# Patient Record
Sex: Female | Born: 1975
Health system: Southern US, Community
[De-identification: ages and names within clinical notes are randomized; demographics above are authoritative.]

## PROBLEM LIST (undated history)

## (undated) ENCOUNTER — Ambulatory Visit (HOSPITAL_COMMUNITY): Admission: EM | Payer: BC Managed Care – PPO

## (undated) DIAGNOSIS — Z8719 Personal history of other diseases of the digestive system: Secondary | ICD-10-CM

## (undated) DIAGNOSIS — D649 Anemia, unspecified: Secondary | ICD-10-CM

## (undated) DIAGNOSIS — R7881 Bacteremia: Secondary | ICD-10-CM

## (undated) DIAGNOSIS — R011 Cardiac murmur, unspecified: Secondary | ICD-10-CM

## (undated) DIAGNOSIS — D759 Disease of blood and blood-forming organs, unspecified: Secondary | ICD-10-CM

## (undated) DIAGNOSIS — Z972 Presence of dental prosthetic device (complete) (partial): Secondary | ICD-10-CM

## (undated) DIAGNOSIS — J45909 Unspecified asthma, uncomplicated: Secondary | ICD-10-CM

## (undated) DIAGNOSIS — N159 Renal tubulo-interstitial disease, unspecified: Secondary | ICD-10-CM

## (undated) DIAGNOSIS — K219 Gastro-esophageal reflux disease without esophagitis: Secondary | ICD-10-CM

## (undated) DIAGNOSIS — I10 Essential (primary) hypertension: Secondary | ICD-10-CM

## (undated) DIAGNOSIS — N12 Tubulo-interstitial nephritis, not specified as acute or chronic: Secondary | ICD-10-CM

## (undated) HISTORY — PX: ABDOMINAL HERNIA REPAIR: SHX539

---

## 1999-12-07 ENCOUNTER — Inpatient Hospital Stay (HOSPITAL_COMMUNITY): Admission: EM | Admit: 1999-12-07 | Discharge: 1999-12-07 | Payer: Self-pay | Admitting: Obstetrics & Gynecology

## 2000-08-08 ENCOUNTER — Emergency Department (HOSPITAL_COMMUNITY): Admission: EM | Admit: 2000-08-08 | Discharge: 2000-08-08 | Payer: Self-pay | Admitting: Emergency Medicine

## 2000-08-14 ENCOUNTER — Emergency Department (HOSPITAL_COMMUNITY): Admission: EM | Admit: 2000-08-14 | Discharge: 2000-08-14 | Payer: Self-pay | Admitting: Emergency Medicine

## 2000-09-07 ENCOUNTER — Emergency Department (HOSPITAL_COMMUNITY): Admission: EM | Admit: 2000-09-07 | Discharge: 2000-09-07 | Payer: Self-pay

## 2000-10-24 ENCOUNTER — Emergency Department (HOSPITAL_COMMUNITY): Admission: EM | Admit: 2000-10-24 | Discharge: 2000-10-24 | Payer: Self-pay | Admitting: Emergency Medicine

## 2001-02-19 ENCOUNTER — Emergency Department (HOSPITAL_COMMUNITY): Admission: EM | Admit: 2001-02-19 | Discharge: 2001-02-19 | Payer: Self-pay

## 2001-03-24 ENCOUNTER — Emergency Department (HOSPITAL_COMMUNITY): Admission: EM | Admit: 2001-03-24 | Discharge: 2001-03-24 | Payer: Self-pay

## 2001-04-22 ENCOUNTER — Encounter: Payer: Self-pay | Admitting: Internal Medicine

## 2001-04-22 ENCOUNTER — Emergency Department (HOSPITAL_COMMUNITY): Admission: EM | Admit: 2001-04-22 | Discharge: 2001-04-22 | Payer: Self-pay | Admitting: Emergency Medicine

## 2001-05-03 ENCOUNTER — Encounter: Payer: Self-pay | Admitting: Orthopedic Surgery

## 2001-05-03 ENCOUNTER — Ambulatory Visit (HOSPITAL_COMMUNITY): Admission: RE | Admit: 2001-05-03 | Discharge: 2001-05-03 | Payer: Self-pay | Admitting: Orthopedic Surgery

## 2001-05-13 ENCOUNTER — Encounter: Admission: RE | Admit: 2001-05-13 | Discharge: 2001-07-07 | Payer: Self-pay | Admitting: Orthopedic Surgery

## 2001-09-27 ENCOUNTER — Inpatient Hospital Stay (HOSPITAL_COMMUNITY): Admission: AD | Admit: 2001-09-27 | Discharge: 2001-09-27 | Payer: Self-pay | Admitting: Obstetrics and Gynecology

## 2001-10-02 ENCOUNTER — Other Ambulatory Visit: Admission: RE | Admit: 2001-10-02 | Discharge: 2001-10-02 | Payer: Self-pay | Admitting: Obstetrics and Gynecology

## 2001-10-24 ENCOUNTER — Encounter: Payer: Self-pay | Admitting: Obstetrics and Gynecology

## 2001-10-24 ENCOUNTER — Ambulatory Visit (HOSPITAL_COMMUNITY): Admission: RE | Admit: 2001-10-24 | Discharge: 2001-10-24 | Payer: Self-pay | Admitting: Obstetrics and Gynecology

## 2002-01-31 ENCOUNTER — Inpatient Hospital Stay (HOSPITAL_COMMUNITY): Admission: AD | Admit: 2002-01-31 | Discharge: 2002-01-31 | Payer: Self-pay | Admitting: Obstetrics and Gynecology

## 2002-03-03 ENCOUNTER — Encounter (HOSPITAL_COMMUNITY): Admission: RE | Admit: 2002-03-03 | Discharge: 2002-04-02 | Payer: Self-pay | Admitting: Obstetrics and Gynecology

## 2002-03-04 ENCOUNTER — Encounter: Payer: Self-pay | Admitting: Obstetrics and Gynecology

## 2002-03-23 ENCOUNTER — Encounter: Payer: Self-pay | Admitting: Obstetrics and Gynecology

## 2002-04-03 ENCOUNTER — Encounter: Payer: Self-pay | Admitting: Obstetrics and Gynecology

## 2002-04-03 ENCOUNTER — Encounter (HOSPITAL_COMMUNITY): Admission: RE | Admit: 2002-04-03 | Discharge: 2002-04-03 | Payer: Self-pay | Admitting: Obstetrics and Gynecology

## 2002-04-05 ENCOUNTER — Encounter: Payer: Self-pay | Admitting: Obstetrics and Gynecology

## 2002-04-05 ENCOUNTER — Inpatient Hospital Stay (HOSPITAL_COMMUNITY): Admission: AD | Admit: 2002-04-05 | Discharge: 2002-04-05 | Payer: Self-pay | Admitting: Obstetrics and Gynecology

## 2002-04-14 ENCOUNTER — Inpatient Hospital Stay (HOSPITAL_COMMUNITY): Admission: AD | Admit: 2002-04-14 | Discharge: 2002-04-17 | Payer: Self-pay | Admitting: Obstetrics and Gynecology

## 2002-05-04 ENCOUNTER — Encounter: Payer: Self-pay | Admitting: Emergency Medicine

## 2002-05-04 ENCOUNTER — Emergency Department (HOSPITAL_COMMUNITY): Admission: EM | Admit: 2002-05-04 | Discharge: 2002-05-04 | Payer: Self-pay | Admitting: Emergency Medicine

## 2002-05-12 ENCOUNTER — Other Ambulatory Visit: Admission: RE | Admit: 2002-05-12 | Discharge: 2002-05-12 | Payer: Self-pay | Admitting: Obstetrics and Gynecology

## 2003-03-24 ENCOUNTER — Emergency Department (HOSPITAL_COMMUNITY): Admission: EM | Admit: 2003-03-24 | Discharge: 2003-03-25 | Payer: Self-pay | Admitting: Emergency Medicine

## 2003-06-04 ENCOUNTER — Encounter: Payer: Self-pay | Admitting: General Practice

## 2003-06-04 ENCOUNTER — Encounter: Admission: RE | Admit: 2003-06-04 | Discharge: 2003-06-04 | Payer: Self-pay | Admitting: General Practice

## 2003-06-14 ENCOUNTER — Other Ambulatory Visit: Admission: RE | Admit: 2003-06-14 | Discharge: 2003-06-14 | Payer: Self-pay | Admitting: Obstetrics and Gynecology

## 2003-08-14 ENCOUNTER — Emergency Department (HOSPITAL_COMMUNITY): Admission: AD | Admit: 2003-08-14 | Discharge: 2003-08-14 | Payer: Self-pay | Admitting: Family Medicine

## 2003-10-24 ENCOUNTER — Emergency Department (HOSPITAL_COMMUNITY): Admission: EM | Admit: 2003-10-24 | Discharge: 2003-10-24 | Payer: Self-pay | Admitting: Emergency Medicine

## 2004-01-25 ENCOUNTER — Emergency Department (HOSPITAL_COMMUNITY): Admission: EM | Admit: 2004-01-25 | Discharge: 2004-01-25 | Payer: Self-pay

## 2004-06-16 ENCOUNTER — Emergency Department (HOSPITAL_COMMUNITY): Admission: EM | Admit: 2004-06-16 | Discharge: 2004-06-16 | Payer: Self-pay | Admitting: Emergency Medicine

## 2004-06-23 ENCOUNTER — Other Ambulatory Visit: Admission: RE | Admit: 2004-06-23 | Discharge: 2004-06-23 | Payer: Self-pay | Admitting: Obstetrics and Gynecology

## 2004-09-09 ENCOUNTER — Emergency Department (HOSPITAL_COMMUNITY): Admission: EM | Admit: 2004-09-09 | Discharge: 2004-09-09 | Payer: Self-pay | Admitting: Emergency Medicine

## 2004-10-31 ENCOUNTER — Emergency Department (HOSPITAL_COMMUNITY): Admission: EM | Admit: 2004-10-31 | Discharge: 2004-10-31 | Payer: Self-pay | Admitting: Emergency Medicine

## 2004-11-14 ENCOUNTER — Emergency Department (HOSPITAL_COMMUNITY): Admission: EM | Admit: 2004-11-14 | Discharge: 2004-11-14 | Payer: Self-pay | Admitting: Emergency Medicine

## 2005-01-31 ENCOUNTER — Emergency Department (HOSPITAL_COMMUNITY): Admission: EM | Admit: 2005-01-31 | Discharge: 2005-01-31 | Payer: Self-pay | Admitting: Emergency Medicine

## 2005-02-01 ENCOUNTER — Encounter: Payer: Self-pay | Admitting: Emergency Medicine

## 2005-02-01 IMAGING — US US OB COMP LESS 14 WK
1 series · 14 of 28 positions shown · non-contrast
Comparison: none

CLINICAL DATA: Lower pelvic pain. Positive beta-hCG with a quantitative beta-hCG
of 29. 

OB ULTRASOUND LESS THAN 14 WEEKS TRANSABDOMINAL AND TRANSVAGINAL:

[Series 1: unknown · 0.28mm/px · 14 of 47 slices shown]
[im 2/47]
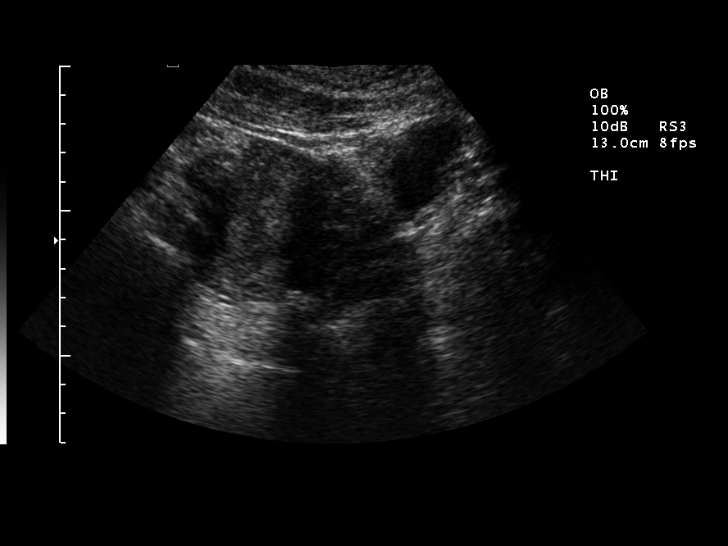
[im 6/47]
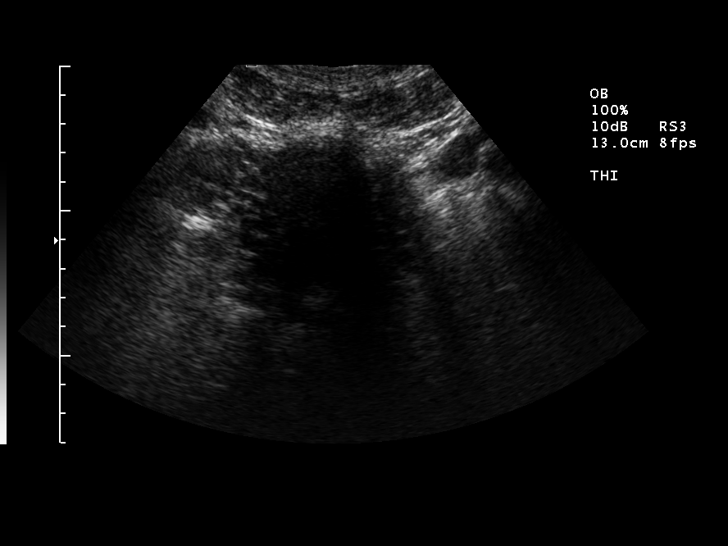
[im 9/47]
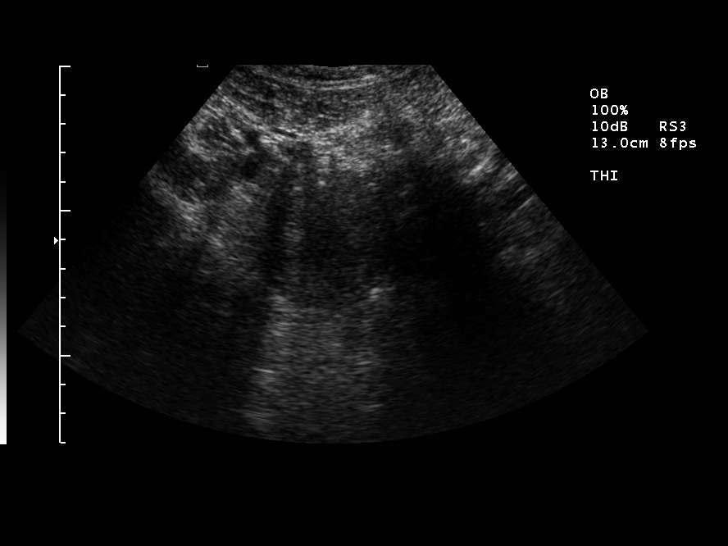
[im 12/47]
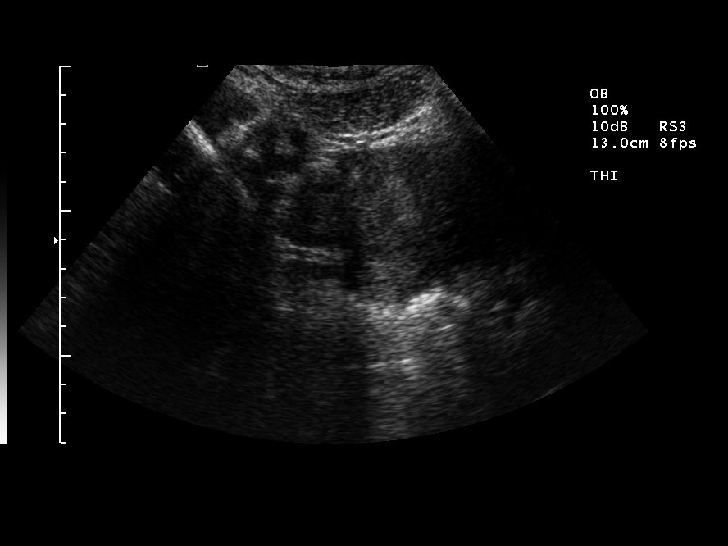
[im 16/47]
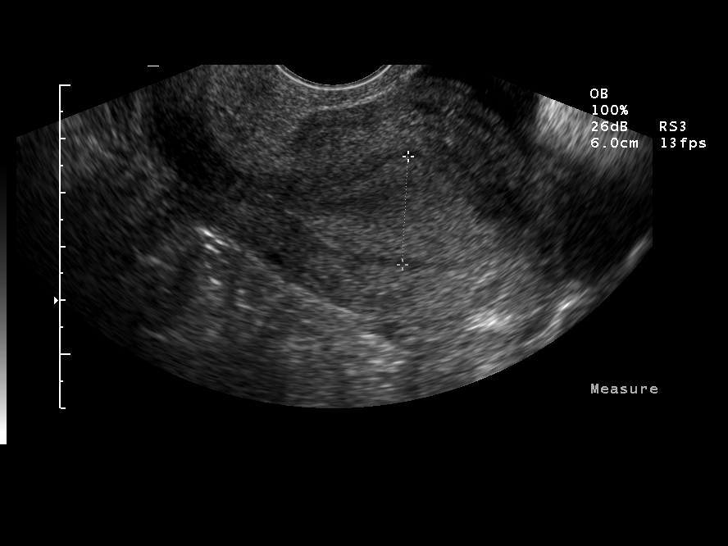
[im 19/47]
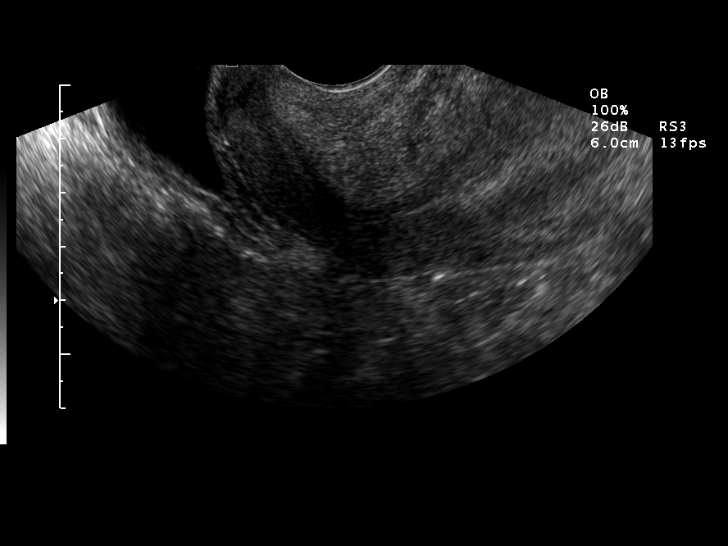
[im 23/47]
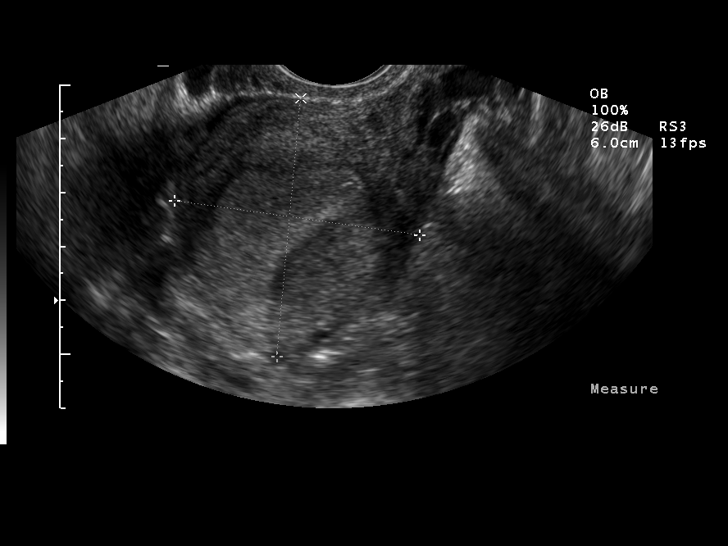
[im 26/47]
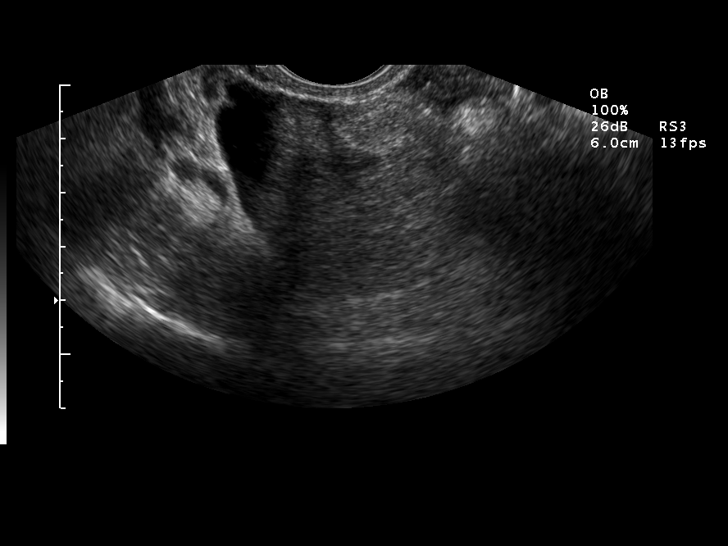
[im 29/47]
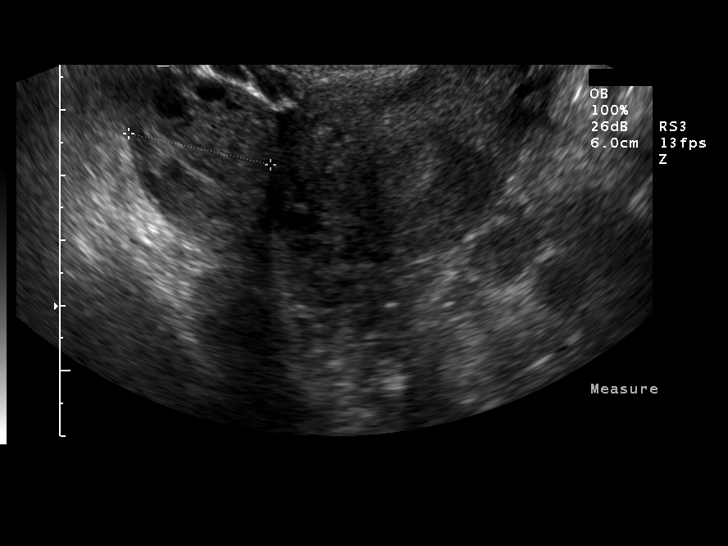
[im 33/47]
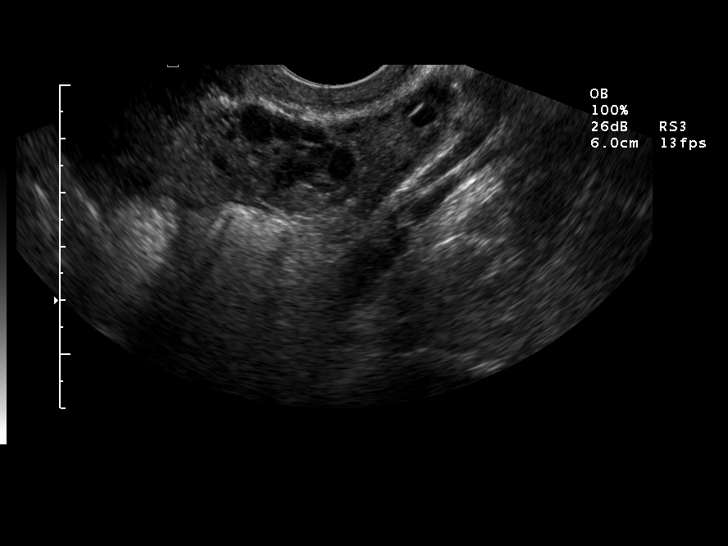
[im 36/47]
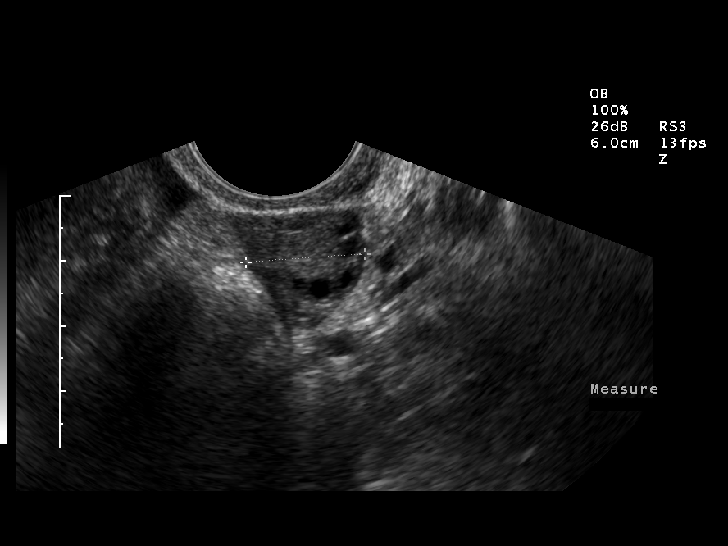
[im 40/47]
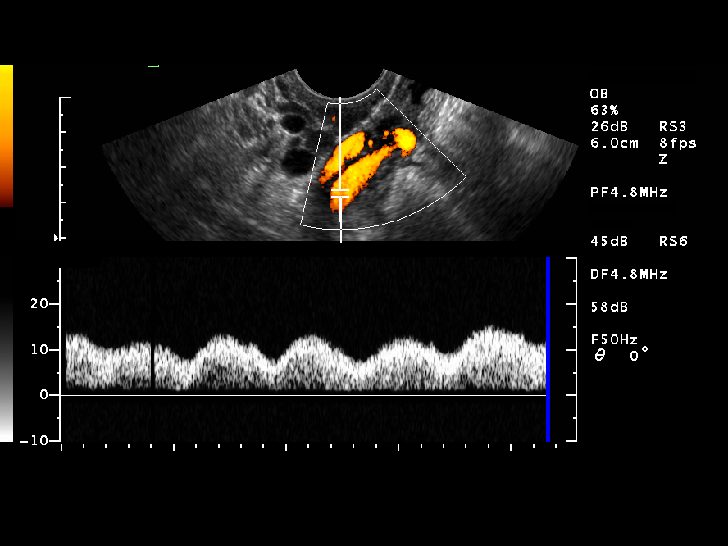
[im 43/47]
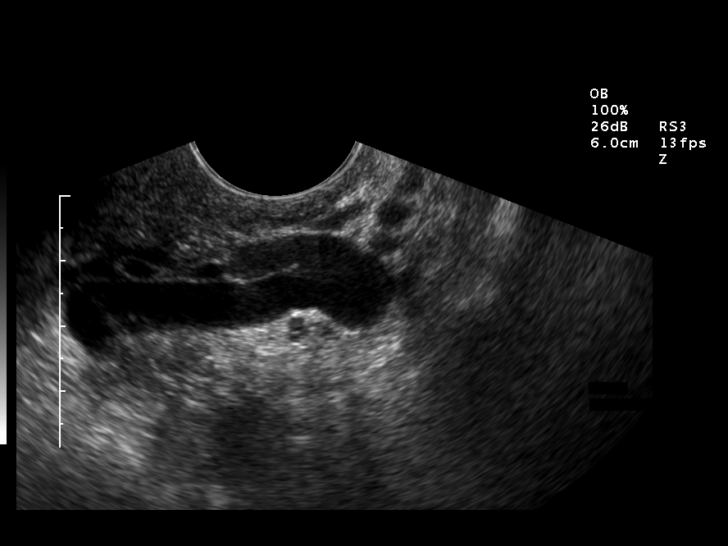
[im 47/47]
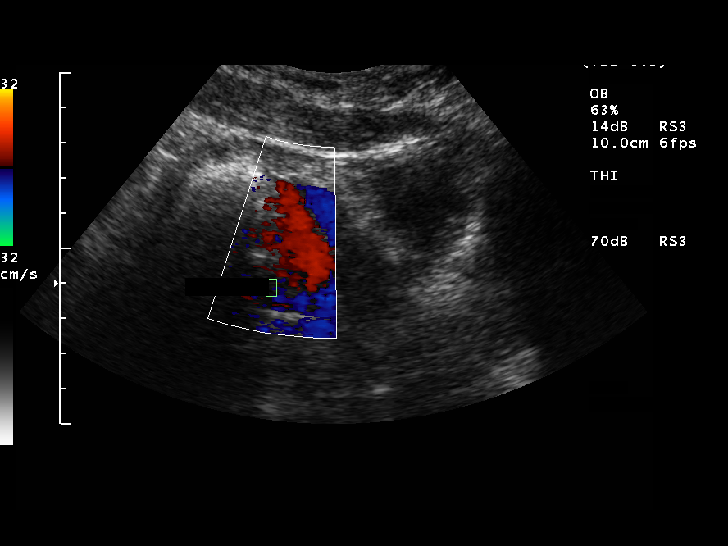

[14 of 28 positions shown; findings below may reference images not displayed]

FINDINGS: No intrauterine pregnancy identified. The endometrium is thickened at
20 mm.

Both ovaries are unremarkable. Within the left adnexal region, there are dilated
venous structures. There is reversal of flow within these vessels and increased
flow during Valsalva.

A small amount of free fluid is noted in the pelvis.
IMPRESSION: No intrauterine pregnancy identified. Prominent endometrium at 20 mm. Given the
very low quantitative beta-hCG, pregnancy may be too early to visualize
sonographically. Recommend close interval followup with serial beta-hCG's and
ultrasounds.

Dilated left paraovarian veins. These increase in size and show reversal flow on
Valsalva, compatible with varicocele, suggesting the possibility of pelvic
congestion syndrome.

## 2005-02-04 ENCOUNTER — Inpatient Hospital Stay (HOSPITAL_COMMUNITY): Admission: AD | Admit: 2005-02-04 | Discharge: 2005-02-04 | Payer: Self-pay | Admitting: Obstetrics and Gynecology

## 2005-03-05 ENCOUNTER — Other Ambulatory Visit: Admission: RE | Admit: 2005-03-05 | Discharge: 2005-03-05 | Payer: Self-pay | Admitting: Obstetrics and Gynecology

## 2005-08-29 ENCOUNTER — Ambulatory Visit (HOSPITAL_COMMUNITY): Admission: AD | Admit: 2005-08-29 | Discharge: 2005-08-29 | Payer: Self-pay | Admitting: Obstetrics and Gynecology

## 2005-08-29 IMAGING — US US FETAL BPP W/O NONSTRESS
1 series · 14 of 18 positions shown · non-contrast
Comparison: none

CLINICAL DATA: Non-reactive NST in office today.

[Series 1: us fetal bpp w/o nonstress · 0.29mm/px · 14 of 18 slices shown]
[im 1/18]
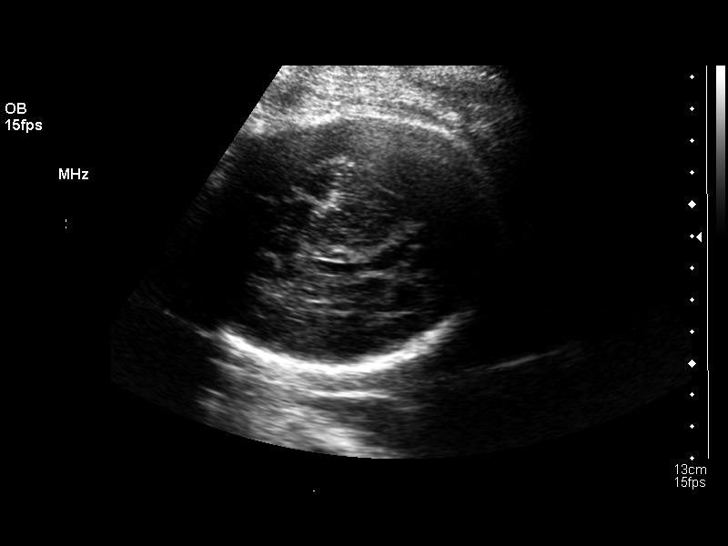
[im 2/18]
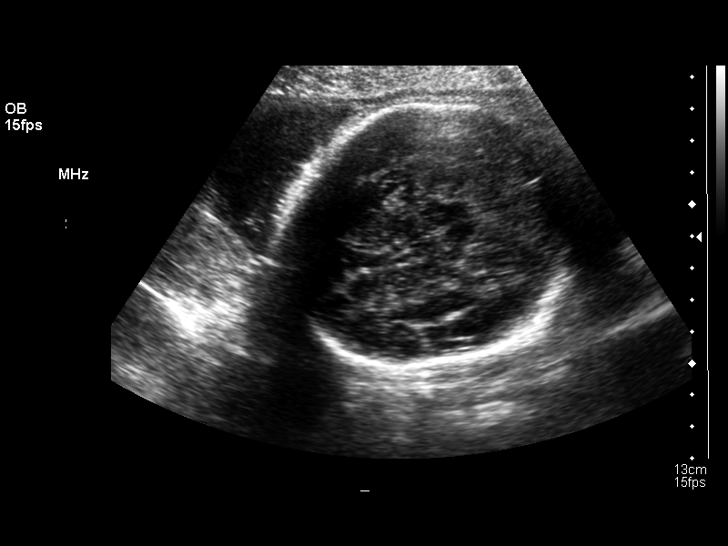
[im 4/18]
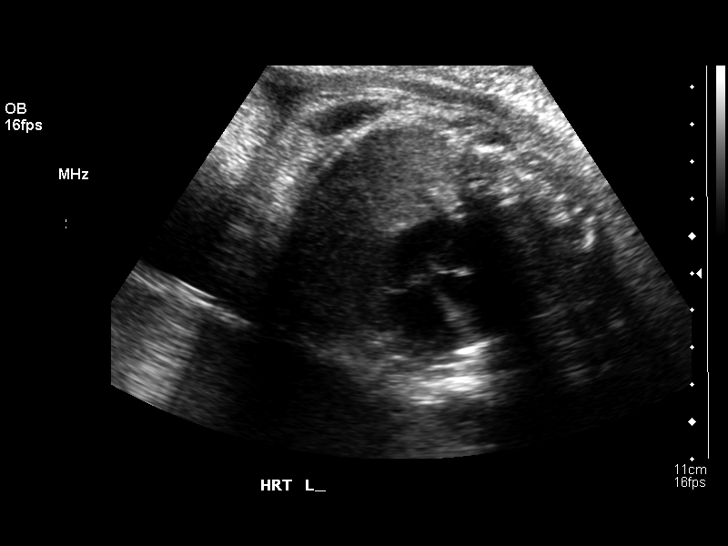
[im 5/18]
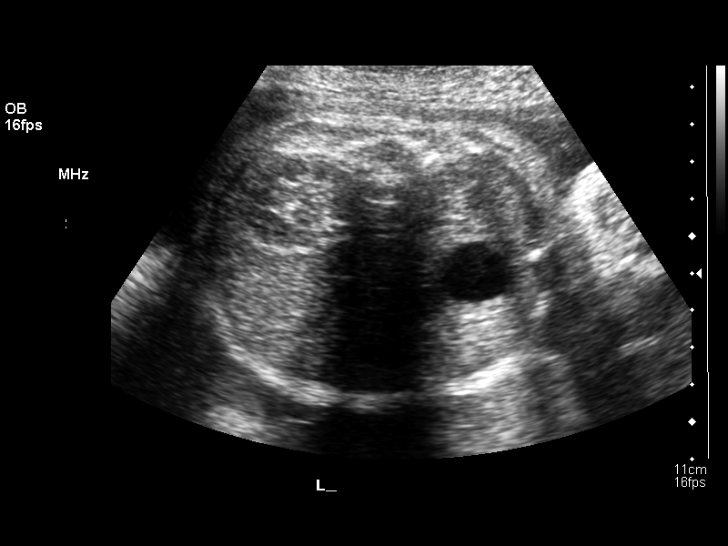
[im 6/18]
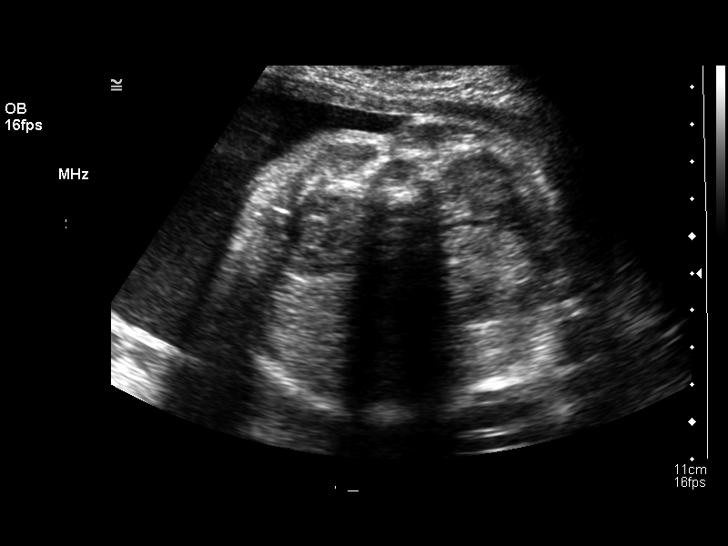
[im 8/18]
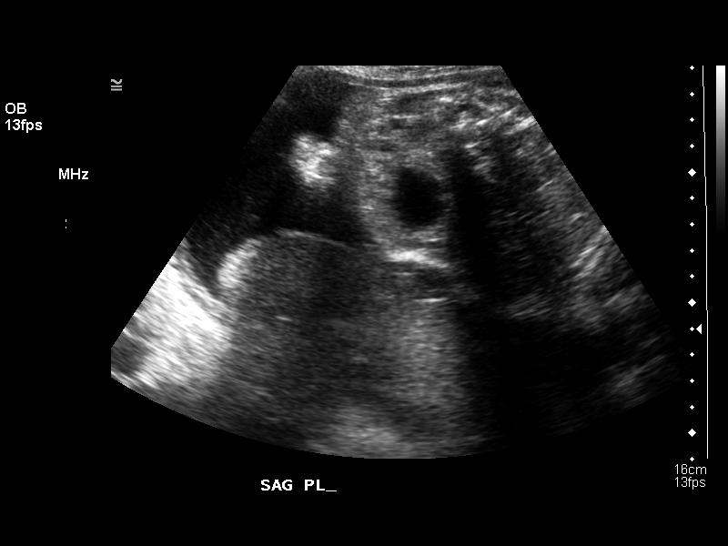
[im 9/18]
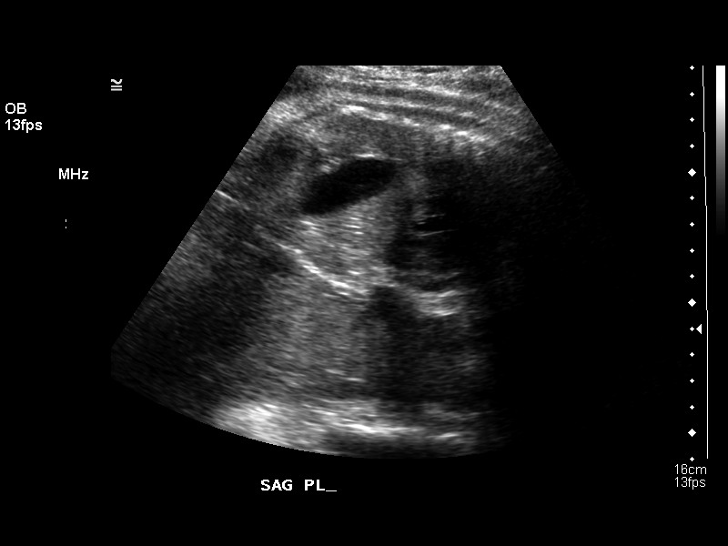
[im 10/18]
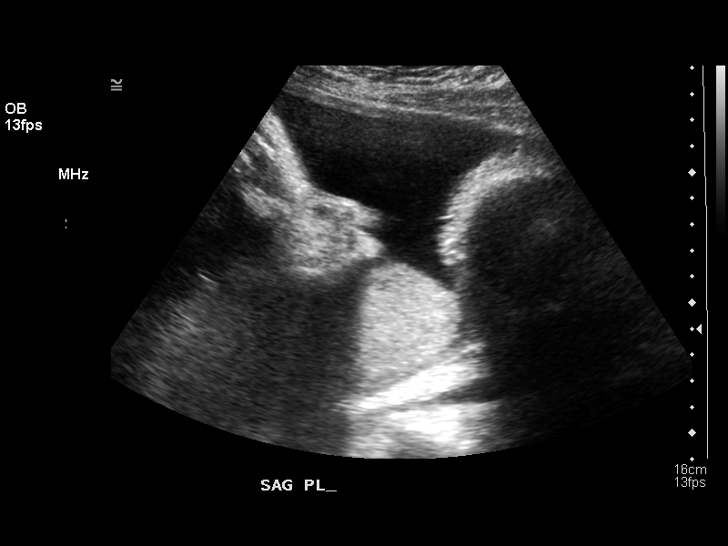
[im 11/18]
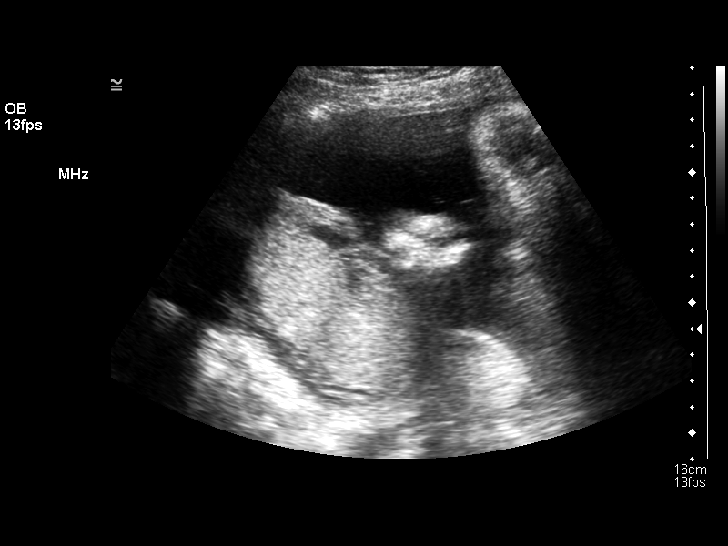
[im 13/18]
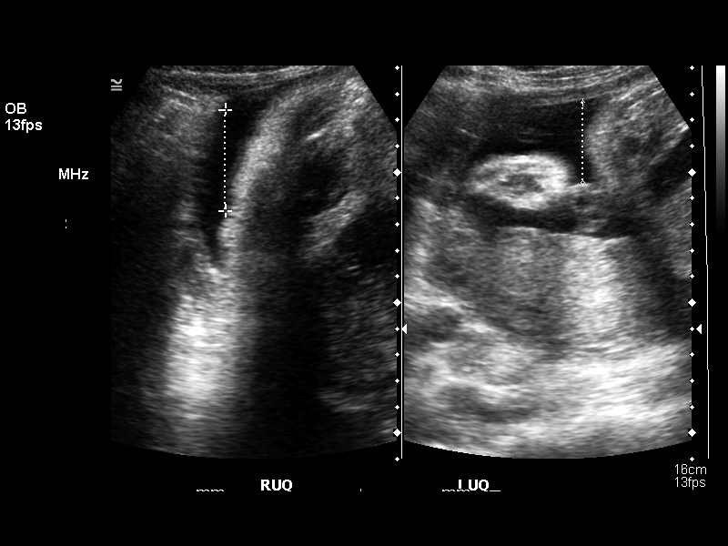
[im 14/18]
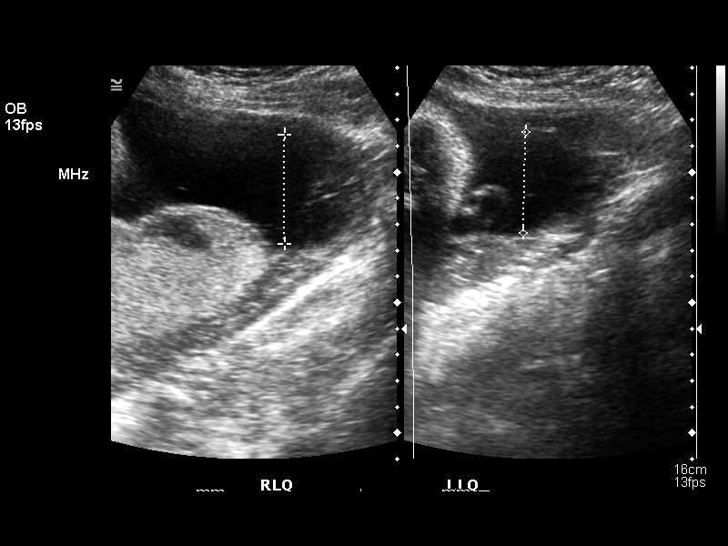
[im 15/18]
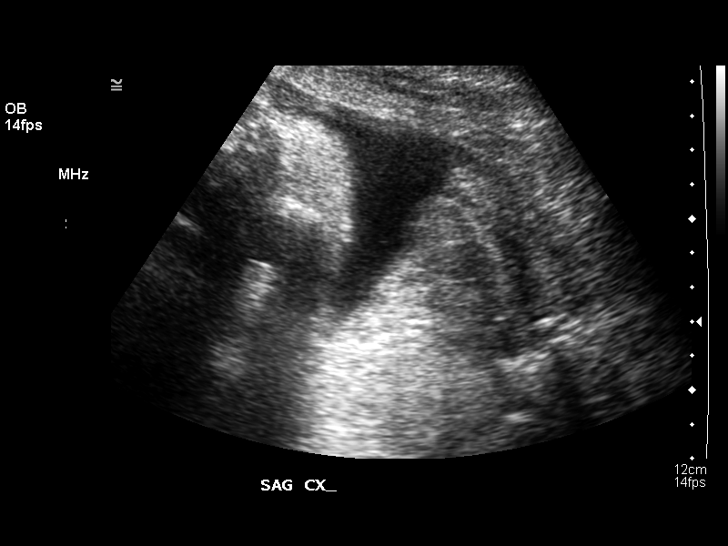
[im 17/18]
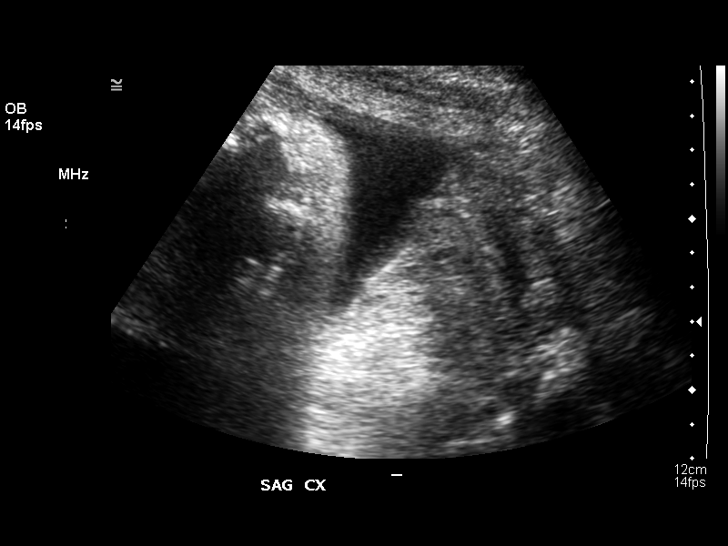
[im 18/18]
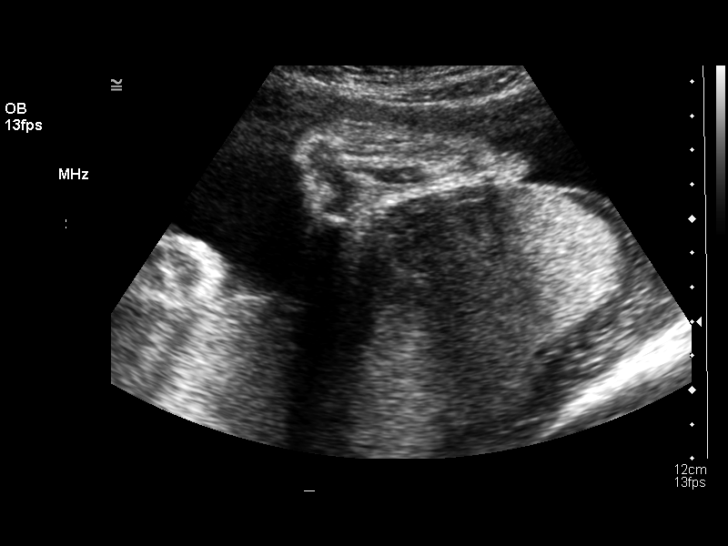

[14 of 18 positions shown; findings below may reference images not displayed]

BIOPHYSICAL PROFILE

 Number of Fetuses:  1
 Heart rate:  133
 Movement:  Yes
 Breathing:  Yes
 Presentation:  Cephalic
 Placental Location:  Posterior
 Grade:  II
 Previa:  No
 Amniotic Fluid (Subjective):  Normal
 Amniotic Fluid (Objective):  15.0 cm AFI (5th -95th%ile = 8.1 ? 24.8 cm for 34 wks)

 Fetal measurements and complete anatomic evaluation were not requested.  The following fetal anatomy was visualized on this exam:  Lateral ventricles, thalami, four chamber heart, stomach, kidneys and bladder.  

 BPP SCORING
 Movements:  2   Time:  15 minutes
 Breathing:  2
 Tone:  2
 Amniotic Fluid:  2
 Total Score:  8

 MATERNAL UTERINE AND ADNEXAL FINDINGS
 Cervix:  4.5 cm Transabdominally
IMPRESSION: 1.  Single live intrauterine gestation in cephalic presentation with normal amniotic fluid volume.  
 2.  Fetal biophysical profile score [DATE] in 15 minutes.
 3.  Fetal measurements and morphologic survey not requested, not performed.

## 2005-09-06 ENCOUNTER — Inpatient Hospital Stay (HOSPITAL_COMMUNITY): Admission: AD | Admit: 2005-09-06 | Discharge: 2005-09-06 | Payer: Self-pay | Admitting: Obstetrics and Gynecology

## 2005-09-06 IMAGING — US US FETAL BPP W/O NONSTRESS
1 series · 18 of 27 positions shown · non-contrast
Comparison: none

CLINICAL DATA: 35 weeks estimated gestational age with nonreactive NST.  Assess fetal well-being.

[Series 1: us fetal bpp w/o nonstress · non-contrast · 18 of 27 slices shown]
[im 1/27]
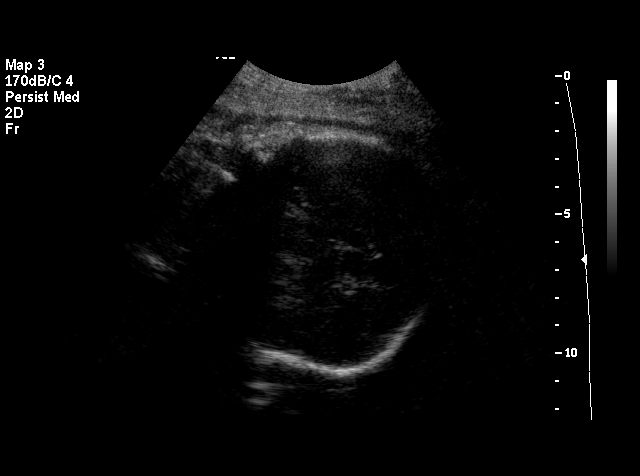
[im 3/27]
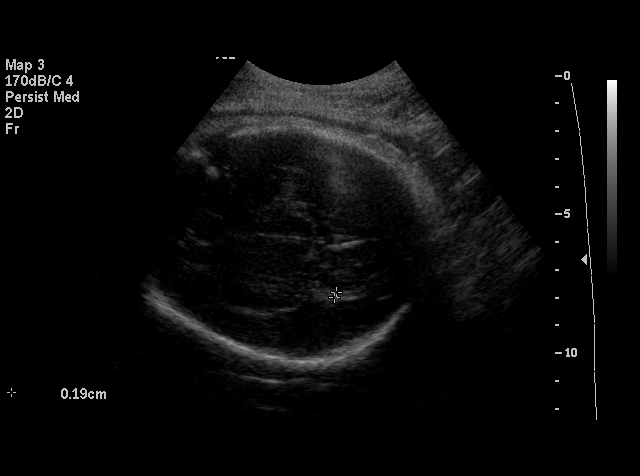
[im 4/27]
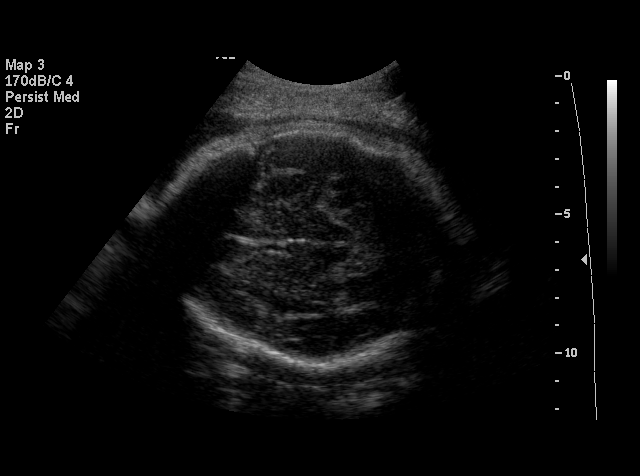
[im 6/27]
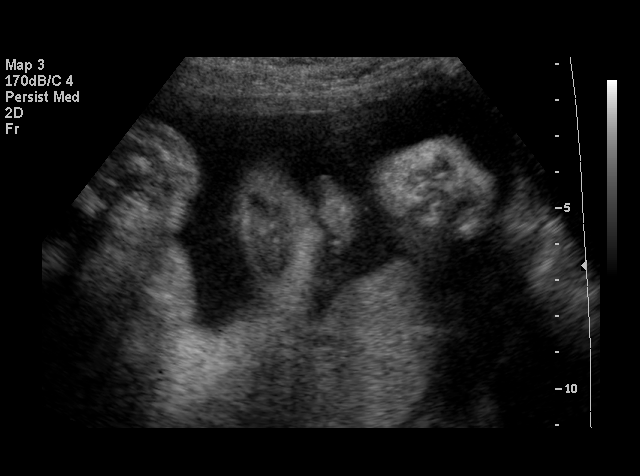
[im 7/27]
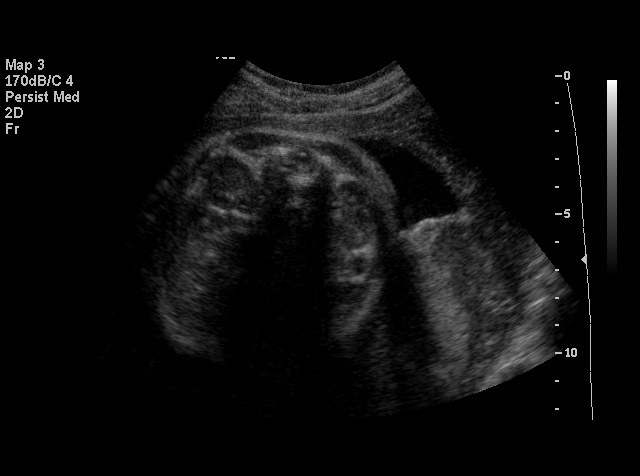
[im 9/27]
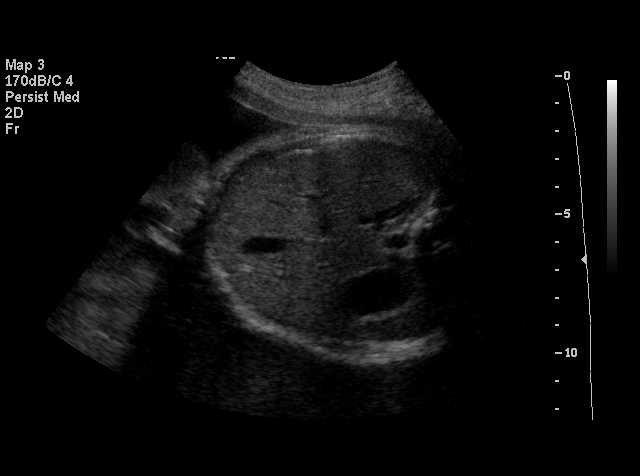
[im 10/27]
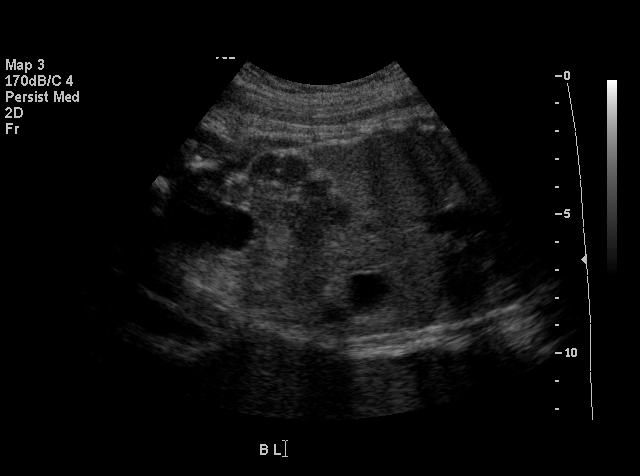
[im 12/27]
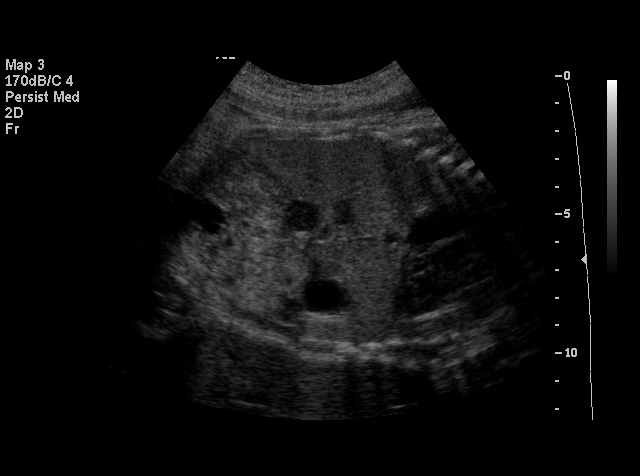
[im 13/27]
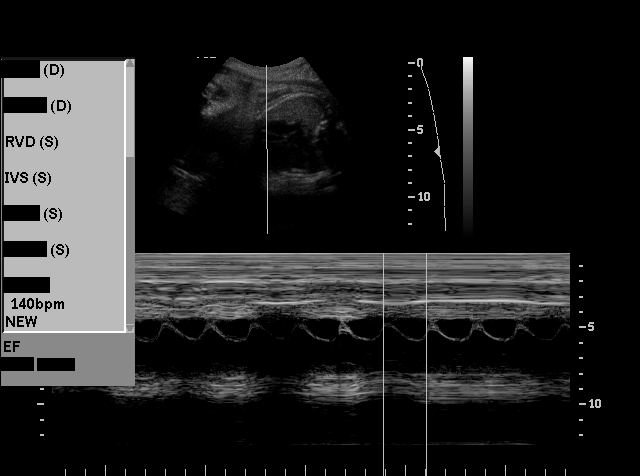
[im 15/27]
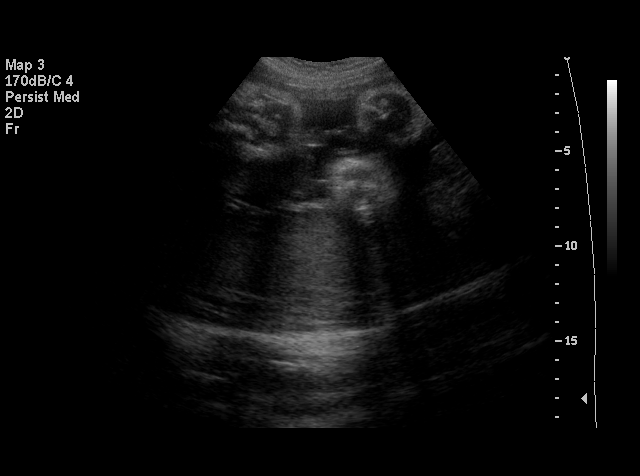
[im 16/27]
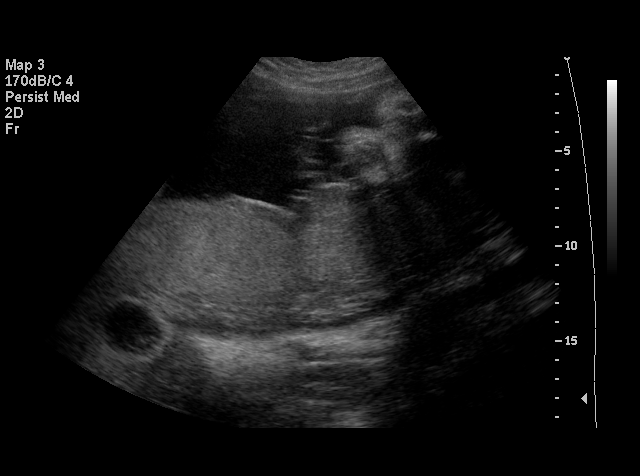
[im 18/27]
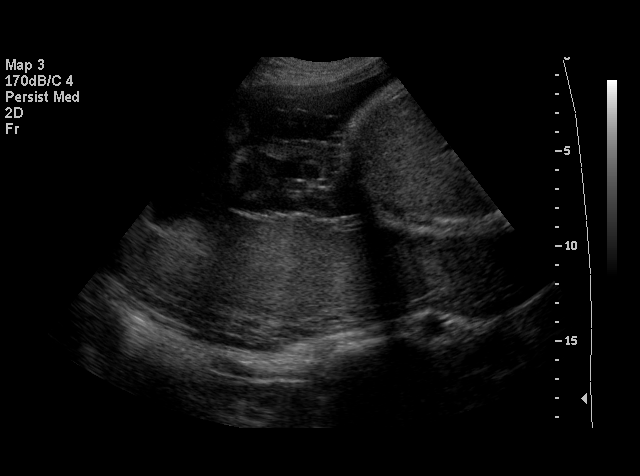
[im 19/27]
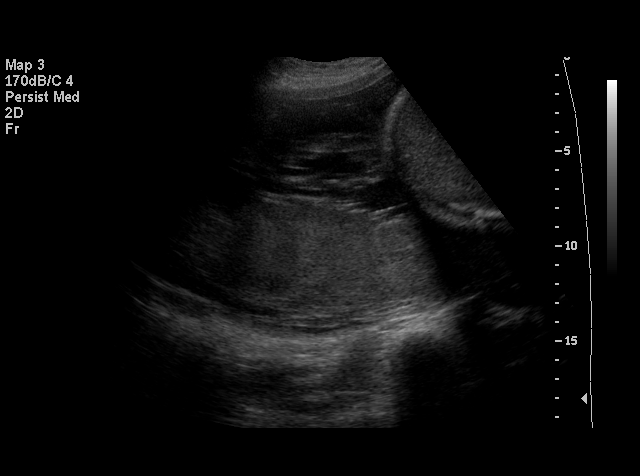
[im 21/27]
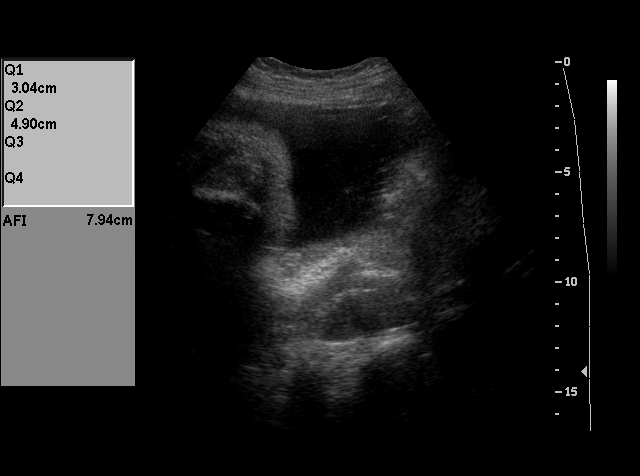
[im 22/27]
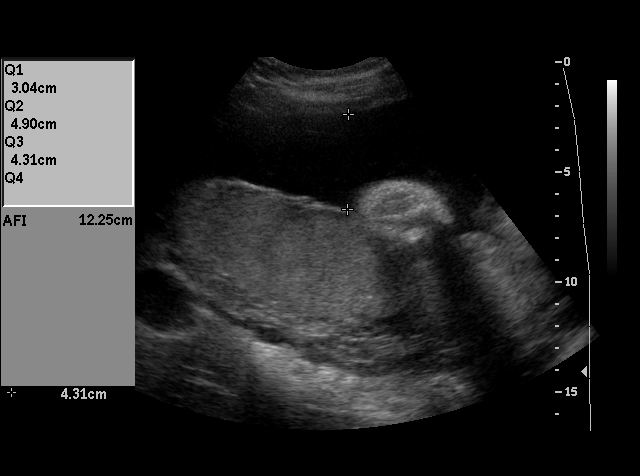
[im 24/27]
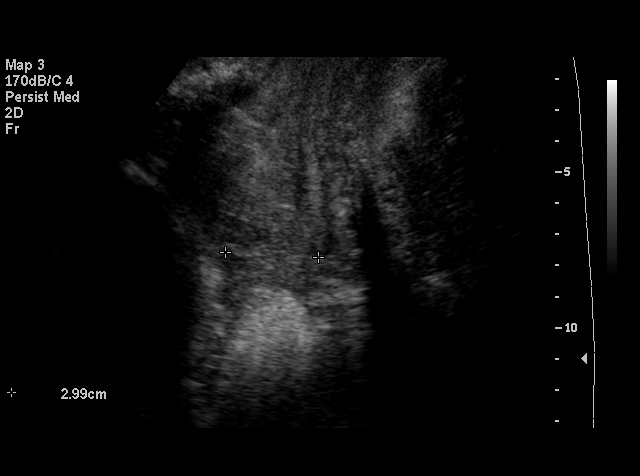
[im 25/27]
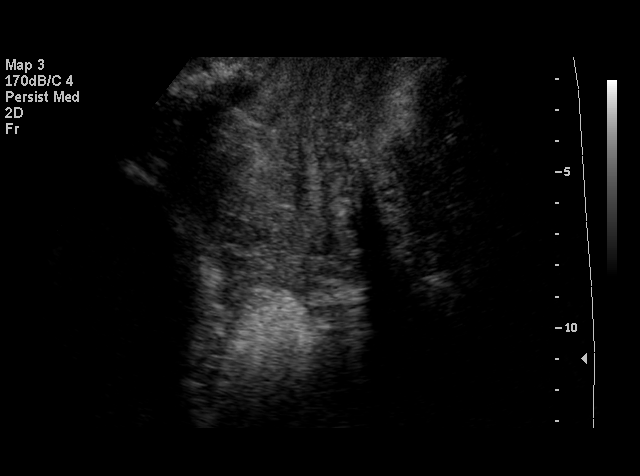
[im 27/27]
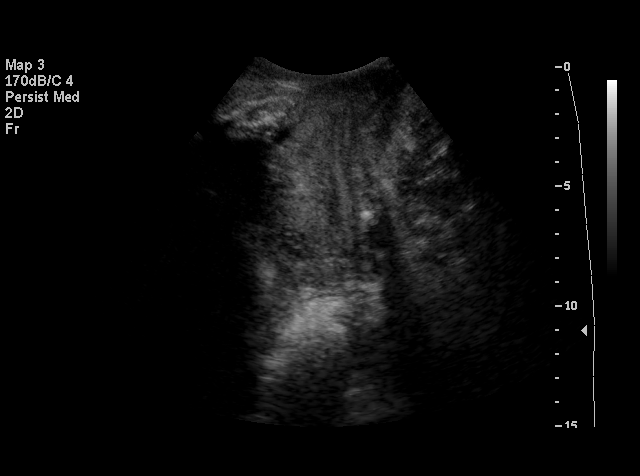

[18 of 27 positions shown; findings below may reference images not displayed]

BIOPHYSICAL PROFILE

Number of Fetuses:  1
Heart rate:  140
Movement:  Yes
Breathing:  Yes
Presentation:  Cephalic
Placental Location:  Posterior
Grade:  I
Previa:  No
Amniotic Fluid (Subjective):  Normal
Amniotic Fluid (Objective):  15.1 cm AFI (5th -95th%ile = 7.9 ? 24.9 cm for 35 wks)

Fetal measurements and complete anatomic evaluation were not requested.  The following fetal anatomy was visualized on this exam:  Lateral ventricles, thalami, stomach, kidneys and bladder.

BPP SCORING
Movements:  2  Time:  15 minutes
Breathing:  2
Tone:  2
Amniotic Fluid:  2
Total Score:  8

MATERNAL UTERINE AND ADNEXAL FINDINGS
Cervix:  3.0 cm Translabially
IMPRESSION: 1.  Biophysical profile score [DATE].
2.  Subjectively and quantitatively normal amniotic fluid volume and normal cervical length.
3.  No late developing fetal anatomic abnormalities are identified associated with the lateral ventricles, stomach, kidneys or bladder.  A four chamber heart view could not be reassessed due to positioning on today?s exam.

## 2005-09-13 ENCOUNTER — Ambulatory Visit (HOSPITAL_COMMUNITY): Admission: RE | Admit: 2005-09-13 | Discharge: 2005-09-13 | Payer: Self-pay | Admitting: Obstetrics and Gynecology

## 2005-09-13 IMAGING — US US FETAL BPP W/O NONSTRESS
1 series · 14 of 25 positions shown · non-contrast
Comparison: none

CLINICAL DATA: Assess fetal well-being.

[Series 1: us fetal bpp w/o nonstress · 0.33mm/px · 25 acquisitions, 14 frames shown]
[im 1/25]
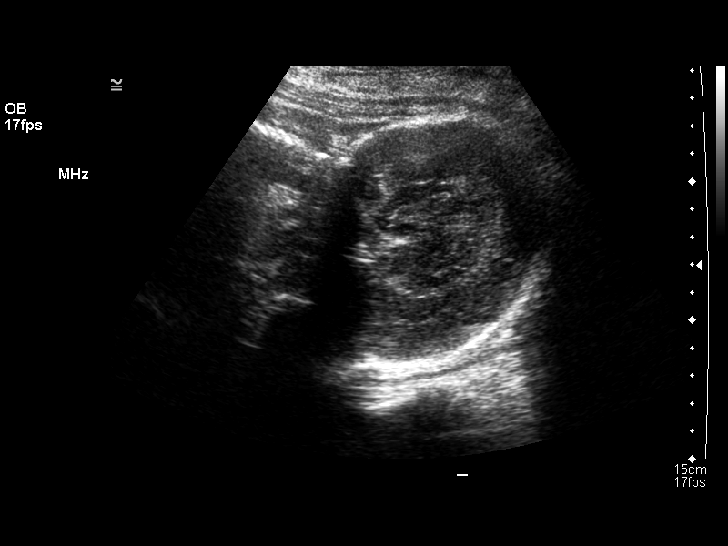
[im 3/25]
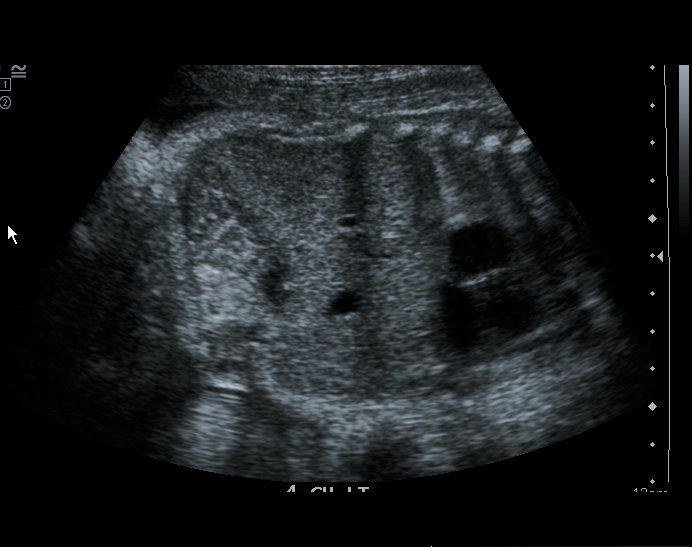
[im 5/25]
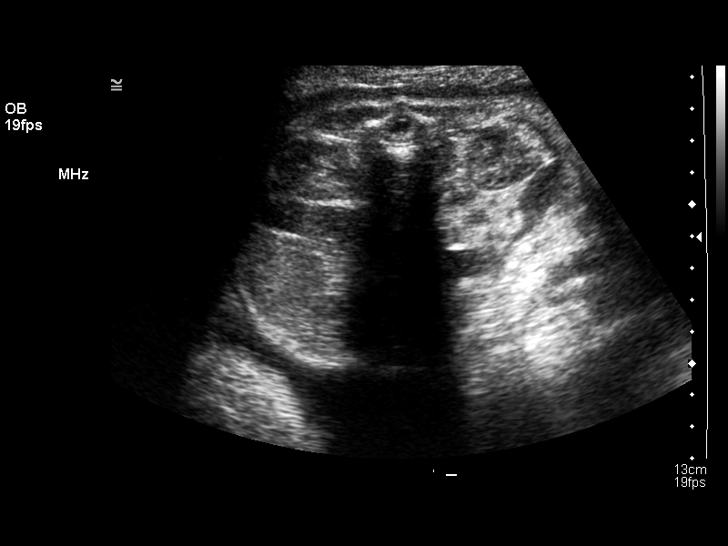
[im 7/25]
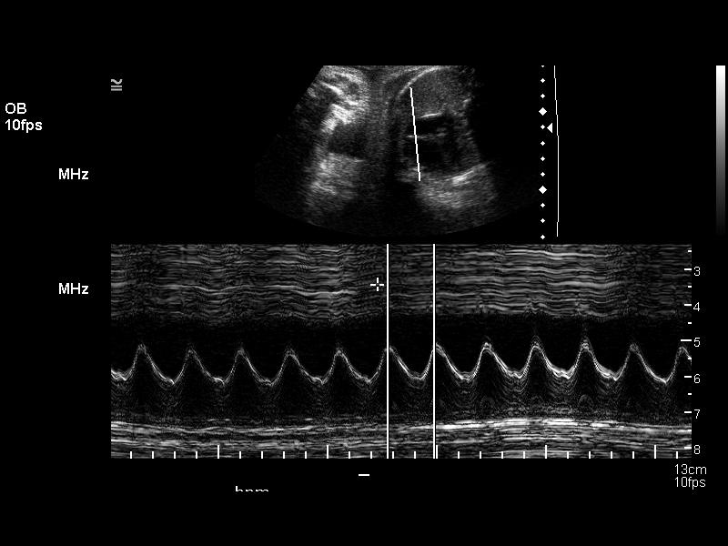
[im 9/25]
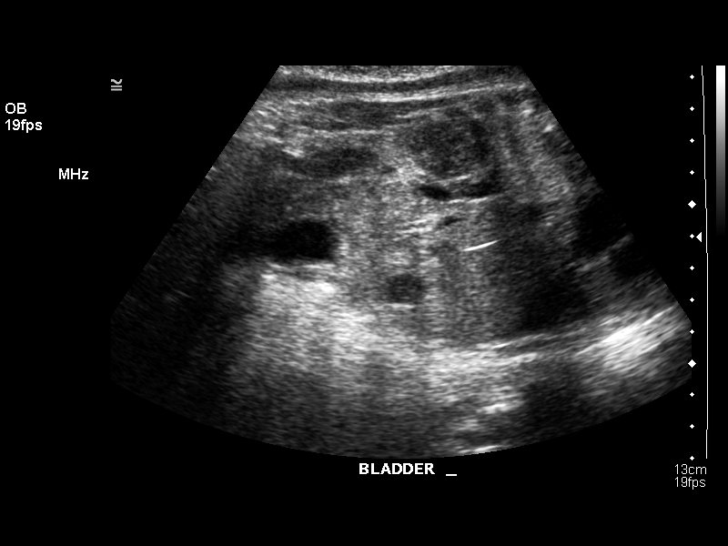
[im 10/25]
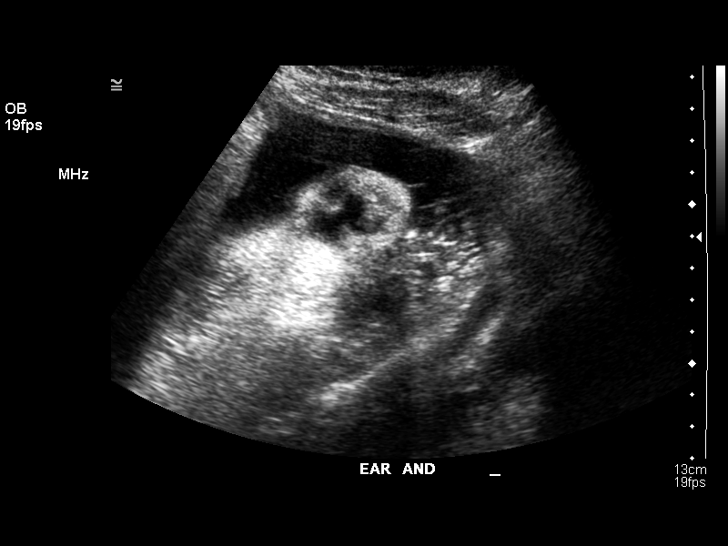
[im 12/25]
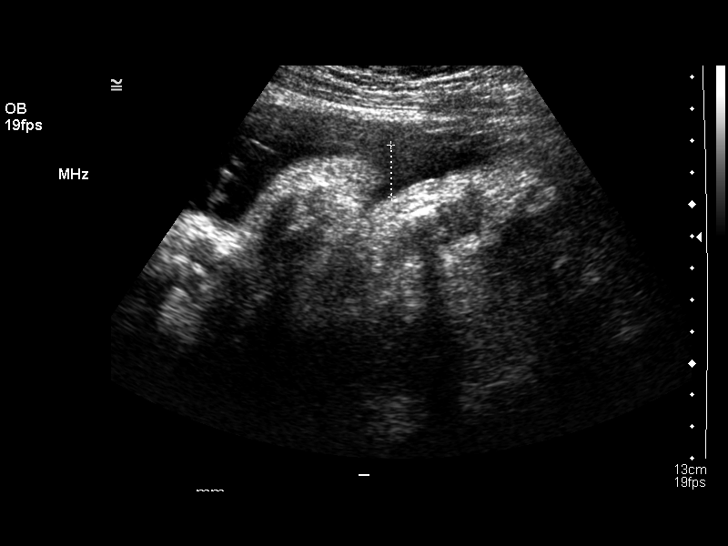
[im 14/25]
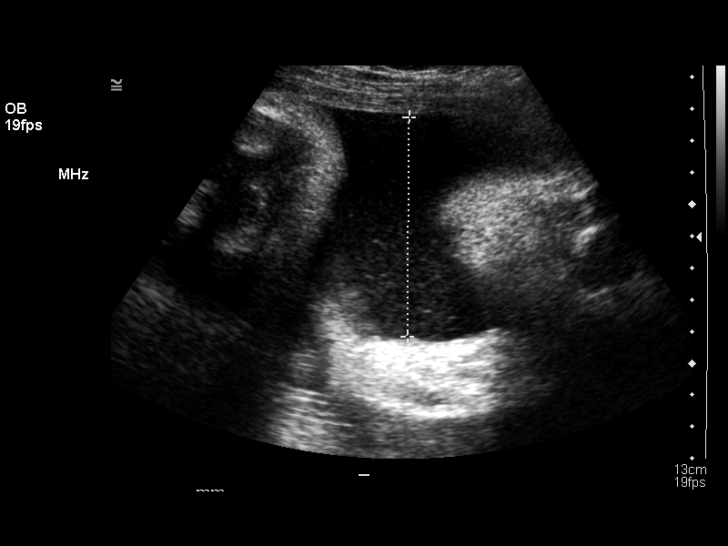
[im 16/25]
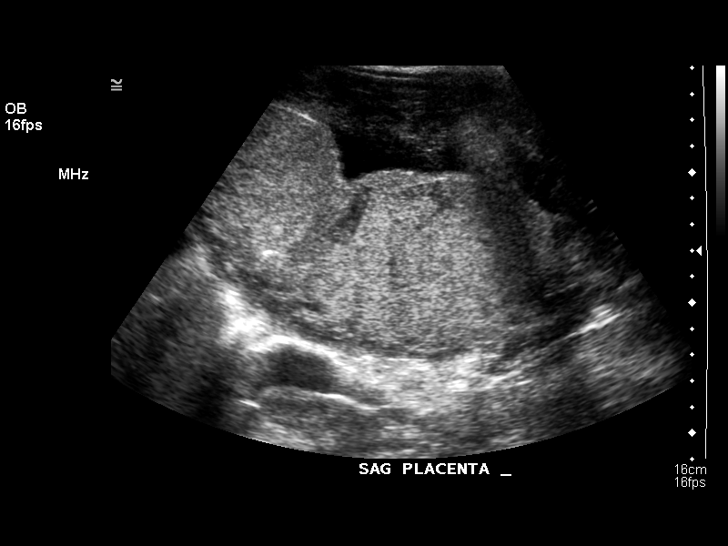
[im 17/25]
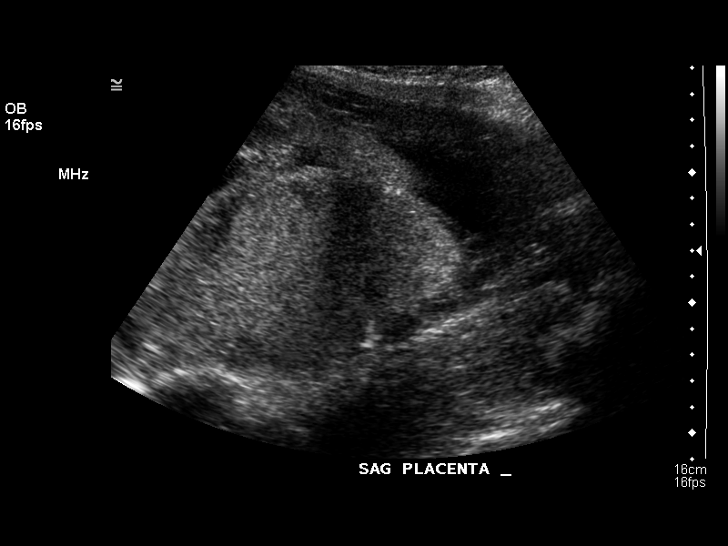
[im 19/25]
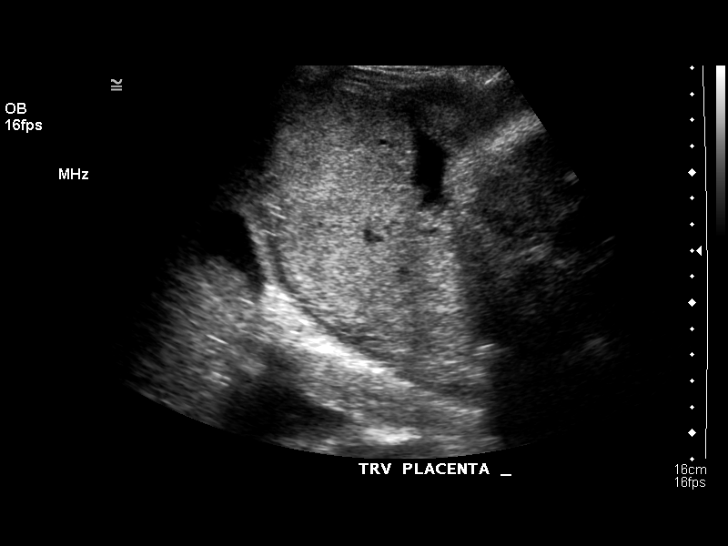
[im 21/25]
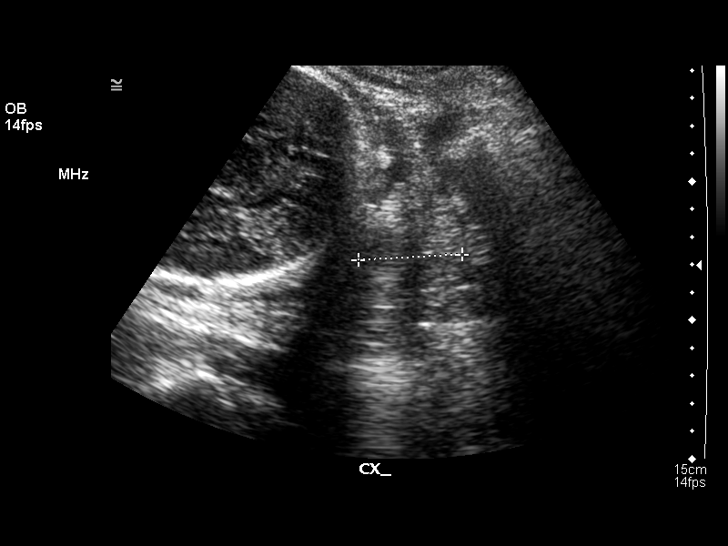
[im 23/25]
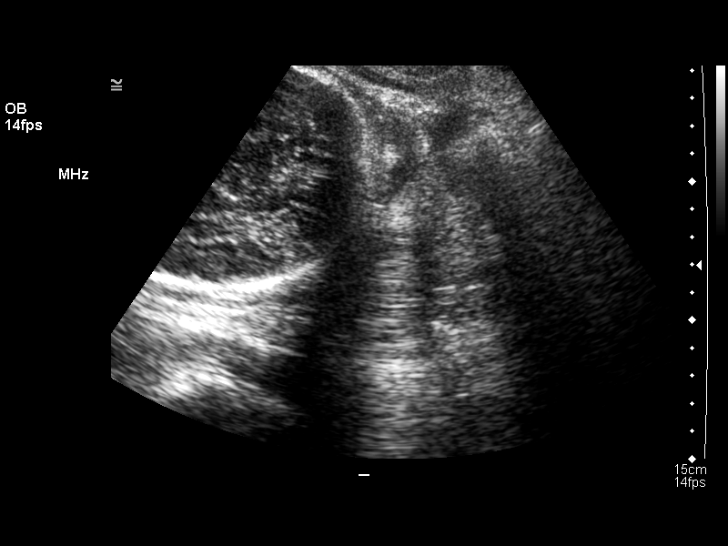
[im 25/25]
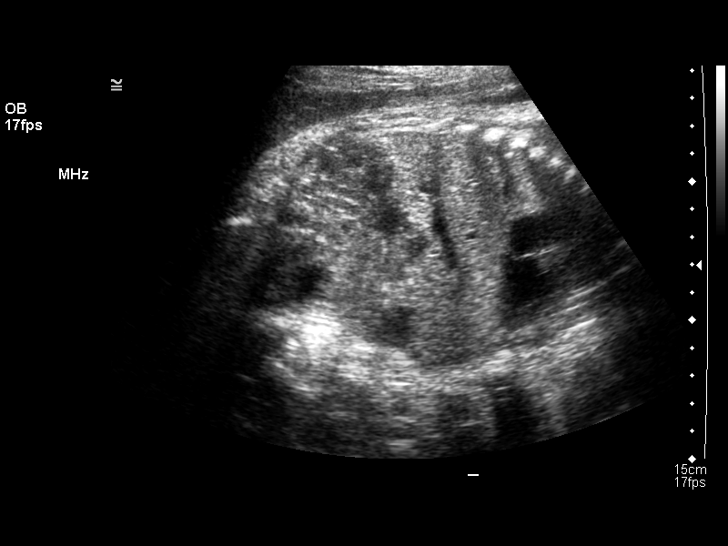

[14 of 25 positions shown; findings below may reference images not displayed]

BIOPHYSICAL PROFILE

Number of Fetuses:  1
Heart rate:  141
Movement:  Yes
Breathing:  Yes
Presentation:  Cephalic
Placental Location:  Posterior
Grade:  I
Previa:  No
Amniotic Fluid (Subjective):  Normal
Amniotic Fluid (Objective):  15.4 cm AFI (5th -95th%ile = 7.7 ? 24.9 cm for 36 wks)

Fetal measurements and complete anatomic evaluation were not requested.  The following fetal anatomy was visualized on this exam:  Stomach, kidneys and bladder.  

BPP SCORING
Movements:  2  Time:  15 minutes
Breathing:  2
Tone:  2
Amniotic Fluid:  2
Total Score:  8

MATERNAL UTERINE AND ADNEXAL FINDINGS
Cervix:  3.8 cm Transabdominally
IMPRESSION: 1.  Biophysical profile score [DATE].
2.  Subjectively and quantitatively normal amniotic fluid volume and normal cervical length.
3.  No late developing fetal anatomic abnormalities are identified associated with the stomach, kidneys or bladder.  The lateral ventricles and four chamber heart view could not be seen with clarity due to positioning on today?s exam.

## 2005-10-03 ENCOUNTER — Inpatient Hospital Stay (HOSPITAL_COMMUNITY): Admission: RE | Admit: 2005-10-03 | Discharge: 2005-10-06 | Payer: Self-pay | Admitting: Obstetrics and Gynecology

## 2005-10-03 ENCOUNTER — Encounter (INDEPENDENT_AMBULATORY_CARE_PROVIDER_SITE_OTHER): Payer: Self-pay | Admitting: *Deleted

## 2005-11-13 ENCOUNTER — Other Ambulatory Visit: Admission: RE | Admit: 2005-11-13 | Discharge: 2005-11-13 | Payer: Self-pay | Admitting: Obstetrics and Gynecology

## 2005-11-18 ENCOUNTER — Ambulatory Visit (HOSPITAL_COMMUNITY): Admission: RE | Admit: 2005-11-18 | Discharge: 2005-11-18 | Payer: Self-pay | Admitting: Family Medicine

## 2005-11-18 ENCOUNTER — Emergency Department (HOSPITAL_COMMUNITY): Admission: EM | Admit: 2005-11-18 | Discharge: 2005-11-18 | Payer: Self-pay | Admitting: Family Medicine

## 2006-06-04 ENCOUNTER — Emergency Department (HOSPITAL_COMMUNITY): Admission: EM | Admit: 2006-06-04 | Discharge: 2006-06-04 | Payer: Self-pay | Admitting: Emergency Medicine

## 2006-06-18 ENCOUNTER — Encounter: Admission: RE | Admit: 2006-06-18 | Discharge: 2006-06-18 | Payer: Self-pay | Admitting: General Practice
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
## 2006-09-24 ENCOUNTER — Emergency Department (HOSPITAL_COMMUNITY): Admission: EM | Admit: 2006-09-24 | Discharge: 2006-09-24 | Payer: Self-pay | Admitting: Family Medicine

## 2006-10-19 ENCOUNTER — Emergency Department (HOSPITAL_COMMUNITY): Admission: EM | Admit: 2006-10-19 | Discharge: 2006-10-19 | Payer: Self-pay | Admitting: Emergency Medicine

## 2006-11-14 ENCOUNTER — Other Ambulatory Visit: Admission: RE | Admit: 2006-11-14 | Discharge: 2006-11-14 | Payer: Self-pay | Admitting: Obstetrics and Gynecology

## 2007-01-22 ENCOUNTER — Ambulatory Visit (HOSPITAL_COMMUNITY): Admission: RE | Admit: 2007-01-22 | Discharge: 2007-01-22 | Payer: Self-pay | Admitting: Obstetrics and Gynecology

## 2007-01-22 IMAGING — US US OB COMP +14 WK
1 series · 8 of 8 positions shown · non-contrast
Comparison: none

OBSTETRICAL ULTRASOUND:

 This ultrasound exam was performed in the [HOSPITAL] Ultrasound Department.  The OB US report was generated in the AS system, and faxed to the ordering physician.  This report is also available in [REDACTED] PACS.

[Series 1: us ob comp +14 wk · 0.18mm/px · 8 of 8 slices shown]
[im 1/8]
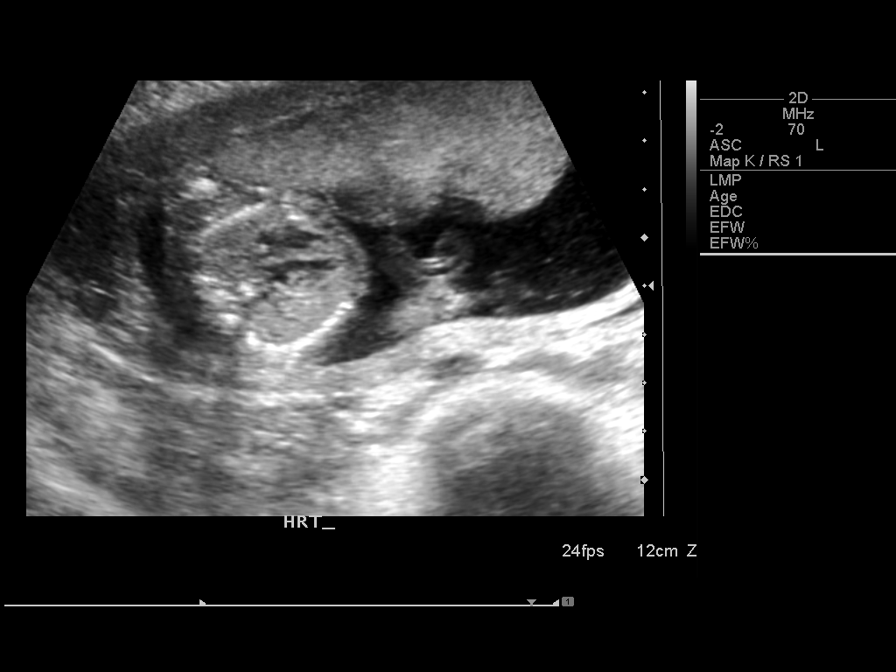
[im 2/8]
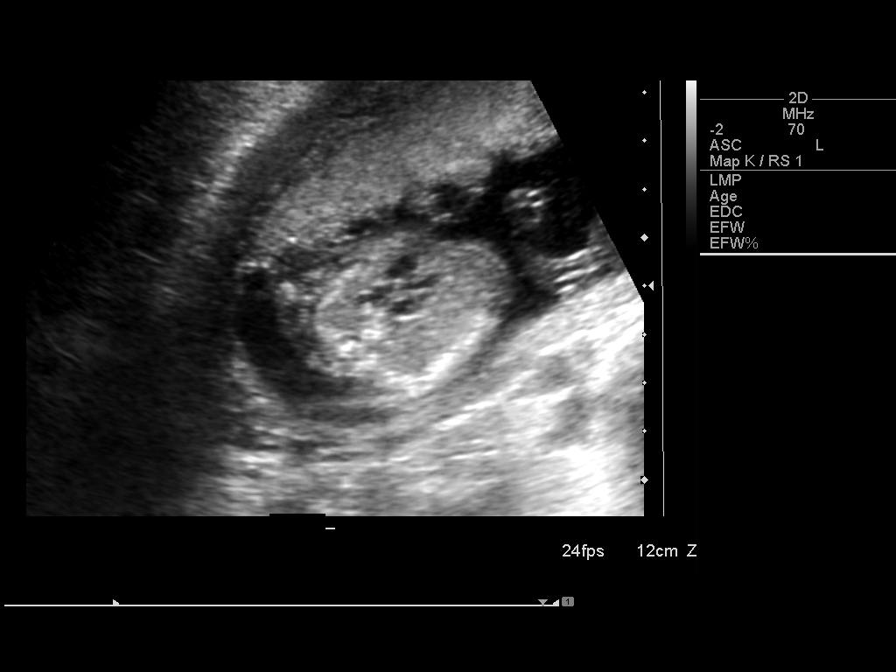
[im 3/8]
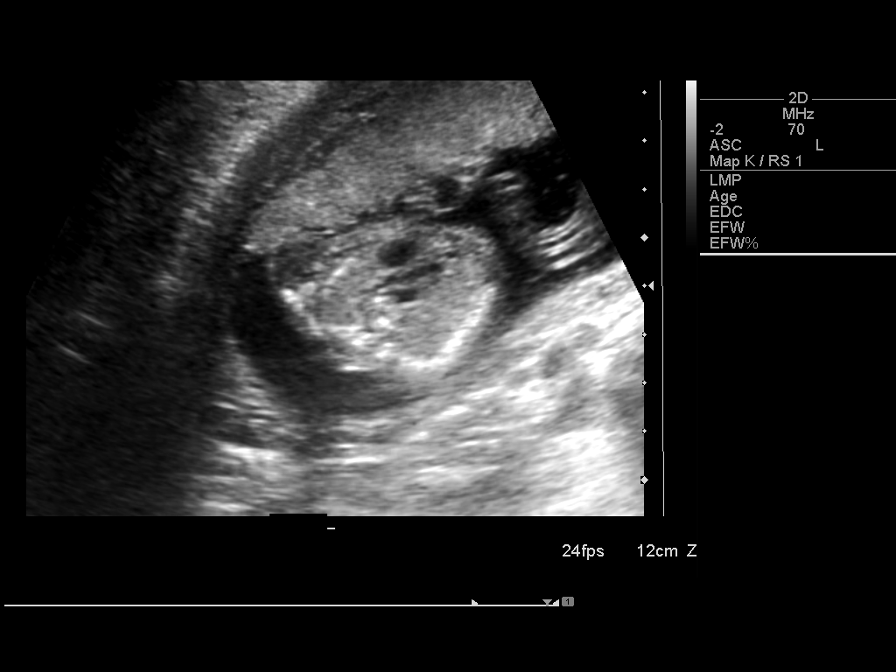
[im 4/8]
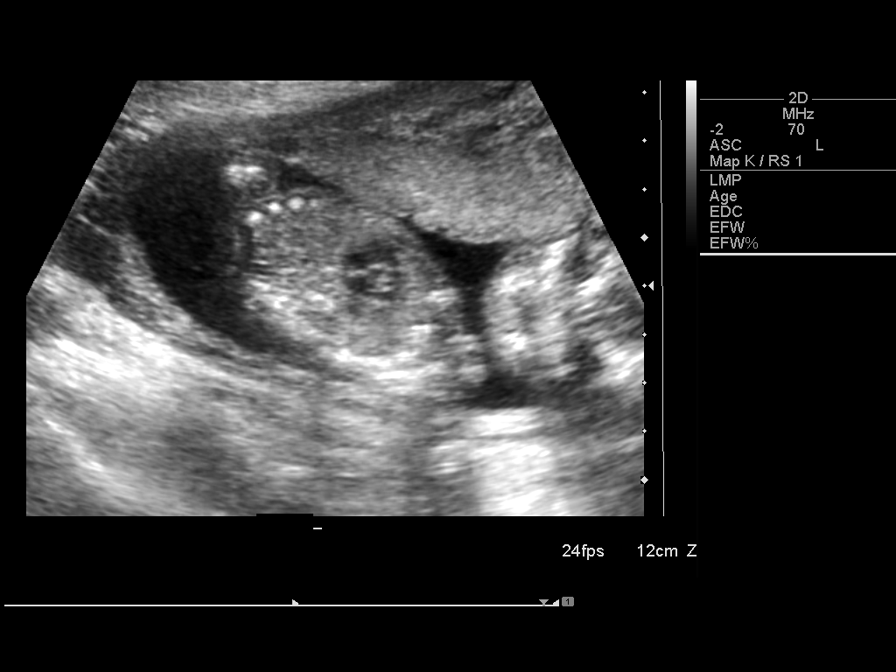
[im 5/8]
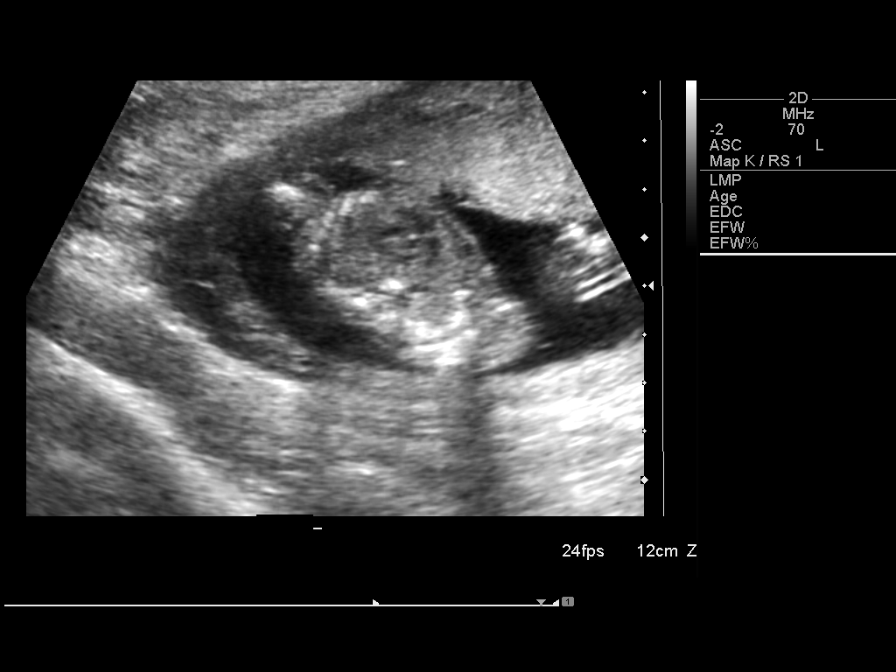
[im 6/8]
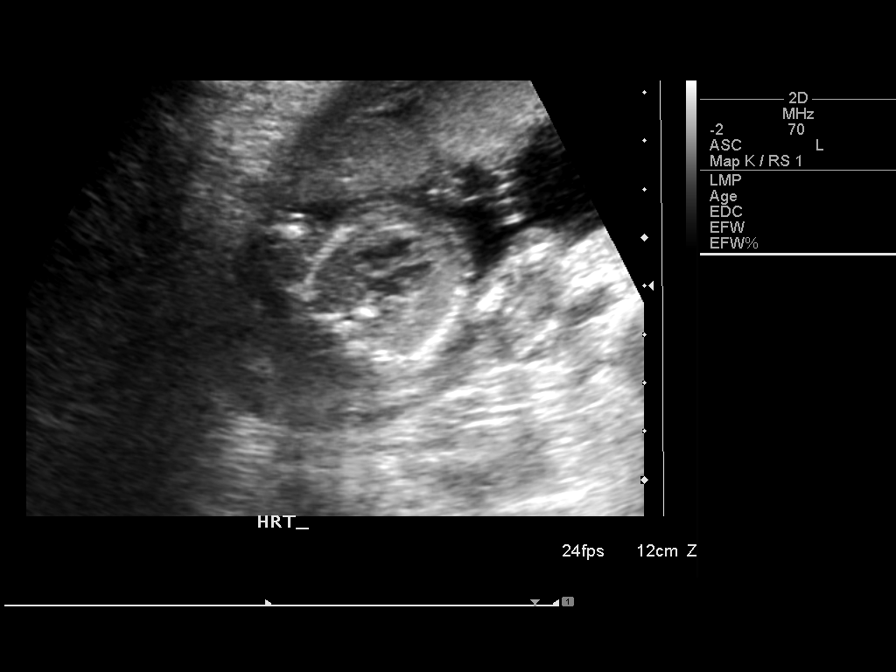
[im 7/8]
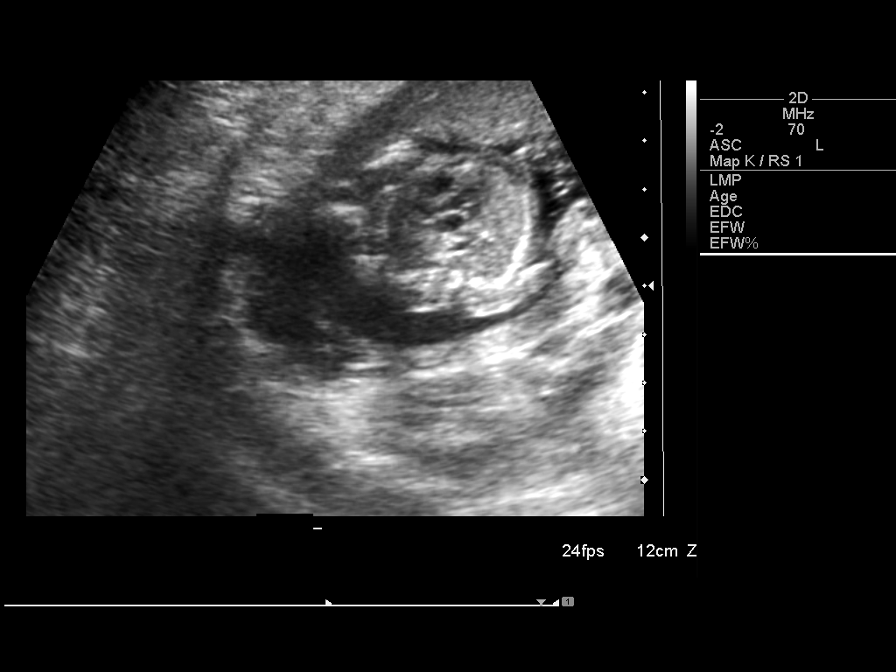
[im 8/8]
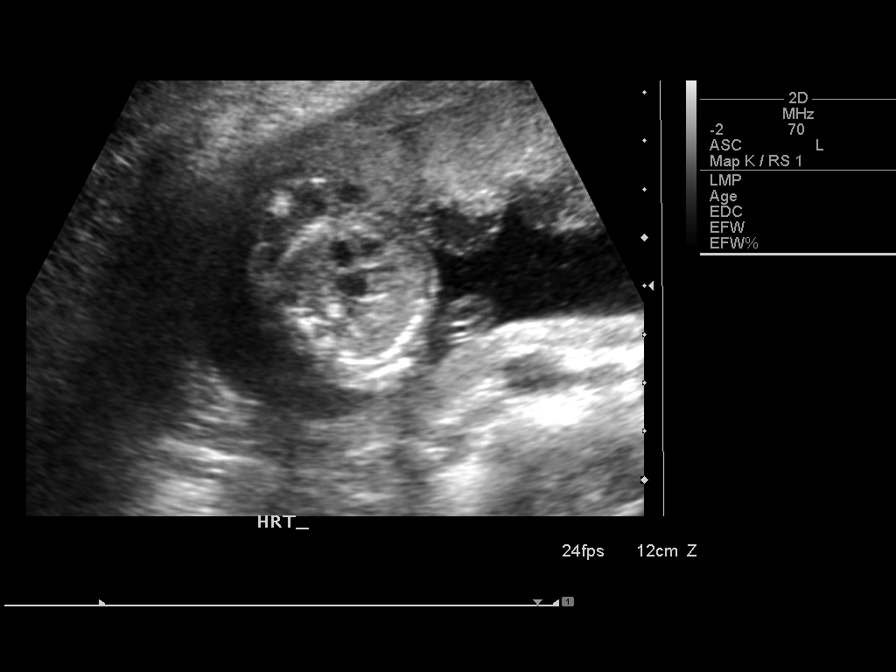

[8 of 8 positions shown; findings below may reference images not displayed]

IMPRESSION: See AS Obstetric US report.

## 2007-01-22 IMAGING — US US OB COMP +14 WK
1 series · 14 of 28 positions shown · non-contrast
Comparison: none

OBSTETRICAL ULTRASOUND:

 This ultrasound exam was performed in the [HOSPITAL] Ultrasound Department.  The OB US report was generated in the AS system, and faxed to the ordering physician.  This report is also available in [REDACTED] PACS.

[Series 1: us ob comp +14 wk · 0.30mm/px · 14 of 66 slices shown]
[im 3/66]
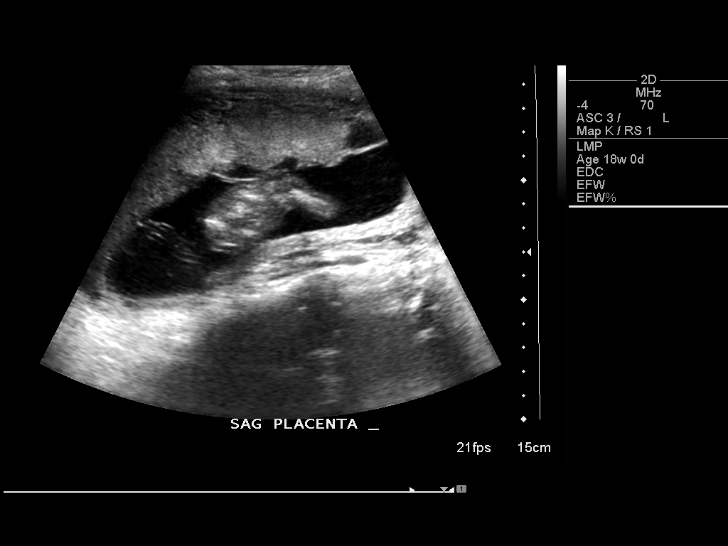
[im 8/66]
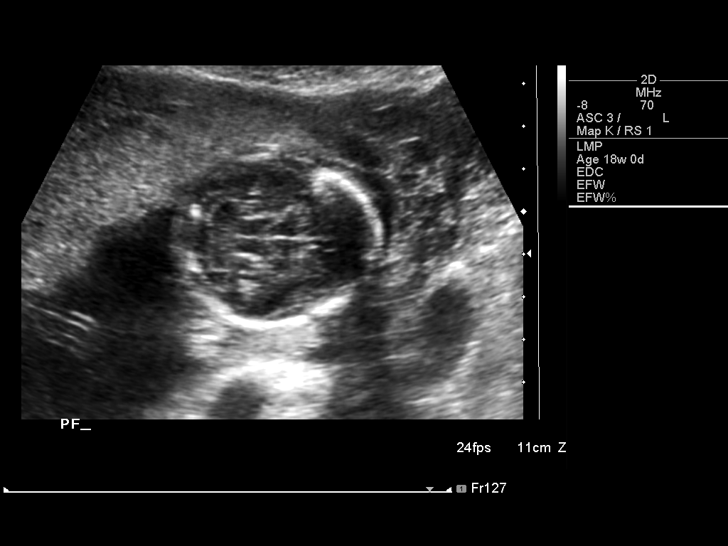
[im 13/66]
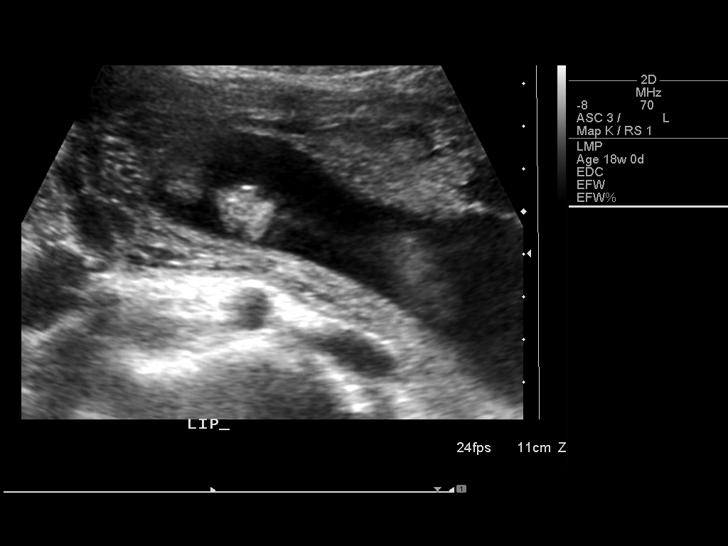
[im 17/66]
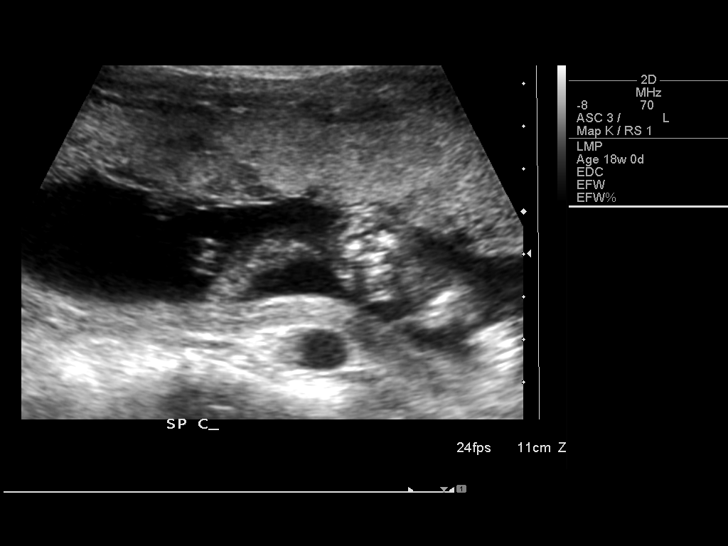
[im 22/66]
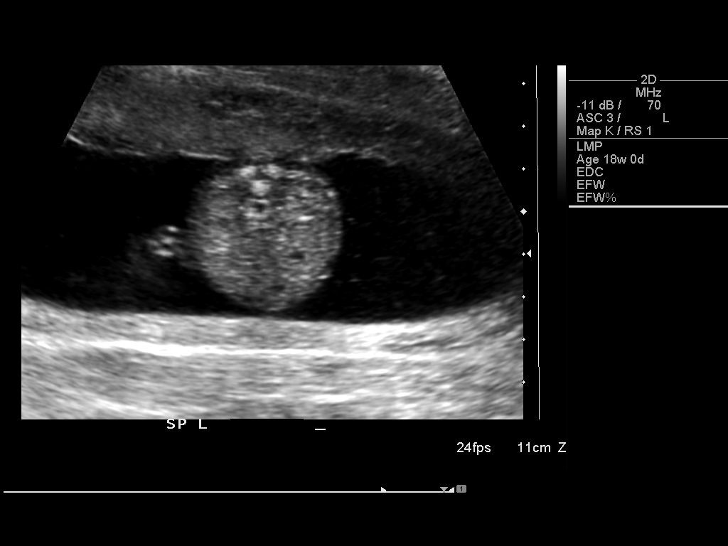
[im 27/66]
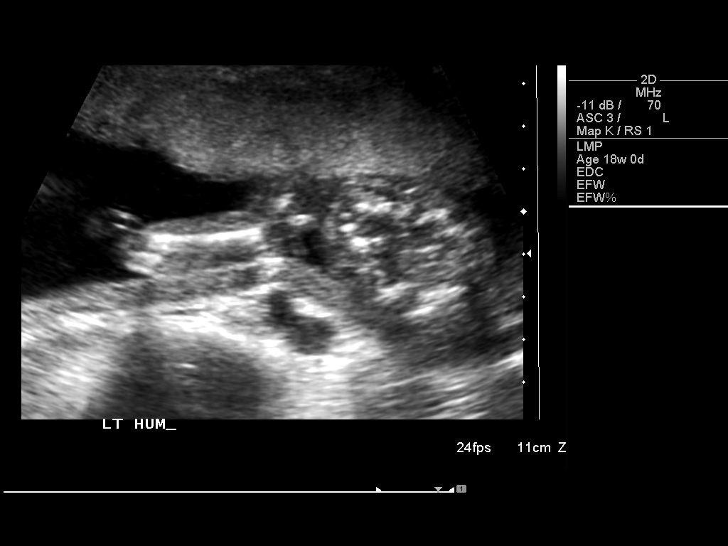
[im 32/66]
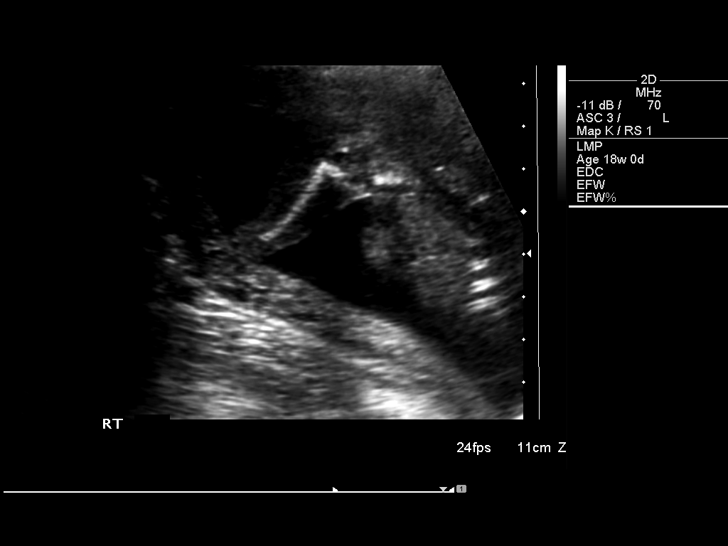
[im 37/66]
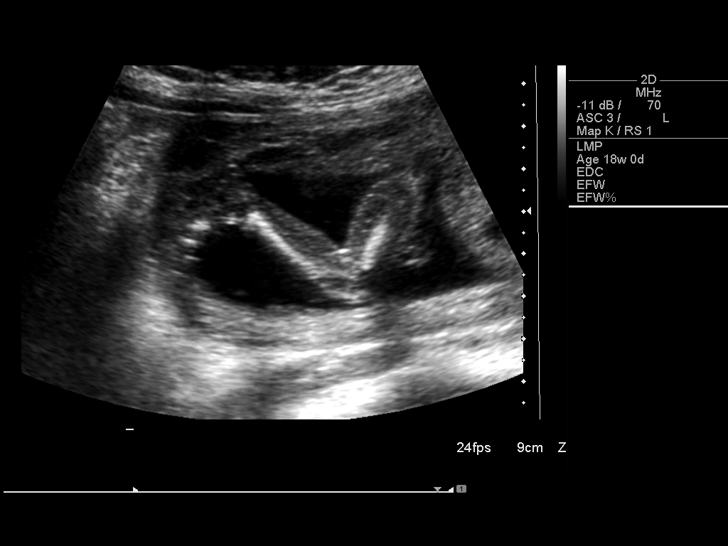
[im 41/66]
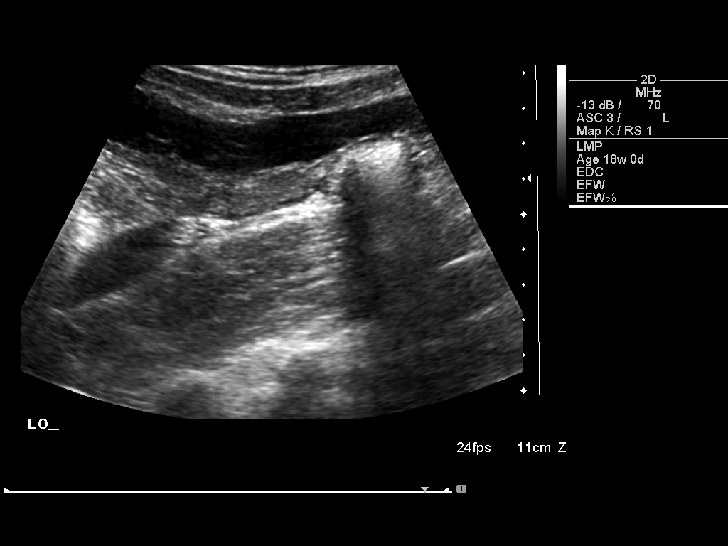
[im 46/66]
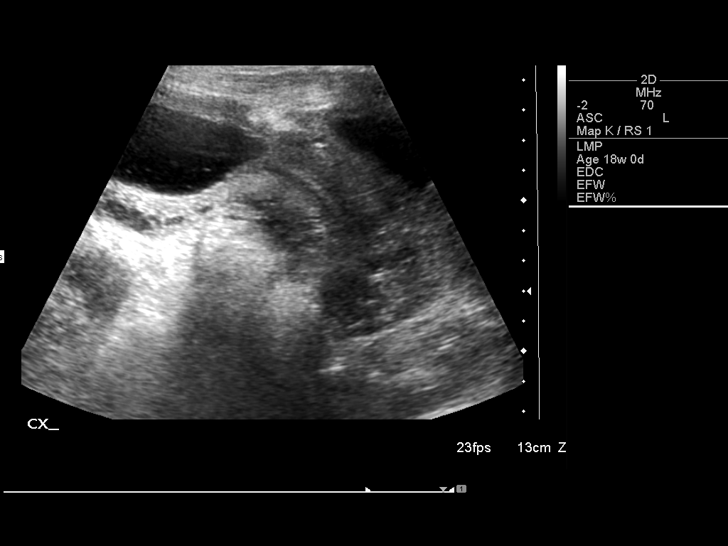
[im 51/66]
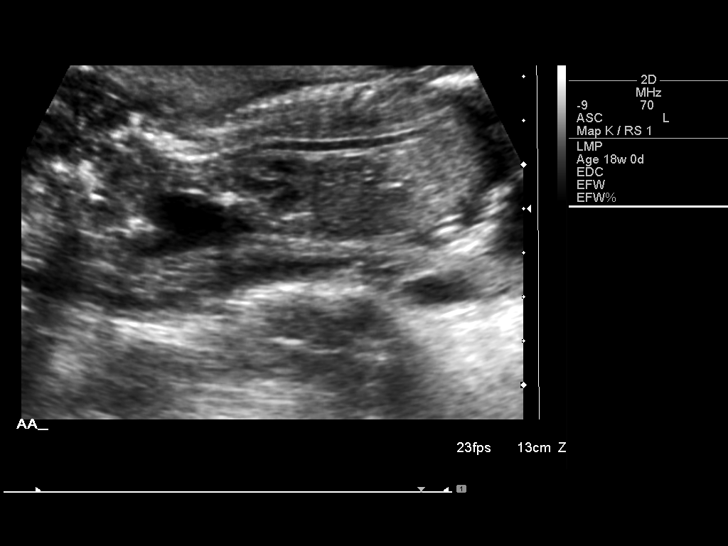
[im 56/66]
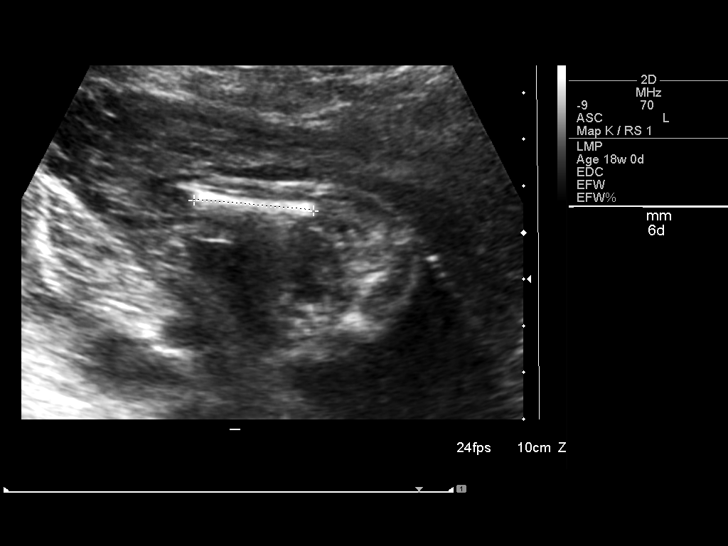
[im 61/66]
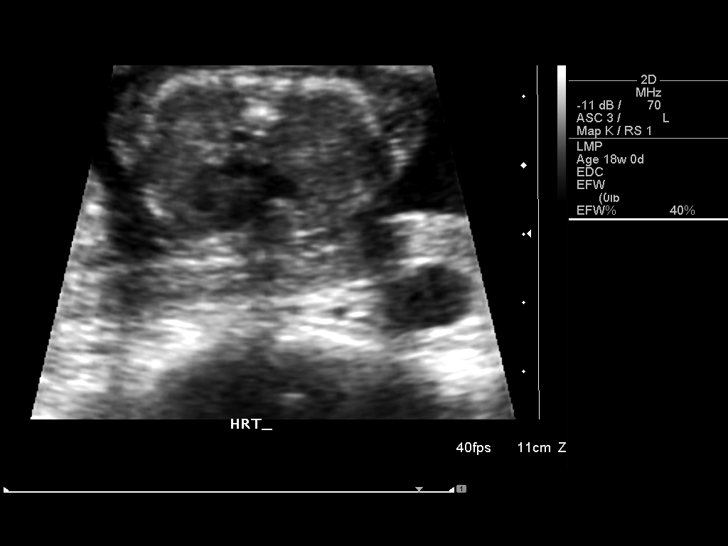
[im 66/66]
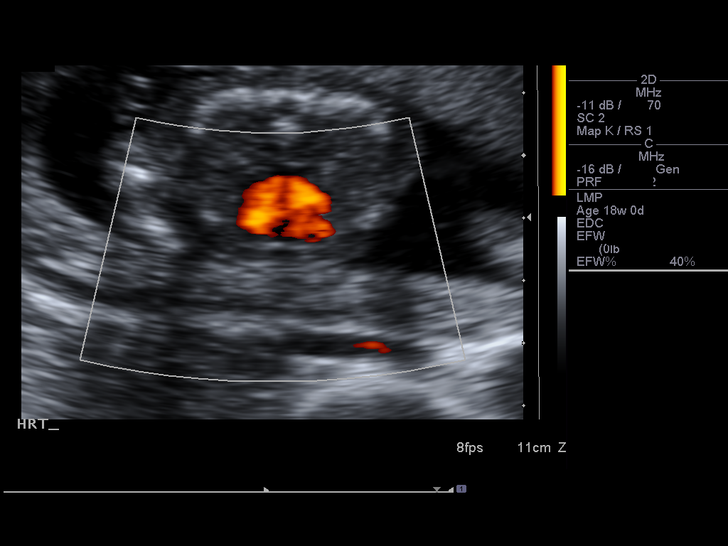

[14 of 28 positions shown; findings below may reference images not displayed]

IMPRESSION: See AS Obstetric US report.

## 2007-02-12 ENCOUNTER — Emergency Department (HOSPITAL_COMMUNITY): Admission: EM | Admit: 2007-02-12 | Discharge: 2007-02-13 | Payer: Self-pay | Admitting: Emergency Medicine

## 2007-02-12 IMAGING — CT CT HEAD W/O CM
1 series · 16 of 30 positions shown, 20 images · IV contrast (agent unspecified)
Comparison: none

CLINICAL DATA: Headache.  Dizziness.  Blurred vision.  Pregnant.  
HEAD CT WITHOUT CONTRAST:
TECHNIQUE: Contiguous axial images were obtained from the base of the skull through the vertex according to standard protocol without contrast.

[Series 2: headseq 4.8 h45s · axial · 0.43mm/px · z∈[-664,-536]mm · 16 of 30 slices shown, 20 images]
[im 2/30  brain]
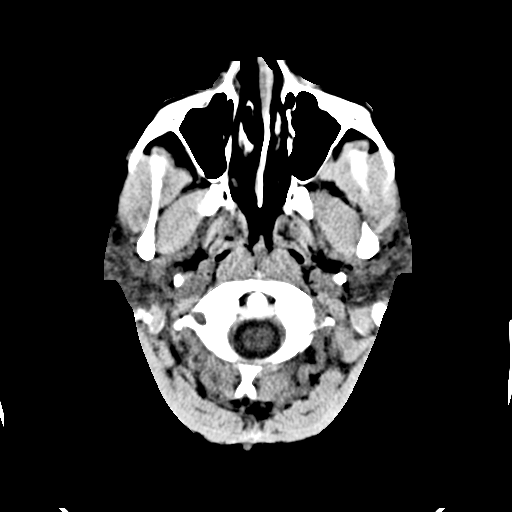
[im 2/30  bone]
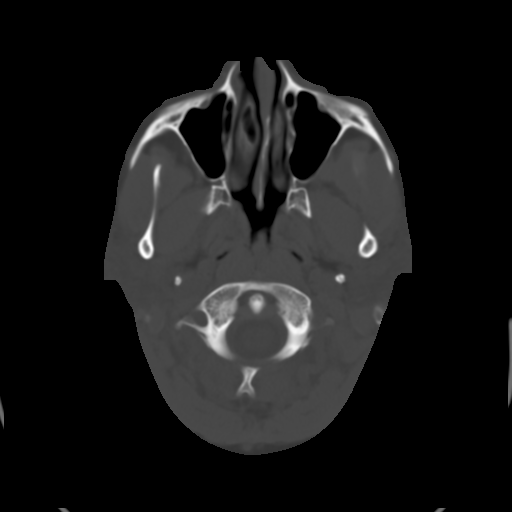
[im 4/30  brain]
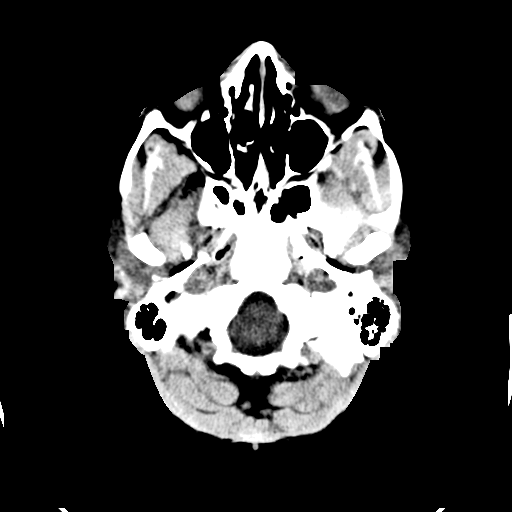
[im 6/30  brain]
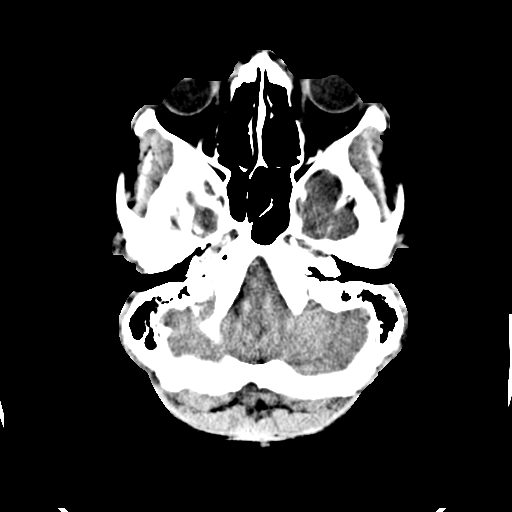
[im 8/30  brain]
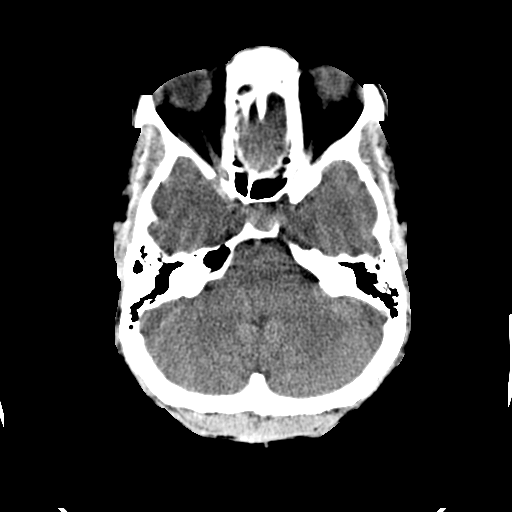
[im 9/30  brain]
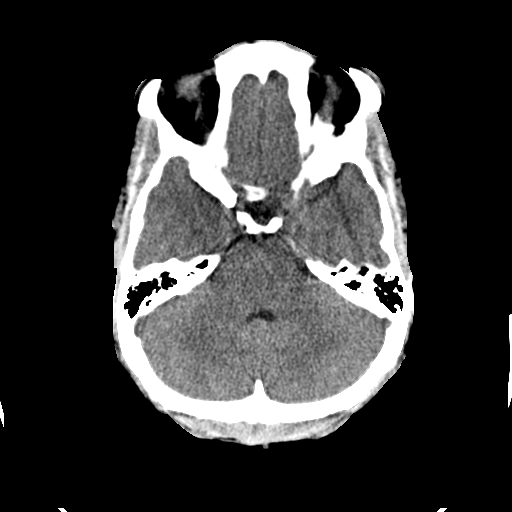
[im 9/30  bone]
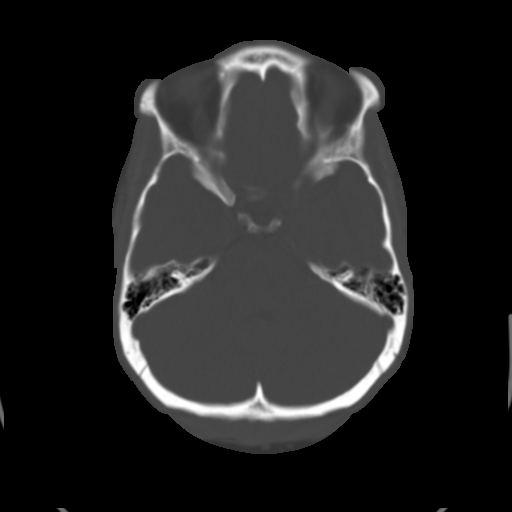
[im 11/30  brain]
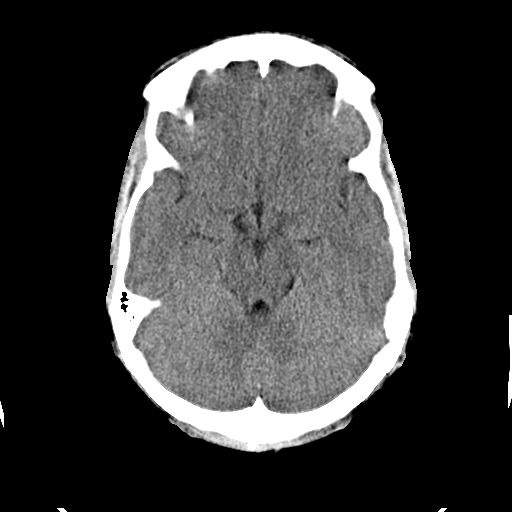
[im 13/30  brain]
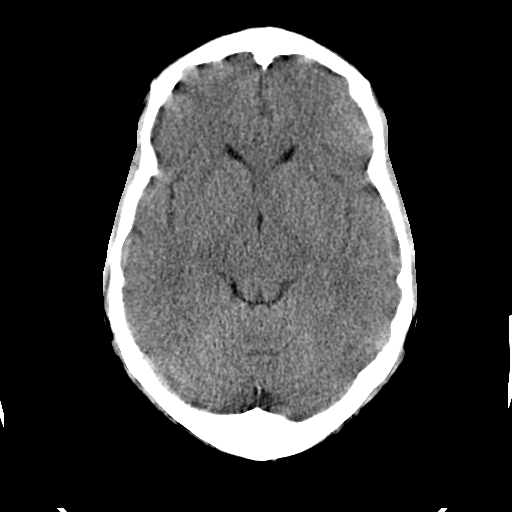
[im 15/30  brain]
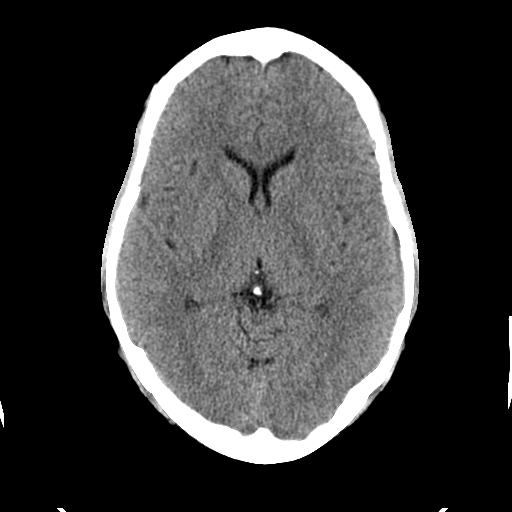
[im 16/30  brain]
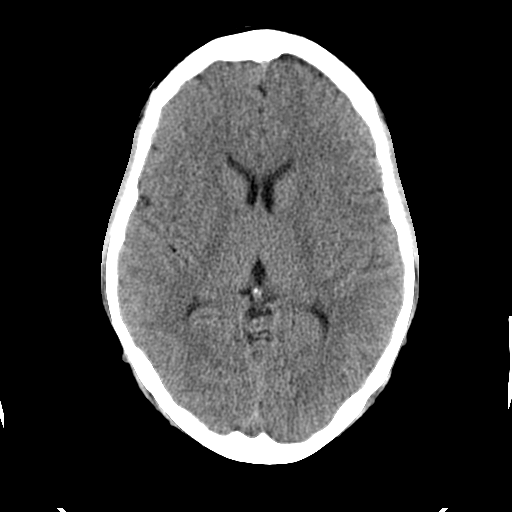
[im 16/30  bone]
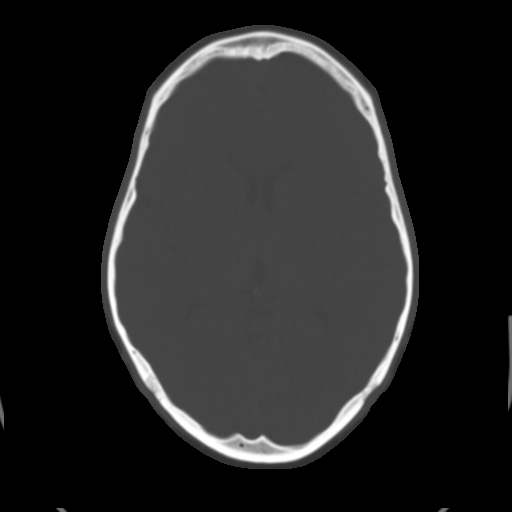
[im 18/30  brain]
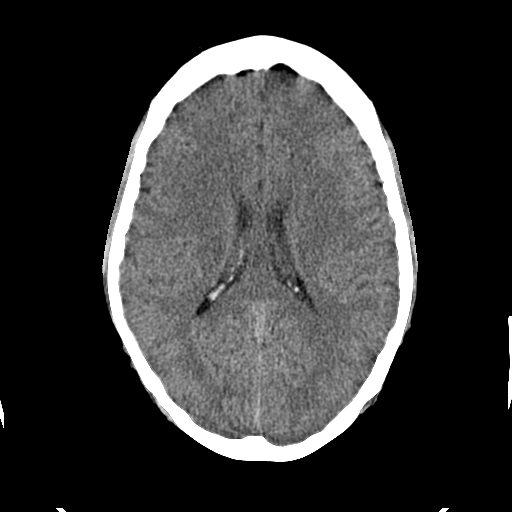
[im 20/30  brain]
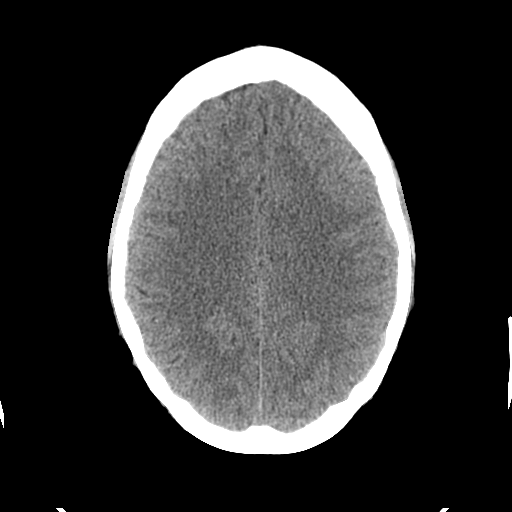
[im 22/30  brain]
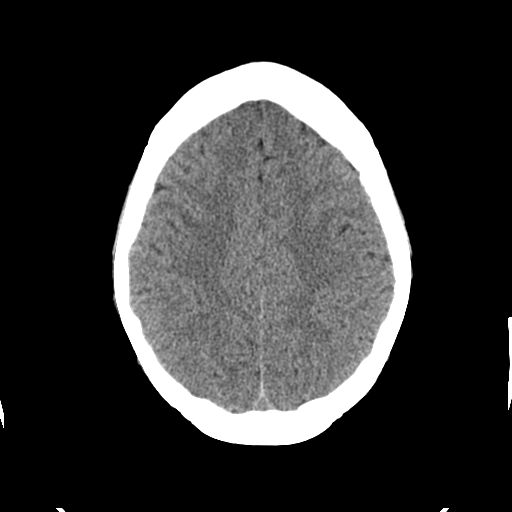
[im 23/30  brain]
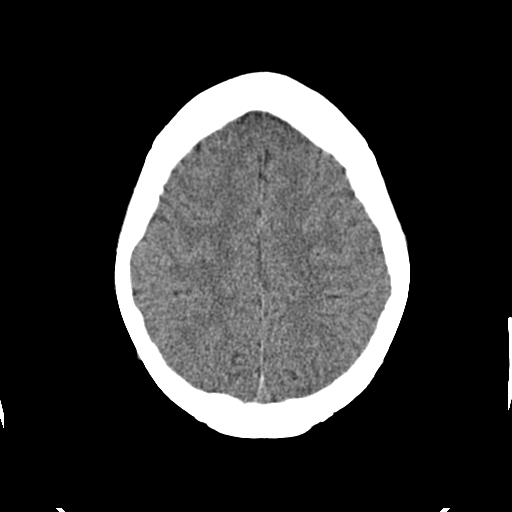
[im 23/30  bone]
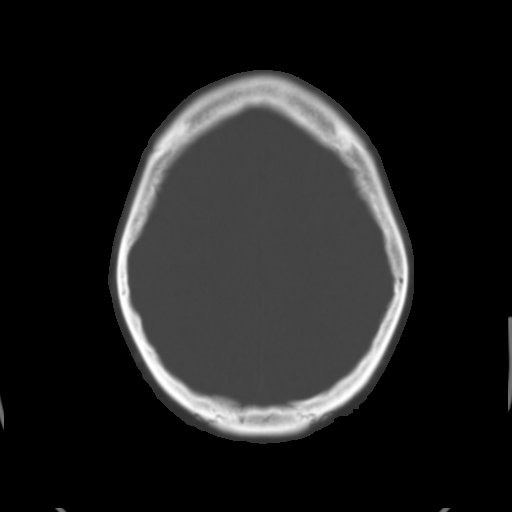
[im 25/30  brain]
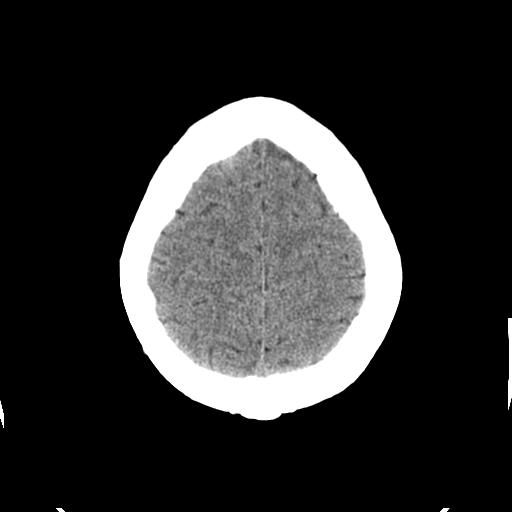
[im 27/30  brain]
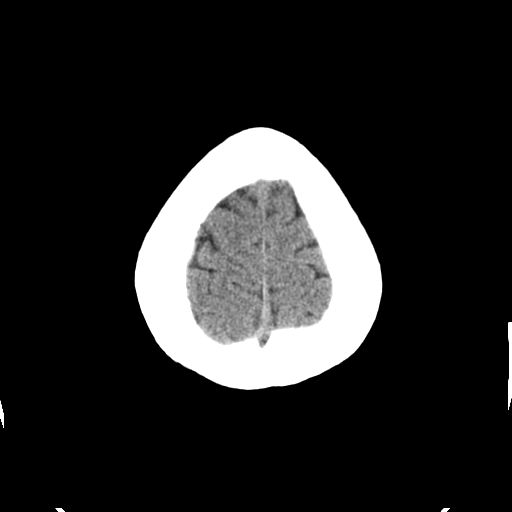
[im 29/30  brain]
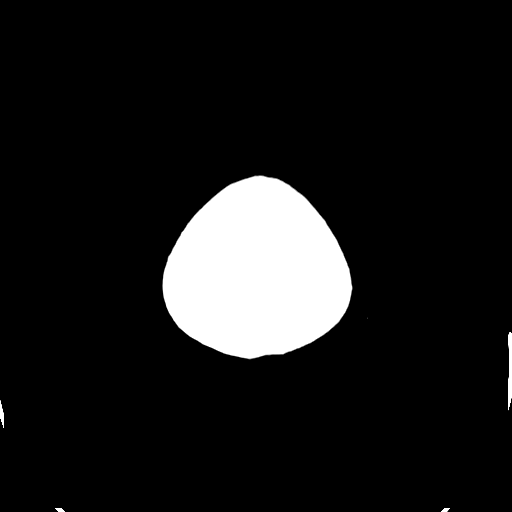

[16 of 30 positions shown; findings below may reference images not displayed]

FINDINGS: The ventricles have somewhat of an attenuated appearance including the 4th ventricle.  The lower posterior fossa/foramen magnum region has somewhat of a crowded appearance potentially representing some degree of tonsillar ectopia or Chiari I malformation.  Sella appears slightly prominent caliber on the lateral scout view and I get the impression there may be CSF attenuation low in the sella, i.e., query empty sella.  No intracranial hemorrhage, mass, or evidence of acute infarction.
IMPRESSION: Findings described above are somewhat nonspecific; however, the combination of the findings raises at least moderate suspicion for pseudotumor cerebri (benign intracranial hypertension) in the proper clinical setting.

## 2007-05-30 ENCOUNTER — Inpatient Hospital Stay (HOSPITAL_COMMUNITY): Admission: AD | Admit: 2007-05-30 | Discharge: 2007-05-30 | Payer: Self-pay | Admitting: Obstetrics and Gynecology

## 2007-05-31 ENCOUNTER — Inpatient Hospital Stay (HOSPITAL_COMMUNITY): Admission: AD | Admit: 2007-05-31 | Discharge: 2007-06-04 | Payer: Self-pay | Admitting: Obstetrics and Gynecology

## 2007-05-31 IMAGING — US US FETAL BPP W/O NONSTRESS
1 series · 14 of 18 positions shown · non-contrast
Comparison: none

OBSTETRICAL ULTRASOUND:

 This ultrasound exam was performed in the [HOSPITAL] Ultrasound Department.  The OB US report was generated in the AS system, and faxed to the ordering physician.  This report is also available in [REDACTED] PACS.

[Series 1: us fetal bpp w/o nonstress · non-contrast · 14 of 18 slices shown]
[im 1/18]
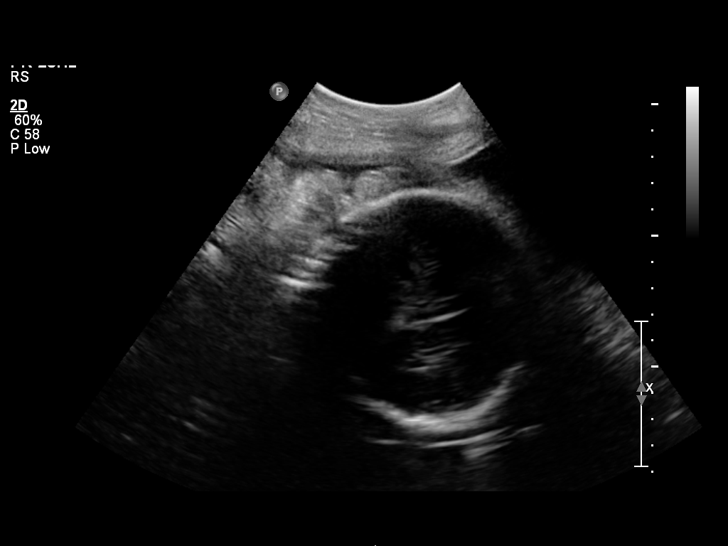
[im 2/18]
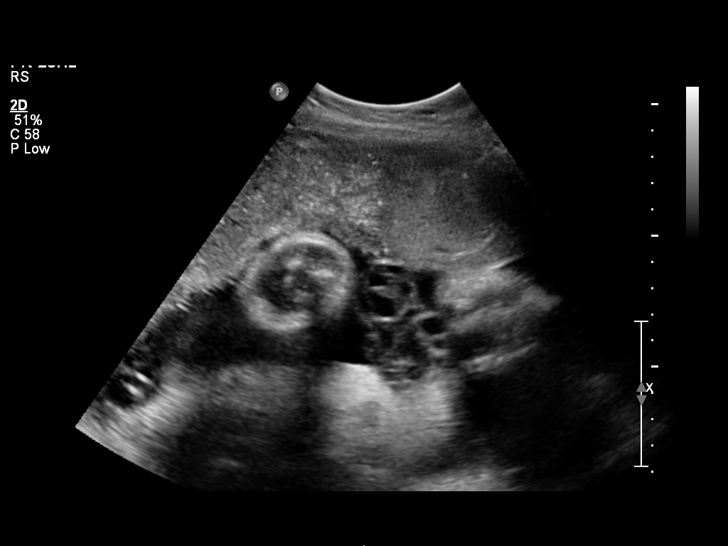
[im 4/18]
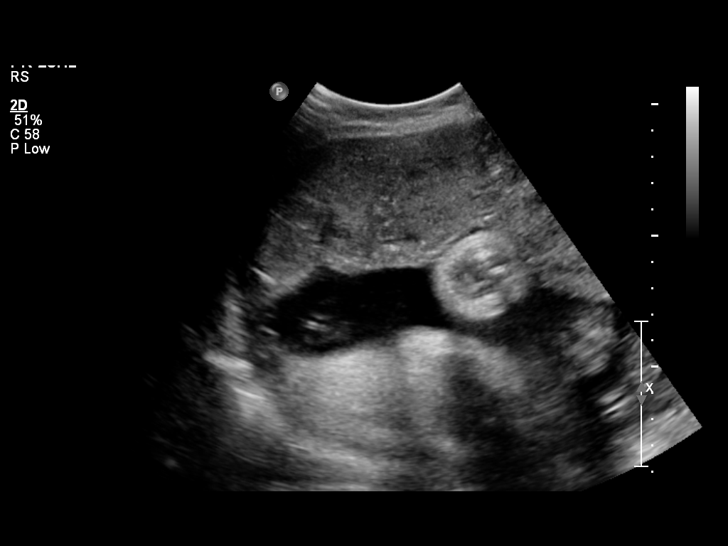
[im 5/18]
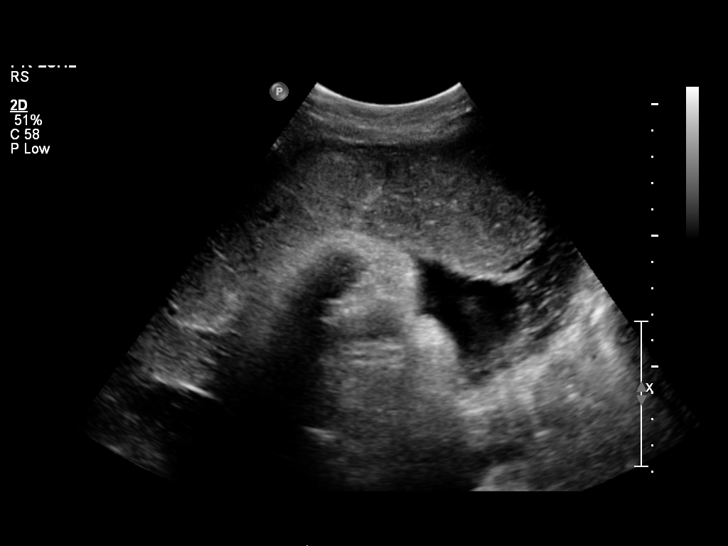
[im 6/18]
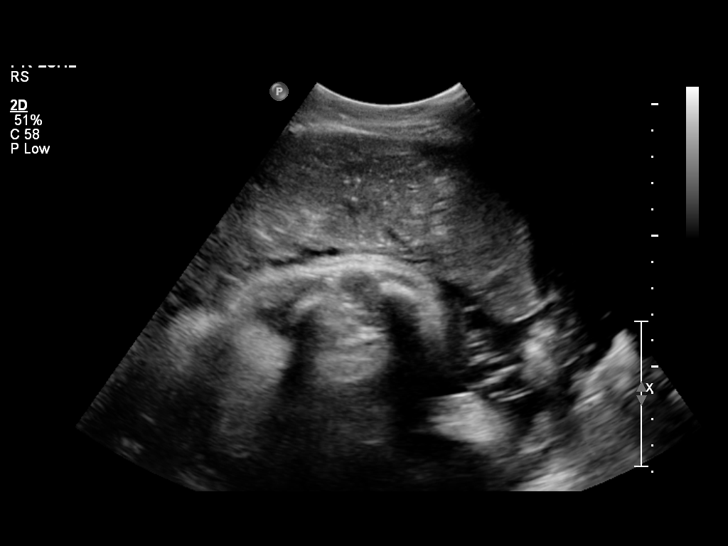
[im 8/18]
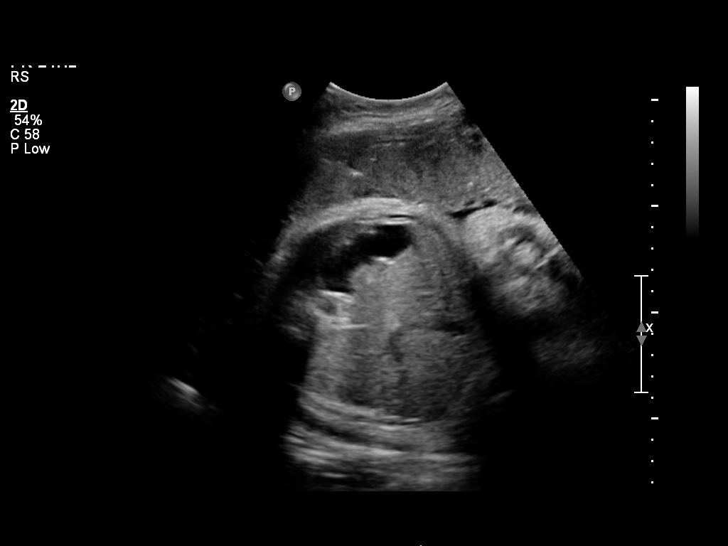
[im 9/18]
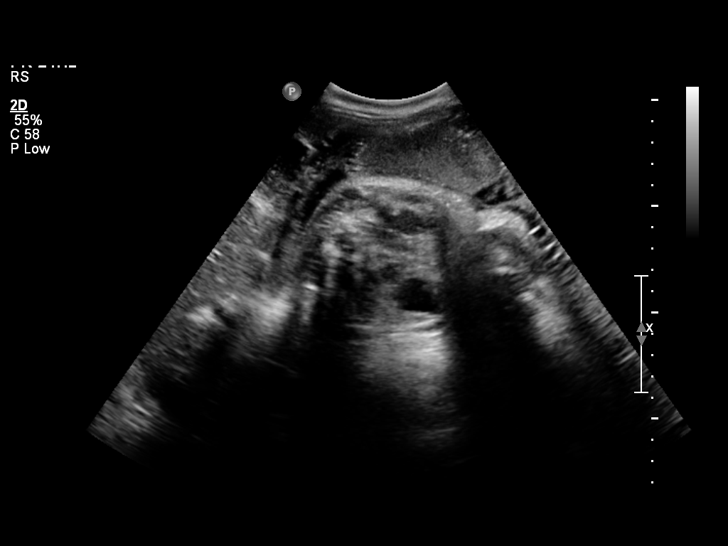
[im 10/18]
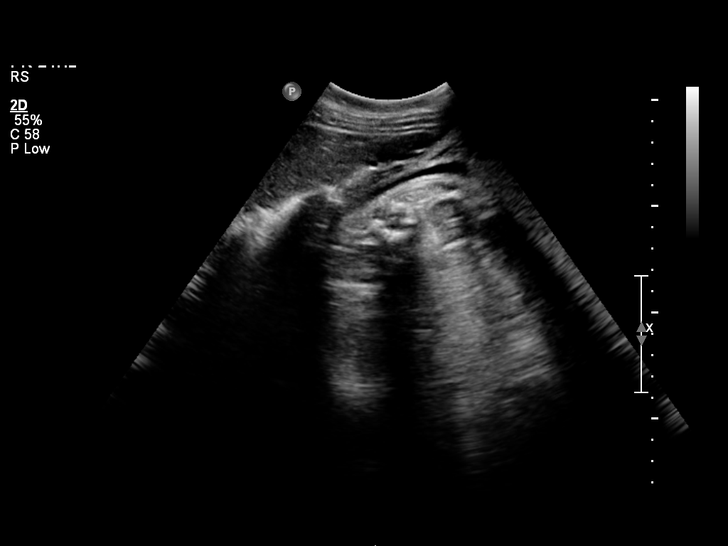
[im 11/18]
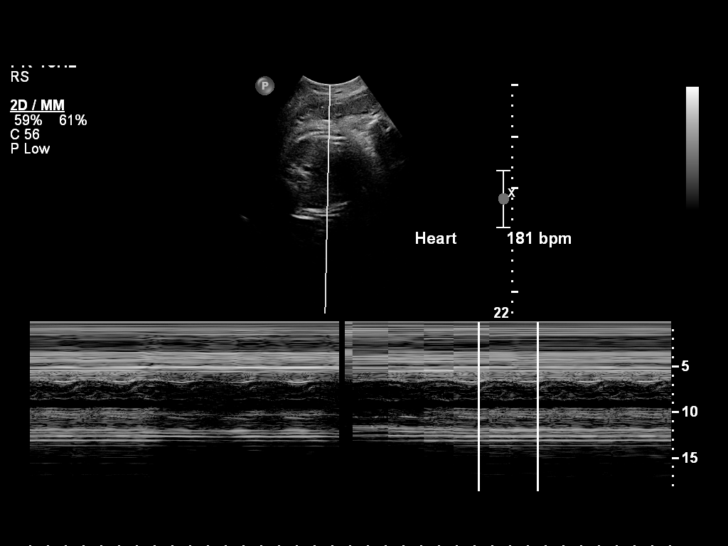
[im 13/18]
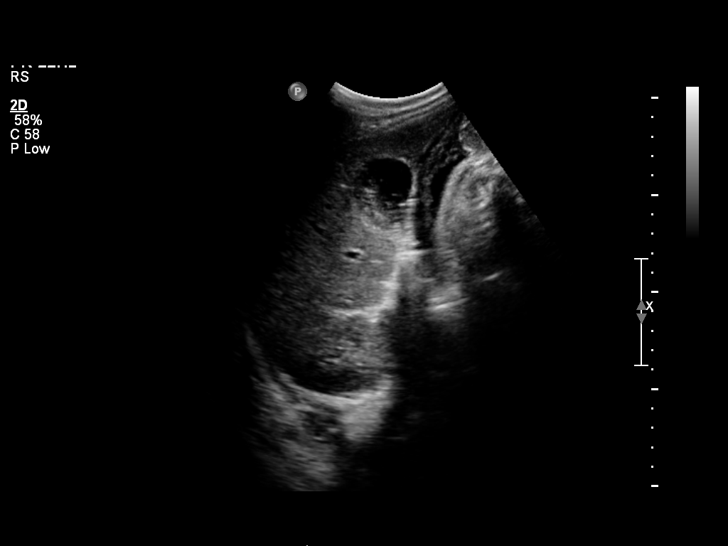
[im 14/18]
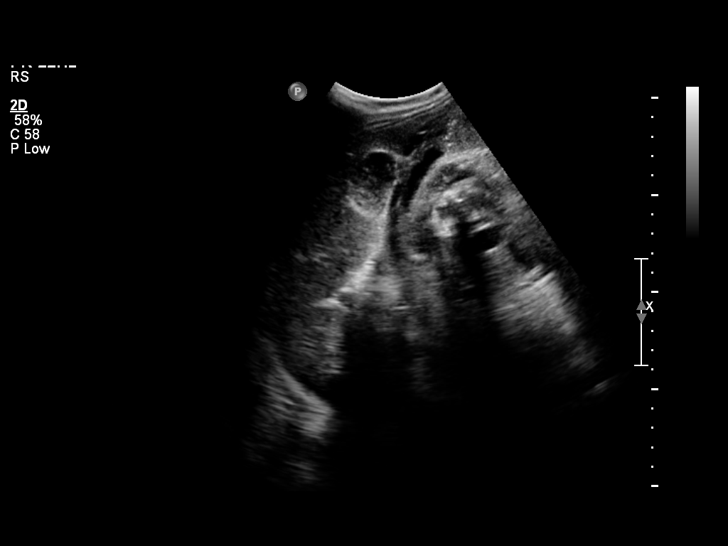
[im 15/18]
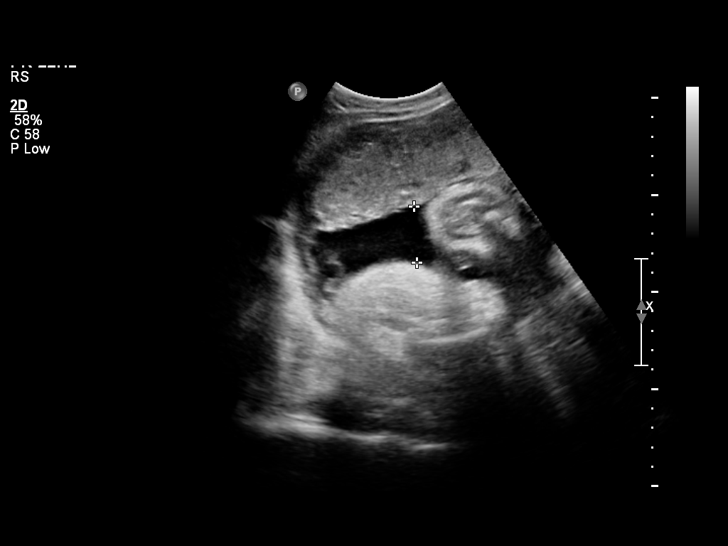
[im 17/18]
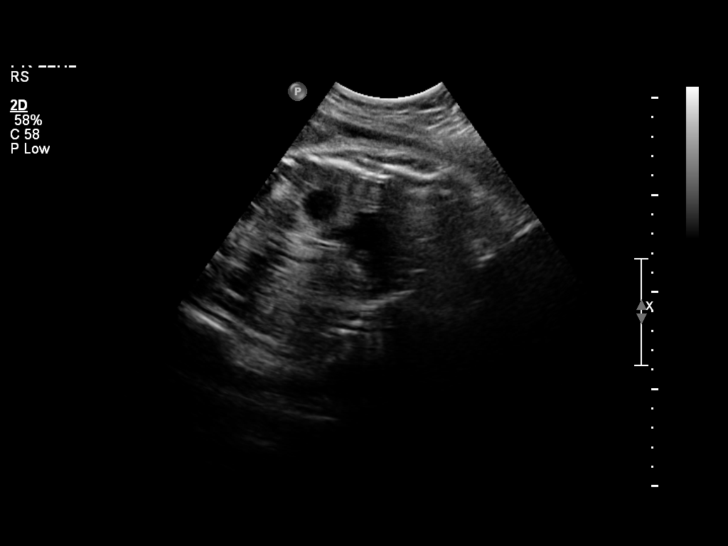
[im 18/18]
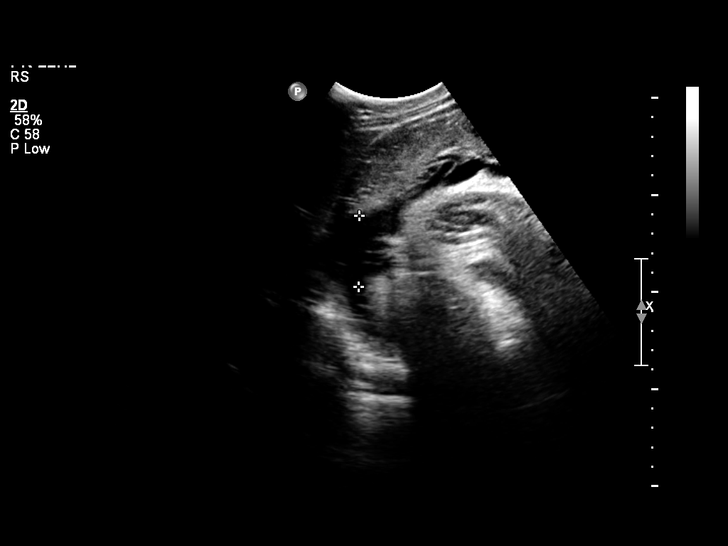

[14 of 18 positions shown; findings below may reference images not displayed]

IMPRESSION: See AS Obstetric US report.

## 2007-06-01 ENCOUNTER — Encounter (INDEPENDENT_AMBULATORY_CARE_PROVIDER_SITE_OTHER): Payer: Self-pay | Admitting: Obstetrics and Gynecology

## 2007-07-05 ENCOUNTER — Emergency Department (HOSPITAL_COMMUNITY): Admission: EM | Admit: 2007-07-05 | Discharge: 2007-07-05 | Payer: Self-pay | Admitting: Family Medicine

## 2007-07-14 ENCOUNTER — Other Ambulatory Visit: Admission: RE | Admit: 2007-07-14 | Discharge: 2007-07-14 | Payer: Self-pay | Admitting: Obstetrics and Gynecology

## 2007-07-15 ENCOUNTER — Emergency Department (HOSPITAL_COMMUNITY): Admission: EM | Admit: 2007-07-15 | Discharge: 2007-07-15 | Payer: Self-pay | Admitting: Emergency Medicine

## 2007-08-06 ENCOUNTER — Emergency Department (HOSPITAL_COMMUNITY): Admission: EM | Admit: 2007-08-06 | Discharge: 2007-08-06 | Payer: Self-pay | Admitting: Emergency Medicine

## 2007-08-29 ENCOUNTER — Emergency Department (HOSPITAL_COMMUNITY): Admission: EM | Admit: 2007-08-29 | Discharge: 2007-08-29 | Payer: Self-pay | Admitting: Family Medicine

## 2007-11-04 ENCOUNTER — Emergency Department (HOSPITAL_COMMUNITY): Admission: EM | Admit: 2007-11-04 | Discharge: 2007-11-04 | Payer: Self-pay | Admitting: Family Medicine

## 2007-11-15 ENCOUNTER — Emergency Department (HOSPITAL_COMMUNITY): Admission: EM | Admit: 2007-11-15 | Discharge: 2007-11-15 | Payer: Self-pay | Admitting: Emergency Medicine
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
## 2007-12-18 ENCOUNTER — Emergency Department (HOSPITAL_COMMUNITY): Admission: EM | Admit: 2007-12-18 | Discharge: 2007-12-18 | Payer: Self-pay | Admitting: Emergency Medicine

## 2007-12-18 IMAGING — CR DG CHEST 2V
2 series · 2 of 2 positions shown · non-contrast
Comparison: None

CLINICAL DATA: Cough and chest pain

CHEST - 2 VIEW

[w chest pa]
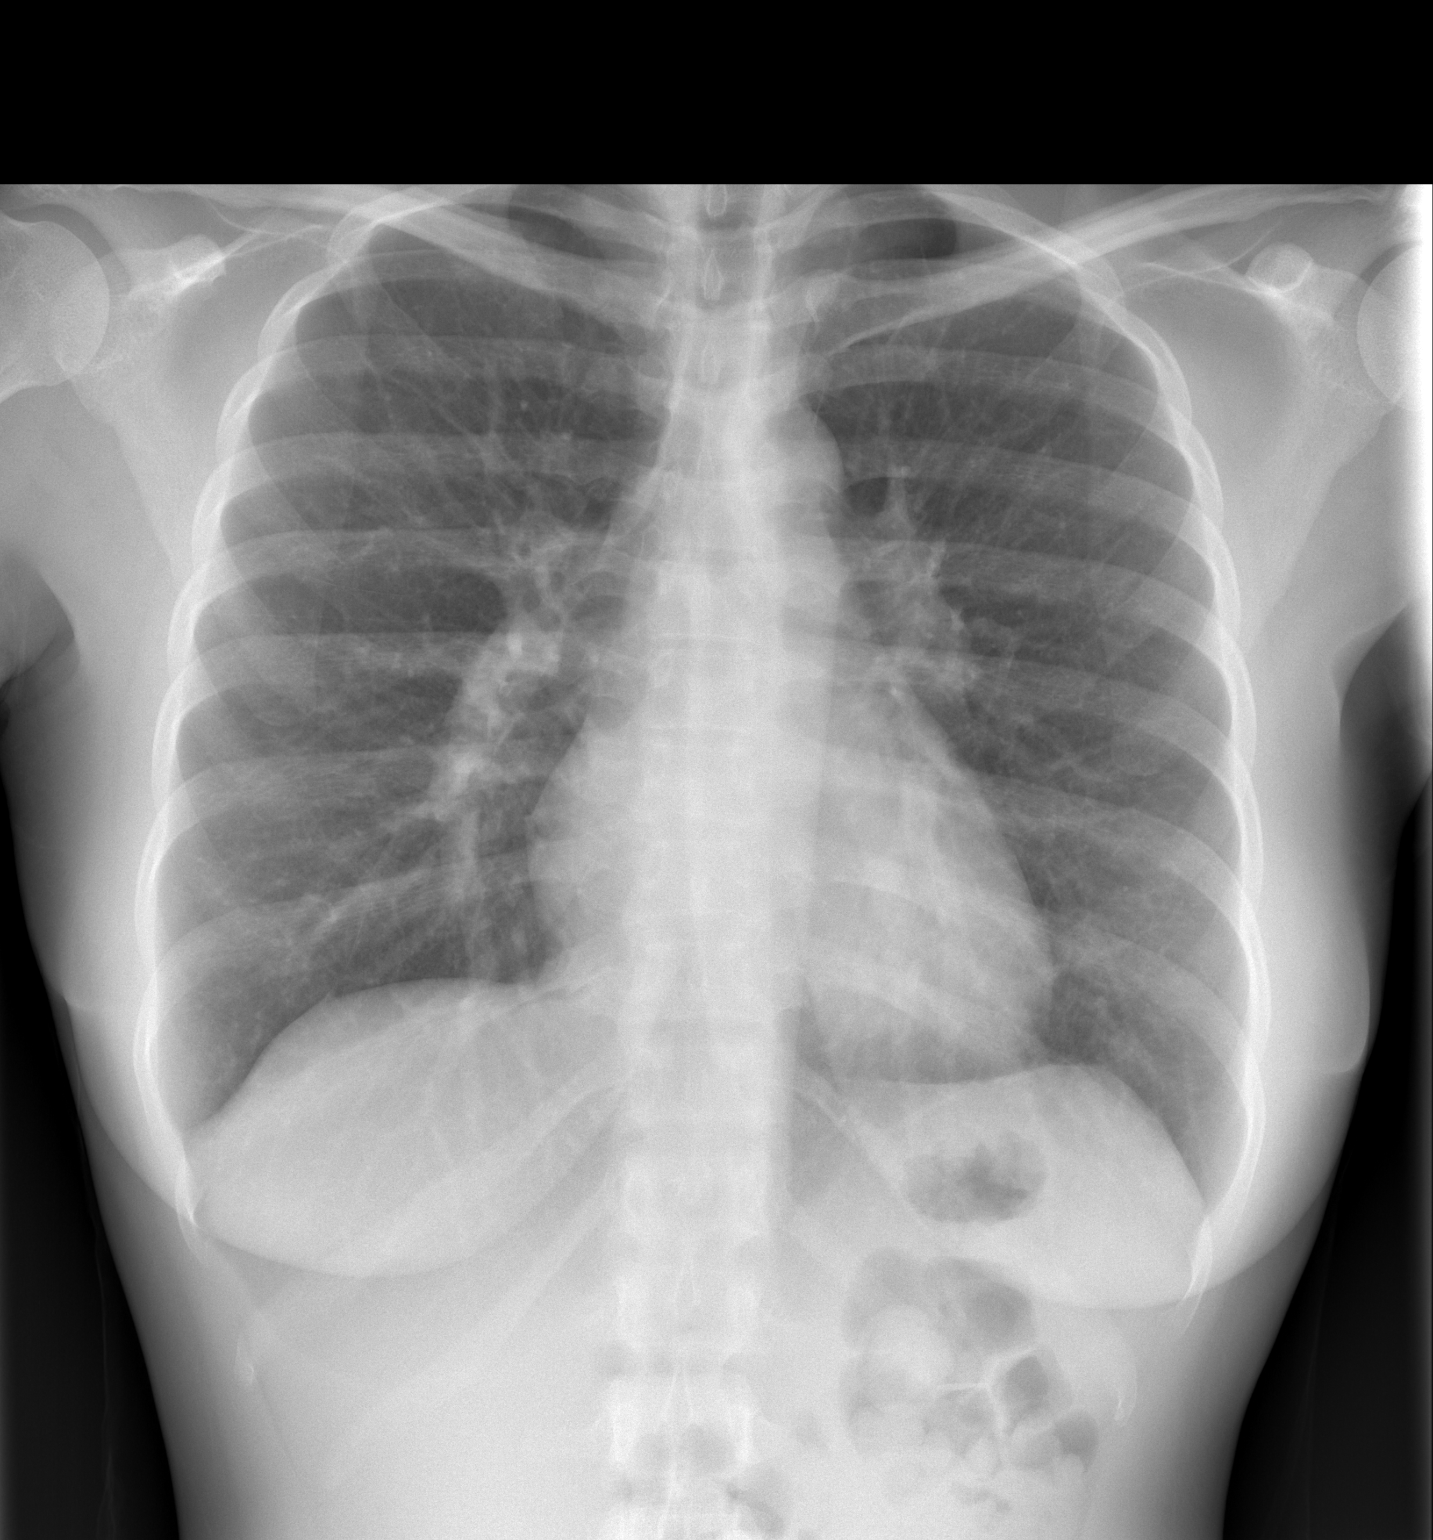

[w chest lat]
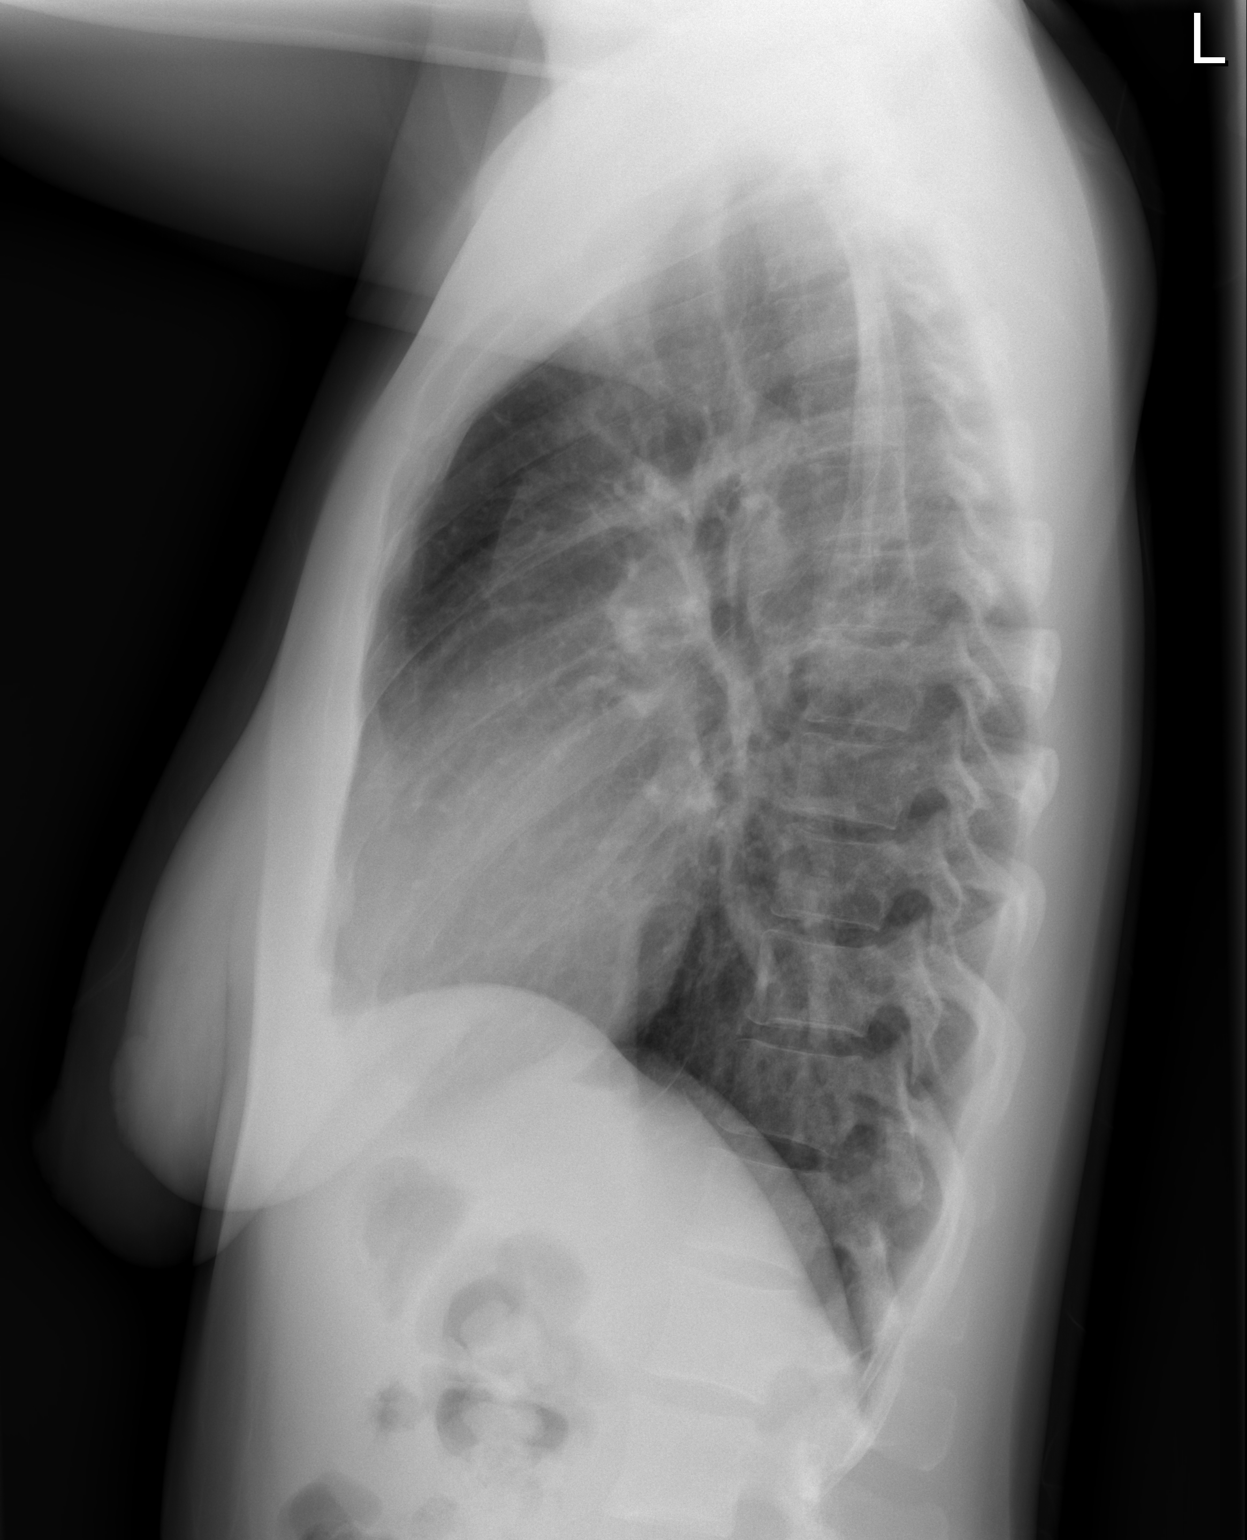

[2 of 2 positions shown; findings below may reference images not displayed]

FINDINGS: Lungs are clear.  The heart is normal.  Mediastinum and
hila are negative adenopathy.  Osseous structures normal
IMPRESSION: Negative for acute cardiopulmonary process.

## 2007-12-19 ENCOUNTER — Emergency Department (HOSPITAL_COMMUNITY): Admission: EM | Admit: 2007-12-19 | Discharge: 2007-12-19 | Payer: Self-pay | Admitting: Family Medicine

## 2007-12-23 ENCOUNTER — Emergency Department (HOSPITAL_COMMUNITY): Admission: EM | Admit: 2007-12-23 | Discharge: 2007-12-23 | Payer: Self-pay | Admitting: Emergency Medicine

## 2007-12-23 IMAGING — CR DG CHEST 2V
2 series · 2 of 2 positions shown · non-contrast
Comparison: Chest two-view [DATE]

CLINICAL DATA: Shortness of breath

CHEST - 2 VIEW

[w chest pa]
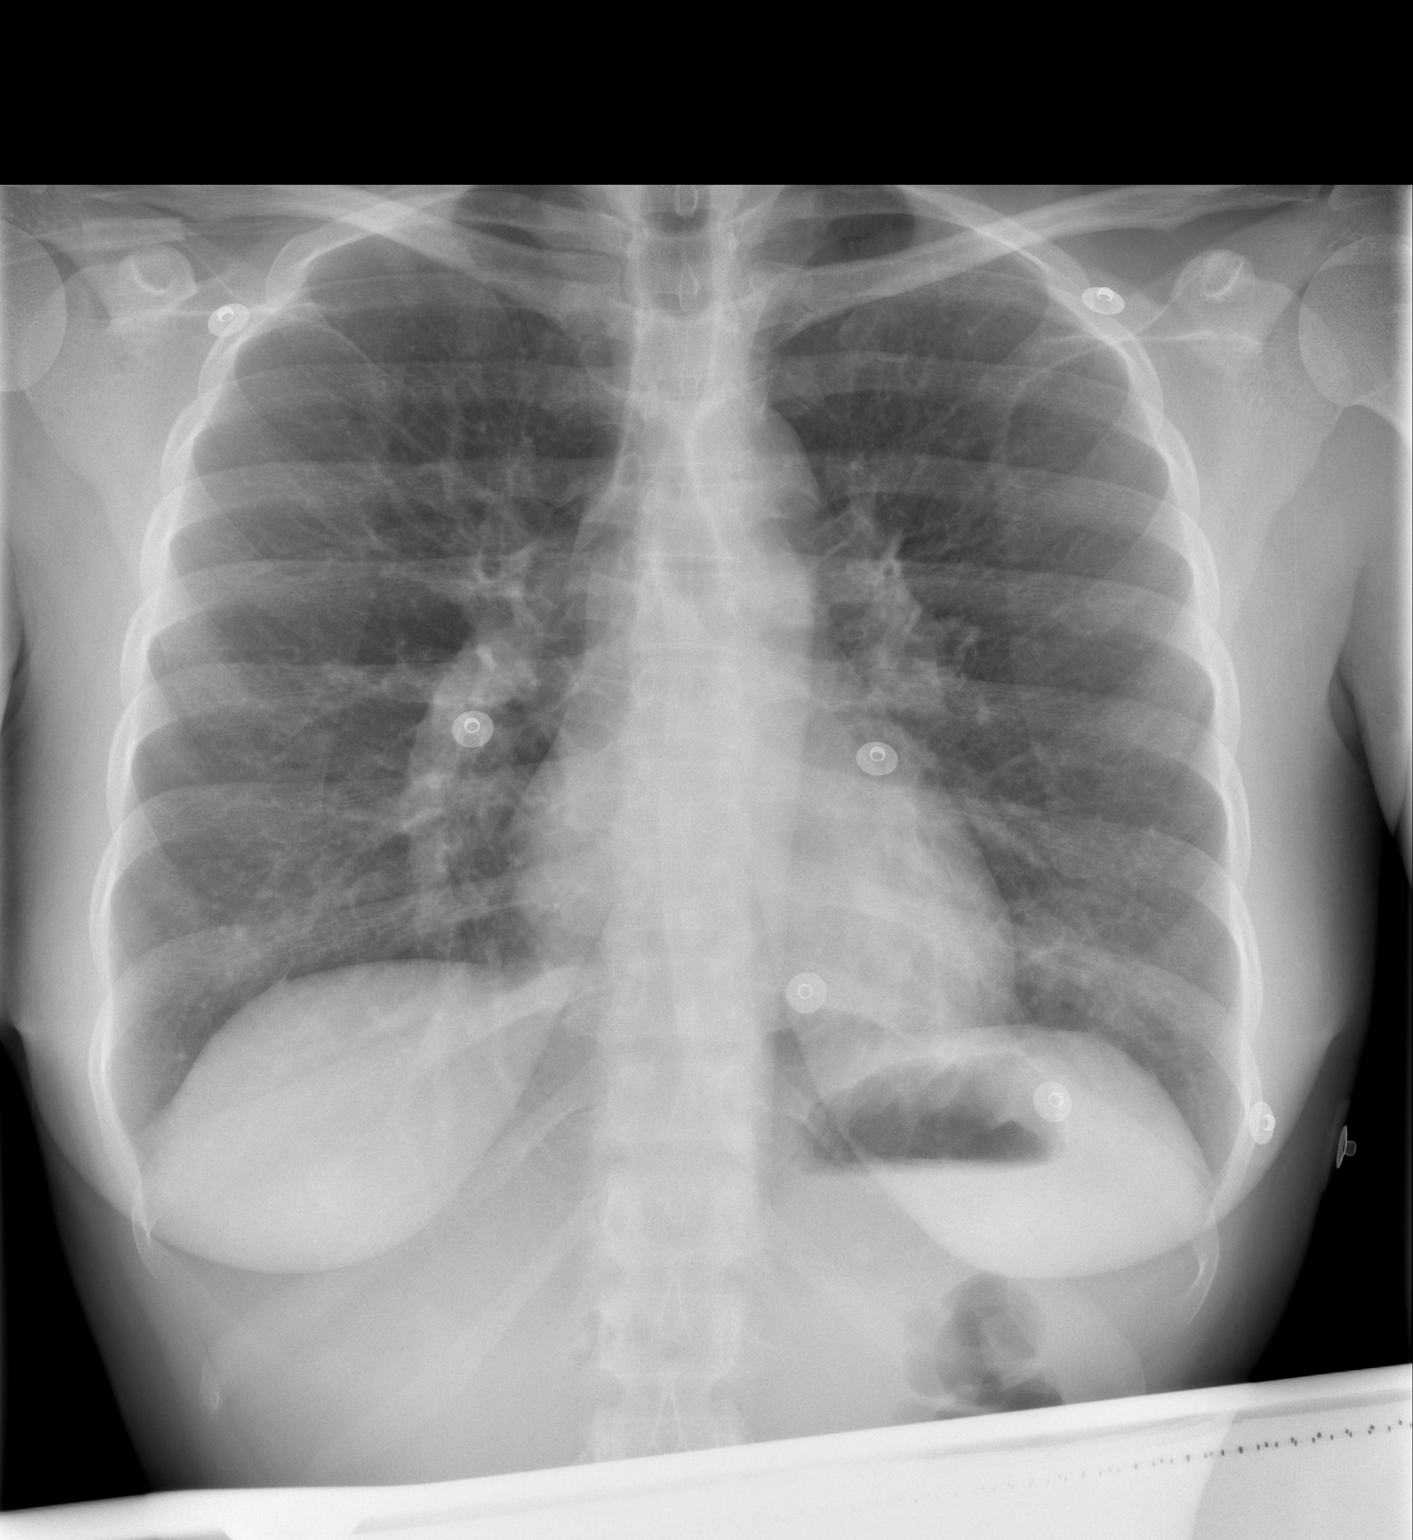

[w chest lat]
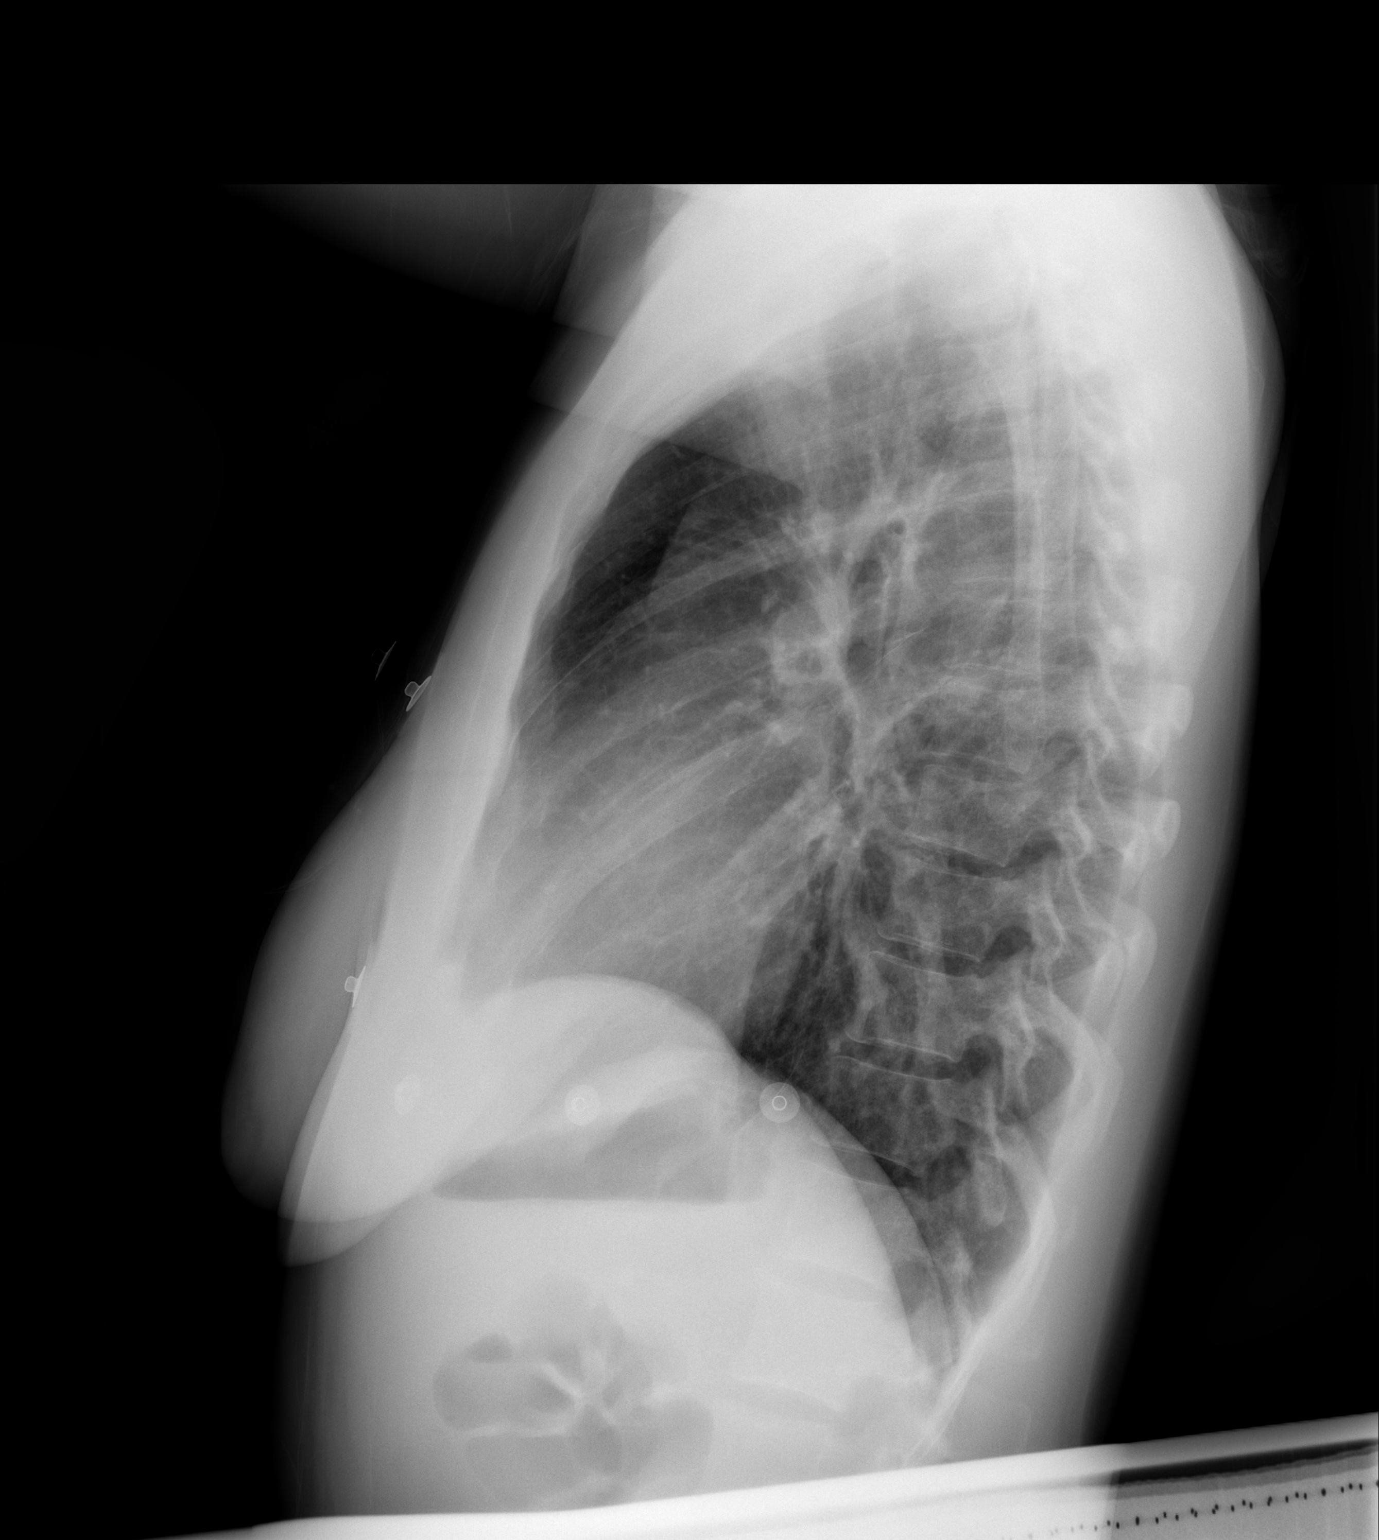

[2 of 2 positions shown; findings below may reference images not displayed]

FINDINGS: Heart and mediastinal contours are stable and within
normal limits.  A vague, patchy opacity in the lingula is noted,
for which early airspace disease is not excluded.  There is no
pleural effusion.  The right lung is clear.  No acute osseous
abnormalities appreciated.
IMPRESSION: Vague, asymmetric patchy density in the region of the lingula.
Early pneumonia is not excluded and correlation with signs and
symptoms is suggested.

## 2008-03-08 ENCOUNTER — Emergency Department (HOSPITAL_COMMUNITY): Admission: EM | Admit: 2008-03-08 | Discharge: 2008-03-08 | Payer: Self-pay | Admitting: Family Medicine

## 2008-04-12 ENCOUNTER — Emergency Department (HOSPITAL_COMMUNITY): Admission: EM | Admit: 2008-04-12 | Discharge: 2008-04-12 | Payer: Self-pay | Admitting: Emergency Medicine

## 2008-04-17 ENCOUNTER — Emergency Department (HOSPITAL_COMMUNITY): Admission: EM | Admit: 2008-04-17 | Discharge: 2008-04-17 | Payer: Self-pay | Admitting: Emergency Medicine

## 2008-04-17 IMAGING — CR DG ABDOMEN ACUTE W/ 1V CHEST
4 series · 4 of 4 positions shown · non-contrast
Comparison: Chest x-ray of [DATE]

CLINICAL DATA: Abdominal pain, diarrhea, smoking history

ACUTE ABDOMEN SERIES (ABDOMEN 2 VIEW & CHEST 1 VIEW)

[w chest pa]
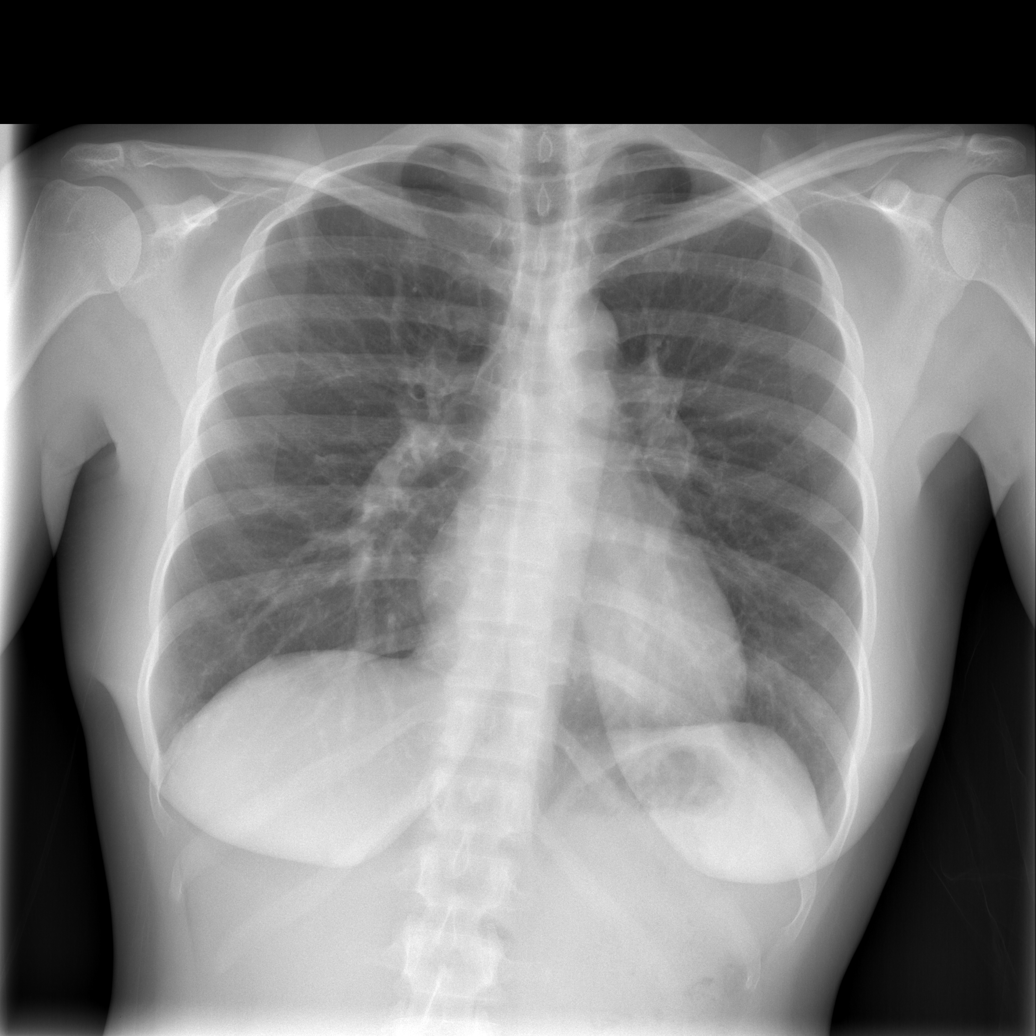

[w abdomen upright *]
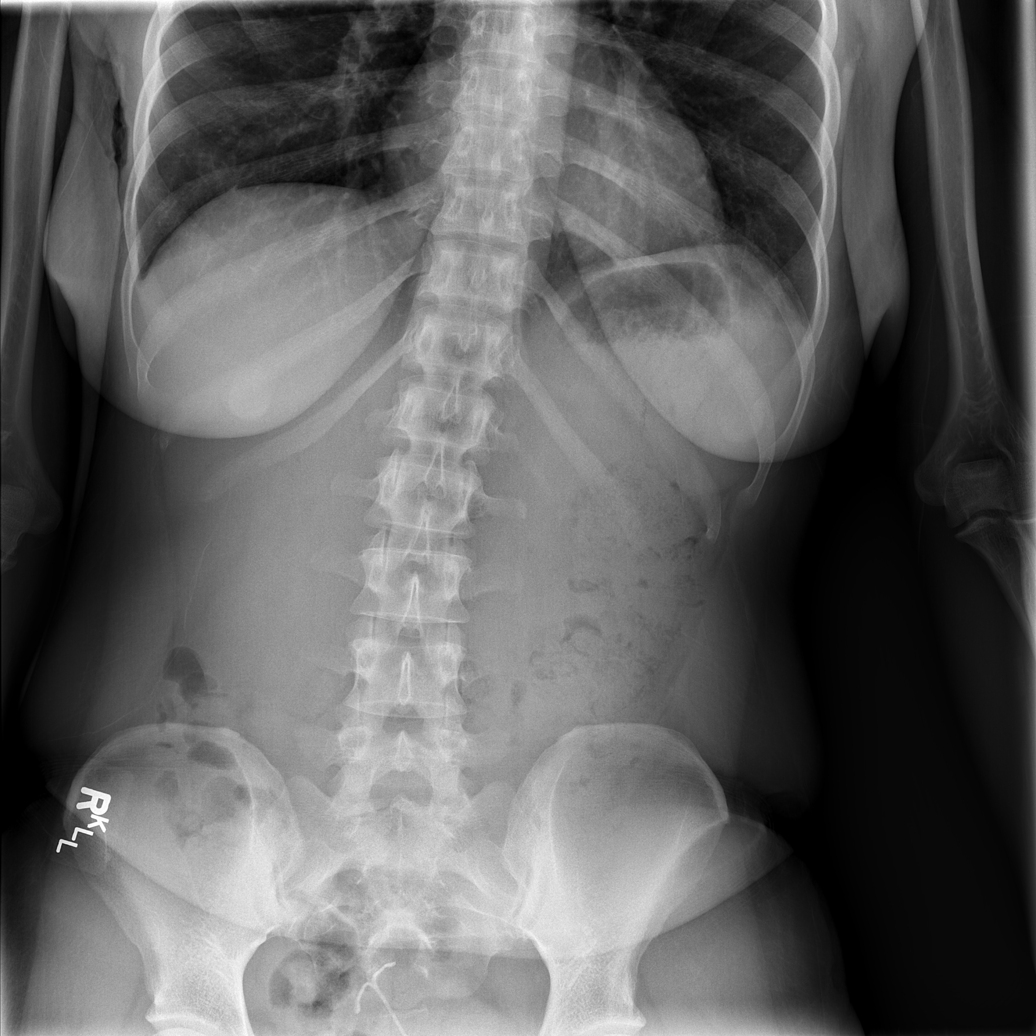

[t abdomen supine (1 of 2)]
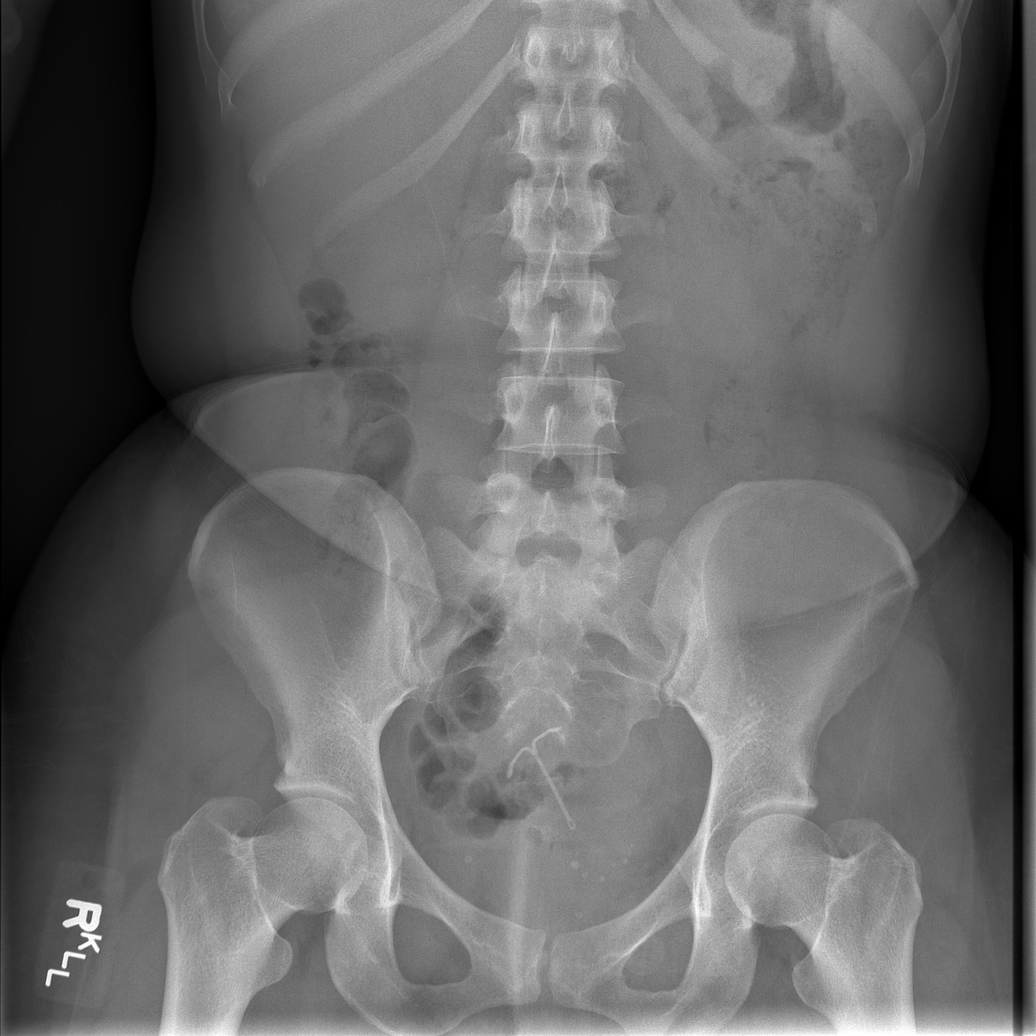

[t abdomen supine (2 of 2)]
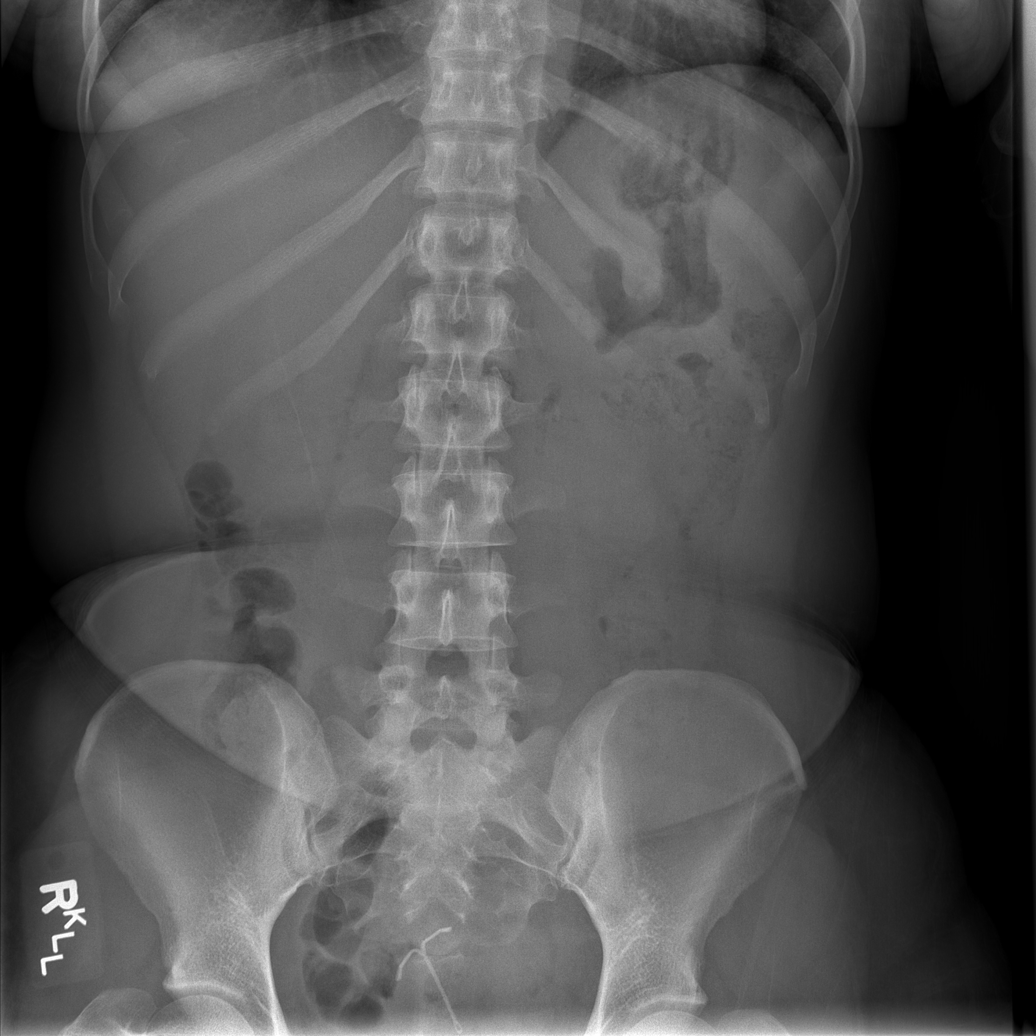

[4 of 4 positions shown; findings below may reference images not displayed]

FINDINGS: No active infiltrate or effusion is seen.  Heart is
within normal limits in size.  Mild peribronchial thickening
remains.

Supine and erect views of the abdomen show no bowel obstruction and
no free air.  A moderate amount of feces is noted throughout the
colon.  IUD is present in the mid line of the pelvis.  No opaque
calculi are seen.
IMPRESSION: 1.  No active lung disease.  Mild peribronchial thickening.
2.  No obstruction or free air.  Moderate amount of feces
throughout the colon.

## 2008-04-19 ENCOUNTER — Emergency Department (HOSPITAL_COMMUNITY): Admission: EM | Admit: 2008-04-19 | Discharge: 2008-04-19 | Payer: Self-pay | Admitting: Emergency Medicine

## 2008-05-10 ENCOUNTER — Emergency Department (HOSPITAL_COMMUNITY): Admission: EM | Admit: 2008-05-10 | Discharge: 2008-05-10 | Payer: Self-pay | Admitting: Emergency Medicine

## 2008-05-10 IMAGING — CT CT ABDOMEN W/O CM
1 series · 15 of 32 positions shown, 19 images · non-contrast
Comparison: None

CT ABDOMEN

CLINICAL DATA: Right-sided abdominal pain and left lower quadrant
pain and nausea

CT ABDOMEN AND PELVIS WITHOUT CONTRAST
TECHNIQUE: Multidetector CT imaging of the abdomen and pelvis was
performed following the standard
protocol without intravenous contrast.

[Series 2: 160 stone 5.0 b40f st · axial · 0.73mm/px · z∈[-458,-133]mm · 15 of 73 slices shown, 19 images]
[im 5/73  soft-tissue]
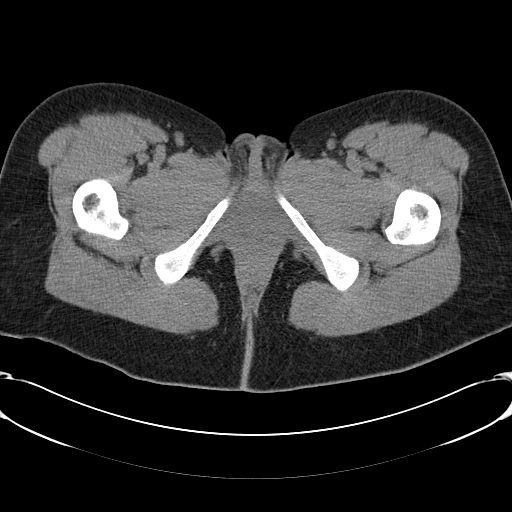
[im 5/73  bone]
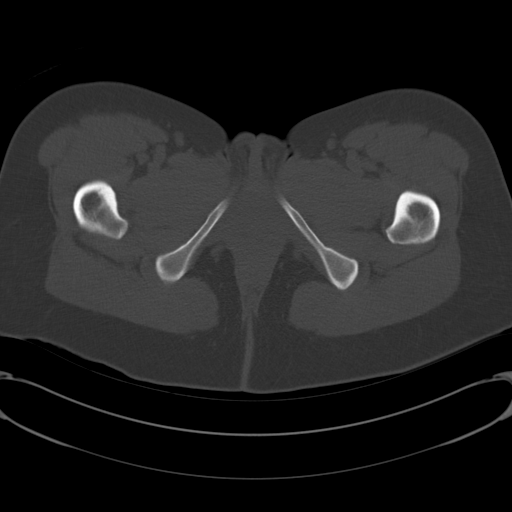
[im 10/73  soft-tissue]
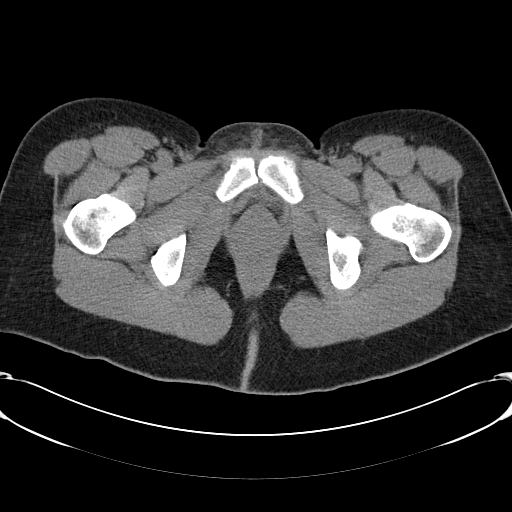
[im 14/73  soft-tissue]
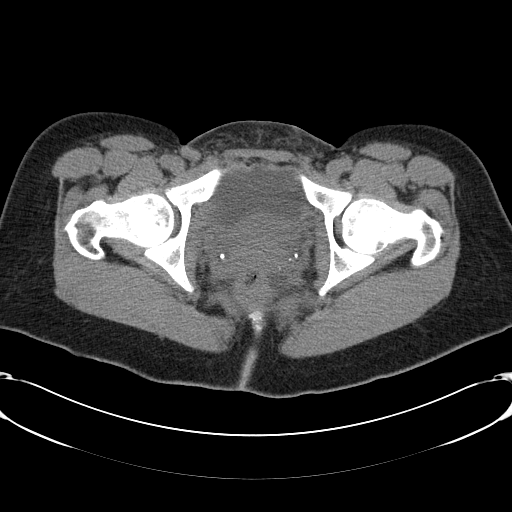
[im 21/73  soft-tissue]
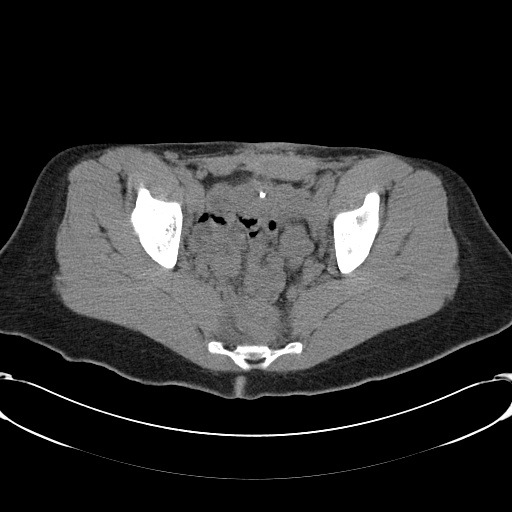
[im 26/73  soft-tissue]
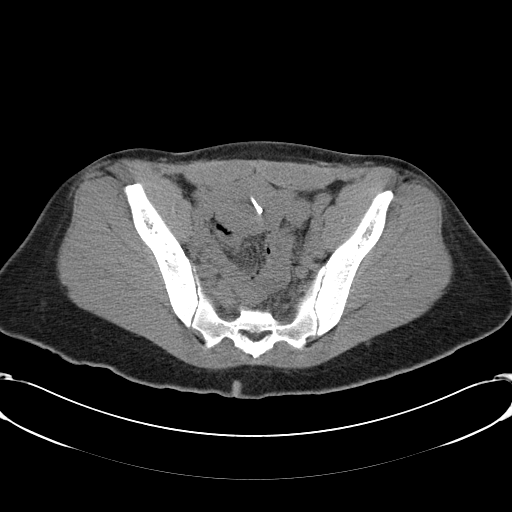
[im 31/73  soft-tissue]
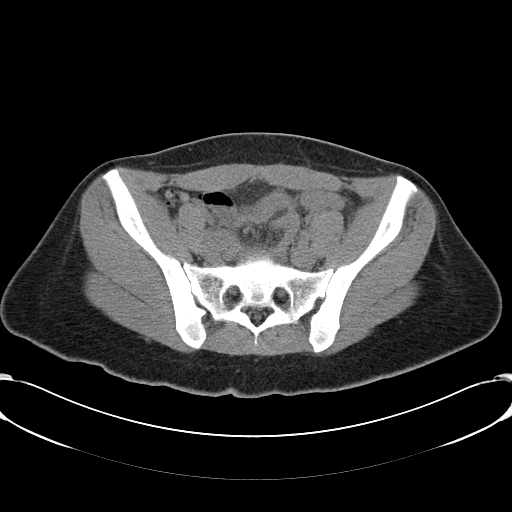
[im 38/73  soft-tissue]
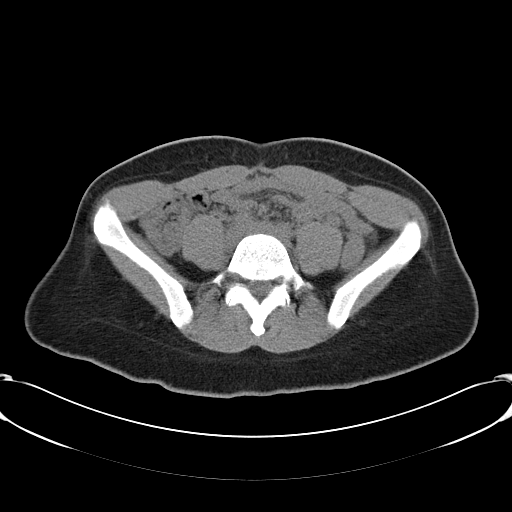
[im 42/73  soft-tissue]
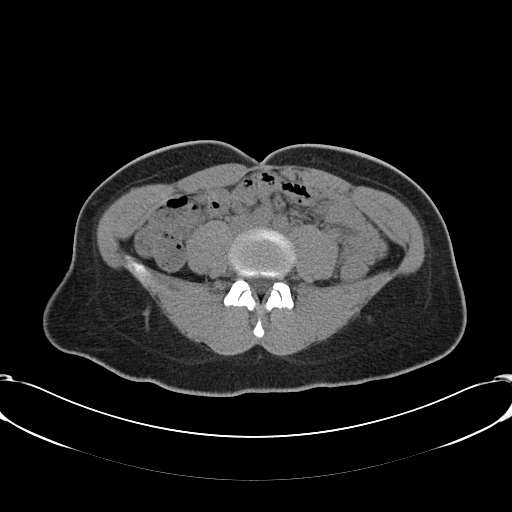
[im 47/73  soft-tissue]
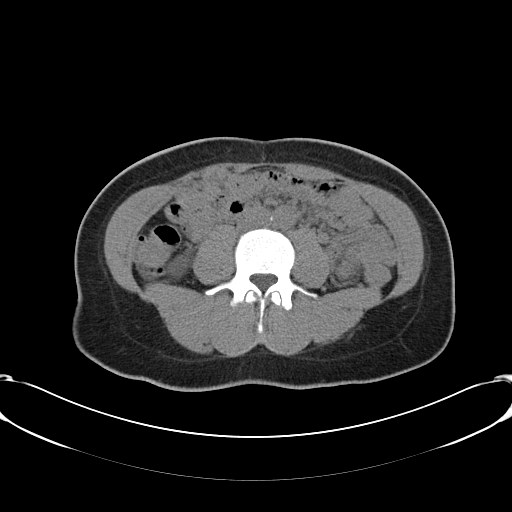
[im 47/73  bone]
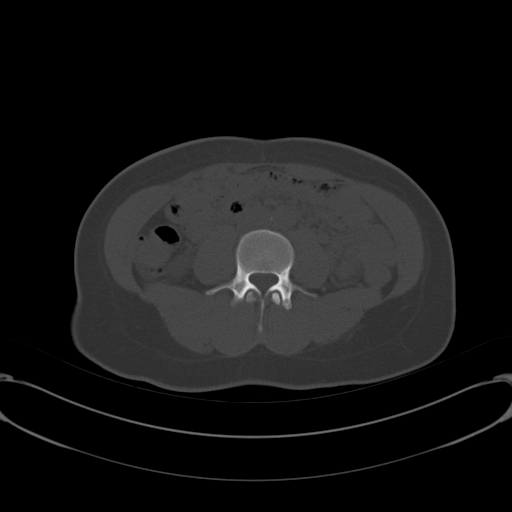
[im 52/73  soft-tissue]
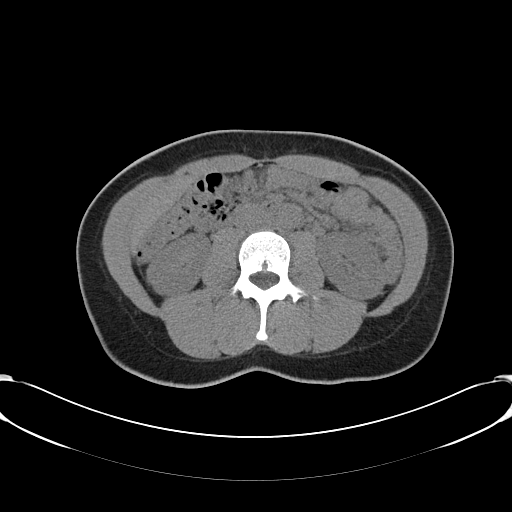
[im 59/73  soft-tissue]
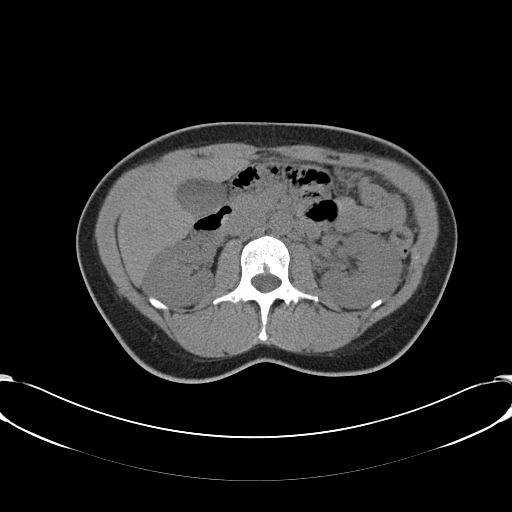
[im 63/73  soft-tissue]
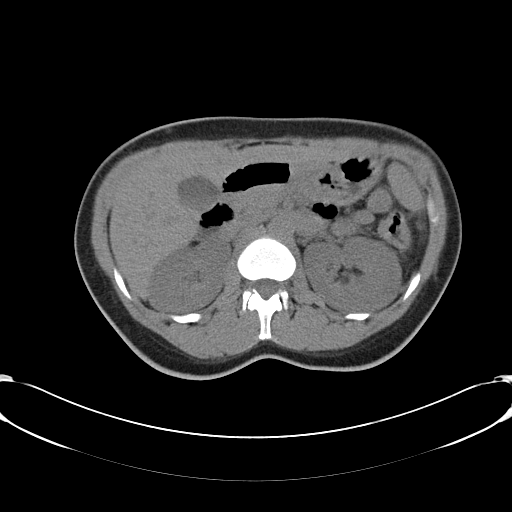
[im 63/73  lung]
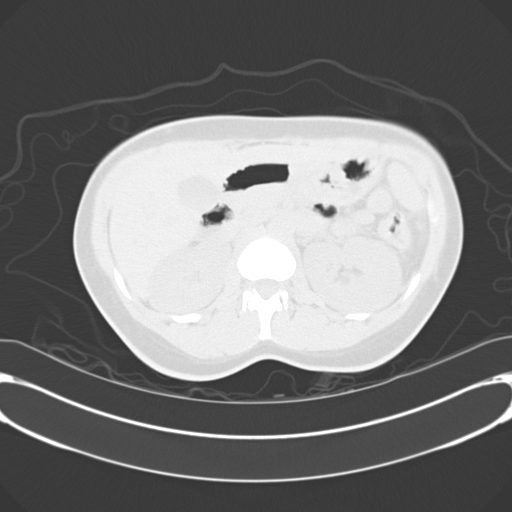
[im 66/73  lung]
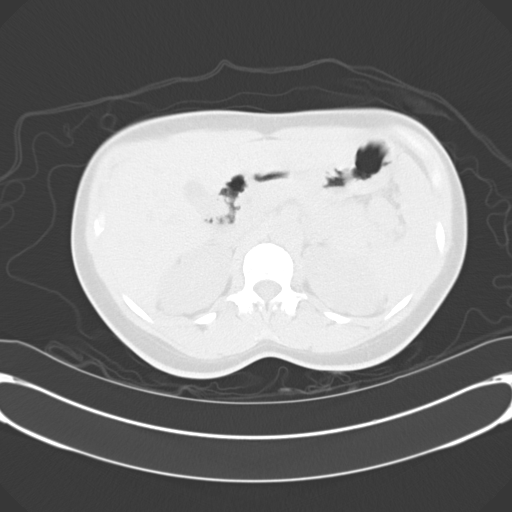
[im 68/73  soft-tissue]
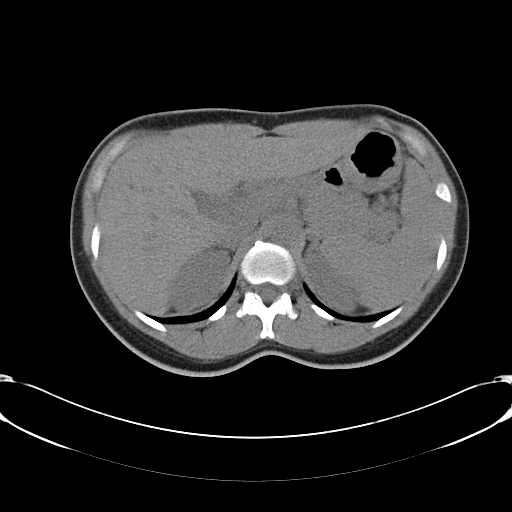
[im 68/73  lung]
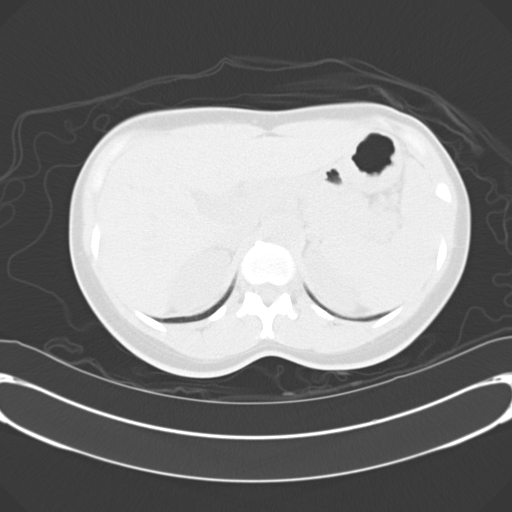
[im 70/73  lung]
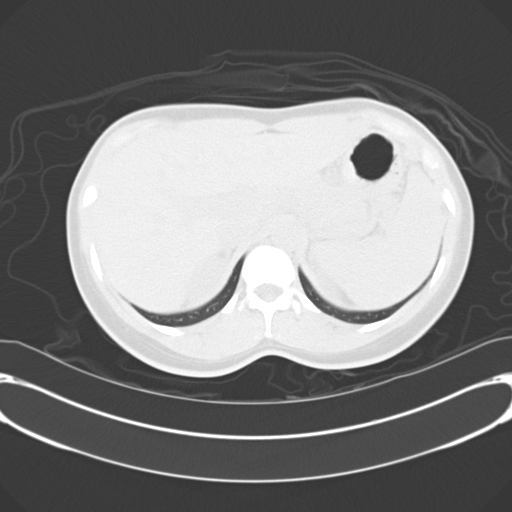

[15 of 32 positions shown; findings below may reference images not displayed]

FINDINGS: The visualized lung bases are clear.  The unenhanced
appearance of the liver and spleen are unremarkable.  The pancreas
is grossly normal.  The adrenal glands and kidneys demonstrate no
significant findings.  Specifically, I do not see any renal calculi
or renal obstructive findings.

The stomach, duodenum, small bowel and colon unremarkable the study
is limited without oral contrast.  The gallbladder appears normal.
No mesenteric or retroperitoneal masses or adenopathy.  The aorta
is normal in caliber.  No significant bony findings.
IMPRESSION: 1.  Unremarkable noncontrast CT examination of the abdomen

CT PELVIS
FINDINGS: The appendix is visualized and is normal.  Multiple
pelvic calcifications are likely phleboliths.  No definite
obstructing ureteral calculi or bladder calculi.  There is a small
amount of probable physiologic free pelvic fluid.  The patient has
an IUD in place.  Part of this is outside the endometrial cavity
and is in the myometrium close to the serosal surface.

The rectum, sigmoid colon and visualized small bowel loops are
grossly normal.  No significant bony findings.  Small inguinal
lymph nodes are noted.  No mass or hernia.
IMPRESSION: 1.  Part of the IUD is projecting outside the endometrial cavity
coursing through the myometrium, near the serosal surface.
2.  No definite obstructing ureteral calculi.
3.  Multiple phlebolithic calcifications in the pelvis.
4.  Small amount of free pelvic fluid.
5.  Normal appendix.

## 2008-05-27 ENCOUNTER — Emergency Department (HOSPITAL_COMMUNITY): Admission: EM | Admit: 2008-05-27 | Discharge: 2008-05-27 | Payer: Self-pay | Admitting: Emergency Medicine
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
## 2008-06-06 ENCOUNTER — Emergency Department (HOSPITAL_COMMUNITY): Admission: EM | Admit: 2008-06-06 | Discharge: 2008-06-06 | Payer: Self-pay | Admitting: Emergency Medicine

## 2008-06-22 ENCOUNTER — Emergency Department (HOSPITAL_COMMUNITY): Admission: EM | Admit: 2008-06-22 | Discharge: 2008-06-22 | Payer: Self-pay | Admitting: Emergency Medicine

## 2008-08-30 ENCOUNTER — Emergency Department (HOSPITAL_COMMUNITY): Admission: EM | Admit: 2008-08-30 | Discharge: 2008-08-30 | Payer: Self-pay | Admitting: Emergency Medicine
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
## 2008-11-04 ENCOUNTER — Emergency Department (HOSPITAL_COMMUNITY): Admission: EM | Admit: 2008-11-04 | Discharge: 2008-11-05 | Payer: Self-pay | Admitting: Emergency Medicine

## 2008-11-05 IMAGING — US US TRANSVAGINAL NON-OB
1 series · 14 of 25 positions shown · non-contrast
Comparison: CT [DATE]

CLINICAL DATA: 32-year-old female with pelvic pain.

TRANSABDOMINAL AND TRANSVAGINAL ULTRASOUND OF PELVIS
TECHNIQUE: Both transabdominal and transvaginal ultrasound
examinations of the pelvis were performed including evaluation of
the uterus, ovaries, adnexal regions, and pelvic cul-de-sac.

[Series 1: us transvaginal non-ob · 0.21mm/px · 14 of 52 slices shown]
[im 1/52]
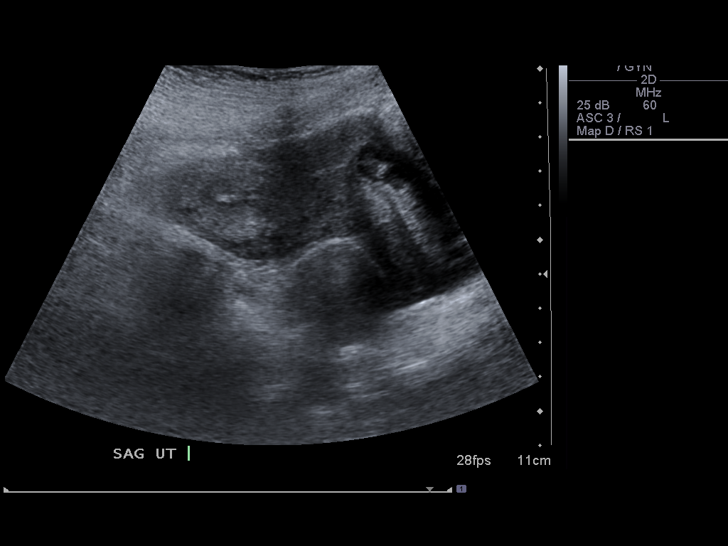
[im 5/52]
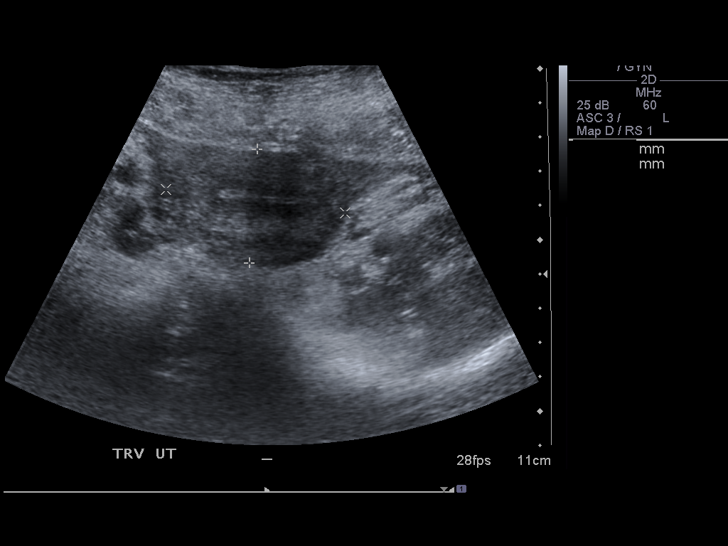
[im 9/52]
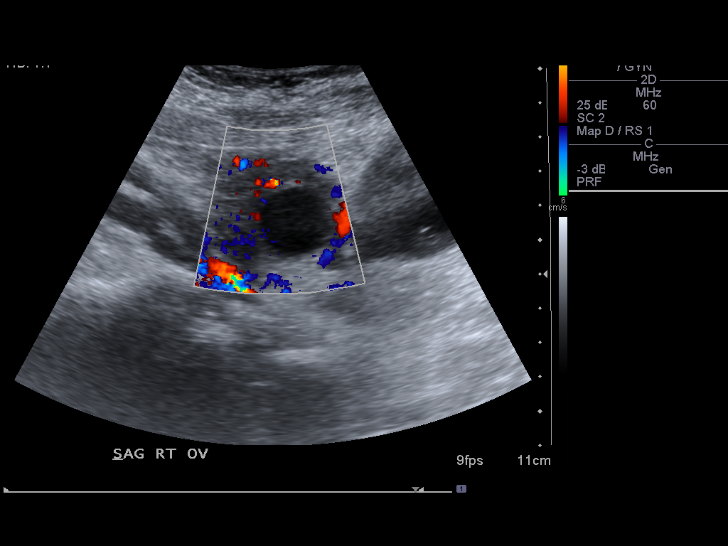
[im 13/52]
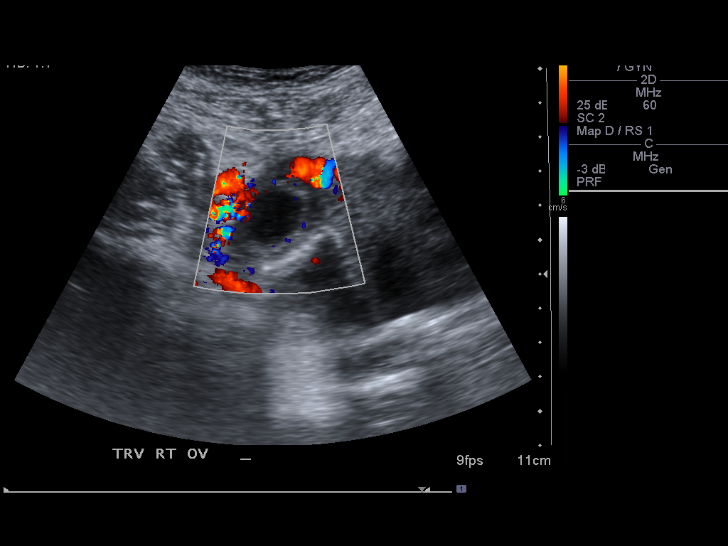
[im 18/52]
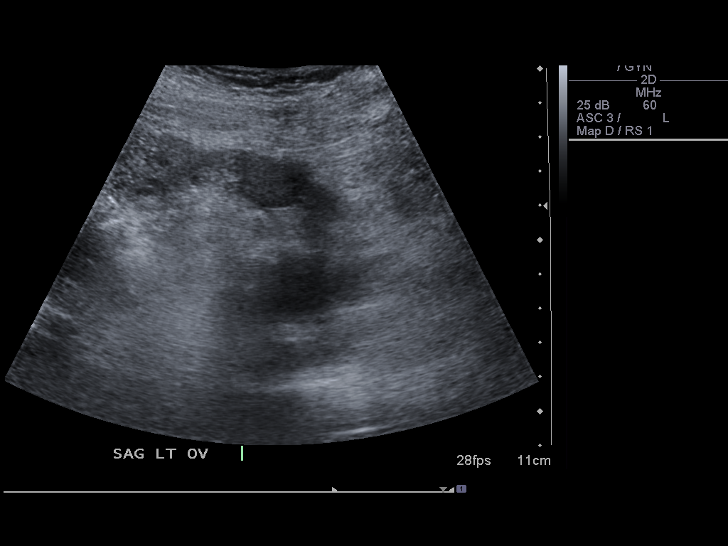
[im 20/52]
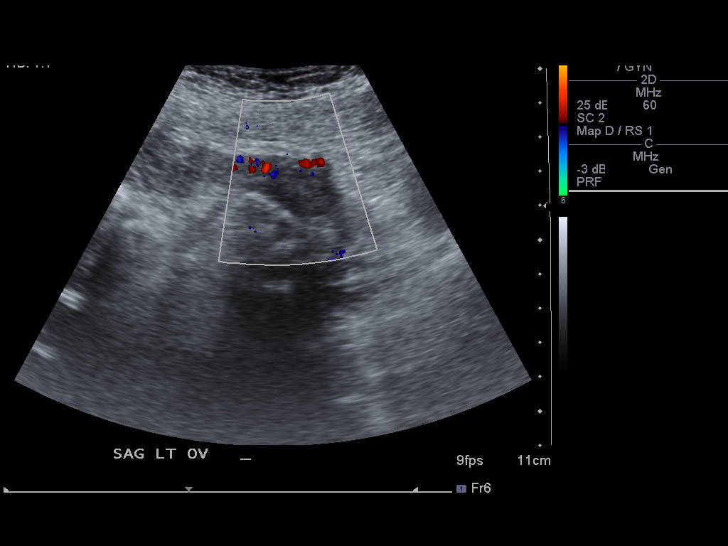
[im 24/52]
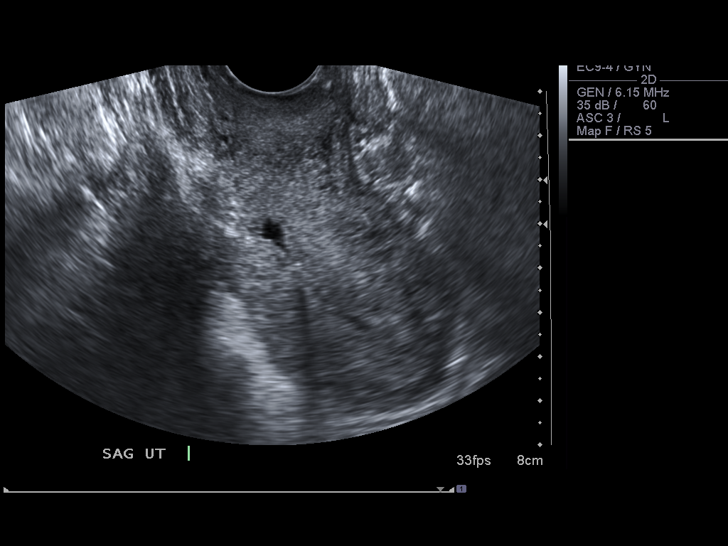
[im 28/52]
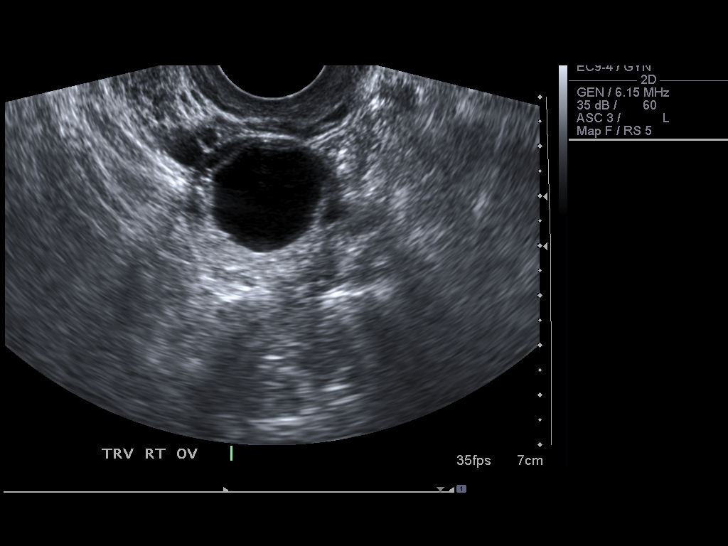
[im 32/52]
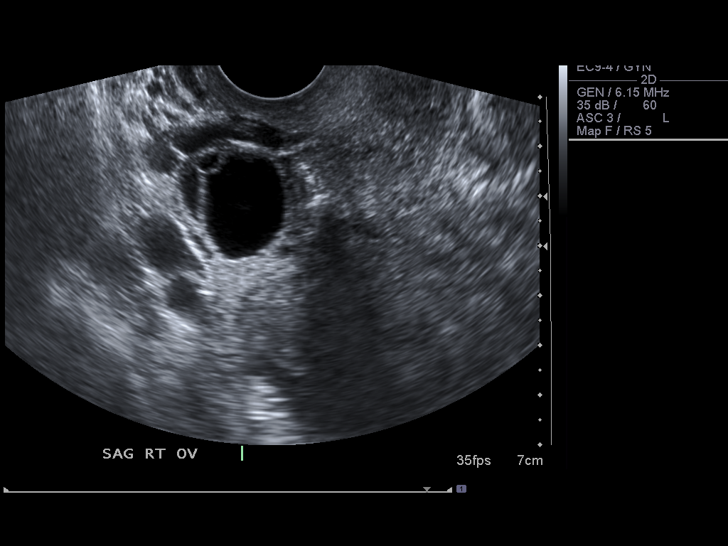
[im 35/52]
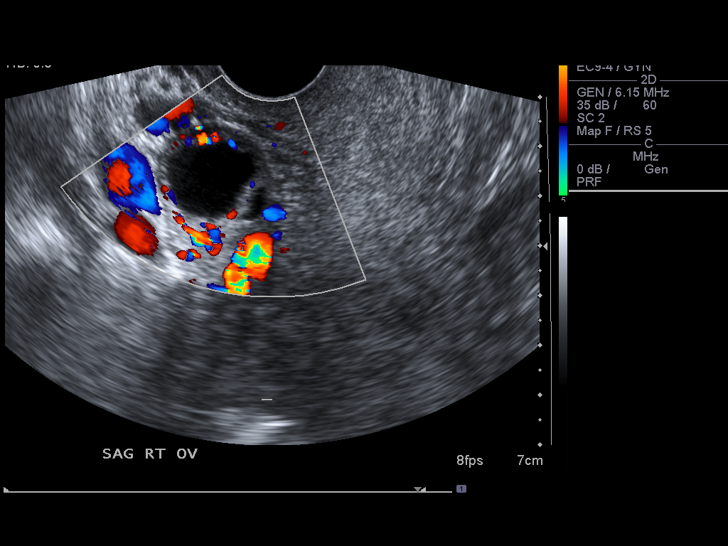
[im 39/52]
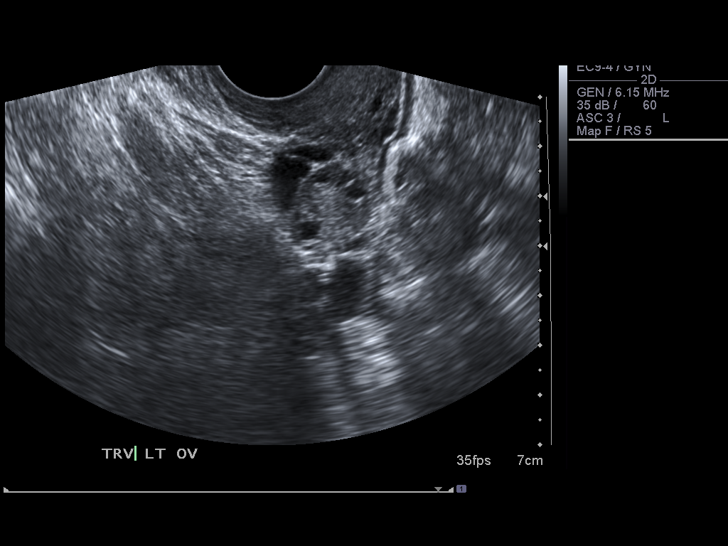
[im 43/52]
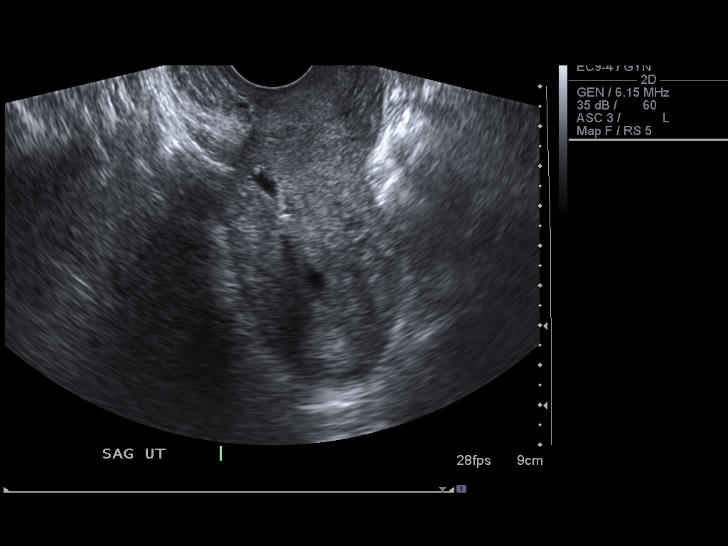
[im 47/52]
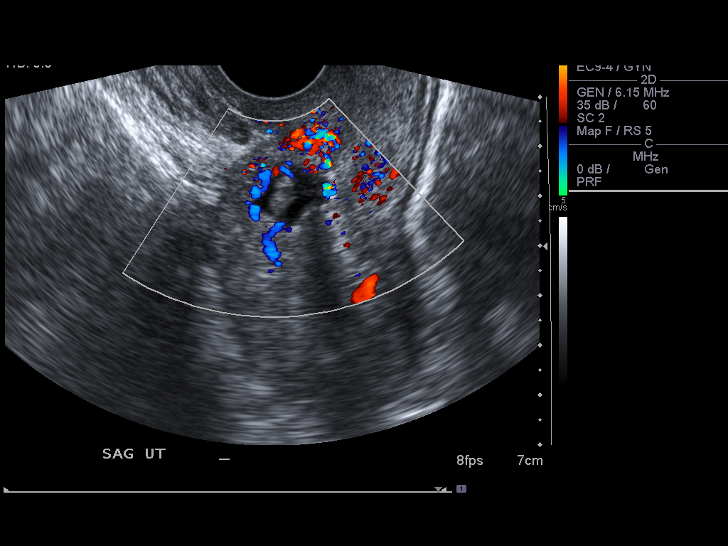
[im 52/52]
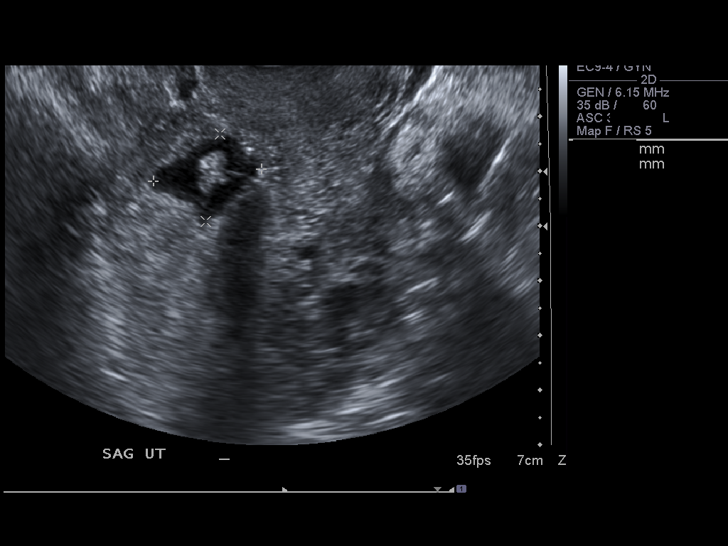

[14 of 25 positions shown; findings below may reference images not displayed]

FINDINGS: The uterus is retroverted measuring 8.9 x 5.30 x 5.3 cm.
The endometrium is normal at 3 mm.  There is a small 1.9 x
angular fluid collection within the anterior wall of the uterus.
Within this fluid collection there is a small  8 mm soft tissue
density.

The right ovary measures 3.1 x 3.0 x 2.9 cm with a 2 cm dominant
follicle.

The left ovary measures 2.4 x 2.0 x 1.5 cm.

No free fluid the posterior cul-de-sac.
IMPRESSION: 1.  Small irregular fluid collection within the anterior wall of
the uterine wall with central soft tissue density.  This is of
unclear etiology and may be related to an IUD which appeared to
extend the through myometrium on comparison CT of [DATE].
2.  Otherwise no acute pelvic abnormality.
3.  Dominant follicle within the right ovary.

## 2008-11-19 ENCOUNTER — Emergency Department (HOSPITAL_COMMUNITY): Admission: EM | Admit: 2008-11-19 | Discharge: 2008-11-19 | Payer: Self-pay | Admitting: Emergency Medicine

## 2008-11-25 ENCOUNTER — Emergency Department (HOSPITAL_COMMUNITY): Admission: EM | Admit: 2008-11-25 | Discharge: 2008-11-25 | Payer: Self-pay | Admitting: Emergency Medicine

## 2008-11-25 IMAGING — CT CT ABDOMEN W/O CM
2 of 4 series · 17 of 46 positions shown, 19 images · non-contrast
Comparison: [DATE]

CT ABDOMEN

CLINICAL DATA: Left low back pain

CT ABDOMEN AND PELVIS WITHOUT CONTRAST
TECHNIQUE: Multidetector CT imaging of the abdomen and pelvis was
performed following the standard protocol without intravenous
contrast.

[Series 2: start just above kidney's · axial · 0.70mm/px · z∈[-426,-71]mm · 14 of 79 slices shown, 16 images]
[im 4/79  soft-tissue]
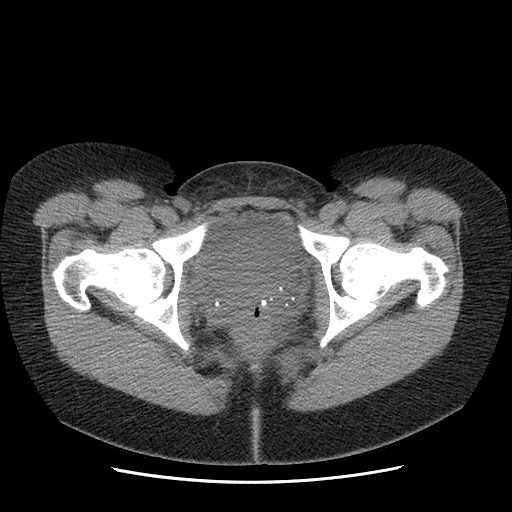
[im 4/79  bone]
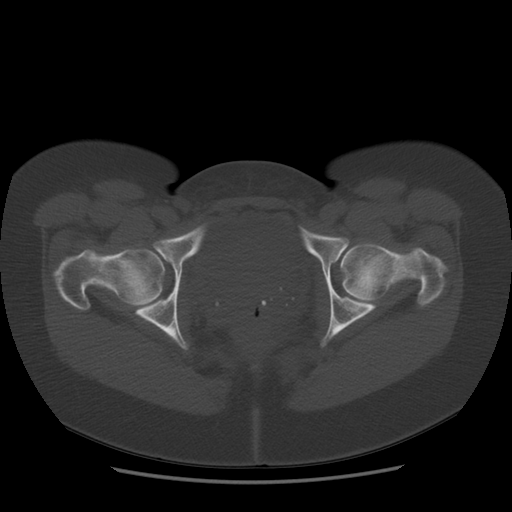
[im 11/79  soft-tissue]
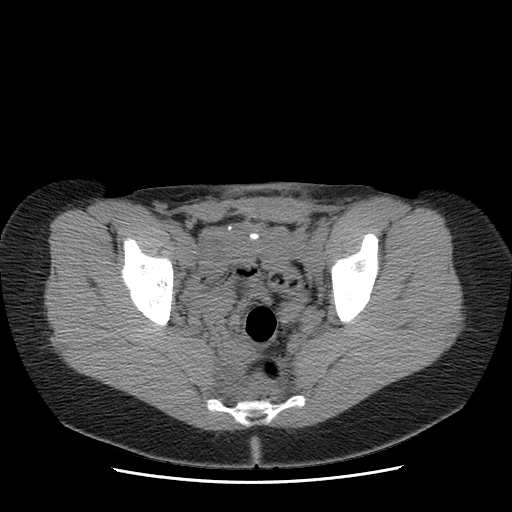
[im 14/79  soft-tissue]
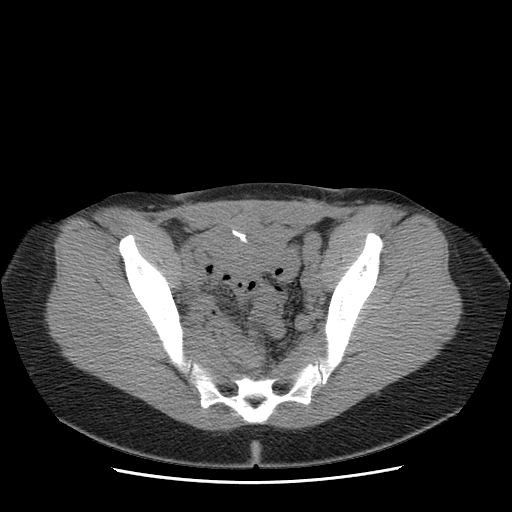
[im 21/79  soft-tissue]
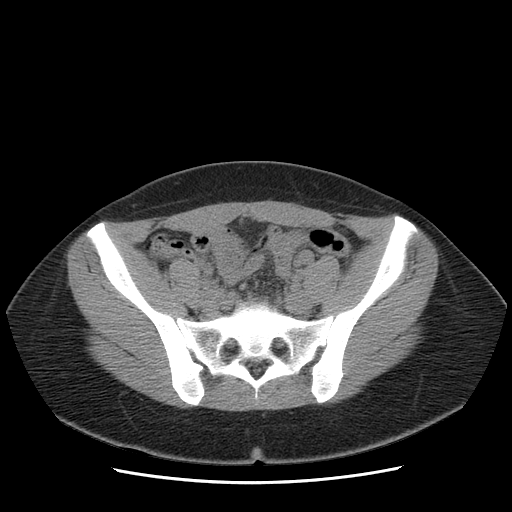
[im 28/79  soft-tissue]
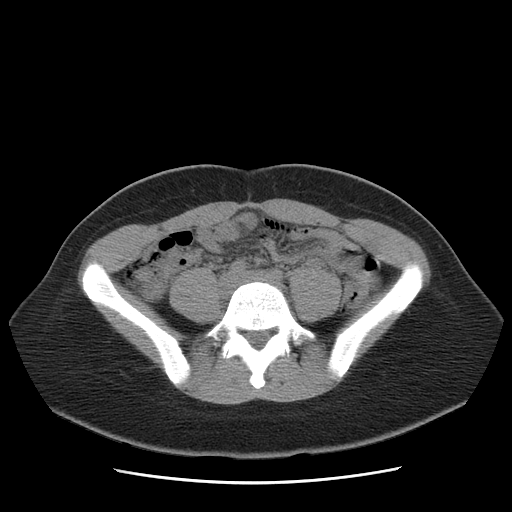
[im 31/79  soft-tissue]
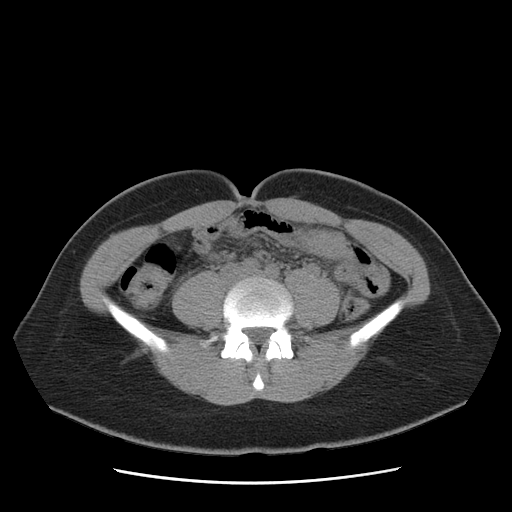
[im 38/79  soft-tissue]
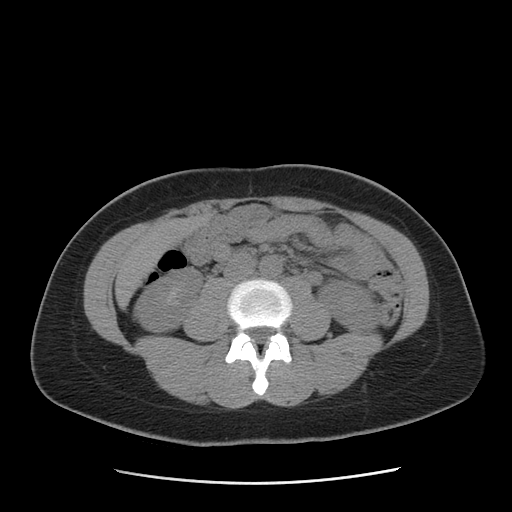
[im 41/79  soft-tissue]
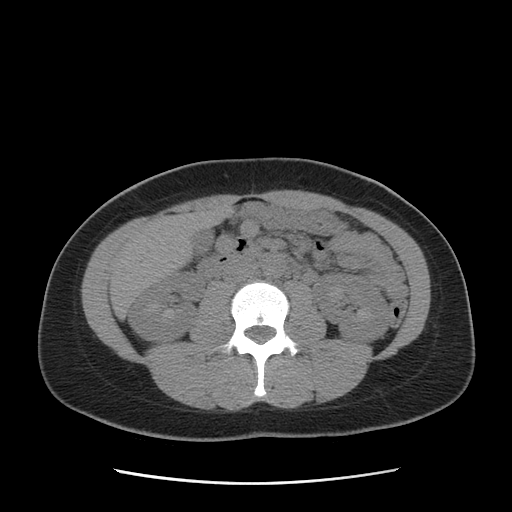
[im 48/79  soft-tissue]
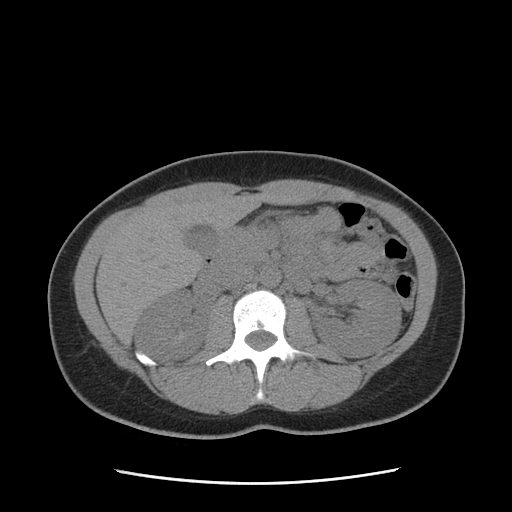
[im 48/79  bone]
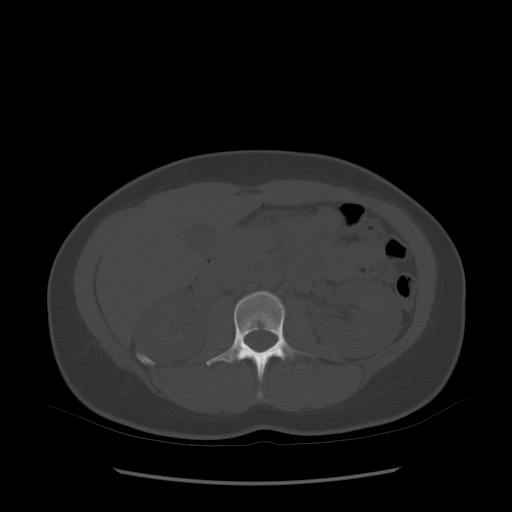
[im 51/79  soft-tissue]
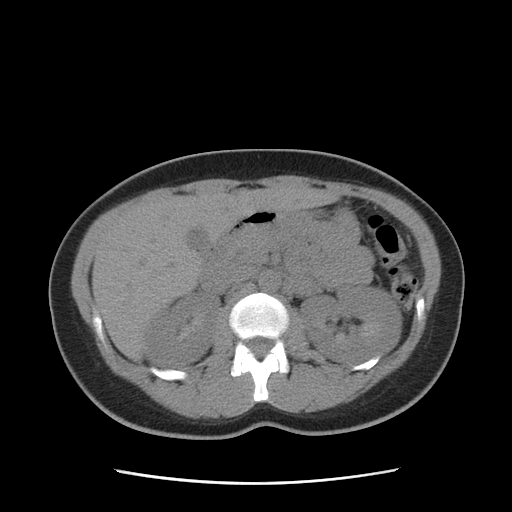
[im 58/79  soft-tissue]
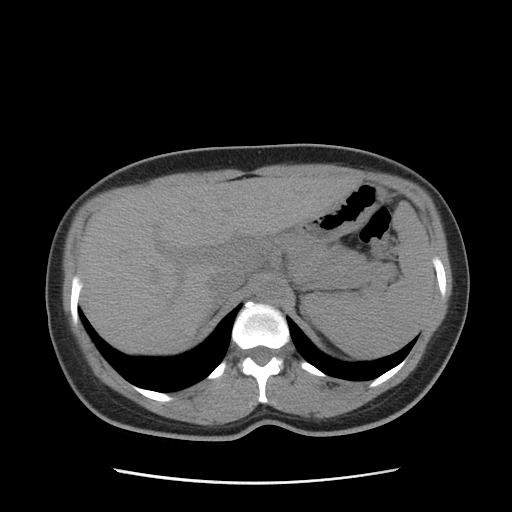
[im 65/79  soft-tissue]
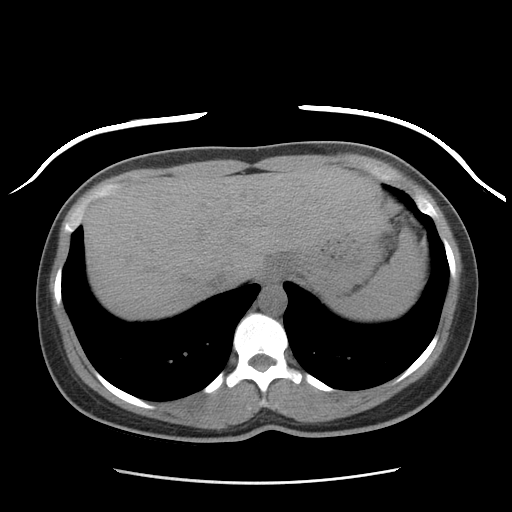
[im 68/79  soft-tissue]
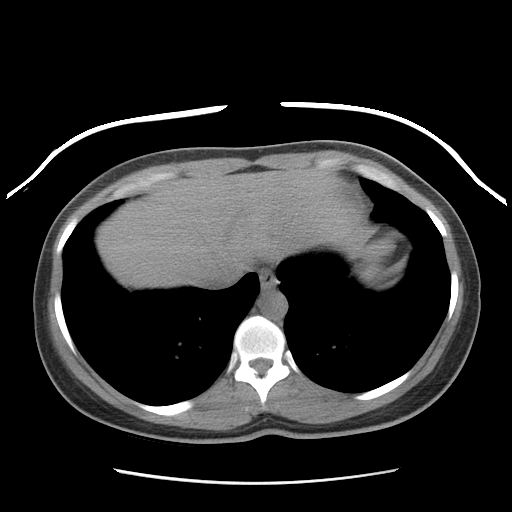
[im 75/79  soft-tissue]
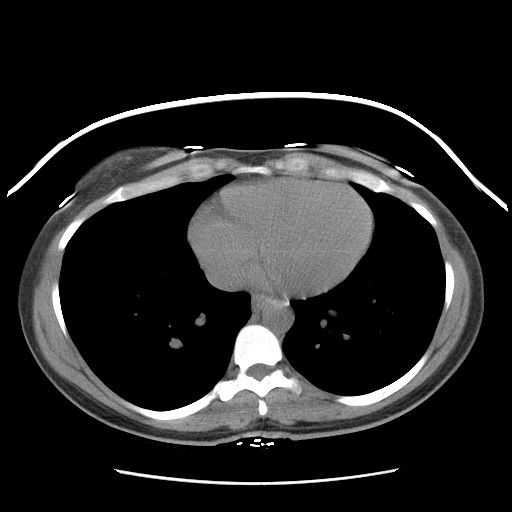

[Series 401: cor · coronal · 0.88mm/px · 3 of 77 slices shown]
[im 26/77  soft-tissue]
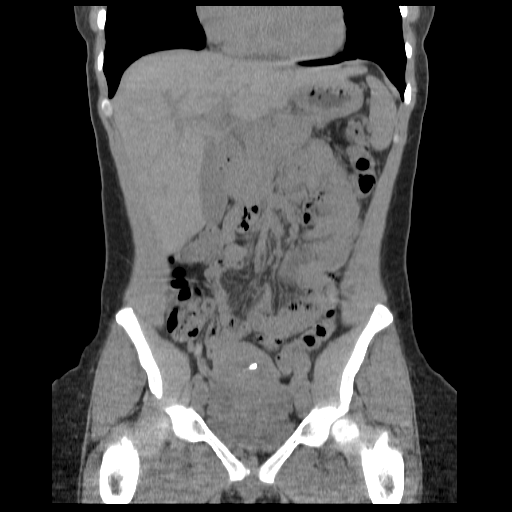
[im 34/77  soft-tissue]
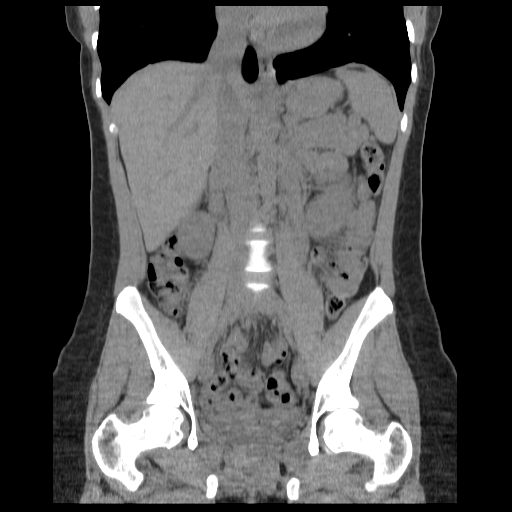
[im 43/77  soft-tissue]
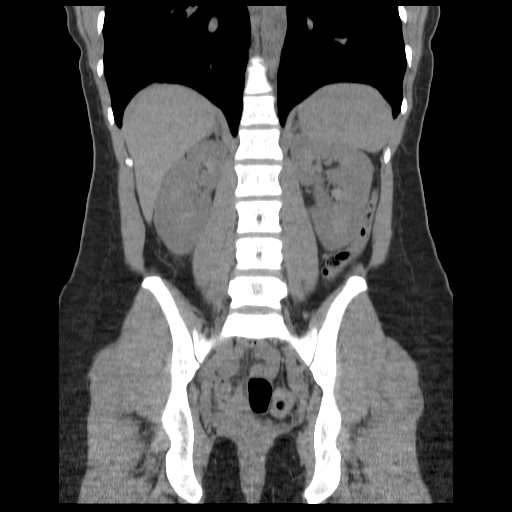

[17 of 46 positions shown; findings below may reference images not displayed]

FINDINGS: No hydronephrosis or asymmetrical perinephric stranding.
No the urinary calculus is present in the abdomen.

The unenhanced liver, gallbladder, spleen, adrenal glands, and
pancreas are within normal limits.  Negative free fluid.
IMPRESSION: No urinary calculus or hydronephrosis.

CT PELVIS
FINDINGS: Multiple phleboliths are stable.  The IUD device is
stable in position.  One segment of the IUD device does extend to
the serosa of the anterior lower uterus.  Bladder is decompressed.
No definite ureteral calculus.  Trace free fluid in the right side
of the pelvis is likely physiologic.
IMPRESSION: No urinary calculus.

Stable position of the IUD device which does extend to the serosa
of the uterus.

## 2008-12-29 ENCOUNTER — Ambulatory Visit: Payer: Self-pay | Admitting: Advanced Practice Midwife

## 2008-12-29 ENCOUNTER — Inpatient Hospital Stay (HOSPITAL_COMMUNITY): Admission: AD | Admit: 2008-12-29 | Discharge: 2008-12-30 | Payer: Self-pay | Admitting: Obstetrics & Gynecology

## 2008-12-30 IMAGING — US US PELVIS COMPLETE MODIFY
1 series · 13 of 25 positions shown · non-contrast
Comparison: CT abdomen [DATE]

CLINICAL DATA: Abdominal pain.

TRANSABDOMINAL AND TRANSVAGINAL ULTRASOUND OF PELVIS
TECHNIQUE: Both transabdominal and transvaginal ultrasound
examinations of the pelvis were performed including evaluation of
the uterus, ovaries, adnexal regions, and pelvic cul-de-sac.

[Series 1: us pelvis complete · 13 of 49 slices shown]
[im 1/49]
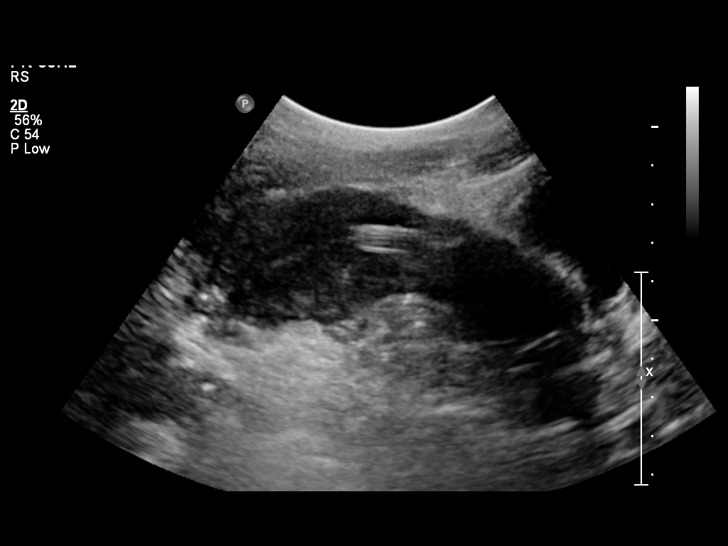
[im 5/49]
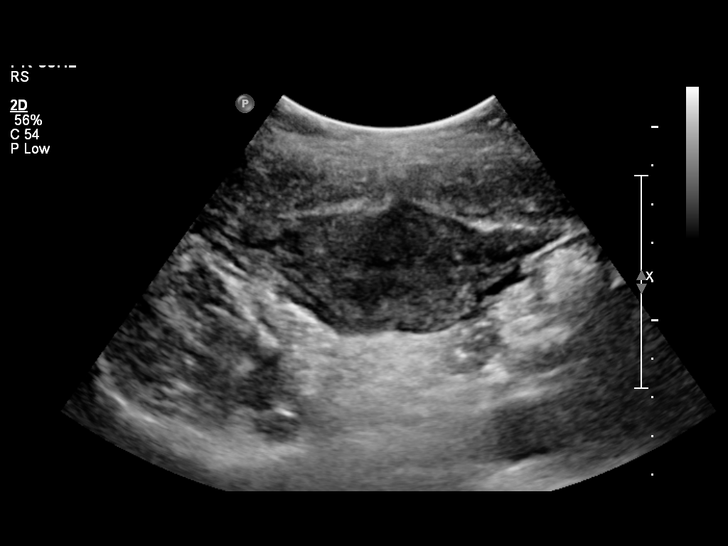
[im 9/49]
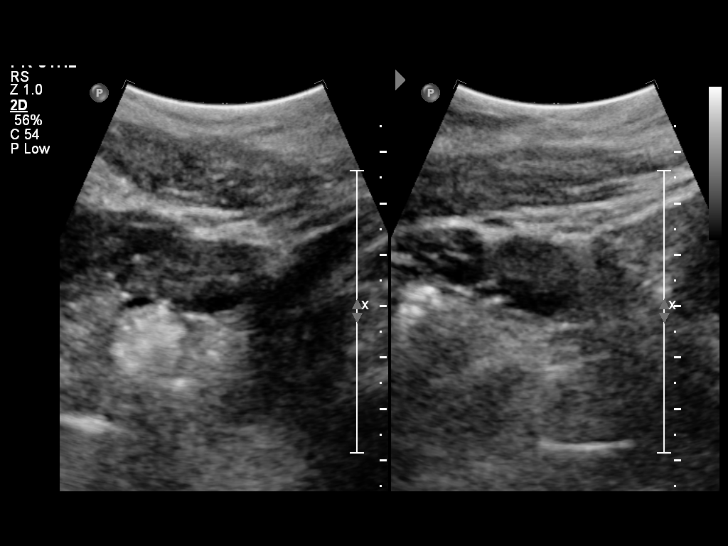
[im 13/49]
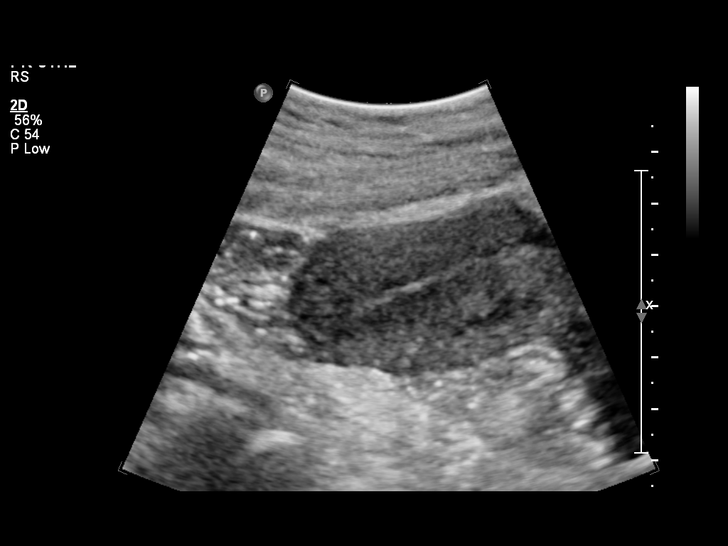
[im 17/49]
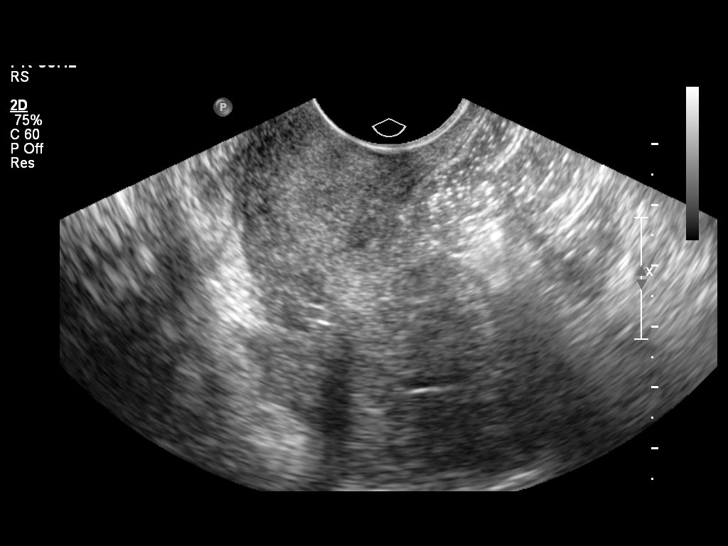
[im 21/49]
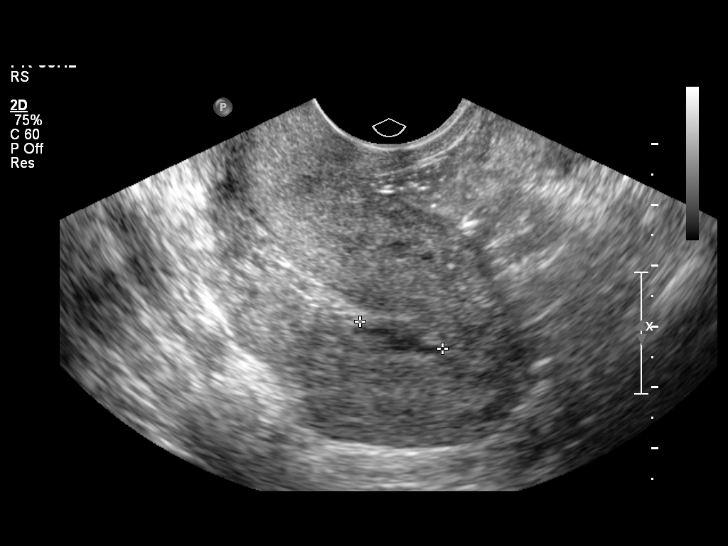
[im 25/49]
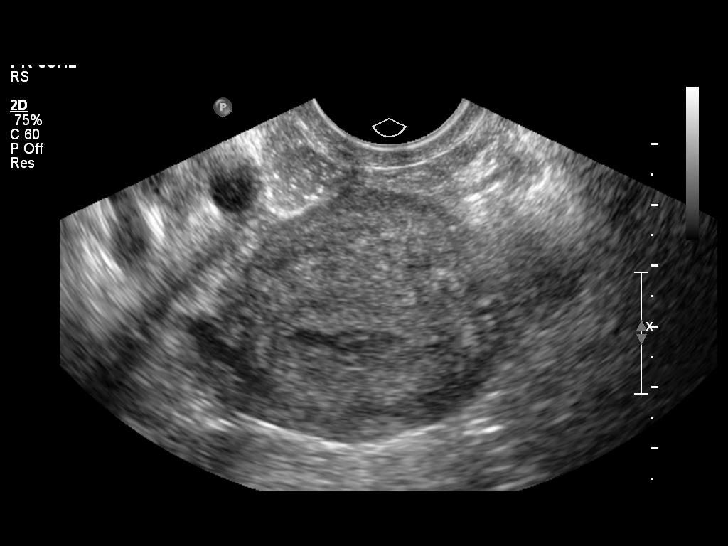
[im 29/49]
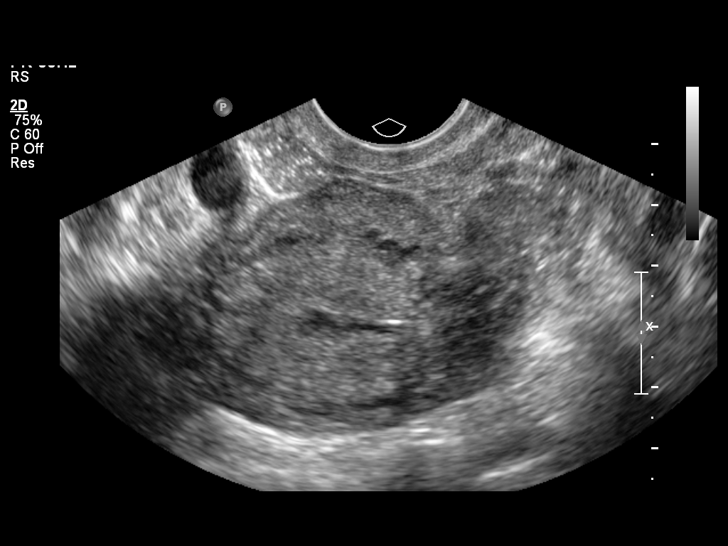
[im 33/49]
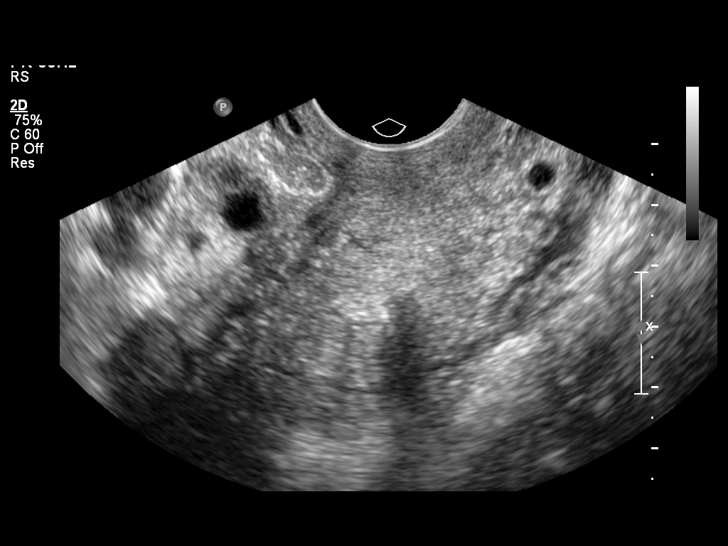
[im 37/49]
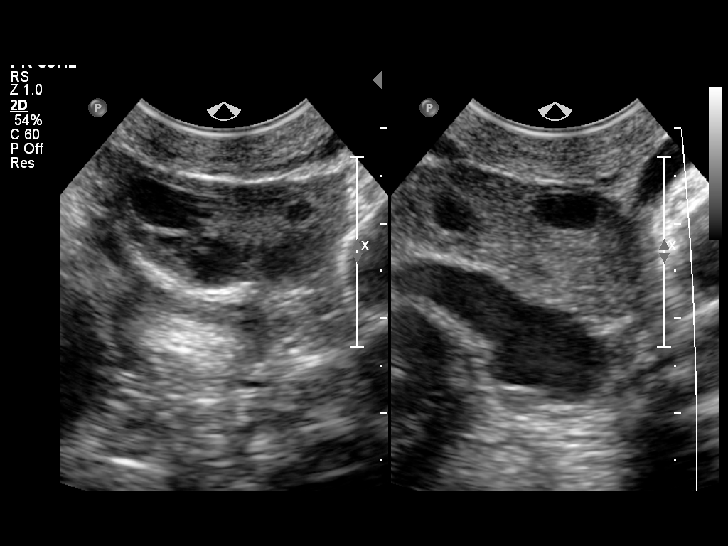
[im 41/49]
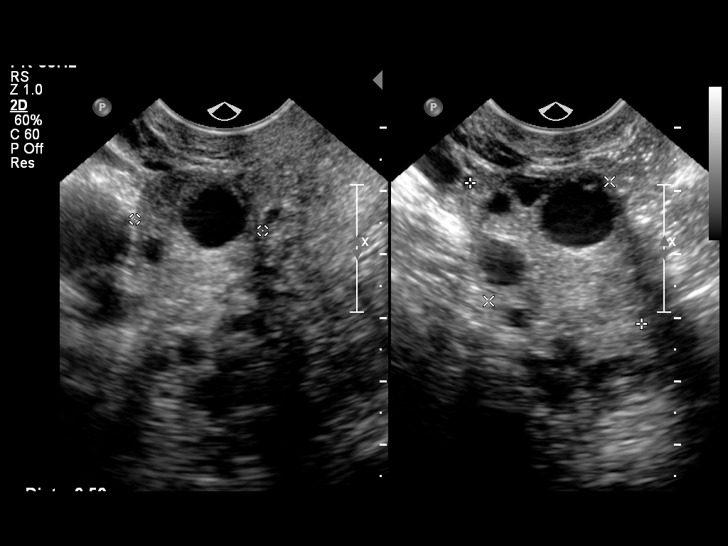
[im 45/49]
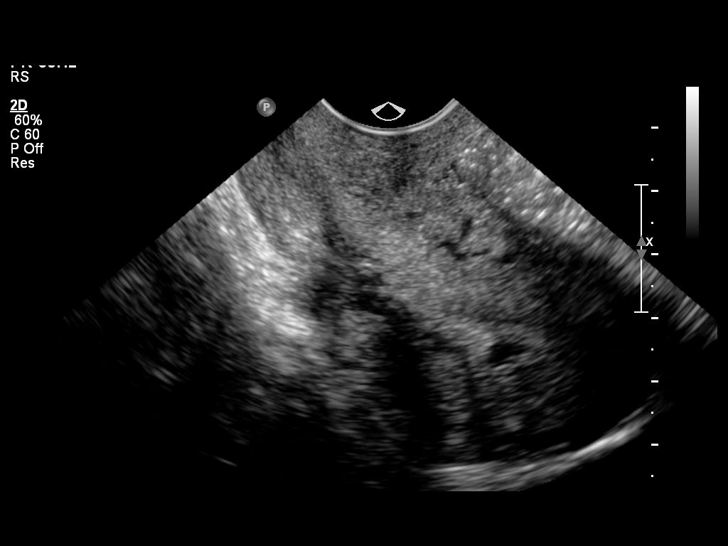
[im 49/49]
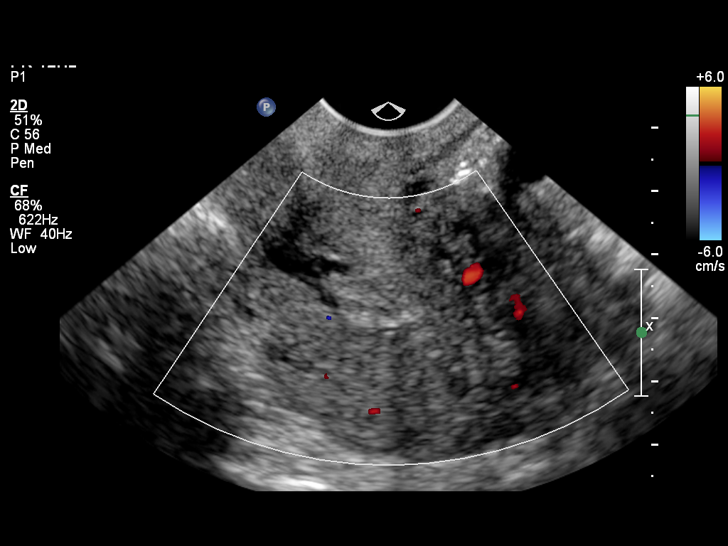

[13 of 25 positions shown; findings below may reference images not displayed]

FINDINGS: Uterus:  Uterus is retroflexed.  Uterus measures 7.1 x 3.7 x
cm.

Endometrium:  2.5 mm.  IUD is present on image number 16.  IUD is
in the mid to lower uterine segment.  Tiny amount of fluid is
present in the uterine canal, located near the fundus.  This
measures under a centimeter in thickness and 1.4 cm in length.
There is some central echogenicity which may represent clot.  A
tiny polyp could produce a similar appearance.

Right Ovary:  Right ovary measures 3.5 x 2.7 x 2.0 cm, with normal
physiologic appearance.  Cysts/follicles are present.

Left Ovary:  2.8 x 1.5 x 2.1 cm.  Physiologic appearance.
Cyst/follicles are present.

Other Findings:  None. No free fluid in the anatomic pelvis.
IMPRESSION: 1.  IUD present within the lower uterine segment.
2.  Small amount of fluid present within the uterine canal near the
fundus.  This could represent hemorrhage with a small amount of
clot.  Another possibility is endometrial polyp.  Follow-up
ultrasound recommended in 1 month to reassess.  If abnormality
remains present, [facility] could be considered.

## 2009-01-03 ENCOUNTER — Emergency Department (HOSPITAL_COMMUNITY): Admission: EM | Admit: 2009-01-03 | Discharge: 2009-01-03 | Payer: Self-pay | Admitting: Emergency Medicine

## 2009-02-26 ENCOUNTER — Emergency Department (HOSPITAL_COMMUNITY): Admission: EM | Admit: 2009-02-26 | Discharge: 2009-02-26 | Payer: Self-pay | Admitting: Emergency Medicine

## 2009-02-26 IMAGING — CT CT PELVIS W/O CM
2 of 4 series · 17 of 46 positions shown, 19 images · non-contrast
Comparison: [DATE]

CT ABDOMEN

CLINICAL DATA: Left flank pain.  Urinary frequency.

CT ABDOMEN AND PELVIS WITHOUT CONTRAST
TECHNIQUE: Multidetector CT imaging of the abdomen and pelvis was
performed following the standard protocol without intravenous
contrast.

[Series 2: renal stone · axial · 0.65mm/px · z∈[-388,-103]mm · 14 of 63 slices shown, 16 images]
[im 3/63  soft-tissue]
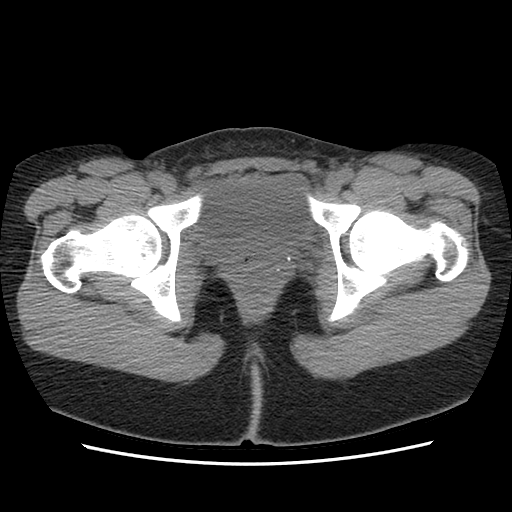
[im 3/63  bone]
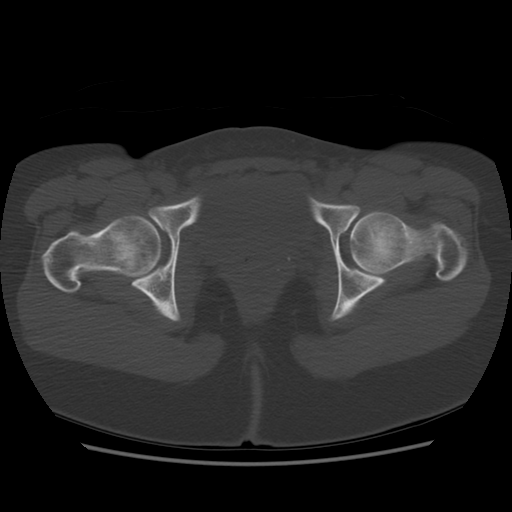
[im 8/63  soft-tissue]
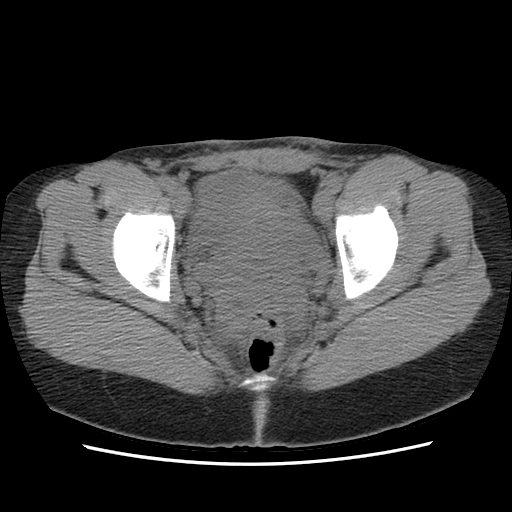
[im 13/63  soft-tissue]
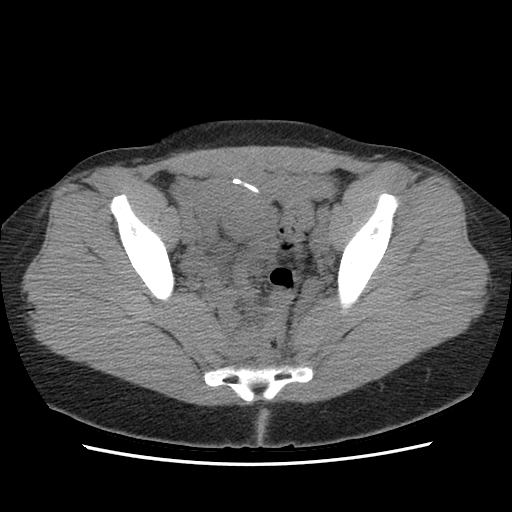
[im 18/63  soft-tissue]
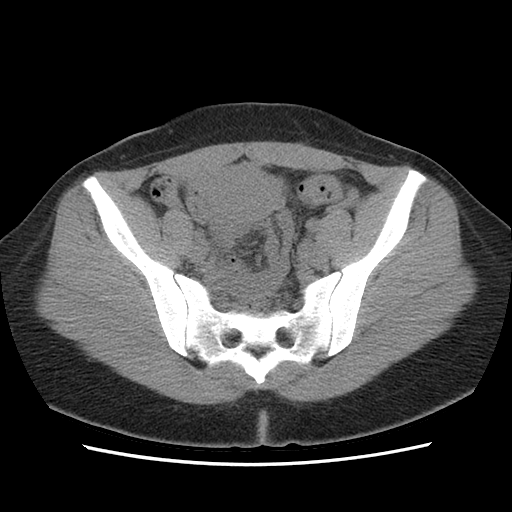
[im 20/63  soft-tissue]
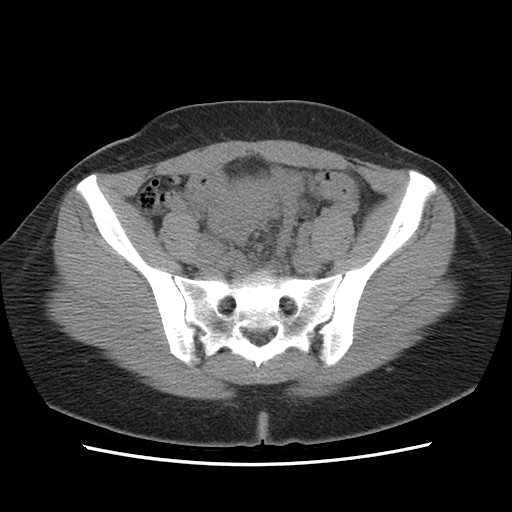
[im 25/63  soft-tissue]
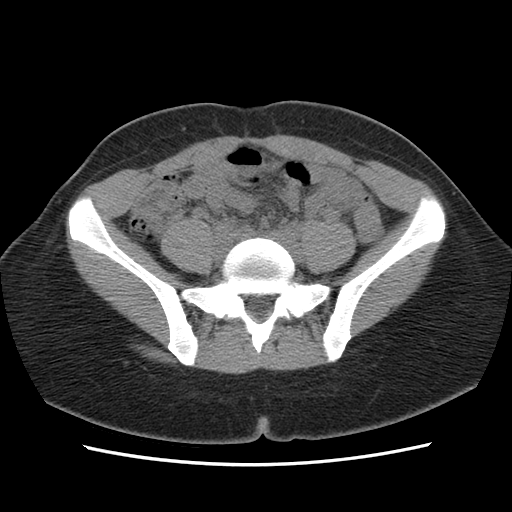
[im 30/63  soft-tissue]
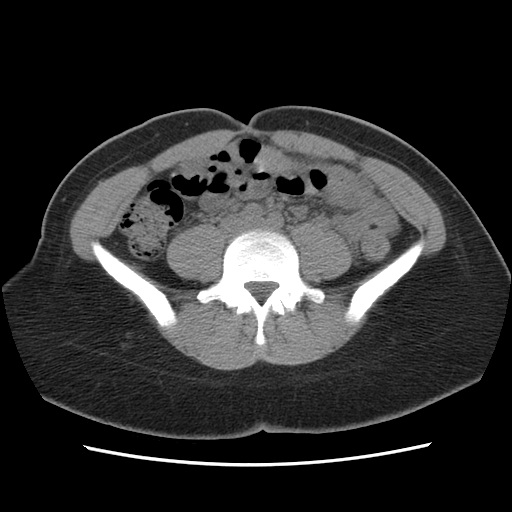
[im 33/63  soft-tissue]
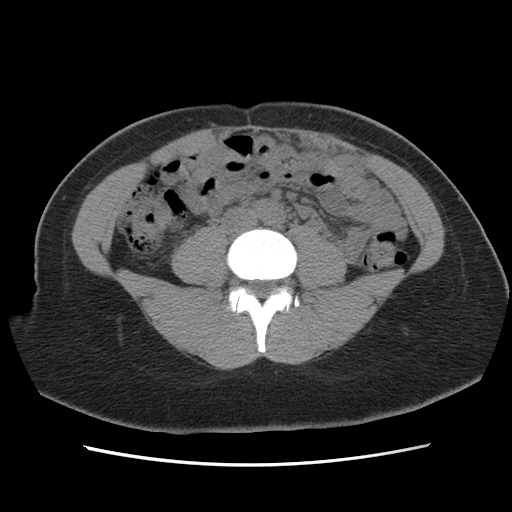
[im 38/63  soft-tissue]
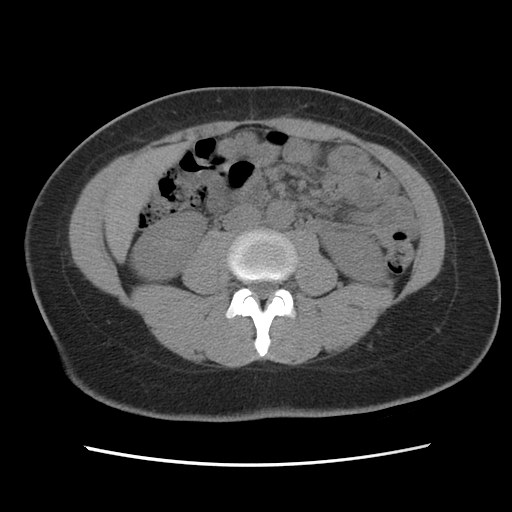
[im 38/63  bone]
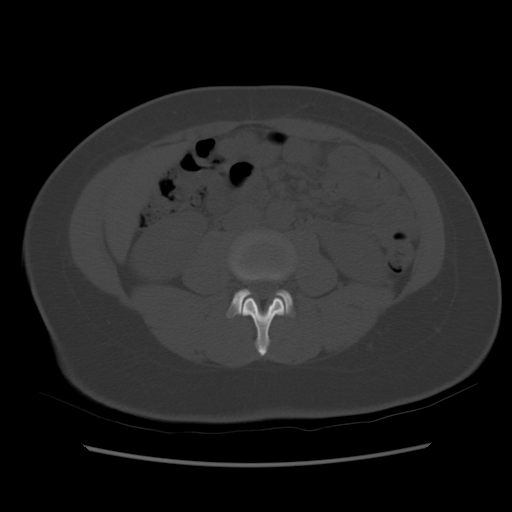
[im 43/63  soft-tissue]
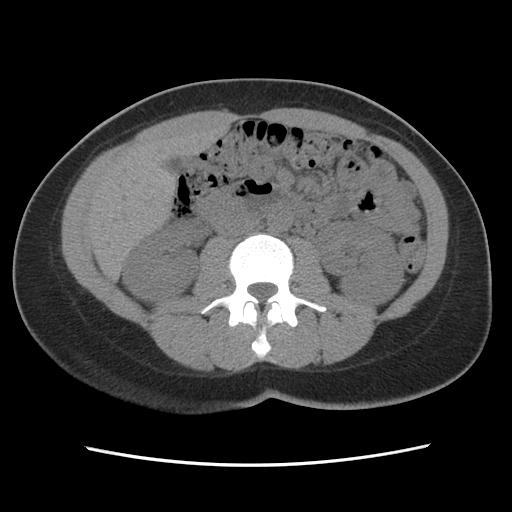
[im 48/63  soft-tissue]
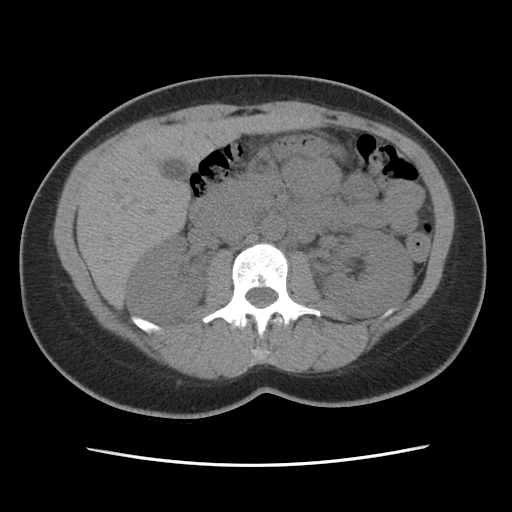
[im 50/63  soft-tissue]
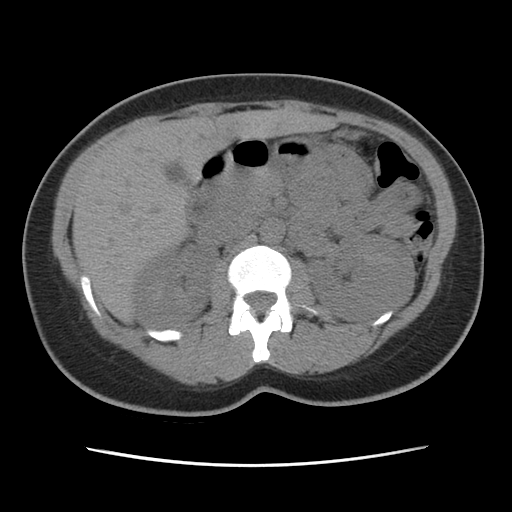
[im 55/63  soft-tissue]
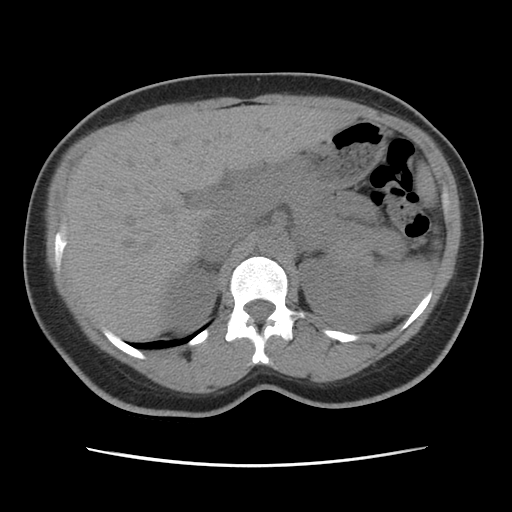
[im 60/63  soft-tissue]
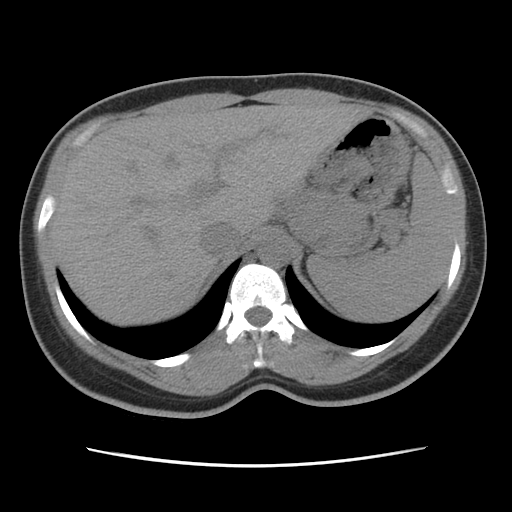

[Series 400: cor abd · coronal · 0.73mm/px · 3 of 78 slices shown]
[im 26/78  soft-tissue]
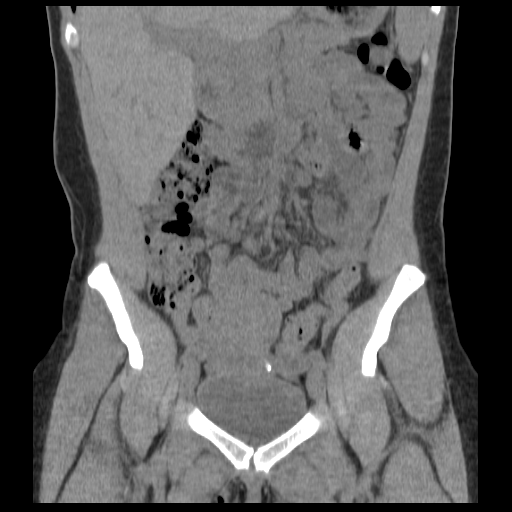
[im 35/78  soft-tissue]
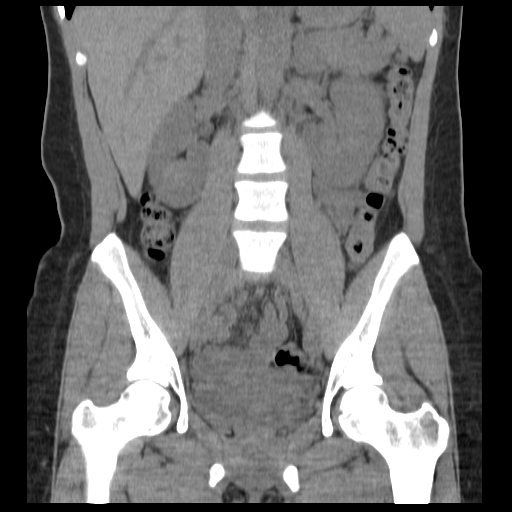
[im 43/78  soft-tissue]
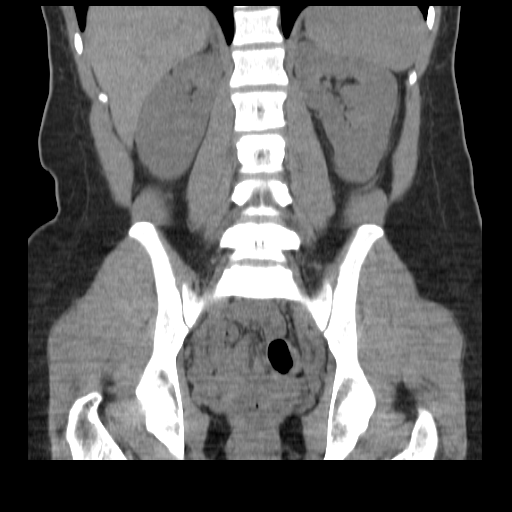

[17 of 46 positions shown; findings below may reference images not displayed]

FINDINGS: There is no evidence of renal calculi or hydronephrosis.
The other abdominal parenchymal organs are unremarkable appearance
on this noncontrast study.  Gallbladder is unremarkable appearance.

No soft tissue mass or inflammatory process identified.  There is
no evidence of dilated bowel loops.
IMPRESSION: Negative.  No evidence of renal calculi, hydronephrosis, or other
acute findings.

CT PELVIS
FINDINGS: There is no evidence of ureteral calculi or dilatation.
IUD is again seen in the uterus.  There is no evidence of pelvic
mass or inflammatory process.  No abnormal fluid collections are
seen.  There is no evidence of dilated bowel loops.
IMPRESSION: Negative.  No evidence of ureteral calculi or other acute findings.

## 2009-03-02 ENCOUNTER — Emergency Department (HOSPITAL_COMMUNITY): Admission: EM | Admit: 2009-03-02 | Discharge: 2009-03-02 | Payer: Self-pay | Admitting: Emergency Medicine

## 2009-04-04 ENCOUNTER — Emergency Department (HOSPITAL_COMMUNITY): Admission: EM | Admit: 2009-04-04 | Discharge: 2009-04-04 | Payer: Self-pay | Admitting: Emergency Medicine

## 2009-05-02 ENCOUNTER — Emergency Department (HOSPITAL_COMMUNITY): Admission: EM | Admit: 2009-05-02 | Discharge: 2009-05-02 | Payer: Self-pay | Admitting: Emergency Medicine

## 2009-05-28 ENCOUNTER — Emergency Department (HOSPITAL_COMMUNITY): Admission: EM | Admit: 2009-05-28 | Discharge: 2009-05-28 | Payer: Self-pay | Admitting: Emergency Medicine

## 2009-06-29 ENCOUNTER — Emergency Department (HOSPITAL_COMMUNITY): Admission: EM | Admit: 2009-06-29 | Discharge: 2009-06-29 | Payer: Self-pay | Admitting: Emergency Medicine

## 2009-06-29 IMAGING — US US TRANSVAGINAL NON-OB
1 series · 14 of 25 positions shown · non-contrast
Comparison: [DATE]

CLINICAL DATA: Left lower quadrant pain

TRANSVAGINAL ULTRASOUND OF PELVIS,ULTRASOUND PELVIS COMPLETE -
MODIFY
TECHNIQUE: Routine

[Series 1: us transvaginal non-ob · 0.17mm/px · 14 of 59 slices shown]
[im 1/59]
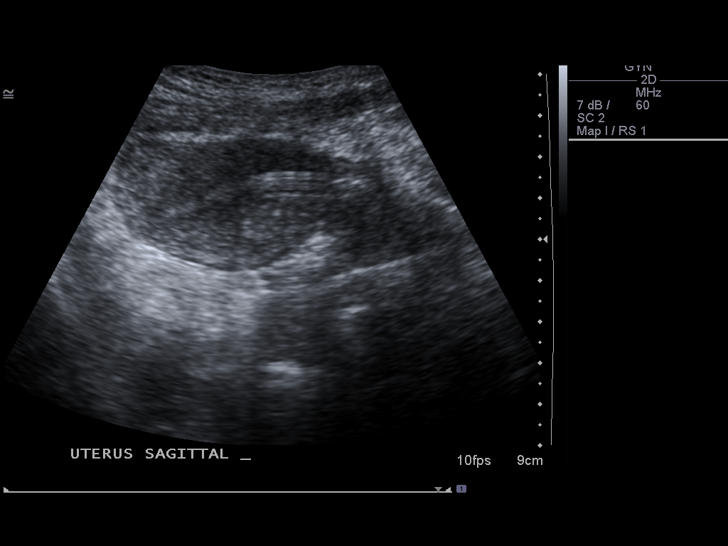
[im 5/59]
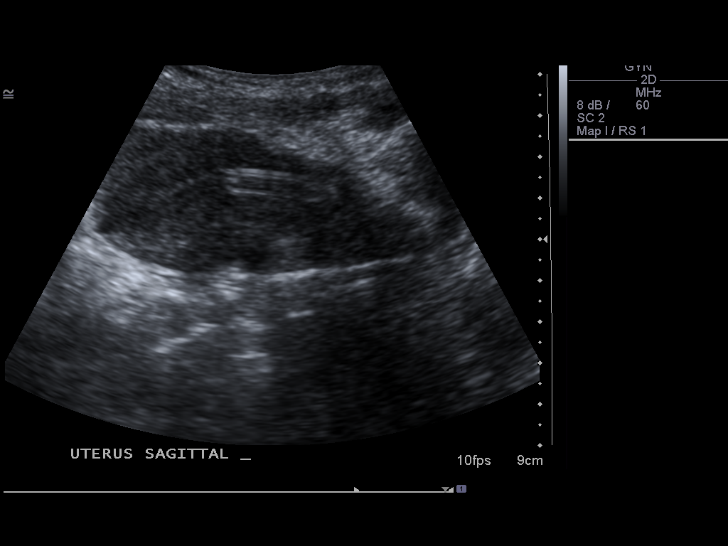
[im 10/59]
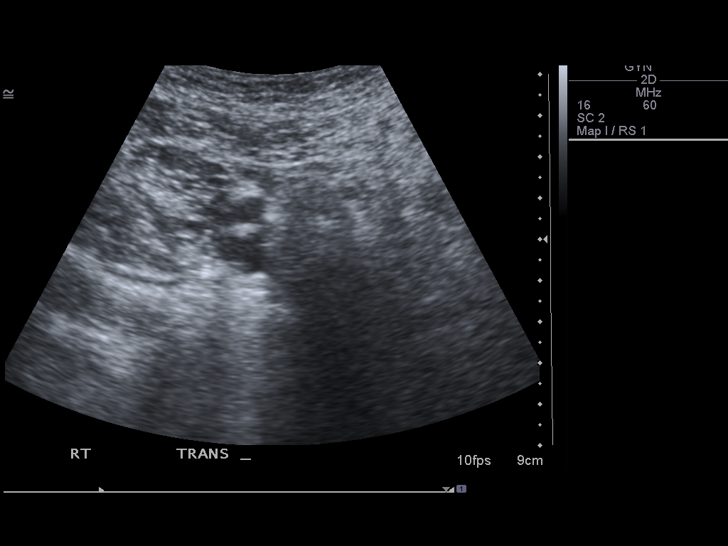
[im 15/59]
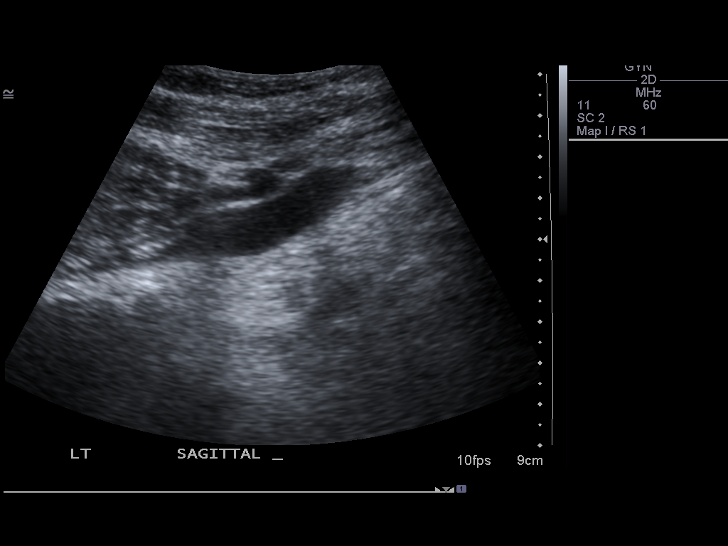
[im 20/59]
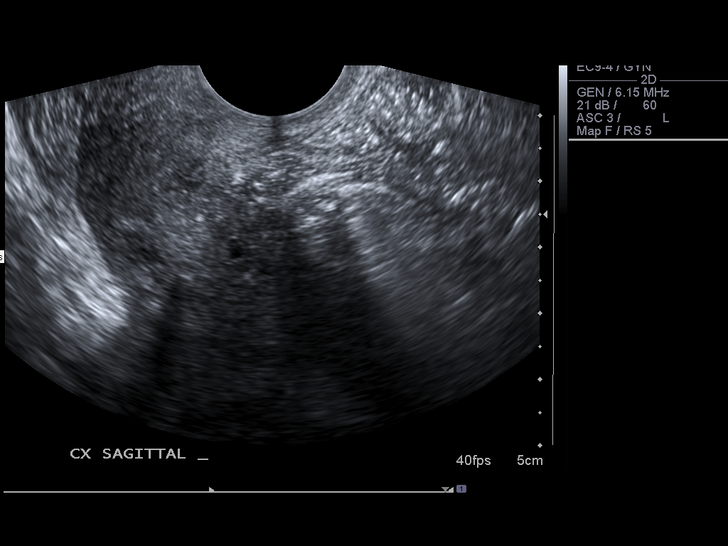
[im 22/59]
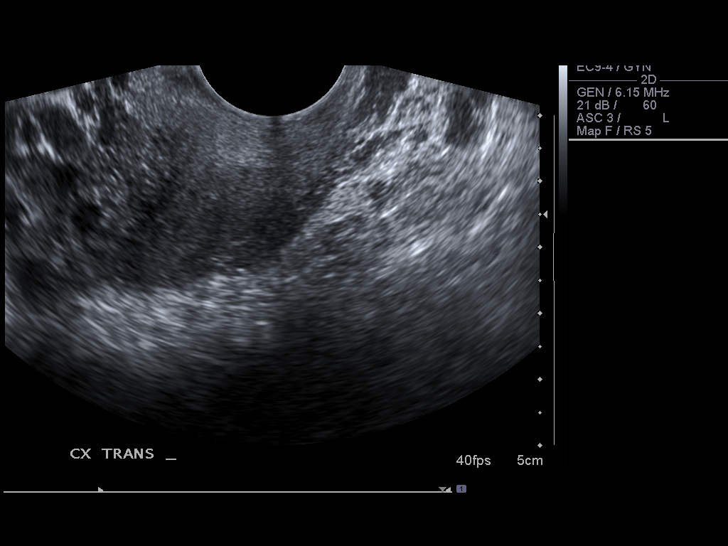
[im 27/59]
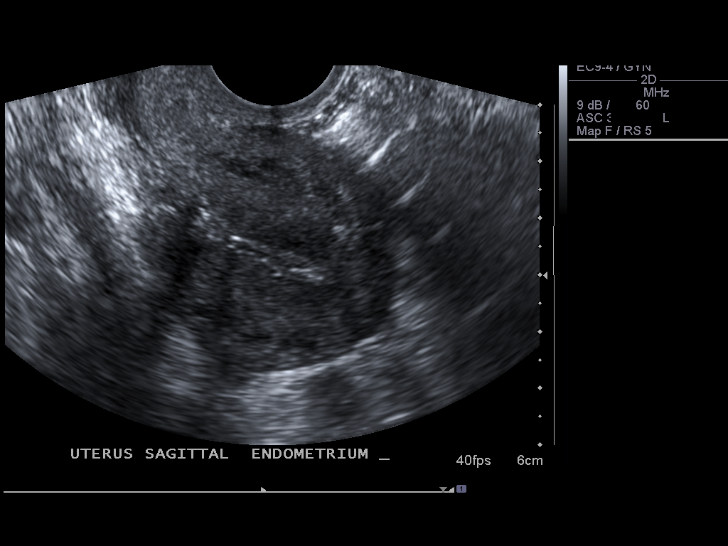
[im 32/59]
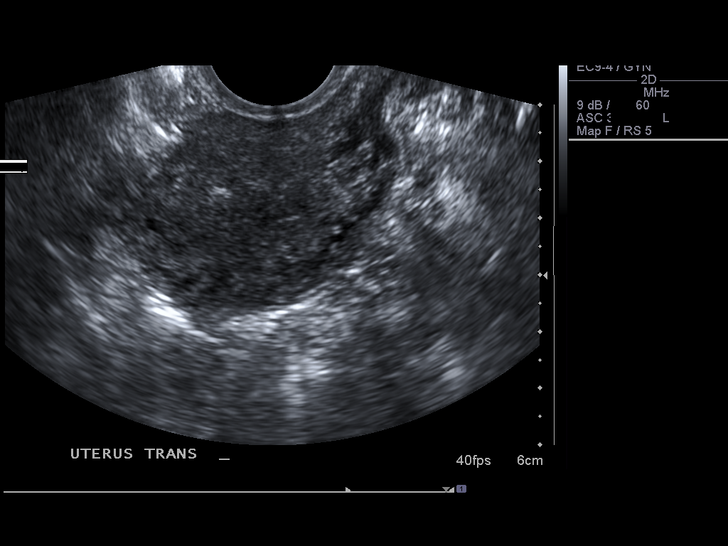
[im 37/59]
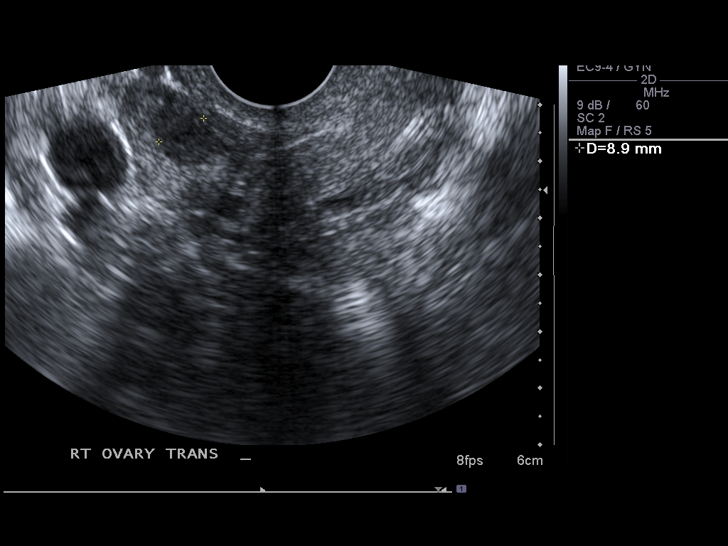
[im 39/59]
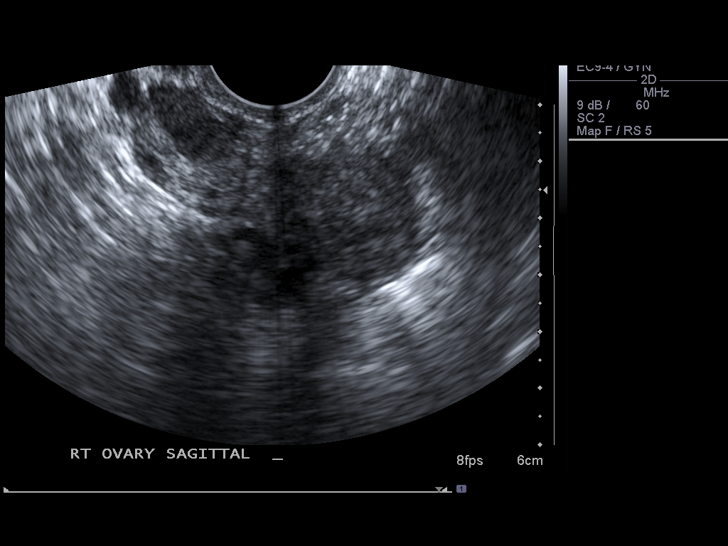
[im 44/59]
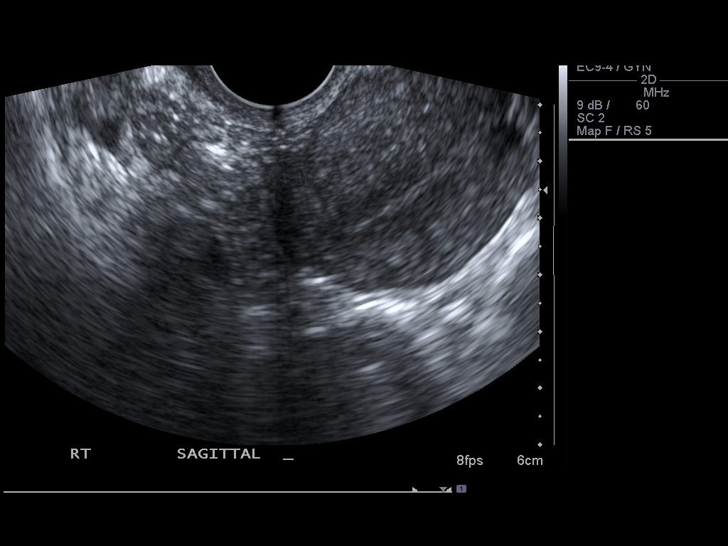
[im 49/59]
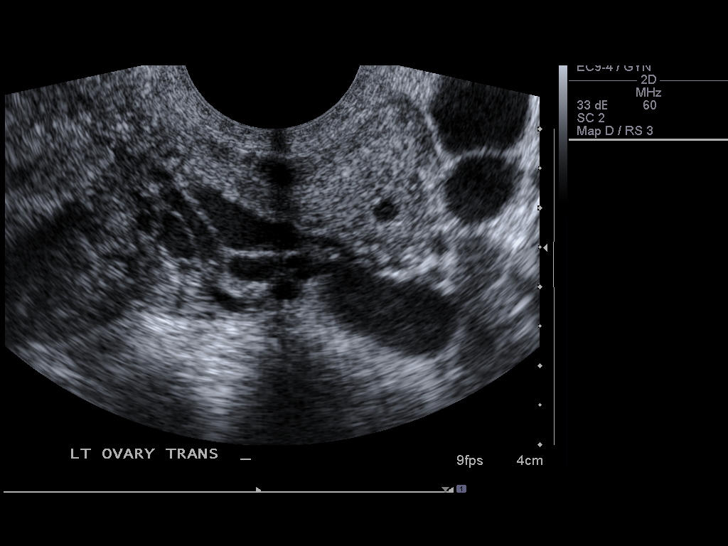
[im 54/59]
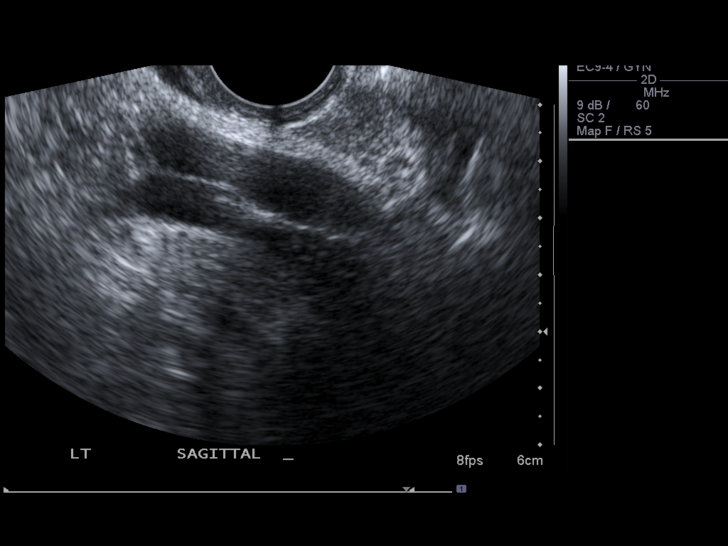
[im 59/59]
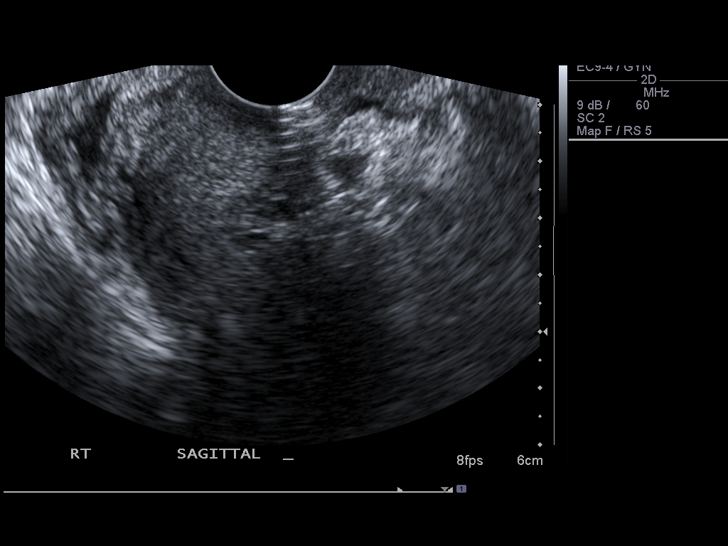

[14 of 25 positions shown; findings below may reference images not displayed]

FINDINGS: Uterine size and contour normal with measurements of
x 3.4 x 4.3 cm (length x AP x width).

No lesions of the myometrium.  Endometrium unremarkable measuring 5
mm in thickness.  An IUD is noted within the endometrial canal.

Right ovary 2.7 x 2.0 x 1.7 cm.  There is a small hypoechoic lesion
of the right ovary measuring 1.3 cm in greatest dimension.  This is
likely a hemorrhagic cyst.  It is unlikely to be clinically
significant.

Left ovary 2.7 x 1.1 x 1.6 cm.  No cysts or masses.

There is a small amount of fluid in the cul-de-sac and I would
regard this physiological.
IMPRESSION: 1.  Normal uterus and endometrium - there is an IUD in the
endometrial canal.
2.  No other acute or significant findings - there is a small
hypoechoic lesion of the right ovary.  See discussion.

## 2009-07-04 ENCOUNTER — Emergency Department (HOSPITAL_COMMUNITY): Admission: EM | Admit: 2009-07-04 | Discharge: 2009-07-04 | Payer: Self-pay | Admitting: Emergency Medicine

## 2009-10-09 ENCOUNTER — Emergency Department (HOSPITAL_COMMUNITY): Admission: EM | Admit: 2009-10-09 | Discharge: 2009-10-09 | Payer: Self-pay | Admitting: Emergency Medicine

## 2009-10-09 IMAGING — CT CT ABD-PELV W/O CM
2 of 4 series · 15 of 46 positions shown, 19 images · non-contrast
Comparison: [DATE]

CLINICAL DATA: Right flank pain

CT ABDOMEN AND PELVIS WITHOUT CONTRAST
TECHNIQUE: Multidetector CT imaging of the abdomen and pelvis was
performed following the standard protocol without intravenous
contrast.

[Series 2: stone <(id) <(id) · axial · 0.70mm/px · z∈[-383,-88]mm · 12 of 68 slices shown, 16 images]
[im 6/68  soft-tissue]
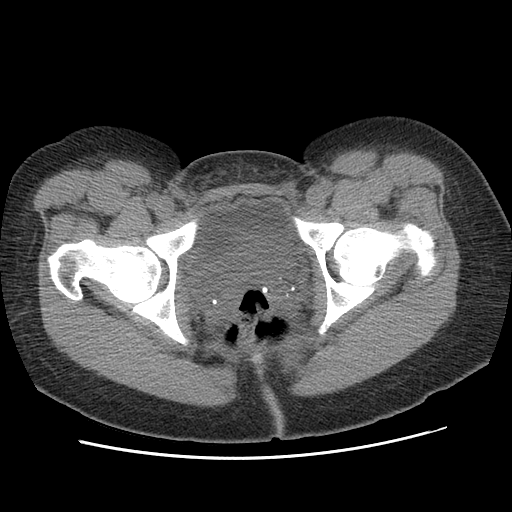
[im 6/68  bone]
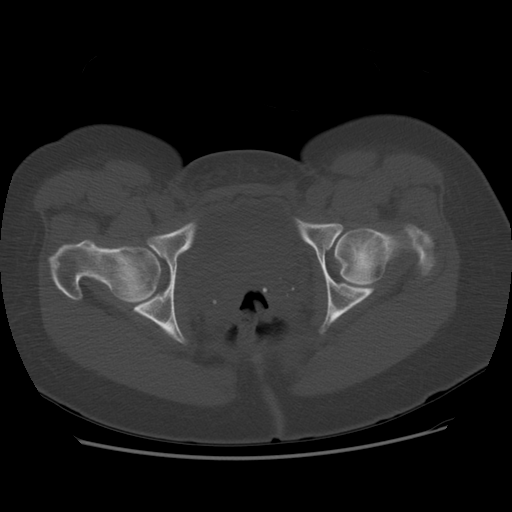
[im 11/68  soft-tissue]
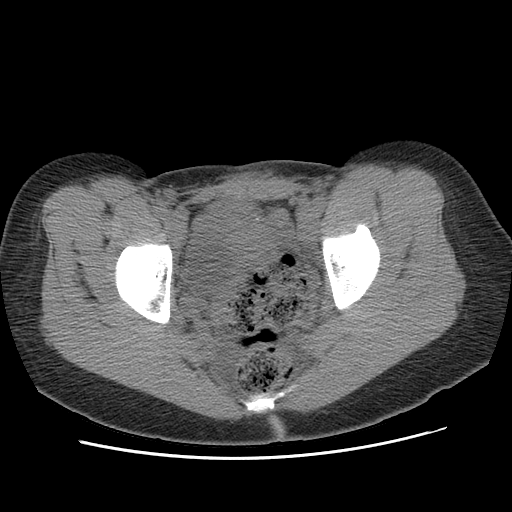
[im 19/68  soft-tissue]
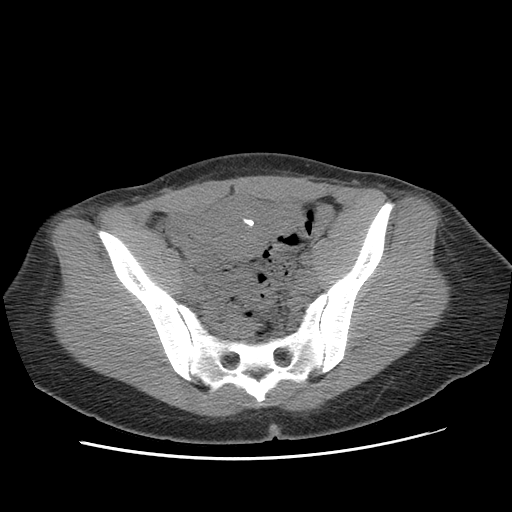
[im 24/68  soft-tissue]
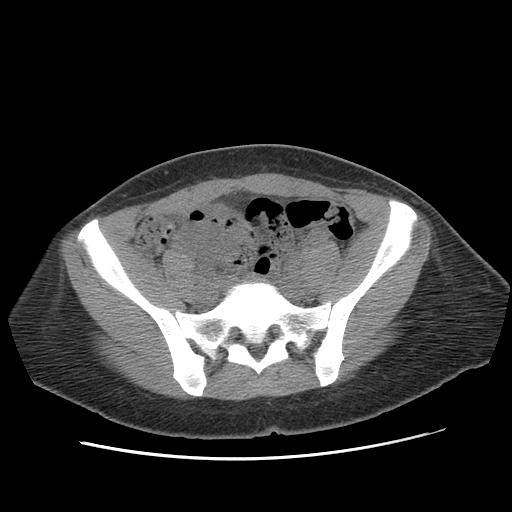
[im 31/68  soft-tissue]
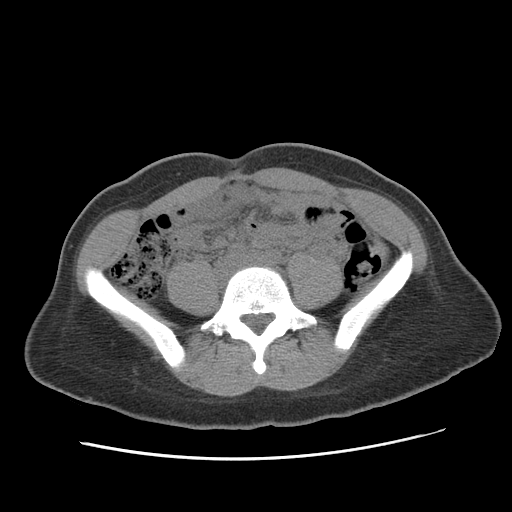
[im 37/68  soft-tissue]
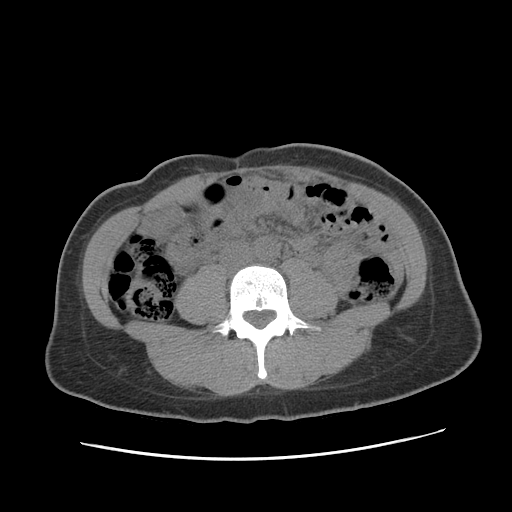
[im 44/68  soft-tissue]
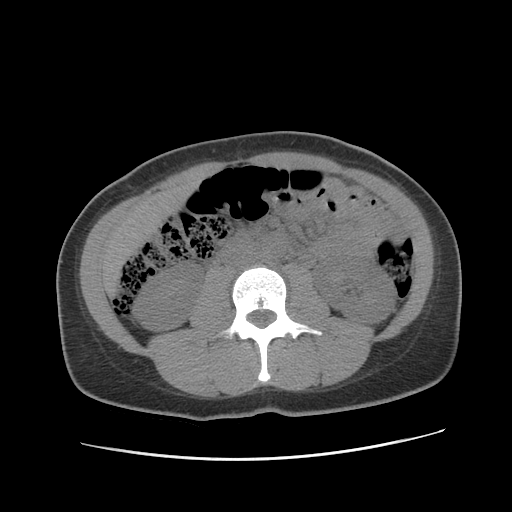
[im 49/68  soft-tissue]
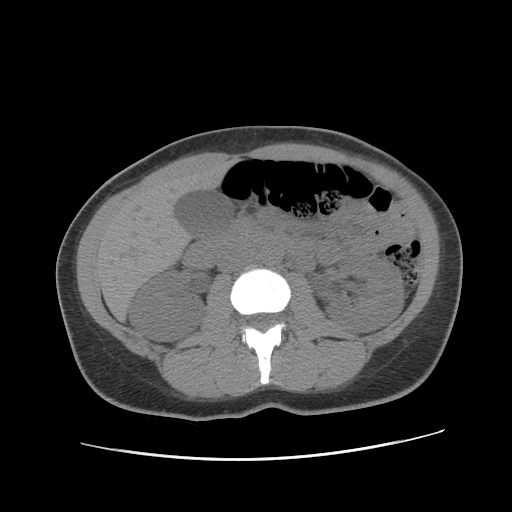
[im 57/68  soft-tissue]
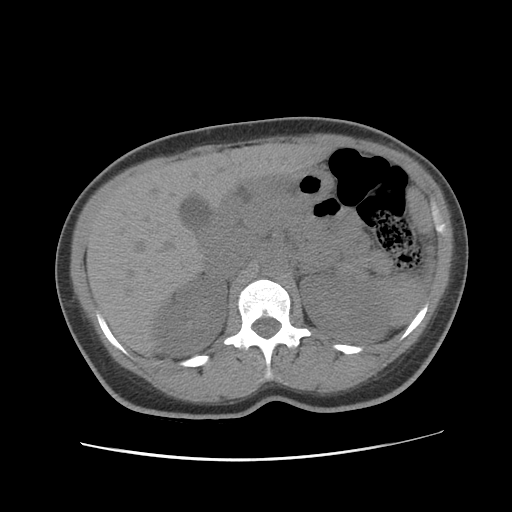
[im 57/68  lung]
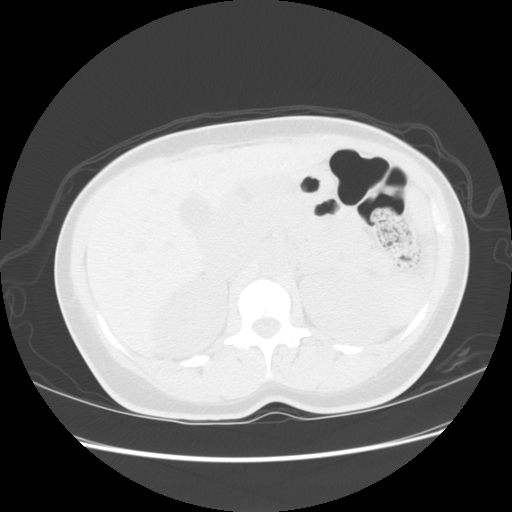
[im 57/68  bone]
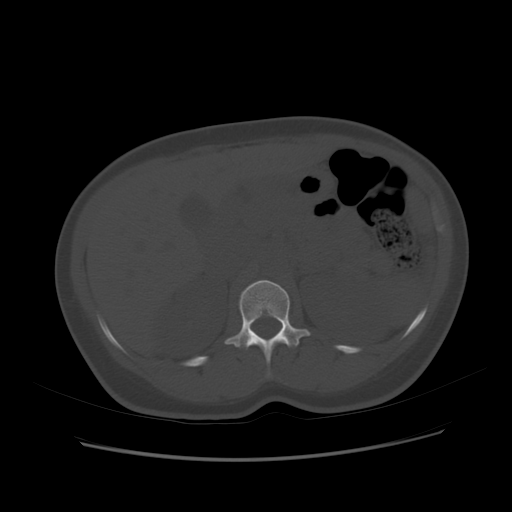
[im 60/68  lung]
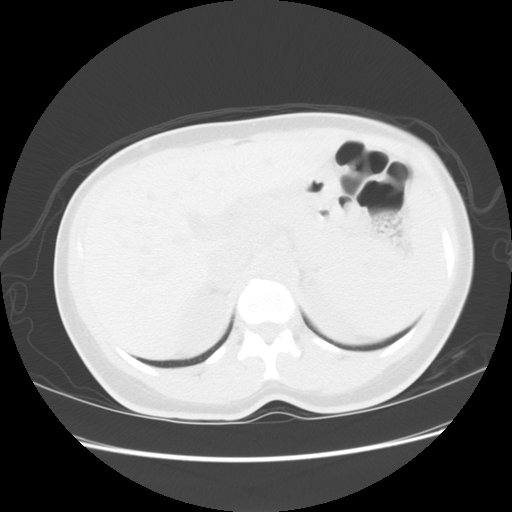
[im 62/68  soft-tissue]
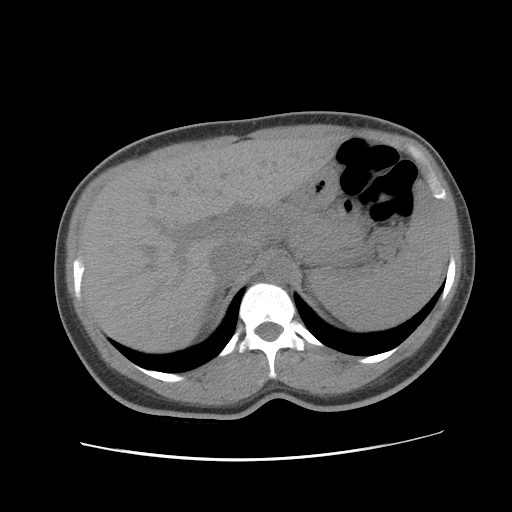
[im 62/68  lung]
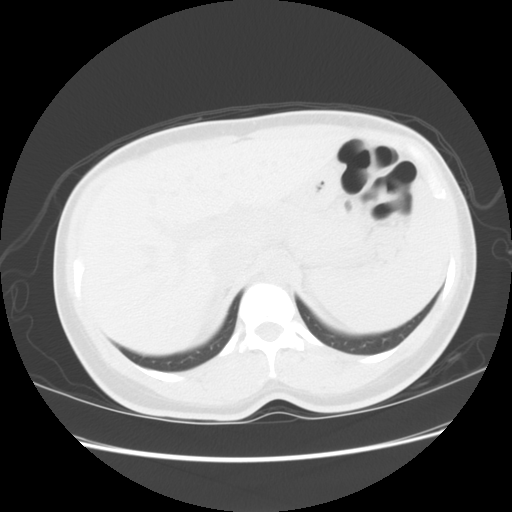
[im 65/68  lung]
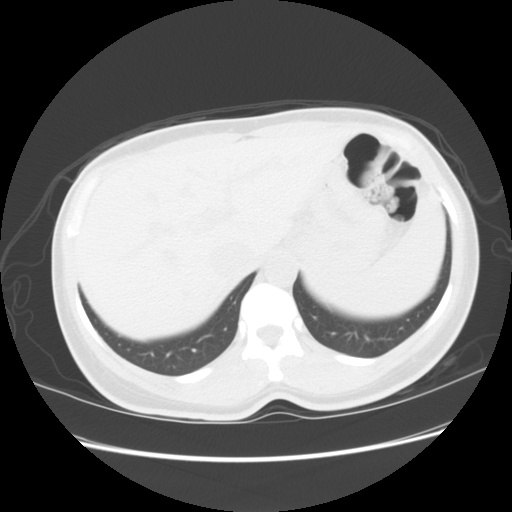

[Series 400: sag abd/pel · sagittal · 0.78mm/px · 3 of 93 slices shown]
[im 31/93  soft-tissue]
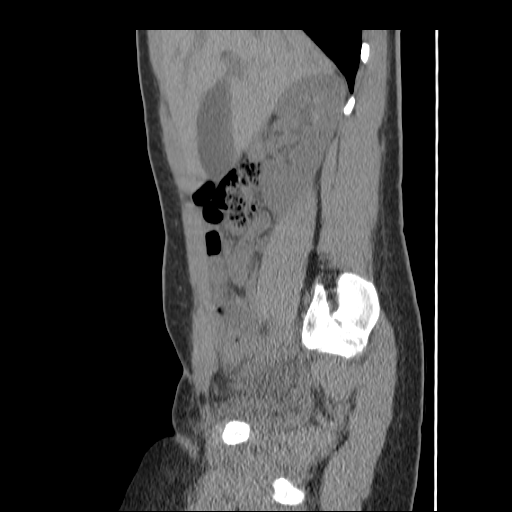
[im 41/93  soft-tissue]
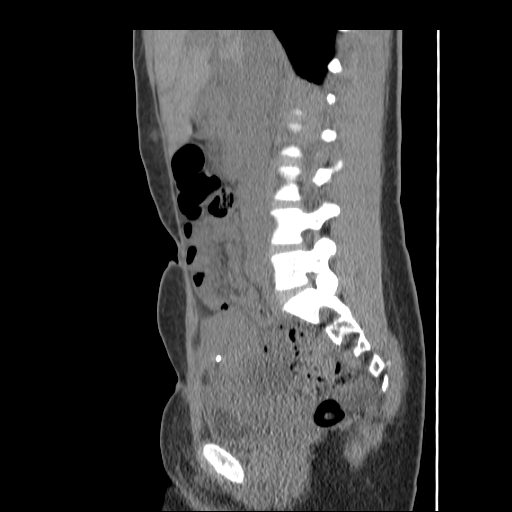
[im 52/93  soft-tissue]
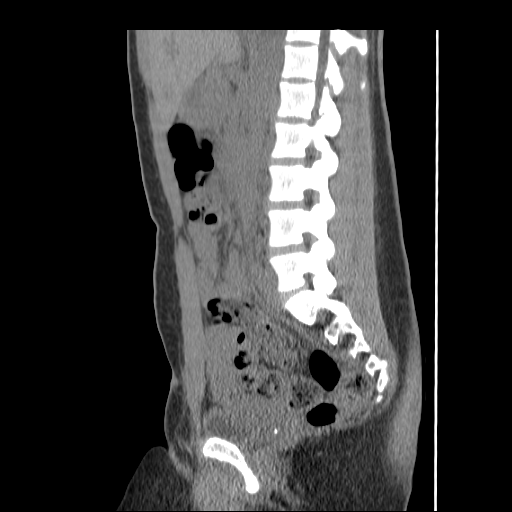

[15 of 46 positions shown; findings below may reference images not displayed]

FINDINGS: Visualized lung bases clear. Negative for nephrolithiasis
or hydronephrosis.  There is no calcification along the course of
the ureter.  Stable bilateral pelvic phleboliths.  Urinary bladder
incompletely distended, unremarkable.

Unremarkable uninfused evaluation of visualized portions of liver
and spleen, gallbladder, adrenal glands, pancreas.  Assessment
somewhat limited without IV contrast due to a paucity of intra-
abdominal fat.  There is minimal calcified plaque in the infrarenal
abdominal aorta.  Small bowel decompressed.  No free air.  No
ascites.

IUD projects in expected location.  4.3 cm right ovarian cyst has
developed since the prior scan.  There is a small amount of free
pelvic fluid.

Appendix not discretely identified.  The colon is nondilated,
unremarkable.
IMPRESSION: 1.  Negative for nephrolithiasis or hydronephrosis.
2.  4.3 cm right ovarian cyst.

## 2009-11-17 ENCOUNTER — Ambulatory Visit: Payer: Self-pay | Admitting: Physician Assistant

## 2009-11-17 DIAGNOSIS — N83209 Unspecified ovarian cyst, unspecified side: Secondary | ICD-10-CM | POA: Insufficient documentation

## 2009-11-17 DIAGNOSIS — I10 Essential (primary) hypertension: Secondary | ICD-10-CM | POA: Insufficient documentation

## 2009-11-17 LAB — CONVERTED CEMR LAB
ALT: 9 units/L (ref 0–35)
AST: 19 units/L (ref 0–37)
Albumin: 4.9 g/dL (ref 3.5–5.2)
Alkaline Phosphatase: 42 units/L (ref 39–117)
Amphetamine Screen, Ur: NEGATIVE
BUN: 14 mg/dL (ref 6–23)
Barbiturate Quant, Ur: NEGATIVE
Benzodiazepines.: NEGATIVE
CO2: 23 meq/L (ref 19–32)
Calcium: 9.5 mg/dL (ref 8.4–10.5)
Chloride: 104 meq/L (ref 96–112)
Cholesterol: 144 mg/dL (ref 0–200)
Cocaine Metabolites: NEGATIVE
Creatinine, Ser: 0.6 mg/dL (ref 0.40–1.20)
Creatinine,U: 164.2 mg/dL
Glucose, Bld: 83 mg/dL (ref 70–99)
HDL: 52 mg/dL (ref >39)
LDL Cholesterol: 83 mg/dL (ref 0–99)
Marijuana Metabolite: NEGATIVE
Methadone: NEGATIVE
Opiate Screen, Urine: POSITIVE — AB
Phencyclidine (PCP): NEGATIVE
Potassium: 4.4 meq/L (ref 3.5–5.3)
Propoxyphene: NEGATIVE
Sodium: 141 meq/L (ref 135–145)
TSH: 0.9 microintl units/mL (ref 0.350–4.500)
Total Bilirubin: 0.4 mg/dL (ref 0.3–1.2)
Total CHOL/HDL Ratio: 2.8
Total Protein: 7.4 g/dL (ref 6.0–8.3)
Triglycerides: 46 mg/dL (ref <150)
VLDL: 9 mg/dL (ref 0–40)

## 2009-11-18 ENCOUNTER — Encounter: Payer: Self-pay | Admitting: Physician Assistant

## 2009-12-19 ENCOUNTER — Emergency Department (HOSPITAL_COMMUNITY): Admission: EM | Admit: 2009-12-19 | Discharge: 2009-12-19 | Payer: Self-pay | Admitting: Emergency Medicine

## 2010-01-08 ENCOUNTER — Emergency Department (HOSPITAL_COMMUNITY): Admission: EM | Admit: 2010-01-08 | Discharge: 2010-01-08 | Payer: Self-pay | Admitting: Emergency Medicine

## 2010-01-08 IMAGING — CT CT ABD-PELV W/ CM
2 of 4 series · 15 of 42 positions shown, 19 images · IV contrast (water/omni  & 100 ML OMNI 300)
Comparison: [DATE]

CLINICAL DATA: Lower abdominal and pelvic pain.

CT ABDOMEN AND PELVIS WITH CONTRAST
TECHNIQUE: Multidetector CT imaging of the abdomen and pelvis was
performed following the standard protocol during bolus
administration of intravenous contrast.
Contrast: 100 ml [EE] and oral contrast

[Series 2: routine abdomen · axial · 0.67mm/px · z∈[-430,-70]mm · 12 of 83 slices shown, 16 images]
[im 7/83  soft-tissue]
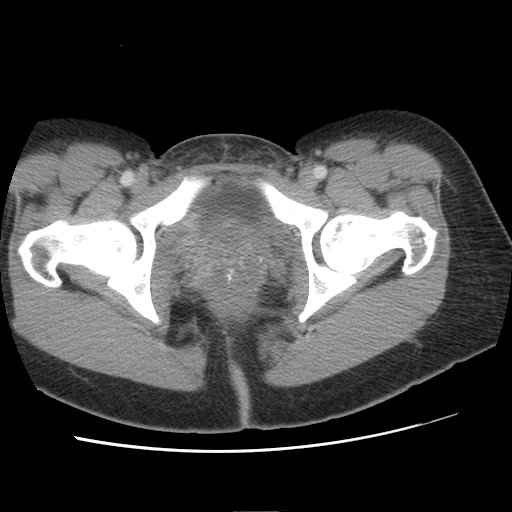
[im 7/83  bone]
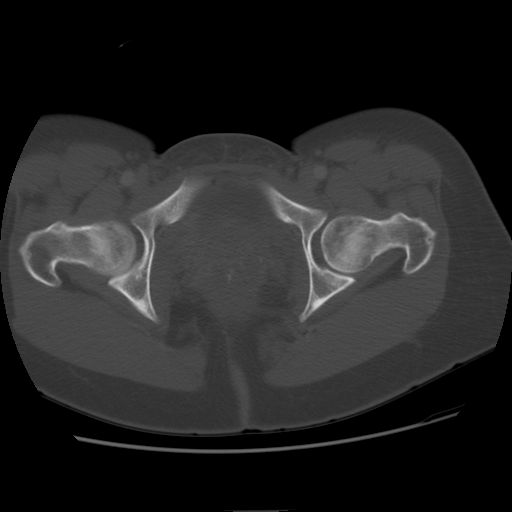
[im 14/83  soft-tissue]
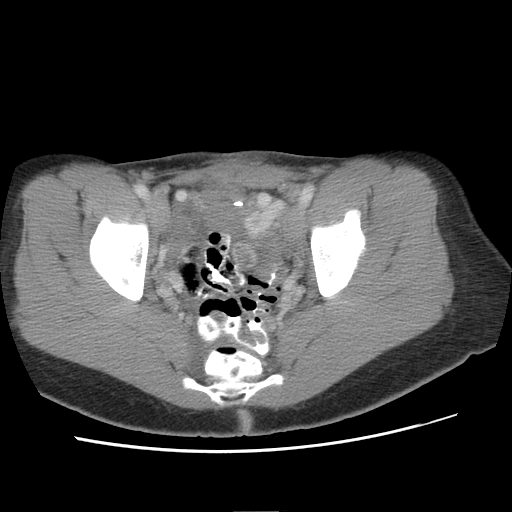
[im 21/83  soft-tissue]
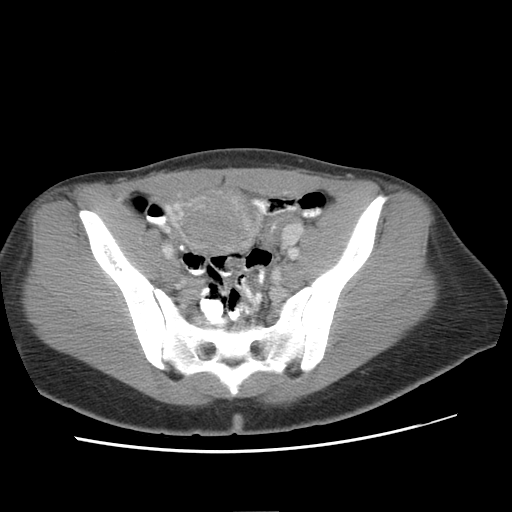
[im 31/83  soft-tissue]
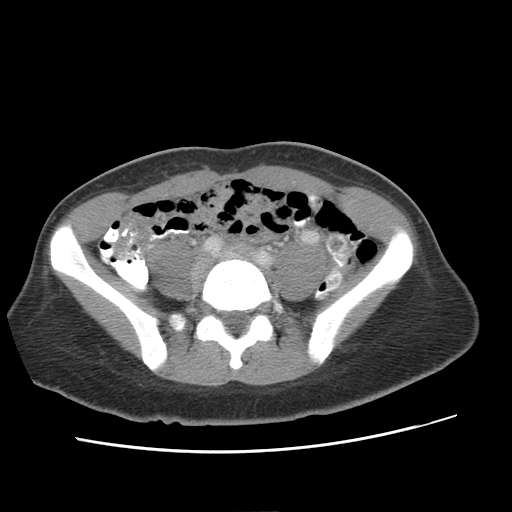
[im 38/83  soft-tissue]
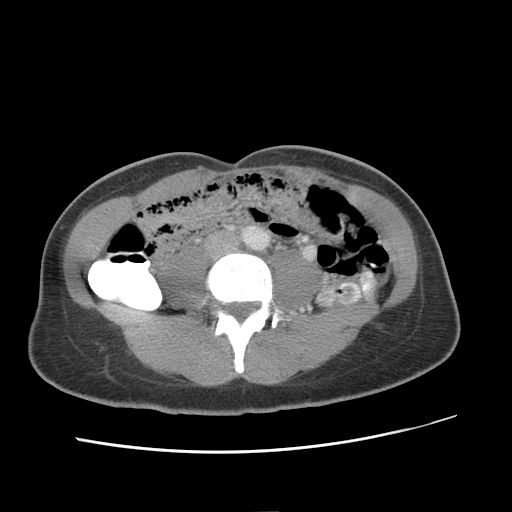
[im 45/83  soft-tissue]
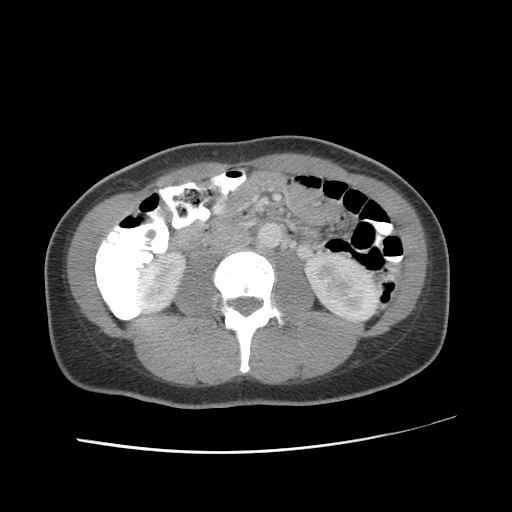
[im 52/83  soft-tissue]
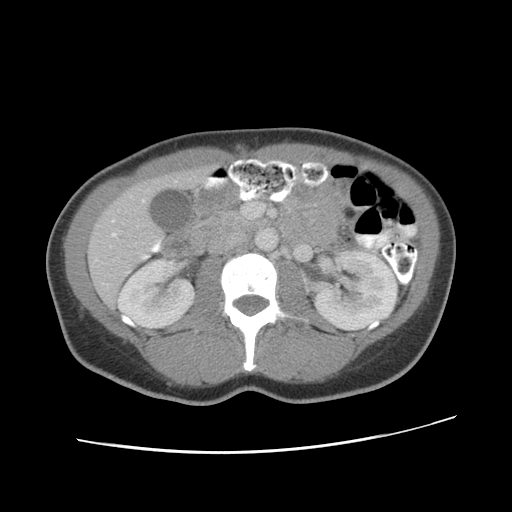
[im 62/83  soft-tissue]
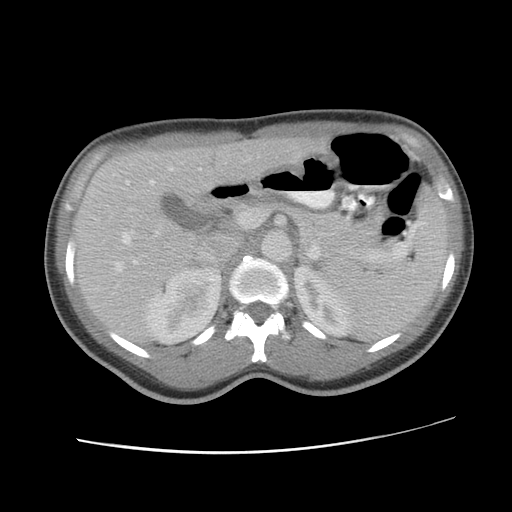
[im 69/83  soft-tissue]
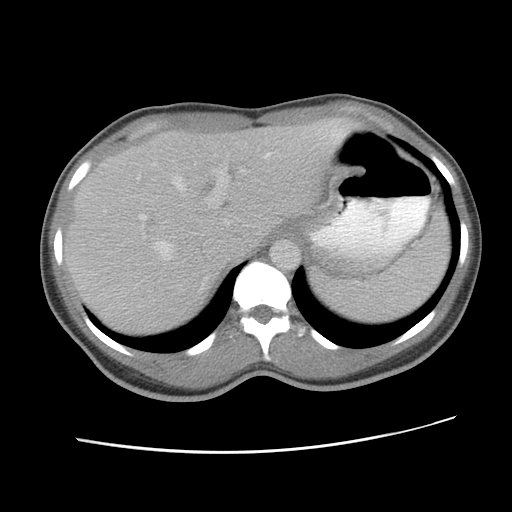
[im 69/83  lung]
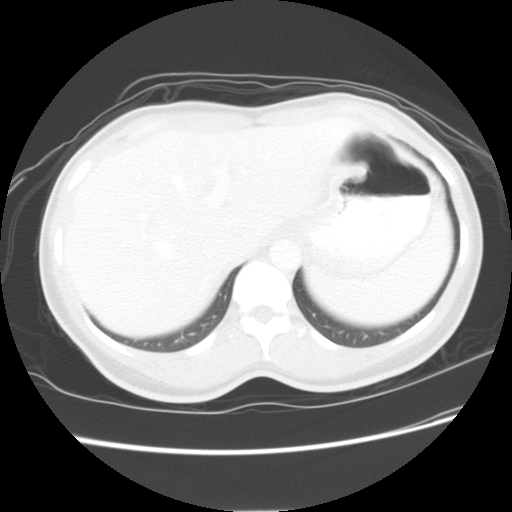
[im 69/83  bone]
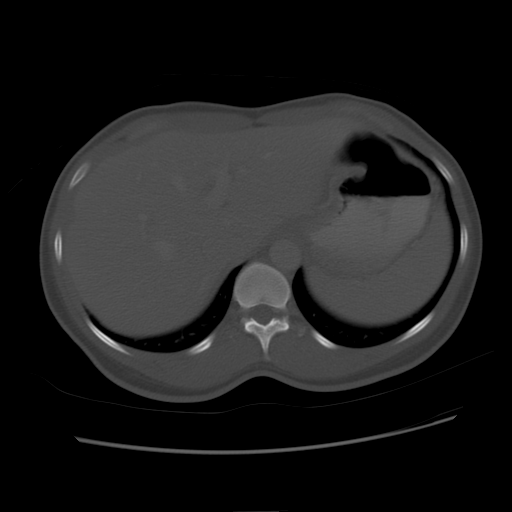
[im 72/83  lung]
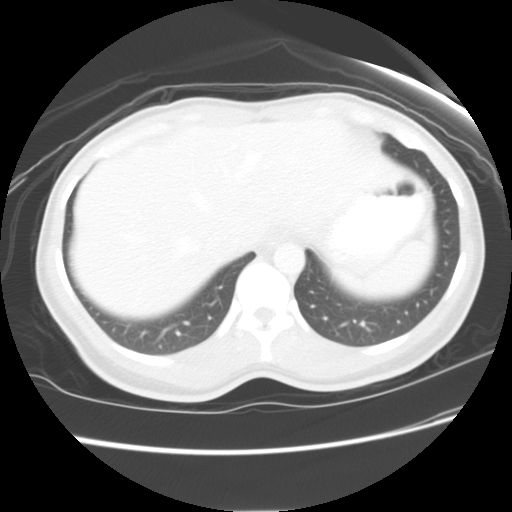
[im 76/83  soft-tissue]
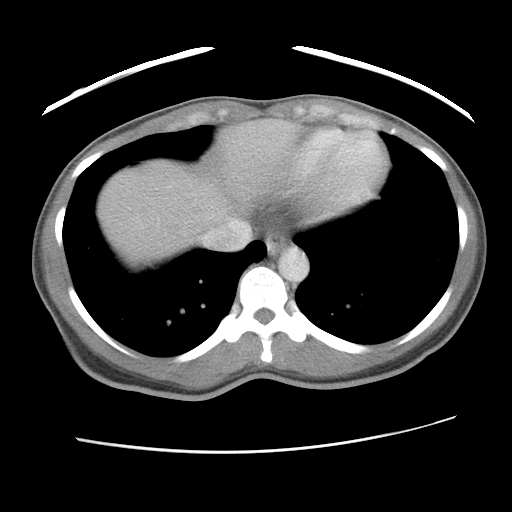
[im 76/83  lung]
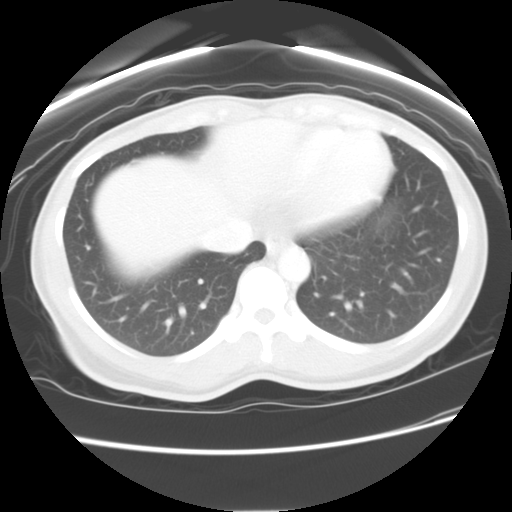
[im 79/83  lung]
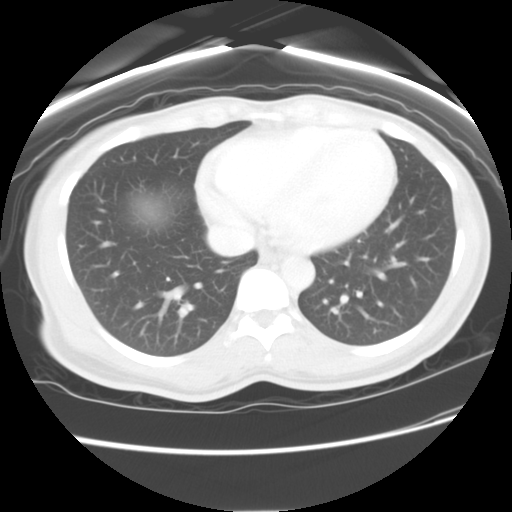

[Series 400: sag · sagittal · 0.89mm/px · 3 of 106 slices shown]
[im 22/106  soft-tissue]
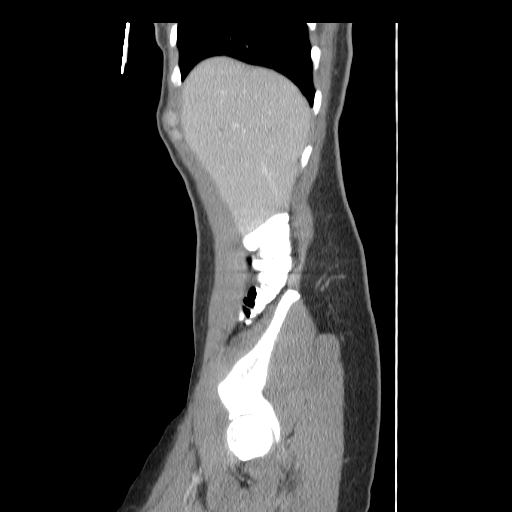
[im 43/106  soft-tissue]
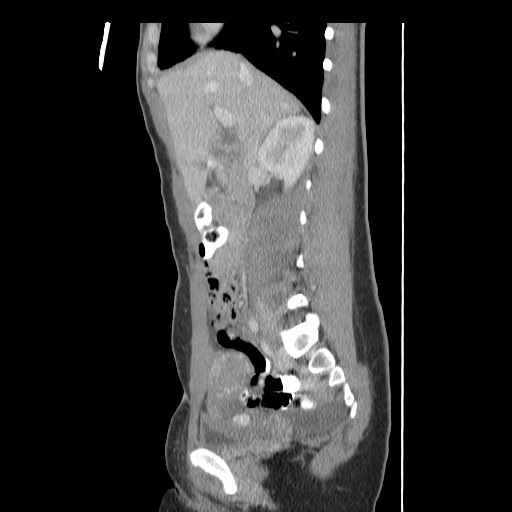
[im 64/106  soft-tissue]
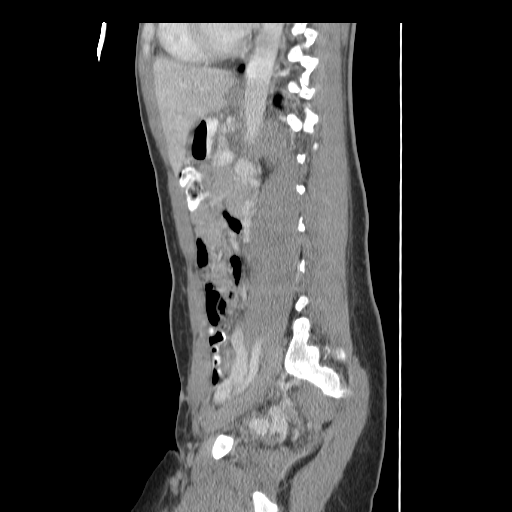

[15 of 42 positions shown; findings below may reference images not displayed]

FINDINGS: The abdominal parenchymal organs are normal appearance.
Gallbladder is unremarkable there is no evidence of hydronephrosis.

No soft tissue masses are identified within the abdomen or pelvis.
A small amount of free fluid is seen in the pelvic cul-de-sac.  No
inflammatory process identified.

An IUD is seen which appears to be an abnormally low position, with
the right transverse arm of the IUD penetrating the myometrial wall
in the uterine body.
IMPRESSION: 1.  Abnormal low IUD position, with penetration into the wall of
the uterine body.  Small amount of free fluid and pelvic cul-de-
sac.
2.  No other significant abnormality identified.

## 2010-03-31 ENCOUNTER — Encounter: Payer: Self-pay | Admitting: Physician Assistant

## 2010-04-20 ENCOUNTER — Encounter: Payer: Self-pay | Admitting: Physician Assistant

## 2010-04-21 ENCOUNTER — Encounter: Payer: Self-pay | Admitting: Physician Assistant

## 2010-04-21 DIAGNOSIS — R519 Headache, unspecified: Secondary | ICD-10-CM | POA: Insufficient documentation

## 2010-04-21 DIAGNOSIS — R51 Headache: Secondary | ICD-10-CM | POA: Insufficient documentation

## 2010-07-23 ENCOUNTER — Emergency Department (HOSPITAL_COMMUNITY): Admission: EM | Admit: 2010-07-23 | Discharge: 2010-07-23 | Payer: Self-pay | Admitting: Emergency Medicine

## 2010-10-12 NOTE — Letter (Signed)
Summary: REFERRAL//GYNECOLOGIC/[facility] DATE & TIME  REFERRAL//GYNECOLOGIC/[facility] DATE & TIME   Imported By: Arta Bruce 02/09/2010 15:25:46  _____________________________________________________________________  External Attachment:    Type:   Image     Comment:   External Document

## 2010-10-12 NOTE — Letter (Signed)
Summary: FAXED REQUESTED RECORDS TO [facility]  FAXED REQUESTED RECORDS TO [facility]   Imported By: Arta Bruce 04/20/2010 11:39:17  _____________________________________________________________________  External Attachment:    Type:   Image     Comment:   External Document

## 2010-10-12 NOTE — Letter (Signed)
Summary: [facility]  [facility]   Imported By: Arta Bruce 04/21/2010 15:11:56  _____________________________________________________________________  External Attachment:    Type:   Image     Comment:   External Document

## 2010-10-12 NOTE — Letter (Signed)
Summary: PT INFORMATION SHEET  PT INFORMATION SHEET   Imported By: Arta Bruce 01/17/2010 15:54:57  _____________________________________________________________________  External Attachment:    Type:   Image     Comment:   External Document

## 2010-10-12 NOTE — Assessment & Plan Note (Signed)
Summary: NP-MEDICAID,CYST/OVARY,FROM WAIT LIST//DS   Vital Signs:  Patient profile:   35 year old female Height:      65 inches Weight:      137 pounds BMI:     22.88 Temp:     98.7 degrees F oral Pulse rate:   82 / minute Pulse rhythm:   regular Resp:     18 per minute BP sitting:   128 / 86  (left arm) Cuff size:   regular  Vitals Entered By: [place] Shannon (November 17, 2009 9:40 AM) CC: NP... pt is here for ovarian cyst...Marland KitchenMarland Kitchen pt needs referral gyn... Pain Assessment Patient in pain? yes     Location: right side Intensity: 6 Type: stabbing/sching Onset of pain  Intermittent  Does patient need assistance? Functional Status Self care Ambulation Normal   CC:  NP... pt is here for ovarian cyst...Marland KitchenMarland Kitchen pt needs referral gyn....  History of Present Illness: New patient.  Ovarian cyst:  IUD put in in 2008.  Has had 3 ovarian cysts since.  One on the left and 2 on the right.  Recent CT scan noted below.  She notes spotting every day and also has normal cycle in addn.  She has a cycle every 28 days.  She has right pelvic pain.  Now present about 2 weeks.  Seems to be getting worse.  She is taking tylenol.  No n/v/d.  No fevers.  Other cysts resolved.  No episodes of rupture.    Habits & Providers  Alcohol-Tobacco-Diet     Alcohol drinks/day: <1     Tobacco Status: current     Cigarette Packs/Day: <0.25     Year Started: 2000  Exercise-Depression-Behavior     Does Patient Exercise: yes     Type of exercise: walk     Have you felt down or hopeless? no     STD Risk: never     Drug Use: no     Seat Belt Use: always     Sun Exposure: infrequent  Current Medications (verified): 1)  None  Allergies (verified): 1)  ! Asa 2)  ! Ibuprofen  Past History:  Past Medical History: Hypertension   a.  occurred after preg   b.  on aldomet for a year; changed diet; off med for about 7-8 mos and no probs since  Past Surgical History: Caesarean section x 4  Family  History: Cancer: Father intestinal (? colon) age 64 MGM and MGF - unknown types (? 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    Physical Exam  General:  alert, well-developed, and well-nourished.   Head:  normocephalic and atraumatic.   Neck:  supple.   Lungs:  normal breath sounds, no crackles, and no wheezes.   Heart:  normal rate and regular rhythm.   Abdomen:  soft, normal bowel sounds, and no hepatomegaly.   right pelvic pain noted with palp Msk:  normal ROM.   Extremities:  no edema  Neurologic:  alert & oriented X3 and cranial nerves II-XII intact.   Psych:  normally interactive.  Impression & Recommendations:  Problem # 1:  OVARIAN CYST (ICD-620.2)  referral to [facility] had to be canceled due to her Medicaid has IUD and wants removed will have to send to GYN for eval referral made  Orders: Gynecologic Referral (Gyn)  Problem # 2:  PREVENTIVE HEALTH CARE (ICD-V70.0) advised her to call and schedule pap if not done with gyn refuses flu shot today refuses Td shot today advised her to quit smoking  Orders: T-Drug Screen-Urine, (single) [phone]) T-HIV Antibody  (Reflex) [phone]) T-Syphilis Test (RPR) [phone]) T-Lipid Profile [phone])  Other Orders: T-Comprehensive Metabolic Panel ([id number]) T-TSH ([id number])  Patient Instructions: 1)  Tobacco is very bad for your health and your loved ones ! You should stop smoking !  2)  Stop smoking tips:  Choose a quit date. Cut down before the quit date. Decide what you will do as a substitute when you feel the urge to smoke(gum, toothpick, exercise).  3)  We will schedule you with the gynecologist. 4)  Take 650 - 1000 mg of tylenol every 4-6 hours as needed for relief of pain or comfort of fever. Avoid taking more than 4000 mg in a 24 hour period( can cause liver damage in higher doses).  5)  Take 400-600 mg of Ibuprofen (Advil, Motrin) with food every 4-6 hours as needed  for relief of pain or comfort of fever.  6)  Schedule a CPP with Mindy Gali if the gynecologist does not perform one.   CT Abdomen/Pelvis  Procedure date:  10/09/2009  Findings:      IMPRESSION:    1.  Negative for nephrolithiasis or hydronephrosis.   2.  4.3 cm right ovarian cyst.

## 2010-10-12 NOTE — Miscellaneous (Signed)
Summary: migraines . . Shirley [name] at [facility] 8.2011  Clinical Lists Changes  Problems: Added new problem of HEADACHE (ICD-784.0) Medications: Added new medication of TOPAMAX 25 MG TABS (TOPIRAMATE) Take 1 tablet by mouth once a day Added new medication of ZOMIG 5 MG TABS (ZOLMITRIPTAN) take one at onset of migraine Observations: Added new observation of CHIEF CMPLNT: Headache (04/21/2010 5:54) Added new observation of PAST MED HX: Hypertension   a.  occurred after preg   b.  on aldomet for a year; changed diet; off med for about 7-8 mos and no probs since Headache   a.  migraines; eval by Dr. Vickey Huger 8.2011 (04/21/2010 5:54) Added new observation of PMH HEADACHE: yes (04/21/2010 5:54)       Past History:  Past Medical History: Hypertension   a.  occurred after preg   b.  on aldomet for a year; changed diet; off med for about 7-8 mos and no probs since Headache   a.  migraines; eval by Dr. Vickey Huger 8.2011   Impression & Recommendations:  Problem # 1:  HEADACHE (ICD-784.0)  eval by Dr. Vickey Huger 8.2011 meds started  Her updated medication list for this problem includes:    Zomig 5 Mg Tabs (Zolmitriptan) .Marland Kitchen... Take one at onset of migraine  Complete Medication List: 1)  Topamax 25 Mg Tabs (Topiramate) .... Take 1 tablet by mouth once a day 2)  Zomig 5 Mg Tabs (Zolmitriptan) .... Take one at onset of migraine

## 2010-10-12 NOTE — Letter (Signed)
Summary: *HSN Results Follow up  [facility]  [address] [place], [place] [address]   Phone: [phone]  Fax: [phone]      11/18/2009   [name] [address] [place], [place]  [address]   Dear  [name],                            ____S.Drinkard,FNP   ____D. Gore,FNP       ____B. McPherson,MD   ____V. Rankins,MD    ____E. Mulberry,MD    ____N. Daphine Deutscher, FNP  ____D. Reche Dixon, MD    ____K. Philipp Deputy, MD    __x__S. Alben Spittle, PA-C     This letter is to inform you that your recent test(s):  _______Pap Smear    ___x___Lab Test     _______X-ray    ___x____ is within acceptable limits  _______ requires a medication change  _______ requires a follow-up lab visit  _______ requires a follow-up visit with your provider   Comments:       _________________________________________________________ If you have any questions, please contact our office                     Sincerely,  Shirley Newcomer PA-C [facility]

## 2010-11-26 LAB — CBC
HCT: 36.3 % (ref 36.0–46.0)
Hemoglobin: 12.5 g/dL (ref 12.0–15.0)
MCHC: 34.4 g/dL (ref 30.0–36.0)
MCV: 97.4 fL (ref 78.0–100.0)
Platelets: 189 10*3/uL (ref 150–400)
RBC: 3.72 MIL/uL — ABNORMAL LOW (ref 3.87–5.11)
RDW: 12.9 % (ref 11.5–15.5)
WBC: 5.9 10*3/uL (ref 4.0–10.5)

## 2010-11-26 LAB — DIFFERENTIAL
Basophils Absolute: 0 10*3/uL (ref 0.0–0.1)
Basophils Relative: 0 % (ref 0–1)
Eosinophils Absolute: 0.2 10*3/uL (ref 0.0–0.7)
Eosinophils Relative: 3 % (ref 0–5)
Lymphocytes Relative: 27 % (ref 12–46)
Lymphs Abs: 1.6 10*3/uL (ref 0.7–4.0)
Monocytes Absolute: 0.2 10*3/uL (ref 0.1–1.0)
Monocytes Relative: 4 % (ref 3–12)
Neutro Abs: 3.9 10*3/uL (ref 1.7–7.7)
Neutrophils Relative %: 66 % (ref 43–77)

## 2010-11-26 LAB — COMPREHENSIVE METABOLIC PANEL
ALT: 10 U/L (ref 0–35)
AST: 15 U/L (ref 0–37)
Albumin: 4.1 g/dL (ref 3.5–5.2)
Alkaline Phosphatase: 38 U/L — ABNORMAL LOW (ref 39–117)
BUN: 7 mg/dL (ref 6–23)
CO2: 26 mEq/L (ref 19–32)
Calcium: 8.7 mg/dL (ref 8.4–10.5)
Chloride: 103 mEq/L (ref 96–112)
Creatinine, Ser: 0.51 mg/dL (ref 0.4–1.2)
GFR calc Af Amer: >60 mL/min (ref >60)
GFR calc non Af Amer: >60 mL/min (ref >60)
Glucose, Bld: 96 mg/dL (ref 70–99)
Potassium: 3.3 mEq/L — ABNORMAL LOW (ref 3.5–5.1)
Sodium: 136 mEq/L (ref 135–145)
Total Bilirubin: 0.5 mg/dL (ref 0.3–1.2)
Total Protein: 6.5 g/dL (ref 6.0–8.3)

## 2010-11-26 LAB — URINALYSIS, ROUTINE W REFLEX MICROSCOPIC
Bilirubin Urine: NEGATIVE
Glucose, UA: NEGATIVE mg/dL
Ketones, ur: NEGATIVE mg/dL
Leukocytes, UA: NEGATIVE
Nitrite: NEGATIVE
Protein, ur: NEGATIVE mg/dL
Specific Gravity, Urine: 1.015 (ref 1.005–1.030)
Urobilinogen, UA: 0.2 mg/dL (ref 0.0–1.0)
pH: 6.5 (ref 5.0–8.0)

## 2010-11-26 LAB — URINE MICROSCOPIC-ADD ON

## 2010-11-26 LAB — PREGNANCY, URINE: Preg Test, Ur: NEGATIVE

## 2010-11-26 LAB — LIPASE, BLOOD: Lipase: 14 U/L (ref 11–59)

## 2010-11-28 LAB — WET PREP, GENITAL
Trich, Wet Prep: NONE SEEN
Yeast Wet Prep HPF POC: NONE SEEN

## 2010-11-28 LAB — DIFFERENTIAL
Basophils Absolute: 0 10*3/uL (ref 0.0–0.1)
Basophils Relative: 1 % (ref 0–1)
Eosinophils Absolute: 0.1 10*3/uL (ref 0.0–0.7)
Eosinophils Relative: 3 % (ref 0–5)
Lymphocytes Relative: 35 % (ref 12–46)
Lymphs Abs: 1.5 10*3/uL (ref 0.7–4.0)
Monocytes Absolute: 0.3 10*3/uL (ref 0.1–1.0)
Monocytes Relative: 7 % (ref 3–12)
Neutro Abs: 2.4 10*3/uL (ref 1.7–7.7)
Neutrophils Relative %: 55 % (ref 43–77)

## 2010-11-28 LAB — COMPREHENSIVE METABOLIC PANEL
ALT: 10 U/L (ref 0–35)
AST: 16 U/L (ref 0–37)
Albumin: 4.9 g/dL (ref 3.5–5.2)
Alkaline Phosphatase: 43 U/L (ref 39–117)
BUN: 10 mg/dL (ref 6–23)
CO2: 26 mEq/L (ref 19–32)
Calcium: 9.3 mg/dL (ref 8.4–10.5)
Chloride: 100 mEq/L (ref 96–112)
Creatinine, Ser: 0.63 mg/dL (ref 0.4–1.2)
GFR calc Af Amer: >60 mL/min (ref >60)
GFR calc non Af Amer: >60 mL/min (ref >60)
Glucose, Bld: 87 mg/dL (ref 70–99)
Potassium: 3.3 mEq/L — ABNORMAL LOW (ref 3.5–5.1)
Sodium: 133 mEq/L — ABNORMAL LOW (ref 135–145)
Total Bilirubin: 0.5 mg/dL (ref 0.3–1.2)
Total Protein: 8 g/dL (ref 6.0–8.3)

## 2010-11-28 LAB — URINALYSIS, ROUTINE W REFLEX MICROSCOPIC
Bilirubin Urine: NEGATIVE
Glucose, UA: NEGATIVE mg/dL
Ketones, ur: NEGATIVE mg/dL
Leukocytes, UA: NEGATIVE
Nitrite: NEGATIVE
Protein, ur: NEGATIVE mg/dL
Specific Gravity, Urine: 1.024 (ref 1.005–1.030)
Urobilinogen, UA: 1 mg/dL (ref 0.0–1.0)
pH: 7 (ref 5.0–8.0)

## 2010-11-28 LAB — GC/CHLAMYDIA PROBE AMP, GENITAL
Chlamydia, DNA Probe: NEGATIVE
GC Probe Amp, Genital: NEGATIVE

## 2010-11-28 LAB — RPR: RPR Ser Ql: NONREACTIVE

## 2010-11-28 LAB — URINE MICROSCOPIC-ADD ON

## 2010-11-28 LAB — CBC
HCT: 41.4 % (ref 36.0–46.0)
Hemoglobin: 14.2 g/dL (ref 12.0–15.0)
MCHC: 34.2 g/dL (ref 30.0–36.0)
MCV: 96.1 fL (ref 78.0–100.0)
Platelets: 238 10*3/uL (ref 150–400)
RBC: 4.31 MIL/uL (ref 3.87–5.11)
RDW: 13.3 % (ref 11.5–15.5)
WBC: 4.4 10*3/uL (ref 4.0–10.5)

## 2010-11-28 LAB — LIPASE, BLOOD: Lipase: 17 U/L (ref 11–59)

## 2010-11-28 LAB — POCT PREGNANCY, URINE: Preg Test, Ur: NEGATIVE

## 2010-11-29 LAB — POCT URINALYSIS DIP (DEVICE)
Glucose, UA: NEGATIVE mg/dL
Ketones, ur: 15 mg/dL — AB
Nitrite: NEGATIVE
Protein, ur: 30 mg/dL — AB
Specific Gravity, Urine: 1.02 (ref 1.005–1.030)
Urobilinogen, UA: 0.2 mg/dL (ref 0.0–1.0)
pH: 6.5 (ref 5.0–8.0)

## 2010-11-29 LAB — POCT PREGNANCY, URINE: Preg Test, Ur: NEGATIVE

## 2010-12-14 LAB — POCT URINALYSIS DIP (DEVICE)
Bilirubin Urine: NEGATIVE
Glucose, UA: NEGATIVE mg/dL
Ketones, ur: NEGATIVE mg/dL
Nitrite: NEGATIVE
Protein, ur: 30 mg/dL — AB
Specific Gravity, Urine: 1.02 (ref 1.005–1.030)
Urobilinogen, UA: 0.2 mg/dL (ref 0.0–1.0)
pH: 7 (ref 5.0–8.0)

## 2010-12-14 LAB — WET PREP, GENITAL
Trich, Wet Prep: NONE SEEN
Yeast Wet Prep HPF POC: NONE SEEN

## 2010-12-14 LAB — POCT PREGNANCY, URINE
Preg Test, Ur: NEGATIVE
Preg Test, Ur: NEGATIVE

## 2010-12-14 LAB — GC/CHLAMYDIA PROBE AMP, GENITAL
Chlamydia, DNA Probe: NEGATIVE
GC Probe Amp, Genital: NEGATIVE

## 2010-12-14 LAB — URINALYSIS, ROUTINE W REFLEX MICROSCOPIC
Bilirubin Urine: NEGATIVE
Glucose, UA: NEGATIVE mg/dL
Nitrite: NEGATIVE
Protein, ur: NEGATIVE mg/dL
Specific Gravity, Urine: 1.016 (ref 1.005–1.030)
Urobilinogen, UA: 0.2 mg/dL (ref 0.0–1.0)
pH: 6.5 (ref 5.0–8.0)

## 2010-12-14 LAB — URINE MICROSCOPIC-ADD ON

## 2010-12-17 LAB — URINE MICROSCOPIC-ADD ON

## 2010-12-17 LAB — URINALYSIS, ROUTINE W REFLEX MICROSCOPIC
Bilirubin Urine: NEGATIVE
Glucose, UA: NEGATIVE mg/dL
Ketones, ur: 15 mg/dL — AB
Nitrite: NEGATIVE
Protein, ur: 100 mg/dL — AB
Specific Gravity, Urine: 1.026 (ref 1.005–1.030)
Urobilinogen, UA: 1 mg/dL (ref 0.0–1.0)
pH: 6.5 (ref 5.0–8.0)

## 2010-12-17 LAB — POCT PREGNANCY, URINE: Preg Test, Ur: NEGATIVE

## 2010-12-18 LAB — URINALYSIS, ROUTINE W REFLEX MICROSCOPIC
Bilirubin Urine: NEGATIVE
Glucose, UA: NEGATIVE mg/dL
Ketones, ur: NEGATIVE mg/dL
Nitrite: NEGATIVE
Protein, ur: NEGATIVE mg/dL
Specific Gravity, Urine: 1.04 — ABNORMAL HIGH (ref 1.005–1.030)
Urobilinogen, UA: 1 mg/dL (ref 0.0–1.0)
pH: 6 (ref 5.0–8.0)

## 2010-12-18 LAB — URINE CULTURE: Colony Count: 30000

## 2010-12-18 LAB — URINE MICROSCOPIC-ADD ON

## 2010-12-18 LAB — POCT PREGNANCY, URINE: Preg Test, Ur: NEGATIVE

## 2010-12-20 LAB — URINALYSIS, ROUTINE W REFLEX MICROSCOPIC
Bilirubin Urine: NEGATIVE
Glucose, UA: NEGATIVE mg/dL
Hgb urine dipstick: NEGATIVE
Ketones, ur: NEGATIVE mg/dL
Nitrite: NEGATIVE
Protein, ur: NEGATIVE mg/dL
Specific Gravity, Urine: >1.03 — ABNORMAL HIGH (ref 1.005–1.030)
Urobilinogen, UA: 0.2 mg/dL (ref 0.0–1.0)
pH: 6 (ref 5.0–8.0)

## 2010-12-20 LAB — WET PREP, GENITAL
Trich, Wet Prep: NONE SEEN
Yeast Wet Prep HPF POC: NONE SEEN

## 2010-12-20 LAB — GC/CHLAMYDIA PROBE AMP, GENITAL
Chlamydia, DNA Probe: NEGATIVE
GC Probe Amp, Genital: NEGATIVE

## 2010-12-21 LAB — POCT I-STAT, CHEM 8
BUN: 12 mg/dL (ref 6–23)
Calcium, Ion: 1.13 mmol/L (ref 1.12–1.32)
Chloride: 104 mEq/L (ref 96–112)
Creatinine, Ser: 0.7 mg/dL (ref 0.4–1.2)
Glucose, Bld: 105 mg/dL — ABNORMAL HIGH (ref 70–99)
HCT: 42 % (ref 36.0–46.0)
Hemoglobin: 14.3 g/dL (ref 12.0–15.0)
Potassium: 3.5 mEq/L (ref 3.5–5.1)
Sodium: 143 mEq/L (ref 135–145)
TCO2: 27 mmol/L (ref 0–100)

## 2010-12-21 LAB — URINALYSIS, ROUTINE W REFLEX MICROSCOPIC
Bilirubin Urine: NEGATIVE
Glucose, UA: NEGATIVE mg/dL
Ketones, ur: 15 mg/dL — AB
Nitrite: NEGATIVE
Protein, ur: 30 mg/dL — AB
Specific Gravity, Urine: 1.033 — ABNORMAL HIGH (ref 1.005–1.030)
Urobilinogen, UA: 1 mg/dL (ref 0.0–1.0)
pH: 7 (ref 5.0–8.0)

## 2010-12-21 LAB — URINE MICROSCOPIC-ADD ON

## 2010-12-21 LAB — POCT PREGNANCY, URINE: Preg Test, Ur: NEGATIVE

## 2010-12-26 LAB — DIFFERENTIAL
Basophils Absolute: 0.1 10*3/uL (ref 0.0–0.1)
Basophils Relative: 1 % (ref 0–1)
Eosinophils Absolute: 0.1 10*3/uL (ref 0.0–0.7)
Eosinophils Relative: 2 % (ref 0–5)
Lymphocytes Relative: 28 % (ref 12–46)
Lymphs Abs: 1.7 10*3/uL (ref 0.7–4.0)
Monocytes Absolute: 0.2 10*3/uL (ref 0.1–1.0)
Monocytes Relative: 4 % (ref 3–12)
Neutro Abs: 4.1 10*3/uL (ref 1.7–7.7)
Neutrophils Relative %: 65 % (ref 43–77)

## 2010-12-26 LAB — COMPREHENSIVE METABOLIC PANEL
ALT: 22 U/L (ref 0–35)
AST: 24 U/L (ref 0–37)
Albumin: 4.7 g/dL (ref 3.5–5.2)
Alkaline Phosphatase: 44 U/L (ref 39–117)
BUN: 11 mg/dL (ref 6–23)
CO2: 25 mEq/L (ref 19–32)
Calcium: 9.1 mg/dL (ref 8.4–10.5)
Chloride: 104 mEq/L (ref 96–112)
Creatinine, Ser: 0.57 mg/dL (ref 0.4–1.2)
GFR calc Af Amer: >60 mL/min (ref >60)
GFR calc non Af Amer: >60 mL/min (ref >60)
Glucose, Bld: 88 mg/dL (ref 70–99)
Potassium: 3.4 mEq/L — ABNORMAL LOW (ref 3.5–5.1)
Sodium: 140 mEq/L (ref 135–145)
Total Bilirubin: 0.6 mg/dL (ref 0.3–1.2)
Total Protein: 7.4 g/dL (ref 6.0–8.3)

## 2010-12-26 LAB — WET PREP, GENITAL
Trich, Wet Prep: NONE SEEN
Yeast Wet Prep HPF POC: NONE SEEN

## 2010-12-26 LAB — URINE MICROSCOPIC-ADD ON

## 2010-12-26 LAB — GC/CHLAMYDIA PROBE AMP, GENITAL
Chlamydia, DNA Probe: NEGATIVE
GC Probe Amp, Genital: NEGATIVE

## 2010-12-26 LAB — URINALYSIS, ROUTINE W REFLEX MICROSCOPIC
Bilirubin Urine: NEGATIVE
Glucose, UA: NEGATIVE mg/dL
Ketones, ur: NEGATIVE mg/dL
Leukocytes, UA: NEGATIVE
Nitrite: NEGATIVE
Protein, ur: NEGATIVE mg/dL
Specific Gravity, Urine: 1.028 (ref 1.005–1.030)
Urobilinogen, UA: 0.2 mg/dL (ref 0.0–1.0)
pH: 6.5 (ref 5.0–8.0)

## 2010-12-26 LAB — CBC
HCT: 40 % (ref 36.0–46.0)
Hemoglobin: 13.4 g/dL (ref 12.0–15.0)
MCHC: 33.6 g/dL (ref 30.0–36.0)
MCV: 95.4 fL (ref 78.0–100.0)
Platelets: 233 10*3/uL (ref 150–400)
RBC: 4.19 MIL/uL (ref 3.87–5.11)
RDW: 13.3 % (ref 11.5–15.5)
WBC: 6.2 10*3/uL (ref 4.0–10.5)

## 2010-12-26 LAB — PREGNANCY, URINE: Preg Test, Ur: NEGATIVE

## 2010-12-26 LAB — RPR: RPR Ser Ql: NONREACTIVE

## 2011-01-23 NOTE — Op Note (Signed)
Shirley [name], Shirley [name]                ACCOUNT NO.:  [id number]   MEDICAL RECORD NO.:  [id number]          PATIENT TYPE:  INP   LOCATION:  9131                          FACILITY:  [facility]   PHYSICIAN:  James A. Ashley Royalty, M.D.DATE OF BIRTH:  1975/10/14   DATE OF PROCEDURE:  05/31/2007  DATE OF DISCHARGE:                               OPERATIVE REPORT   PREOPERATIVE DIAGNOSES:  1. Intrauterine pregnancy at 36 weeks 3 days gestation.  2. Previous cesarean section.  3. Labor.  4. A low grade fever.   POSTOPERATIVE DIAGNOSES:  1. Intrauterine pregnancy at 36 weeks 3 days gestation.  2. Pelvic adhesions.  3. Previous cesarean section.  4. Labor.  5. A low grade fever.   PROCEDURES:  1. Repeat low transverse cesarean section.  2. Lysis of adhesions.   SURGEON:  Rudy Jew. Ashley Royalty, MD.   ANESTHESIA:  Spinal, then general.   FINDINGS:  A 6 pound 8 ounce female, Apgars 6 at 1 minute and 8 at 5  minutes.   ESTIMATED BLOOD LOSS:  800 mL.   COMPLICATIONS:  None.   SPECIMENS:  1. Placenta.  2. Aerobic and anaerobic cultures.   DRAINS:  Foley.   Sponge, needle and instrument reported as correct x2.   PROCEDURE:  The patient was taken to the operating room and placed in  the sitting position.  After spinal anesthetic was administered, she was  placed in the dorsal supine position and prepped and draped in usual  manner for abdominal surgery.  Foley catheter was placed.  A  Pfannenstiel incision was made through the patient's old skin incision.  The old scar was excised.  The subcutaneous tissues were sharply  dissected down to the fascia which was nicked with a knife and incised  transversely with Mayo scissors.  The underlying rectus muscles were  separated from the overlying fascia using sharp and blunt dissection.  The peritoneum was elevated with hemostats and entered atraumatically  with Metzenbaum scissors.  The incision was extended longitudinally.  There were numerous pelvic  adhesions noted.  These were dissected and  lysed as well as possible in order to gain access to the lower uterine  segment.  A bladder flap was created by incising the intrauterine serosa  and sharply and bluntly dissecting the bladder inferiorly.  The uterus  was then entered through a low transverse incision using sharp and blunt  dissection.  The infant was delivered from a vertex presentation in an  atraumatic manner.  During the delivery of the fetus, the patient began  to experience more discomfort.  The cord was triply clamped, cut and the  infant given immediately to the awaiting pediatrics team.  Arterial cord  pH was obtained from an isolated segment.  Regular cord blood was  obtained.  Placenta and membranes were removed in their entirety and  cultures obtained (aerobic and anaerobic).  The uterus was then closed  in two running layers of #1 Vicryl.  The first was a running locking  layer.  The second was a running, intermittently locking, and  imbricating layer.  During the  repair phase of the procedure, the  patient's discomfort increased.  An attempt was made to provide  Nesacaine directly into the incision.  This helped transiently, but  ultimately decision was made per anesthesia to place the patient under  general anesthetic.  This was accomplished without difficulty.  The  uterine closure was completed and hemostasis noted.  Copious irrigation  was accomplished.  Hemostasis was once again noted.  The fascia was then  closed with 0 Vicryl in a running fashion.  The skin was closed with  staples.   The patient tolerated the procedure reasonably well and was taken to the  recovery room in good condition.      James A. Ashley Royalty, M.D.  Electronically Signed     JAM/MEDQ  D:  06/02/2007  T:  06/02/2007  Job:  [id number]

## 2011-01-23 NOTE — Discharge Summary (Signed)
Shirley [name], Shirley [name]                ACCOUNT NO.:  [id number]   MEDICAL RECORD NO.:  [id number]          PATIENT TYPE:  INP   LOCATION:  9131                          [facility]   PHYSICIAN:  James A. Ashley Royalty, M.D.DATE OF BIRTH:  Aug 21, 1976   DATE OF ADMISSION:  05/31/2007  DATE OF DISCHARGE:  06/04/2007                               DISCHARGE SUMMARY   DISCHARGE DIAGNOSES:  1. Intrauterine pregnancy at 36 weeks 3 days gestation, delivered.  2. Preterm labor.  3. Pelvic adhesions.  4. Anemia - asymptomatic.   OPERATIONS AND PROCEDURES:  1. Repeat low transverse cesarean section.  2. Lysis of adhesions.   CONSULTATIONS:  None.   DISCHARGE MEDICATIONS:  1. Vicodin.  2. Chromagen.   HISTORY AND PHYSICAL:  This is a 30-year gravida 5, para 3-0-1-3 at 36  weeks 3 days gestation.  The patient has had previous cesarean section  x3.  She also is a smoker and has asthma.  She presented to maternity  admissions complaining of cramping.  Upon arrival in maternity  admissions, she was initially noted to have a temperature of 101.7  degrees Fahrenheit.  She admitted to exposures at home.  She was  initially noted to be having contractions approximately every 3 minutes.  Fetal heart rate was stable.  Cervix was fingertip, 70% effaced,  according to the R.N.  The patient was initially placed on outpatient  observation.  For the remainder of the history and physical, please see  chart.   HOSPITAL COURSE:  Observation continued and the patient was diagnosed on  June 08, 2007 at approximately 11:00 p.m. as having labor.  She was  hence taken to the operating room and underwent repeat low transverse  cesarean section.  Procedure revealed numerous pelvic adhesions which  were lysed as well.  The procedure yielded a 6-pound 8-ounce female,  Apgars 6 at one minute and 8 at five minutes, sent to newborn nursery.  Postoperative course was characterized only by an asymptomatic anemia.  The patient demonstrated no further temperature elevations whatsoever.  On June 04, 2007, she was felt to be a candidate for discharge and  was discharged home afebrile and in satisfactory condition.   DISPOSITION:  The patient is to return to [facility] in 6 weeks for postpartum evaluation.      James A. Ashley Royalty, M.D.  Electronically Signed     JAM/MEDQ  D:  07/15/2007  T:  07/16/2007  Job:  [id number]

## 2011-01-26 NOTE — H&P (Signed)
   NAME:  Shirley [name], Shirley [name]                          ACCOUNT NO.:  [id number]   MEDICAL RECORD NO.:  [id number]                   PATIENT TYPE:  INP   LOCATION:  NA                                   [facility]   PHYSICIAN:  James A. Ashley Royalty, M.D.             DATE OF BIRTH:  10-22-1975   DATE OF ADMISSION:  04/14/2002  DATE OF DISCHARGE:                                HISTORY & PHYSICAL   HISTORY OF PRESENT ILLNESS:  This is a 35 year old gravida 3, para 1, AB1;  EDC April 17, 2002 at 39 weeks and 4 days gestation.  Prenatal care has been  complicated by previous cesarean section (low transverse), history of  macrosomia, smoking, and large for gestational age infant.  Group B strep  was negative.  The patient was admitted for repeat cesarean section.   MEDICATIONS:  Vitamins.   PAST MEDICAL HISTORY:  Negative.   PAST SURGICAL HISTORY:  Cesarean section.   ALLERGIES:  None.   FAMILY HISTORY:  Noncontributory   SOCIAL HISTORY:  The patient denies the use of tobacco or significant  alcohol.   REVIEW OF SYSTEMS:  Noncontributory.   PHYSICAL EXAMINATION:  GENERAL:  Well developed, well nourished, pleasant  black female, in no acute distress.  VITAL SIGNS:  Afebrile, vital signs stable.  SKIN:  Warm and dry without lesions.  LYMPHATICS:  Significant for no supraclavicular, cervical or inguinal  adenopathy.  HEENT:  Normocephalic.  NECK:  Supple without thyromegaly.  CHEST:  Lungs are clear.  CARDIAC:  Regular rate and rhythm, without murmurs, gallops or rubs.  BREAST:  Deferred.  ABDOMEN:  Gravid with a term fundal height.  Fetal heart tones were  auscultated.  EXTREMITIES:  Full range of motion; without clubbing, cyanosis or edema.  PELVIC:  Performed during the exam under anesthesia.   IMPRESSION:  1. Intrauterine pregnancy at 39 weeks and 4 days gestation.  2. Previous cesarean section.  3. History of macrosomia.  4. Large for gestational age infant.    PLAN:   Repeat low transverse cesarean section.  The risks, benefits,  alternatives including VBAC discussed and accepted.  Questions were invited  and answered.                                                James A. Ashley Royalty, M.D.    JAM/MEDQ  D:  04/13/2002  T:  04/14/2002  Job:  [id number]

## 2011-01-26 NOTE — H&P (Signed)
NAMEHENRIETTE, [name]               ACCOUNT NO.:  [id number]   MEDICAL RECORD NO.:  [id number]          PATIENT TYPE:  INP   LOCATION:                                [facility]   PHYSICIAN:  James A. Ashley Royalty, M.D.DATE OF BIRTH:  Mar 23, 1976   DATE OF ADMISSION:  10/03/2005  DATE OF DISCHARGE:                                HISTORY & PHYSICAL   HISTORY OF PRESENT ILLNESS:  This is a 35 year old, gravida 4, para 2-0-1-2,  [facility] October 10, 2005, [redacted] weeks gestation.  Prenatal care has been  complicated by previous cesarean section x2, asthma, and smoking.  The  patient is for repeat cesarean section.   MEDICATIONS:  Vitamins.   PAST MEDICAL HISTORY:  Negative.   PAST SURGICAL HISTORY:  Cesarean sections x2.   ALLERGIES:  None.   FAMILY HISTORY:  None.   SOCIAL HISTORY:  The patient is a smoker.  She does not significantly use  alcohol.   REVIEW OF SYSTEMS:  Noncontributory.   PHYSICAL EXAMINATION:  GENERAL:  Well-developed, well-nourished, pleasant  black female in no acute distress.  VITAL SIGNS: Afebrile.  Vital signs stable.  SKIN: Warm and dry without lesions.  CHEST:  Lungs are clear.  CARDIAC:  Regular rate and rhythm.  ABDOMEN:  Gravid with term fundal height.  Fetal heart tones were  auscultated with the Doppler.  MUSCULOSKELETAL:  No CVA tenderness.  PELVIC:  Deferred.   IMPRESSION:  1.  Intrauterine pregnancy at [redacted] weeks gestation.  2.  Previous cesarean section x2.  3.  Asthma.  4.  Smoker.   PLAN:  Repeat cesarean section.  The risks, benefits, complications, and  alternatives were fully discussed with the patient. She states she  understands and accepts.  Questions invited and answered.      James A. Ashley Royalty, M.D.  Electronically Signed     JAM/MEDQ  D:  10/03/2005  T:  10/03/2005  Job:  [id number]

## 2011-01-26 NOTE — Discharge Summary (Signed)
   NAME:  Shirley, [name]                          ACCOUNT NO.:  [id number]   MEDICAL RECORD NO.:  [id number]                   PATIENT TYPE:  EMS   LOCATION:  ED                                   FACILITY:  [facility]   PHYSICIAN:  Rudy Jew. Ashley Royalty, M.D.             DATE OF BIRTH:  [birth year]   DATE OF ADMISSION:  04/14/2002  DATE OF DISCHARGE:  04/17/2002                                 DISCHARGE SUMMARY   DISCHARGE DIAGNOSES:  1. Intrauterine pregnancy at 39+ weeks gestation, delivered.  2. Previous cesarean section.  3. Large for gestational age infant.   PROCEDURE:  1. Repeat low transverse cesarean section.  2. Scar revision.   CONSULTATIONS:  None.   DISCHARGE MEDICATIONS:  Percocet.   HISTORY AND PHYSICAL:  This is a 35 year old gravida 3, para 1, AB1 at 39+  weeks gestation.  Previous pregnancy was complicated by cesarean section,  history of macrosomia.  Pregnancy also complicated by smoking and a large  for gestational age infant.  The patient was admitted for repeat cesarean  section.   HOSPITAL COURSE:  The patient was admitted to [facility] of  [place].  Admission laboratory studies were drawn.  On April 14, 2002 she  was taken to the operating room and underwent repeat low transverse cesarean  section and scar revision by Dr. Lonell Face.  The procedure revealed a 7  pound 11 ounce female, Apgars 8 at one minute, 9 at five minutes.  Sent to  the newborn nursery.  The patient's postoperative course was benign.  She  was discharged home on April 17, 2002 afebrile and in satisfactory  condition.   ACCESSORY CLINICAL FINDINGS:  Hemoglobin and hematocrit on admission were  11.1 and 32.0, respectively.  Repeat values obtained April 17, 2002 were 8.9  and 25.8, respectively.  The patient was given iron for discharge medication  as well.   DISPOSITION:  The patient is to return to [facility] in four to six  weeks for postoperative evaluation.                                             James A. Ashley Royalty, M.D.    JAM/MEDQ  D:  05/13/2002  T:  05/14/2002  Job:  [id number]

## 2011-01-26 NOTE — Discharge Summary (Signed)
Shirley [name]                ACCOUNT NO.:  [id number]   MEDICAL RECORD NO.:  [id number]          PATIENT TYPE:  INP   LOCATION:  9122                          FACILITY:  [facility]   PHYSICIAN:  James A. Ashley Royalty, M.D.DATE OF BIRTH:  [birth year]   DATE OF ADMISSION:  10/03/2005  DATE OF DISCHARGE:  10/06/2005                                 DISCHARGE SUMMARY   DISCHARGE DIAGNOSES:  1.  Intrauterine pregnancy at 30 weeks' gestation, delivered.  2.  Previous cesarean section.  3.  Asthma.  4.  Smoker.   PROCEDURE:  Repeat low transverse cesarean section.   CONSULTATIONS:  None.   DISCHARGE MEDICATIONS:  1.  Percocet.  2.  Motrin 600 mg.  3.  Micronor.  4.  Iron sulfate.   HISTORY OF PRESENT ILLNESS:  This is a 35 year old, G4, P2-0-1-2 at 21  weeks' gestation with the aforementioned risk factors.  The patient was  admitted with repeat cesarean section.  For further remainder of History and  Physical, please see chart.   HOSPITAL COURSE:  The patient was admitted to [facility] in [place].  Admission laboratory studies were drawn.  On October 03, 2005, the patient  was taken to the operating room and underwent repeat low transverse cesarean  section by Dr. Sylvester Harder.  The procedure yielded a 7 pound 4 ounce  female, Apgar's 8 and 9 and 1 minute and 5 minutes and take to the newborn  nursery.  Procedure was uncomplicated.  Postoperatively, the patient had a  mild anemia with a hemoglobin of 9.0.  Postpartum course was otherwise  uncomplicated.  She was discharged on postpartum day #3, afebrile and in  satisfactory condition.   DISPOSITION:  The patient is to return to [facility] and  Obstetrics in 4-6 weeks for postpartum evaluation.      James A. Ashley Royalty, M.D.  Electronically Signed     JAM/MEDQ  D:  10/18/2005  T:  10/18/2005  Job:  [id number]

## 2011-01-26 NOTE — Discharge Summary (Signed)
Shirley [name], Shirley [name]                ACCOUNT NO.:  [id number]   MEDICAL RECORD NO.:  [id number]          PATIENT TYPE:  INP   LOCATION:  9122                          FACILITY:  [facility]   PHYSICIAN:  James A. Ashley Royalty, M.D.DATE OF BIRTH:  Oct 26, 1975   DATE OF ADMISSION:  10/03/2005  DATE OF DISCHARGE:  10/06/2005                                 DISCHARGE SUMMARY   DISCHARGE DIAGNOSES:  1.  Intrauterine pregnancy at [redacted] weeks gestation, delivered.  2.  Previous cesarean section x2.  3.  Asthma.  4.  Smoker.   OPERATIONS AND SPECIAL PROCEDURES:  Repeat low-transverse cesarean section.   CONSULTATIONS:  None.   DISCHARGE MEDICATIONS:  Iron, Percocet, Motrin 600 mg, prenatal vitamins and  Micronor.   HISTORY AND PHYSICAL:  This is a 35 year old gravida 4, para 2-0-1-2 at [redacted]  weeks gestation with 2 previous cesarean sections and the aforementioned  risk factors.  The patient was stating a desire for repeat cesarean section.  For the remainder of the history and physical, please see chart.   HOSPITAL COURSE:  The patient was admitted to [facility] of  [place].  On October 03, 2005, she was taken to the operating room and  underwent repeat low-transverse cesarean section per Dr. Sylvester Harder with  Dr. Richardson Dopp assisting.  The procedure yielded a 7 pound 4 ounce female with  Apgars at 8 at one minute and 9 at five minutes, and sent to the newborn  nursery.  The patient's postoperative course was benign.  She was discharged  home on the third day afebrile and in satisfactory condition.   DISPOSITION:  The patient is to return to [facility] and Obstetrics  in [place] for postpartum evaluation.      James A. Ashley Royalty, M.D.  Electronically Signed     JAM/MEDQ  D:  11/07/2005  T:  11/07/2005  Job:  [id number]

## 2011-01-26 NOTE — Op Note (Signed)
NAME:  Shirley [name], Shirley [name]                          ACCOUNT NO.:  [id number]   MEDICAL RECORD NO.:  [id number]                   PATIENT TYPE:  INP   LOCATION:  9120                                 FACILITY:  [facility]   PHYSICIAN:  James A. Ashley Royalty, M.D.             DATE OF BIRTH:  08-Sep-1976   DATE OF PROCEDURE:  04/14/2002  DATE OF DISCHARGE:                                 OPERATIVE REPORT   PREOPERATIVE DIAGNOSES:  1. Intrauterine pregnancy at 39+ weeks gestation.  2. Previous cesarean section.   POSTOPERATIVE DIAGNOSES:  1. Intrauterine pregnancy at 39+ weeks gestation.  2. Previous cesarean section.   PROCEDURE:  1. Repeat low transverse cesarean section.  2. Scar revision.   SURGEON:  Rudy Jew. Ashley Royalty, M.D.   ANESTHESIA:  Spinal.   FINDINGS:  A 7 pound 11 ounce female.  Apgars 8 at one minute, 9 at five  minutes.  Sent to the newborn nursery.   ESTIMATED BLOOD LOSS:  600 cc.   COMPLICATIONS:  None.   PACKS AND DRAINS:  Foley.   COUNTS:  Sponge, needle, instrument count reported as correct x2.   PROCEDURE:  The patient was taken to the operating room and placed in the  sitting position.  After spinal anesthetic was administered she was placed  in the dorsal supine position and prepped and draped in the usual manner for  abdominal surgery.  Foley catheter was placed.  A Pfannenstiel incision was  made down to the level of the fascia.  The old scar was excised.  The fascia  was nicked with a knife and incised transversely with Mayo scissors.  The  underlying rectus muscles were separated from the overlying fascia using  sharp and blunt dissection.  Small defect in the fascia on the left upper  aspect of the operative field was easily closed with 0 Vicryl in an  interrupted fascia.  The rectus muscles were separated in the midline  exposing the peritoneum which was elevated with hemostats and entered  atraumatically with Metzenbaum scissors.  Skin incision was extended  longitudinally.  There were some omental adhesions to the uterus which were  excised in order to gain access to the anterior aspect of the uterus.  The  uterus was identified and a bladder flap created by incising the anterior  uterine serosa and sharply and bluntly dissecting the bladder inferiorly.  This was held in place with the bladder blade.  Uterus was then entered  through a low transverse incision using sharp and blunt dissection.  The  fluid was clear.  The infant was delivered through a vertex presentation in  an atraumatic manner.  Infant was suctioned.  Cord was doubly clamped, cut,  and the infant given immediately to the awaiting pediatrics team.  Cord  blood was obtained.  The placenta and membranes were removed in their  entirety and set side.  The uterus was exteriorized.  The uterus was then  closed in two running layers with 1 Vicryl.  The first was a running locking  layer.  The second was a running, intermittently locking, and embrocating  layer.  Additional figure-of-eight sutures were required to obtain  hemostasis.  Hemostasis was noted.  The uterus, tubes, and ovaries were  inspected and found to be normal and were returned to the abdominal cavity.  Copious irrigation was accomplished.  Hemostasis was noted.  The rectus  muscles were reapproximated in the midline using 0 Vicryl in an interrupted  fashion.  The fascia was then closed with 0 Vicryl in a running fashion.  The skin was closed with staples.  The patient tolerated procedure extremely  well and was returned to the recovery room in good condition.                                               James A. Ashley Royalty, M.D.    JAM/MEDQ  D:  04/14/2002  T:  04/17/2002  Job:  [id number]

## 2011-06-05 LAB — POCT I-STAT, CHEM 8
BUN: 11
Calcium, Ion: 1.09 — ABNORMAL LOW
Chloride: 99
Creatinine, Ser: 0.9
Glucose, Bld: 110 — ABNORMAL HIGH
HCT: 40
Hemoglobin: 13.6
Potassium: 3 — ABNORMAL LOW
Sodium: 138
TCO2: 27

## 2011-06-05 LAB — CBC
HCT: 39.1
Hemoglobin: 13.9
MCHC: 35.6
MCV: 91.2
Platelets: 141 — ABNORMAL LOW
RBC: 4.28
RDW: 13.1
WBC: 2 — ABNORMAL LOW

## 2011-06-05 LAB — DIFFERENTIAL
Basophils Absolute: 0
Basophils Relative: 1
Eosinophils Absolute: 0
Eosinophils Relative: 1
Lymphocytes Relative: 61 — ABNORMAL HIGH
Lymphs Abs: 1.3
Monocytes Absolute: 0.2
Monocytes Relative: 10
Neutro Abs: 0.5 — ABNORMAL LOW
Neutrophils Relative %: 27 — ABNORMAL LOW

## 2011-06-05 LAB — RAPID URINE DRUG SCREEN, HOSP PERFORMED
Amphetamines: NOT DETECTED
Barbiturates: NOT DETECTED
Benzodiazepines: NOT DETECTED
Cocaine: NOT DETECTED
Opiates: POSITIVE — AB
Tetrahydrocannabinol: NOT DETECTED

## 2011-06-05 LAB — POCT PREGNANCY, URINE
Operator id: 277751
Preg Test, Ur: NEGATIVE

## 2011-06-05 LAB — D-DIMER, QUANTITATIVE: D-Dimer, Quant: <0.22

## 2011-06-08 LAB — POCT I-STAT, CHEM 8
BUN: 6
BUN: 12
Calcium, Ion: 1.1 — ABNORMAL LOW
Calcium, Ion: 1.16
Chloride: 103
Chloride: 107
Creatinine, Ser: 0.8
Creatinine, Ser: 0.8
Glucose, Bld: 98
Glucose, Bld: 106 — ABNORMAL HIGH
HCT: 44
HCT: 45
Hemoglobin: 15
Hemoglobin: 15.3 — ABNORMAL HIGH
Potassium: 3.2 — ABNORMAL LOW
Potassium: 3.4 — ABNORMAL LOW
Sodium: 140
Sodium: 142
TCO2: 26
TCO2: 25

## 2011-06-08 LAB — PREGNANCY, URINE
Preg Test, Ur: NEGATIVE
Preg Test, Ur: NEGATIVE

## 2011-06-08 LAB — CBC
HCT: 42.7
HCT: 42.9
Hemoglobin: 14.5
Hemoglobin: 14.8
MCHC: 34
MCHC: 34.5
MCV: 94.6
MCV: 93.5
Platelets: 187
Platelets: 260
RBC: 4.51
RBC: 4.59
RDW: 12.1
RDW: 12.2
WBC: 12.2 — ABNORMAL HIGH
WBC: 5.7

## 2011-06-08 LAB — URINALYSIS, ROUTINE W REFLEX MICROSCOPIC
Bilirubin Urine: NEGATIVE
Glucose, UA: NEGATIVE
Glucose, UA: NEGATIVE
Ketones, ur: 15 — AB
Ketones, ur: 15 — AB
Nitrite: POSITIVE — AB
Nitrite: POSITIVE — AB
Protein, ur: 100 — AB
Protein, ur: 100 — AB
Specific Gravity, Urine: 1.013
Specific Gravity, Urine: 1.039 — ABNORMAL HIGH
Urobilinogen, UA: 0.2
Urobilinogen, UA: 1
pH: 6
pH: 6

## 2011-06-08 LAB — DIFFERENTIAL
Basophils Absolute: 0
Basophils Absolute: 0
Basophils Relative: 0
Basophils Relative: 0
Eosinophils Absolute: 0
Eosinophils Absolute: 0
Eosinophils Relative: 0
Eosinophils Relative: 1
Lymphocytes Relative: 12
Lymphocytes Relative: 19
Lymphs Abs: 1.4
Lymphs Abs: 1.1
Monocytes Absolute: 0.5
Monocytes Absolute: 0.3
Monocytes Relative: 4
Monocytes Relative: 6
Neutro Abs: 10.2 — ABNORMAL HIGH
Neutro Abs: 4.2
Neutrophils Relative %: 84 — ABNORMAL HIGH
Neutrophils Relative %: 74

## 2011-06-08 LAB — URINE MICROSCOPIC-ADD ON

## 2011-06-08 LAB — POCT URINALYSIS DIP (DEVICE)
Glucose, UA: NEGATIVE
Ketones, ur: 40 — AB
Nitrite: NEGATIVE
Operator id: 282151
Protein, ur: 100 — AB
Specific Gravity, Urine: >=1.03
Urobilinogen, UA: 1
pH: 5.5

## 2011-06-08 LAB — URINE CULTURE: Colony Count: >=100000

## 2011-06-19 LAB — I-STAT 8, (EC8 V) (CONVERTED LAB)
Acid-base deficit: 1
BUN: 18
Bicarbonate: 23.8
Chloride: 107
Glucose, Bld: 94
HCT: 43
Hemoglobin: 14.6
Operator id: 235561
Potassium: 3.6
Sodium: 141
TCO2: 25
pCO2, Ven: 39.2 — ABNORMAL LOW
pH, Ven: 7.391 — ABNORMAL HIGH

## 2011-06-19 LAB — POCT I-STAT CREATININE
Creatinine, Ser: 0.8
Operator id: 235561

## 2011-06-21 LAB — URINE MICROSCOPIC-ADD ON

## 2011-06-21 LAB — CBC
HCT: 20.7 — ABNORMAL LOW
HCT: 20.8 — ABNORMAL LOW
HCT: 20.8 — ABNORMAL LOW
HCT: 24.4 — ABNORMAL LOW
HCT: 26.7 — ABNORMAL LOW
Hemoglobin: 7.1 — CL
Hemoglobin: 7.3 — CL
Hemoglobin: 7.3 — CL
Hemoglobin: 8.5 — ABNORMAL LOW
Hemoglobin: 9.3 — ABNORMAL LOW
MCHC: 34.5
MCHC: 34.8
MCHC: 35
MCHC: 35
MCHC: 35.1
MCV: 93
MCV: 93.3
MCV: 93.5
MCV: 93.6
MCV: 94.2
Platelets: 171
Platelets: 202
Platelets: 204
Platelets: 205
Platelets: 212
RBC: 2.22 — ABNORMAL LOW
RBC: 2.22 — ABNORMAL LOW
RBC: 2.22 — ABNORMAL LOW
RBC: 2.59 — ABNORMAL LOW
RBC: 2.87 — ABNORMAL LOW
RDW: 12.9
RDW: 12.9
RDW: 13
RDW: 13
RDW: 13.1
WBC: 12.3 — ABNORMAL HIGH
WBC: 15.8 — ABNORMAL HIGH
WBC: 6.2
WBC: 6.6
WBC: 8

## 2011-06-21 LAB — ANAEROBIC CULTURE

## 2011-06-21 LAB — WOUND CULTURE
Culture: NO GROWTH
Gram Stain: NONE SEEN

## 2011-06-21 LAB — TYPE AND SCREEN
ABO/RH(D): O POS
Antibody Screen: NEGATIVE
DAT, IgG: NEGATIVE

## 2011-06-21 LAB — URINALYSIS, ROUTINE W REFLEX MICROSCOPIC
Bilirubin Urine: NEGATIVE
Bilirubin Urine: NEGATIVE
Glucose, UA: NEGATIVE
Glucose, UA: NEGATIVE
Ketones, ur: 40 — AB
Ketones, ur: NEGATIVE
Leukocytes, UA: NEGATIVE
Nitrite: NEGATIVE
Nitrite: NEGATIVE
Protein, ur: NEGATIVE
Protein, ur: NEGATIVE
Specific Gravity, Urine: 1.01
Specific Gravity, Urine: 1.015
Urobilinogen, UA: 0.2
Urobilinogen, UA: 0.2
pH: 7
pH: 7.5

## 2011-06-21 LAB — RPR: RPR Ser Ql: NONREACTIVE

## 2011-11-17 ENCOUNTER — Encounter (HOSPITAL_COMMUNITY): Payer: Self-pay | Admitting: *Deleted

## 2011-11-17 ENCOUNTER — Emergency Department (HOSPITAL_COMMUNITY)
Admission: EM | Admit: 2011-11-17 | Discharge: 2011-11-17 | Disposition: A | Discharge status: Home / Self Care | Payer: Medicaid Other | Attending: Emergency Medicine | Admitting: Emergency Medicine

## 2011-11-17 DIAGNOSIS — G43909 Migraine, unspecified, not intractable, without status migrainosus: Secondary | ICD-10-CM | POA: Insufficient documentation

## 2011-11-17 HISTORY — DX: Essential (primary) hypertension: I10

## 2011-11-17 MED ORDER — SODIUM CHLORIDE 0.9 % IV SOLN
INTRAVENOUS | Status: DC
  Administered 2011-11-17: 12:00:00 via INTRAVENOUS

## 2011-11-17 MED ORDER — PROMETHAZINE HCL 25 MG PO TABS: 25.0000 mg | ORAL_TABLET | Freq: Four times a day (QID) | ORAL | Status: DC | PRN

## 2011-11-17 MED ORDER — DIPHENHYDRAMINE HCL 50 MG/ML IJ SOLN
50.0000 mg | Freq: Once | INTRAMUSCULAR | Status: AC
  Administered 2011-11-17: 50 mg via INTRAVENOUS
  Filled 2011-11-17: qty 1

## 2011-11-17 MED ORDER — METOCLOPRAMIDE HCL 5 MG/ML IJ SOLN
10.0000 mg | Freq: Once | INTRAMUSCULAR | Status: AC
  Administered 2011-11-17: 10 mg via INTRAVENOUS
  Filled 2011-11-17: qty 2

## 2011-11-17 MED ORDER — KETOROLAC TROMETHAMINE 30 MG/ML IJ SOLN
30.0000 mg | Freq: Once | INTRAMUSCULAR | Status: AC
  Administered 2011-11-17: 30 mg via INTRAVENOUS
  Filled 2011-11-17: qty 1

## 2011-11-17 NOTE — ED Provider Notes (Signed)
History     CSN: [id number]  Arrival date & time 11/17/11  1058   First MD Initiated Contact with Patient 11/17/11 1122      Chief Complaint  Patient presents with  . Migraine    6 hr of persist headache. Unresponsive to Tylenol. Hx of same-every three months  . Nausea    denies vomiting    HPI Pt was seen at 1200.  Per pt, c/o gradual onset and persistence of constant acute flair of her chronic migraine headache since last night.  Describes the headache as per her usual chronic migraine headache pain pattern for "many" years.  Has been assoc with nausea.  States she ran out of her usual zomig and has not f/u with her Neuro MD.  Denies headache was sudden or maximal in onset or at any time.  Denies visual changes, no focal motor weakness, no tingling/numbness in extremities, no fevers, no neck pain, no rash.      Past Medical History  Diagnosis Date  . Migraine   . Hypertension     Past Surgical History  Procedure Date  . Cesarean section     Family History  Problem Relation Age of Onset  . Hypertension Mother     History  Substance Use Topics  . Smoking status: Current Everyday Smoker    Types: Cigarettes  . Smokeless tobacco: Not on file  . Alcohol Use: No    Review of Systems ROS: Statement: All systems negative except as marked or noted in the HPI; Constitutional: Negative for fever and chills. ; ; Eyes: Negative for eye pain, redness and discharge. ; ; ENMT: Negative for ear pain, hoarseness, nasal congestion, sinus pressure and sore throat. ; ; Cardiovascular: Negative for chest pain, palpitations, diaphoresis, dyspnea and peripheral edema. ; ; Respiratory: Negative for cough, wheezing and stridor. ; ; Gastrointestinal: +nausea. Negative for vomiting, diarrhea, abdominal pain, blood in stool, hematemesis, jaundice and rectal bleeding. ; ; Genitourinary: Negative for dysuria, flank pain and hematuria. ; ; Musculoskeletal: Negative for back pain and neck pain.  Negative for swelling and trauma.; ; Skin: Negative for pruritus, rash, abrasions, blisters, bruising and skin lesion.; ; Neuro: +migraine headache.  Negative for lightheadedness and neck stiffness. Negative for weakness, altered level of consciousness , altered mental status, extremity weakness, paresthesias, involuntary movement, seizure and syncope.     Allergies  Aspirin and Ibuprofen  Home Medications  No current outpatient prescriptions on file.  BP 134/77  Pulse 80  Temp(Src) 99 F (37.2 C) (Oral)  Resp 20  SpO2 100%  LMP 11/17/2011  Physical Exam 1205: Physical examination:  Nursing notes reviewed; Vital signs and O2 SAT reviewed;  Constitutional: Well developed, Well nourished, Well hydrated, Uncomfortable appearing; Head:  Normocephalic, atraumatic; Eyes: EOMI, PERRL, No scleral icterus; ENMT: Mouth and pharynx normal, Mucous membranes moist; Neck: Supple, Full range of motion, No lymphadenopathy; Cardiovascular: Regular rate and rhythm, No murmur, rub, or gallop; Respiratory: Breath sounds clear & equal bilaterally, No rales, rhonchi, wheezes, or rub, Normal respiratory effort/excursion; Chest: Nontender, Movement normal; Abdomen: Soft, Nontender, Nondistended, Normal bowel sounds; Extremities: Pulses normal, No tenderness, No edema, No calf edema or asymmetry.; Neuro: AA&Ox3, Major CN grossly intact. Speech clear, no facial droop.  No gross focal motor or sensory deficits in extremities.; Skin: Color normal, Warm, Dry, no rash.    ED Course  Procedures   MDM  MDM Reviewed: nursing note and vitals     3:46 PM:  Pt states she feels "  better now" and wants to go home.  Dx testing d/w pt.  Questions answered.  Verb understanding, agreeable to d/c home with outpt f/u.        Laray Anger, DO 11/19/11 1159

## 2011-11-17 NOTE — ED Notes (Signed)
Per EMS, pt from home with reports of migraine, nausea and photophobia, out of migraine meds prescribed by Neurologist.

## 2011-11-17 NOTE — ED Notes (Signed)
amb to bathtoom with assist

## 2013-12-09 ENCOUNTER — Encounter (HOSPITAL_COMMUNITY): Payer: Self-pay | Admitting: Emergency Medicine

## 2013-12-09 ENCOUNTER — Emergency Department (HOSPITAL_COMMUNITY): Payer: Medicaid Other

## 2013-12-09 ENCOUNTER — Emergency Department (HOSPITAL_COMMUNITY)
Admission: EM | Admit: 2013-12-09 | Discharge: 2013-12-09 | Disposition: A | Discharge status: Home / Self Care | Payer: Medicaid Other | Attending: Emergency Medicine | Admitting: Emergency Medicine

## 2013-12-09 DIAGNOSIS — Z3202 Encounter for pregnancy test, result negative: Secondary | ICD-10-CM | POA: Insufficient documentation

## 2013-12-09 DIAGNOSIS — I10 Essential (primary) hypertension: Secondary | ICD-10-CM | POA: Insufficient documentation

## 2013-12-09 DIAGNOSIS — R109 Unspecified abdominal pain: Secondary | ICD-10-CM

## 2013-12-09 DIAGNOSIS — F17200 Nicotine dependence, unspecified, uncomplicated: Secondary | ICD-10-CM | POA: Insufficient documentation

## 2013-12-09 DIAGNOSIS — Z79899 Other long term (current) drug therapy: Secondary | ICD-10-CM | POA: Insufficient documentation

## 2013-12-09 DIAGNOSIS — R1013 Epigastric pain: Secondary | ICD-10-CM | POA: Insufficient documentation

## 2013-12-09 DIAGNOSIS — R229 Localized swelling, mass and lump, unspecified: Secondary | ICD-10-CM | POA: Insufficient documentation

## 2013-12-09 LAB — CBC WITH DIFFERENTIAL/PLATELET
Basophils Absolute: 0 10*3/uL (ref 0.0–0.1)
Basophils Relative: 0 % (ref 0–1)
Eosinophils Absolute: 0.3 10*3/uL (ref 0.0–0.7)
Eosinophils Relative: 7 % — ABNORMAL HIGH (ref 0–5)
HCT: 33.1 % — ABNORMAL LOW (ref 36.0–46.0)
Hemoglobin: 11.2 g/dL — ABNORMAL LOW (ref 12.0–15.0)
Lymphocytes Relative: 35 % (ref 12–46)
Lymphs Abs: 1.4 10*3/uL (ref 0.7–4.0)
MCH: 30.8 pg (ref 26.0–34.0)
MCHC: 33.8 g/dL (ref 30.0–36.0)
MCV: 90.9 fL (ref 78.0–100.0)
Monocytes Absolute: 0.2 10*3/uL (ref 0.1–1.0)
Monocytes Relative: 5 % (ref 3–12)
Neutro Abs: 2.2 10*3/uL (ref 1.7–7.7)
Neutrophils Relative %: 53 % (ref 43–77)
Platelets: 287 10*3/uL (ref 150–400)
RBC: 3.64 MIL/uL — ABNORMAL LOW (ref 3.87–5.11)
RDW: 14.6 % (ref 11.5–15.5)
WBC: 4.1 10*3/uL (ref 4.0–10.5)

## 2013-12-09 LAB — POC URINE PREG, ED: Preg Test, Ur: NEGATIVE

## 2013-12-09 LAB — COMPREHENSIVE METABOLIC PANEL
ALT: 9 U/L (ref 0–35)
AST: 17 U/L (ref 0–37)
Albumin: 4.1 g/dL (ref 3.5–5.2)
Alkaline Phosphatase: 39 U/L (ref 39–117)
BUN: 10 mg/dL (ref 6–23)
CO2: 27 mEq/L (ref 19–32)
Calcium: 9.4 mg/dL (ref 8.4–10.5)
Chloride: 102 mEq/L (ref 96–112)
Creatinine, Ser: 0.6 mg/dL (ref 0.50–1.10)
GFR calc Af Amer: >90 mL/min (ref >90)
GFR calc non Af Amer: >90 mL/min (ref >90)
Glucose, Bld: 94 mg/dL (ref 70–99)
Potassium: 3.8 mEq/L (ref 3.7–5.3)
Sodium: 140 mEq/L (ref 137–147)
Total Bilirubin: <0.2 mg/dL — ABNORMAL LOW (ref 0.3–1.2)
Total Protein: 7.3 g/dL (ref 6.0–8.3)

## 2013-12-09 LAB — URINALYSIS, ROUTINE W REFLEX MICROSCOPIC
Bilirubin Urine: NEGATIVE
Glucose, UA: NEGATIVE mg/dL
Ketones, ur: NEGATIVE mg/dL
Leukocytes, UA: NEGATIVE
Nitrite: NEGATIVE
Protein, ur: NEGATIVE mg/dL
Specific Gravity, Urine: 1.005 (ref 1.005–1.030)
Urobilinogen, UA: 0.2 mg/dL (ref 0.0–1.0)
pH: 7 (ref 5.0–8.0)

## 2013-12-09 LAB — URINE MICROSCOPIC-ADD ON

## 2013-12-09 LAB — LIPASE, BLOOD: Lipase: 25 U/L (ref 11–59)

## 2013-12-09 IMAGING — CT CT ABD-PELV W/ CM
1 of 2 series · 15 of 32 positions shown, 19 images · IV contrast (OMNIPAQUE 300)
Comparison: Prior CT from [DATE]

CLINICAL DATA: Abdominal pain

EXAM:
CT ABDOMEN AND PELVIS WITH CONTRAST
TECHNIQUE: Multidetector CT imaging of the abdomen and pelvis was performed
using the standard protocol following bolus administration of
intravenous contrast.
CONTRAST:  50mL OMNIPAQUE IOHEXOL 300 MG/ML SOLN, 100mL OMNIPAQUE
IOHEXOL 300 MG/ML SOLN

[Series 2: abd/pel with · axial · 0.70mm/px · z∈[+1111,+1506]mm · 15 of 87 slices shown, 19 images]
[im 4/87  soft-tissue]
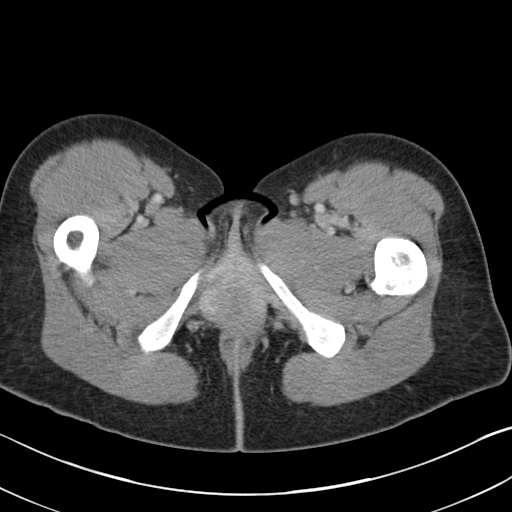
[im 4/87  bone]
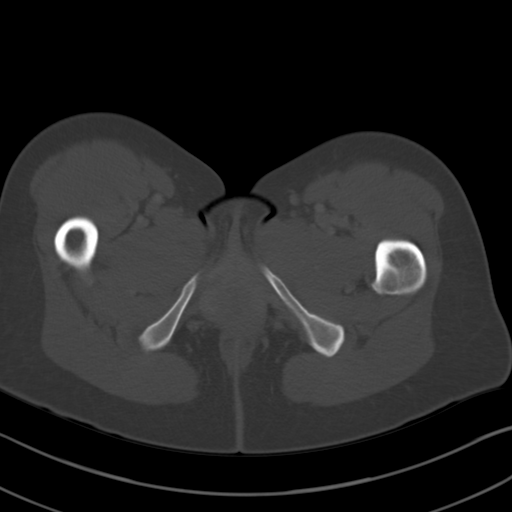
[im 12/87  soft-tissue]
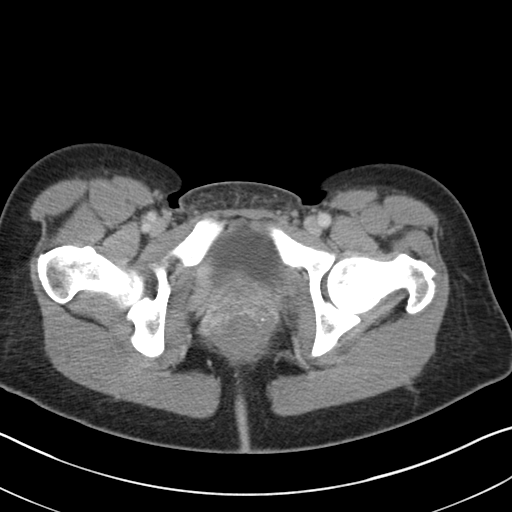
[im 19/87  soft-tissue]
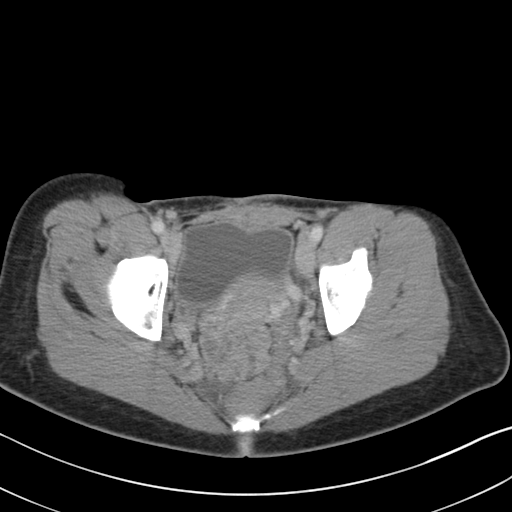
[im 23/87  soft-tissue]
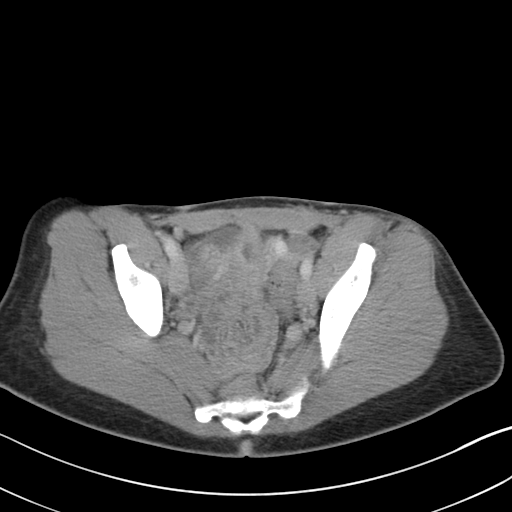
[im 30/87  soft-tissue]
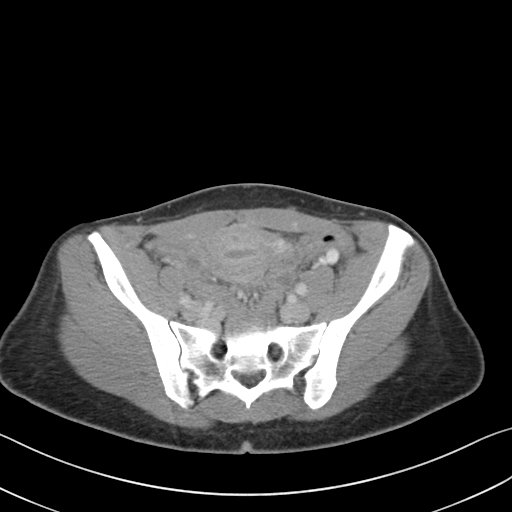
[im 38/87  soft-tissue]
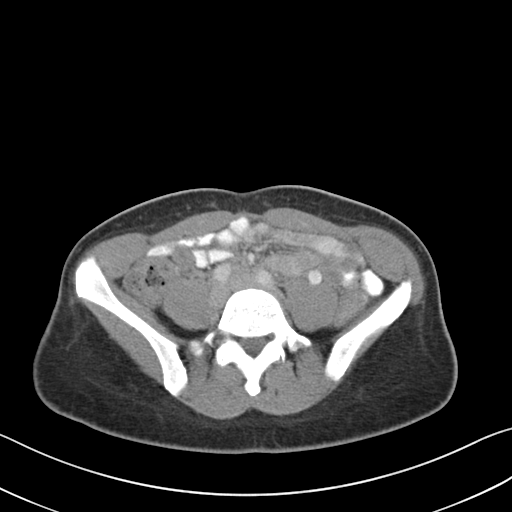
[im 45/87  soft-tissue]
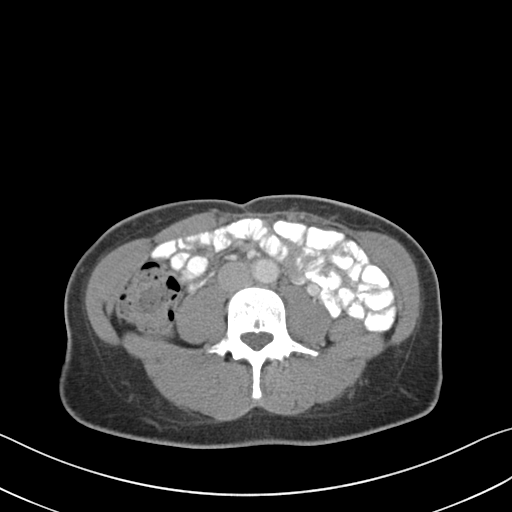
[im 49/87  soft-tissue]
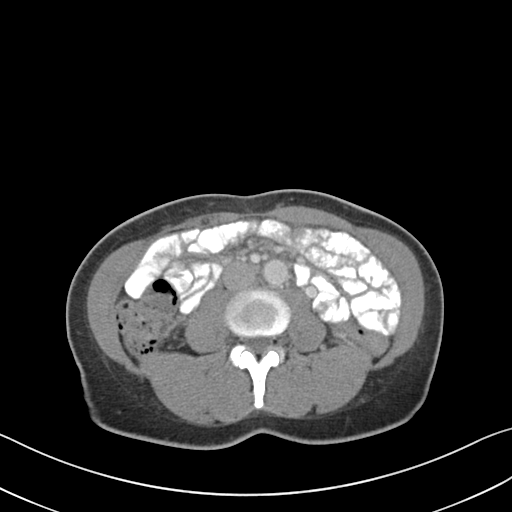
[im 57/87  soft-tissue]
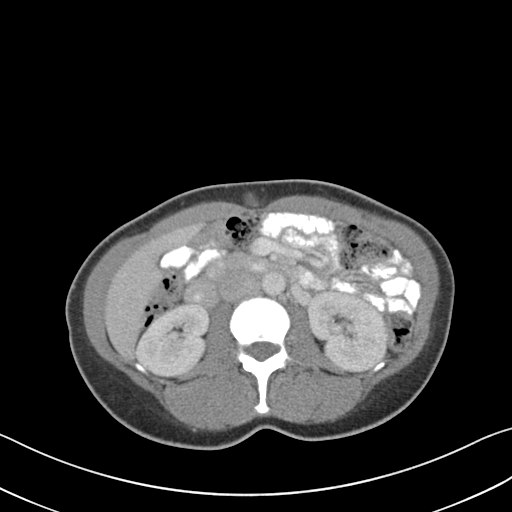
[im 57/87  bone]
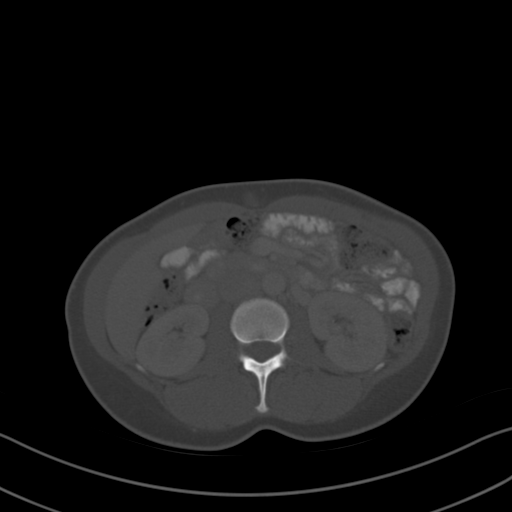
[im 64/87  soft-tissue]
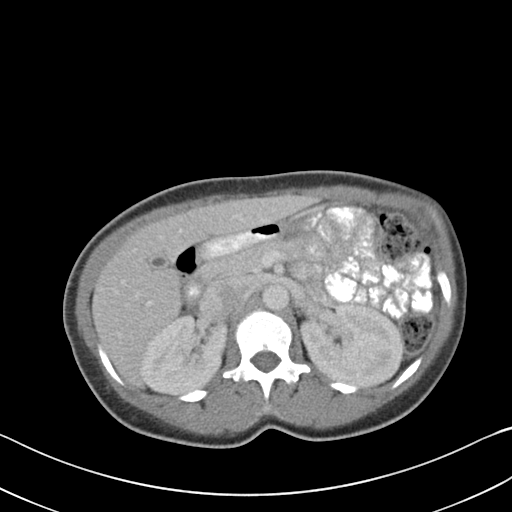
[im 68/87  soft-tissue]
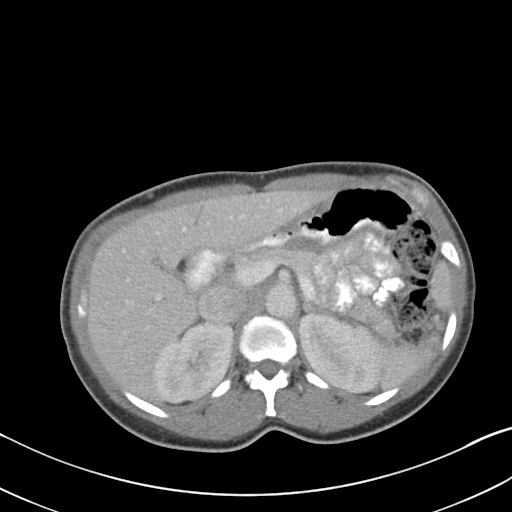
[im 72/87  lung]
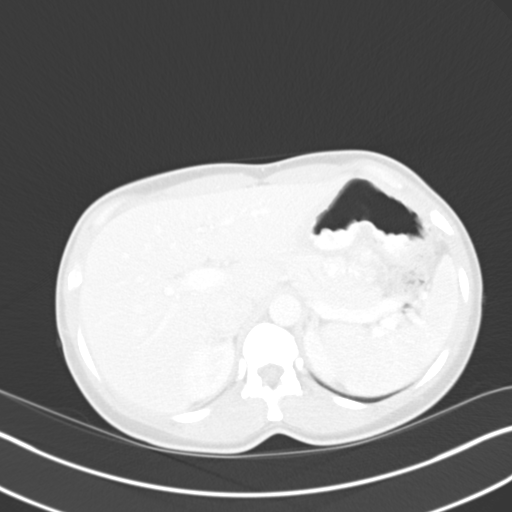
[im 75/87  soft-tissue]
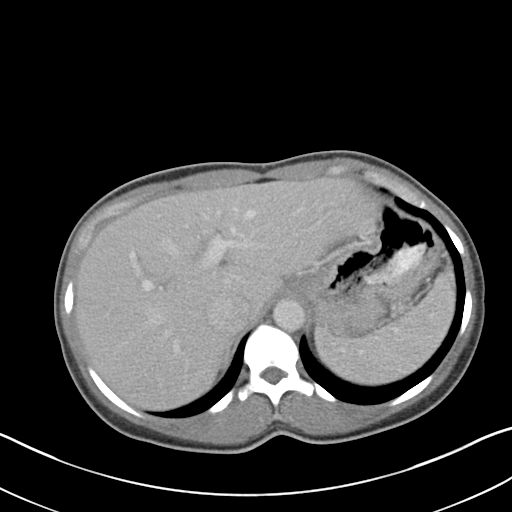
[im 75/87  lung]
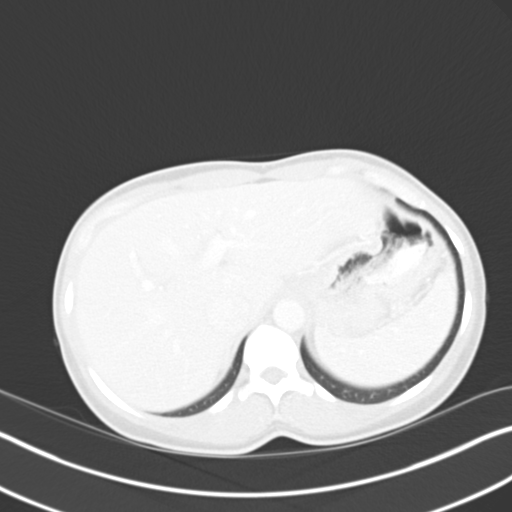
[im 79/87  lung]
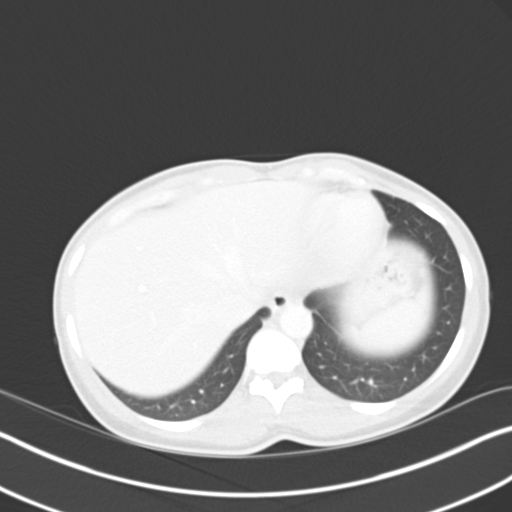
[im 83/87  soft-tissue]
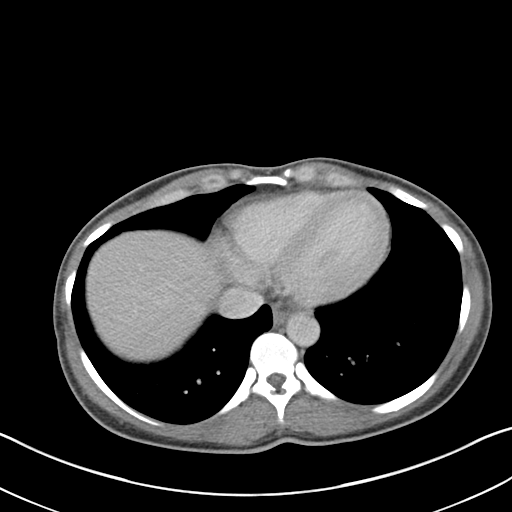
[im 83/87  lung]
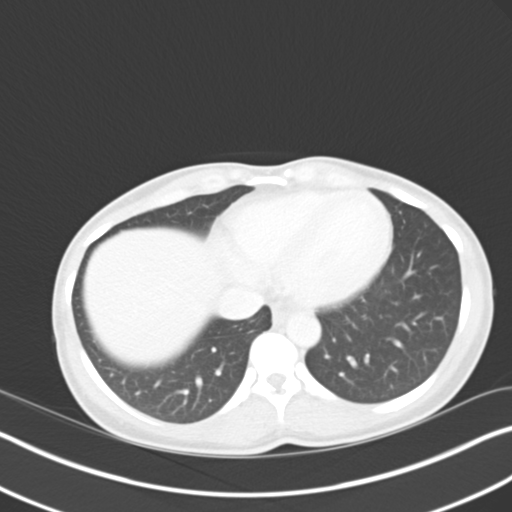

[15 of 32 positions shown; findings below may reference images not displayed]

FINDINGS: The visualized lung bases are clear.

The liver demonstrates a normal contrast enhanced appearance.
Gallbladder is largely decompressed but grossly normal. No biliary
ductal dilatation. The spleen, adrenal glands, and pancreas
demonstrate a normal contrast enhanced appearance.

The kidneys are equal size with symmetric enhancement. No
nephrolithiasis, hydronephrosis, or focal enhancing renal mass.
Subcentimeter hypodensity within the left kidney is too small the
characterize by CT, but likely represents a small cyst.

No evidence of bowel obstruction. Appendix is well visualized in the
right lower quadrant and is of normal caliber and appearance without
associated inflammatory changes to suggest acute appendicitis. No
abnormal wall thickening, mucosal enhancement, or inflammatory fat
stranding seen about the bowels.

Bladder is unremarkable. Uterus is within normal limits. 2.7 cm
physiologic cyst noted within the right ovary. Left ovary is
unremarkable.

Trace free fluid noted within the pelvic cul-de-sac, likely
physiologic. No free intraperitoneal air. No enlarged
intra-abdominal pelvic lymph nodes incidental note made of a
prominent left gonadal vein.

No acute osseus abnormality. No worrisome lytic or blastic osseous
lesions.

Irregular 1.2 cm hypodense nodule measuring 1.2 x 1.0 cm seen within
the subcutaneous fat of the upper mid abdomen (series 2, image 30).
No associated inflammatory soft tissue stranding nor is there
overlying skin thickening.
IMPRESSION: 1. No CT evidence of acute intra-abdominal pelvic process. Normal
appendix.
2. 1.2 cm hypodense nodule within the subcutaneous fat of the upper
mid abdomen as above. Finding is of uncertain etiology and clinical
significance. Correlation with physical exam recommended.

## 2013-12-09 MED ORDER — MORPHINE SULFATE 4 MG/ML IJ SOLN
4.0000 mg | INTRAMUSCULAR | Status: DC | PRN
  Administered 2013-12-09 (×2): 4 mg via INTRAVENOUS
  Filled 2013-12-09 (×2): qty 1

## 2013-12-09 MED ORDER — IOHEXOL 300 MG/ML  SOLN
50.0000 mL | Freq: Once | INTRAMUSCULAR | Status: AC | PRN
  Administered 2013-12-09: 50 mL via ORAL

## 2013-12-09 MED ORDER — HYDROCODONE-ACETAMINOPHEN 5-325 MG PO TABS: 1.0000 | ORAL_TABLET | ORAL | Status: DC | PRN

## 2013-12-09 MED ORDER — IOHEXOL 300 MG/ML  SOLN
100.0000 mL | Freq: Once | INTRAMUSCULAR | Status: AC | PRN
  Administered 2013-12-09: 100 mL via INTRAVENOUS

## 2013-12-09 NOTE — ED Notes (Signed)
Pt returned from CT °

## 2013-12-09 NOTE — ED Notes (Signed)
Patient transported to CT 

## 2013-12-09 NOTE — ED Notes (Signed)
Pt states abdominal pain with knot in abdomen x weeks.  Pt also states diarrhea last week x 3 days.  No nausea, vomiting or fever.  No difficulty with urination.

## 2013-12-09 NOTE — ED Provider Notes (Signed)
CSN: [id number]     Arrival date & time 12/09/13  1241 History   First MD Initiated Contact with Patient 12/09/13 1328     Chief Complaint  Patient presents with  . Abdominal Pain      HPI Patient reports feeling a knot in her epigastric region of the past several weeks.  She states it is tender and hurts with movement and palpation.  She denies nausea and vomiting.  No fevers or chills.  No urination problems.  No chest pain shortness of breath.  She states this discomfort in the small nodule that she feels has been constant and not improving.   Past Medical History  Diagnosis Date  . Migraine   . Hypertension    Past Surgical History  Procedure Laterality Date  . Cesarean section     Family History  Problem Relation Age of Onset  . Hypertension Mother    History  Substance Use Topics  . Smoking status: Current Every Day Smoker    Types: Cigarettes  . Smokeless tobacco: Not on file  . Alcohol Use: No   OB History   Grav Para Term Preterm Abortions TAB SAB Ect Mult Living                 Review of Systems  All other systems reviewed and are negative.      Allergies  Aspirin and Ibuprofen  Home Medications   Current Outpatient Rx  Name  Route  Sig  Dispense  Refill  . albuterol (PROVENTIL HFA;VENTOLIN HFA) 108 (90 BASE) MCG/ACT inhaler   Inhalation   Inhale 2 puffs into the lungs every 4 (four) hours as needed for wheezing or shortness of breath.         . Multiple Vitamin (MULTIVITAMIN WITH MINERALS) TABS tablet   Oral   Take 1 tablet by mouth daily.         Marland Kitchen. HYDROcodone-acetaminophen (NORCO/VICODIN) 5-325 MG per tablet   Oral   Take 1 tablet by mouth every 4 (four) hours as needed for moderate pain.   15 tablet   0    BP 153/101  Pulse 90  Temp(Src) 99 F (37.2 C) (Oral)  Resp 16  SpO2 100%  LMP 12/03/2013 Physical Exam  Nursing note and vitals reviewed. Constitutional: She is oriented to person, place, and time. She appears  well-developed and well-nourished. No distress.  HENT:  Head: Normocephalic and atraumatic.  Eyes: EOM are normal.  Neck: Normal range of motion.  Cardiovascular: Normal rate, regular rhythm and normal heart sounds.   Pulmonary/Chest: Effort normal and breath sounds normal.  Abdominal: Soft. She exhibits no distension.  Small palpable diastases of her upper abdomen.  Small non-mobile mass palpable.  No overlying skin changes.  Musculoskeletal: Normal range of motion.  Neurological: She is alert and oriented to person, place, and time.  Skin: Skin is warm and dry.  Psychiatric: She has a normal mood and affect. Judgment normal.    ED Course  Procedures (including critical care time) Labs Review Labs Reviewed  CBC WITH DIFFERENTIAL - Abnormal; Notable for the following:    RBC 3.64 (*)    Hemoglobin 11.2 (*)    HCT 33.1 (*)    Eosinophils Relative 7 (*)    All other components within normal limits  COMPREHENSIVE METABOLIC PANEL - Abnormal; Notable for the following:    Total Bilirubin <0.2 (*)    All other components within normal limits  URINALYSIS, ROUTINE W REFLEX MICROSCOPIC -  Abnormal; Notable for the following:    Hgb urine dipstick TRACE (*)    All other components within normal limits  LIPASE, BLOOD  URINE MICROSCOPIC-ADD ON  POC URINE PREG, ED   Imaging Review Ct Abdomen Pelvis W Contrast  12/09/2013   CLINICAL DATA:  Abdominal pain  EXAM: CT ABDOMEN AND PELVIS WITH CONTRAST  TECHNIQUE: Multidetector CT imaging of the abdomen and pelvis was performed using the standard protocol following bolus administration of intravenous contrast.  CONTRAST:  50mL OMNIPAQUE IOHEXOL 300 MG/ML SOLN, OMNIPAQUE IOHEXOL 300 MG/ML SOLN  COMPARISON:  Prior CT from 01/08/10  FINDINGS: The visualized lung bases are clear.  The liver demonstrates a normal contrast enhanced appearance. Gallbladder is largely decompressed but grossly normal. No biliary ductal dilatation. The spleen, adrenal  glands, and pancreas demonstrate a normal contrast enhanced appearance.  The kidneys are equal size with symmetric enhancement. No nephrolithiasis, hydronephrosis, or focal enhancing renal mass. Subcentimeter hypodensity within the left kidney is too small the characterize by CT, but likely represents a small cyst.  No evidence of bowel obstruction. Appendix is well visualized in the right lower quadrant and is of normal caliber and appearance without associated inflammatory changes to suggest acute appendicitis. No abnormal wall thickening, mucosal enhancement, or inflammatory fat stranding seen about the bowels.  Bladder is unremarkable. Uterus is within normal limits. 2.7 cm physiologic cyst noted within the right ovary. Left ovary is unremarkable.  Trace free fluid noted within the pelvic cul-de-sac, likely physiologic. No free intraperitoneal air. No enlarged intra-abdominal pelvic lymph nodes incidental note made of a prominent left gonadal vein.  No acute osseus abnormality. No worrisome lytic or blastic osseous lesions.  Irregular 1.2 cm hypodense nodule measuring 1.2 x 1.0 cm seen within the subcutaneous fat of the upper mid abdomen (series 2, image 30). No associated inflammatory soft tissue stranding nor is there overlying skin thickening.  IMPRESSION: 1. No CT evidence of acute intra-abdominal pelvic process. Normal appendix. 2. 1.2 cm hypodense nodule within the subcutaneous fat of the upper mid abdomen as above. Finding is of uncertain etiology and clinical significance. Correlation with physical exam recommended.   Electronically Signed   By: Rise Mu M.D.   On: 12/09/2013 15:52  I personally reviewed the imaging tests through PACS system I reviewed available ER/hospitalization records through the EMR    EKG Interpretation None      MDM   Final diagnoses:  Abdominal pain  Subcutaneous mass    No intra-abdominal pathology.  Patient be referred to general surgery.  Small  gastric subcutaneous nodule.  This may be fat containing hernia.    Lyanne Co, MD 12/09/13 [phone]

## 2013-12-31 ENCOUNTER — Ambulatory Visit (INDEPENDENT_AMBULATORY_CARE_PROVIDER_SITE_OTHER): Payer: Medicaid Other | Admitting: General Surgery

## 2013-12-31 ENCOUNTER — Encounter (INDEPENDENT_AMBULATORY_CARE_PROVIDER_SITE_OTHER): Payer: Self-pay | Admitting: General Surgery

## 2013-12-31 VITALS — BP 126/82 | HR 72 | Temp 98.0°F | Resp 14 | Ht 62.0 in | Wt 137.0 lb

## 2013-12-31 DIAGNOSIS — R229 Localized swelling, mass and lump, unspecified: Secondary | ICD-10-CM

## 2013-12-31 NOTE — Progress Notes (Signed)
Patient ID: Shirley [name], female   DOB: 29-Jun-1976, 38 y.o.   MRN: [id number]  Chief Complaint  Patient presents with  . New Evaluation    lesion in upper abd    HPI Shirley [name] is a 38 y.o. female.  The patient is a 38 year old female who is referred by Dr. Laqueta LindenKeeglo for an evaluation of a ventral subcutaneous mass versus possible hernia. The patient states she's noticed pain for the last 2 years. She states it's got worse over the last several months. Patient underwent CT scan which revealed a 1 x 1.2 cm ventral midline nodule.  The patient describes pain when lifting. She also says she has pain when she bumped her abdomen.  HPI  Past Medical History  Diagnosis Date  . Migraine   . Hypertension     Past Surgical History  Procedure Laterality Date  . Cesarean section      Family History  Problem Relation Age of Onset  . Hypertension Mother   . Cancer Father [id number]    colon    Social History History  Substance Use Topics  . Smoking status: Current Every Day Smoker -- 0.50 packs/day    Types: Cigarettes  . Smokeless tobacco: Not on file  . Alcohol Use: No    Allergies  Allergen Reactions  . Aspirin Nausea Only    Makes stomach burn  . Ibuprofen Nausea Only    Makes stomach burn    Current Outpatient Prescriptions  Medication Sig Dispense Refill  . albuterol (PROVENTIL HFA;VENTOLIN HFA) 108 (90 BASE) MCG/ACT inhaler Inhale 2 puffs into the lungs every 4 (four) hours as needed for wheezing or shortness of breath.      Marland Kitchen. HYDROcodone-acetaminophen (NORCO/VICODIN) 5-325 MG per tablet Take 1 tablet by mouth every 4 (four) hours as needed for moderate pain.  15 tablet  0  . Multiple Vitamin (MULTIVITAMIN WITH MINERALS) TABS tablet Take 1 tablet by mouth daily.       No current facility-administered medications for this visit.    Review of Systems Review of Systems  Constitutional: Negative.   HENT: Negative.   Respiratory: Negative.   Cardiovascular: Negative.     Gastrointestinal: Negative.   Neurological: Negative.   All other systems reviewed and are negative.   Blood pressure 126/82, pulse 72, temperature 98 F (36.7 C), temperature source Temporal, resp. rate 14, height 5\' 2"  (1.575 m), weight 137 lb (62.143 kg), last menstrual period 12/03/2013.  Physical Exam Physical Exam  Constitutional: She is oriented to person, place, and time. She appears well-developed and well-nourished.  HENT:  Head: Normocephalic and atraumatic.  Eyes: Conjunctivae and EOM are normal. Pupils are equal, round, and reactive to light.  Neck: Normal range of motion. Neck supple.  Cardiovascular: Normal rate, regular rhythm and normal heart sounds.   Pulmonary/Chest: Effort normal and breath sounds normal.  Abdominal: Soft. Bowel sounds are normal. She exhibits mass. She exhibits no distension. There is no tenderness. There is no rebound and no guarding.    Musculoskeletal: Normal range of motion.  Neurological: She is alert and oriented to person, place, and time.  Skin: Skin is warm and dry.  Psychiatric: She has a normal mood and affect.    Data Reviewed CT scan reveals a 1 x 1.2 cm midline Subcutaneous nodule   Assessment    38 year old female with a subcutaneous nodule versus a possible primary ventral hernia     Plan    1. Discussed with patient upon reviewing  the CT scan it is possible she could have a very small hernia and had preperitoneal fat herniating up to her abdominal wall. CT scan read reveals the nodule. At this point recommend open excision of the subcutaneous tissues nodule versus open ventral hernia repair with mesh. The patient would like to proceeded with surgery secondary to the pain and increasing size.  2. All risks and benefits were discussed with the patient, to generally include infection, bleeding, damage to surrounding structures, acute and chronic nerve pain, and recurrence. Alternatives were offered and described.  All  questions were answered and the patient voiced understanding of the procedure and wishes to proceed at this point.        Axel Fillerrmando Carlyon Nolasco 12/31/2013, 9:36 AM

## 2014-01-12 DIAGNOSIS — K439 Ventral hernia without obstruction or gangrene: Secondary | ICD-10-CM

## 2014-01-13 ENCOUNTER — Other Ambulatory Visit (INDEPENDENT_AMBULATORY_CARE_PROVIDER_SITE_OTHER): Payer: Self-pay

## 2014-01-13 MED ORDER — OXYCODONE-ACETAMINOPHEN 5-325 MG PO TABS: 1.0000 | ORAL_TABLET | Freq: Four times a day (QID) | ORAL | Status: DC | PRN

## 2014-01-27 ENCOUNTER — Encounter (INDEPENDENT_AMBULATORY_CARE_PROVIDER_SITE_OTHER): Payer: Medicaid Other | Admitting: General Surgery

## 2014-02-08 ENCOUNTER — Encounter (INDEPENDENT_AMBULATORY_CARE_PROVIDER_SITE_OTHER): Payer: Self-pay | Admitting: General Surgery

## 2014-02-08 ENCOUNTER — Ambulatory Visit (INDEPENDENT_AMBULATORY_CARE_PROVIDER_SITE_OTHER): Payer: Medicaid Other | Admitting: General Surgery

## 2014-02-08 VITALS — BP 130/80 | HR 71 | Temp 99.1°F | Resp 16 | Wt 133.0 lb

## 2014-02-08 DIAGNOSIS — Z9889 Other specified postprocedural states: Secondary | ICD-10-CM

## 2014-02-08 NOTE — Progress Notes (Signed)
Patient ID: Shirley [name], female   DOB: [birth year], 38 y.o.   MRN: [id number] Post op course The patient is a 38 year old female status post primary ventral hernia repair. The patient has been doing well postoperatively.  On Exam: Wounds are clean dry and intact   Assessment and Plan 38 year old female status post primary ventral hernia repair 1. The patient follow up as needed   Axel Filler, MD [facility] General [facility] Minimally Invasive Surgery Trauma & Emergency Surgery

## 2014-05-21 ENCOUNTER — Emergency Department (HOSPITAL_COMMUNITY)
Admission: EM | Admit: 2014-05-21 | Discharge: 2014-05-21 | Disposition: A | Discharge status: Home / Self Care | Payer: Medicaid Other | Attending: Emergency Medicine | Admitting: Emergency Medicine

## 2014-05-21 ENCOUNTER — Encounter (HOSPITAL_COMMUNITY): Payer: Self-pay | Admitting: Emergency Medicine

## 2014-05-21 DIAGNOSIS — Y9389 Activity, other specified: Secondary | ICD-10-CM | POA: Insufficient documentation

## 2014-05-21 DIAGNOSIS — I10 Essential (primary) hypertension: Secondary | ICD-10-CM | POA: Insufficient documentation

## 2014-05-21 DIAGNOSIS — Y92009 Unspecified place in unspecified non-institutional (private) residence as the place of occurrence of the external cause: Secondary | ICD-10-CM | POA: Diagnosis not present

## 2014-05-21 DIAGNOSIS — Z79899 Other long term (current) drug therapy: Secondary | ICD-10-CM | POA: Diagnosis not present

## 2014-05-21 DIAGNOSIS — Z7729 Contact with and (suspected ) exposure to other hazardous substances: Secondary | ICD-10-CM

## 2014-05-21 DIAGNOSIS — T5894XA Toxic effect of carbon monoxide from unspecified source, undetermined, initial encounter: Secondary | ICD-10-CM | POA: Diagnosis not present

## 2014-05-21 DIAGNOSIS — T59891A Toxic effect of other specified gases, fumes and vapors, accidental (unintentional), initial encounter: Secondary | ICD-10-CM | POA: Insufficient documentation

## 2014-05-21 DIAGNOSIS — F17200 Nicotine dependence, unspecified, uncomplicated: Secondary | ICD-10-CM | POA: Diagnosis not present
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
## 2014-06-02 ENCOUNTER — Emergency Department (HOSPITAL_COMMUNITY)
Admission: EM | Admit: 2014-06-02 | Discharge: 2014-06-02 | Disposition: A | Discharge status: Home / Self Care | Payer: Medicaid Other | Attending: Emergency Medicine | Admitting: Emergency Medicine

## 2014-06-02 ENCOUNTER — Emergency Department (HOSPITAL_COMMUNITY): Payer: Self-pay

## 2014-06-02 ENCOUNTER — Encounter (HOSPITAL_COMMUNITY): Payer: Self-pay | Admitting: Emergency Medicine

## 2014-06-02 DIAGNOSIS — I10 Essential (primary) hypertension: Secondary | ICD-10-CM | POA: Insufficient documentation

## 2014-06-02 DIAGNOSIS — Y93E2 Activity, laundry: Secondary | ICD-10-CM | POA: Insufficient documentation

## 2014-06-02 DIAGNOSIS — Y9289 Other specified places as the place of occurrence of the external cause: Secondary | ICD-10-CM | POA: Insufficient documentation

## 2014-06-02 DIAGNOSIS — S6990XA Unspecified injury of unspecified wrist, hand and finger(s), initial encounter: Principal | ICD-10-CM

## 2014-06-02 DIAGNOSIS — Z79899 Other long term (current) drug therapy: Secondary | ICD-10-CM | POA: Insufficient documentation

## 2014-06-02 DIAGNOSIS — S59919A Unspecified injury of unspecified forearm, initial encounter: Secondary | ICD-10-CM | POA: Insufficient documentation

## 2014-06-02 DIAGNOSIS — F17200 Nicotine dependence, unspecified, uncomplicated: Secondary | ICD-10-CM | POA: Insufficient documentation

## 2014-06-02 DIAGNOSIS — M25531 Pain in right wrist: Secondary | ICD-10-CM

## 2014-06-02 DIAGNOSIS — S59909A Unspecified injury of unspecified elbow, initial encounter: Secondary | ICD-10-CM | POA: Insufficient documentation

## 2014-06-02 DIAGNOSIS — IMO0002 Reserved for concepts with insufficient information to code with codable children: Secondary | ICD-10-CM | POA: Insufficient documentation

## 2014-06-02 IMAGING — CR DG WRIST COMPLETE 3+V*R*
4 series · 4 of 4 positions shown · non-contrast
Comparison: [DATE]

CLINICAL DATA: Pain post injury

EXAM:
RIGHT WRIST - COMPLETE 3+ VIEW

[x wrist pa right]
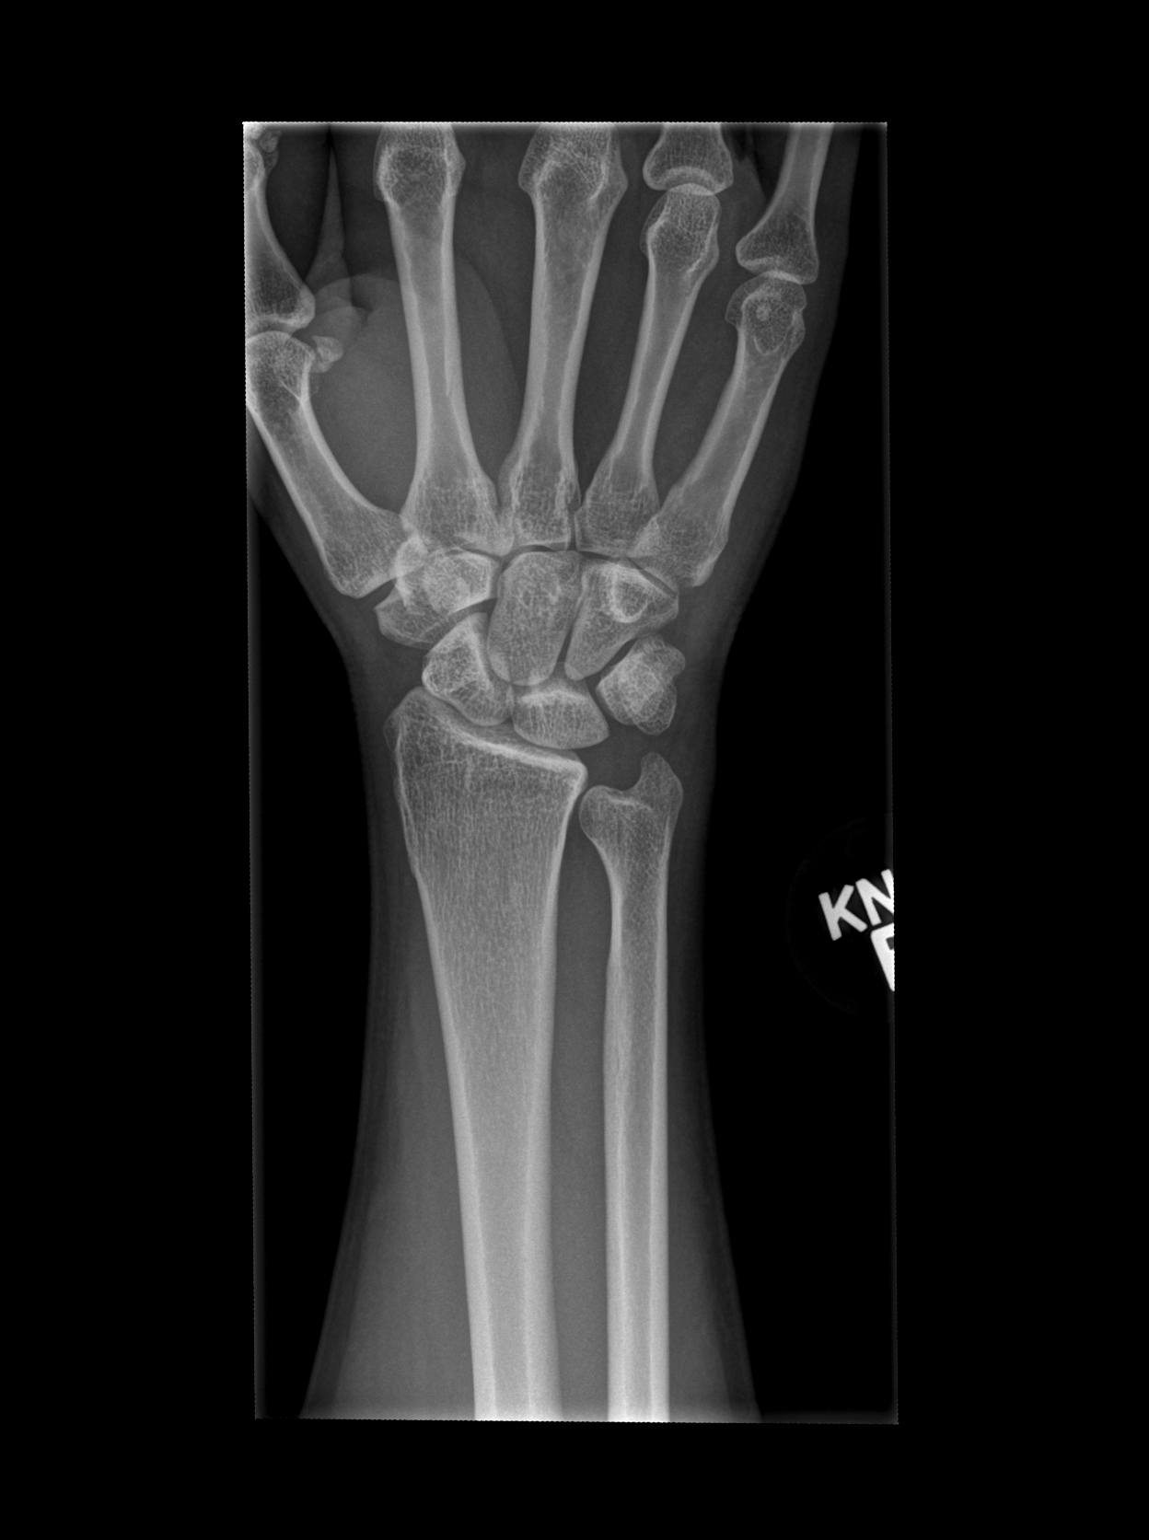

[x wrist obl right]
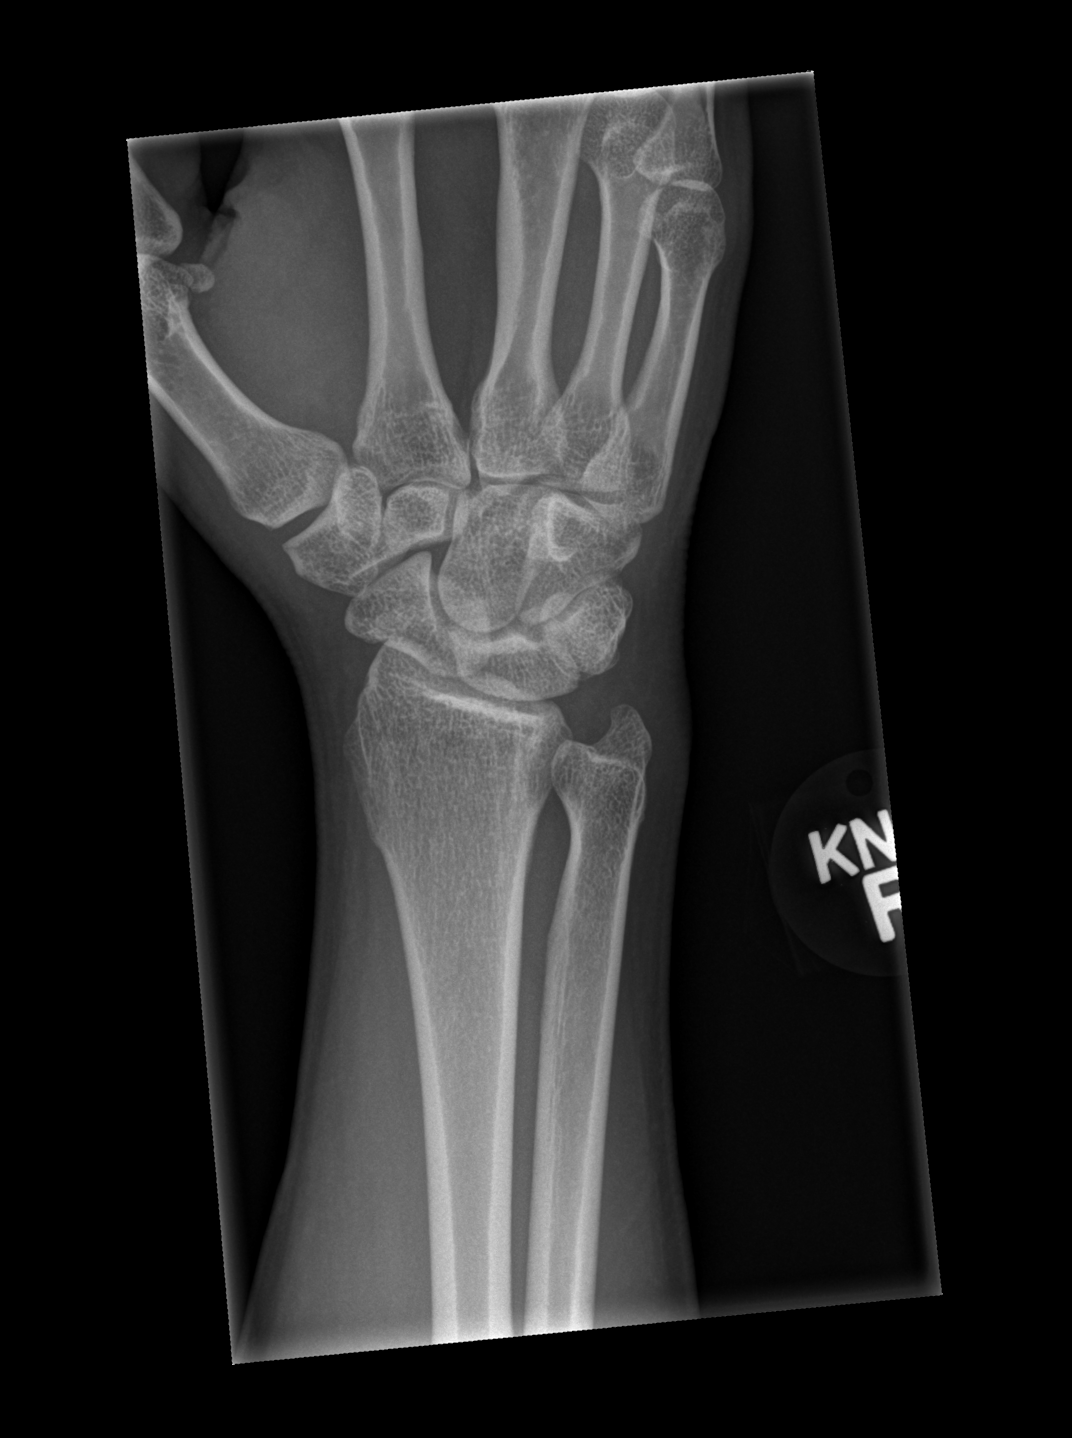

[x wrist lat right]
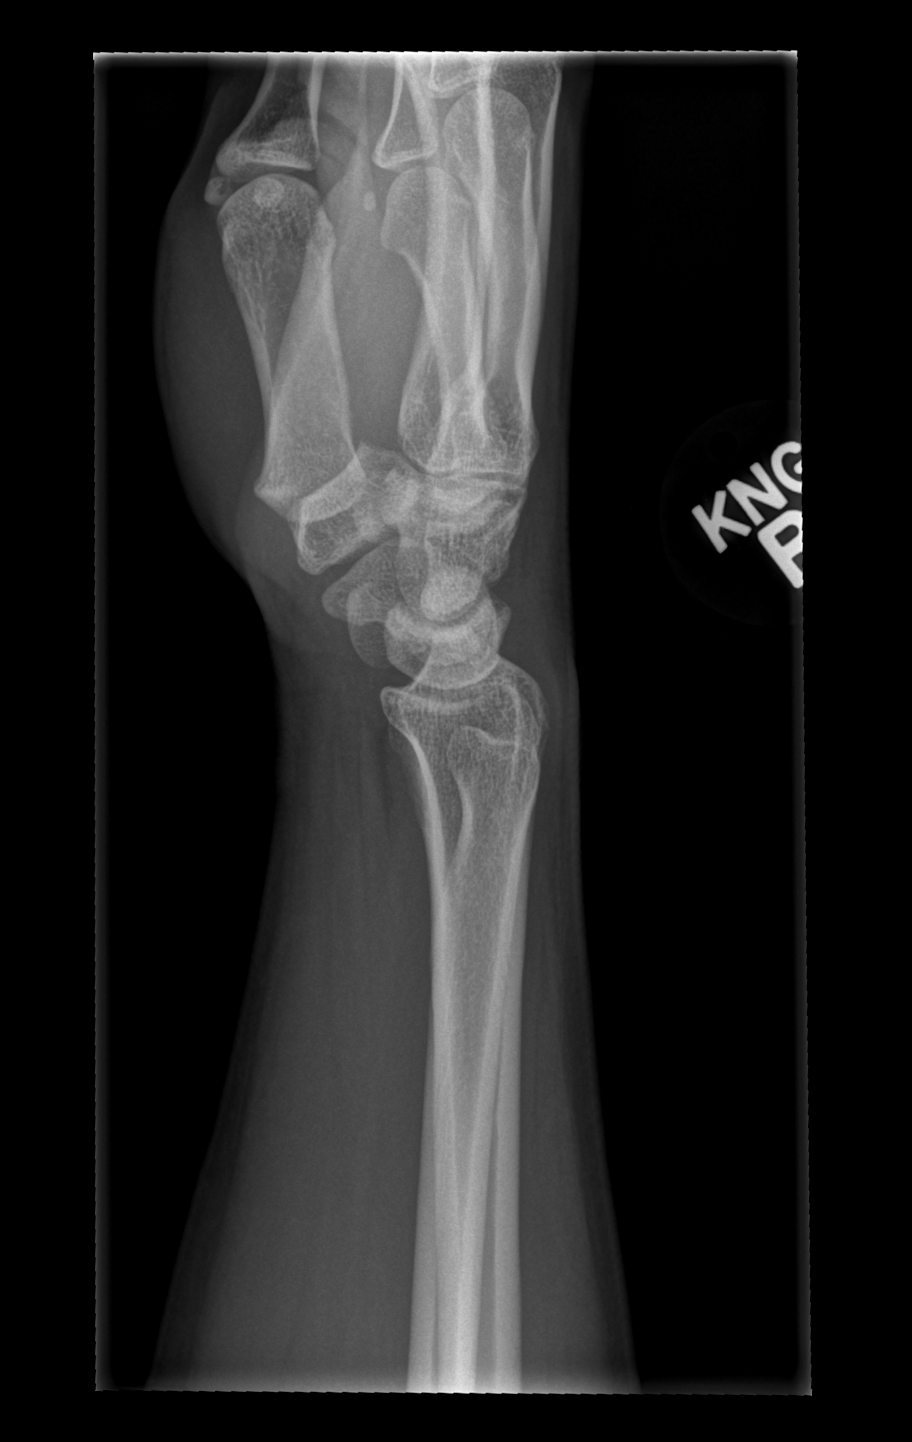

[x wrist navicular view right]
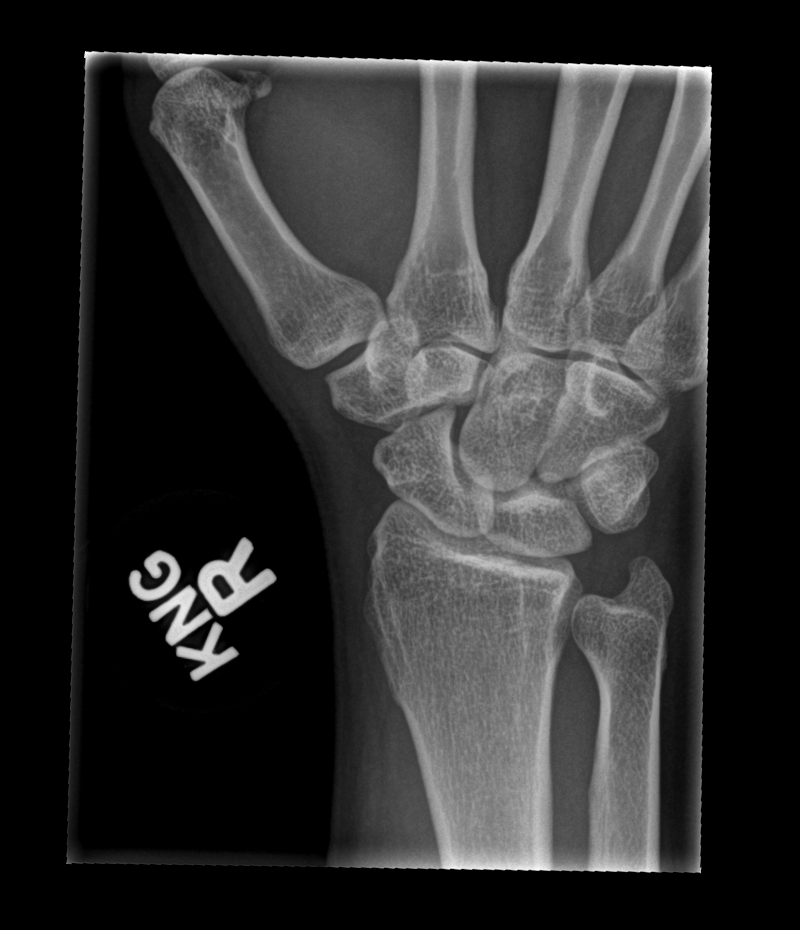

[4 of 4 positions shown; findings below may reference images not displayed]

FINDINGS: Four views of the right wrist submitted. No acute fracture or
subluxation. There is mild narrowing of radiocarpal joint space.
Mild negative ulnar variance.
IMPRESSION: No acute fracture or subluxation. Mild negative ulnar variance. Mild
narrowing of radiocarpal joint space.

## 2014-06-02 IMAGING — CR DG HAND COMPLETE 3+V*R*
3 series · 3 of 3 positions shown · non-contrast
Comparison: None.

CLINICAL DATA: Pain post injury

EXAM:
RIGHT HAND - COMPLETE 3+ VIEW

[x hand pa right]
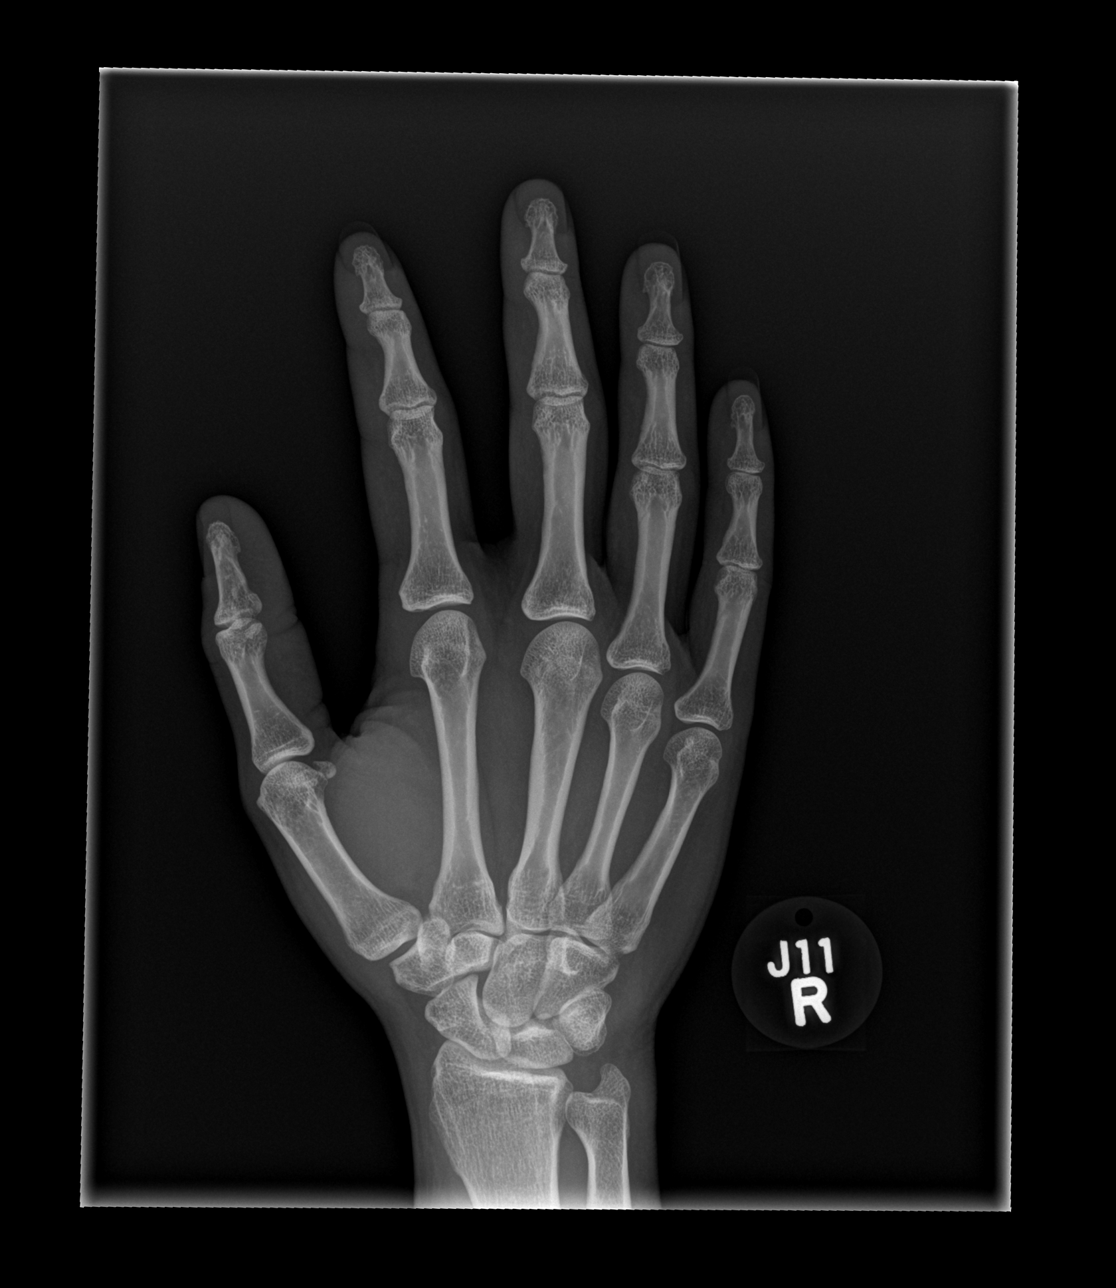

[x hand obl right]
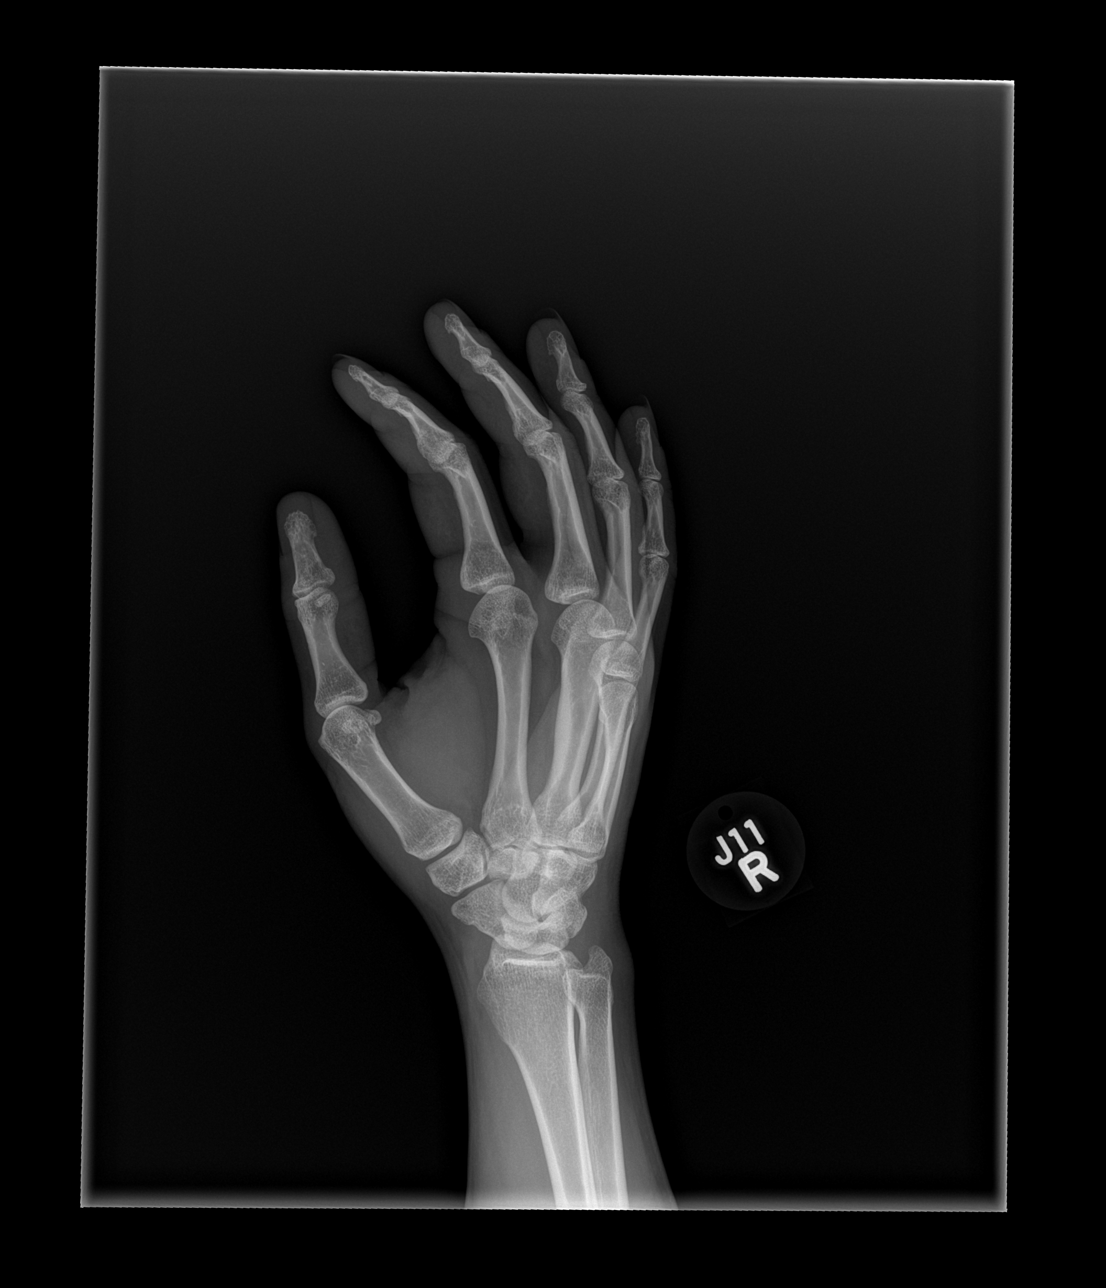

[x hand lat right]
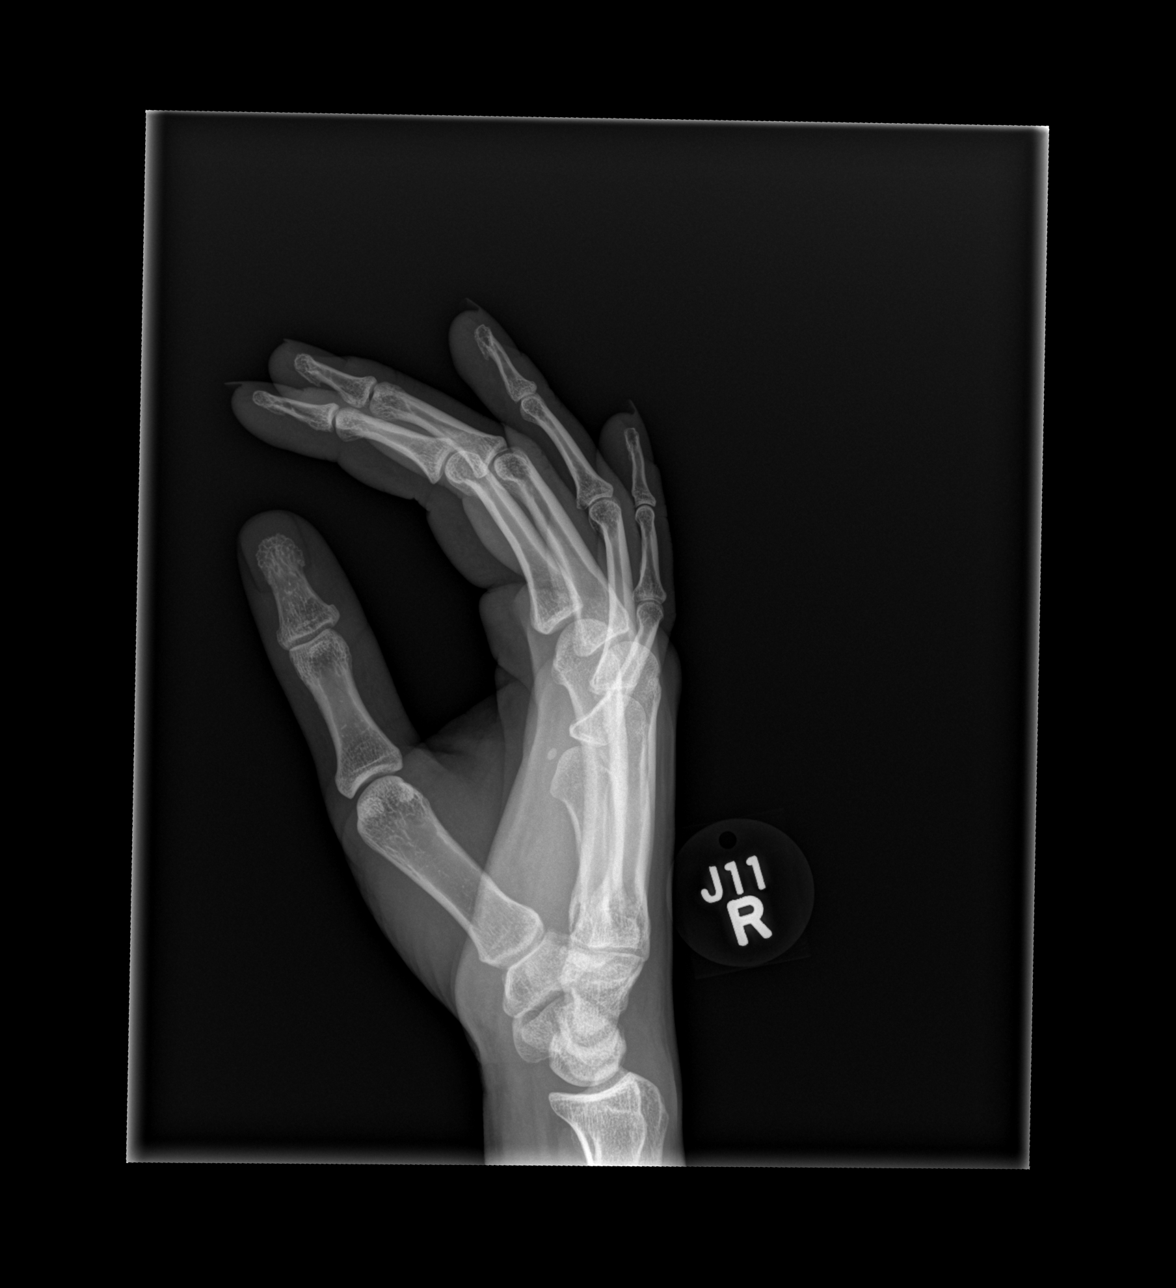

[3 of 3 positions shown; findings below may reference images not displayed]

FINDINGS: Three views of the right hand submitted. No acute fracture or
subluxation. No radiopaque foreign body. No periosteal reaction or
bony erosion. Mild narrowing of radiocarpal joint space.
IMPRESSION: Negative.

## 2014-06-02 MED ORDER — HYDROCODONE-ACETAMINOPHEN 5-325 MG PO TABS
1.0000 | ORAL_TABLET | Freq: Once | ORAL | Status: AC
  Administered 2014-06-02: 1 via ORAL
  Filled 2014-06-02: qty 1

## 2014-06-02 MED ORDER — HYDROCODONE-ACETAMINOPHEN 5-325 MG PO TABS: 1.0000 | ORAL_TABLET | Freq: Four times a day (QID) | ORAL | Status: DC | PRN

## 2014-06-02 NOTE — ED Notes (Signed)
Pt reports she hurt her right wrist a few days ago. Pain with bending wrist. Able to wiggle fingers. Pain 8/10.

## 2014-06-02 NOTE — ED Notes (Signed)
Ortho at bedside.

## 2014-06-02 NOTE — ED Notes (Signed)
Pt waiting on splint placement, then will be discharged.

## 2014-06-02 NOTE — ED Provider Notes (Signed)
CSN: [id number]     Arrival date & time 06/02/14  1004 History   First MD Initiated Contact with Patient 06/02/14 1006     Chief Complaint  Patient presents with  . Wrist Pain   Patient is a 38 y.o. female presenting with wrist pain.  Wrist Pain Associated symptoms include arthralgias and joint swelling. Pertinent negatives include no chills, fever, myalgias, nausea, rash or vomiting.    Patient is a 38 y.o female who presents to the ED with right wrist pain x 3 days.  Per the patient she was folding laundry and wacked her hand on a nearby desk.  Since that time she has had severe right wrist pain which has not improved at all.  Patient has tried taking tylenol and using an ace bandage without relief.  Patient states that she has never injured this hand before.  She is right hand dominant.  Patient states that movement makes her hand feel worse.  She states that she has a history of PUD and cannot take NSAIDs.    Past Medical History  Diagnosis Date  . Migraine   . Hypertension    Past Surgical History  Procedure Laterality Date  . Cesarean section     Family History  Problem Relation Age of Onset  . Hypertension Mother   . Cancer Father 9    colon   History  Substance Use Topics  . Smoking status: Current Every Day Smoker -- 0.50 packs/day    Types: Cigarettes  . Smokeless tobacco: Not on file  . Alcohol Use: No   OB History   Grav Para Term Preterm Abortions TAB SAB Ect Mult Living                 Review of Systems  Constitutional: Negative for fever and chills.  Gastrointestinal: Negative for nausea and vomiting.  Musculoskeletal: Positive for arthralgias and joint swelling. Negative for back pain, gait problem and myalgias.  Skin: Negative for rash and wound.  All other systems reviewed and are negative.     Allergies  Aspirin and Ibuprofen  Home Medications   Prior to Admission medications   Medication Sig Start Date End Date Taking? Authorizing Provider   acetaminophen (TYLENOL) 325 MG tablet Take 650 mg by mouth every 6 (six) hours as needed for mild pain (pain).   Yes Historical Provider, MD  HYDROcodone-acetaminophen (NORCO/VICODIN) 5-325 MG per tablet Take 1 tablet by mouth every 6 (six) hours as needed for moderate pain (pain).   Yes Historical Provider, MD  Multiple Vitamin (MULTIVITAMIN WITH MINERALS) TABS tablet Take 1 tablet by mouth daily.   Yes Historical Provider, MD  HYDROcodone-acetaminophen (NORCO/VICODIN) 5-325 MG per tablet Take 1 tablet by mouth every 6 (six) hours as needed for moderate pain or severe pain. 06/02/14   Ramisa Duman A Forcucci, PA-C   BP 149/99  Pulse 92  Temp(Src) 98.6 F (37 C) (Oral)  Resp 16  SpO2 100%  LMP 04/26/2014 Physical Exam  Nursing note and vitals reviewed. Constitutional: She is oriented to person, place, and time. She appears well-developed and well-nourished.  HENT:  Head: Normocephalic and atraumatic.  Mouth/Throat: Oropharynx is clear and moist. No oropharyngeal exudate.  Eyes: Conjunctivae are normal. Pupils are equal, round, and reactive to light. No scleral icterus.  Neck: Normal range of motion. Neck supple. No JVD present. No thyromegaly present.  Cardiovascular: Normal rate, regular rhythm, normal heart sounds and intact distal pulses.  Exam reveals no gallop and no friction rub.  No murmur heard. Pulses:      Radial pulses are 2+ on the right side, and 2+ on the left side.  Pulmonary/Chest: Effort normal and breath sounds normal. No respiratory distress. She has no wheezes. She has no rales. She exhibits no tenderness.  Musculoskeletal:       Right wrist: She exhibits decreased range of motion, tenderness and bony tenderness. She exhibits no swelling, no effusion, no crepitus, no deformity and no laceration.       Right hand: She exhibits decreased range of motion, tenderness and bony tenderness. She exhibits normal two-point discrimination, normal capillary refill, no deformity, no  laceration and no swelling. Normal sensation noted.  ROM and strength testing limited   Lymphadenopathy:    She has no cervical adenopathy.  Neurological: She is alert and oriented to person, place, and time.    ED Course  Procedures (including critical care time) Labs Review Labs Reviewed - No data to display  Imaging Review Dg Wrist Complete Right  06/02/2014   CLINICAL DATA:  Pain post injury  EXAM: RIGHT WRIST - COMPLETE 3+ VIEW  COMPARISON:  01/03/2009  FINDINGS: Four views of the right wrist submitted. No acute fracture or subluxation. There is mild narrowing of radiocarpal joint space. Mild negative ulnar variance.  IMPRESSION: No acute fracture or subluxation. Mild negative ulnar variance. Mild narrowing of radiocarpal joint space.   Electronically Signed   By: Natasha Mead M.D.   On: 06/02/2014 10:41   Dg Hand Complete Right  06/02/2014   CLINICAL DATA:  Pain post injury  EXAM: RIGHT HAND - COMPLETE 3+ VIEW  COMPARISON:  None.  FINDINGS: Three views of the right hand submitted. No acute fracture or subluxation. No radiopaque foreign body. No periosteal reaction or bony erosion. Mild narrowing of radiocarpal joint space.  IMPRESSION: Negative.   Electronically Signed   By: Natasha Mead M.D.   On: 06/02/2014 11:16     EKG Interpretation None      MDM   Final diagnoses:  Right wrist pain   Patient is a 38 y.o. Female with right wrist pain.  Physical exam reveals neurovascularly intact right wrist.  Plain film xrays reveal no acute fractures.  There is no evidence of septic joint or compartment syndrome.  Suspect contusion vs. Wrist sprain.  Will place the patient in a right cockup splint for comfort and will give hydrocodone 5/325 #10.  Patient to follow-up with [facility] wellness.  Patient to return for septic joint and compartment syndrome.  Patient stable for discharge at this time.  Patient states understanding and agreement at this time.      Eben Burow,  PA-C 06/02/14 1154

## 2014-06-02 NOTE — Discharge Instructions (Signed)

## 2014-06-07 NOTE — ED Provider Notes (Signed)
Medical screening examination/treatment/procedure(s) were performed by non-physician practitioner and as supervising physician I was immediately available for consultation/collaboration.  Toy Baker, MD 06/07/14 [phone]

## 2015-02-09 ENCOUNTER — Emergency Department (HOSPITAL_COMMUNITY)
Admission: EM | Admit: 2015-02-09 | Discharge: 2015-02-09 | Disposition: A | Discharge status: Home / Self Care | Payer: BLUE CROSS/BLUE SHIELD | Attending: Emergency Medicine | Admitting: Emergency Medicine

## 2015-02-09 ENCOUNTER — Encounter (HOSPITAL_COMMUNITY): Payer: Self-pay | Admitting: Emergency Medicine

## 2015-02-09 DIAGNOSIS — R1013 Epigastric pain: Secondary | ICD-10-CM | POA: Diagnosis not present

## 2015-02-09 DIAGNOSIS — Z3202 Encounter for pregnancy test, result negative: Secondary | ICD-10-CM | POA: Insufficient documentation

## 2015-02-09 DIAGNOSIS — I10 Essential (primary) hypertension: Secondary | ICD-10-CM | POA: Insufficient documentation

## 2015-02-09 DIAGNOSIS — Z720 Tobacco use: Secondary | ICD-10-CM | POA: Insufficient documentation

## 2015-02-09 DIAGNOSIS — R197 Diarrhea, unspecified: Secondary | ICD-10-CM | POA: Diagnosis not present

## 2015-02-09 DIAGNOSIS — Z79899 Other long term (current) drug therapy: Secondary | ICD-10-CM | POA: Insufficient documentation

## 2015-02-09 LAB — COMPREHENSIVE METABOLIC PANEL WITH GFR
ALT: 12 U/L — ABNORMAL LOW (ref 14–54)
AST: 22 U/L (ref 15–41)
Albumin: 4.3 g/dL (ref 3.5–5.0)
Alkaline Phosphatase: 37 U/L — ABNORMAL LOW (ref 38–126)
Anion gap: 9 (ref 5–15)
BUN: 9 mg/dL (ref 6–20)
CO2: 24 mmol/L (ref 22–32)
Calcium: 9 mg/dL (ref 8.9–10.3)
Chloride: 107 mmol/L (ref 101–111)
Creatinine, Ser: 0.83 mg/dL (ref 0.44–1.00)
GFR calc Af Amer: >60 mL/min (ref >60)
GFR calc non Af Amer: >60 mL/min (ref >60)
Glucose, Bld: 85 mg/dL (ref 65–99)
Potassium: 3.4 mmol/L — ABNORMAL LOW (ref 3.5–5.1)
Sodium: 140 mmol/L (ref 135–145)
Total Bilirubin: 0.4 mg/dL (ref 0.3–1.2)
Total Protein: 7.2 g/dL (ref 6.5–8.1)

## 2015-02-09 LAB — URINALYSIS, ROUTINE W REFLEX MICROSCOPIC
Bilirubin Urine: NEGATIVE
Glucose, UA: NEGATIVE mg/dL
Ketones, ur: 15 mg/dL — AB
Nitrite: NEGATIVE
Protein, ur: NEGATIVE mg/dL
Specific Gravity, Urine: 1.02 (ref 1.005–1.030)
Urobilinogen, UA: 0.2 mg/dL (ref 0.0–1.0)
pH: 6.5 (ref 5.0–8.0)

## 2015-02-09 LAB — CBC WITH DIFFERENTIAL/PLATELET
Basophils Absolute: 0 10*3/uL (ref 0.0–0.1)
Basophils Relative: 0 % (ref 0–1)
Eosinophils Absolute: 0.1 10*3/uL (ref 0.0–0.7)
Eosinophils Relative: 3 % (ref 0–5)
HCT: 33.3 % — ABNORMAL LOW (ref 36.0–46.0)
Hemoglobin: 11.2 g/dL — ABNORMAL LOW (ref 12.0–15.0)
Lymphocytes Relative: 33 % (ref 12–46)
Lymphs Abs: 1.6 10*3/uL (ref 0.7–4.0)
MCH: 30.2 pg (ref 26.0–34.0)
MCHC: 33.6 g/dL (ref 30.0–36.0)
MCV: 89.8 fL (ref 78.0–100.0)
Monocytes Absolute: 0.3 10*3/uL (ref 0.1–1.0)
Monocytes Relative: 7 % (ref 3–12)
Neutro Abs: 2.9 10*3/uL (ref 1.7–7.7)
Neutrophils Relative %: 57 % (ref 43–77)
Platelets: 290 10*3/uL (ref 150–400)
RBC: 3.71 MIL/uL — ABNORMAL LOW (ref 3.87–5.11)
RDW: 13.9 % (ref 11.5–15.5)
WBC: 5 10*3/uL (ref 4.0–10.5)

## 2015-02-09 LAB — I-STAT BETA HCG BLOOD, ED (MC, WL, AP ONLY): I-stat hCG, quantitative: <5 m[IU]/mL (ref <5)

## 2015-02-09 LAB — URINE MICROSCOPIC-ADD ON

## 2015-02-09 LAB — LIPASE, BLOOD: Lipase: 14 U/L — ABNORMAL LOW (ref 22–51)

## 2015-02-09 MED ORDER — ONDANSETRON HCL 4 MG/2ML IJ SOLN
4.0000 mg | Freq: Once | INTRAMUSCULAR | Status: AC
  Administered 2015-02-09: 4 mg via INTRAVENOUS
  Filled 2015-02-09: qty 2

## 2015-02-09 MED ORDER — MORPHINE SULFATE 4 MG/ML IJ SOLN
4.0000 mg | Freq: Once | INTRAMUSCULAR | Status: AC
  Administered 2015-02-09: 4 mg via INTRAVENOUS
  Filled 2015-02-09: qty 1

## 2015-02-09 NOTE — ED Provider Notes (Signed)
CSN: [id number]     Arrival date & time 02/09/15  0941 History   First MD Initiated Contact with Patient 02/09/15 [phone]     Chief Complaint  Patient presents with  . Abdominal Pain   ) Patient is a 39 y.o. female presenting with abdominal pain. The history is provided by the patient.  Abdominal Pain Pain location:  Epigastric Pain quality: sharp   Pain radiates to:  Does not radiate Pain severity:  Severe Onset quality:  Sudden Duration:  4 days Timing:  Constant Progression:  Worsening Chronicity:  Recurrent Relieved by:  Nothing Worsened by:  Movement and palpation Associated symptoms: diarrhea   Associated symptoms: no chest pain, no dysuria, no fever, no hematochezia, no vaginal bleeding and no vomiting   Pt reports onset of epigastric abd pain 4 days ago Reports it feels "like previous hernia" She reports surgery for similar illness a year ago  Past Medical History  Diagnosis Date  . Migraine   . Hypertension    Past Surgical History  Procedure Laterality Date  . Cesarean section     Family History  Problem Relation Age of Onset  . Hypertension Mother   . Cancer Father 4256    colon   History  Substance Use Topics  . Smoking status: Current Every Day Smoker -- 0.50 packs/day    Types: Cigarettes  . Smokeless tobacco: Not on file  . Alcohol Use: No   OB History    No data available     Review of Systems  Constitutional: Negative for fever.  Cardiovascular: Negative for chest pain.  Gastrointestinal: Positive for abdominal pain and diarrhea. Negative for vomiting and hematochezia.  Genitourinary: Negative for dysuria and vaginal bleeding.  All other systems reviewed and are negative.     Allergies  Aspirin and Ibuprofen  Home Medications   Prior to Admission medications   Medication Sig Start Date End Date Taking? Authorizing Provider  acetaminophen (TYLENOL) 325 MG tablet Take 650 mg by mouth every 6 (six) hours as needed for mild pain (pain).     Historical Provider, MD  HYDROcodone-acetaminophen (NORCO/VICODIN) 5-325 MG per tablet Take 1 tablet by mouth every 6 (six) hours as needed for moderate pain (pain).    Historical Provider, MD  HYDROcodone-acetaminophen (NORCO/VICODIN) 5-325 MG per tablet Take 1 tablet by mouth every 6 (six) hours as needed for moderate pain or severe pain. 06/02/14   Courtney Forcucci, PA-C  Multiple Vitamin (MULTIVITAMIN WITH MINERALS) TABS tablet Take 1 tablet by mouth daily.    Historical Provider, MD   BP 142/94 mmHg  Pulse 96  Temp(Src) 99 F (37.2 C) (Oral)  Resp 17  Ht 5\' 2"  (1.575 m)  Wt 135 lb (61.236 kg)  BMI 24.69 kg/m2  SpO2 100%  LMP 01/19/2015 Physical Exam CONSTITUTIONAL: Well developed/well nourished HEAD: Normocephalic/atraumatic EYES: EOMI/PERRL ENMT: Mucous membranes moist NECK: supple no meningeal signs SPINE/BACK:entire spine nontender CV: S1/S2 noted, no murmurs/rubs/gallops noted LUNGS: Lungs are clear to auscultation bilaterally, no apparent distress ABDOMEN: soft, moderate epigastric tenderness, no palpable hernia, well healed surgical scar noted, no rebound or guarding, bowel sounds noted throughout abdomen GU:no cva tenderness NEURO: Pt is awake/alert/appropriate, moves all extremitiesx4.  No facial droop.   EXTREMITIES: pulses normal/equal, full ROM SKIN: warm, color normal PSYCH: no abnormalities of mood noted, alert and oriented to situation  ED Course  Procedures  10:15 AM Pt with significant epigastric abd tenderness on exam She appears uncomfortable She is s/p primary ventral hernia repair  11:34 AM Labs reassuring Pt improved No hernia Minimal epigastric tenderness Pt admits she has this pain about 3-4x/month but this was worse No signs of hernia on exam No signs of bowel obstruction I have low suspicion for biliary disease 1:51 PM Pt well appearing and watching TV Labs reassuring She is taking PO I feel she can be discharged with close gen surgery  evaluation  Labs Review Labs Reviewed  COMPREHENSIVE METABOLIC PANEL - Abnormal; Notable for the following:    Potassium 3.4 (*)    ALT 12 (*)    Alkaline Phosphatase 37 (*)    All other components within normal limits  CBC WITH DIFFERENTIAL/PLATELET - Abnormal; Notable for the following:    RBC 3.71 (*)    Hemoglobin 11.2 (*)    HCT 33.3 (*)    All other components within normal limits  LIPASE, BLOOD - Abnormal; Notable for the following:    Lipase 14 (*)    All other components within normal limits  URINALYSIS, ROUTINE W REFLEX MICROSCOPIC (NOT AT [facility]) - Abnormal; Notable for the following:    Hgb urine dipstick SMALL (*)    Ketones, ur 15 (*)    Leukocytes, UA SMALL (*)    All other components within normal limits  URINE MICROSCOPIC-ADD ON - Abnormal; Notable for the following:    Squamous Epithelial / LPF MANY (*)    Bacteria, UA FEW (*)    Casts GRANULAR CAST (*)    All other components within normal limits  I-STAT BETA HCG BLOOD, ED ([facility], [facility], [facility] ONLY)    Medications  morphine 4 MG/ML injection 4 mg (4 mg Intravenous Given 02/09/15 1019)  ondansetron (ZOFRAN) injection 4 mg (4 mg Intravenous Given 02/09/15 1019)    MDM   Final diagnoses:  Epigastric pain    Nursing notes including past medical history and social history reviewed and considered in documentation Labs/vital reviewed myself and considered during evaluation Previous records reviewed and considered     Zadie Rhine, MD 02/09/15 1352

## 2015-02-09 NOTE — ED Notes (Signed)
Pt c/o abd pain that has been worse x 4 days. Last year pt had surgery on hiatal hernia and stated that its the same pain and area as before surgery. Pt has 2 episodes of diarrhea this morning and denies any bloody or black stool. Denies any vomiting. Pt stated that she is unable to eat because the pain gets worse.

## 2015-02-09 NOTE — Discharge Instructions (Signed)

## 2015-07-19 ENCOUNTER — Encounter: Payer: Self-pay | Admitting: Internal Medicine

## 2015-08-20 IMAGING — US US TRANSVAGINAL NON-OB
1 series · 14 of 25 positions shown · non-contrast
Comparison: CT scan [DATE]

CLINICAL DATA: Pain, passing clots, and cramping for 3 day

EXAM:
TRANSABDOMINAL AND TRANSVAGINAL ULTRASOUND OF PELVIS
TECHNIQUE: Both transabdominal and transvaginal ultrasound examinations of the
pelvis were performed. Transabdominal technique was performed for
global imaging of the pelvis including uterus, ovaries, adnexal
regions, and pelvic cul-de-sac. It was necessary to proceed with
endovaginal exam following the transabdominal exam to visualize the
uterus and ovaries.

[Series 1: us transvaginal non-ob · 0.14mm/px · 14 of 82 slices shown]
[im 1/82]
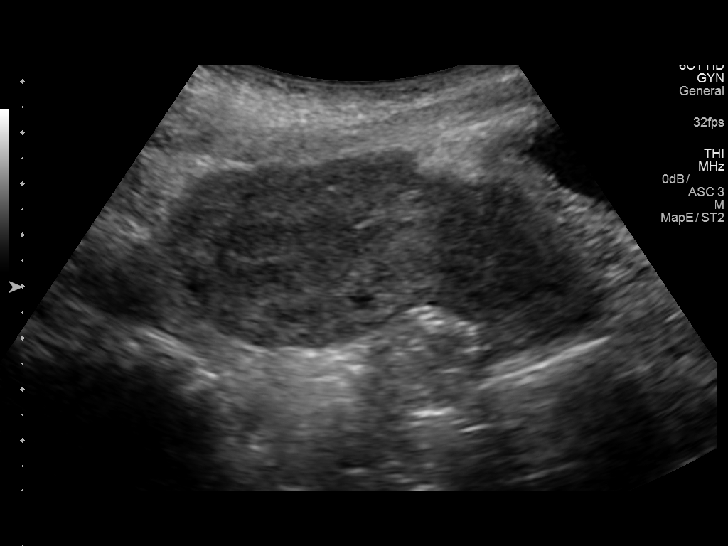
[im 7/82]
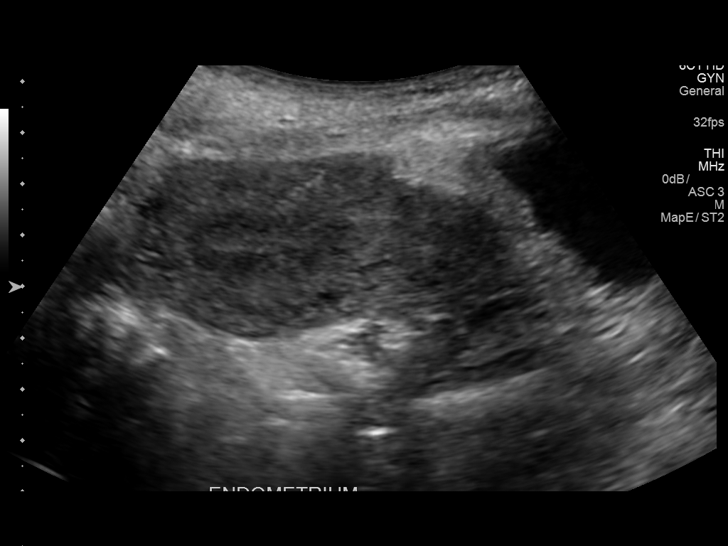
[im 14/82]
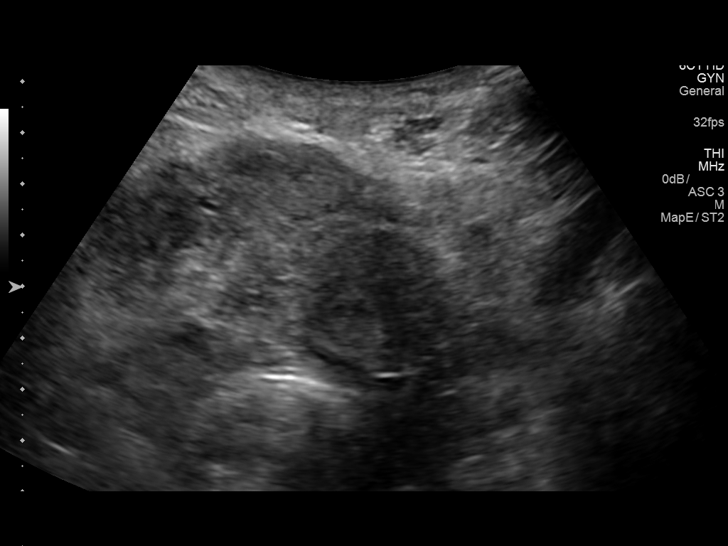
[im 21/82]
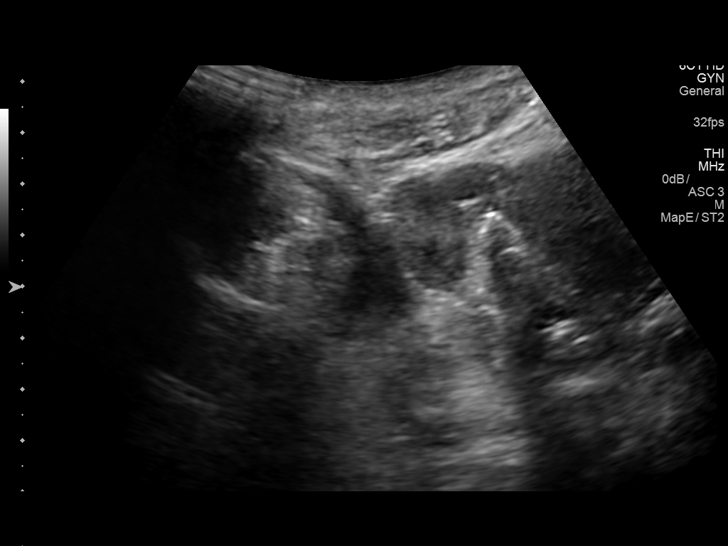
[im 28/82]
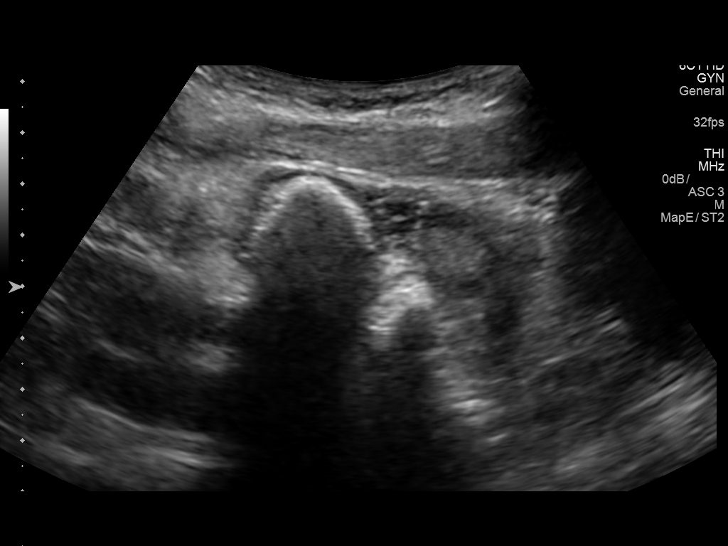
[im 31/82]
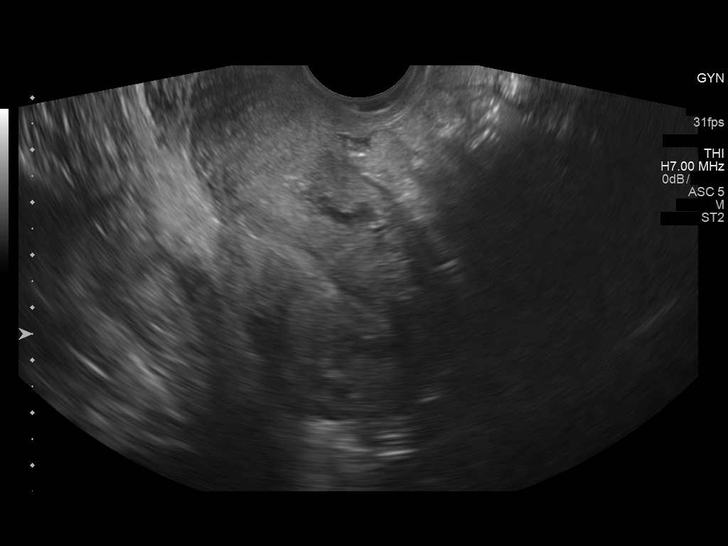
[im 38/82]
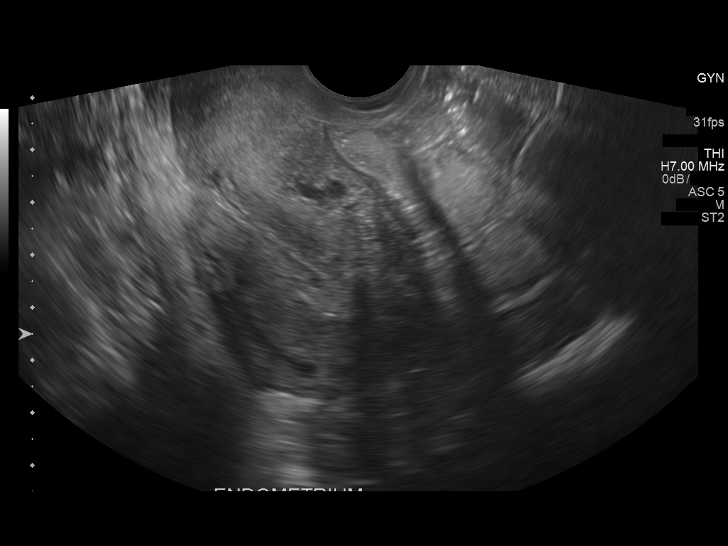
[im 44/82]
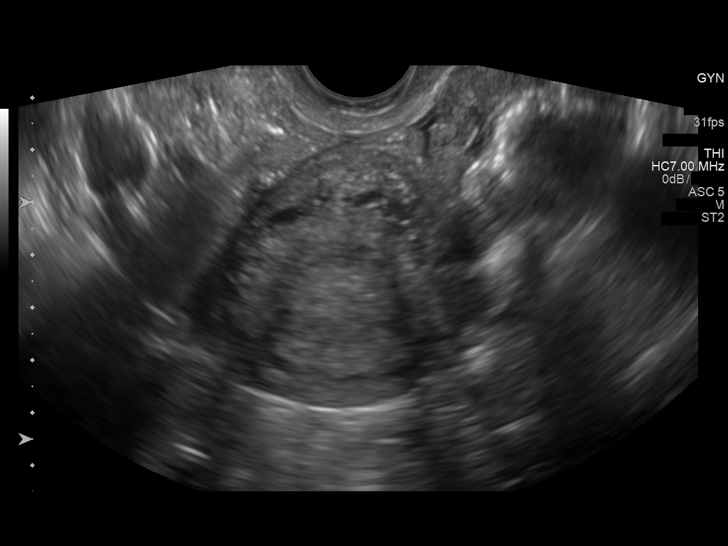
[im 51/82]
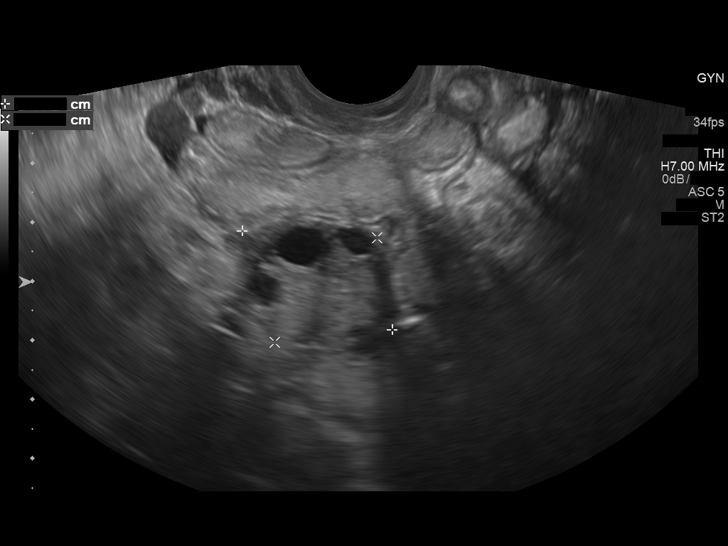
[im 55/82]
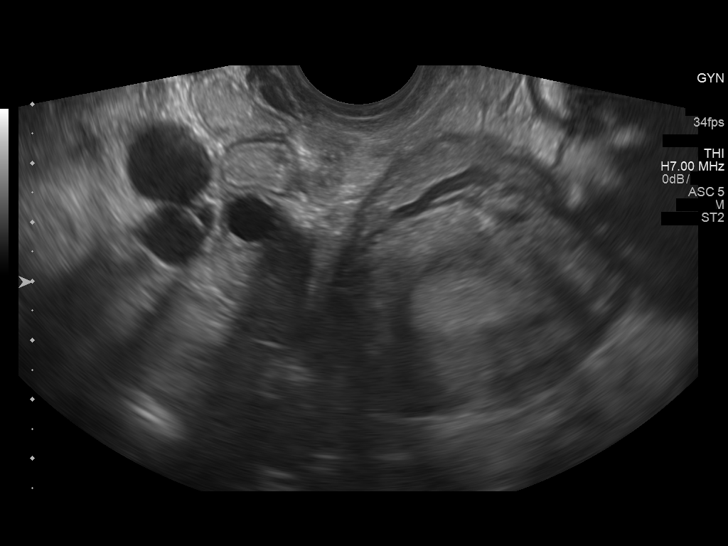
[im 61/82]
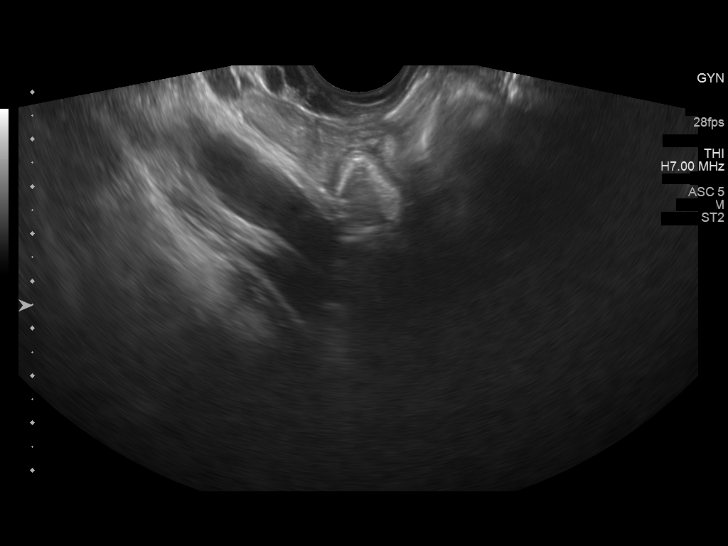
[im 68/82]
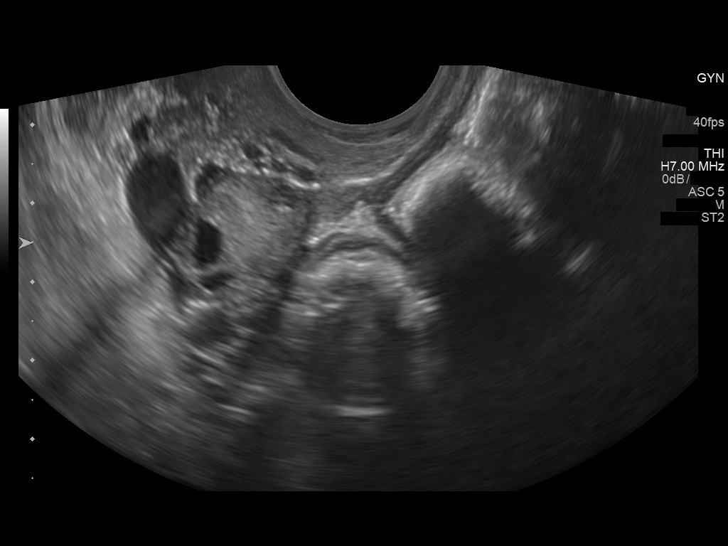
[im 75/82]
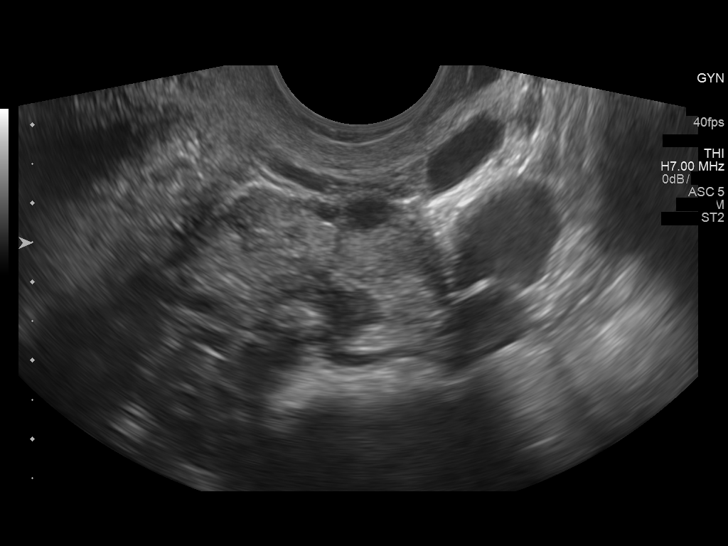
[im 82/82]
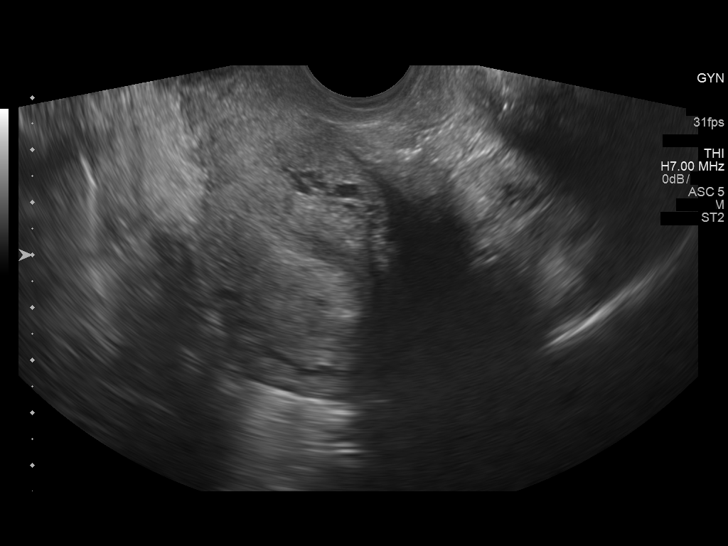

[14 of 25 positions shown; findings below may reference images not displayed]

FINDINGS: Uterus

Measurements: 7.1 x 4.0 x 4.7 cm.. No fibroids or other mass
visualized.

Endometrium

Thickness: 7.1 mm.  No focal abnormality visualized.

Right ovary

Measurements: 3.0 x 2.5 x 1.8 cm.. Normal appearance/no adnexal
mass.

Left ovary

Measurements: 2.5 x 1.7 x 2.7 cm.. Normal appearance/no adnexal
mass.

Other findings

No abnormal free fluid.
IMPRESSION: No acute abnormalities are identified.

## 2015-09-19 ENCOUNTER — Ambulatory Visit: Payer: BLUE CROSS/BLUE SHIELD | Admitting: Gastroenterology

## 2015-09-22 ENCOUNTER — Ambulatory Visit: Payer: BLUE CROSS/BLUE SHIELD | Admitting: Internal Medicine

## 2015-10-17 ENCOUNTER — Encounter (HOSPITAL_COMMUNITY): Payer: Self-pay

## 2015-10-17 ENCOUNTER — Emergency Department (HOSPITAL_COMMUNITY)
Admission: EM | Admit: 2015-10-17 | Discharge: 2015-10-17 | Disposition: A | Discharge status: Home / Self Care | Payer: BLUE CROSS/BLUE SHIELD | Source: Home / Self Care

## 2015-10-17 ENCOUNTER — Emergency Department (HOSPITAL_COMMUNITY)
Admission: EM | Admit: 2015-10-17 | Discharge: 2015-10-17 | Disposition: A | Discharge status: Home / Self Care | Payer: BLUE CROSS/BLUE SHIELD | Attending: Emergency Medicine | Admitting: Emergency Medicine

## 2015-10-17 DIAGNOSIS — N390 Urinary tract infection, site not specified: Secondary | ICD-10-CM | POA: Diagnosis not present

## 2015-10-17 DIAGNOSIS — R112 Nausea with vomiting, unspecified: Secondary | ICD-10-CM | POA: Insufficient documentation

## 2015-10-17 DIAGNOSIS — F17210 Nicotine dependence, cigarettes, uncomplicated: Secondary | ICD-10-CM | POA: Diagnosis not present

## 2015-10-17 DIAGNOSIS — Z9889 Other specified postprocedural states: Secondary | ICD-10-CM | POA: Insufficient documentation

## 2015-10-17 DIAGNOSIS — I10 Essential (primary) hypertension: Secondary | ICD-10-CM | POA: Insufficient documentation

## 2015-10-17 DIAGNOSIS — R1013 Epigastric pain: Secondary | ICD-10-CM

## 2015-10-17 DIAGNOSIS — Z3202 Encounter for pregnancy test, result negative: Secondary | ICD-10-CM | POA: Insufficient documentation

## 2015-10-17 LAB — COMPREHENSIVE METABOLIC PANEL
ALT: 13 U/L — ABNORMAL LOW (ref 14–54)
AST: 17 U/L (ref 15–41)
Albumin: 5.1 g/dL — ABNORMAL HIGH (ref 3.5–5.0)
Alkaline Phosphatase: 47 U/L (ref 38–126)
Anion gap: 13 (ref 5–15)
BUN: 12 mg/dL (ref 6–20)
CO2: 19 mmol/L — ABNORMAL LOW (ref 22–32)
Calcium: 10.1 mg/dL (ref 8.9–10.3)
Chloride: 110 mmol/L (ref 101–111)
Creatinine, Ser: 0.7 mg/dL (ref 0.44–1.00)
GFR calc Af Amer: >60 mL/min (ref >60)
GFR calc non Af Amer: >60 mL/min (ref >60)
Glucose, Bld: 102 mg/dL — ABNORMAL HIGH (ref 65–99)
Potassium: 2.8 mmol/L — ABNORMAL LOW (ref 3.5–5.1)
Sodium: 142 mmol/L (ref 135–145)
Total Bilirubin: 0.4 mg/dL (ref 0.3–1.2)
Total Protein: 8.7 g/dL — ABNORMAL HIGH (ref 6.5–8.1)

## 2015-10-17 LAB — URINALYSIS, ROUTINE W REFLEX MICROSCOPIC
Bilirubin Urine: NEGATIVE
Glucose, UA: NEGATIVE mg/dL
Ketones, ur: 15 mg/dL — AB
Nitrite: NEGATIVE
Protein, ur: NEGATIVE mg/dL
Specific Gravity, Urine: 1.019 (ref 1.005–1.030)
pH: 7 (ref 5.0–8.0)

## 2015-10-17 LAB — CBC
HCT: 28.8 % — ABNORMAL LOW (ref 36.0–46.0)
Hemoglobin: 9.6 g/dL — ABNORMAL LOW (ref 12.0–15.0)
MCH: 28.8 pg (ref 26.0–34.0)
MCHC: 33.3 g/dL (ref 30.0–36.0)
MCV: 86.5 fL (ref 78.0–100.0)
Platelets: 410 10*3/uL — ABNORMAL HIGH (ref 150–400)
RBC: 3.33 MIL/uL — ABNORMAL LOW (ref 3.87–5.11)
RDW: 14.1 % (ref 11.5–15.5)
WBC: 4.8 10*3/uL (ref 4.0–10.5)

## 2015-10-17 LAB — URINE MICROSCOPIC-ADD ON

## 2015-10-17 LAB — I-STAT BETA HCG BLOOD, ED (MC, WL, AP ONLY): I-stat hCG, quantitative: <5 m[IU]/mL (ref <5)

## 2015-10-17 LAB — LIPASE, BLOOD: Lipase: 19 U/L (ref 11–51)

## 2015-10-17 LAB — POC OCCULT BLOOD, ED: Fecal Occult Bld: POSITIVE — AB

## 2015-10-17 MED ORDER — POTASSIUM CHLORIDE CRYS ER 20 MEQ PO TBCR
40.0000 meq | EXTENDED_RELEASE_TABLET | Freq: Two times a day (BID) | ORAL | Status: DC
  Administered 2015-10-17: 40 meq via ORAL
  Filled 2015-10-17: qty 2

## 2015-10-17 MED ORDER — MORPHINE SULFATE (PF) 4 MG/ML IV SOLN
4.0000 mg | Freq: Once | INTRAVENOUS | Status: AC
  Administered 2015-10-17: 4 mg via INTRAVENOUS
  Filled 2015-10-17: qty 1

## 2015-10-17 MED ORDER — POTASSIUM CHLORIDE 10 MEQ/100ML IV SOLN: 10.0000 meq | Freq: Once | INTRAVENOUS | Status: DC

## 2015-10-17 MED ORDER — OMEPRAZOLE 20 MG PO CPDR: 20.0000 mg | DELAYED_RELEASE_CAPSULE | Freq: Every day | ORAL | Status: DC

## 2015-10-17 MED ORDER — ONDANSETRON HCL 4 MG/2ML IJ SOLN
4.0000 mg | Freq: Once | INTRAMUSCULAR | Status: AC
  Administered 2015-10-17: 4 mg via INTRAVENOUS
  Filled 2015-10-17: qty 2

## 2015-10-17 MED ORDER — SODIUM CHLORIDE 0.9 % IV BOLUS (SEPSIS)
1000.0000 mL | Freq: Once | INTRAVENOUS | Status: AC
  Administered 2015-10-17: 1000 mL via INTRAVENOUS

## 2015-10-17 MED ORDER — PANTOPRAZOLE SODIUM 40 MG IV SOLR
40.0000 mg | Freq: Once | INTRAVENOUS | Status: AC
  Administered 2015-10-17: 40 mg via INTRAVENOUS
  Filled 2015-10-17: qty 40

## 2015-10-17 MED ORDER — CEPHALEXIN 500 MG PO CAPS: 500.0000 mg | ORAL_CAPSULE | Freq: Two times a day (BID) | ORAL | Status: DC

## 2015-10-17 NOTE — Discharge Instructions (Signed)
Call [facility] and Wellness or a PCP from the resource guide to establish primary care. Restart your iron pills. Your hemoglobin was low and will need to be rechecked by your PCP. Follow up with [facility] for your chronic abdominal pain. Take omeprazole daily as prescribed.    Abdominal Pain, Adult Many things can cause abdominal pain. Usually, abdominal pain is not caused by a disease and will improve without treatment. It can often be observed and treated at home. Your health care provider will do a physical exam and possibly order blood tests and X-rays to help determine the seriousness of your pain. However, in many cases, more time must pass before a clear cause of the pain can be found. Before that point, your health care provider may not know if you need more testing or further treatment. HOME CARE INSTRUCTIONS Monitor your abdominal pain for any changes. The following actions may help to alleviate any discomfort you are experiencing:  Only take over-the-counter or prescription medicines as directed by your health care provider.  Do not take laxatives unless directed to do so by your health care provider.  Try a clear liquid diet (broth, tea, or water) as directed by your health care provider. Slowly move to a bland diet as tolerated. SEEK MEDICAL CARE IF:  You have unexplained abdominal pain.  You have abdominal pain associated with nausea or diarrhea.  You have pain when you urinate or have a bowel movement.  You experience abdominal pain that wakes you in the night.  You have abdominal pain that is worsened or improved by eating food.  You have abdominal pain that is worsened with eating fatty foods.  You have a fever. SEEK IMMEDIATE MEDICAL CARE IF:  Your pain does not go away within 2 hours.  You keep throwing up (vomiting).  Your pain is felt only in portions of the abdomen, such as the right side or the left lower portion of the abdomen.  You pass  bloody or black tarry stools. MAKE SURE YOU:  Understand these instructions.  Will watch your condition.  Will get help right away if you are not doing well or get worse.   This information is not intended to replace advice given to you by your health care provider. Make sure you discuss any questions you have with your health care provider.   Document Released: 06/06/2005 Document Revised: 05/18/2015 Document Reviewed: 05/06/2013 Elsevier Interactive Patient Education 2016 Elsevier Inc.  Urinary Tract Infection Urinary tract infections (UTIs) can develop anywhere along your urinary tract. Your urinary tract is your body's drainage system for removing wastes and extra water. Your urinary tract includes two kidneys, two ureters, a bladder, and a urethra. Your kidneys are a pair of bean-shaped organs. Each kidney is about the size of your fist. They are located below your ribs, one on each side of your spine. CAUSES Infections are caused by microbes, which are microscopic organisms, including fungi, viruses, and bacteria. These organisms are so small that they can only be seen through a microscope. Bacteria are the microbes that most commonly cause UTIs. SYMPTOMS  Symptoms of UTIs may vary by age and gender of the patient and by the location of the infection. Symptoms in young women typically include a frequent and intense urge to urinate and a painful, burning feeling in the bladder or urethra during urination. Older women and men are more likely to be tired, shaky, and weak and have muscle aches and abdominal pain. A fever  may mean the infection is in your kidneys. Other symptoms of a kidney infection include pain in your back or sides below the ribs, nausea, and vomiting. DIAGNOSIS To diagnose a UTI, your caregiver will ask you about your symptoms. Your caregiver will also ask you to provide a urine sample. The urine sample will be tested for bacteria and white blood cells. White blood cells  are made by your body to help fight infection. TREATMENT  Typically, UTIs can be treated with medication. Because most UTIs are caused by a bacterial infection, they usually can be treated with the use of antibiotics. The choice of antibiotic and length of treatment depend on your symptoms and the type of bacteria causing your infection. HOME CARE INSTRUCTIONS  If you were prescribed antibiotics, take them exactly as your caregiver instructs you. Finish the medication even if you feel better after you have only taken some of the medication.  Drink enough water and fluids to keep your urine clear or pale yellow.  Avoid caffeine, tea, and carbonated beverages. They tend to irritate your bladder.  Empty your bladder often. Avoid holding urine for long periods of time.  Empty your bladder before and after sexual intercourse.  After a bowel movement, women should cleanse from front to back. Use each tissue only once. SEEK MEDICAL CARE IF:   You have back pain.  You develop a fever.  Your symptoms do not begin to resolve within 3 days. SEEK IMMEDIATE MEDICAL CARE IF:   You have severe back pain or lower abdominal pain.  You develop chills.  You have nausea or vomiting.  You have continued burning or discomfort with urination. MAKE SURE YOU:   Understand these instructions.  Will watch your condition.  Will get help right away if you are not doing well or get worse.   This information is not intended to replace advice given to you by your health care provider. Make sure you discuss any questions you have with your health care provider.   Document Released: 06/06/2005 Document Revised: 05/18/2015 Document Reviewed: 10/05/2011 Elsevier Interactive Patient Education 2016 [employer].   Emergency Department Resource Guide 1) Find a Doctor and Pay Out of Pocket Although you won't have to find out who is covered by your insurance plan, it is a good idea to ask around and get  recommendations. You will then need to call the office and see if the doctor you have chosen will accept you as a new patient and what types of options they offer for patients who are self-pay. Some doctors offer discounts or will set up payment plans for their patients who do not have insurance, but you will need to ask so you aren't surprised when you get to your appointment.  2) Contact Your Local Health Department Not all health departments have doctors that can see patients for sick visits, but many do, so it is worth a call to see if yours does. If you don't know where your local health department is, you can check in your phone book. The CDC also has a tool to help you locate your state's health department, and many state websites also have listings of all of their local health departments.  3) Find a Walk-in Clinic If your illness is not likely to be very severe or complicated, you may want to try a walk in clinic. These are popping up all over the country in pharmacies, drugstores, and shopping centers. They're usually staffed by nurse practitioners or physician  assistants that have been trained to treat common illnesses and complaints. They're usually fairly quick and inexpensive. However, if you have serious medical issues or chronic medical problems, these are probably not your best option.  No Primary Care Doctor: - Call Health Connect at  [phone] - they can help you locate a primary care doctor that  accepts your insurance, provides certain services, etc. - Physician Referral Service- [phone]  Chronic Pain Problems: Organization         Address  Phone   Notes  [facility]  [phone] Patients need to be referred by their primary care doctor.   Medication Assistance: Organization         Address  Phone   Notes  [facility] Medication [facility] [address] [place] [place], [place] [address] [phone] --Must be a resident of  [facility] -- Must have NO insurance coverage whatsoever (no Medicaid/ Medicare, etc.) -- The pt. MUST have a primary care doctor that directs their care regularly and follows them in the community   [facility]  [phone]   [employer]  [phone]    Agencies that provide inexpensive medical care: Organization         Address  Phone   Notes  [facility]  [phone]   [facility]    [phone]   [facility] [address] [place], [place] [address] [phone]   [facility] [address], [place] [phone]   Planned Parenthood    [phone]   [facility]    [phone]   Community [facility] and [facility]  [address], [place] Phone:  [phone], Fax:  [phone] Hours of Operation:  9 am - 6 pm, M-F.  Also accepts Medicaid/Medicare and self-pay.  [facility]  [address], [place] Phone: [phone], Fax: [phone]. Hours of Operation:  8:30 am - 5:30 pm, M-F.  Also accepts Medicaid and self-pay.  [facility] [place] [address], [place] Phone: [phone]   [facility] [address] Natasha Bence [place], [place] [phone], Ext. [phone] Mondays & Thursdays: 7-9 AM.  First 15 patients are seen on a first come, first serve basis.    Medicaid-accepting [facility] Providers:  Organization         Address  Phone   Notes  [facility] [address], [place] [phone] Also accepts self-pay patients.  [facility] [address] Laurell Josephs [place], [place]  [phone]   [facility] [address], [place] [phone]   [facility] [address], [place] [phone]   Renaye Rakers [address], [place]   [phone] Only accepts [place] Access  [place] patients after they have their name applied to their card.   Self-Pay (no insurance) in [facility]:  Organization         Address  Phone   Notes  Sickle Cell Patients, [facility] Medicine [address] [place], [place] [phone]   [facility] Urgent Care [address] [place], [place] [phone]   [facility] [place]  [address], [place] [phone]   [facility]/Dr. Julio Sicks  [address] [place]  [address], [place] or [address], [place] [phone] Phone number for both [place] and [place] locations is the same.  Urgent Medical and [facility] [address], [place] [phone]   [facility] [address] [place] or [address] [phone]   [facility] [address] [place] [phone], phone; [phone], fax Sees patients 1st and 3rd Saturday of every month.  Must not qualify for public or private insurance (i.e. Medicaid, Medicare, [place] Health Choice, Veterans' Benefits)  Household income should be no more than 200% of the poverty level The clinic cannot treat you if you are pregnant or think you are pregnant  Sexually transmitted diseases are not treated at the clinic.    Dental Care: Organization         Address  Phone  Notes  [facility] of [facility] [address] [place], [place] [phone] Accepts children up to age 50 who are enrolled in [place] or [place] Health Choice; pregnant women with a Medicaid card; and children who have applied for Medicaid or [place] Health Choice, but were declined, whose parents can pay a reduced fee at time of service.  [facility] of [facility]  [address], [place] [phone] Accepts children up to age 52 who are enrolled in [place] or [place] Health Choice; pregnant women with a Medicaid  card; and children who have applied for Medicaid or [place] Health Choice, but were declined, whose parents can pay a reduced fee at time of service.  [facility]  [address] [place], [place] [phone] Patients are seen by appointment only. Walk-ins are not accepted. [facility] will see patients 30 years of age and older. Monday - Tuesday (8am-5pm) Most Wednesdays (8:30-5pm) $30 per visit, cash only  [facility] Adult Dental Access PROGRAM  [address], [facility] [phone] Patients are seen by appointment only. Walk-ins are not accepted. [facility] will see patients 57 years of age and older. One Wednesday Evening (Monthly: Volunteer Based).  $30 per visit, cash only  [employer]  [phone] for adults; Children under age 54, call Graduate Pediatric Dentistry at [phone]. Children aged 20-14, please call [phone] to request a pediatric application.  Dental services are provided in all areas of dental care including fillings, crowns and bridges, complete and partial dentures, implants, gum treatment, root canals, and extractions. Preventive care is also provided. Treatment is provided to both adults and children. Patients are selected via a lottery and there is often a waiting list.   [facility] [address] [place]  [phone] [email or web address]   [facility] [address] [place], [place] [phone], Ext. [phone] Second and Fourth Thursday of each month, opens at 6:30 AM; Clinic ends at 9 AM.  Patients are seen on a first-come first-served basis, and a limited number are seen during each clinic.   [facility]  [address] Ether Griffins [place], [place] [phone]   Eligibility Requirements You must have lived in [place], [place], or [place] counties for at least the last three months.   You cannot be eligible for state or federal sponsored [place],  including [employer], [place], or [employer].   You generally cannot be eligible for healthcare insurance through your employer.    How to  apply: Eligibility screenings are held every Tuesday and Wednesday afternoon from 1:00 pm until 4:00 pm. You do not need an appointment for the interview!  [facility] [address], [place], [place] [phone]   [facility] Health Department  [phone]   [facility] Health Department  [phone]   [facility] Health Department  [phone]    Behavioral Health Resources in the Community: Intensive Outpatient Programs Organization         Address  Phone  Notes  [facility] [address], [place], [place] [phone]   [facility] Outpatient [address] [place], [place] [phone]   [facility] [address] [place], [place]  [phone]   [facility] Mental Health [address]  [place], [place] [phone] or [phone]   Substance Abuse Resources Organization         Address  Phone  Notes  [facility] Drug Services  [phone]   [facility]  [phone]   [facility] [place]  [phone]   Floydene Flock  [phone]   Residential & Outpatient Substance Abuse Program  [phone]   Psychological Services Organization         Address  Phone  Notes  [facility]  [phone]   [facility]  [phone]   [facility] Mental Health [address] [place] [phone] or [phone]    Mobile Crisis Teams Organization         Address  Phone  Notes  [facility], Mobile Crisis Care Unit  [phone]   [facility]  [address] [place], [place] [phone]   Doristine Locks [address] [place] [phone]    Self-Help/Support Groups Organization         Address  Phone             Notes  Mental Health Assoc.  of  - variety of support groups  336- I7437963 Call for more information  Narcotics Anonymous (NA), [facility] [address], [employer] [place]  2 meetings at this location   Statistician         Address  Phone  Notes  [facility] [address],    [place]  [phone]   [facility]  [address] [place] [address], [place], [place] [phone]   [facility] Treatment Facility [address] [place], [place] [phone] Admissions: 8am-3pm M-F  [facility] [address]    [place], [place] [phone]   [facility] [address] [place], [place], [place] [phone]   [facility] [address]  [place], [place] [phone]   [facility] - Intensive Outpatient [address] Laurell Joseph[address], [place], [place] [phone]   [facility] ([facility] [address] [place]  [place], [place] [phone] or [phone]   [facility] ([facility]) [address] [place], [place] [phone] Accepts Medicaid  [facility] [address],  [place] [phone] Substance Abuse/Addiction Treatment   [facility] Organization         Address  Phone  Notes  [facility]  [phone]   Angie Fava, PhD [address] Ervin Knack [place], [place]   [phone] or [phone]   [facility] Behavioral   [address] [place], [place] [phone]   [facility] [address], [place], [place] [phone]  [phone] F9272065 Insurance/Medicaid/sponsorship through [facility] [address]                                    [place], [place] [phone] Therapy/tele-psych/case  [facility] [address]   [place], [place] [phone]    Dr. Lolly Mustache  [phone]   [facility]  [employer] [facility] Dept. 1) [address], [place] 2)  [address] [place] 3)  [address], [place] [phone]  [phone]   [facility] Child Abuse Hotline 830-454-3932 or 8645338458 (After Hours)

## 2015-10-17 NOTE — ED Notes (Signed)
Pt is aware of urine sample. Info me she still in pain at this time

## 2015-10-17 NOTE — ED Provider Notes (Signed)
CSN: [id number]     Arrival date & time 10/17/15  1237 History   First MD Initiated Contact with Patient 10/17/15 1249     Chief Complaint  Patient presents with  . Abdominal Pain   HPI  [name] is a 40 year old female with PMHx of HTN and migraine presenting with abdominal pain. Onset of symptoms was 3 days ago. The pain is located in the epigastric region and described as burning. The pain is exacerbated by eating. She also endorses associated nausea and vomiting after eating. She states that her vomit tastes "really bad" and makes her stomach burn worse. She reports frequent episodes of this abdominal pain. She estimates that she has about 2 episodes per month for the past 2-3 years. She is unsure what triggers the episodes. She has never followed with a gastroenterologist for these complaints. She was previously on omeprazole for her stomach pain but discontinued this because "I heard there was a recall on it". Denies blood in her vomit or stool. Denies associated fevers, chills, headaches, dizziness, syncope, URI symptoms, chest pain, SOB, cough, diarrhea, dysuria, flank pain or weakness.   Past Medical History  Diagnosis Date  . Migraine   . Hypertension    Past Surgical History  Procedure Laterality Date  . Cesarean section      x 4  . Abdominal hernia repair     Family History  Problem Relation Age of Onset  . Hypertension Mother   . Colon cancer Father 23   Social History  Substance Use Topics  . Smoking status: Current Every Day Smoker -- 0.50 packs/day    Types: Cigarettes  . Smokeless tobacco: None  . Alcohol Use: No   OB History    No data available     Review of Systems  Gastrointestinal: Positive for nausea, vomiting and abdominal pain.  All other systems reviewed and are negative.     Allergies  Aspirin and Ibuprofen  Home Medications   Prior to Admission medications   Medication Sig Start Date End Date Taking? Authorizing Provider  cephALEXin  (KEFLEX) 500 MG capsule Take 1 capsule (500 mg total) by mouth 2 (two) times daily. 10/17/15   Enmanuel Zufall, PA-C  diphenhydrAMINE (BENADRYL) 25 MG tablet Take 25 mg by mouth every 6 (six) hours as needed for allergies.    Historical Provider, MD  omeprazole (PRILOSEC) 20 MG capsule Take 1 capsule (20 mg total) by mouth daily. 10/17/15   Latif Nazareno, PA-C   BP 145/101 mmHg  Pulse 80  Temp(Src) 98.6 F (37 C) (Oral)  Resp 18  SpO2 100%  LMP 10/03/2015 Physical Exam  Constitutional: She appears well-developed and well-nourished. No distress.  HENT:  Head: Normocephalic and atraumatic.  Eyes: Conjunctivae are normal. Right eye exhibits no discharge. Left eye exhibits no discharge. No scleral icterus.  Neck: Normal range of motion. Neck supple.  Cardiovascular: Normal rate, regular rhythm and normal heart sounds.   Pulmonary/Chest: Effort normal and breath sounds normal. No respiratory distress.  Abdominal: Soft. Normal appearance and bowel sounds are normal. There is tenderness in the epigastric area. There is no rigidity, no rebound and no guarding.  Genitourinary:  Hemoccult card positive for blood. Pt is currently menstruating and wearing a pad so results are unreliable. No fissures or hemorrhoids visualized.   Musculoskeletal: Normal range of motion.  Neurological: She is alert. Coordination normal.  Skin: Skin is warm and dry.  Psychiatric: She has a normal mood and affect. Her behavior  is normal.  Nursing note and vitals reviewed.   ED Course  Procedures (including critical care time) Labs Review Labs Reviewed  COMPREHENSIVE METABOLIC PANEL - Abnormal; Notable for the following:    Potassium 2.8 (*)    CO2 19 (*)    Glucose, Bld 102 (*)    Total Protein 8.7 (*)    Albumin 5.1 (*)    ALT 13 (*)    All other components within normal limits  CBC - Abnormal; Notable for the following:    RBC 3.33 (*)    Hemoglobin 9.6 (*)    HCT 28.8 (*)    Platelets 410 (*)    All other  components within normal limits  URINALYSIS, ROUTINE W REFLEX MICROSCOPIC (NOT AT [facility]) - Abnormal; Notable for the following:    APPearance CLOUDY (*)    Hgb urine dipstick MODERATE (*)    Ketones, ur 15 (*)    Leukocytes, UA MODERATE (*)    All other components within normal limits  URINE MICROSCOPIC-ADD ON - Abnormal; Notable for the following:    Squamous Epithelial / LPF 6-30 (*)    Bacteria, UA MANY (*)    All other components within normal limits  POC OCCULT BLOOD, ED - Abnormal; Notable for the following:    Fecal Occult Bld POSITIVE (*)    All other components within normal limits  LIPASE, BLOOD  I-STAT BETA HCG BLOOD, ED ([facility], [facility], [facility] ONLY)    Imaging Review No results found. I have personally reviewed and evaluated these images and lab results as part of my medical decision-making.   EKG Interpretation None      MDM   Final diagnoses:  Epigastric pain  UTI (lower urinary tract infection)   40 year old female presenting with epigastric pain with associated nausea vomiting x 3 days. This pain is chronic in nature x 2 years. She has never been evaluated by gastroenterology. VSS. Patient is nontoxic, nonseptic appearing, in no apparent distress. Abdomen is soft with tenderness in epigastric region. No peritoneal signs suggesting surgical abdomen. Patient's pain and other symptoms adequately managed in emergency department with protonix and morphine. Fluid bolus given. Labs and vitals reviewed. Potassium noted to be 2.8. This was repleted with 40 mEq of potassium in the emergency department. Patient's hemoglobin noted to be 9.6. She states that she has been told she is anemic in the past and is supposed be taking iron pills. She states that she does not like the way her stomach feels so she discontinued them. Most recent hemoglobin was 7 months ago and was 11. She denies symptomatic anemia. Urine positive for urinary tract infection. Performed a Hemoccult exam the patient is  actively menstruating and wearing a pad. These results are unreliable as it cannot determine if this is menstrual blood or GI blood. Patient does not meet the SIRS or Sepsis criteria. Repeat abdominal exam before discharge with improvement. Patient discharged home with symptomatic treatment including omeprazole and given strict instructions for follow-up with their primary care physician and gastroenterology. Patient was also noted to be hypertensive. She states she is supposed be on hypertension medicine but does not like taking them so she discontinued them. Had long discussion with patient about importance of follow-up with PCP to get her blood pressure under control and follow up with gastrology for her chronic abdominal pain. I have also discussed reasons to return immediately to the ER.  Patient expresses understanding and agrees with plan.  Alveta Heimlich, PA-C 10/17/15 1555  Lorre Nick, MD 10/18/15 [phone]

## 2015-10-17 NOTE — ED Notes (Signed)
Pt with abdominal pain and emesis since Friday.  No change in urination or vaginal discharge.  Pt states hx of hernia surgery.  Pt hypertensive in triage.

## 2015-10-17 NOTE — ED Notes (Signed)
RN drawing labs 

## 2015-11-09 ENCOUNTER — Other Ambulatory Visit: Payer: Self-pay | Admitting: Gastroenterology

## 2015-12-16 ENCOUNTER — Emergency Department (HOSPITAL_COMMUNITY): Payer: BLUE CROSS/BLUE SHIELD

## 2015-12-16 ENCOUNTER — Encounter (HOSPITAL_COMMUNITY): Payer: Self-pay | Admitting: Emergency Medicine

## 2015-12-16 ENCOUNTER — Emergency Department (HOSPITAL_COMMUNITY)
Admission: EM | Admit: 2015-12-16 | Discharge: 2015-12-16 | Disposition: A | Discharge status: Home / Self Care | Payer: BLUE CROSS/BLUE SHIELD | Attending: Emergency Medicine | Admitting: Emergency Medicine

## 2015-12-16 DIAGNOSIS — F17210 Nicotine dependence, cigarettes, uncomplicated: Secondary | ICD-10-CM | POA: Insufficient documentation

## 2015-12-16 DIAGNOSIS — Z79899 Other long term (current) drug therapy: Secondary | ICD-10-CM | POA: Insufficient documentation

## 2015-12-16 DIAGNOSIS — Z792 Long term (current) use of antibiotics: Secondary | ICD-10-CM | POA: Diagnosis not present

## 2015-12-16 DIAGNOSIS — Z3202 Encounter for pregnancy test, result negative: Secondary | ICD-10-CM | POA: Insufficient documentation

## 2015-12-16 DIAGNOSIS — I10 Essential (primary) hypertension: Secondary | ICD-10-CM | POA: Insufficient documentation

## 2015-12-16 DIAGNOSIS — N938 Other specified abnormal uterine and vaginal bleeding: Secondary | ICD-10-CM | POA: Diagnosis not present

## 2015-12-16 DIAGNOSIS — R102 Pelvic and perineal pain: Secondary | ICD-10-CM

## 2015-12-16 DIAGNOSIS — N939 Abnormal uterine and vaginal bleeding, unspecified: Secondary | ICD-10-CM | POA: Diagnosis present

## 2015-12-16 LAB — CBC WITH DIFFERENTIAL/PLATELET
Basophils Absolute: 0 10*3/uL (ref 0.0–0.1)
Basophils Relative: 0 %
Eosinophils Absolute: 0.2 10*3/uL (ref 0.0–0.7)
Eosinophils Relative: 4 %
HCT: 29.1 % — ABNORMAL LOW (ref 36.0–46.0)
Hemoglobin: 9.5 g/dL — ABNORMAL LOW (ref 12.0–15.0)
Lymphocytes Relative: 42 %
Lymphs Abs: 2.3 10*3/uL (ref 0.7–4.0)
MCH: 28.7 pg (ref 26.0–34.0)
MCHC: 32.6 g/dL (ref 30.0–36.0)
MCV: 87.9 fL (ref 78.0–100.0)
Monocytes Absolute: 0.2 10*3/uL (ref 0.1–1.0)
Monocytes Relative: 4 %
Neutro Abs: 2.8 10*3/uL (ref 1.7–7.7)
Neutrophils Relative %: 50 %
Platelets: 547 10*3/uL — ABNORMAL HIGH (ref 150–400)
RBC: 3.31 MIL/uL — ABNORMAL LOW (ref 3.87–5.11)
RDW: 19.4 % — ABNORMAL HIGH (ref 11.5–15.5)
WBC: 5.6 10*3/uL (ref 4.0–10.5)

## 2015-12-16 LAB — WET PREP, GENITAL
Clue Cells Wet Prep HPF POC: NONE SEEN
Sperm: NONE SEEN
Trich, Wet Prep: NONE SEEN
Yeast Wet Prep HPF POC: NONE SEEN

## 2015-12-16 LAB — POC URINE PREG, ED: Preg Test, Ur: NEGATIVE

## 2015-12-16 MED ORDER — OXYCODONE-ACETAMINOPHEN 5-325 MG PO TABS: 1.0000 | ORAL_TABLET | ORAL | Status: DC | PRN

## 2015-12-16 NOTE — ED Provider Notes (Signed)
Patient signed out to me by Dr. Rennis ChrisJacobowitz and labs and ultrasound reviewed. Patient will be given referral to GYN on call as well as short course of opiate medication  Lorre NickAnthony Gerardo Caiazzo, MD 12/16/15 1640

## 2015-12-16 NOTE — ED Notes (Signed)
Pt c/o passing blood clots while on her period. Pt sts her period started this past week and on Wednesday she began passing large clots. Pt sts this has never happened before. Pt c/o feeling weak and tired and worse abdominal cramping than usual. A&Ox4 and ambulatory. Pt sts she has hx of anemia. Denies dizziness, syncope.

## 2015-12-16 NOTE — Discharge Instructions (Signed)

## 2015-12-16 NOTE — ED Provider Notes (Addendum)
CSN: [id number]     Arrival date & time 12/16/15  1222 History   First MD Initiated Contact with Patient 12/16/15 1350     Chief Complaint  Patient presents with  . Vaginal Bleeding     (Consider location/radiation/quality/duration/timing/severity/associated sxs/prior Treatment) Patient is a 40 y.o. female presenting with vaginal bleeding.  Vaginal Bleeding Associated symptoms: abdominal pain    ComPlains of vaginal bleeding and crampy abdominal pain onset 3 days ago, onset with her normal menses. Pain is more severe than typical menstrual cramps. She has past blood clots and used 4 pads today. She normally uses 7 pads during her menstrual period. She denies lightheadedness or weakness. No other associated symptoms. Treated with Tylenol, without relief nothing makes symptoms better or worse. Past Medical History  Diagnosis Date  . Migraine   . Hypertension   Anemia, peptic ulcer disease Past Surgical History  Procedure Laterality Date  . Cesarean section      x 4  . Abdominal hernia repair     Family History  Problem Relation Age of Onset  . Hypertension Mother   . Colon cancer Father 51   Social History  Substance Use Topics  . Smoking status: Current Every Day Smoker -- 0.50 packs/day    Types: Cigarettes  . Smokeless tobacco: None  . Alcohol Use: No   OB History    No data available     Review of Systems  Constitutional: Negative.   HENT: Negative.   Respiratory: Negative.   Cardiovascular: Negative.   Gastrointestinal: Positive for abdominal pain.  Genitourinary: Positive for vaginal bleeding.  Musculoskeletal: Negative.   Skin: Negative.   Neurological: Negative.   Psychiatric/Behavioral: Negative.   All other systems reviewed and are negative.     Allergies  Aspirin and Ibuprofen  Home Medications   Prior to Admission medications   Medication Sig Start Date End Date Taking? Authorizing Provider  acetaminophen (TYLENOL) 500 MG tablet Take 1,000 mg  by mouth every 6 (six) hours as needed for moderate pain or headache.   Yes Historical Provider, MD  anti-nausea (EMETROL) solution Take 10 mLs by mouth every 15 (fifteen) minutes as needed for nausea or vomiting.   Yes Historical Provider, MD  ciprofloxacin (CIPRO) 500 MG tablet Take 500 mg by mouth 2 (two) times daily.   Yes Historical Provider, MD  Dextrose-Fructose-Sod Citrate (NAUZENE PO) Take 1 tablet by mouth daily as needed (nausea).   Yes Historical Provider, MD  diphenhydrAMINE (BENADRYL) 25 MG tablet Take 25 mg by mouth every 6 (six) hours as needed for allergies.   Yes Historical Provider, MD  Iron-FA-B Cmp-C-Biot-Probiotic (FUSION PLUS) CAPS Take 1 capsule by mouth daily.   Yes Historical Provider, MD  omeprazole (PRILOSEC) 20 MG capsule Take 1 capsule (20 mg total) by mouth daily. 10/17/15  Yes Stevi Barrett, PA-C  ondansetron (ZOFRAN-ODT) 8 MG disintegrating tablet Take 8 mg by mouth every 8 (eight) hours as needed for nausea or vomiting.   Yes Historical Provider, MD  polyethylene glycol (MIRALAX / GLYCOLAX) packet Take 17 g by mouth daily.   Yes Historical Provider, MD  sucralfate (CARAFATE) 1 g tablet Take 1 g by mouth 2 (two) times daily.   Yes Historical Provider, MD   BP 154/109 mmHg  Pulse 83  Temp(Src) 98.2 F (36.8 C) (Oral)  Resp 16  SpO2 100%  LMP 12/16/2015 Physical Exam  Constitutional: She appears well-developed and well-nourished. She appears distressed.  Mild uncomfortable  HENT:  Head: Normocephalic and atraumatic.  Eyes: Conjunctivae are normal. Pupils are equal, round, and reactive to light.  Neck: Neck supple. No tracheal deviation present. No thyromegaly present.  Cardiovascular: Normal rate and regular rhythm.   No murmur heard. Pulmonary/Chest: Effort normal and breath sounds normal.  Abdominal: Soft. Bowel sounds are normal. She exhibits no distension. There is no tenderness.  Tender at infraumbilical area  Genitourinary:  No external lesion.  Vaginal blood in vault. Slight oozing from cervix. No cervical motion tenderness. Tender uterine fundus. Fundus palpable 2-3 cm above the pubic symphysis  Musculoskeletal: Normal range of motion. She exhibits no edema or tenderness.  Neurological: She is alert. Coordination normal.  Skin: Skin is warm and dry. No rash noted.  Psychiatric: She has a normal mood and affect.  Nursing note and vitals reviewed.   ED Course  Procedures (including critical care time) Labs Review Labs Reviewed - No data to display  Imaging Review No results found. I have personally reviewed and evaluated these images and lab results as part of my medical decision-making.   EKG Interpretation None      Results for orders placed or performed during the hospital encounter of 12/16/15  Wet prep, genital  Result Value Ref Range   Yeast Wet Prep HPF POC NONE SEEN NONE SEEN   Trich, Wet Prep NONE SEEN NONE SEEN   Clue Cells Wet Prep HPF POC NONE SEEN NONE SEEN   WBC, Wet Prep HPF POC MODERATE (A) NONE SEEN   Sperm NONE SEEN   CBC with Differential/Platelet  Result Value Ref Range   WBC 5.6 4.0 - 10.5 K/uL   RBC 3.31 (L) 3.87 - 5.11 MIL/uL   Hemoglobin 9.5 (L) 12.0 - 15.0 g/dL   HCT 16.129.1 (L) 09.636.0 - 04.546.0 %   MCV 87.9 78.0 - 100.0 fL   MCH 28.7 26.0 - 34.0 pg   MCHC 32.6 30.0 - 36.0 g/dL   RDW 40.919.4 (H) 81.111.5 - 91.415.5 %   Platelets 547 (H) 150 - 400 K/uL   Neutrophils Relative % 50 %   Neutro Abs 2.8 1.7 - 7.7 K/uL   Lymphocytes Relative 42 %   Lymphs Abs 2.3 0.7 - 4.0 K/uL   Monocytes Relative 4 %   Monocytes Absolute 0.2 0.1 - 1.0 K/uL   Eosinophils Relative 4 %   Eosinophils Absolute 0.2 0.0 - 0.7 K/uL   Basophils Relative 0 %   Basophils Absolute 0.0 0.0 - 0.1 K/uL  P[facility] urine preg[facility])  Result Value Ref Range   Preg Test, Ur NEGATIVE NEGATIVE   No results found.   MDM  Opioid pain medicine withheld in the emergency department as patient driving home and she prefers to drive  home. Patient would likely benefit from small doses of opioid pain medicine if stable for discharge. Her hemoglobin is at baseline. Patient signed out to Dr. Freida BusmanAllen at 4 PM Diagnosis #1 dysmenorrhea #2 anemia #4 thrombocytosis #5pelvic pain Final diagnoses:  None        Doug SouSam Fintan Grater, MD 12/16/15 1604  Doug SouSam Khole Branch, MD 12/16/15 1605

## 2015-12-17 LAB — HIV ANTIBODY (ROUTINE TESTING W REFLEX): HIV Screen 4th Generation wRfx: NONREACTIVE

## 2015-12-17 LAB — RPR: RPR Ser Ql: NONREACTIVE

## 2015-12-19 LAB — GC/CHLAMYDIA PROBE AMP (~~LOC~~) NOT AT ARMC
Chlamydia: NEGATIVE
Neisseria Gonorrhea: NEGATIVE

## 2016-02-10 ENCOUNTER — Encounter: Payer: Self-pay | Admitting: *Deleted

## 2016-02-11 ENCOUNTER — Encounter (HOSPITAL_COMMUNITY): Payer: Self-pay

## 2016-02-11 ENCOUNTER — Emergency Department (HOSPITAL_COMMUNITY)
Admission: EM | Admit: 2016-02-11 | Discharge: 2016-02-11 | Disposition: A | Discharge status: Home / Self Care | Payer: BLUE CROSS/BLUE SHIELD | Attending: Emergency Medicine | Admitting: Emergency Medicine

## 2016-02-11 DIAGNOSIS — Z79899 Other long term (current) drug therapy: Secondary | ICD-10-CM | POA: Insufficient documentation

## 2016-02-11 DIAGNOSIS — I10 Essential (primary) hypertension: Secondary | ICD-10-CM | POA: Insufficient documentation

## 2016-02-11 DIAGNOSIS — F17210 Nicotine dependence, cigarettes, uncomplicated: Secondary | ICD-10-CM | POA: Insufficient documentation

## 2016-02-11 DIAGNOSIS — R064 Hyperventilation: Secondary | ICD-10-CM | POA: Insufficient documentation

## 2016-02-11 DIAGNOSIS — R55 Syncope and collapse: Secondary | ICD-10-CM | POA: Diagnosis not present

## 2016-02-11 HISTORY — DX: Gastro-esophageal reflux disease without esophagitis: K21.9

## 2016-02-11 NOTE — ED Notes (Signed)
Per [facility] Pt resides at home. Sudden onset of dizziness with increased RR to follow. HX of the same. Pt alert and oriented on scene.  No evaluation for this history. Pt hyperventilated into a syncopal event lasting 1 minute. No seizure activity. Compete recovery. Pt able to recall event. NEG FOR STROKE. NEURO INTACT. Denies N/V/D and fever. EKG unremarkable. NSR.

## 2016-02-11 NOTE — ED Notes (Signed)
Bed: WA06 Expected date:  Expected time:  Means of arrival:  Comments: EMS- 40 yo- dizzy, panic attack

## 2016-02-11 NOTE — ED Notes (Signed)
Patient refused blood draw.

## 2016-02-11 NOTE — ED Notes (Signed)
Pt states she got a little hot and now she feels fine. Son is 39 yo. Minor children are present.

## 2016-02-11 NOTE — ED Notes (Signed)
MD at bedside. 

## 2016-02-11 NOTE — ED Provider Notes (Signed)
CSN: [id number]     Arrival date & time 02/11/16  1145 History   First MD Initiated Contact with Patient 02/11/16 1205     Chief Complaint  Patient presents with  . Dizziness  . Near Syncope     (Consider location/radiation/quality/duration/timing/severity/associated sxs/prior Treatment) HPI Comments: Pt comes in with cc of syncope. Pt has hx of migraine. Pt reports that she got home after shopping and felt like she was "overheated." Then she started getting dizzy, sweaty, nauseated and felt like she was going to faint. The next thing she recalls is that she was in the ambulance. Daughters report that their mother fainted. There was no seizure like activity. They called 911. When EMS tried to stand patient up, she started having some shaking. EMS reports that the episode was not seizure like. EMS reports that pt was aox3, but pt reports not recalling any of this part of the encounter. EMS also reported that pt was hyperventilating and thus she was placed on NRB. Daughter do endorse the hyperventilation. No cardiac hx. No numbness, tinging, dizziness, weakness at this time. No drug use.   ROS 10 Systems reviewed and are negative for acute change except as noted in the HPI.      Patient is a 40 y.o. female presenting with dizziness and near-syncope. The history is provided by the patient.  Dizziness Near Syncope    Past Medical History  Diagnosis Date  . Migraine   . Hypertension   . GERD (gastroesophageal reflux disease)    Past Surgical History  Procedure Laterality Date  . Cesarean section      x 4  . Abdominal hernia repair     Family History  Problem Relation Age of Onset  . Hypertension Mother   . Colon cancer Father 45   Social History  Substance Use Topics  . Smoking status: Current Every Day Smoker -- 0.50 packs/day    Types: Cigarettes  . Smokeless tobacco: None  . Alcohol Use: No   OB History    No data available     Review of Systems   Cardiovascular: Positive for near-syncope.  Neurological: Positive for dizziness.      Allergies  Aspirin and Ibuprofen  Home Medications   Prior to Admission medications   Medication Sig Start Date End Date Taking? Authorizing Provider  acetaminophen (TYLENOL) 500 MG tablet Take 1,000 mg by mouth every 6 (six) hours as needed for moderate pain or headache.   Yes Historical Provider, MD  anti-nausea (EMETROL) solution Take 10 mLs by mouth every 15 (fifteen) minutes as needed for nausea or vomiting.   Yes Historical Provider, MD  Dextrose-Fructose-Sod Citrate (NAUZENE PO) Take 1 tablet by mouth daily as needed (nausea).   Yes Historical Provider, MD  diphenhydrAMINE (BENADRYL) 25 MG tablet Take 25 mg by mouth every 6 (six) hours as needed for allergies.   Yes Historical Provider, MD  Iron-FA-B Cmp-C-Biot-Probiotic (FUSION PLUS) CAPS Take 1 capsule by mouth daily.   Yes Historical Provider, MD  omeprazole (PRILOSEC) 20 MG capsule Take 1 capsule (20 mg total) by mouth daily. Patient taking differently: Take 20 mg by mouth 2 (two) times daily before a meal.  10/17/15  Yes Stevi Barrett, PA-C  ondansetron (ZOFRAN-ODT) 8 MG disintegrating tablet Take 8 mg by mouth every 8 (eight) hours as needed for nausea or vomiting.   Yes Historical Provider, MD  oxyCODONE-acetaminophen (PERCOCET/ROXICET) 5-325 MG tablet Take 1-2 tablets by mouth every 4 (four) hours as needed for moderate  pain or severe pain. 12/16/15  Yes Lorre NickAnthony Allen, MD  polyethylene glycol [facility](MIRALAX / GLYCOLAX) packet Take 17 g by mouth daily.   Yes Historical Provider, MD  sucralfate (CARAFATE) 1 g tablet Take 1 g by mouth 2 (two) times daily.   Yes Historical Provider, MD   BP 127/93 mmHg  Pulse 70  Temp(Src) 99 F (37.2 C)  Resp 18  Ht 5\' 2"  (1.575 m)  Wt 130 lb (58.968 kg)  BMI 23.77 kg/m2  SpO2 100% Physical Exam  Constitutional: She is oriented to person, place, and time. She appears well-developed.  HENT:  Head:  Normocephalic and atraumatic.  Eyes: Conjunctivae and EOM are normal. Pupils are equal, round, and reactive to light.  Neck: Normal range of motion. Neck supple.  Cardiovascular: Normal rate, regular rhythm and normal heart sounds.   Pulmonary/Chest: Effort normal and breath sounds normal. No respiratory distress.  Abdominal: Soft. Bowel sounds are normal. She exhibits no distension. There is no tenderness. There is no rebound and no guarding.  Neurological: She is alert and oriented to person, place, and time. No cranial nerve deficit. Coordination normal.  Cerebellar exam is normal (finger to nose) Sensory exam normal for bilateral upper and lower extremities - and patient is able to discriminate between sharp and dull. Motor exam is 4+/5   Skin: Skin is warm and dry.  Nursing note and vitals reviewed.   ED Course  Procedures (including critical care time) Labs Review Labs Reviewed  I-STAT CHEM 8, ED    Imaging Review No results found. I have personally reviewed and evaluated these images and lab results as part of my medical decision-making.   EKG Interpretation None      MDM   Final diagnoses:  Hyperventilation  Syncope, unspecified syncope type    DDx includes: Orthostatic hypotension Stroke Vertebral artery dissection/stenosis Dysrhythmia PE Vasovagal/neurocardiogenic syncope Aortic stenosis Valvular disorder/Cardiomyopathy Anemia Seizure   Pt comes in with syncope. Seizure syncope less likely. Hyperventilation and neurocardiogenic etiology more likely. Pt refused EKG and chem 8, and she just wanted to go to home. Pt left w/o me having a chance to have a discussion with her. Plan was to d/c post labs and monitoring anyways.     Derwood KaplanAnkit Emersen Mascari, MD 02/11/16 1443

## 2016-02-11 NOTE — ED Notes (Signed)
patient mentioned that she feels better and needs to go pick up her husband from work. Nurse Margaretmary DysAshley L .aware

## 2016-02-11 NOTE — ED Notes (Signed)
Observed pt walking out of ED with children. EDP Nanavati called and updated on pt's current status. Pt can be discharged.

## 2016-03-05 ENCOUNTER — Encounter: Payer: Self-pay | Admitting: Obstetrics & Gynecology

## 2016-03-05 ENCOUNTER — Ambulatory Visit (INDEPENDENT_AMBULATORY_CARE_PROVIDER_SITE_OTHER): Payer: BLUE CROSS/BLUE SHIELD | Admitting: Obstetrics & Gynecology

## 2016-03-05 VITALS — BP 140/93 | HR 89 | Wt 128.2 lb

## 2016-03-05 DIAGNOSIS — Z1151 Encounter for screening for human papillomavirus (HPV): Secondary | ICD-10-CM | POA: Diagnosis not present

## 2016-03-05 DIAGNOSIS — N939 Abnormal uterine and vaginal bleeding, unspecified: Secondary | ICD-10-CM

## 2016-03-05 DIAGNOSIS — Z124 Encounter for screening for malignant neoplasm of cervix: Secondary | ICD-10-CM | POA: Diagnosis not present

## 2016-03-05 MED ORDER — TRAMADOL HCL 50 MG PO TABS: 50.0000 mg | ORAL_TABLET | Freq: Four times a day (QID) | ORAL | Status: DC | PRN

## 2016-03-05 MED ORDER — MEGESTROL ACETATE 40 MG PO TABS: 40.0000 mg | ORAL_TABLET | Freq: Two times a day (BID) | ORAL | Status: DC

## 2016-03-05 NOTE — Progress Notes (Signed)
CLINIC ENCOUNTER NOTE  History:  40 y.o. F here today for AUB.  She reports having irregular periods that come twice a month and last several days for the past three months.  No associated presyncopal findings. She denies any abnormal vaginal discharge, bleeding, pelvic pain or other concerns.   Past Medical History  Diagnosis Date  . Migraine   . Hypertension   . GERD (gastroesophageal reflux disease)     Past Surgical History  Procedure Laterality Date  . Cesarean section      x 4  . Abdominal hernia repair      The following portions of the patient's history were reviewed and updated as appropriate: allergies, current medications, past family history, past medical history, past social history, past surgical history and problem list.   Health Maintenance:  Normal pap on 01/10/2010.   Review of Systems:  Pertinent items noted in HPI and remainder of comprehensive ROS otherwise negative.  Objective:  Physical Exam BP 140/93 mmHg  Pulse 89  Wt 128 lb 3.2 oz (58.151 kg)  LMP 02/10/2016 (Exact Date) CONSTITUTIONAL: Well-developed, well-nourished female in no acute distress.  HENT:  Normocephalic, atraumatic. External right and left ear normal. Oropharynx is clear and moist EYES: Conjunctivae and EOM are normal. Pupils are equal, round, and reactive to light. No scleral icterus.  NECK: Normal range of motion, supple, no masses SKIN: Skin is warm and dry. No rash noted. Not diaphoretic. No erythema. No pallor. NEUROLOGIC: Alert and oriented to person, place, and time. Normal reflexes, muscle tone coordination. No cranial nerve deficit noted. PSYCHIATRIC: Normal mood and affect. Normal behavior. Normal judgment and thought content. CARDIOVASCULAR: Normal heart rate noted RESPIRATORY: Effort and breath sounds normal, no problems with respiration noted ABDOMEN: Soft, no distention noted.   PELVIC: Normal appearing external genitalia; normal appearing vaginal mucosa and cervix.   Small amount of bleeding noted in vagina. Pap smear done.   Normal uterine size, no other palpable masses, no uterine or adnexal tenderness. MUSCULOSKELETAL: Normal range of motion. No edema noted.  Labs and Imaging 12/16/2015 TRANSABDOMINAL AND TRANSVAGINAL ULTRASOUND OF PELVIS CLINICAL DATA: Pain, passing clots, and cramping for 3 day COMPARISON: CT scan December 09, 2013  FINDINGS: Uterus  Measurements: 7.1 x 4.0 x 4.7 cm. No fibroids or other mass visualized.  Endometrium  Thickness: 7.1 mm. No focal abnormality visualized.  Right ovary  Measurements: 3.0 x 2.5 x 1.8 cm. Normal appearance/no adnexal mass.  Left ovary  Measurements: 2.5 x 1.7 x 2.7 cm. Normal appearance/no adnexal mass.  Other findings  No abnormal free fluid.  IMPRESSION: No acute abnormalities are identified.   Electronically Signed  By: Gerome Samavid Williams III M.D  Assessment & Plan:  Abnormal uterine bleeding (AUB) Discussed management options for abnormal uterine bleeding including tranexamic acid (Lysteda), oral progesterone (Megace), Depo Provera, Mirena IUD, endometrial ablation (Novasure/Hydrothermal Ablation) or hysterectomy as definitive surgical management.  Discussed risks and benefits of each method.   Patient desires Megace for now.  Printed patient education handouts were given to the patient to review at home.  Megace prescribed as needed for now, bleeding precautions reviewed.  - Cytology - PAP done today.   - megestrol (MEGACE) 40 MG tablet; Take 1 tablet (40 mg total) by mouth 2 (two) times daily.  Dispense: 60 tablet; Refill: 5 - traMADol (ULTRAM) 50 MG tablet; Take 1 tablet (50 mg total) by mouth every 6 (six) hours as needed for severe pain.  Dispense: 60 tablet; Refill: 0 If bleeding  continues, consider endometrial biopsy. Routine preventative health maintenance measures emphasized. Please refer to After Visit Summary for other counseling recommendations.   Return in  about 1 month (around 04/04/2016) for AUB follow up.  Total face-to-face time with patient: 20 minutes. Over 50% of encounter was spent on counseling and coordination of care.   Jaynie CollinsUGONNA  Wade Sigala, MD, FACOG Attending Obstetrician & Gynecologist, [facility]

## 2016-03-05 NOTE — Patient Instructions (Signed)
Thank you for enrolling in MyChart. Please follow the instructions below to securely access your online medical record. MyChart allows you to send messages to your doctor, view your test results, manage appointments, and more.   How Do I Sign Up? 1. In your Internet browser, go to [employer] Address [employer] and enter [email or web address]. 2. Click on the Sign Up Now link in the Sign In box. You will see the New Member Sign Up page. 3. Enter your MyChart Access Code exactly as it appears below. You will not need to use this code after you've completed the sign-up process. If you do not sign up before the expiration date, you must request a new code.  MyChart Access Code: [email or web address] Expires: 03/18/2016  3:15 PM  4. Enter your Social Security Number ([id number]) and Date of Birth (mm/dd/yyyy) as indicated and click Submit. You will be taken to the next sign-up page. 5. Create a MyChart ID. This will be your MyChart login ID and cannot be changed, so think of one that is secure and easy to remember. 6. Create a MyChart password. You can change your password at any time. 7. Enter your Password Reset Question and Answer. This can be used at a later time if you forget your password.  8. Enter your e-mail address. You will receive e-mail notification when new information is available in MyChart. 9. Click Sign Up. You can now view your medical record.   Additional Information Remember, MyChart is NOT to be used for urgent needs. For medical emergencies, dial 911.     Dysfunctional Uterine Bleeding Dysfunctional uterine bleeding is abnormal bleeding from the uterus. Dysfunctional uterine bleeding includes:  A period that comes earlier or later than usual.  A period that is lighter, heavier, or has blood clots.  Bleeding between periods.  Skipping one or more periods.  Bleeding after sexual intercourse.  Bleeding after menopause. HOME CARE INSTRUCTIONS  Pay attention to any changes in  your symptoms. Follow these instructions to help with your condition: Eating  Eat well-balanced meals. Include foods that are high in iron, such as liver, meat, shellfish, green leafy vegetables, and eggs.  If you become constipated:  Drink plenty of water.  Eat fruits and vegetables that are high in water and fiber, such as spinach, carrots, raspberries, apples, and mango. Medicines  Take over-the-counter and prescription medicines only as told by your health care provider.  Do not change medicines without talking with your health care provider.  Aspirin or medicines that contain aspirin may make the bleeding worse. Do not take those medicines:  During the week before your period.  During your period.  If you were prescribed iron pills, take them as told by your health care provider. Iron pills help to replace iron that your body loses because of this condition. Activity  If you need to change your sanitary pad or tampon more than one time every 2 hours:  Lie in bed with your feet raised (elevated).  Place a cold pack on your lower abdomen.  Rest as much as possible until the bleeding stops or slows down.  Do not try to lose weight until the bleeding has stopped and your blood iron level is back to normal. Other Instructions  For two months, write down:  When your period starts.  When your period ends.  When any abnormal bleeding occurs.  What problems you notice.  Keep all follow up visits as told by your health care provider. This is important. SEEK MEDICAL  CARE IF:  You get light-headed or weak.  You have nausea and vomiting.  You cannot eat or drink without vomiting.  You feel dizzy or have diarrhea while you are taking medicines.  You are taking birth control pills or hormones, and you want to change them or stop taking them. SEEK IMMEDIATE MEDICAL CARE IF:  You develop a fever or chills.  You need to change your sanitary pad or tampon more than one  time per hour.  Your bleeding becomes heavier, or your flow contains clots more often.  You develop pain in your abdomen.  You lose consciousness.  You develop a rash.   This information is not intended to replace advice given to you by your health care provider. Make sure you discuss any questions you have with your health care provider.   Document Released: 08/24/2000 Document Revised: 05/18/2015 Document Reviewed: 11/22/2014 Elsevier Interactive Patient Education [employer]2016 Elsevier Inc.

## 2016-03-06 LAB — CYTOLOGY - PAP

## 2016-03-28 ENCOUNTER — Ambulatory Visit: Payer: BLUE CROSS/BLUE SHIELD | Admitting: Obstetrics & Gynecology

## 2016-04-09 ENCOUNTER — Encounter: Payer: Self-pay | Admitting: Obstetrics and Gynecology

## 2016-04-09 ENCOUNTER — Other Ambulatory Visit: Payer: Self-pay | Admitting: Obstetrics and Gynecology

## 2016-04-09 ENCOUNTER — Ambulatory Visit (INDEPENDENT_AMBULATORY_CARE_PROVIDER_SITE_OTHER): Payer: BLUE CROSS/BLUE SHIELD | Admitting: Obstetrics and Gynecology

## 2016-04-09 ENCOUNTER — Other Ambulatory Visit (HOSPITAL_COMMUNITY)
Admission: RE | Admit: 2016-04-09 | Discharge: 2016-04-09 | Disposition: A | Discharge status: Home / Self Care | Payer: BLUE CROSS/BLUE SHIELD | Source: Ambulatory Visit | Attending: Obstetrics and Gynecology | Admitting: Obstetrics and Gynecology

## 2016-04-09 VITALS — BP 155/99 | HR 82 | Ht 62.0 in | Wt 134.6 lb

## 2016-04-09 DIAGNOSIS — D649 Anemia, unspecified: Secondary | ICD-10-CM | POA: Diagnosis not present

## 2016-04-09 DIAGNOSIS — N939 Abnormal uterine and vaginal bleeding, unspecified: Secondary | ICD-10-CM

## 2016-04-09 DIAGNOSIS — N921 Excessive and frequent menstruation with irregular cycle: Secondary | ICD-10-CM | POA: Insufficient documentation

## 2016-04-09 LAB — POCT PREGNANCY, URINE: Preg Test, Ur: NEGATIVE

## 2016-04-09 NOTE — Progress Notes (Signed)
Obstetrics and Gynecology Visit Return Patient Evaluation  Appointment Date: 04/09/2016  OBGYN Clinic: [facility] for [facility]  Primary Care Provider: No primary care provider on file.  Chief Complaint:  AUB folllow up  History of Present Illness: Shirley [name] is a 40 y.o. African-American G5P4. (Patient's last menstrual period was 04/02/2016.), seen for the above chief complaint. Her past medical history is significant for h/o c-section x 4, abdominal hernia repair, HTN, tobacco use, anemia (Hct 29 on 4/7)  Patient seen approximately 1 month ago but Dr. Macon Large for AUB and had a normal u/s and then a pap smear and HPV testing at that visit.  She states s/s started about 4 months ago and consists of two periods per month that lasts about a 7-10d in length and are separated by about a week in between and has cramping with them. She has ?rare hot flashes but denies any vaginal dryness or night sweats.  At her visit a month ago, she was on a pill which she cant remember and wasn't stated in the noted but she was put on megace 40 bid at that time, with no change in her s/s.   She states she has occasional HAs but no visual s/s but no s/s of anemia  Review of Systems:  Her 12 point review of systems is negative or as noted in the History of Present Illness.  Past Medical History:  Past Medical History:  Diagnosis Date  . GERD (gastroesophageal reflux disease)   . Hypertension   . Migraine     Past Surgical History:  Past Surgical History:  Procedure Laterality Date  . ABDOMINAL HERNIA REPAIR    . CESAREAN SECTION     x 4    Past Obstetrical History:  OB History    Gravida Para Term Preterm AB Living   5 4 3 1 1 4    SAB TAB Ectopic Multiple Live Births                  Obstetric Comments   c--section x 4. 36wk c-section x 1      Past Gynecological History: As per HPI.  Social History:  Social History   Social History  . Marital status: Legally Separated   Spouse name: N/A  . Number of children: N/A  . Years of education: N/A   Occupational History  . Not on file.   Social History Main Topics  . Smoking status: Current Every Day Smoker    Packs/day: 0.50    Types: Cigarettes  . Smokeless tobacco: Not on file  . Alcohol use No  . Drug use:     Types: Marijuana  . Sexual activity: Not on file   Other Topics Concern  . Not on file   Social History Narrative  . No narrative on file    Family History:  Family History  Problem Relation Age of Onset  . Hypertension Mother   . Colon cancer Father 28    Medications Shirley [name] had no medications administered during this visit. Current Outpatient Prescriptions  Medication Sig Dispense Refill  . acetaminophen (TYLENOL) 500 MG tablet Take 1,000 mg by mouth every 6 (six) hours as needed for moderate pain or headache.    . diphenhydrAMINE (BENADRYL) 25 MG tablet Take 25 mg by mouth every 6 (six) hours as needed for allergies.    . Iron-FA-B Cmp-C-Biot-Probiotic (FUSION PLUS) CAPS Take 1 capsule by mouth daily.    . megestrol (MEGACE) 40 MG tablet  Take 1 tablet (40 mg total) by mouth 2 (two) times daily. 60 tablet 5  . omeprazole (PRILOSEC) 20 MG capsule Take 1 capsule (20 mg total) by mouth daily. (Patient taking differently: Take 20 mg by mouth 2 (two) times daily before a meal. ) 30 capsule 0  . ondansetron (ZOFRAN-ODT) 8 MG disintegrating tablet Take 8 mg by mouth every 8 (eight) hours as needed for nausea or vomiting.    . polyethylene glycol (MIRALAX / GLYCOLAX) packet Take 17 g by mouth daily.    . sucralfate (CARAFATE) 1 g tablet Take 1 g by mouth 2 (two) times daily.    . traMADol (ULTRAM) 50 MG tablet Take 1 tablet (50 mg total) by mouth every 6 (six) hours as needed for severe pain. 60 tablet 0  . anti-nausea (EMETROL) solution Take 10 mLs by mouth every 15 (fifteen) minutes as needed for nausea or vomiting. Reported on 03/05/2016    . Dextrose-Fructose-Sod Citrate (NAUZENE PO)  Take 1 tablet by mouth daily as needed (nausea).    Marland Kitchen oxyCODONE-acetaminophen (PERCOCET/ROXICET) 5-325 MG tablet Take 1-2 tablets by mouth every 4 (four) hours as needed for moderate pain or severe pain. (Patient not taking: Reported on 03/05/2016) 10 tablet 0   No current facility-administered medications for this visit.     Allergies Aspirin and Ibuprofen   Physical Exam:  BP (!) 155/99 (BP Location: Left Arm, Patient Position: Sitting, Cuff Size: Normal)   Pulse 82   Ht 5\' 2"  (1.575 m)   Wt 134 lb 9.6 oz (61.1 kg)   LMP 04/02/2016   BMI 24.62 kg/m  Body mass index is 24.62 kg/m. General appearance: Well nourished, well developed female in no acute distress.  Neck:  Supple, normal appearance, and no thyromegaly  Cardiovascular: normal s1 and s2.  No murmurs, rubs or gallops. Respiratory:  Clear to auscultation bilateral. Normal respiratory effort Abdomen: positive bowel sounds and no masses, hernias; diffusely non tender to palpation, non distended Neuro/Psych:  Normal mood and affect.  Skin:  Warm and dry.  Lymphatic:  No inguinal lymphadenopathy.   Pelvic exam: is not limited by body habitus EGBUS: within normal limits Vagina: within normal limits and with no blood in the vault Her exam was notable for moderate atrophy on the external genitalia and some loss of vaginal rugae. Also, her uterus feels small at approx 6wk sized, but not mobile. 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   RV deferred  See procedure note for embx  Laboratory: as above  Radiology: Measurements: 7.1 x 4.0 x 4.7 cm. No fibroids or other mass visualized.  Endometrium  Thickness: 7.1 mm.  No focal abnormality visualized.  Right ovary  Measurements: 3.0 x  2.5 x 1.8 cm. Normal appearance/no adnexal mass.  Left ovary  Measurements: 2.5 x 1.7 x 2.7 cm. Normal appearance/no adnexal mass.  Other findings  No abnormal free fluid.  IMPRESSION: No acute abnormalities are identified.   Electronically Signed   By: Gerome Sam III M.D   On: 12/16/2015 16:21  Assessment: AUB. Pt stable  Plan:  Multiple options d/w pt but medical doesn't really seem viable due to lack of response to prior pills and she states she had an IUD in the past but  it was embedded. She also uses tobacco products and is above age 50 so COCs wouldn't be a great option for her and isn't wanting to try depo provera or nexplanon, which i'm not sure would be a great choice for her anyways.  Will f/u embx and also added on menopause labs given her exam in addition to PRL and TFTs  I told her if labs are [name] normal and not indicative of menopause, then I d/w her re: novasure ablation and goal to get her to regular, light monthly periods and not necessarily amenorrhea.   Orders Placed This Encounter  Procedures  . Prolactin  . Follicle Stimulating Hormone  . Estradiol  . TSH  . T4, free  . Pregnancy, urine POC    RTC PRN  Cornelia Copa MD Attending Center for [employer] [facility])

## 2016-04-09 NOTE — Procedures (Addendum)
Endometrial Biopsy Procedure Note  Pre-operative Diagnosis: AUB on megace  Post-operative Diagnosis: same. vuvlovaginal atrophy  Procedure Details  Urine pregnancy test was done and result was negative.  The risks (including infection, bleeding, pain, and uterine perforation) and benefits of the procedure were explained to the patient and Written informed consent was obtained.  The patient was placed in the dorsal lithotomy position.  Bimanual exam showed the uterus to be in the anteroflexed position.  A Graves' speculum inserted in the vagina, and the cervix was visualized and a pap smear performed at her last visit with Dr. Macon Large and it was negative and HPV negative. The cervix was then prepped with povidone iodine, and a sharp tenaculum was applied to the anterior lip of the cervix for stabilization.  A pipelle was inserted into the uterine cavity and sounded the uterus to a depth of 6.5cm.  A Minimal-small amount of tissue was collected after 2 passes. The sample was sent for pathologic examination.  Condition: Stable  Complications: None  Plan: The patient was advised to call for any fever or for prolonged or severe pain or bleeding. She was advised to use OTC analgesics as needed for mild to moderate pain. She was advised to avoid vaginal intercourse for 48 hours or until the bleeding has completely stopped.  Her exam was notable for moderate atrophy on the external genitalia and some loss of vaginal rugae. Also, her uterus feels small at approx 6wk sized, but not mobile. 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   Cornelia Copa MD Attending Center for [employer] [facility])

## 2016-04-10 LAB — TSH: TSH: 3.66 mIU/L

## 2016-04-10 LAB — FOLLICLE STIMULATING HORMONE: FSH: 4.1 m[IU]/mL

## 2016-04-10 LAB — PROLACTIN: Prolactin: 14.5 ng/mL

## 2016-04-10 LAB — T4, FREE: Free T4: 1.1 ng/dL (ref 0.8–1.8)

## 2016-04-10 NOTE — Addendum Note (Signed)
Addended by: Garret Reddish on: 04/10/2016 08:39 AM   Modules accepted: Orders

## 2016-04-11 ENCOUNTER — Encounter: Payer: Self-pay | Admitting: *Deleted

## 2016-04-13 LAB — ESTRADIOL, FREE

## 2016-04-20 ENCOUNTER — Telehealth: Payer: Self-pay

## 2016-04-20 NOTE — Telephone Encounter (Signed)
Per Dr. Vergie LivingPickens, pt labs are normal.  Contacted pt and informed her of the results.  She stated thank you.

## 2016-04-23 ENCOUNTER — Telehealth: Payer: Self-pay | Admitting: *Deleted

## 2016-04-23 NOTE — Telephone Encounter (Signed)
Received a voicemail from 04/20/16 afternoon stating she was speaking with a nurse re: results and reception went bad- would like a call .   I called Bonita QuinLinda and left a message I was returning her call - if you still have questions- please call our office back.

## 2016-05-02 NOTE — Telephone Encounter (Signed)
Patient has not returned call

## 2016-05-04 NOTE — Telephone Encounter (Signed)
I called Bonita QuinLinda back and she states she did not have good reception and needs to know the results. I informed her the endometrial biopsy was negative and Dr. Vergie LivingPickens said if she wants to do something about abnormal bleeding to make an appointment to discuss treatment options. She would like another appointment with Dr. Vergie LivingPickens to discuss her options. I informed her registrars would call her with an appt. She voices understanding.

## 2016-05-16 ENCOUNTER — Encounter: Payer: Self-pay | Admitting: Obstetrics and Gynecology

## 2016-06-13 ENCOUNTER — Ambulatory Visit: Payer: BLUE CROSS/BLUE SHIELD | Admitting: Obstetrics and Gynecology

## 2016-06-14 ENCOUNTER — Other Ambulatory Visit: Payer: Self-pay | Admitting: Obstetrics & Gynecology

## 2016-06-14 DIAGNOSIS — N939 Abnormal uterine and vaginal bleeding, unspecified: Secondary | ICD-10-CM

## 2016-07-11 ENCOUNTER — Ambulatory Visit (INDEPENDENT_AMBULATORY_CARE_PROVIDER_SITE_OTHER): Payer: BLUE CROSS/BLUE SHIELD | Admitting: Obstetrics and Gynecology

## 2016-07-11 ENCOUNTER — Encounter: Payer: Self-pay | Admitting: Obstetrics and Gynecology

## 2016-07-11 VITALS — BP 130/95 | HR 80 | Wt 134.0 lb

## 2016-07-11 DIAGNOSIS — Z862 Personal history of diseases of the blood and blood-forming organs and certain disorders involving the immune mechanism: Secondary | ICD-10-CM | POA: Diagnosis not present

## 2016-07-11 DIAGNOSIS — N938 Other specified abnormal uterine and vaginal bleeding: Secondary | ICD-10-CM | POA: Diagnosis not present

## 2016-07-11 LAB — CBC
HCT: 38.9 % (ref 35.0–45.0)
Hemoglobin: 12.9 g/dL (ref 11.7–15.5)
MCH: 31.2 pg (ref 27.0–33.0)
MCHC: 33.2 g/dL (ref 32.0–36.0)
MCV: 94.2 fL (ref 80.0–100.0)
MPV: 9.7 fL (ref 7.5–12.5)
Platelets: 353 10*3/uL (ref 140–400)
RBC: 4.13 MIL/uL (ref 3.80–5.10)
RDW: 13.8 % (ref 11.0–15.0)
WBC: 5.2 10*3/uL (ref 3.8–10.8)

## 2016-07-11 MED ORDER — TRANEXAMIC ACID 650 MG PO TABS: 1300.0000 mg | ORAL_TABLET | Freq: Three times a day (TID) | ORAL | 0 refills | Status: AC

## 2016-07-11 NOTE — Progress Notes (Addendum)
Obstetrics and Gynecology Visit Return Evaluation  Appointment Date: 07/11/2016  [facility]  Chief Complaint:  Chief Complaint  Patient presents with  . Follow-up   Interval History: still having what seems like are two periods a month that last for about 7 days and not really any different with the megace which she takes once per day; somewhat painful and crampy. Pt last seen on 7/31 and had negative EMBx (benign scant endometrium) with prior negative pap smear this year. She also had VV atrophy but had negative climateric s/s and had a negative lab work up then.    History of Present Illness: Shirley [name] is a 40 y.o. African-American G5P4. (Patient's last menstrual period was 04/02/2016.), seen for the above chief complaint. Her past medical history is significant for h/o c-section x 4, abdominal hernia repair, HTN, tobacco use, anemia (Hct 29 on 4/7)  Patient seen approximately 1 month ago but Dr. Macon LargeAnyanwu for AUB and had a normal u/s and then a pap smear and HPV testing at that visit.  She states s/s started about 4 months ago and consists of two periods per month that lasts about a 7-10d in length and are separated by about a week in between and has cramping with them. She has ?rare hot flashes but denies any vaginal dryness or night sweats.  At her visit a month ago, she was on a pill which she cant remember and wasn't stated in the noted but she was put on megace 40 bid at that time, with no change in her s/s.    Review of Systems: no s/s of anemia  Past Medical History:  Past Medical History:  Diagnosis Date  . GERD (gastroesophageal reflux disease)   . Hypertension   . Migraine     Past Surgical History:  Past Surgical History:  Procedure Laterality Date  . ABDOMINAL HERNIA REPAIR    . CESAREAN SECTION     x 4    Past Obstetrical History:  OB History  Gravida Para Term Preterm AB Living  5 4 3 1 1 4   SAB TAB Ectopic Multiple  Live Births               # Outcome Date GA Lbr Len/2nd Weight Sex Delivery Anes PTL Lv  5 Term           4 Term           3 Term           2 Preterm           1 AB             Obstetric Comments  c--section x 4. 36wk c-section x 1   Past Gynecological History: As per HPI.  Social History:  Social History   Social History  . Marital status: Legally Separated    Spouse name: N/A  . Number of children: N/A  . Years of education: N/A   Occupational History  . Not on file.   Social History Main Topics  . Smoking status: Current Every Day Smoker    Packs/day: 0.50    Types: Cigarettes  . Smokeless tobacco: Not on file  . Alcohol use No  . Drug use:     Types: Marijuana  . Sexual activity: Not on file   Other Topics Concern  . Not on file   Social History Narrative  . No narrative on file    Family History:  Family History  Problem Relation Age of Onset  . Hypertension Mother   . Colon cancer Father [id number]    Medications Iron  Allergies Aspirin and Ibuprofen   Physical Exam:  BP (!) 130/95   Pulse 80   Wt 134 lb (60.8 kg)   LMP 07/07/2016 (Exact Date)   BMI 24.51 kg/m  Body mass index is 24.51 kg/m. General appearance: Well nourished, well developed female in no acute distress.   Laboratory: as above  Radiology: see prior notes  Assessment: pt stable with continued AUB  Plan:  D/w pt again re: meds vs surgical. H/o IUD that was embedded and depo in the past and would not like to try those again. Has failed progestin only pills and not a candidate for combined OCPs given her HTN.  Given this, d/w her re: ablation vs definitive procedure. With ablation, d/w her that mirena and ablation appear to have equivalent long term results with goal for regular periods and not necessarily amenorrhea with one being more immediate. Risks of ablation d/w her, including possible difficulty dx malignancy in the future, recurrence risk and risk of injury to surrounding  structures, especially wit her h/o multiple LTCS incisions. Also d/w her that procedure just may not work and may need hyst for bleeding control anyways and [facility]. Risks of surgery d/w her, particularly in light of prior c-sections and lack of mobility on prior bimanual making adhesions more likely.    Pt states husband is to get vasectomy and and she would like to move forward with the ablation. Will get a CBC to check her anemia and PRN lysteda Rx given to pt (r/b/a) d/w her in case she has a heavy episode of bleeding to hopefully keep her from coming to the MAU.  Will need to confirm with patient in future visits if she was able to find a PCP for routine medical care.   Request send to the office for scheduling  Orders Placed This Encounter  Procedures  . CBC    RTC after surgery  Cornelia Copaharlie Debbie Bellucci, Jr MD Attending Center for [facility](Faculty Practice)

## 2016-07-20 ENCOUNTER — Encounter (HOSPITAL_COMMUNITY): Payer: Self-pay | Admitting: *Deleted

## 2016-07-23 ENCOUNTER — Encounter (HOSPITAL_COMMUNITY): Payer: Self-pay | Admitting: *Deleted

## 2016-07-31 ENCOUNTER — Ambulatory Visit (HOSPITAL_COMMUNITY): Payer: BLUE CROSS/BLUE SHIELD | Admitting: Anesthesiology

## 2016-07-31 ENCOUNTER — Observation Stay (HOSPITAL_COMMUNITY)
Admission: RE | Admit: 2016-07-31 | Discharge: 2016-08-01 | Disposition: A | Discharge status: Home / Self Care | Payer: BLUE CROSS/BLUE SHIELD | Source: Ambulatory Visit | Attending: Obstetrics & Gynecology | Admitting: Obstetrics & Gynecology

## 2016-07-31 ENCOUNTER — Encounter (HOSPITAL_COMMUNITY)
Admission: RE | Disposition: A | Discharge status: Home / Self Care | Payer: Self-pay | Source: Ambulatory Visit | Attending: Obstetrics & Gynecology

## 2016-07-31 ENCOUNTER — Encounter (HOSPITAL_COMMUNITY): Payer: Self-pay

## 2016-07-31 DIAGNOSIS — F17210 Nicotine dependence, cigarettes, uncomplicated: Secondary | ICD-10-CM | POA: Diagnosis not present

## 2016-07-31 DIAGNOSIS — I10 Essential (primary) hypertension: Secondary | ICD-10-CM | POA: Diagnosis not present

## 2016-07-31 DIAGNOSIS — R102 Pelvic and perineal pain: Secondary | ICD-10-CM

## 2016-07-31 DIAGNOSIS — K219 Gastro-esophageal reflux disease without esophagitis: Secondary | ICD-10-CM | POA: Insufficient documentation

## 2016-07-31 DIAGNOSIS — J45909 Unspecified asthma, uncomplicated: Secondary | ICD-10-CM | POA: Insufficient documentation

## 2016-07-31 DIAGNOSIS — N921 Excessive and frequent menstruation with irregular cycle: Secondary | ICD-10-CM | POA: Diagnosis present

## 2016-07-31 DIAGNOSIS — Z79899 Other long term (current) drug therapy: Secondary | ICD-10-CM | POA: Diagnosis not present

## 2016-07-31 DIAGNOSIS — G8918 Other acute postprocedural pain: Secondary | ICD-10-CM | POA: Diagnosis not present

## 2016-07-31 HISTORY — DX: Disease of blood and blood-forming organs, unspecified: D75.9

## 2016-07-31 HISTORY — DX: Anemia, unspecified: D64.9

## 2016-07-31 HISTORY — PX: HYSTEROSCOPY: SHX211

## 2016-07-31 HISTORY — DX: Cardiac murmur, unspecified: R01.1

## 2016-07-31 HISTORY — DX: Renal tubulo-interstitial disease, unspecified: N15.9

## 2016-07-31 HISTORY — PX: ENDOMETRIAL ABLATION: SHX621

## 2016-07-31 HISTORY — DX: Personal history of other diseases of the digestive system: Z87.19

## 2016-07-31 LAB — PREGNANCY, URINE: Preg Test, Ur: NEGATIVE

## 2016-07-31 SURGERY — ABLATION, ENDOMETRIUM, HYSTEROSCOPIC
Anesthesia: General | Site: Uterus

## 2016-07-31 MED ORDER — MEPERIDINE HCL 25 MG/ML IJ SOLN: 6.2500 mg | INTRAMUSCULAR | Status: DC | PRN

## 2016-07-31 MED ORDER — OXYCODONE HCL 5 MG PO TABS
5.0000 mg | ORAL_TABLET | Freq: Once | ORAL | Status: AC
  Administered 2016-07-31: 5 mg via ORAL

## 2016-07-31 MED ORDER — HYDROMORPHONE HCL 1 MG/ML IJ SOLN
2.0000 mg | Freq: Once | INTRAMUSCULAR | Status: AC
  Administered 2016-07-31: 2 mg via INTRAVENOUS

## 2016-07-31 MED ORDER — FENTANYL CITRATE (PF) 100 MCG/2ML IJ SOLN
50.0000 ug | Freq: Once | INTRAMUSCULAR | Status: AC
  Administered 2016-07-31: 50 ug via INTRAVENOUS

## 2016-07-31 MED ORDER — KETOROLAC TROMETHAMINE 30 MG/ML IJ SOLN
30.0000 mg | Freq: Once | INTRAMUSCULAR | Status: AC
  Administered 2016-07-31: 30 mg via INTRAVENOUS

## 2016-07-31 MED ORDER — FENTANYL CITRATE (PF) 100 MCG/2ML IJ SOLN
INTRAMUSCULAR | Status: AC
  Filled 2016-07-31: qty 2

## 2016-07-31 MED ORDER — LIDOCAINE HCL (CARDIAC) 20 MG/ML IV SOLN
INTRAVENOUS | Status: AC
  Filled 2016-07-31: qty 5

## 2016-07-31 MED ORDER — ONDANSETRON HCL 4 MG PO TABS: 4.0000 mg | ORAL_TABLET | Freq: Four times a day (QID) | ORAL | Status: DC | PRN

## 2016-07-31 MED ORDER — SIMETHICONE 80 MG PO CHEW: 80.0000 mg | CHEWABLE_TABLET | Freq: Four times a day (QID) | ORAL | Status: DC | PRN

## 2016-07-31 MED ORDER — HYDROMORPHONE HCL 1 MG/ML IJ SOLN
1.0000 mg | INTRAMUSCULAR | Status: DC | PRN
  Administered 2016-07-31: 2 mg via INTRAVENOUS
  Filled 2016-07-31: qty 2

## 2016-07-31 MED ORDER — ALUM & MAG HYDROXIDE-SIMETH 200-200-20 MG/5ML PO SUSP: 30.0000 mL | ORAL | Status: DC | PRN

## 2016-07-31 MED ORDER — EPHEDRINE SULFATE 50 MG/ML IJ SOLN
INTRAMUSCULAR | Status: DC | PRN
  Administered 2016-07-31 (×2): 5 mg via INTRAVENOUS

## 2016-07-31 MED ORDER — PHENYLEPHRINE HCL 10 MG/ML IJ SOLN
INTRAMUSCULAR | Status: DC | PRN
  Administered 2016-07-31 (×2): 80 ug via INTRAVENOUS
  Administered 2016-07-31: 40 ug via INTRAVENOUS

## 2016-07-31 MED ORDER — SODIUM CHLORIDE 0.9 % IR SOLN
Status: DC | PRN
  Administered 2016-07-31: 3000 mL

## 2016-07-31 MED ORDER — PROPOFOL 10 MG/ML IV BOLUS
INTRAVENOUS | Status: DC | PRN
  Administered 2016-07-31: 200 mg via INTRAVENOUS
  Administered 2016-07-31: 180 mg via INTRAVENOUS

## 2016-07-31 MED ORDER — ACETAMINOPHEN 160 MG/5ML PO SOLN
325.0000 mg | ORAL | Status: DC | PRN
Start: 2016-07-31 — End: 2016-07-31

## 2016-07-31 MED ORDER — LIDOCAINE HCL (PF) 1 % IJ SOLN: INTRAMUSCULAR | Status: DC | PRN

## 2016-07-31 MED ORDER — KETOROLAC TROMETHAMINE 30 MG/ML IJ SOLN
INTRAMUSCULAR | Status: AC
  Filled 2016-07-31: qty 1

## 2016-07-31 MED ORDER — ONDANSETRON HCL 4 MG/2ML IJ SOLN
4.0000 mg | Freq: Once | INTRAMUSCULAR | Status: AC | PRN
  Administered 2016-07-31: 4 mg via INTRAVENOUS

## 2016-07-31 MED ORDER — DOCUSATE SODIUM 100 MG PO CAPS
100.0000 mg | ORAL_CAPSULE | Freq: Two times a day (BID) | ORAL | Status: DC
  Administered 2016-07-31: 100 mg via ORAL
  Filled 2016-07-31: qty 1

## 2016-07-31 MED ORDER — LACTATED RINGERS IV SOLN
INTRAVENOUS | Status: DC
  Administered 2016-07-31: 21:00:00 via INTRAVENOUS

## 2016-07-31 MED ORDER — OXYCODONE-ACETAMINOPHEN 5-325 MG PO TABS
1.0000 | ORAL_TABLET | ORAL | Status: DC | PRN
  Administered 2016-08-01 (×2): 2 via ORAL
  Filled 2016-07-31 (×2): qty 2

## 2016-07-31 MED ORDER — LACTATED RINGERS IV SOLN
INTRAVENOUS | Status: DC
  Administered 2016-07-31 (×2): via INTRAVENOUS

## 2016-07-31 MED ORDER — ALBUTEROL SULFATE (2.5 MG/3ML) 0.083% IN NEBU: 3.0000 mL | INHALATION_SOLUTION | Freq: Four times a day (QID) | RESPIRATORY_TRACT | Status: DC | PRN

## 2016-07-31 MED ORDER — ONDANSETRON HCL 4 MG/2ML IJ SOLN: 4.0000 mg | Freq: Four times a day (QID) | INTRAMUSCULAR | Status: DC | PRN

## 2016-07-31 MED ORDER — DEXAMETHASONE SODIUM PHOSPHATE 10 MG/ML IJ SOLN
INTRAMUSCULAR | Status: DC | PRN
  Administered 2016-07-31: 4 mg via INTRAVENOUS

## 2016-07-31 MED ORDER — HYDROMORPHONE HCL 1 MG/ML IJ SOLN
INTRAMUSCULAR | Status: AC
  Filled 2016-07-31: qty 2

## 2016-07-31 MED ORDER — FENTANYL CITRATE (PF) 100 MCG/2ML IJ SOLN
INTRAMUSCULAR | Status: DC | PRN
  Administered 2016-07-31 (×4): 50 ug via INTRAVENOUS

## 2016-07-31 MED ORDER — MIDAZOLAM HCL 2 MG/2ML IJ SOLN
INTRAMUSCULAR | Status: AC
  Filled 2016-07-31: qty 2

## 2016-07-31 MED ORDER — ACETAMINOPHEN 325 MG PO TABS: 325.0000 mg | ORAL_TABLET | ORAL | Status: DC | PRN

## 2016-07-31 MED ORDER — HYDRALAZINE HCL 20 MG/ML IJ SOLN
10.0000 mg | Freq: Once | INTRAMUSCULAR | Status: AC
  Administered 2016-07-31: 10 mg via INTRAVENOUS
  Filled 2016-07-31: qty 1

## 2016-07-31 MED ORDER — FENTANYL CITRATE (PF) 100 MCG/2ML IJ SOLN
25.0000 ug | INTRAMUSCULAR | Status: DC | PRN
  Administered 2016-07-31 (×3): 50 ug via INTRAVENOUS

## 2016-07-31 MED ORDER — EPHEDRINE 5 MG/ML INJ
INTRAVENOUS | Status: AC
  Filled 2016-07-31: qty 10

## 2016-07-31 MED ORDER — LISINOPRIL 20 MG PO TABS
20.0000 mg | ORAL_TABLET | Freq: Every day | ORAL | Status: DC
Start: 2016-07-31 — End: 2016-08-01
  Administered 2016-07-31: 20 mg via ORAL
  Filled 2016-07-31 (×2): qty 1

## 2016-07-31 MED ORDER — OXYCODONE HCL 5 MG/5ML PO SOLN: 5.0000 mg | Freq: Once | ORAL | Status: DC | PRN

## 2016-07-31 MED ORDER — MAGNESIUM CITRATE PO SOLN: 1.0000 | Freq: Once | ORAL | Status: DC | PRN

## 2016-07-31 MED ORDER — ONDANSETRON HCL 4 MG/2ML IJ SOLN
INTRAMUSCULAR | Status: DC | PRN
  Administered 2016-07-31: 4 mg via INTRAVENOUS

## 2016-07-31 MED ORDER — DEXAMETHASONE SODIUM PHOSPHATE 4 MG/ML IJ SOLN
INTRAMUSCULAR | Status: AC
  Filled 2016-07-31: qty 1

## 2016-07-31 MED ORDER — PROMETHAZINE HCL 25 MG/ML IJ SOLN
INTRAMUSCULAR | Status: AC
  Filled 2016-07-31: qty 1

## 2016-07-31 MED ORDER — LIDOCAINE HCL (CARDIAC) 20 MG/ML IV SOLN
INTRAVENOUS | Status: DC | PRN
  Administered 2016-07-31: 100 mg via INTRAVENOUS

## 2016-07-31 MED ORDER — KETOROLAC TROMETHAMINE 30 MG/ML IJ SOLN
INTRAMUSCULAR | Status: AC
  Filled 2016-07-31: qty 2

## 2016-07-31 MED ORDER — ACETAMINOPHEN 10 MG/ML IV SOLN
1000.0000 mg | Freq: Once | INTRAVENOUS | Status: AC
  Administered 2016-07-31: 1000 mg via INTRAVENOUS
  Filled 2016-07-31: qty 100

## 2016-07-31 MED ORDER — OXYCODONE HCL 5 MG PO TABS: 5.0000 mg | ORAL_TABLET | Freq: Four times a day (QID) | ORAL | 0 refills | Status: DC | PRN

## 2016-07-31 MED ORDER — BISACODYL 10 MG RE SUPP: 10.0000 mg | Freq: Every day | RECTAL | Status: DC | PRN

## 2016-07-31 MED ORDER — SENNOSIDES-DOCUSATE SODIUM 8.6-50 MG PO TABS: 1.0000 | ORAL_TABLET | Freq: Every evening | ORAL | Status: DC | PRN

## 2016-07-31 MED ORDER — GLYCOPYRROLATE 0.2 MG/ML IJ SOLN
INTRAMUSCULAR | Status: AC
  Filled 2016-07-31: qty 1

## 2016-07-31 MED ORDER — PROMETHAZINE HCL 25 MG/ML IJ SOLN
6.2500 mg | INTRAMUSCULAR | Status: DC | PRN
  Administered 2016-07-31: 6.25 mg via INTRAVENOUS

## 2016-07-31 MED ORDER — PANTOPRAZOLE SODIUM 40 MG PO TBEC
40.0000 mg | DELAYED_RELEASE_TABLET | Freq: Two times a day (BID) | ORAL | Status: DC
  Administered 2016-07-31: 40 mg via ORAL
  Filled 2016-07-31: qty 1

## 2016-07-31 MED ORDER — PROPOFOL 10 MG/ML IV BOLUS
INTRAVENOUS | Status: AC
  Filled 2016-07-31: qty 20

## 2016-07-31 MED ORDER — MENTHOL 3 MG MT LOZG: 1.0000 | LOZENGE | OROMUCOSAL | Status: DC | PRN

## 2016-07-31 MED ORDER — ONDANSETRON HCL 4 MG/2ML IJ SOLN
INTRAMUSCULAR | Status: AC
  Filled 2016-07-31: qty 2

## 2016-07-31 MED ORDER — ZOLPIDEM TARTRATE 5 MG PO TABS: 5.0000 mg | ORAL_TABLET | Freq: Every evening | ORAL | Status: DC | PRN

## 2016-07-31 MED ORDER — OXYCODONE HCL 5 MG PO TABS
ORAL_TABLET | ORAL | Status: AC
  Filled 2016-07-31: qty 1

## 2016-07-31 MED ORDER — ALBUTEROL SULFATE HFA 108 (90 BASE) MCG/ACT IN AERS
INHALATION_SPRAY | RESPIRATORY_TRACT | Status: DC | PRN
  Administered 2016-07-31 (×3): 2 via RESPIRATORY_TRACT

## 2016-07-31 MED ORDER — GLYCOPYRROLATE 0.2 MG/ML IJ SOLN
INTRAMUSCULAR | Status: DC | PRN
  Administered 2016-07-31: 0.1 mg via INTRAVENOUS

## 2016-07-31 MED ORDER — OXYCODONE HCL 5 MG PO TABS: 5.0000 mg | ORAL_TABLET | Freq: Once | ORAL | Status: DC | PRN

## 2016-07-31 MED ORDER — ALBUTEROL SULFATE HFA 108 (90 BASE) MCG/ACT IN AERS
INHALATION_SPRAY | RESPIRATORY_TRACT | Status: AC
Start: 2016-07-31 — End: 2016-07-31
  Filled 2016-07-31: qty 6.7

## 2016-07-31 MED ORDER — SILVER NITRATE-POT NITRATE 75-25 % EX MISC
CUTANEOUS | Status: AC
  Filled 2016-07-31: qty 1

## 2016-07-31 MED ORDER — SCOPOLAMINE 1 MG/3DAYS TD PT72: 1.0000 | MEDICATED_PATCH | Freq: Once | TRANSDERMAL | Status: DC

## 2016-07-31 MED ORDER — KETOROLAC TROMETHAMINE 30 MG/ML IJ SOLN: 30.0000 mg | Freq: Four times a day (QID) | INTRAMUSCULAR | Status: DC | PRN

## 2016-07-31 MED ORDER — MIDAZOLAM HCL 2 MG/2ML IJ SOLN
INTRAMUSCULAR | Status: DC | PRN
  Administered 2016-07-31: 2 mg via INTRAVENOUS

## 2016-07-31 MED ORDER — LIDOCAINE HCL 1 % IJ SOLN
INTRAMUSCULAR | Status: AC
  Filled 2016-07-31: qty 20

## 2016-07-31 MED ORDER — KETOROLAC TROMETHAMINE 60 MG/2ML IM SOLN
30.0000 mg | Freq: Once | INTRAMUSCULAR | Status: AC
  Administered 2016-07-31: 30 mg via INTRAMUSCULAR

## 2016-07-31 MED ORDER — PHENYLEPHRINE 40 MCG/ML (10ML) SYRINGE FOR IV PUSH (FOR BLOOD PRESSURE SUPPORT)
PREFILLED_SYRINGE | INTRAVENOUS | Status: AC
  Filled 2016-07-31: qty 10

## 2016-07-31 SURGICAL SUPPLY — 18 items
ABLATOR ENDOMETRIAL BIPOLAR (ABLATOR) ×1 IMPLANT
CANISTER SUCT 3000ML (MISCELLANEOUS) ×3 IMPLANT
CATH ROBINSON RED A/P 16FR (CATHETERS) ×3 IMPLANT
CLOTH BEACON ORANGE TIMEOUT ST (SAFETY) ×3 IMPLANT
CONTAINER PREFILL 10% NBF 60ML (FORM) ×1 IMPLANT
GLOVE BIO SURGEON STRL SZ7 (GLOVE) ×3 IMPLANT
GLOVE BIOGEL PI IND STRL 7.0 (GLOVE) ×2 IMPLANT
GLOVE BIOGEL PI IND STRL 7.5 (GLOVE) ×2 IMPLANT
GLOVE BIOGEL PI INDICATOR 7.0 (GLOVE) ×1
GLOVE BIOGEL PI INDICATOR 7.5 (GLOVE) ×1
GOWN STRL REUS W/TWL LRG LVL3 (GOWN DISPOSABLE) ×3 IMPLANT
PACK VAGINAL MINOR WOMEN LF (CUSTOM PROCEDURE TRAY) ×3 IMPLANT
PAD OB MATERNITY 4.3X12.25 (PERSONAL CARE ITEMS) ×3 IMPLANT
SET GENESYS HTA PROCERVA (MISCELLANEOUS) ×2 IMPLANT
TOWEL OR 17X24 6PK STRL BLUE (TOWEL DISPOSABLE) ×3 IMPLANT
TUBING AQUILEX INFLOW (TUBING) ×3 IMPLANT
TUBING AQUILEX OUTFLOW (TUBING) ×3 IMPLANT
WATER STERILE IRR 1000ML POUR (IV SOLUTION) ×3 IMPLANT

## 2016-07-31 NOTE — Anesthesia Postprocedure Evaluation (Signed)
Anesthesia Post Note  Patient: Shirley AlleyLinda A [name]  Procedure(s) Performed: Procedure(s): HYSTEROSCOPY WITH HYDROTHERMAL ABLATION  Patient location during evaluation: PACU Anesthesia Type: General Level of consciousness: awake and alert Pain management: pain level controlled Vital Signs Assessment: post-procedure vital signs reviewed and stable Respiratory status: spontaneous breathing, nonlabored ventilation and respiratory function stable Cardiovascular status: blood pressure returned to baseline and stable Postop Assessment: no signs of nausea or vomiting Anesthetic complications: no     Last Vitals:  Vitals:   07/31/16 1957 07/31/16 2030  BP: (!) 169/100 (!) 170/100  Pulse: 73   Resp: 15 16  Temp: 36.9 C 36.8 C    Last Pain:  Vitals:   07/31/16 1957  TempSrc:   PainSc: 7    Pain Goal: Patients Stated Pain Goal: 3 (07/31/16 1815)               Linton RumpJennifer Dickerson Devery Murgia

## 2016-07-31 NOTE — Progress Notes (Signed)
Patient and her family are more comfortable with overnight observation given her severe pain.  Will admit for observation, orders placed.  Dr. Vergie LivingPickens notified.  Tereso NewcomerUgonna A Tania Perrott, MD

## 2016-07-31 NOTE — Progress Notes (Signed)
Patient has persistent elevated BP, has not been on medications since 4-5 years ago.  ON review of BP, had elevated BP before surgery, but it has gotten very elevated since surgery.  Vitals:   07/31/16 1815 07/31/16 1916 07/31/16 1957 07/31/16 2030  BP: (!) 153/100 (!) 147/87 (!) 169/100 (!) 170/100  Pulse: 87 83 73   Resp: 16 16 15 16   Temp: 98.7 F (37.1 C)  98.4 F (36.9 C) 98.3 F (36.8 C)  TempSrc:      SpO2: 100% 100% 100% 100%  Weight:      Height:       No cardiac symptoms. Will give Hydralazine 10 mg IV x 1 now and start Lisinopril 20 mg daily for maintenance therapy. Will continue to watch closely.  Of note, pain control is improved, now 5/10 from 9/10.  Tereso NewcomerUgonna A Larnell Granlund, MD

## 2016-07-31 NOTE — Progress Notes (Signed)
Faculty Practice OB/GYN Attending Note   Subjective:  Called to evaluate patient with continued pelvic and lower abdominal pain s/p HTA a few hours ago. Has received Fentanyl, Demerol, Oxycodone with minimal relief. Just received Dilaudid 2 mg IV x 1.   No nausea, vomiting, chills, fevers, urinary problems.  She has multiple family members (husband and three daughters present).     Objective:  Blood pressure (!) 166/101, pulse 78, temperature 98.7 F (37.1 C), resp. rate 20, height 5' 2.5" (1.588 m), weight 134 lb (60.8 kg), last menstrual period 07/07/2016, SpO2 100 %. Gen: In distress 2/2 pain HENT: Normocephalic, atraumatic Lungs: Normal respiratory effort Heart: Regular rate noted Abdomen: Moderate TTP in lower abdomen, +voluntary guarding, no rebound Cervix: Minimal bleeding on pad Ext: 2+ DTRs, no edema, no cyanosis,  Assessment & Plan:  40 y.o. [id number] s/p HTA with increased postoperative pain. No signs of operative injury for now.  Patient is listed to be allergic to Ibuprofen; but reports taking it before and stopped it due to stomach ulcers.  No bronchospasm or respiratory distress secondary to NSAIDs. Will do trial of Toradol 30 mg IM and IV. If pain persists, will admit for overnight observation.  Patient is hesitant to be admitted, but will agree to observation if pain persists. Will continue close observation in PACU for now.   Jaynie CollinsUGONNA  Nekia Maxham, MD, FACOG Attending Obstetrician & Gynecologist Faculty Practice, [facility] [place]

## 2016-07-31 NOTE — Op Note (Signed)
Operative Note   07/31/2016  PRE-OP DIAGNOSIS: Abnormal uterine bleeding (meno-metrorrhagia)   POST-OP DIAGNOSIS: Same   SURGEON: Surgeon(s) and Role:    * [place] Bingharlie Darby Fleeman, MD - Primary  ASSISTANT: None  PROCEDURE: Procedure(s): HYSTEROSCOPY WITH HYDROTHERMAL ABLATION   ANESTHESIA: General   ESTIMATED BLOOD LOSS: 10mL  DRAINS: 75mL UOP via I/O cath   TOTAL IV FLUIDS: 1000mL crystalloid  SPECIMENS: none  VTE PROPHYLAXIS: SCDs to the bilateral lower extremities  ANTIBIOTICS: not indicated  FLUID DEFICIT: none  COMPLICATIONS: difficulties with the airway prior to case starting so this necessitated GETA. Also, part for hysteroscopy not available so approximately 20 minutes spent waiting on replacement part.   DISPOSITION: PACU - hemodynamically stable.  CONDITION: stable  FINDINGS: Exam under anesthesia revealed small, non mobile uterus with no masses and bilateral adnexa without masses or fullness. Hysteroscopy revealed a grossly normal appearing uterine cavity with bilateral tubal ostia and normal appearing endocervical canal. Atrophic appearing endometrial cavity with shaggy appearance to parts of it. Uniform ablation noted at the end of the case. Patient sounded to 9.5cm and the uterus was very retroverted so HTA done instead of Novasure ablation.    PROCEDURE IN DETAIL:  After informed consent was obtained, the patient was taken to the operating room where anesthesia was obtained without difficulty. The patient was positioned in the dorsal lithotomy position in [place] stirrups.  The patient's bladder was catheterized with an in and out foley catheter.  The patient was examined under anesthesia, with the above noted findings.  The bi-valved speculum was placed inside the patient's vagina, and the the anterior lip of the cervix was seen and grasped with the tenaculum.The uterine cavity was sounded to 9.5cm, and then the cervix was progressively dilated to a 23 French-Pratt  dilator.  The hysteroscope was introduced, with the above noted findings. The hysteroscope was then moved down to just past the internal os and the HTA safety checks and ablation were done per the manufacturer's standard.  After ablation and cool down period, diagnostic hysteroscopy done with the above noted findings. Excellent hemostasis was noted, and all instruments were removed.  She was then taken out of dorsal lithotomy. The patient tolerated the procedure well.  Sponge, lap and instrument counts were correct x2.  The patient was taken to recovery room in excellent condition.  Cornelia Copaharlie Gabi Mcfate, Jr MD Attending [facility](Faculty Practice)

## 2016-07-31 NOTE — Discharge Instructions (Addendum)
   We will discuss your surgery once again in detail at your post-op visit in two to four weeks. If you haven't already done so, please call to make your appointment as soon as possible.  Hysteroscopy, Care After These instructions give you information on caring for yourself after your procedure. Your doctor may also give you more specific instructions. Call your doctor if you have any problems or questions after your procedure. HOME CARE Do not drive for 24 hours. Wait 1 week before doing any activities that wear you out. Do not stand for a long time. Limit stair climbing to once or twice a day. Rest often. Continue with your usual diet. Drink enough fluids to keep your pee (urine) clear or pale yellow. If you have a hard time pooping (constipation), you may: Take a medicine to help you go poop (laxative) as told by your doctor. Eat more fruit and bran. Drink more fluids. Take showers, not baths, for as long as told by your doctor. Do not swim or use a hot tub until your doctor says it is okay. Have someone with you for 1day after the procedure. Do not douche, use tampons, or have sex (intercourse) until seen by your doctor Only take medicines as told by your doctor. Do not take aspirin. It can cause bleeding. Keep all doctor visits. GET HELP IF: You have cramps or pain not helped by medicine. You have new pain in the belly (abdomen). You have a bad smelling fluid coming from your vagina. You have a rash. You have problems with any medicine. GET HELP RIGHT AWAY IF:  You start to bleed more than a regular period. You have a fever. You have chest pain. You have trouble breathing. You feel dizzy or feel like passing out (fainting). You pass out. You have pain in the tops of your shoulders. You have vaginal bleeding with or without clumps of blood (blood clots). MAKE SURE YOU: Understand these instructions. Will watch your condition. Will get help right away if you are not doing  well or get worse. Document Released: 06/05/2008 Document Revised: 09/01/2013 Document Reviewed: 03/26/2013 ExitCare Patient Information 2015 [employer]. This information is not intended to replace advice given to you by your health care provider. Make sure you discuss any questions you have with your health care provider.   

## 2016-07-31 NOTE — Anesthesia Procedure Notes (Signed)
Procedure Name: LMA Insertion Date/Time: 07/31/2016 1:18 PM Performed by: Elgie CongoMALINOVA, Mattie Novosel H Pre-anesthesia Checklist: Patient identified, Emergency Drugs available, Suction available and Patient being monitored Patient Re-evaluated:Patient Re-evaluated prior to inductionOxygen Delivery Method: Circle system utilized Preoxygenation: Pre-oxygenation with 100% oxygen Intubation Type: IV induction LMA: LMA inserted LMA Size: 4.0 Number of attempts: 1 Placement Confirmation: positive ETCO2 and breath sounds checked- equal and bilateral Tube secured with: Tape Dental Injury: Teeth and Oropharynx as per pre-operative assessment

## 2016-07-31 NOTE — Progress Notes (Signed)
Dr Cipriano BunkerAnwanya  In phase 11. Talked with patient . Will admit patient  For overnight observation.

## 2016-07-31 NOTE — Progress Notes (Signed)
Patient up to phase 11, voided. Patient still in severe pain. Dr. Lovett SoxAnywanwu called. Orders received. Will see patient.

## 2016-07-31 NOTE — H&P (Signed)
Obstetrics and Gynecology Visit Pre Op H&P   Date: 07/31/2016  OB[facility]  Chief Complaint: AUB for surgery  Interval History: No issues.    History of Present Illness: Shirley AlleyLinda A [name] is a [age] y.o. African-American G5P4. (Patient's last menstrual period was 04/02/2016.), seen for the above chief complaint. Her past medical history is significant for h/o c-section x 4, abdominal hernia repair, HTN, tobacco use, anemia (Hct 29 on 4/7). Patient last seen by me on 11/1. At that time, options reviewed with her and she elected for endometrial ablation; she was still having what seems like are two periods a month that last for about 7 days and not really any different with the megace which she takes once per day; somewhat painful and crampy. Pt last seen on 7/31 and had negative EMBx (benign scant endometrium) with prior negative pap smear this year. She also had VV atrophy but had negative climateric s/s and had a negative lab work up then.   Patient seen approximately 1 month ago but Dr. Macon LargeAnyanwu for AUB and had a normal u/s and then a pap smear and HPV testing at that visit.  She states s/s started about 4 months ago and consists of two periods per month that lasts about a 7-10d in length and are separated by about a week in between and has cramping with them. She has ?rare hot flashes but denies any vaginal dryness or night sweats.  At her visit a month ago, she was on a pill which she cant remember and wasn't stated in the noted but she was put on megace 40 bid at that time, with no change in her s/s.    Review of Systems: as per HPI  Past Medical History:  Past Medical History:  Diagnosis Date  . Anemia   . Blood dyscrasia    has to apply a lot of pressure to stop bleeding  . GERD (gastroesophageal reflux disease)   . Heart murmur    with pregnancy  . History of hiatal hernia   . Hypertension    4-5 years ago, no meds  . Kidney infection    patient  states I might have a kidney infection  . Migraine    migraines    Past Surgical History:  Past Surgical History:  Procedure Laterality Date  . ABDOMINAL HERNIA REPAIR    . CESAREAN SECTION     x 4  . CESAREAN SECTION     x 4    Past Obstetrical History:  OB History  Gravida Para Term Preterm AB Living  5 4 3 1 1 4   SAB TAB Ectopic Multiple Live Births               # Outcome Date GA Lbr Len/2nd Weight Sex Delivery Anes PTL Lv  5 Term           4 Term           3 Term           2 Preterm           1 AB             Obstetric Comments  c--section x 4. 36wk c-section x 1   Past Gynecological History: As per HPI.  Social History:  Social History   Social History  . Marital status: Legally Separated    Spouse name: N/A  . Number of children: N/A  . Years of education: N/A  Occupational History  . Not on file.   Social History Main Topics  . Smoking status: Current Every Day Smoker    Packs/day: 0.50    Types: Cigarettes  . Smokeless tobacco: Never Used  . Alcohol use No     Comment: occasionally  . Drug use: No  . Sexual activity: Yes    Birth control/ protection: None   Other Topics Concern  . Not on file   Social History Narrative  . No narrative on file    Family History:  Family History  Problem Relation Age of Onset  . Hypertension Mother   . Colon cancer Father [id number]    Medications Iron  Allergies Aspirin and Ibuprofen   Physical Exam:  BP (!) 150/96   Pulse 82   Temp 98.7 F (37.1 C) (Oral)   Resp 16   Ht 5' 2.5" (1.588 m)   Wt 134 lb (60.8 kg)   LMP 07/07/2016 (Exact Date)   SpO2 100%   BMI 24.12 kg/m  Body mass index is 24.12 kg/m. General appearance: Well nourished, well developed female in no acute distress.  Normal s1 and s2, no MRGs CTAB  Laboratory: as above UPT pending CBC Latest Ref Rng & Units 07/11/2016 12/16/2015 10/17/2015  WBC 3.8 - 10.8 K/uL 5.2 5.6 4.8  Hemoglobin 11.7 - 15.5 g/dL 16.112.9 0.9(U9.5(L) 0.4(V9.6(L)   Hematocrit 35.0 - 45.0 % 38.9 29.1(L) 28.8(L)  Platelets 140 - 400 K/uL 353 547(H) 410(H)    Radiology: see prior notes  Assessment: pt stable with continued AUB  Plan:  D/w pt again re: meds vs surgical. H/o IUD that was embedded and depo in the past and would not like to try those again. Has failed progestin only pills and not a candidate for combined OCPs given her HTN.  Given this, d/w her re: ablation vs definitive procedure. With ablation, d/w her that mirena and ablation appear to have equivalent long term results with goal for regular periods and not necessarily amenorrhea with one being more immediate. Risks of ablation d/w her, including possible difficulty dx malignancy in the future, recurrence risk and risk of injury to surrounding structures, especially wit her h/o multiple LTCS incisions. Also d/w her that procedure just may not work and may need hyst for bleeding control anyways and her need [facility]. Risks of surgery d/w her, particularly in light of prior c-sections and lack of mobility on prior bimanual making adhesions more likely.    Pt states husband is to get vasectomy and and she would like to move forward with the ablation today.   Will need to confirm with patient in future visits if she was able to find a PCP for routine medical care.   RTC 4wks for post op visit   Cornelia Copaharlie Brittnei Jagiello, Jr MD Attending [facility](Faculty Practice)

## 2016-07-31 NOTE — Anesthesia Preprocedure Evaluation (Signed)
Anesthesia Evaluation  Patient identified by MRN, date of birth, ID band Patient awake    Reviewed: Allergy & Precautions, H&P , NPO status , Patient's Chart, lab work & pertinent test results, reviewed documented beta blocker date and time   Airway Mallampati: I  TM Distance: >3 FB Neck ROM: full    Dental no notable dental hx. (+) Teeth Intact   Pulmonary asthma , Current Smoker,    Pulmonary exam normal        Cardiovascular hypertension, Normal cardiovascular exam     Neuro/Psych negative psych ROS   GI/Hepatic Neg liver ROS, GERD  Medicated and Controlled,  Endo/Other  negative endocrine ROS  Renal/GU      Musculoskeletal   Abdominal Normal abdominal exam  (+)   Peds  Hematology   Anesthesia Other Findings   Reproductive/Obstetrics negative OB ROS                             Anesthesia Physical Anesthesia Plan  ASA: II  Anesthesia Plan: General   Post-op Pain Management:    Induction: Intravenous  Airway Management Planned: LMA  Additional Equipment:   Intra-op Plan:   Post-operative Plan:   Informed Consent: I have reviewed the patients History and Physical, chart, labs and discussed the procedure including the risks, benefits and alternatives for the proposed anesthesia with the patient or authorized representative who has indicated his/her understanding and acceptance.     Plan Discussed with: CRNA, Surgeon and Anesthesiologist  Anesthesia Plan Comments:         Anesthesia Quick Evaluation

## 2016-07-31 NOTE — Transfer of Care (Signed)
Immediate Anesthesia Transfer of Care Note  Patient: Shirley AlleyLinda A [name]  Procedure(s) Performed: Procedure(s): HYSTEROSCOPY WITH HYDROTHERMAL ABLATION  Patient Location: PACU  Anesthesia Type:General  Level of Consciousness: awake, alert  and oriented  Airway & Oxygen Therapy: Patient Spontanous Breathing and Patient connected to nasal cannula oxygen  Post-op Assessment: Report given to RN, Post -op Vital signs reviewed and stable and Patient moving all extremities  Post vital signs: Reviewed and stable  Last Vitals:  Vitals:   07/31/16 1135  BP: (!) 150/96  Pulse: 82  Resp: 16  Temp: 37.1 C    Last Pain:  Vitals:   07/31/16 1135  TempSrc: Oral      Patients Stated Pain Goal: 3 (07/31/16 1135)  Complications: No apparent anesthesia complications

## 2016-08-01 ENCOUNTER — Encounter (HOSPITAL_COMMUNITY): Payer: Self-pay | Admitting: Obstetrics and Gynecology

## 2016-08-01 DIAGNOSIS — G8918 Other acute postprocedural pain: Secondary | ICD-10-CM | POA: Diagnosis not present

## 2016-08-01 DIAGNOSIS — N921 Excessive and frequent menstruation with irregular cycle: Secondary | ICD-10-CM | POA: Diagnosis not present

## 2016-08-01 LAB — CBC
HCT: 31.4 % — ABNORMAL LOW (ref 36.0–46.0)
Hemoglobin: 11 g/dL — ABNORMAL LOW (ref 12.0–15.0)
MCH: 32.3 pg (ref 26.0–34.0)
MCHC: 35 g/dL (ref 30.0–36.0)
MCV: 92.1 fL (ref 78.0–100.0)
Platelets: 263 10*3/uL (ref 150–400)
RBC: 3.41 MIL/uL — ABNORMAL LOW (ref 3.87–5.11)
RDW: 13.6 % (ref 11.5–15.5)
WBC: 5.7 10*3/uL (ref 4.0–10.5)

## 2016-08-01 MED ORDER — HYDROMORPHONE HCL 2 MG PO TABS: 2.0000 mg | ORAL_TABLET | ORAL | 0 refills | Status: DC | PRN

## 2016-08-01 MED ORDER — LISINOPRIL 20 MG PO TABS: 20.0000 mg | ORAL_TABLET | Freq: Every day | ORAL | 3 refills | Status: DC

## 2016-08-01 NOTE — Discharge Summary (Signed)
Gynecology Physician Postoperative Discharge Summary  Patient ID: Shirley AlleyLinda A [name] MRN: [id number] DOB/AGE: [birth year] 40 y.o.  Admit Date: 07/31/2016 Discharge Date: 08/01/2016  Admission and Discharge Diagnoses: Severe postoperative pain after HTA, Hypertension  Procedures: Procedure(s): HYSTEROSCOPY WITH HYDROTHERMAL ABLATION  Hospital Course:  Shirley AlleyLinda A [name] is a 40 y.o. [id number] admitted for pain control and observation after having severe pain in the PACU after HTA. Pain was not controlled on multiple medications. She ended up receiving multiple narcotics and Toradol which finally helped with her pain. She had no signs of operative injury and remained stable overnight.  By time of discharge on POD#1, her pain was controlled on oral pain medications; she was ambulating, voiding without difficulty and tolerating regular diet. She was deemed stable for discharge to home.   Significant Labs: CBC Latest Ref Rng & Units 08/01/2016 07/11/2016 12/16/2015  WBC 4.0 - 10.5 K/uL 5.7 5.2 5.6  Hemoglobin 12.0 - 15.0 g/dL 11.0(L) 12.9 9.5(L)  Hematocrit 36.0 - 46.0 % 31.4(L) 38.9 29.1(L)  Platelets 150 - 400 K/uL 263 353 547(H)    Discharge Exam: Blood pressure 134/88, pulse 76, temperature 99.2 F (37.3 C), temperature source Oral, resp. rate 14, height 5' 2.5" (1.588 m), weight 134 lb (60.8 kg), last menstrual period 07/07/2016, SpO2 100 %. General appearance: alert and no distress  Resp: clear to auscultation bilaterally  Cardio: regular rate and rhythm  GI: soft, mild lower abdominal tenderness; bowel sounds normal; no masses, no organomegaly.  Incision: C/D/I, no erythema, no drainage noted Pelvic: scant blood on pad  Extremities: extremities normal, atraumatic, no cyanosis or edema and Homans sign is negative, no sign of DVT  Discharged Condition: Stable  Disposition: 01-Home or Self Care  Discharge Instructions    Discharge patient    Complete by:  As directed        Medication  List    STOP taking these medications   megestrol 40 MG tablet Commonly known as:  MEGACE     TAKE these medications   acetaminophen 500 MG tablet Commonly known as:  TYLENOL Take 1,000 mg by mouth every 6 (six) hours as needed for moderate pain or headache.   albuterol 108 (90 Base) MCG/ACT inhaler Commonly known as:  PROVENTIL HFA;VENTOLIN HFA Inhale 2 puffs into the lungs every 6 (six) hours as needed for wheezing or shortness of breath.   diphenhydrAMINE 25 MG tablet Commonly known as:  BENADRYL Take 25 mg by mouth every 6 (six) hours as needed for allergies.   FUSION PLUS Caps Take 1 capsule by mouth daily.   HYDROmorphone 2 MG tablet Commonly known as:  DILAUDID Take 1 tablet (2 mg total) by mouth every 4 (four) hours as needed for severe pain.   lisinopril 20 MG tablet Commonly known as:  PRINIVIL,ZESTRIL Take 1 tablet (20 mg total) by mouth daily.   omeprazole 20 MG capsule Commonly known as:  PRILOSEC Take 1 capsule (20 mg total) by mouth daily. What changed:  when to take this   ondansetron 8 MG disintegrating tablet Commonly known as:  ZOFRAN-ODT Take 8 mg by mouth every 8 (eight) hours as needed for nausea or vomiting.   sucralfate 1 g tablet Commonly known as:  CARAFATE Take 1 g by mouth 2 (two) times daily.   traMADol 50 MG tablet Commonly known as:  ULTRAM Take 1 tablet (50 mg total) by mouth every 6 (six) hours as needed for severe pain.      Follow-up Information  Wallace Bingharlie Pickens, MD Follow up in 4 week(s).   Specialty:  Obstetrics and Gynecology Why:  post operative visit Contact information: [address] [place] [address] [phone]           Signed:  Jaynie CollinsUGONNA  Jaeden [place]bay, MD, FACOG Attending Obstetrician & Gynecologist Faculty Practice, [facility]

## 2016-08-06 ENCOUNTER — Inpatient Hospital Stay (HOSPITAL_COMMUNITY)
Admission: AD | Admit: 2016-08-06 | Discharge: 2016-08-06 | Disposition: A | Discharge status: Home / Self Care | Payer: BLUE CROSS/BLUE SHIELD | Source: Ambulatory Visit | Attending: Obstetrics and Gynecology | Admitting: Obstetrics and Gynecology

## 2016-08-06 ENCOUNTER — Encounter (HOSPITAL_COMMUNITY): Payer: Self-pay | Admitting: *Deleted

## 2016-08-06 DIAGNOSIS — K5900 Constipation, unspecified: Secondary | ICD-10-CM | POA: Insufficient documentation

## 2016-08-06 DIAGNOSIS — Z79899 Other long term (current) drug therapy: Secondary | ICD-10-CM | POA: Insufficient documentation

## 2016-08-06 DIAGNOSIS — G8918 Other acute postprocedural pain: Secondary | ICD-10-CM | POA: Insufficient documentation

## 2016-08-06 DIAGNOSIS — F17210 Nicotine dependence, cigarettes, uncomplicated: Secondary | ICD-10-CM | POA: Insufficient documentation

## 2016-08-06 DIAGNOSIS — K5901 Slow transit constipation: Secondary | ICD-10-CM

## 2016-08-06 DIAGNOSIS — Z9889 Other specified postprocedural states: Secondary | ICD-10-CM | POA: Insufficient documentation

## 2016-08-06 LAB — CBC WITH DIFFERENTIAL/PLATELET
Basophils Absolute: 0 10*3/uL (ref 0.0–0.1)
Basophils Relative: 0 %
Eosinophils Absolute: 0.1 10*3/uL (ref 0.0–0.7)
Eosinophils Relative: 3 %
HCT: 35 % — ABNORMAL LOW (ref 36.0–46.0)
Hemoglobin: 12.1 g/dL (ref 12.0–15.0)
Lymphocytes Relative: 32 %
Lymphs Abs: 1.2 10*3/uL (ref 0.7–4.0)
MCH: 31.9 pg (ref 26.0–34.0)
MCHC: 34.6 g/dL (ref 30.0–36.0)
MCV: 92.3 fL (ref 78.0–100.0)
Monocytes Absolute: 0.1 10*3/uL (ref 0.1–1.0)
Monocytes Relative: 4 %
Neutro Abs: 2.3 10*3/uL (ref 1.7–7.7)
Neutrophils Relative %: 61 %
Platelets: 308 10*3/uL (ref 150–400)
RBC: 3.79 MIL/uL — ABNORMAL LOW (ref 3.87–5.11)
RDW: 13.7 % (ref 11.5–15.5)
WBC: 3.7 10*3/uL — ABNORMAL LOW (ref 4.0–10.5)

## 2016-08-06 LAB — URINE MICROSCOPIC-ADD ON

## 2016-08-06 LAB — URINALYSIS, ROUTINE W REFLEX MICROSCOPIC
Bilirubin Urine: NEGATIVE
Glucose, UA: NEGATIVE mg/dL
Ketones, ur: NEGATIVE mg/dL
Nitrite: NEGATIVE
Protein, ur: NEGATIVE mg/dL
Specific Gravity, Urine: 1.02 (ref 1.005–1.030)
pH: 6 (ref 5.0–8.0)

## 2016-08-06 MED ORDER — POLYETHYLENE GLYCOL 3350 17 GM/SCOOP PO POWD: ORAL | 0 refills | Status: DC

## 2016-08-06 MED ORDER — HYDROMORPHONE HCL 1 MG/ML IJ SOLN: 1.0000 mg | Freq: Once | INTRAMUSCULAR | Status: DC

## 2016-08-06 MED ORDER — BISACODYL 10 MG RE SUPP
10.0000 mg | Freq: Once | RECTAL | Status: AC
  Administered 2016-08-06: 10 mg via RECTAL
  Filled 2016-08-06: qty 1

## 2016-08-06 MED ORDER — HYDROMORPHONE HCL 1 MG/ML IJ SOLN
1.0000 mg | Freq: Once | INTRAMUSCULAR | Status: AC
  Administered 2016-08-06: 1 mg via INTRAMUSCULAR
  Filled 2016-08-06: qty 1

## 2016-08-06 NOTE — Discharge Instructions (Signed)
Constipation, Adult °Constipation is when a person: °· Poops (has a bowel movement) fewer times in a week than normal. °· Has a hard time pooping. °· Has poop that is dry, hard, or bigger than normal. ° °Follow these instructions at home: °Eating and drinking ° °· Eat foods that have a lot of fiber, such as: °? Fresh fruits and vegetables. °? Whole grains. °? Beans. °· Eat less of foods that are high in fat, low in fiber, or overly processed, such as: °? French fries. °? Hamburgers. °? Cookies. °? Candy. °? Soda. °· Drink enough fluid to keep your pee (urine) clear or pale yellow. °General instructions °· Exercise regularly or as told by your doctor. °· Go to the restroom when you feel like you need to poop. Do not hold it in. °· Take over-the-counter and prescription medicines only as told by your doctor. These include any fiber supplements. °· Do pelvic floor retraining exercises, such as: °? Doing deep breathing while relaxing your lower belly (abdomen). °? Relaxing your pelvic floor while pooping. °· Watch your condition for any changes. °· Keep all follow-up visits as told by your doctor. This is important. °Contact a doctor if: °· You have pain that gets worse. °· You have a fever. °· You have not pooped for 4 days. °· You throw up (vomit). °· You are not hungry. °· You lose weight. °· You are bleeding from the anus. °· You have thin, pencil-like poop (stool). °Get help right away if: °· You have a fever, and your symptoms suddenly get worse. °· You leak poop or have blood in your poop. °· Your belly feels hard or bigger than normal (is bloated). °· You have very bad belly pain. °· You feel dizzy or you faint. °This information is not intended to replace advice given to you by your health care provider. Make sure you discuss any questions you have with your health care provider. °Document Released: 02/13/2008 Document Revised: 03/16/2016 Document Reviewed: 02/15/2016 °Elsevier Interactive Patient Education ©  2017 [employer]. ° °

## 2016-08-06 NOTE — MAU Note (Signed)
States she was told it would hurt for only a couple of days. States she was given rx for pain med but has taken them all.

## 2016-08-06 NOTE — MAU Note (Signed)
Small BM after suppository.

## 2016-08-06 NOTE — MAU Note (Signed)
Has passed more stool and pain level is down to 4 after pain med. Feels much better.

## 2016-08-06 NOTE — MAU Note (Signed)
Pt presents to MAU with complaints of lower abdominal pain since her surgery on 07/31/16 ablation by Dr Vergie LivingPickens. Denies any vaginal bleeding or abnormal discharge. Reports liquid discharge from surgery.

## 2016-08-06 NOTE — MAU Provider Note (Signed)
History     CSN: [id number]  Arrival date and time: 08/06/16 1036   First Provider Initiated Contact with Patient 08/06/16 1136      Chief Complaint  Patient presents with  . Pelvic Pain   HPI [name] is a 40 y.o. [id number] POD #6 s/p hysteroscopy with hydrothermal ablation who presents to MAU today with complaint of abdominal pain. The patient states that this pain has been constant since the surgery. She has only taken Tylenol for pain as Ibuprofen and ASA cause issues with her gastric ulcer. She states last dose at 0500 today without relief. She called the office, but could not be seen today. She rates her pain at 7/10 now in the lower abdomen. She is having a small amount of fluid discharge with a brown tinge. She denies heavy bleeding. She had N/V over the weekend, but none today. She denies diarrhea or constipation, but states last BM was prior to her surgery.   OB History    Gravida Para Term Preterm AB Living   5 4 3 1 1 4    SAB TAB Ectopic Multiple Live Births                  Obstetric Comments   c--section x 4. 36wk c-section x 1      Past Medical History:  Diagnosis Date  . Anemia   . Blood dyscrasia    has to apply a lot of pressure to stop bleeding  . GERD (gastroesophageal reflux disease)   . Heart murmur    with pregnancy  . History of hiatal hernia   . Hypertension    4-5 years ago, no meds  . Kidney infection    patient states I might have a kidney infection  . Migraine    migraines    Past Surgical History:  Procedure Laterality Date  . ABDOMINAL HERNIA REPAIR    . CESAREAN SECTION     x 4  . CESAREAN SECTION     x 4  . ENDOMETRIAL ABLATION  07/31/2016  . HYSTEROSCOPY  07/31/2016   Procedure: HYSTEROSCOPY WITH HYDROTHERMAL ABLATION;  Surgeon: [place] Bing, MD;  Location: [facility];  Service: Gynecology;;    Family History  Problem Relation Age of Onset  . Hypertension Mother   . Colon cancer Father 66    Social History   Substance Use Topics  . Smoking status: Current Every Day Smoker    Packs/day: 0.50    Types: Cigarettes  . Smokeless tobacco: Never Used  . Alcohol use No     Comment: occasionally    Allergies:  Allergies  Allergen Reactions  . Aspirin Nausea Only    Makes stomach burn  . Ibuprofen Nausea Only    Makes stomach burn    Prescriptions Prior to Admission  Medication Sig Dispense Refill Last Dose  . acetaminophen (TYLENOL) 500 MG tablet Take 1,000 mg by mouth every 6 (six) hours as needed for moderate pain or headache.   08/06/2016 at Unknown time  . docusate sodium (COLACE) 100 MG capsule Take 100 mg by mouth 3 (three) times daily as needed for mild constipation.   08/03/2016  . Iron-FA-B Cmp-C-Biot-Probiotic (FUSION PLUS) CAPS Take 1 capsule by mouth daily.   08/05/2016 at Unknown time  . lisinopril (PRINIVIL,ZESTRIL) 20 MG tablet Take 1 tablet (20 mg total) by mouth daily. 30 tablet 3 08/05/2016 at Unknown time  . omeprazole (PRILOSEC) 20 MG capsule Take 1 capsule (20 mg total)  by mouth daily. (Patient taking differently: Take 20 mg by mouth 2 (two) times daily before a meal. ) 30 capsule 0 08/05/2016 at Unknown time  . albuterol (PROVENTIL HFA;VENTOLIN HFA) 108 (90 Base) MCG/ACT inhaler Inhale 2 puffs into the lungs every 6 (six) hours as needed for wheezing or shortness of breath.   07/31/2016  . diphenhydrAMINE (BENADRYL) 25 MG tablet Take 25 mg by mouth every 6 (six) hours as needed for allergies.   07/29/2016  . HYDROmorphone (DILAUDID) 2 MG tablet Take 1 tablet (2 mg total) by mouth every 4 (four) hours as needed for severe pain. 30 tablet 0 08/05/2016  . ondansetron (ZOFRAN-ODT) 8 MG disintegrating tablet Take 8 mg by mouth every 8 (eight) hours as needed for nausea or vomiting.   08/03/2016  . sucralfate (CARAFATE) 1 g tablet Take 1 g by mouth 2 (two) times daily.   08/05/2016  . traMADol (ULTRAM) 50 MG tablet Take 1 tablet (50 mg total) by mouth every 6 (six) hours as needed  for severe pain. (Patient not taking: Reported on 07/19/2016) 60 tablet 0 Not Taking at Unknown time    Review of Systems  Constitutional: Negative for fever and malaise/fatigue.  Gastrointestinal: Positive for abdominal pain, constipation, nausea and vomiting. Negative for diarrhea.  Genitourinary: Negative for dysuria, frequency and urgency.       + discharge   Physical Exam   Blood pressure 133/79, pulse 75, temperature 99.1 F (37.3 C), resp. rate 16, weight 134 lb (60.8 kg), last menstrual period 07/07/2016.  Physical Exam  Nursing note and vitals reviewed. Constitutional: She is oriented to person, place, and time. She appears well-developed and well-nourished. No distress.  HENT:  Head: Normocephalic and atraumatic.  Cardiovascular: Normal rate.   Respiratory: Effort normal.  GI: Soft. She exhibits no distension and no mass. Bowel sounds are decreased. There is tenderness (moderate diffuse tenderness to palpation, worse in LLQ). There is no rebound and no guarding.  Neurological: She is alert and oriented to person, place, and time.  Skin: Skin is warm and dry. No erythema.  Psychiatric: She has a normal mood and affect.     Results for orders placed or performed during the hospital encounter of 08/06/16 (from the past 24 hour(s))  CBC with Differential/Platelet     Status: Abnormal   Collection Time: 08/06/16 10:51 AM  Result Value Ref Range   WBC 3.7 (L) 4.0 - 10.5 K/uL   RBC 3.79 (L) 3.87 - 5.11 MIL/uL   Hemoglobin 12.1 12.0 - 15.0 g/dL   HCT 16.135.0 (L) 09.636.0 - 04.546.0 %   MCV 92.3 78.0 - 100.0 fL   MCH 31.9 26.0 - 34.0 pg   MCHC 34.6 30.0 - 36.0 g/dL   RDW 40.913.7 81.111.5 - 91.415.5 %   Platelets 308 150 - 400 K/uL   Neutrophils Relative % 61 %   Neutro Abs 2.3 1.7 - 7.7 K/uL   Lymphocytes Relative 32 %   Lymphs Abs 1.2 0.7 - 4.0 K/uL   Monocytes Relative 4 %   Monocytes Absolute 0.1 0.1 - 1.0 K/uL   Eosinophils Relative 3 %   Eosinophils Absolute 0.1 0.0 - 0.7 K/uL    Basophils Relative 0 %   Basophils Absolute 0.0 0.0 - 0.1 K/uL  Urinalysis, Routine w reflex microscopic (not at [facility])     Status: Abnormal   Collection Time: 08/06/16 11:57 AM  Result Value Ref Range   Color, Urine YELLOW YELLOW   APPearance CLEAR CLEAR  Specific Gravity, Urine 1.020 1.005 - 1.030   pH 6.0 5.0 - 8.0   Glucose, UA NEGATIVE NEGATIVE mg/dL   Hgb urine dipstick SMALL (A) NEGATIVE   Bilirubin Urine NEGATIVE NEGATIVE   Ketones, ur NEGATIVE NEGATIVE mg/dL   Protein, ur NEGATIVE NEGATIVE mg/dL   Nitrite NEGATIVE NEGATIVE   Leukocytes, UA TRACE (A) NEGATIVE  Urine microscopic-add on     Status: Abnormal   Collection Time: 08/06/16 11:57 AM  Result Value Ref Range   Squamous Epithelial / LPF 0-5 (A) NONE SEEN   WBC, UA 0-5 0 - 5 WBC/hpf   RBC / HPF 0-5 0 - 5 RBC/hpf   Bacteria, UA FEW (A) NONE SEEN   Urine-Other MUCOUS PRESENT     MAU Course  Procedures None  MDM CBC today without evidence of infection Dulcolax suppository and 1 mg IM Dilaudid today  Patient states significant improvement in pain. Has had 2 BMs while in MAU.  Assessment and Plan  A: POD #6 s/p hysteroscopy with hydrothermal ablation Constipation  Post operative pain   P:  Discharge home Rx for Miralax sent to patient's pharmacy Increased PO hydration advised Warning signs for worsening condition discussed Patient advised to follow-up with [facility] as scheduled for routine post-operative appointment Patient may return to MAU as needed or if her condition were to change or worsen  Marny LowensteinJulie N Treena Cosman, PA-C  08/06/2016, 1:59 PM

## 2016-08-30 ENCOUNTER — Ambulatory Visit (INDEPENDENT_AMBULATORY_CARE_PROVIDER_SITE_OTHER): Payer: BLUE CROSS/BLUE SHIELD | Admitting: Obstetrics and Gynecology

## 2016-08-30 ENCOUNTER — Encounter: Payer: Self-pay | Admitting: Obstetrics and Gynecology

## 2016-08-30 VITALS — BP 187/110 | HR 91

## 2016-08-30 DIAGNOSIS — Z09 Encounter for follow-up examination after completed treatment for conditions other than malignant neoplasm: Secondary | ICD-10-CM

## 2016-08-30 DIAGNOSIS — N898 Other specified noninflammatory disorders of vagina: Secondary | ICD-10-CM

## 2016-08-30 NOTE — Progress Notes (Signed)
Obstetrics and Gynecology Visit Post Op Patient Evaluation  Appointment Date: 08/30/2016  Chief Complaint: post op follow up  History of Present Illness:  Shirley [name] is a 40 y.o. that had a 11/21 hysteroscopy HTA ablation for meno-metrorrhagia.    Patient denies any bleeding since the procedure and just some yellow brownish discharge. She did have period s/s (slight cramping and breast tenderness) about 10 days ago when she was due for a period. She intercourse since the procedure and slight dyspareunia with it. No s/s of HTN. Pt just took her lisinopril    Review of Systems:  her 12 point review of systems is negative or as noted in the History of Present Illness.  Medications and allergies: reviewed Physical Exam:  BP (!) 187/110   Pulse 91  There is no height or weight on file to calculate BMI. General appearance: Well nourished, well developed female in no acute distress.  Abdomen: diffusely non tender to palpation, non distended, and no masses, hernias Neuro/Psych:  Normal mood and affect.    Pelvic exam:  Mild slight yellow/brown d/c in vault. No active bleeding. Normal cervix (very anterior like during surgery), nttp and no uterine tenderness.    Assessment: pt doing well   Plan: D/w pt that likely having normal post op discharge and encouraging that s/s are much better that. I told her to give it a few more cycles to see how the ablation is going to truly effect her so will see back in about 6wks. Pt told that if doing great then that can cancel appt. Pt amenable to plan  Cornelia Copaharlie Breylan Lefevers, Jr MD Attending Center for [facility](Faculty Practice)

## 2016-10-11 ENCOUNTER — Ambulatory Visit: Payer: BLUE CROSS/BLUE SHIELD | Admitting: Obstetrics and Gynecology

## 2016-10-29 ENCOUNTER — Emergency Department (HOSPITAL_COMMUNITY)
Admission: EM | Admit: 2016-10-29 | Discharge: 2016-10-29 | Disposition: A | Discharge status: Home / Self Care | Payer: BLUE CROSS/BLUE SHIELD | Attending: Emergency Medicine | Admitting: Emergency Medicine

## 2016-10-29 ENCOUNTER — Encounter: Payer: Self-pay | Admitting: Emergency Medicine

## 2016-10-29 ENCOUNTER — Emergency Department (HOSPITAL_COMMUNITY): Payer: BLUE CROSS/BLUE SHIELD

## 2016-10-29 DIAGNOSIS — F17210 Nicotine dependence, cigarettes, uncomplicated: Secondary | ICD-10-CM | POA: Insufficient documentation

## 2016-10-29 DIAGNOSIS — R0602 Shortness of breath: Secondary | ICD-10-CM | POA: Diagnosis present

## 2016-10-29 DIAGNOSIS — Z79899 Other long term (current) drug therapy: Secondary | ICD-10-CM | POA: Diagnosis not present

## 2016-10-29 DIAGNOSIS — B9789 Other viral agents as the cause of diseases classified elsewhere: Secondary | ICD-10-CM

## 2016-10-29 DIAGNOSIS — I10 Essential (primary) hypertension: Secondary | ICD-10-CM | POA: Diagnosis not present

## 2016-10-29 DIAGNOSIS — J9801 Acute bronchospasm: Secondary | ICD-10-CM | POA: Diagnosis not present

## 2016-10-29 DIAGNOSIS — J069 Acute upper respiratory infection, unspecified: Secondary | ICD-10-CM | POA: Diagnosis not present

## 2016-10-29 LAB — CBC WITH DIFFERENTIAL/PLATELET
Basophils Absolute: 0 10*3/uL (ref 0.0–0.1)
Basophils Relative: 0 %
Eosinophils Absolute: 0.2 10*3/uL (ref 0.0–0.7)
Eosinophils Relative: 2 %
HCT: 37.5 % (ref 36.0–46.0)
Hemoglobin: 12.7 g/dL (ref 12.0–15.0)
Lymphocytes Relative: 25 %
Lymphs Abs: 1.9 10*3/uL (ref 0.7–4.0)
MCH: 31.7 pg (ref 26.0–34.0)
MCHC: 33.9 g/dL (ref 30.0–36.0)
MCV: 93.5 fL (ref 78.0–100.0)
Monocytes Absolute: 0.3 10*3/uL (ref 0.1–1.0)
Monocytes Relative: 4 %
Neutro Abs: 5.2 10*3/uL (ref 1.7–7.7)
Neutrophils Relative %: 69 %
Platelets: 317 10*3/uL (ref 150–400)
RBC: 4.01 MIL/uL (ref 3.87–5.11)
RDW: 15.5 % (ref 11.5–15.5)
WBC: 7.6 10*3/uL (ref 4.0–10.5)

## 2016-10-29 LAB — I-STAT CHEM 8, ED
BUN: 10 mg/dL (ref 6–20)
Calcium, Ion: 1.31 mmol/L (ref 1.15–1.40)
Chloride: 104 mmol/L (ref 101–111)
Creatinine, Ser: 0.7 mg/dL (ref 0.44–1.00)
Glucose, Bld: 98 mg/dL (ref 65–99)
HCT: 40 % (ref 36.0–46.0)
Hemoglobin: 13.6 g/dL (ref 12.0–15.0)
Potassium: 3.5 mmol/L (ref 3.5–5.1)
Sodium: 139 mmol/L (ref 135–145)
TCO2: 26 mmol/L (ref 0–100)

## 2016-10-29 LAB — I-STAT BETA HCG BLOOD, ED (MC, WL, AP ONLY): I-stat hCG, quantitative: <5 m[IU]/mL (ref <5)

## 2016-10-29 IMAGING — CR DG CHEST 2V
2 series · 2 of 2 positions shown · non-contrast
Comparison: [DATE].

CLINICAL DATA: Shortness of breath.

EXAM:
CHEST  2 VIEW

[w chest pa]
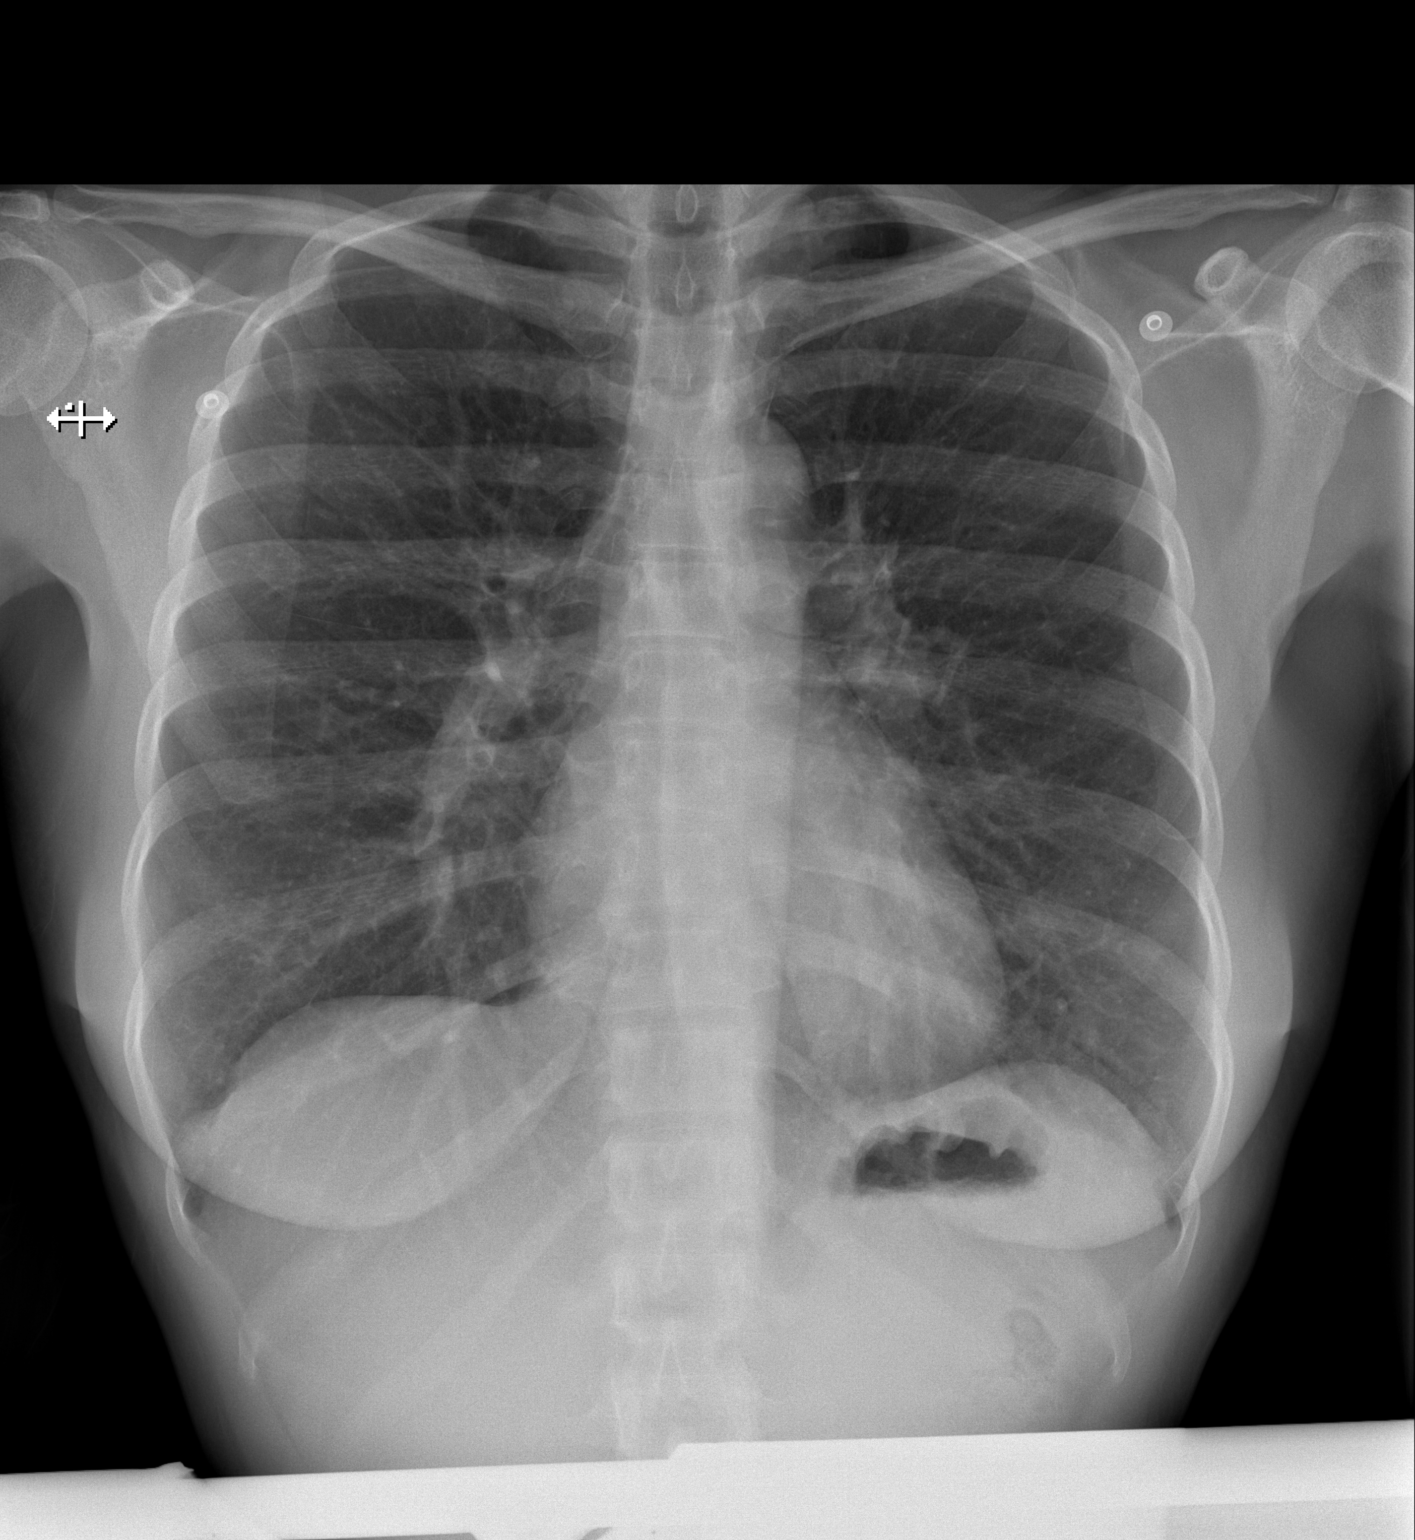

[w chest lat]
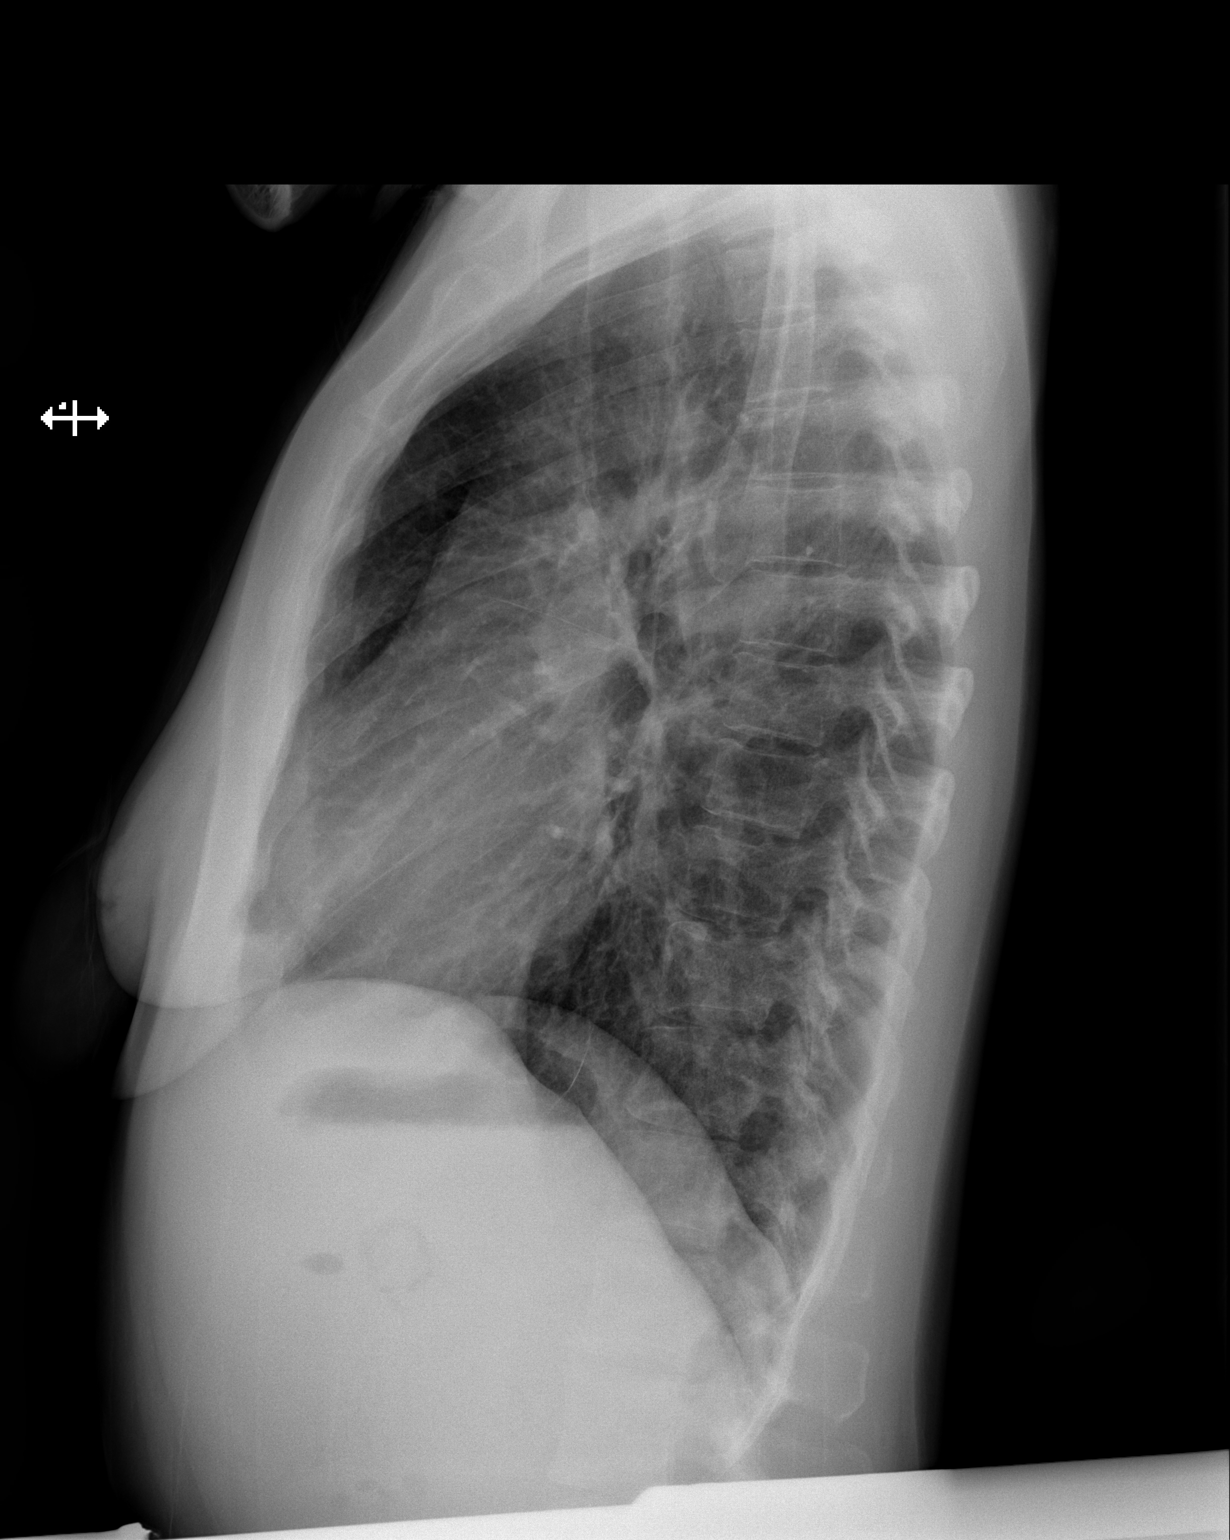

[2 of 2 positions shown; findings below may reference images not displayed]

FINDINGS: Mediastinum hilar structures are normal. Low lung volumes. No focal
infiltrate noted on today's exam No pleural effusion or
pneumothorax. Heart size normal.
IMPRESSION: Low lung volumes.  No focal infiltrate noted on today's exam.

## 2016-10-29 MED ORDER — PREDNISONE 20 MG PO TABS: 40.0000 mg | ORAL_TABLET | Freq: Every day | ORAL | 0 refills | Status: DC

## 2016-10-29 MED ORDER — BENZONATATE 100 MG PO CAPS: 100.0000 mg | ORAL_CAPSULE | Freq: Three times a day (TID) | ORAL | 0 refills | Status: DC

## 2016-10-29 MED ORDER — ACETAMINOPHEN 325 MG PO TABS
650.0000 mg | ORAL_TABLET | Freq: Once | ORAL | Status: AC
  Administered 2016-10-29: 650 mg via ORAL
  Filled 2016-10-29: qty 2

## 2016-10-29 MED ORDER — PREDNISONE 20 MG PO TABS
60.0000 mg | ORAL_TABLET | Freq: Once | ORAL | Status: AC
  Administered 2016-10-29: 60 mg via ORAL
  Filled 2016-10-29: qty 3

## 2016-10-29 MED ORDER — IPRATROPIUM-ALBUTEROL 0.5-2.5 (3) MG/3ML IN SOLN
3.0000 mL | Freq: Once | RESPIRATORY_TRACT | Status: AC
  Administered 2016-10-29: 3 mL via RESPIRATORY_TRACT
  Filled 2016-10-29: qty 3

## 2016-10-29 NOTE — ED Notes (Addendum)
Pt reports going to bed last night feeling fine. She woke up suddenly this morning with pain and tightness in her hand and feet. Shortly after she sts she felt as if she couldn't catch breath and felt like she was suffocating. Pt has hx of asthma so she used her rescue inhaler but didn't get any relief. Pt sts she never had anything like this happen before and doesn't think this is asthma related. Pt does report relief with breathing treatments.

## 2016-10-29 NOTE — ED Triage Notes (Signed)
Per EMS pt coming from home with c/o asthma exacerbation. Per EMS pt reports woke up suddenly with a headache and feeling short of breath, tried using inhaler which "didn't work" which caused her to start hyperventilating. Pt was given 10 mg albuterol and 0.5mg   atrovent en route with improvement of symptoms.

## 2016-10-29 NOTE — ED Provider Notes (Signed)
[facility]-EMERGENCY DEPT Provider Note   CSN: [id number] Arrival date & time: 10/29/16  0602     History   Chief Complaint Chief Complaint  Patient presents with  . Shortness of Breath  . Sore Throat    HPI Shirley [name] is a 41 y.o. female.  HPI Shirley [name] is a 41 y.o. female with hx of asthma, presents to ED with complaint of shortness of breath. Pt states she went to bed feeling well. States woke up this morning with fingers and feet swellig and states she could not breathe. Denies fever or chills. Reports myalgias. Tried to take an inhaler at home with no relief. Denies any sore throat or nasal congestion. Reports cough. Denies any vomiting or diarrhea, but did have some nausea earlier today. Denies any sick contacts. No other complaints.  Past Medical History:  Diagnosis Date  . Anemia   . Blood dyscrasia    has to apply a lot of pressure to stop bleeding  . GERD (gastroesophageal reflux disease)   . Heart murmur    with pregnancy  . History of hiatal hernia   . Hypertension    4-5 years ago, no meds  . Kidney infection    patient states I might have a kidney infection  . Migraine    migraines    Patient Active Problem List   Diagnosis Date Noted  . Acute postoperative pain 07/31/2016  . Menometrorrhagia 04/09/2016  . Absolute anemia 04/09/2016  . Abnormal uterine bleeding (AUB) 04/09/2016  . Essential hypertension 11/17/2009    Past Surgical History:  Procedure Laterality Date  . ABDOMINAL HERNIA REPAIR    . CESAREAN SECTION     x 4  . CESAREAN SECTION     x 4  . ENDOMETRIAL ABLATION  07/31/2016  . HYSTEROSCOPY  07/31/2016   Procedure: HYSTEROSCOPY WITH HYDROTHERMAL ABLATION;  Surgeon: [place] Bing, MD;  Location: [facility];  Service: Gynecology;;    OB History    Gravida Para Term Preterm AB Living   5 4 3 1 1 4    SAB TAB Ectopic Multiple Live Births                  Obstetric Comments   c--section x 4. 36wk c-section x 1       Home  Medications    Prior to Admission medications   Medication Sig Start Date End Date Taking? Authorizing Provider  albuterol (PROVENTIL HFA;VENTOLIN HFA) 108 (90 Base) MCG/ACT inhaler Inhale 2 puffs into the lungs every 6 (six) hours as needed for wheezing or shortness of breath.   Yes Historical Provider, MD  diphenhydrAMINE (BENADRYL) 25 MG tablet Take 25 mg by mouth every 6 (six) hours as needed for allergies.   Yes Historical Provider, MD  docusate sodium (COLACE) 100 MG capsule Take 100 mg by mouth 3 (three) times daily as needed for mild constipation.   Yes Historical Provider, MD  Iron-FA-B Cmp-C-Biot-Probiotic (FUSION PLUS) CAPS Take 1 capsule by mouth daily.   Yes Historical Provider, MD  lisinopril (PRINIVIL,ZESTRIL) 20 MG tablet Take 1 tablet (20 mg total) by mouth daily. 08/01/16  Yes Tereso Newcomer, MD  omeprazole (PRILOSEC) 20 MG capsule Take 1 capsule (20 mg total) by mouth daily. Patient taking differently: Take 20 mg by mouth 2 (two) times daily before a meal.  10/17/15  Yes Stevi Barrett, PA-C  ondansetron (ZOFRAN-ODT) 8 MG disintegrating tablet Take 8 mg by mouth every 8 (eight) hours as needed for nausea  or vomiting.   Yes Historical Provider, MD  sucralfate (CARAFATE) 1 g tablet Take 1 g by mouth 2 (two) times daily.   Yes Historical Provider, MD  acetaminophen (TYLENOL) 500 MG tablet Take 1,000 mg by mouth every 6 (six) hours as needed for moderate pain or headache.    Historical Provider, MD  HYDROmorphone (DILAUDID) 2 MG tablet Take 1 tablet (2 mg total) by mouth every 4 (four) hours as needed for severe pain. Patient not taking: Reported on 08/30/2016 08/01/16   Tereso Newcomer, MD  polyethylene glycol powder (MIRALAX) powder 1 capful daily until normal bowel movements resume Patient not taking: Reported on 10/29/2016 08/06/16   Marny Lowenstein, PA-C  traMADol (ULTRAM) 50 MG tablet Take 1 tablet (50 mg total) by mouth every 6 (six) hours as needed for severe pain. Patient not  taking: Reported on 08/30/2016 03/05/16   Tereso Newcomer, MD    Family History Family History  Problem Relation Age of Onset  . Hypertension Mother   . Colon cancer Father 3    Social History Social History  Substance Use Topics  . Smoking status: Current Every Day Smoker    Packs/day: 0.50    Types: Cigarettes  . Smokeless tobacco: Never Used  . Alcohol use No     Comment: occasionally     Allergies   Aspirin and Ibuprofen   Review of Systems Review of Systems  Constitutional: Positive for chills. Negative for fever.  HENT: Negative for congestion and sore throat.   Respiratory: Positive for cough and shortness of breath. Negative for chest tightness.   Cardiovascular: Positive for leg swelling. Negative for chest pain and palpitations.  Gastrointestinal: Negative for abdominal pain, diarrhea, nausea and vomiting.  Genitourinary: Negative for dysuria, flank pain, pelvic pain, vaginal bleeding, vaginal discharge and vaginal pain.  Musculoskeletal: Positive for arthralgias and myalgias. Negative for neck pain and neck stiffness.  Skin: Negative for rash.  Neurological: Positive for headaches. Negative for dizziness and weakness.  All other systems reviewed and are negative.    Physical Exam Updated Vital Signs BP 109/70   Pulse 105   Temp 100.2 F (37.9 C) (Oral)   Resp 20   Ht 5\' 2"  (1.575 m)   Wt 59 kg   SpO2 100%   BMI 23.78 kg/m   Physical Exam  Constitutional: She is oriented to person, place, and time. She appears well-developed and well-nourished. No distress.  HENT:  Head: Normocephalic and atraumatic.  Right Ear: Tympanic membrane, external ear and ear canal normal.  Left Ear: Tympanic membrane, external ear and ear canal normal.  Nose: Mucosal edema and rhinorrhea present.  Mouth/Throat: Uvula is midline and mucous membranes are normal. Posterior oropharyngeal erythema present. No oropharyngeal exudate, posterior oropharyngeal edema or tonsillar  abscesses.  Eyes: Conjunctivae are normal.  Neck: Neck supple.  Cardiovascular: Normal rate, regular rhythm, normal heart sounds and intact distal pulses.   Pulmonary/Chest: Effort normal and breath sounds normal. No respiratory distress. She has no wheezes. She has no rales.  Abdominal: She exhibits no distension.  Musculoskeletal: Normal range of motion.  Neurological: She is alert and oriented to person, place, and time.  Skin: Skin is warm and dry.  Psychiatric: She has a normal mood and affect.  Nursing note and vitals reviewed.    ED Treatments / Results  Labs (all labs ordered are listed, but only abnormal results are displayed) Labs Reviewed  CBC WITH DIFFERENTIAL/PLATELET  I-STAT CHEM 8, ED  I-STAT  BETA HCG BLOOD, ED ([facility], [facility], [facility] ONLY)    EKG  EKG Interpretation None       Radiology Dg Chest 2 View  Result Date: 10/29/2016 CLINICAL DATA:  Shortness of breath. EXAM: CHEST  2 VIEW COMPARISON:  12/23/2007. FINDINGS: Mediastinum hilar structures are normal. Low lung volumes. No focal infiltrate noted on today's exam No pleural effusion or pneumothorax. Heart size normal. IMPRESSION: Low lung volumes.  No focal infiltrate noted on today's exam. Electronically Signed   By: Maisie Fushomas  Register   On: 10/29/2016 07:17    Procedures Procedures (including critical care time)  Medications Ordered in ED Medications  acetaminophen (TYLENOL) tablet 650 mg (650 mg Oral Given 10/29/16 0705)  ipratropium-albuterol (DUONEB) 0.5-2.5 (3) MG/3ML nebulizer solution 3 mL (3 mLs Nebulization Given 10/29/16 0706)  predniSONE (DELTASONE) tablet 60 mg (60 mg Oral Given 10/29/16 0706)     Initial Impression / Assessment and Plan / ED Course  I have reviewed the triage vital signs and the nursing notes.  Pertinent labs & imaging results that were available during my care of the patient were reviewed by me and considered in my medical decision making (see chart for details).    Patient with  acute onset of chest pressure and shortness of breath, history of asthma, onset of symptoms this morning. Patient is noted to have a low-grade fever 100.2. Question viral infection. Lungs are clear, patient received one breathing treatment upon arrival to emergency department. We will treat with more breathing treatments, prednisone, Tylenol. Patient states she feels much better after breathing treatment. Will check labs given reported swelling in her hands and feet and chest x-ray.  7:54 AM Pregnancy test negative. CBC and chem 8 normal. Chest x-ray with no findings. Wheezing has resolved. Patient states she feels much better. Question viral URI with acute bronchospasm. Home with her inhaler, prednisone, Tessalon. Tylenol for fever. Follow-up as needed.  Vitals:   10/29/16 0612  BP: 109/70  Pulse: 105  Resp: 20  Temp: 100.2 F (37.9 C)     Final Clinical Impressions(s) / ED Diagnoses   Final diagnoses:  Bronchospasm, acute  Viral URI with cough    New Prescriptions New Prescriptions   BENZONATATE (TESSALON) 100 MG CAPSULE    Take 1 capsule (100 mg total) by mouth every 8 (eight) hours.   PREDNISONE (DELTASONE) 20 MG TABLET    Take 2 tablets (40 mg total) by mouth daily.     Jaynie Crumbleatyana Ansar Skoda, PA-C 10/29/16 [id number]    Alvira MondayErin Schlossman, MD 10/30/16 [phone]

## 2016-10-29 NOTE — Discharge Instructions (Signed)
Tylenol for fever/headache. Rest. No strenuous activity. Inhaler 2 puffs ever 3-4 hrs. Prednisone as prescribed. Cough medication as needed. Follow up with family doctor. Return if worsening.

## 2016-10-29 NOTE — ED Notes (Signed)
Bed: [facility]06 Expected date:  Expected time:  Means of arrival:  Comments: 41 yo F/ Shortness of breath- HA

## 2016-12-27 ENCOUNTER — Other Ambulatory Visit: Payer: Self-pay | Admitting: Obstetrics & Gynecology

## 2017-01-14 ENCOUNTER — Other Ambulatory Visit: Payer: Self-pay | Admitting: Obstetrics & Gynecology

## 2017-04-02 ENCOUNTER — Emergency Department (HOSPITAL_COMMUNITY): Payer: BLUE CROSS/BLUE SHIELD

## 2017-04-02 ENCOUNTER — Emergency Department (HOSPITAL_COMMUNITY)
Admission: EM | Admit: 2017-04-02 | Discharge: 2017-04-02 | Disposition: A | Discharge status: Home / Self Care | Payer: BLUE CROSS/BLUE SHIELD | Attending: Emergency Medicine | Admitting: Emergency Medicine

## 2017-04-02 ENCOUNTER — Encounter (HOSPITAL_COMMUNITY): Payer: Self-pay | Admitting: Emergency Medicine

## 2017-04-02 DIAGNOSIS — R110 Nausea: Secondary | ICD-10-CM | POA: Diagnosis not present

## 2017-04-02 DIAGNOSIS — K529 Noninfective gastroenteritis and colitis, unspecified: Secondary | ICD-10-CM | POA: Diagnosis not present

## 2017-04-02 DIAGNOSIS — F17210 Nicotine dependence, cigarettes, uncomplicated: Secondary | ICD-10-CM | POA: Diagnosis not present

## 2017-04-02 DIAGNOSIS — R197 Diarrhea, unspecified: Secondary | ICD-10-CM | POA: Diagnosis present

## 2017-04-02 DIAGNOSIS — Z79899 Other long term (current) drug therapy: Secondary | ICD-10-CM | POA: Diagnosis not present

## 2017-04-02 DIAGNOSIS — I10 Essential (primary) hypertension: Secondary | ICD-10-CM | POA: Diagnosis not present

## 2017-04-02 LAB — CBC
HCT: 39.5 % (ref 36.0–46.0)
Hemoglobin: 13.7 g/dL (ref 12.0–15.0)
MCH: 32.1 pg (ref 26.0–34.0)
MCHC: 34.7 g/dL (ref 30.0–36.0)
MCV: 92.5 fL (ref 78.0–100.0)
Platelets: 373 10*3/uL (ref 150–400)
RBC: 4.27 MIL/uL (ref 3.87–5.11)
RDW: 14.4 % (ref 11.5–15.5)
WBC: 5.2 10*3/uL (ref 4.0–10.5)

## 2017-04-02 LAB — URINALYSIS, ROUTINE W REFLEX MICROSCOPIC
Bilirubin Urine: NEGATIVE
Glucose, UA: NEGATIVE mg/dL
Ketones, ur: NEGATIVE mg/dL
Nitrite: NEGATIVE
Protein, ur: NEGATIVE mg/dL
Specific Gravity, Urine: 1.044 — ABNORMAL HIGH (ref 1.005–1.030)
pH: 7 (ref 5.0–8.0)

## 2017-04-02 LAB — COMPREHENSIVE METABOLIC PANEL
ALT: 21 U/L (ref 14–54)
AST: 29 U/L (ref 15–41)
Albumin: 4.7 g/dL (ref 3.5–5.0)
Alkaline Phosphatase: 58 U/L (ref 38–126)
Anion gap: 9 (ref 5–15)
BUN: 11 mg/dL (ref 6–20)
CO2: 26 mmol/L (ref 22–32)
Calcium: 9.3 mg/dL (ref 8.9–10.3)
Chloride: 105 mmol/L (ref 101–111)
Creatinine, Ser: 0.69 mg/dL (ref 0.44–1.00)
GFR calc Af Amer: >60 mL/min (ref >60)
GFR calc non Af Amer: >60 mL/min (ref >60)
Glucose, Bld: 98 mg/dL (ref 65–99)
Potassium: 3.3 mmol/L — ABNORMAL LOW (ref 3.5–5.1)
Sodium: 140 mmol/L (ref 135–145)
Total Bilirubin: 0.7 mg/dL (ref 0.3–1.2)
Total Protein: 8.3 g/dL — ABNORMAL HIGH (ref 6.5–8.1)

## 2017-04-02 LAB — I-STAT BETA HCG BLOOD, ED (MC, WL, AP ONLY): I-stat hCG, quantitative: <5 m[IU]/mL (ref <5)

## 2017-04-02 LAB — LIPASE, BLOOD: Lipase: 12 U/L (ref 11–51)

## 2017-04-02 IMAGING — CT CT ABD-PELV W/ CM
2 of 4 series · 16 of 46 positions shown, 18 images · IV contrast (ISOVUE)
Comparison: [DATE]

CLINICAL DATA: Nausea, emesis, diarrhea

EXAM:
CT ABDOMEN AND PELVIS WITH CONTRAST
TECHNIQUE: Multidetector CT imaging of the abdomen and pelvis was performed
using the standard protocol following bolus administration of
intravenous contrast.
CONTRAST:  100mL [ES] IOPAMIDOL ([ES]) INJECTION 61%

[Series 2: abd/pel with · axial · 0.69mm/px · z∈[+1107,+1487]mm · 13 of 86 slices shown, 15 images]
[im 5/86  soft-tissue]
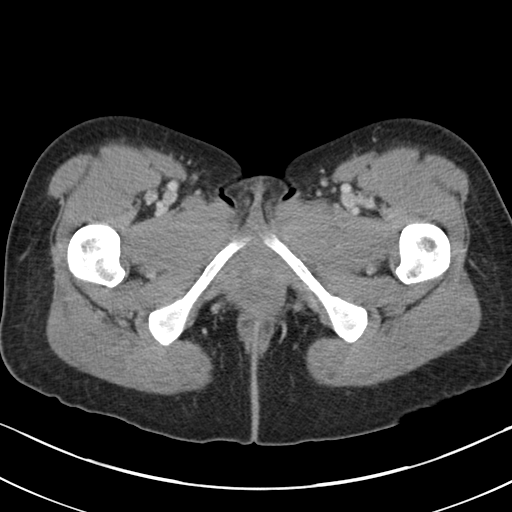
[im 5/86  bone]
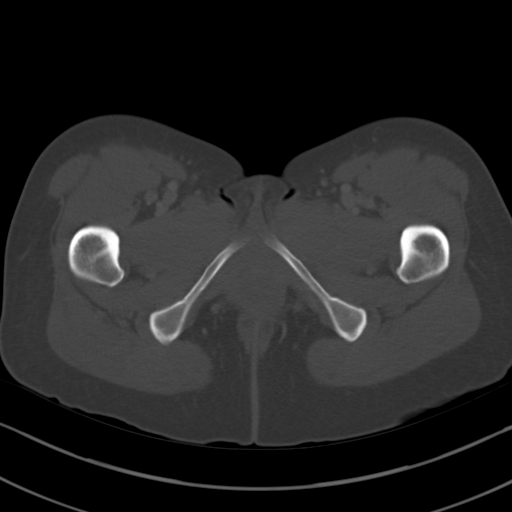
[im 10/86  soft-tissue]
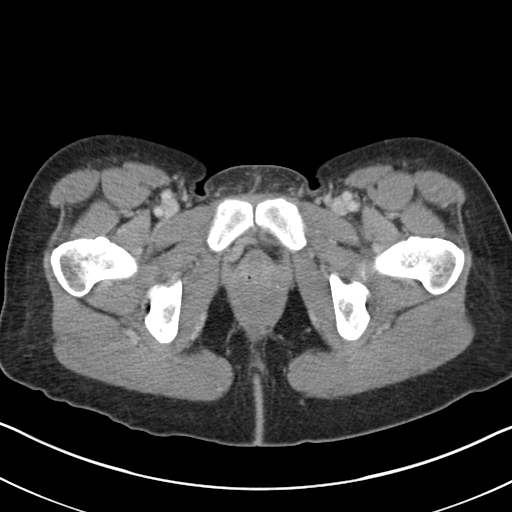
[im 19/86  soft-tissue]
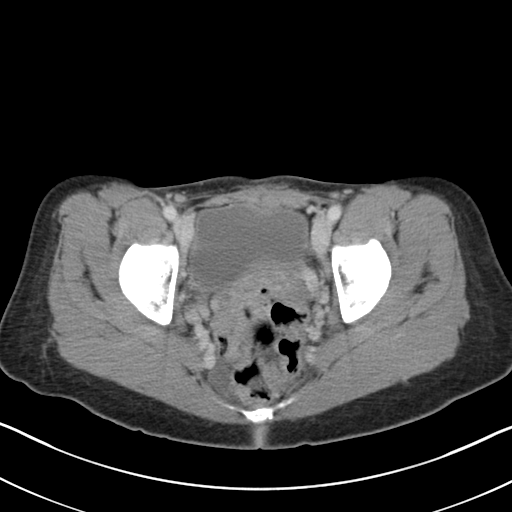
[im 24/86  soft-tissue]
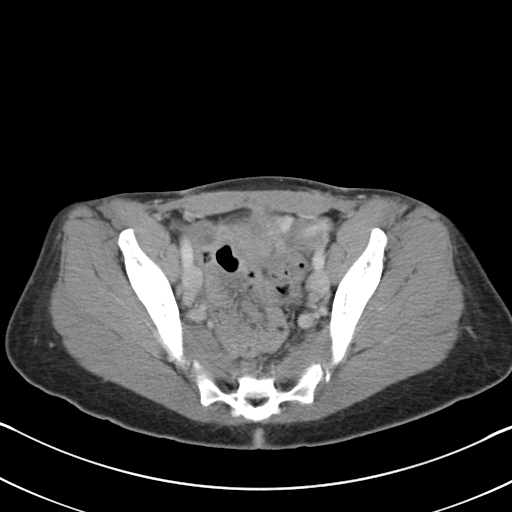
[im 29/86  soft-tissue]
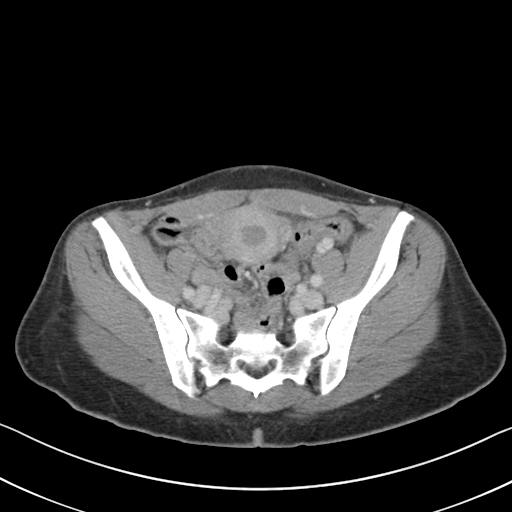
[im 38/86  soft-tissue]
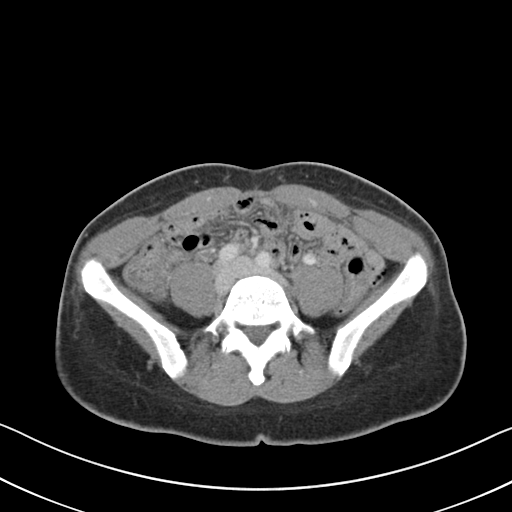
[im 43/86  soft-tissue]
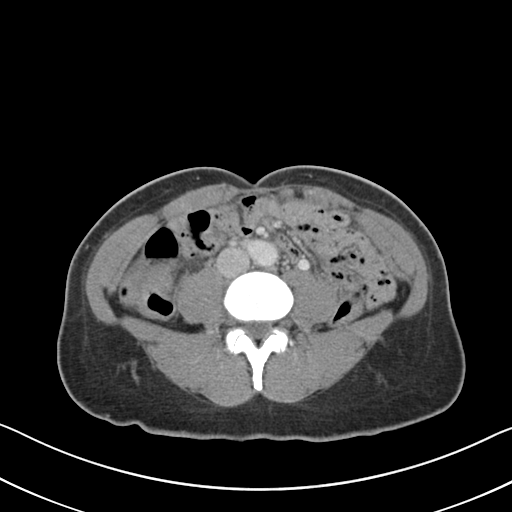
[im 48/86  soft-tissue]
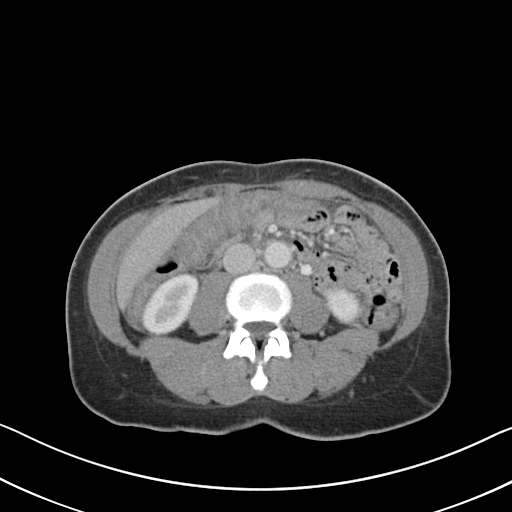
[im 57/86  soft-tissue]
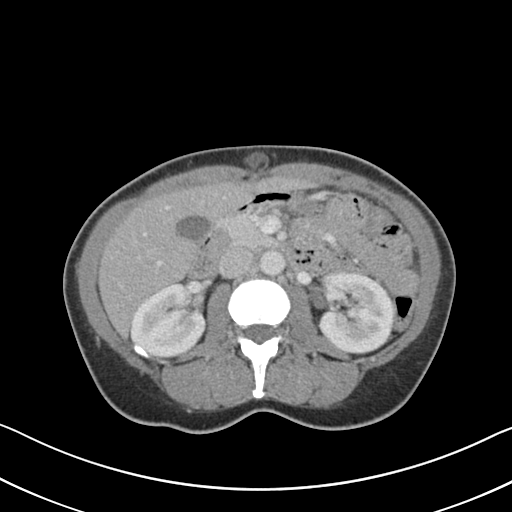
[im 57/86  bone]
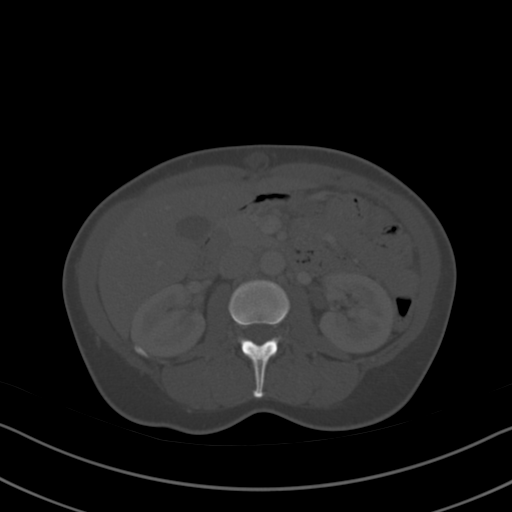
[im 62/86  soft-tissue]
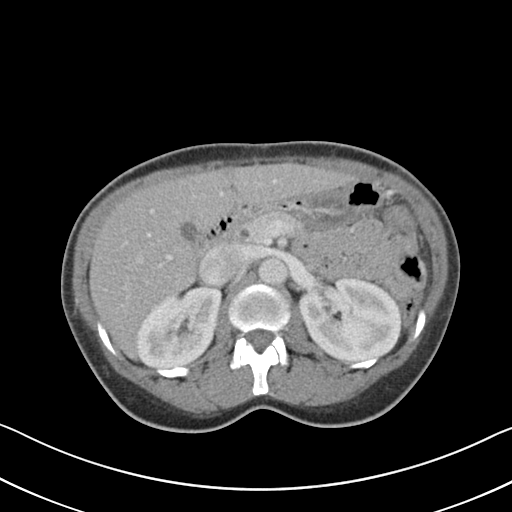
[im 67/86  soft-tissue]
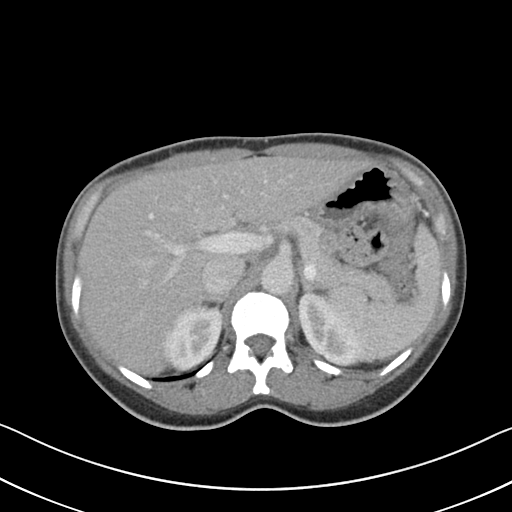
[im 76/86  soft-tissue]
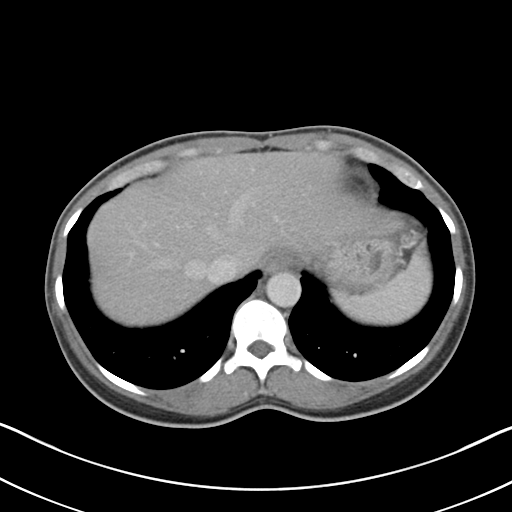
[im 81/86  soft-tissue]
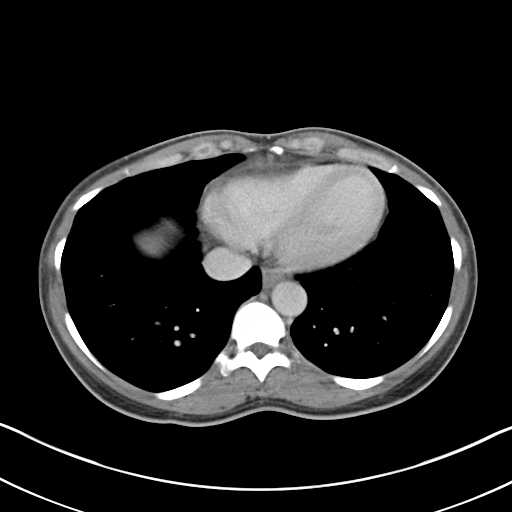

[Series 3: coronal a/|p · coronal · 0.72mm/px · 3 of 113 slices shown]
[im 38/113  soft-tissue]
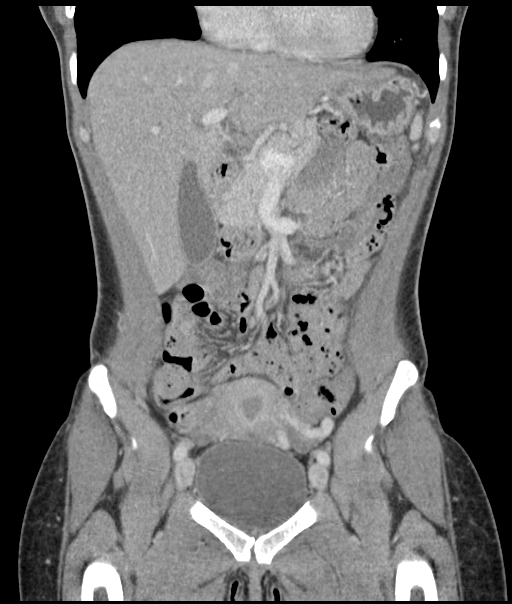
[im 50/113  soft-tissue]
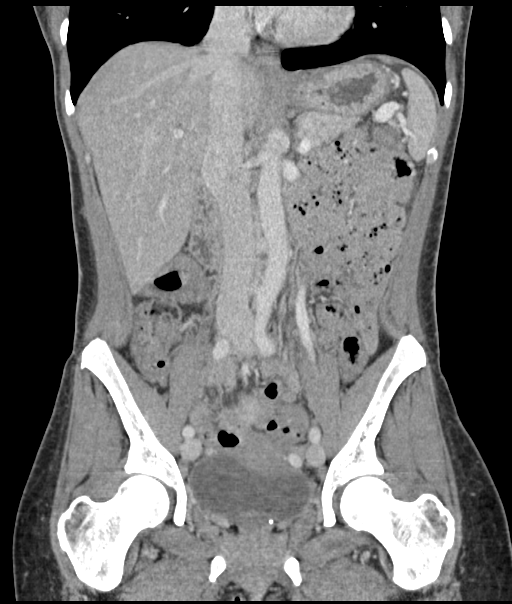
[im 63/113  soft-tissue]
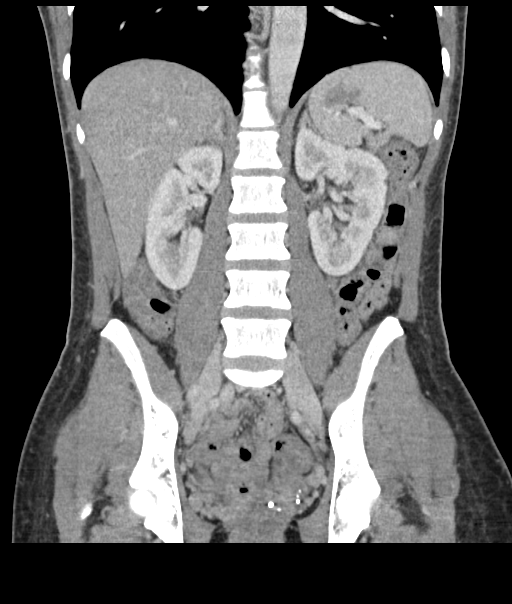

[16 of 46 positions shown; findings below may reference images not displayed]

FINDINGS: Lower chest: No acute abnormality.

Hepatobiliary: No focal liver abnormality is seen. No gallstones,
gallbladder wall thickening, or biliary dilatation.

Pancreas: Unremarkable. No pancreatic ductal dilatation or
surrounding inflammatory changes.

Spleen: Normal in size without focal abnormality.

Adrenals/Urinary Tract: Adrenal glands are unremarkable. Kidneys are
normal, without renal calculi, focal lesion, or hydronephrosis.
Bladder is unremarkable.

Stomach/Bowel: Stomach is within normal limits. Appendix appears
normal. Mild relative bowel wall thickening involving the ascending
colon and proximal transverse colon concerning for colitis secondary
to an infectious or inflammatory etiology. No pneumatosis,
pneumoperitoneum or portal venous gas.

Vascular/Lymphatic: No significant vascular findings are present. No
enlarged abdominal or pelvic lymph nodes.

Reproductive: Uterus and bilateral adnexa are unremarkable.

Other: Stable 12 mm hypodense nodule within the subcutaneous fat of
the anterior mid abdomen unchanged compared with [DATE] with a
underlying tiny ventral abdominal hernia.

Musculoskeletal: No acute or significant osseous findings.
IMPRESSION: 1. Mild relative bowel wall thickening involving the ascending colon
and proximal transverse colon concerning for colitis secondary to an
infectious or inflammatory etiology.
2. Stable 12 mm hypodense nodule within the subcutaneous fat of the
anterior mid abdomen unchanged compared with [DATE] with a
underlying tiny ventral abdominal hernia.

## 2017-04-02 MED ORDER — CIPROFLOXACIN HCL 500 MG PO TABS: 500.0000 mg | ORAL_TABLET | Freq: Two times a day (BID) | ORAL | 0 refills | Status: DC

## 2017-04-02 MED ORDER — ONDANSETRON HCL 4 MG PO TABS: 4.0000 mg | ORAL_TABLET | Freq: Four times a day (QID) | ORAL | 0 refills | Status: DC

## 2017-04-02 MED ORDER — OXYCODONE-ACETAMINOPHEN 5-325 MG PO TABS: 1.0000 | ORAL_TABLET | Freq: Four times a day (QID) | ORAL | 0 refills | Status: DC | PRN

## 2017-04-02 MED ORDER — IOPAMIDOL (ISOVUE-300) INJECTION 61%
100.0000 mL | Freq: Once | INTRAVENOUS | Status: AC | PRN
  Administered 2017-04-02: 100 mL via INTRAVENOUS

## 2017-04-02 MED ORDER — SODIUM CHLORIDE 0.9 % IV BOLUS (SEPSIS)
1000.0000 mL | Freq: Once | INTRAVENOUS | Status: AC
  Administered 2017-04-02: 1000 mL via INTRAVENOUS

## 2017-04-02 MED ORDER — IOPAMIDOL (ISOVUE-300) INJECTION 61%
INTRAVENOUS | Status: AC
  Filled 2017-04-02: qty 100

## 2017-04-02 MED ORDER — FENTANYL CITRATE (PF) 100 MCG/2ML IJ SOLN
50.0000 ug | Freq: Once | INTRAMUSCULAR | Status: AC
  Administered 2017-04-02: 50 ug via INTRAVENOUS
  Filled 2017-04-02: qty 2

## 2017-04-02 MED ORDER — ONDANSETRON HCL 4 MG/2ML IJ SOLN
4.0000 mg | Freq: Once | INTRAMUSCULAR | Status: AC
  Administered 2017-04-02: 4 mg via INTRAVENOUS
  Filled 2017-04-02: qty 2

## 2017-04-02 MED ORDER — METRONIDAZOLE 500 MG PO TABS: 500.0000 mg | ORAL_TABLET | Freq: Three times a day (TID) | ORAL | 0 refills | Status: DC

## 2017-04-02 MED ORDER — FENTANYL CITRATE (PF) 100 MCG/2ML IJ SOLN
25.0000 ug | Freq: Once | INTRAMUSCULAR | Status: AC
  Administered 2017-04-02: 25 ug via INTRAVENOUS
  Filled 2017-04-02: qty 2

## 2017-04-02 MED ORDER — PANTOPRAZOLE SODIUM 40 MG IV SOLR
40.0000 mg | Freq: Once | INTRAVENOUS | Status: AC
  Administered 2017-04-02: 40 mg via INTRAVENOUS
  Filled 2017-04-02: qty 40

## 2017-04-02 NOTE — ED Triage Notes (Signed)
Pt c/o nausea, emesis, diarrhea, progressive intermittent sharp lower and upper abdominal pain. No blood in emesis or stool. No sick contacts or bad food.

## 2017-04-02 NOTE — ED Provider Notes (Signed)
[facility]-EMERGENCY DEPT Provider Note   CSN: [id number] Arrival date & time: 04/02/17  9562     History   Chief Complaint Chief Complaint  Patient presents with  . Abdominal Pain    HPI Shirley [name] is a 41 y.o. female.  Patient complains of abdominal pain and diarrhea for a few days. Some nausea no fever   The history is provided by the patient. No language interpreter was used.  Abdominal Pain   This is a new problem. The current episode started more than 2 days ago. The problem occurs constantly. The problem has not changed since onset.The pain is associated with an unknown factor. The pain is located in the RLQ. The quality of the pain is aching. The pain is at a severity of 5/10. The pain is moderate. Associated symptoms include anorexia and diarrhea. Pertinent negatives include frequency, hematuria and headaches.    Past Medical History:  Diagnosis Date  . Anemia   . Blood dyscrasia    has to apply a lot of pressure to stop bleeding  . GERD (gastroesophageal reflux disease)   . Heart murmur    with pregnancy  . History of hiatal hernia   . Hypertension    4-5 years ago, no meds  . Kidney infection    patient states I might have a kidney infection  . Migraine    migraines    Patient Active Problem List   Diagnosis Date Noted  . Acute postoperative pain 07/31/2016  . Menometrorrhagia 04/09/2016  . Absolute anemia 04/09/2016  . Abnormal uterine bleeding (AUB) 04/09/2016  . Essential hypertension 11/17/2009    Past Surgical History:  Procedure Laterality Date  . ABDOMINAL HERNIA REPAIR    . CESAREAN SECTION     x 4  . CESAREAN SECTION     x 4  . ENDOMETRIAL ABLATION  07/31/2016  . HYSTEROSCOPY  07/31/2016   Procedure: HYSTEROSCOPY WITH HYDROTHERMAL ABLATION;  Surgeon: Whitley Gardens Bing, MD;  Location: [facility];  Service: Gynecology;;    OB History    Gravida Para Term Preterm AB Living   5 4 3 1 1 4    SAB TAB Ectopic Multiple Live Births                 Obstetric Comments   c--section x 4. 36wk c-section x 1       Home Medications    Prior to Admission medications   Medication Sig Start Date End Date Taking? Authorizing Provider  acetaminophen (TYLENOL) 500 MG tablet Take 1,000 mg by mouth every 6 (six) hours as needed for moderate pain or headache.   Yes [provider]  albuterol (PROVENTIL HFA;VENTOLIN HFA) 108 (90 Base) MCG/ACT inhaler Inhale 2 puffs into the lungs every 6 (six) hours as needed for wheezing or shortness of breath.   Yes [provider]  diphenhydrAMINE (BENADRYL) 25 MG tablet Take 25 mg by mouth every 6 (six) hours as needed for allergies.   Yes [provider]  Iron-FA-B Cmp-C-Biot-Probiotic (FUSION PLUS) CAPS Take 1 capsule by mouth daily.   Yes [provider]  omeprazole (PRILOSEC) 20 MG capsule Take 1 capsule (20 mg total) by mouth daily. Patient taking differently: Take 20 mg by mouth 2 (two) times daily before a meal.  10/17/15  Yes Barrett, Rolm Gala, PA-C  polyethylene glycol powder (MIRALAX) powder 1 capful daily until normal bowel movements resume 08/06/16  Yes Marny Lowenstein, PA-C  benzonatate (TESSALON) 100 MG capsule Take 1 capsule (100  mg total) by mouth every 8 (eight) hours. Patient not taking: Reported on 04/02/2017 10/29/16   Jaynie CrumbleKirichenko, Tatyana, PA-C  ciprofloxacin (CIPRO) 500 MG tablet Take 1 tablet (500 mg total) by mouth 2 (two) times daily. 04/02/17   Bethann BerkshireZammit, Delonda Coley, MD  HYDROmorphone (DILAUDID) 2 MG tablet Take 1 tablet (2 mg total) by mouth every 4 (four) hours as needed for severe pain. Patient not taking: Reported on 08/30/2016 08/01/16   Anyanwu, Jethro BastosUgonna A, MD  lisinopril (PRINIVIL,ZESTRIL) 20 MG tablet TAKE 1 TABLET (20 MG TOTAL) BY MOUTH DAILY. Patient not taking: Reported on 04/02/2017 01/14/17   Anyanwu, Jethro BastosUgonna A, MD  metroNIDAZOLE (FLAGYL) 500 MG tablet Take 1 tablet (500 mg total) by mouth 3 (three) times daily. One po bid x 7 days 04/02/17   Bethann BerkshireZammit, Mike Hamre, MD   ondansetron (ZOFRAN) 4 MG tablet Take 1 tablet (4 mg total) by mouth every 6 (six) hours. 04/02/17   Bethann BerkshireZammit, Cailean Heacock, MD  ondansetron (ZOFRAN-ODT) 8 MG disintegrating tablet Take 8 mg by mouth every 8 (eight) hours as needed for nausea or vomiting.    [provider]  oxyCODONE-acetaminophen (PERCOCET) 5-325 MG tablet Take 1 tablet by mouth every 6 (six) hours as needed. 04/02/17   Bethann BerkshireZammit, Boyce Keltner, MD  predniSONE (DELTASONE) 20 MG tablet Take 2 tablets (40 mg total) by mouth daily. Patient not taking: Reported on 04/02/2017 10/29/16   Jaynie CrumbleKirichenko, Tatyana, PA-C  traMADol (ULTRAM) 50 MG tablet Take 1 tablet (50 mg total) by mouth every 6 (six) hours as needed for severe pain. Patient not taking: Reported on 08/30/2016 03/05/16   Tereso NewcomerAnyanwu, Ugonna A, MD    Family History Family History  Problem Relation Age of Onset  . Hypertension Mother   . Colon cancer Father [id number]    Social History Social History  Substance Use Topics  . Smoking status: Current Every Day Smoker    Packs/day: 0.50    Types: Cigarettes  . Smokeless tobacco: Never Used  . Alcohol use No     Comment: occasionally     Allergies   Aspirin and Ibuprofen   Review of Systems Review of Systems  Constitutional: Negative for appetite change and fatigue.  HENT: Negative for congestion, ear discharge and sinus pressure.   Eyes: Negative for discharge.  Respiratory: Negative for cough.   Cardiovascular: Negative for chest pain.  Gastrointestinal: Positive for abdominal pain, anorexia and diarrhea.  Genitourinary: Negative for frequency and hematuria.  Musculoskeletal: Negative for back pain.  Skin: Negative for rash.  Neurological: Negative for seizures and headaches.  Psychiatric/Behavioral: Negative for hallucinations.     Physical Exam Updated Vital Signs BP (!) 160/97 (BP Location: Left Arm)   Pulse 79   Temp 99.1 F (37.3 C) (Oral)   Resp 18   SpO2 100%   Physical Exam  Constitutional: She is oriented  to person, place, and time. She appears well-developed.  HENT:  Head: Normocephalic.  Eyes: Conjunctivae and EOM are normal. No scleral icterus.  Neck: Neck supple. No thyromegaly present.  Cardiovascular: Normal rate and regular rhythm.  Exam reveals no gallop and no friction rub.   No murmur heard. Pulmonary/Chest: No stridor. She has no wheezes. She has no rales. She exhibits no tenderness.  Abdominal: She exhibits no distension. There is tenderness. There is no rebound.  Musculoskeletal: Normal range of motion. She exhibits no edema.  Lymphadenopathy:    She has no cervical adenopathy.  Neurological: She is oriented to person, place, and time. She exhibits normal muscle tone.  Coordination normal.  Skin: No rash noted. No erythema.  Psychiatric: She has a normal mood and affect. Her behavior is normal.     ED Treatments / Results  Labs (all labs ordered are listed, but only abnormal results are displayed) Labs Reviewed  COMPREHENSIVE METABOLIC PANEL - Abnormal; Notable for the following:       Result Value   Potassium 3.3 (*)    Total Protein 8.3 (*)    All other components within normal limits  URINALYSIS, ROUTINE W REFLEX MICROSCOPIC - Abnormal; Notable for the following:    APPearance HAZY (*)    Specific Gravity, Urine 1.044 (*)    Hgb urine dipstick MODERATE (*)    Leukocytes, UA TRACE (*)    Bacteria, UA FEW (*)    Squamous Epithelial / LPF 0-5 (*)    All other components within normal limits  LIPASE, BLOOD  CBC  I-STAT BETA HCG BLOOD, ED ([facility], [facility], [facility] ONLY)    EKG  EKG Interpretation None       Radiology Ct Abdomen Pelvis W Contrast  Result Date: 04/02/2017 CLINICAL DATA:  Nausea, emesis, diarrhea EXAM: CT ABDOMEN AND PELVIS WITH CONTRAST TECHNIQUE: Multidetector CT imaging of the abdomen and pelvis was performed using the standard protocol following bolus administration of intravenous contrast. CONTRAST:  ISOVUE-300 IOPAMIDOL (ISOVUE-300) INJECTION 61%  COMPARISON:  12/09/2013 FINDINGS: Lower chest: No acute abnormality. Hepatobiliary: No focal liver abnormality is seen. No gallstones, gallbladder wall thickening, or biliary dilatation. Pancreas: Unremarkable. No pancreatic ductal dilatation or surrounding inflammatory changes. Spleen: Normal in size without focal abnormality. Adrenals/Urinary Tract: Adrenal glands are unremarkable. Kidneys are normal, without renal calculi, focal lesion, or hydronephrosis. Bladder is unremarkable. Stomach/Bowel: Stomach is within normal limits. Appendix appears normal. Mild relative bowel wall thickening involving the ascending colon and proximal transverse colon concerning for colitis secondary to an infectious or inflammatory etiology. No pneumatosis, pneumoperitoneum or portal venous gas. Vascular/Lymphatic: No significant vascular findings are present. No enlarged abdominal or pelvic lymph nodes. Reproductive: Uterus and bilateral adnexa are unremarkable. Other: Stable 12 mm hypodense nodule within the subcutaneous fat of the anterior mid abdomen unchanged compared with 12/09/2013 with a underlying tiny ventral abdominal hernia. Musculoskeletal: No acute or significant osseous findings. IMPRESSION: 1. Mild relative bowel wall thickening involving the ascending colon and proximal transverse colon concerning for colitis secondary to an infectious or inflammatory etiology. 2. Stable 12 mm hypodense nodule within the subcutaneous fat of the anterior mid abdomen unchanged compared with 12/09/2013 with a underlying tiny ventral abdominal hernia. Electronically Signed   By: Elige Ko   On: 04/02/2017 12:16    Procedures Procedures (including critical care time)  Medications Ordered in ED Medications  iopamidol (ISOVUE-300) 61 % injection (not administered)  fentaNYL (SUBLIMAZE) injection 25 mcg (not administered)  pantoprazole (PROTONIX) injection 40 mg (40 mg Intravenous Given 04/02/17 0929)  fentaNYL (SUBLIMAZE)  injection 50 mcg (50 mcg Intravenous Given 04/02/17 0929)  ondansetron (ZOFRAN) injection 4 mg (4 mg Intravenous Given 04/02/17 0929)  sodium chloride 0.9 % bolus 1,000 mL (0 mLs Intravenous Stopped 04/02/17 1213)  iopamidol (ISOVUE-300) 61 % injection 100 mL (100 mLs Intravenous Contrast Given 04/02/17 1154)     Initial Impression / Assessment and Plan / ED Course  I have reviewed the triage vital signs and the nursing notes.  Pertinent labs & imaging results that were available during my care of the patient were reviewed by me and considered in my medical decision making (see chart for  details).     Patient with colitis on CT scan. She will be treated with Cipro Flagyl and given Percocet for pain and Zofran for nausea and will follow-up with her PCP  Final Clinical Impressions(s) / ED Diagnoses   Final diagnoses:  None    New Prescriptions New Prescriptions   CIPROFLOXACIN (CIPRO) 500 MG TABLET    Take 1 tablet (500 mg total) by mouth 2 (two) times daily.   METRONIDAZOLE (FLAGYL) 500 MG TABLET    Take 1 tablet (500 mg total) by mouth 3 (three) times daily. One po bid x 7 days   ONDANSETRON (ZOFRAN) 4 MG TABLET    Take 1 tablet (4 mg total) by mouth every 6 (six) hours.   OXYCODONE-ACETAMINOPHEN (PERCOCET) 5-325 MG TABLET    Take 1 tablet by mouth every 6 (six) hours as needed.     Bethann Berkshire, MD 04/02/17 1420

## 2017-04-02 NOTE — ED Notes (Signed)
Patient has ambulated to restroom for urine sample.

## 2017-04-02 NOTE — Discharge Instructions (Signed)
Drink plenty of fluids. Follow-up with her doctor later this week for recheck

## 2017-04-02 NOTE — ED Notes (Signed)
Patient was wanting to eat and drink. Spoke with Dr. Estell HarpinZammit and he would like to wait till her test results are back. Informed patient of this information.

## 2017-04-07 IMAGING — US US ABDOMEN LIMITED
1 series · 14 of 25 positions shown · non-contrast
Comparison: CT abdomen [DATE]

CLINICAL DATA: Patient with abdominal pain and vomiting.

EXAM:
ULTRASOUND ABDOMEN LIMITED RIGHT UPPER QUADRANT

[Series 1: us abdomen limited · 0.15mm/px · 14 of 42 slices shown]
[im 1/42]
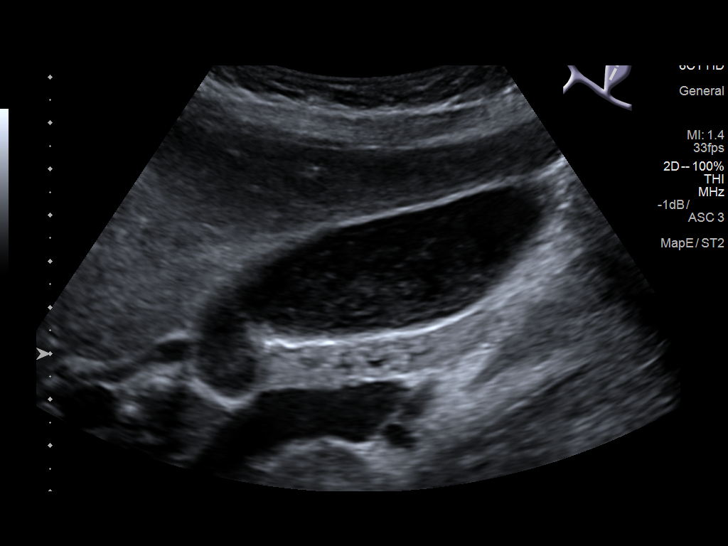
[im 4/42]
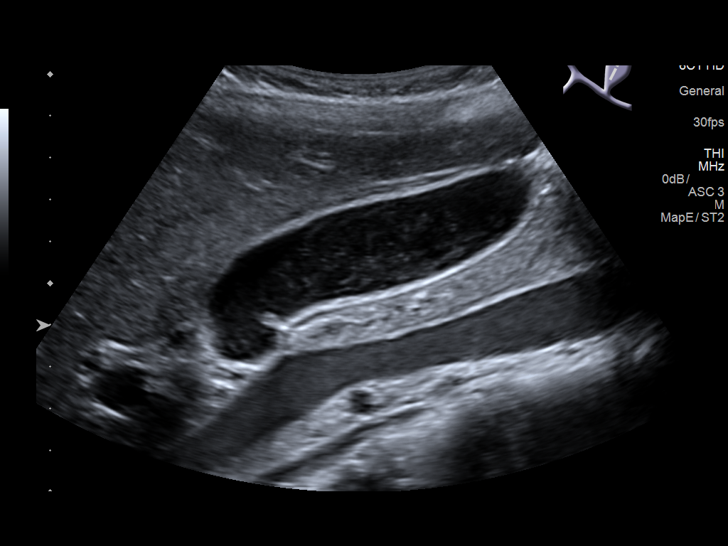
[im 7/42]
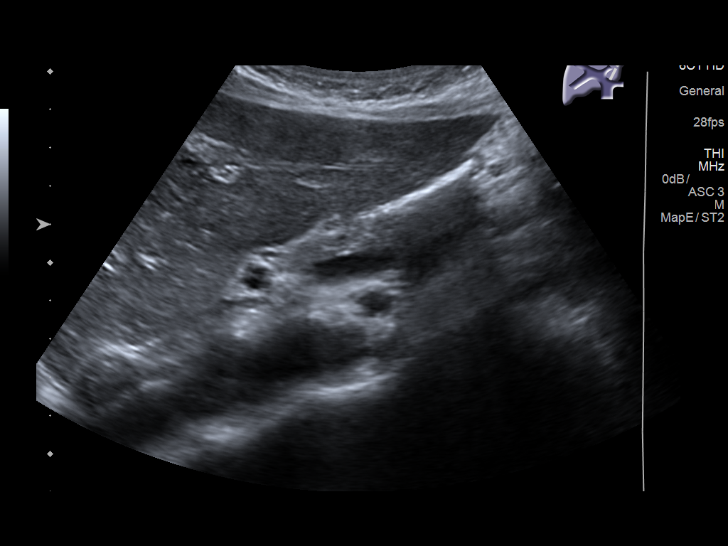
[im 11/42]
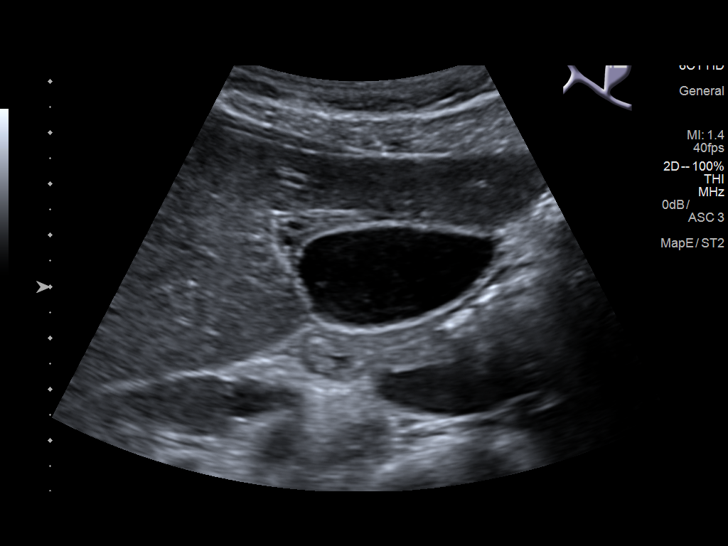
[im 14/42]
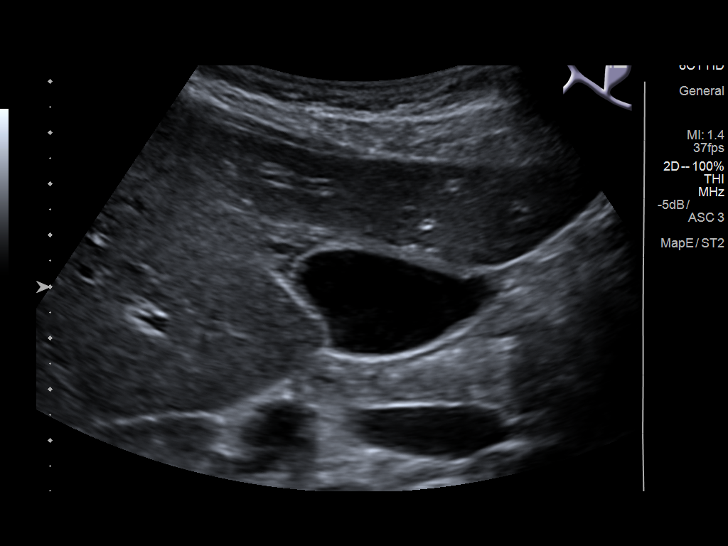
[im 16/42]
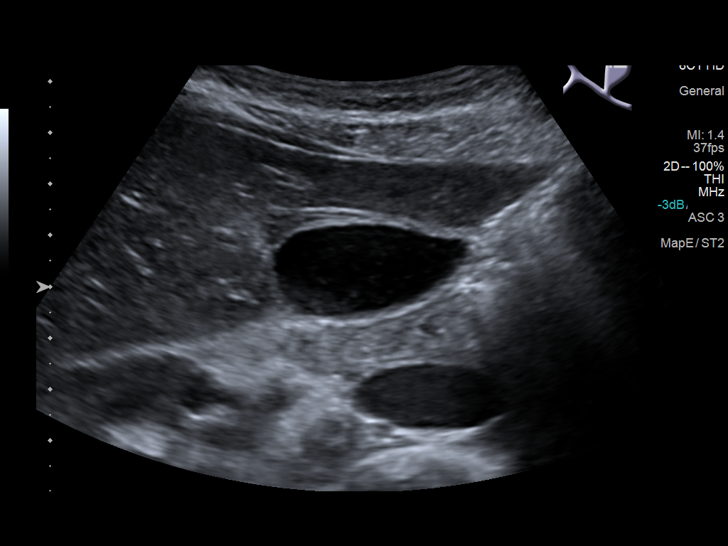
[im 19/42]
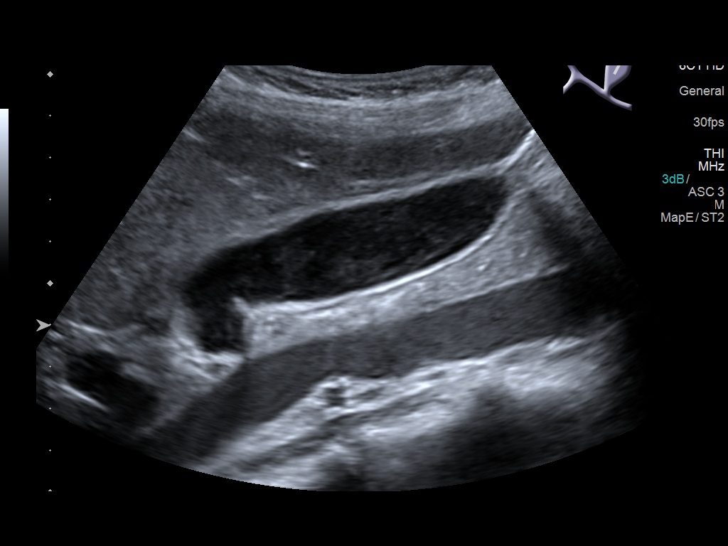
[im 23/42]
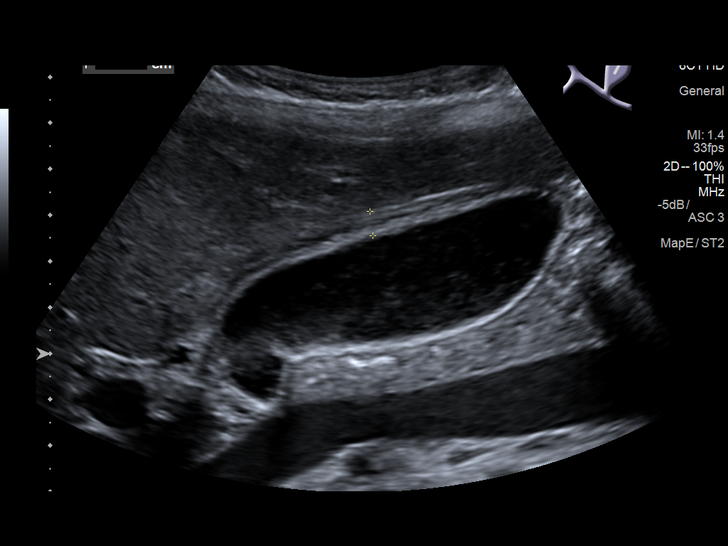
[im 26/42]
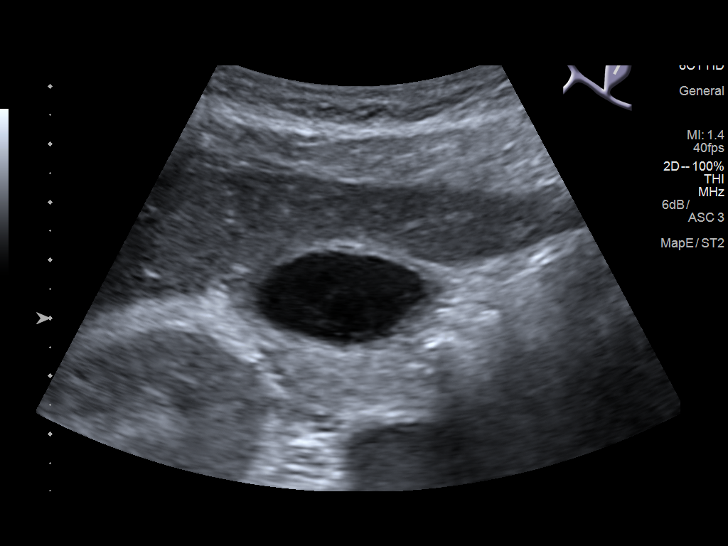
[im 28/42]
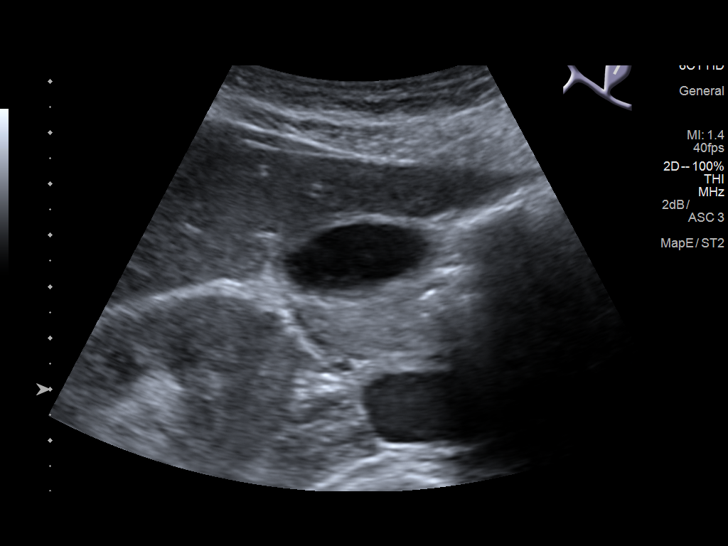
[im 31/42]
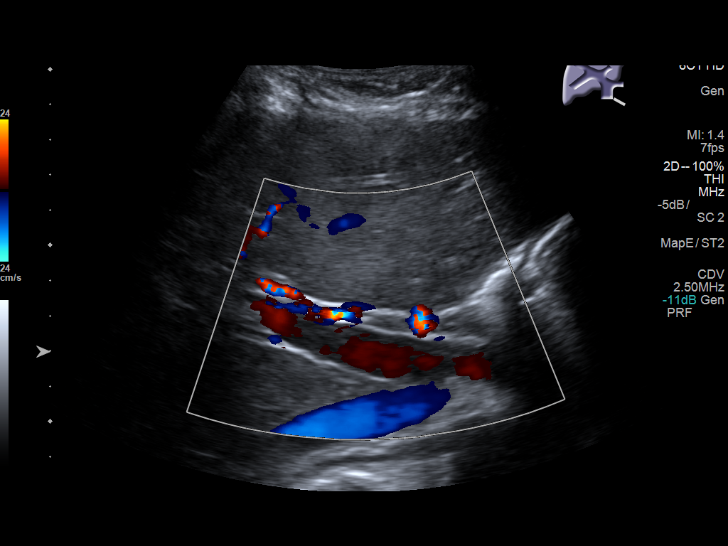
[im 35/42]
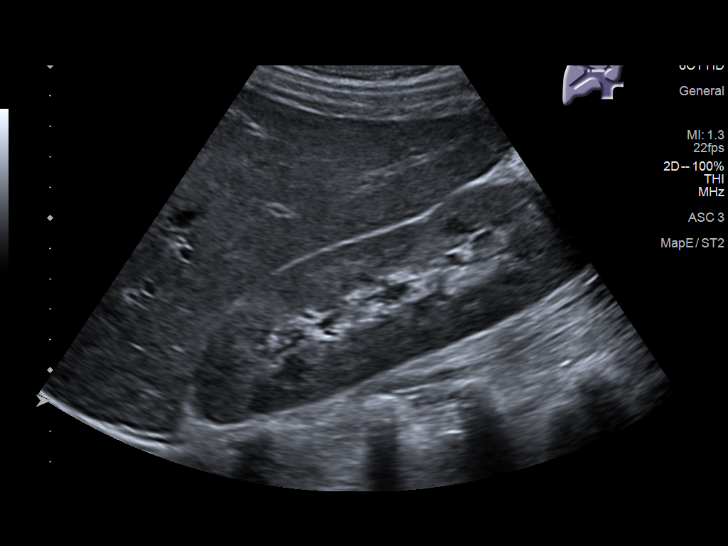
[im 38/42]
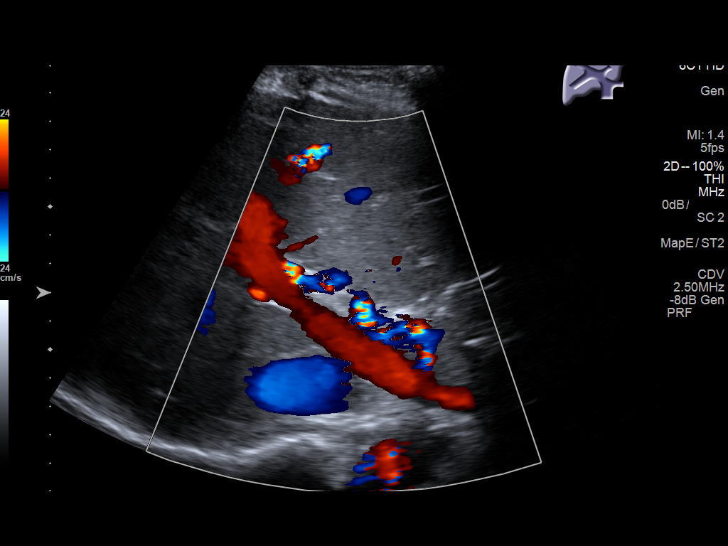
[im 42/42]
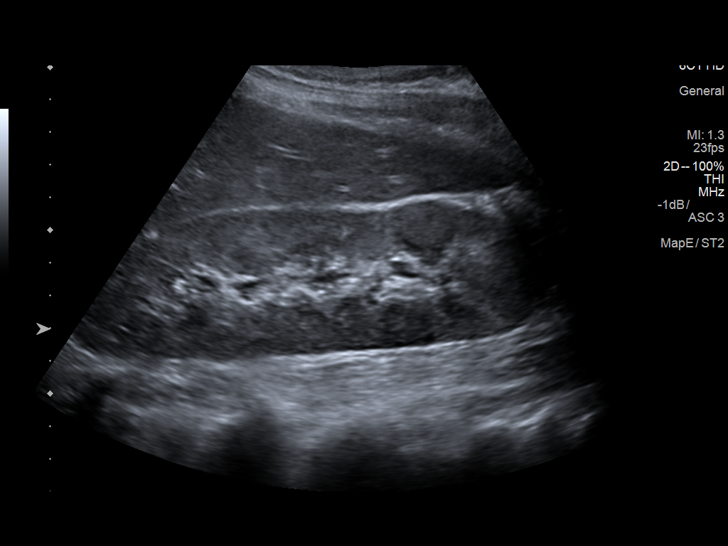

[14 of 25 positions shown; findings below may reference images not displayed]

FINDINGS: Gallbladder:

Sludge within the gallbladder lumen. Mild gallbladder wall
thickening. Negative sonographic AADEL'AADEL sign. No significant
pericholecystic fluid.

Common bile duct:

Diameter: 4 mm

Liver:

No focal lesion identified. Within normal limits in parenchymal
echogenicity. Portal vein is patent on color Doppler imaging with
normal direction of blood flow towards the liver.
IMPRESSION: Sludge within the gallbladder lumen. Gallbladder wall thickening.
Findings are indeterminate for acute cholecystitis. Consider further
evaluation with HIDA scan as clinically indicated.

## 2017-05-09 ENCOUNTER — Emergency Department (HOSPITAL_COMMUNITY): Payer: BLUE CROSS/BLUE SHIELD

## 2017-05-09 ENCOUNTER — Encounter (HOSPITAL_COMMUNITY): Payer: Self-pay

## 2017-05-09 ENCOUNTER — Emergency Department (HOSPITAL_COMMUNITY)
Admission: EM | Admit: 2017-05-09 | Discharge: 2017-05-10 | Disposition: A | Discharge status: Home / Self Care | Payer: BLUE CROSS/BLUE SHIELD | Attending: Emergency Medicine | Admitting: Emergency Medicine

## 2017-05-09 DIAGNOSIS — I10 Essential (primary) hypertension: Secondary | ICD-10-CM | POA: Diagnosis not present

## 2017-05-09 DIAGNOSIS — D649 Anemia, unspecified: Secondary | ICD-10-CM | POA: Diagnosis not present

## 2017-05-09 DIAGNOSIS — E876 Hypokalemia: Secondary | ICD-10-CM | POA: Insufficient documentation

## 2017-05-09 DIAGNOSIS — F17210 Nicotine dependence, cigarettes, uncomplicated: Secondary | ICD-10-CM | POA: Diagnosis not present

## 2017-05-09 DIAGNOSIS — Z7983 Long term (current) use of bisphosphonates: Secondary | ICD-10-CM | POA: Diagnosis not present

## 2017-05-09 DIAGNOSIS — Z79899 Other long term (current) drug therapy: Secondary | ICD-10-CM | POA: Insufficient documentation

## 2017-05-09 DIAGNOSIS — R569 Unspecified convulsions: Secondary | ICD-10-CM | POA: Diagnosis not present

## 2017-05-09 HISTORY — DX: Unspecified convulsions: R56.9

## 2017-05-09 LAB — URINALYSIS, ROUTINE W REFLEX MICROSCOPIC
Bacteria, UA: NONE SEEN
Bilirubin Urine: NEGATIVE
Glucose, UA: NEGATIVE mg/dL
Ketones, ur: NEGATIVE mg/dL
Nitrite: NEGATIVE
Protein, ur: 30 mg/dL — AB
Specific Gravity, Urine: 1.009 (ref 1.005–1.030)
pH: 6 (ref 5.0–8.0)

## 2017-05-09 LAB — CBC WITH DIFFERENTIAL/PLATELET
Basophils Absolute: 0 10*3/uL (ref 0.0–0.1)
Basophils Relative: 0 %
Eosinophils Absolute: 0.2 10*3/uL (ref 0.0–0.7)
Eosinophils Relative: 3 %
HCT: 37.7 % (ref 36.0–46.0)
Hemoglobin: 12.8 g/dL (ref 12.0–15.0)
Lymphocytes Relative: 44 %
Lymphs Abs: 2.4 10*3/uL (ref 0.7–4.0)
MCH: 32.1 pg (ref 26.0–34.0)
MCHC: 34 g/dL (ref 30.0–36.0)
MCV: 94.5 fL (ref 78.0–100.0)
Monocytes Absolute: 0.3 10*3/uL (ref 0.1–1.0)
Monocytes Relative: 6 %
Neutro Abs: 2.6 10*3/uL (ref 1.7–7.7)
Neutrophils Relative %: 47 %
Platelets: 267 10*3/uL (ref 150–400)
RBC: 3.99 MIL/uL (ref 3.87–5.11)
RDW: 13.1 % (ref 11.5–15.5)
WBC: 5.5 10*3/uL (ref 4.0–10.5)

## 2017-05-09 LAB — COMPREHENSIVE METABOLIC PANEL WITH GFR
ALT: 20 U/L (ref 14–54)
AST: 37 U/L (ref 15–41)
Albumin: 4.4 g/dL (ref 3.5–5.0)
Alkaline Phosphatase: 45 U/L (ref 38–126)
Anion gap: 13 (ref 5–15)
BUN: 5 mg/dL — ABNORMAL LOW (ref 6–20)
CO2: 22 mmol/L (ref 22–32)
Calcium: 9.1 mg/dL (ref 8.9–10.3)
Chloride: 101 mmol/L (ref 101–111)
Creatinine, Ser: 0.85 mg/dL (ref 0.44–1.00)
GFR calc Af Amer: >60 mL/min
GFR calc non Af Amer: >60 mL/min
Glucose, Bld: 82 mg/dL (ref 65–99)
Potassium: 2.6 mmol/L — CL (ref 3.5–5.1)
Sodium: 136 mmol/L (ref 135–145)
Total Bilirubin: 0.5 mg/dL (ref 0.3–1.2)
Total Protein: 7.4 g/dL (ref 6.5–8.1)

## 2017-05-09 LAB — RAPID URINE DRUG SCREEN, HOSP PERFORMED
Amphetamines: NOT DETECTED
Barbiturates: NOT DETECTED
Benzodiazepines: NOT DETECTED
Cocaine: NOT DETECTED
Opiates: NOT DETECTED
Tetrahydrocannabinol: POSITIVE — AB

## 2017-05-09 LAB — MAGNESIUM: Magnesium: 1.8 mg/dL (ref 1.7–2.4)

## 2017-05-09 LAB — CBG MONITORING, ED: Glucose-Capillary: 84 mg/dL (ref 65–99)

## 2017-05-09 LAB — POTASSIUM: Potassium: 3.3 mmol/L — ABNORMAL LOW (ref 3.5–5.1)

## 2017-05-09 LAB — I-STAT BETA HCG BLOOD, ED (MC, WL, AP ONLY): I-stat hCG, quantitative: <5 m[IU]/mL (ref <5)

## 2017-05-09 LAB — ETHANOL: Alcohol, Ethyl (B): <5 mg/dL

## 2017-05-09 IMAGING — DX DG CHEST 1V PORT
1 series · 1 of 1 positions shown · non-contrast
Comparison: [DATE]

CLINICAL DATA: No chest pain or shortness of breath.  Seizures.

EXAM:
PORTABLE CHEST 1 VIEW

[chest ap]
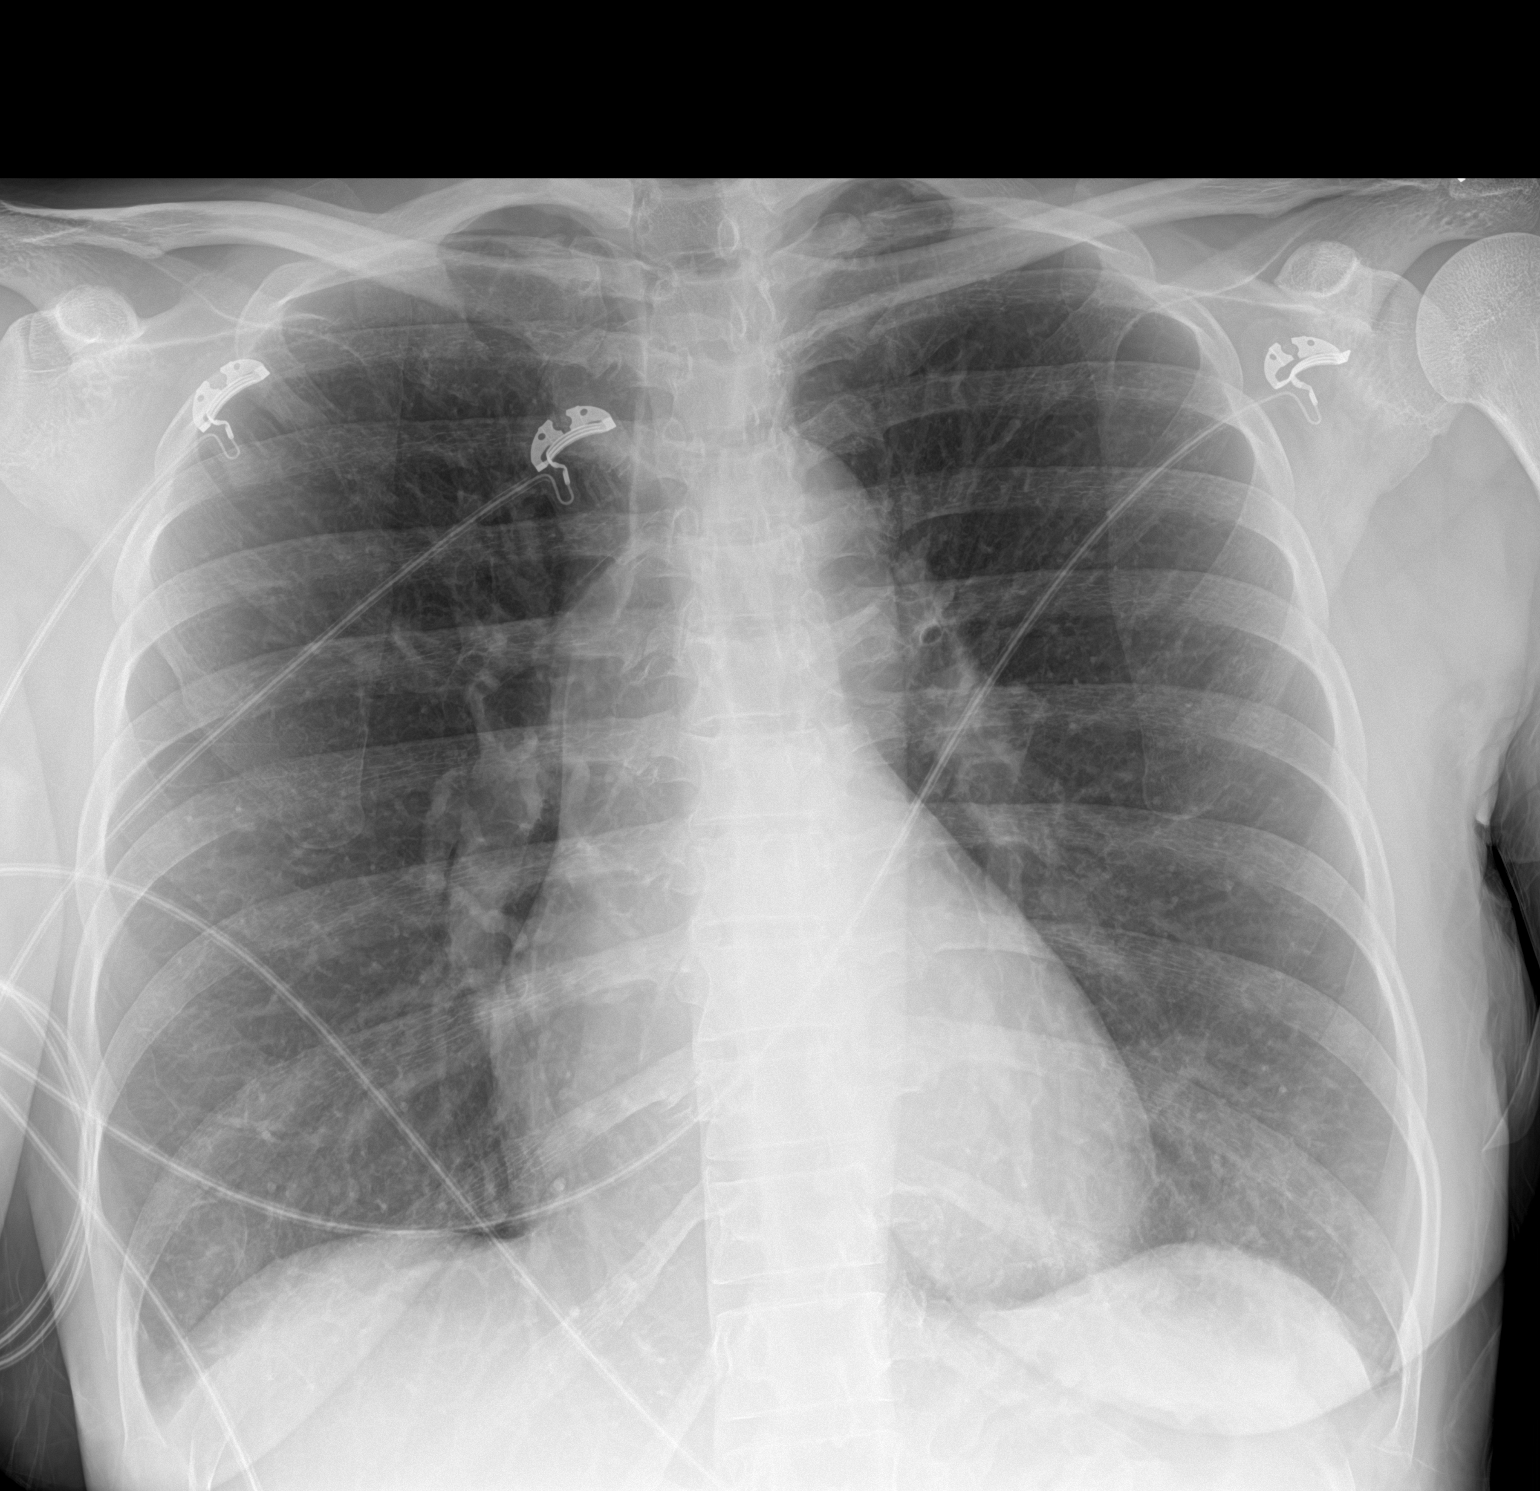

[1 of 1 positions shown; findings below may reference images not displayed]

FINDINGS: The heart size and mediastinal contours are within normal limits.
Both lungs are clear. The visualized skeletal structures are
unremarkable.
IMPRESSION: No active disease.

## 2017-05-09 IMAGING — CT CT HEAD W/O CM
4 series · 15 of 47 positions shown, 17 images · non-contrast
Comparison: [DATE]

CLINICAL DATA: Altered level of consciousness, new onset of
seizures.

EXAM:
CT HEAD WITHOUT CONTRAST
TECHNIQUE: Contiguous axial images were obtained from the base of the skull
through the vertex without intravenous contrast.

[Series 3: head wo · axial · 0.46mm/px · z∈[-204,-84]mm · 7 of 32 slices shown, 9 images]
[im 4/32  brain]
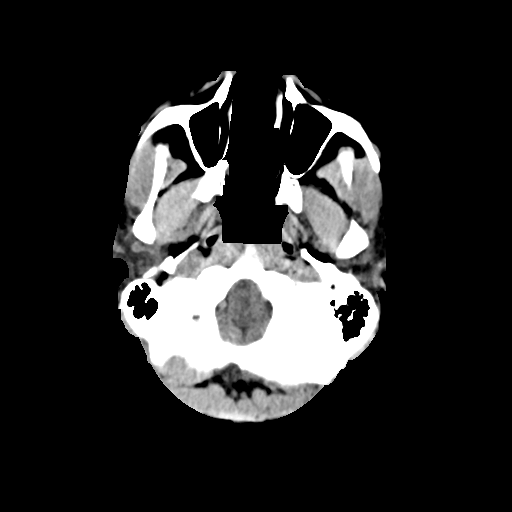
[im 4/32  bone]
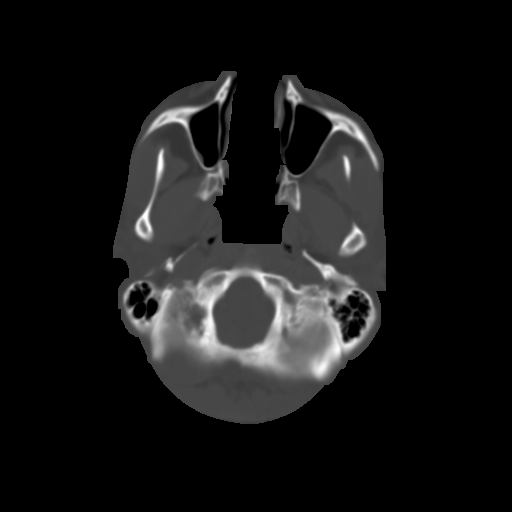
[im 8/32  brain]
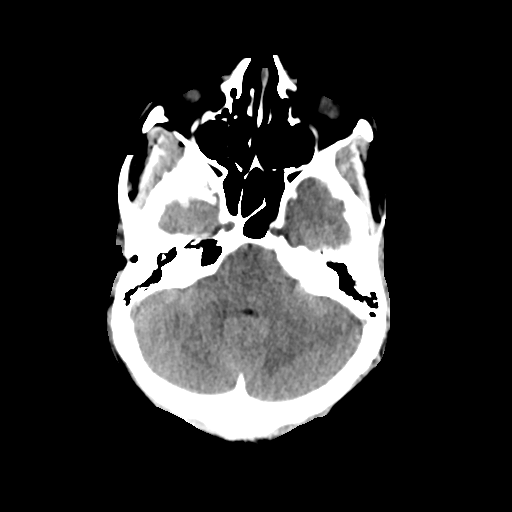
[im 12/32  brain]
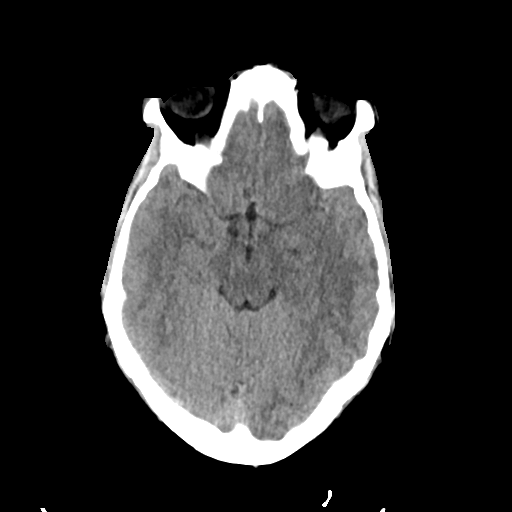
[im 16/32  brain]
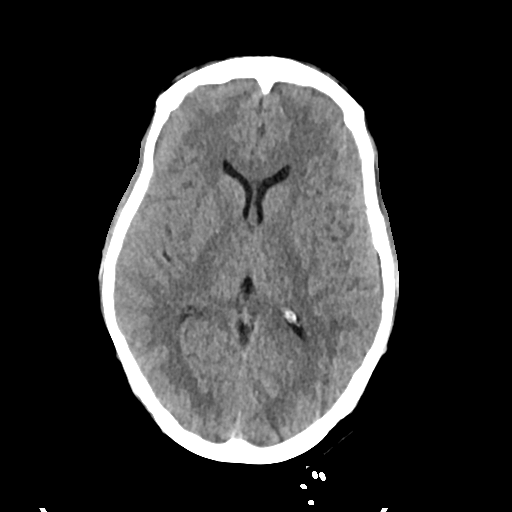
[im 20/32  brain]
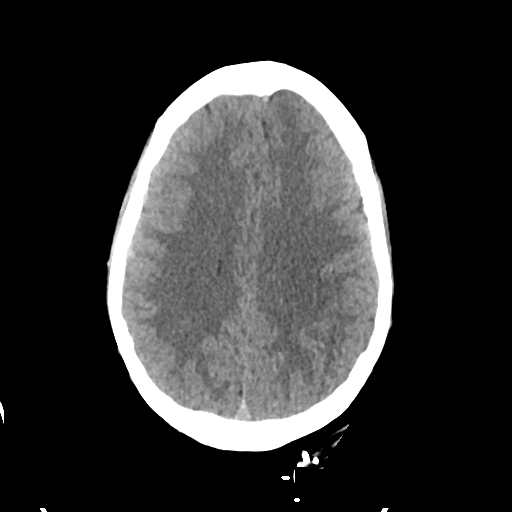
[im 20/32  bone]
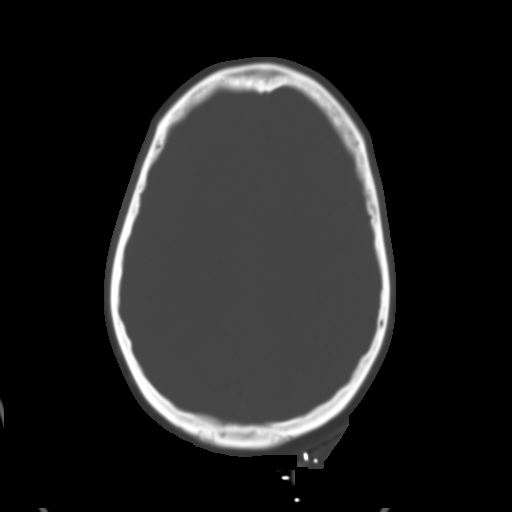
[im 24/32  brain]
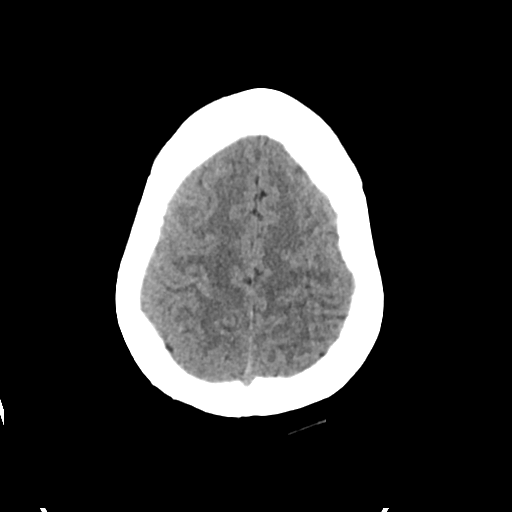
[im 28/32  brain]
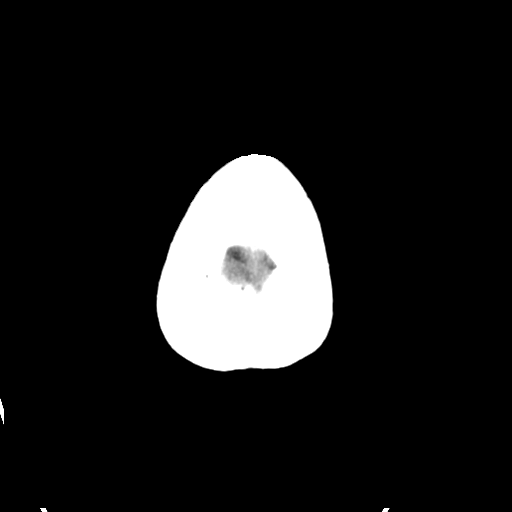

[Series 4: head bone · axial · 0.46mm/px · z∈[-204,-188]mm · 2 of 79 slices shown]
[im 8/79  bone]
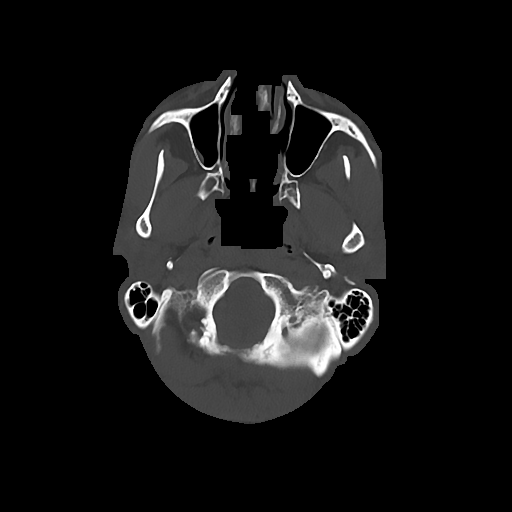
[im 16/79  bone]
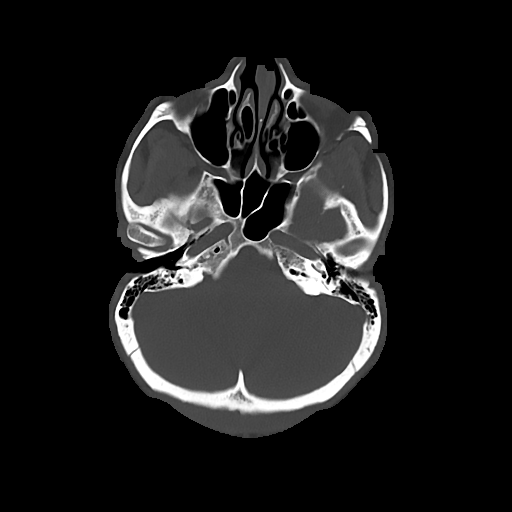

[Series 5: cor soft · coronal · 0.31mm/px · 3 of 67 slices shown]
[im 23/67  brain]
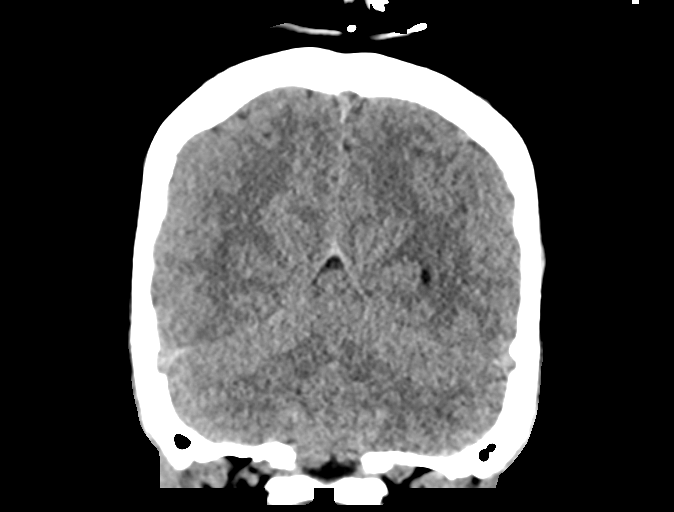
[im 30/67  brain]
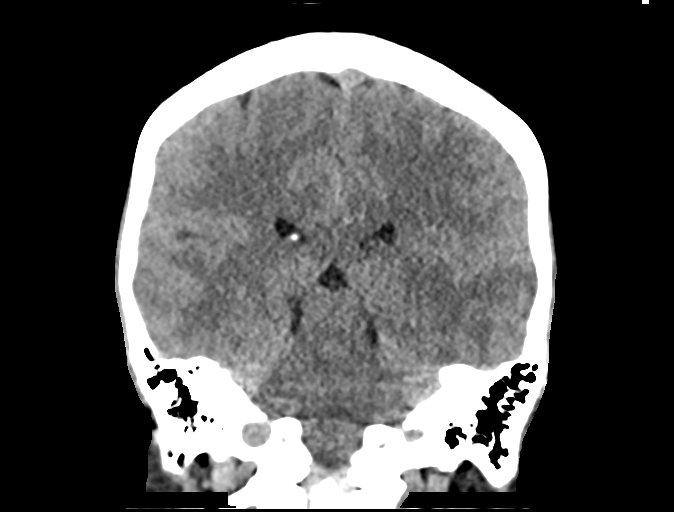
[im 37/67  brain]
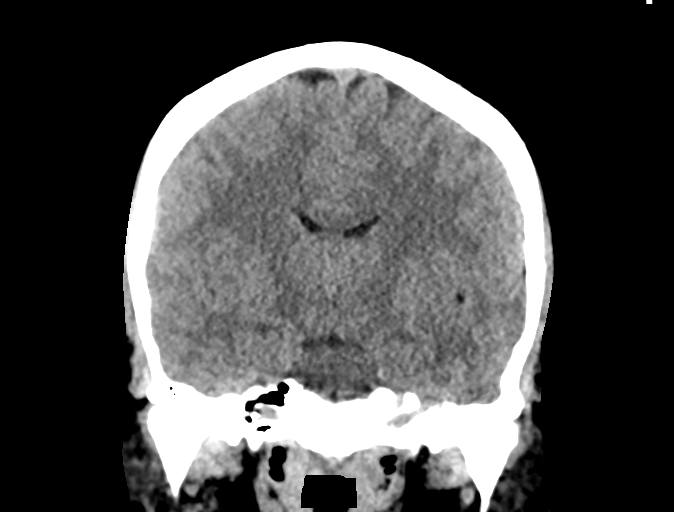

[Series 6: sag soft · sagittal · 0.31mm/px · 3 of 55 slices shown]
[im 19/55  brain]
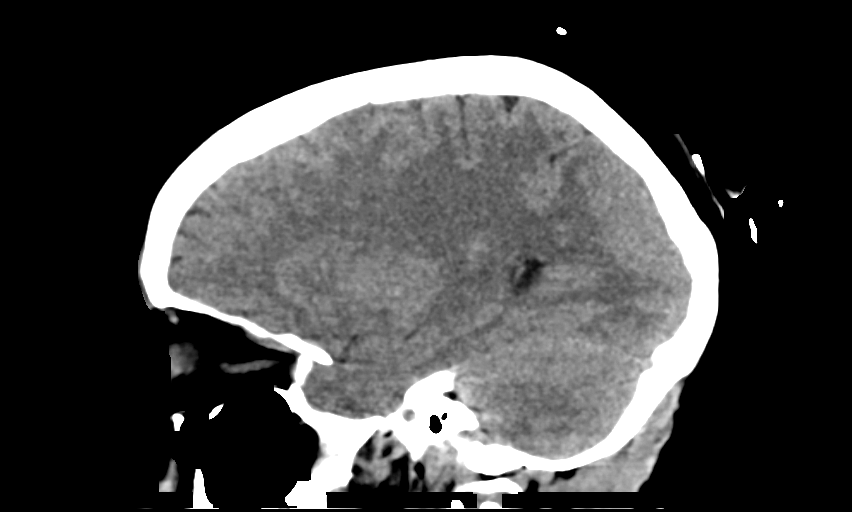
[im 28/55  brain]
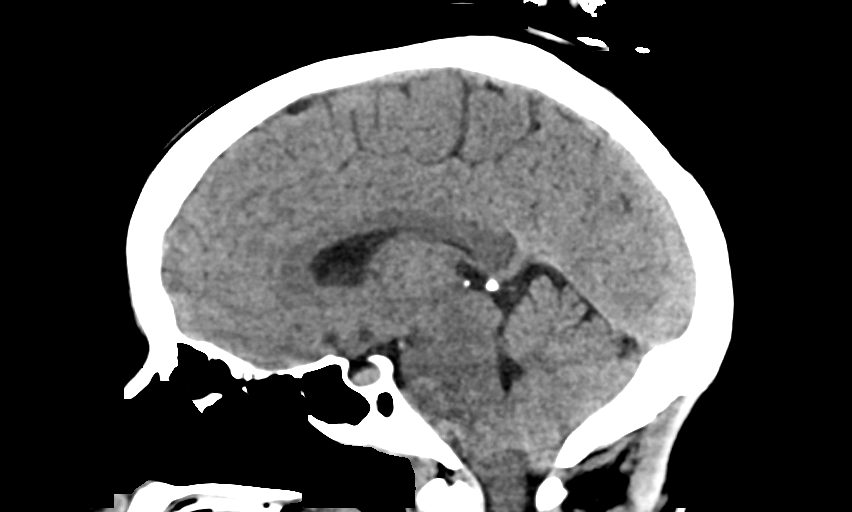
[im 37/55  brain]
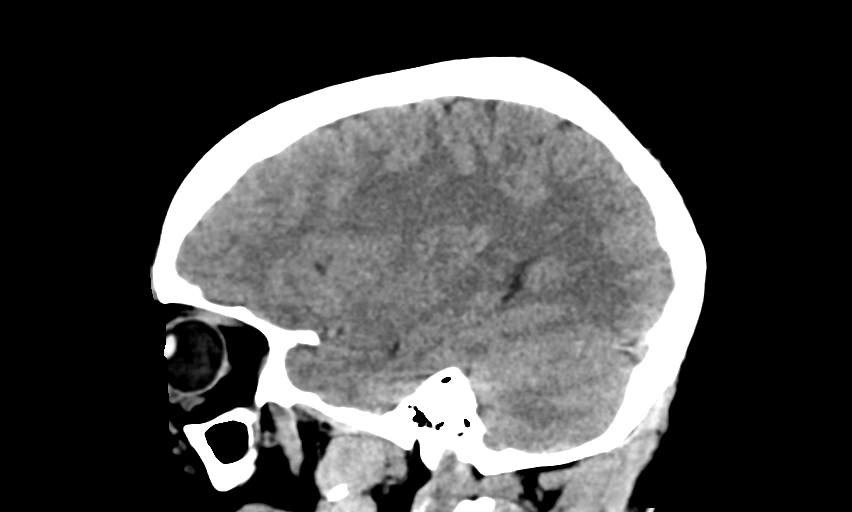

[15 of 47 positions shown; findings below may reference images not displayed]

FINDINGS: BRAIN: The ventricles and sulci are normal. No intraparenchymal
hemorrhage, mass effect nor midline shift. No acute large vascular
territory infarcts. Grey-white matter distinction is maintained. The
basal ganglia are unremarkable. No abnormal extra-axial fluid
collections. Basal cisterns are not effaced and midline. The
brainstem and cerebellar hemispheres are without acute
abnormalities.

VASCULAR: Unremarkable.

SKULL/SOFT TISSUES: No skull fracture. No significant soft tissue
swelling.

ORBITS/SINUSES: The included ocular globes and orbital contents are
normal.The mastoid air cells are clear. The included paranasal
sinuses are well-aerated.

OTHER: None.
IMPRESSION: No acute intracranial abnormality.

## 2017-05-09 MED ORDER — SODIUM CHLORIDE 0.9 % IV SOLN
INTRAVENOUS | Status: DC
  Administered 2017-05-09 (×2): via INTRAVENOUS

## 2017-05-09 MED ORDER — SODIUM CHLORIDE 0.9 % IV BOLUS (SEPSIS)
1000.0000 mL | Freq: Once | INTRAVENOUS | Status: AC
  Administered 2017-05-09: 1000 mL via INTRAVENOUS

## 2017-05-09 MED ORDER — ONDANSETRON HCL 4 MG/2ML IJ SOLN
4.0000 mg | Freq: Once | INTRAMUSCULAR | Status: AC
  Administered 2017-05-09: 4 mg via INTRAVENOUS
  Filled 2017-05-09: qty 2

## 2017-05-09 MED ORDER — ACETAMINOPHEN 500 MG PO TABS
1000.0000 mg | ORAL_TABLET | Freq: Once | ORAL | Status: AC
  Administered 2017-05-09: 1000 mg via ORAL
  Filled 2017-05-09: qty 2

## 2017-05-09 MED ORDER — POTASSIUM CHLORIDE CRYS ER 20 MEQ PO TBCR
40.0000 meq | EXTENDED_RELEASE_TABLET | Freq: Once | ORAL | Status: AC
  Administered 2017-05-09: 40 meq via ORAL
  Filled 2017-05-09: qty 2

## 2017-05-09 MED ORDER — LORAZEPAM 2 MG/ML IJ SOLN
0.5000 mg | Freq: Once | INTRAMUSCULAR | Status: AC
  Administered 2017-05-09: 0.5 mg via INTRAVENOUS
  Filled 2017-05-09: qty 1

## 2017-05-09 MED ORDER — POTASSIUM CHLORIDE 10 MEQ/100ML IV SOLN
10.0000 meq | INTRAVENOUS | Status: AC
  Administered 2017-05-09 (×3): 10 meq via INTRAVENOUS
  Filled 2017-05-09 (×3): qty 100

## 2017-05-09 NOTE — Discharge Instructions (Signed)
As discussed, with your new seizure activity is very important that you follow-up with our neurology colleagues. They have been provided information, and will contact you for a follow-up visit. Please do not drive, until you have been cleared to do so by a neurologist or your primary care physician.  Return here for concerning changes in your condition.

## 2017-05-09 NOTE — ED Triage Notes (Signed)
Pt arrives EMS after new onset seizure. Pt was at childs daycare when she started having seizure in lobby. Staff lowered pt to the ground and report full body convulsions lasting about 1 min. No meds pta. Pt post ictal on arrival. EMS reports pt was diaphoretic on their arrival.  154/100 Hr 88 rr 16  spo2 98% RA cbg 88

## 2017-05-09 NOTE — ED Provider Notes (Signed)
[facility]-EMERGENCY DEPT Provider Note   CSN: [id number] Arrival date & time: 05/09/17  1619     History   Chief Complaint Chief Complaint  Patient presents with  . Seizures    HPI Shirley [name] is a 41 y.o. female.  HPI Patient presents after a witnessed seizure. Patient has no history of seizure, states that she only has issues with blood pressure control, but takes medication regularly. She was with her daughter, and a physician's appointment, when she had an episode of seizure-like activity. Patient does not recall anything, and daughter states that the patient had classic appearing seizure activity, without trauma. Activity less than several moments, the patient was slow to respond. The patient herself states that she feels out of sorts, but denies focal pain, nausea. No recent medication changes, diet changes, alcohol intake changes, drug use, travel.  Past Medical History:  Diagnosis Date  . Anemia   . Blood dyscrasia    has to apply a lot of pressure to stop bleeding  . GERD (gastroesophageal reflux disease)   . Heart murmur    with pregnancy  . History of hiatal hernia   . Hypertension    4-5 years ago, no meds  . Kidney infection    patient states I might have a kidney infection  . Migraine    migraines    Patient Active Problem List   Diagnosis Date Noted  . Acute postoperative pain 07/31/2016  . Menometrorrhagia 04/09/2016  . Absolute anemia 04/09/2016  . Abnormal uterine bleeding (AUB) 04/09/2016  . Essential hypertension 11/17/2009    Past Surgical History:  Procedure Laterality Date  . ABDOMINAL HERNIA REPAIR    . CESAREAN SECTION     x 4  . CESAREAN SECTION     x 4  . ENDOMETRIAL ABLATION  07/31/2016  . HYSTEROSCOPY  07/31/2016   Procedure: HYSTEROSCOPY WITH HYDROTHERMAL ABLATION;  Surgeon: Derby Bing, MD;  Location: [facility];  Service: Gynecology;;    OB History    Gravida Para Term Preterm AB Living   5 4 3 1 1 4    SAB TAB Ectopic  Multiple Live Births                  Obstetric Comments   c--section x 4. 36wk c-section x 1       Home Medications    Prior to Admission medications   Medication Sig Start Date End Date Taking? Authorizing Provider  acetaminophen (TYLENOL) 500 MG tablet Take 1,000 mg by mouth every 6 (six) hours as needed for moderate pain or headache.    [provider]  albuterol (PROVENTIL HFA;VENTOLIN HFA) 108 (90 Base) MCG/ACT inhaler Inhale 2 puffs into the lungs every 6 (six) hours as needed for wheezing or shortness of breath.    [provider]  benzonatate (TESSALON) 100 MG capsule Take 1 capsule (100 mg total) by mouth every 8 (eight) hours. Patient not taking: Reported on 04/02/2017 10/29/16   Jaynie Crumble, PA-C  ciprofloxacin (CIPRO) 500 MG tablet Take 1 tablet (500 mg total) by mouth 2 (two) times daily. 04/02/17   Bethann Berkshire, MD  diphenhydrAMINE (BENADRYL) 25 MG tablet Take 25 mg by mouth every 6 (six) hours as needed for allergies.    [provider]  HYDROmorphone (DILAUDID) 2 MG tablet Take 1 tablet (2 mg total) by mouth every 4 (four) hours as needed for severe pain. Patient not taking: Reported on 08/30/2016 08/01/16   Tereso Newcomer, MD  Iron-FA-B  Cmp-C-Biot-Probiotic (FUSION PLUS) CAPS Take 1 capsule by mouth daily.    [provider]  lisinopril (PRINIVIL,ZESTRIL) 20 MG tablet TAKE 1 TABLET (20 MG TOTAL) BY MOUTH DAILY. Patient not taking: Reported on 04/02/2017 01/14/17   Anyanwu, Jethro Bastos, MD  metroNIDAZOLE (FLAGYL) 500 MG tablet Take 1 tablet (500 mg total) by mouth 3 (three) times daily. One po bid x 7 days 04/02/17   Bethann Berkshire, MD  omeprazole (PRILOSEC) 20 MG capsule Take 1 capsule (20 mg total) by mouth daily. Patient taking differently: Take 20 mg by mouth 2 (two) times daily before a meal.  10/17/15   Barrett, Stevi, PA-C  ondansetron (ZOFRAN) 4 MG tablet Take 1 tablet (4 mg total) by mouth every 6 (six) hours. 04/02/17   Bethann Berkshire, MD  ondansetron (ZOFRAN-ODT) 8 MG disintegrating tablet Take 8 mg by mouth every 8 (eight) hours as needed for nausea or vomiting.    [provider]  oxyCODONE-acetaminophen (PERCOCET) 5-325 MG tablet Take 1 tablet by mouth every 6 (six) hours as needed. 04/02/17   Bethann Berkshire, MD  polyethylene glycol powder [facility]) powder 1 capful daily until normal bowel movements resume 08/06/16   Marny Lowenstein, PA-C  predniSONE (DELTASONE) 20 MG tablet Take 2 tablets (40 mg total) by mouth daily. Patient not taking: Reported on 04/02/2017 10/29/16   Jaynie Crumble, PA-C  traMADol (ULTRAM) 50 MG tablet Take 1 tablet (50 mg total) by mouth every 6 (six) hours as needed for severe pain. Patient not taking: Reported on 08/30/2016 03/05/16   Tereso Newcomer, MD    Family History Family History  Problem Relation Age of Onset  . Hypertension Mother   . Colon cancer Father 37    Social History Social History  Substance Use Topics  . Smoking status: Current Every Day Smoker    Packs/day: 0.50    Types: Cigarettes  . Smokeless tobacco: Never Used  . Alcohol use No     Comment: occasionally     Allergies   Aspirin and Ibuprofen   Review of Systems Review of Systems  Constitutional:       Per HPI, otherwise negative  HENT:       Per HPI, otherwise negative  Respiratory:       Per HPI, otherwise negative  Cardiovascular:       Per HPI, otherwise negative  Gastrointestinal: Negative for vomiting.  Endocrine:       Negative aside from HPI  Genitourinary:       Neg aside from HPI   Musculoskeletal:       Per HPI, otherwise negative  Skin: Negative.   Neurological: Negative for seizures.       No Hx of seizures     Physical Exam Updated Vital Signs There were no vitals taken for this visit.  Physical Exam  Constitutional: She is oriented to person, place, and time. She appears well-developed and well-nourished. No distress.  HENT:  Head: Normocephalic and  atraumatic.  Eyes: Conjunctivae and EOM are normal.  Cardiovascular: Normal rate and regular rhythm.   Pulmonary/Chest: Effort normal and breath sounds normal. No stridor. No respiratory distress.  Abdominal: She exhibits no distension.  Musculoskeletal: She exhibits no edema.  Neurological: She is alert and oriented to person, place, and time. No cranial nerve deficit. She exhibits normal muscle tone. She displays no seizure activity.  Skin: Skin is warm and dry.  Psychiatric: She has a normal mood and affect.  Nursing note and vitals  reviewed.    ED Treatments / Results  Labs (all labs ordered are listed, but only abnormal results are displayed) Labs Reviewed  COMPREHENSIVE METABOLIC PANEL - Abnormal; Notable for the following:       Result Value   Potassium 2.6 (*)    BUN 5 (*)    All other components within normal limits  RAPID URINE DRUG SCREEN, HOSP PERFORMED - Abnormal; Notable for the following:    Tetrahydrocannabinol POSITIVE (*)    All other components within normal limits  URINALYSIS, ROUTINE W REFLEX MICROSCOPIC - Abnormal; Notable for the following:    Color, Urine STRAW (*)    Hgb urine dipstick MODERATE (*)    Protein, ur 30 (*)    Leukocytes, UA TRACE (*)    Squamous Epithelial / LPF 0-5 (*)    All other components within normal limits  CBC WITH DIFFERENTIAL/PLATELET  MAGNESIUM  ETHANOL  CBG MONITORING, ED  I-STAT BETA HCG BLOOD, ED ([facility], [facility], [facility] ONLY)     Radiology Ct Head Wo Contrast  Result Date: 05/09/2017 CLINICAL DATA:  Altered level of consciousness, new onset of seizures. EXAM: CT HEAD WITHOUT CONTRAST TECHNIQUE: Contiguous axial images were obtained from the base of the skull through the vertex without intravenous contrast. COMPARISON:  02/12/2007 FINDINGS: BRAIN: The ventricles and sulci are normal. No intraparenchymal hemorrhage, mass effect nor midline shift. No acute large vascular territory infarcts. Grey-white matter distinction is maintained.  The basal ganglia are unremarkable. No abnormal extra-axial fluid collections. Basal cisterns are not effaced and midline. The brainstem and cerebellar hemispheres are without acute abnormalities. VASCULAR: Unremarkable. SKULL/SOFT TISSUES: No skull fracture. No significant soft tissue swelling. ORBITS/SINUSES: The included ocular globes and orbital contents are normal.The mastoid air cells are clear. The included paranasal sinuses are well-aerated. OTHER: None. IMPRESSION: No acute intracranial abnormality. Electronically Signed   By: Tollie Ethavid  Kwon M.D.   On: 05/09/2017 17:44   Dg Chest Port 1 View  Result Date: 05/09/2017 CLINICAL DATA:  No chest pain or shortness of breath.  Seizures. EXAM: PORTABLE CHEST 1 VIEW COMPARISON:  10/29/2016 FINDINGS: The heart size and mediastinal contours are within normal limits. Both lungs are clear. The visualized skeletal structures are unremarkable. IMPRESSION: No active disease. Electronically Signed   By: Elige KoHetal  Patel   On: 05/09/2017 16:50    Procedures Procedures (including critical care time)  Medications Ordered in ED Medications  0.9 %  sodium chloride infusion (not administered)     Initial Impression / Assessment and Plan / ED Course  I have reviewed the triage vital signs and the nursing notes.  Pertinent labs & imaging results that were available during my care of the patient were reviewed by me and considered in my medical decision making (see chart for details).  6:51 PM Patient complains of headache, otherwise no additional seizure activity. She is now accompanied by multiple family members. We discussed initial results, notable for critically low potassium level. Patient states that she had a recent GI illness for which she completed a course of antibiotics about 2 weeks ago, otherwise no new medication changes. Patient will receive IV, and oral potassium repletion, IV fluids, analgesia.  11:49 PM Patient has completed repletion of her  potassium, has had no additional seizure activity. I reviewed all findings can with her, including positive marijuana results, we discussed possibilities for her new seizure episode, and importance of following up with neurology. I discussed driving restrictions.  With no ongoing seizure activity, patient will follow-up with  neurology as an outpatient.  Final Clinical Impressions(s) / ED Diagnoses  Seizure Hypokalemia   Gerhard Munch, MD 05/09/17 2349

## 2017-05-09 NOTE — ED Notes (Signed)
2nd dose of K+ still running; 3rd bag will be hung after; Pt still experiencing headache post seizure; Pt a&ox 4; family is not present on this RN round;lights remain dim for pt comfort

## 2017-05-09 NOTE — ED Notes (Signed)
Pt having twitching in LEFT arm while resting. Pt aware of twitching but not able to stop. Dr. Jeraldine LootsLockwood aware and order received for 0.5mg  Ativan.

## 2017-08-02 ENCOUNTER — Emergency Department (HOSPITAL_COMMUNITY)
Admission: EM | Admit: 2017-08-02 | Discharge: 2017-08-03 | Disposition: A | Discharge status: Home / Self Care | Payer: BLUE CROSS/BLUE SHIELD | Attending: Emergency Medicine | Admitting: Emergency Medicine

## 2017-08-02 ENCOUNTER — Emergency Department (HOSPITAL_COMMUNITY): Payer: BLUE CROSS/BLUE SHIELD

## 2017-08-02 ENCOUNTER — Encounter (HOSPITAL_COMMUNITY): Payer: Self-pay

## 2017-08-02 ENCOUNTER — Other Ambulatory Visit: Payer: Self-pay

## 2017-08-02 DIAGNOSIS — R42 Dizziness and giddiness: Secondary | ICD-10-CM | POA: Diagnosis not present

## 2017-08-02 DIAGNOSIS — F17210 Nicotine dependence, cigarettes, uncomplicated: Secondary | ICD-10-CM | POA: Diagnosis not present

## 2017-08-02 DIAGNOSIS — I10 Essential (primary) hypertension: Secondary | ICD-10-CM | POA: Insufficient documentation

## 2017-08-02 DIAGNOSIS — Z79899 Other long term (current) drug therapy: Secondary | ICD-10-CM | POA: Diagnosis not present

## 2017-08-02 DIAGNOSIS — R1031 Right lower quadrant pain: Secondary | ICD-10-CM | POA: Diagnosis not present

## 2017-08-02 DIAGNOSIS — N12 Tubulo-interstitial nephritis, not specified as acute or chronic: Secondary | ICD-10-CM

## 2017-08-02 DIAGNOSIS — M545 Low back pain: Secondary | ICD-10-CM | POA: Diagnosis present

## 2017-08-02 LAB — COMPREHENSIVE METABOLIC PANEL
ALT: 34 U/L (ref 14–54)
AST: 28 U/L (ref 15–41)
Albumin: 3.4 g/dL — ABNORMAL LOW (ref 3.5–5.0)
Alkaline Phosphatase: 115 U/L (ref 38–126)
Anion gap: 12 (ref 5–15)
BUN: 9 mg/dL (ref 6–20)
CO2: 18 mmol/L — ABNORMAL LOW (ref 22–32)
Calcium: 8.8 mg/dL — ABNORMAL LOW (ref 8.9–10.3)
Chloride: 104 mmol/L (ref 101–111)
Creatinine, Ser: 1.01 mg/dL — ABNORMAL HIGH (ref 0.44–1.00)
GFR calc Af Amer: >60 mL/min (ref >60)
GFR calc non Af Amer: >60 mL/min (ref >60)
Glucose, Bld: 104 mg/dL — ABNORMAL HIGH (ref 65–99)
Potassium: 3.4 mmol/L — ABNORMAL LOW (ref 3.5–5.1)
Sodium: 134 mmol/L — ABNORMAL LOW (ref 135–145)
Total Bilirubin: 1 mg/dL (ref 0.3–1.2)
Total Protein: 7.4 g/dL (ref 6.5–8.1)

## 2017-08-02 LAB — URINALYSIS, ROUTINE W REFLEX MICROSCOPIC
Bilirubin Urine: NEGATIVE
Glucose, UA: NEGATIVE mg/dL
Ketones, ur: 5 mg/dL — AB
Nitrite: NEGATIVE
Protein, ur: 100 mg/dL — AB
Specific Gravity, Urine: 1.016 (ref 1.005–1.030)
pH: 6 (ref 5.0–8.0)

## 2017-08-02 LAB — CBC WITH DIFFERENTIAL/PLATELET
Basophils Absolute: 0 10*3/uL (ref 0.0–0.1)
Basophils Relative: 0 %
Eosinophils Absolute: 0 10*3/uL (ref 0.0–0.7)
Eosinophils Relative: 0 %
HCT: 33.5 % — ABNORMAL LOW (ref 36.0–46.0)
Hemoglobin: 11.5 g/dL — ABNORMAL LOW (ref 12.0–15.0)
Lymphocytes Relative: 4 %
Lymphs Abs: 0.8 10*3/uL (ref 0.7–4.0)
MCH: 31.9 pg (ref 26.0–34.0)
MCHC: 34.3 g/dL (ref 30.0–36.0)
MCV: 93.1 fL (ref 78.0–100.0)
Monocytes Absolute: 0.9 10*3/uL (ref 0.1–1.0)
Monocytes Relative: 5 %
Neutro Abs: 17.9 10*3/uL — ABNORMAL HIGH (ref 1.7–7.7)
Neutrophils Relative %: 91 %
Platelets: 326 10*3/uL (ref 150–400)
RBC: 3.6 MIL/uL — ABNORMAL LOW (ref 3.87–5.11)
RDW: 14.2 % (ref 11.5–15.5)
WBC: 19.7 10*3/uL — ABNORMAL HIGH (ref 4.0–10.5)

## 2017-08-02 LAB — I-STAT CG4 LACTIC ACID, ED
Lactic Acid, Venous: 0.97 mmol/L (ref 0.5–1.9)
Lactic Acid, Venous: 1.7 mmol/L (ref 0.5–1.9)

## 2017-08-02 LAB — PROTIME-INR
INR: 1.16
Prothrombin Time: 14.7 seconds (ref 11.4–15.2)

## 2017-08-02 LAB — LIPASE, BLOOD: Lipase: 24 U/L (ref 11–51)

## 2017-08-02 IMAGING — CT CT HEAD W/O CM
3 series · 16 of 47 positions shown, 19 images · non-contrast
Comparison: Head CT scan [DATE] [DATE].

CLINICAL DATA: Lightheadedness and headache for 3 days.

EXAM:
CT HEAD WITHOUT CONTRAST
TECHNIQUE: Contiguous axial images were obtained from the base of the skull
through the vertex without intravenous contrast.

[Series 3: head 5.0 h30s · axial · 0.40mm/px · z∈[-150,-25]mm · 10 of 30 slices shown, 13 images]
[im 3/30  brain]
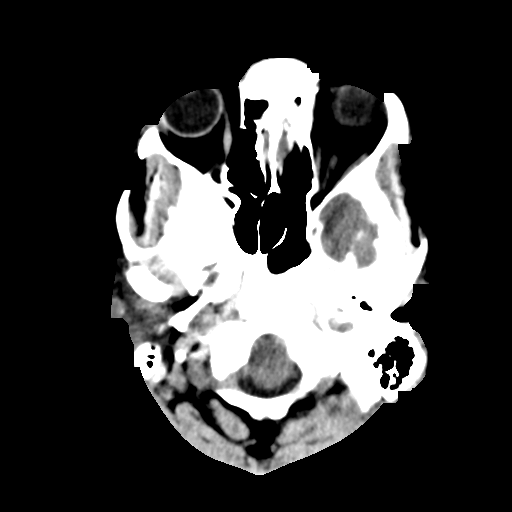
[im 3/30  bone]
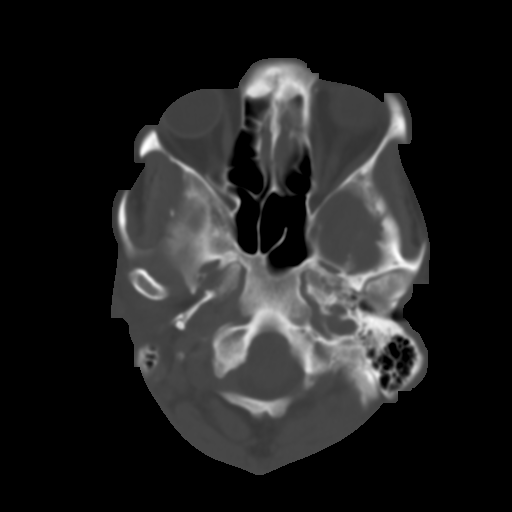
[im 6/30  brain]
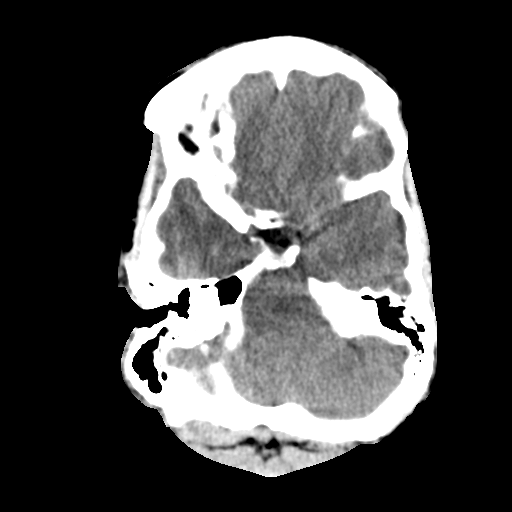
[im 9/30  brain]
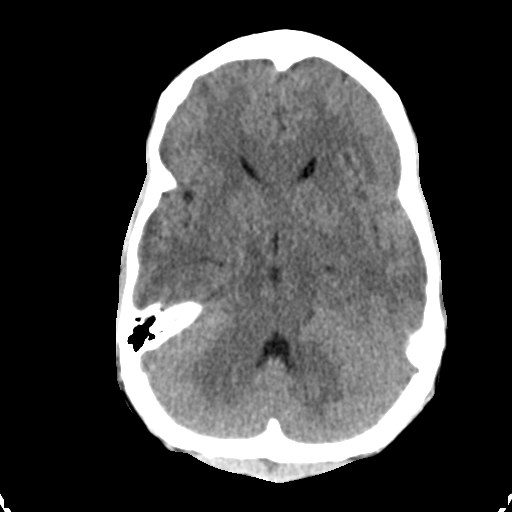
[im 11/30  brain]
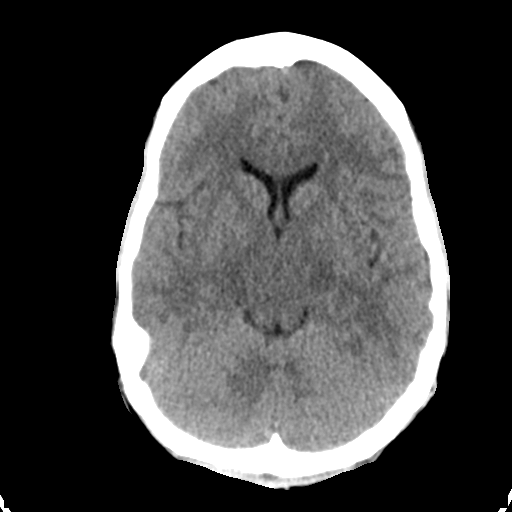
[im 14/30  brain]
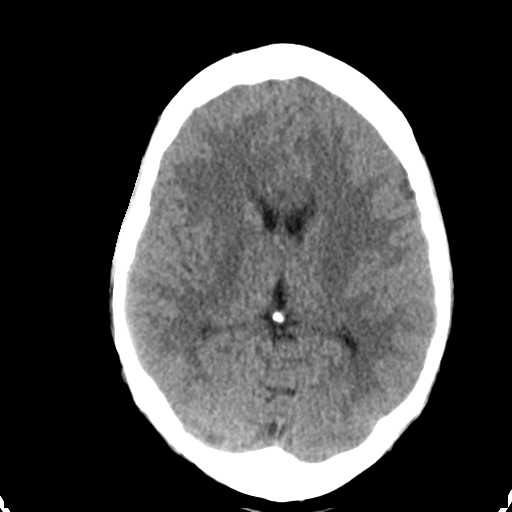
[im 14/30  bone]
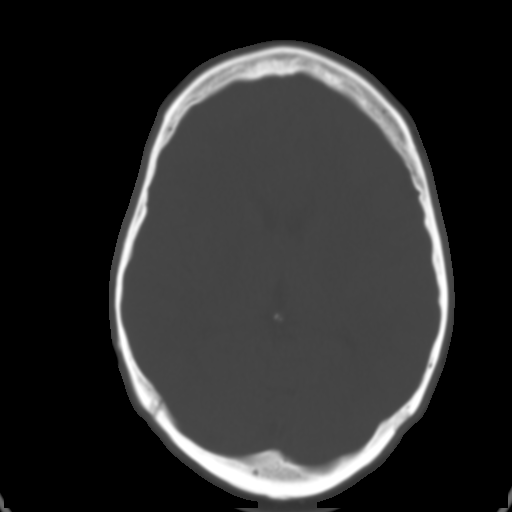
[im 17/30  brain]
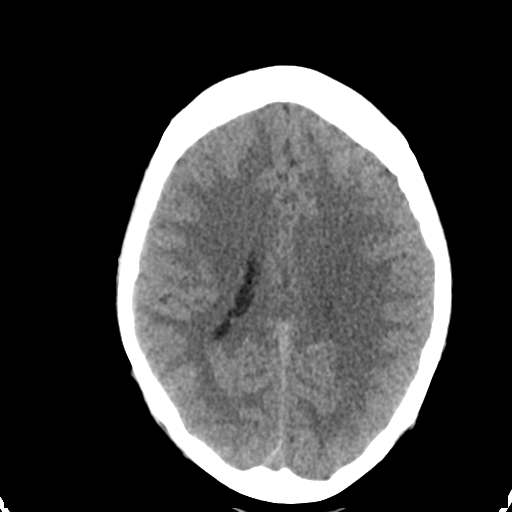
[im 20/30  brain]
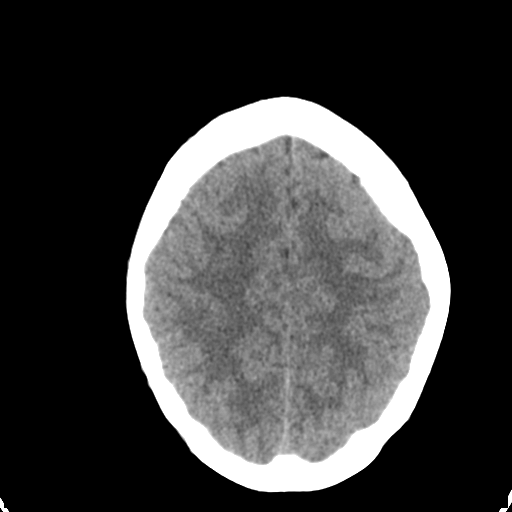
[im 23/30  brain]
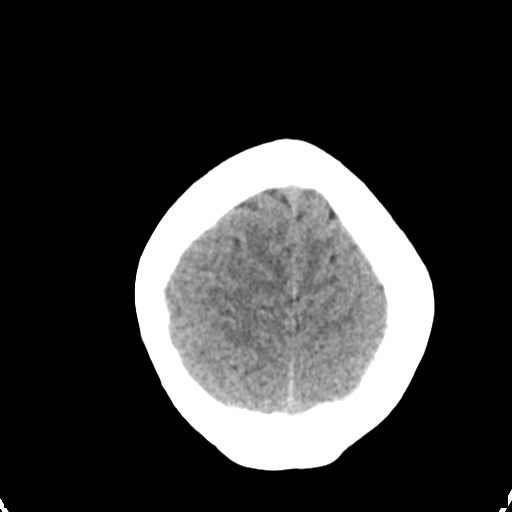
[im 25/30  brain]
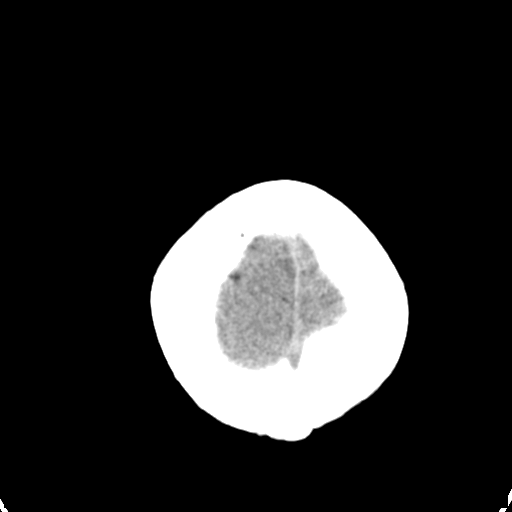
[im 25/30  bone]
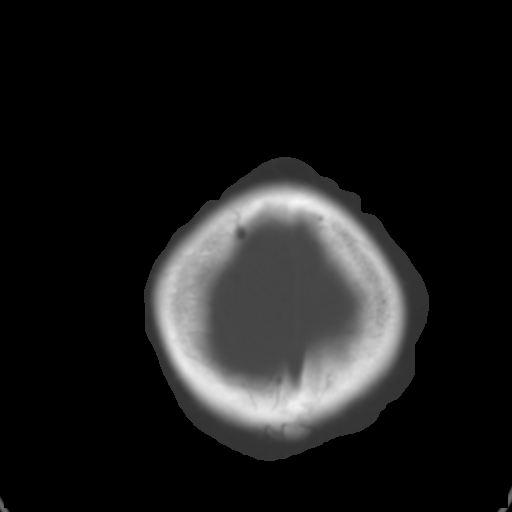
[im 28/30  brain]
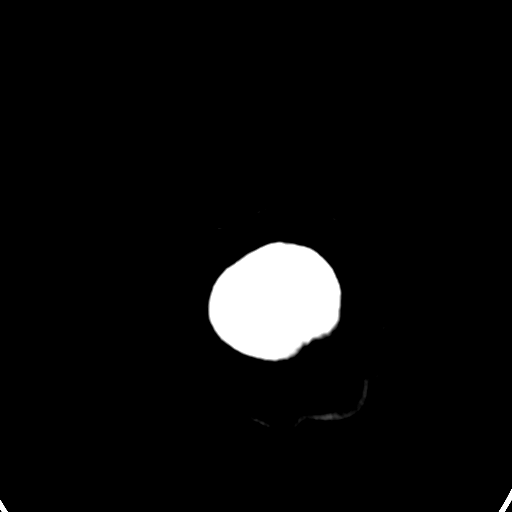

[Series 5: head 3.0 mpr cor · coronal · 0.34mm/px · 3 of 67 slices shown]
[im 23/67  brain]
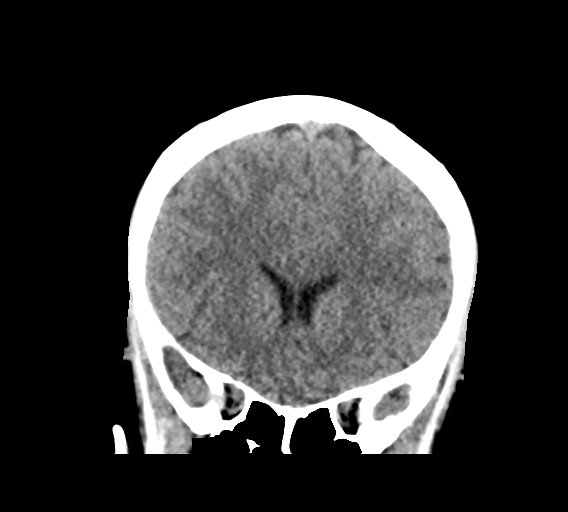
[im 30/67  brain]
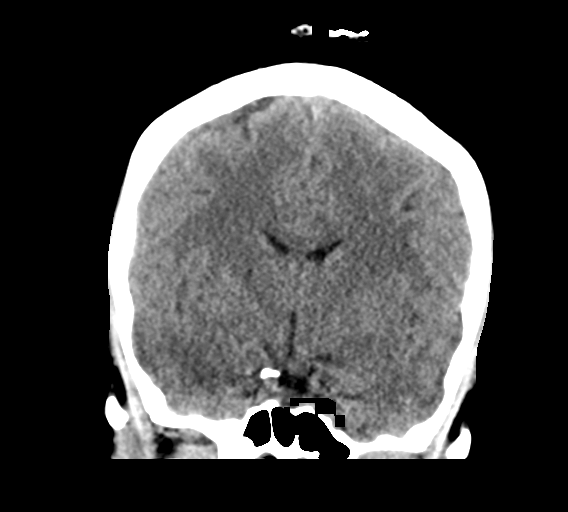
[im 37/67  brain]
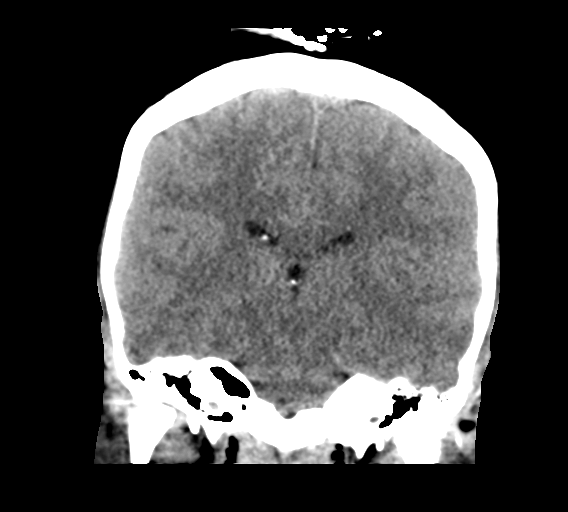

[Series 6: head 3.0 mpr sag · sagittal · 0.31mm/px · 3 of 64 slices shown]
[im 22/64  brain]
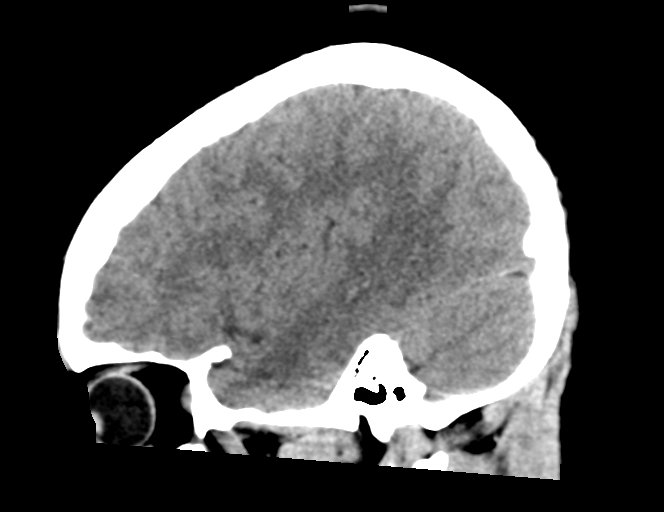
[im 32/64  brain]
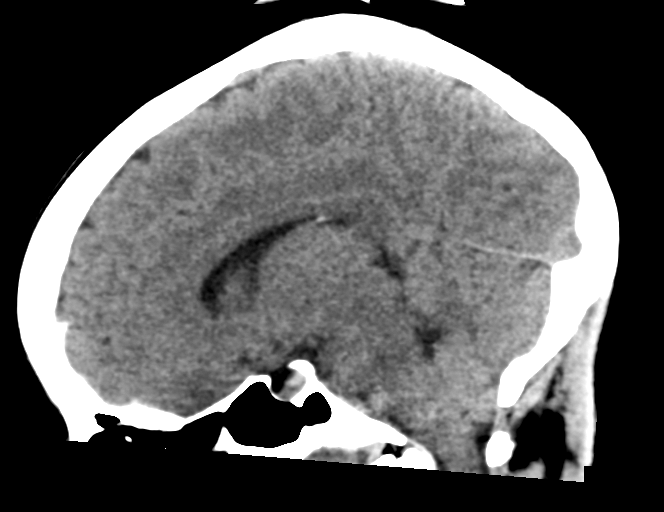
[im 43/64  brain]
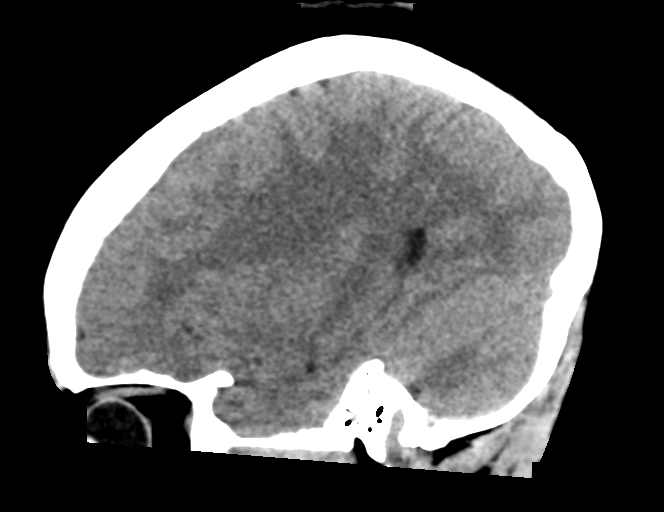

[16 of 47 positions shown; findings below may reference images not displayed]

FINDINGS: Brain: Appears normal without hemorrhage, infarct, mass lesion, mass
effect, midline shift or abnormal extra-axial fluid collection. No
hydrocephalus or pneumocephalus.

Vascular: Negative.

Skull: Intact.

Sinuses/Orbits: Negative.

Other: None.
IMPRESSION: Normal head CT.

## 2017-08-02 IMAGING — CT CT ABD-PELV W/ CM
2 of 5 series · 15 of 46 positions shown, 17 images · IV contrast (APPLIED)
Comparison: [DATE]

CLINICAL DATA: Lower abdominal pain beginning 2 days ago, greater
in the right lower quadrant today.

EXAM:
CT ABDOMEN AND PELVIS WITH CONTRAST
TECHNIQUE: Multidetector CT imaging of the abdomen and pelvis was performed
using the standard protocol following bolus administration of
intravenous contrast.
CONTRAST:  100mL [7T] IOPAMIDOL ([7T]) INJECTION 61%

[Series 3: abd/ pelvis 5.0 i30f 2 · axial · 0.69mm/px · z∈[-871,-446]mm · 12 of 95 slices shown, 14 images]
[im 5/95  soft-tissue]
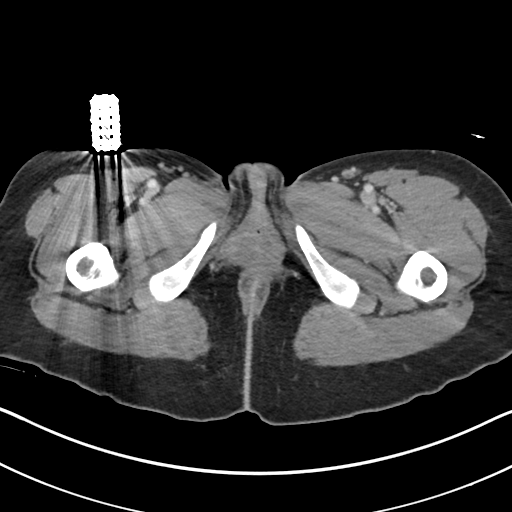
[im 5/95  bone]
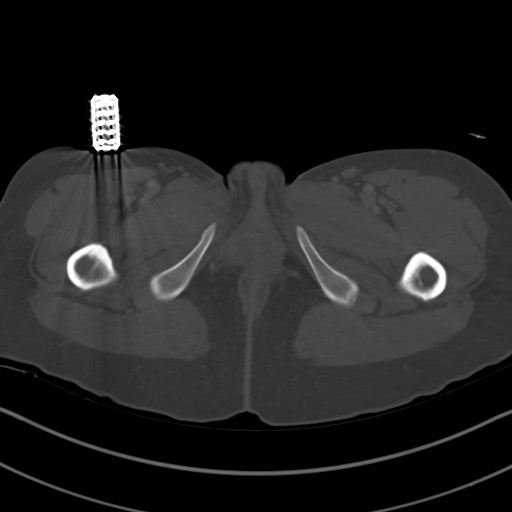
[im 15/95  soft-tissue]
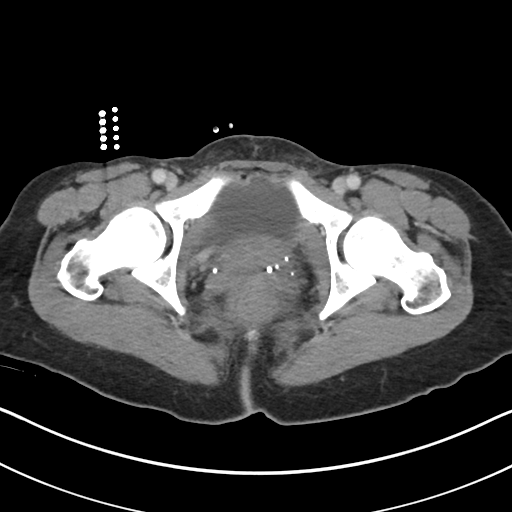
[im 19/95  soft-tissue]
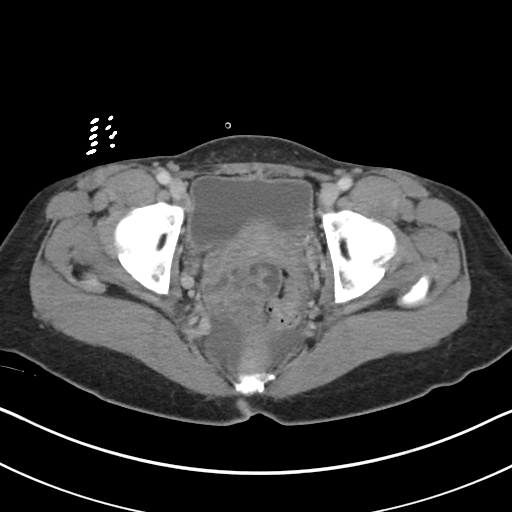
[im 29/95  soft-tissue]
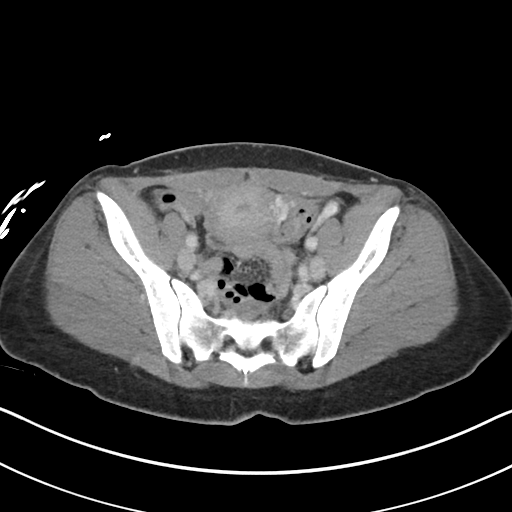
[im 38/95  soft-tissue]
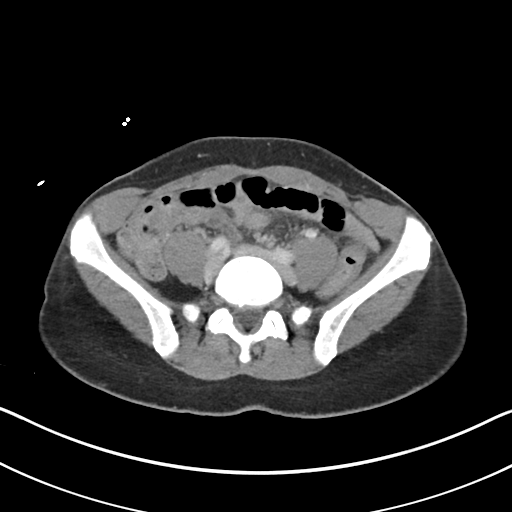
[im 43/95  soft-tissue]
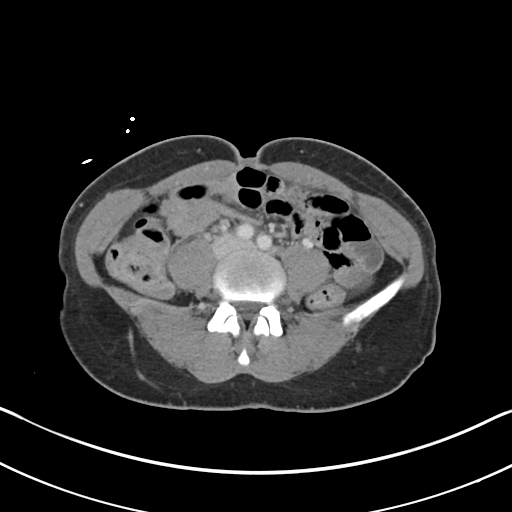
[im 52/95  soft-tissue]
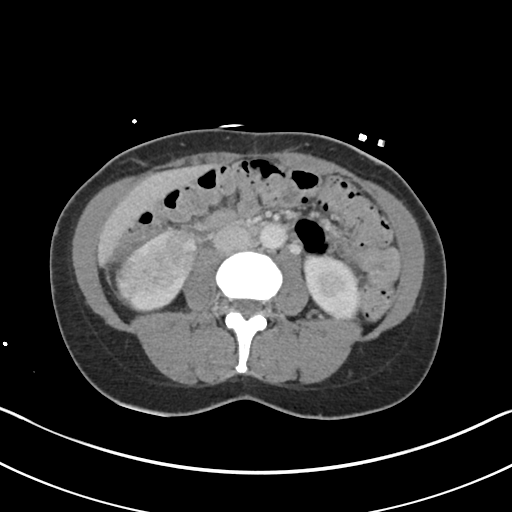
[im 57/95  soft-tissue]
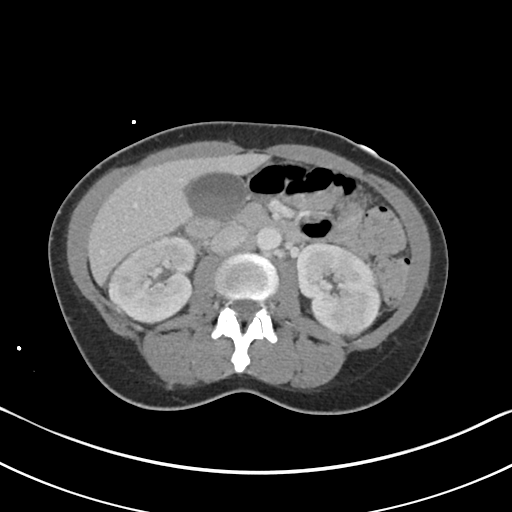
[im 66/95  soft-tissue]
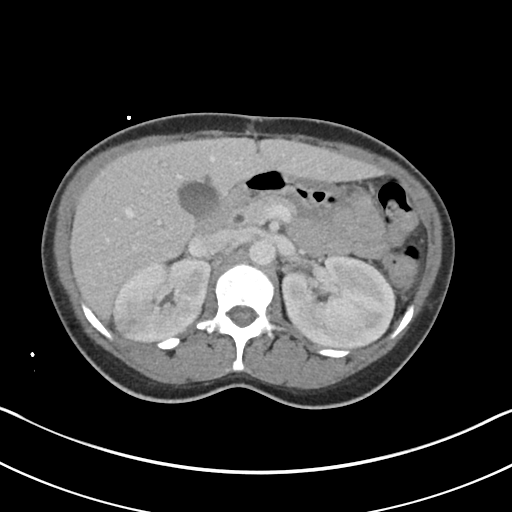
[im 66/95  bone]
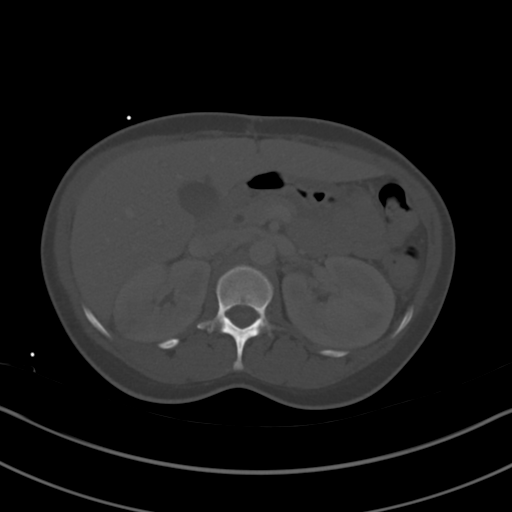
[im 76/95  soft-tissue]
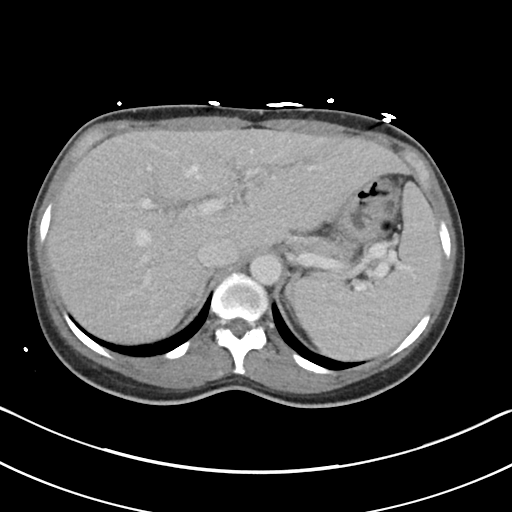
[im 80/95  soft-tissue]
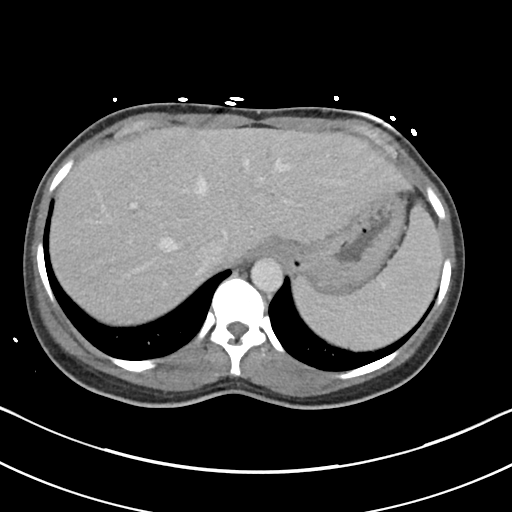
[im 90/95  soft-tissue]
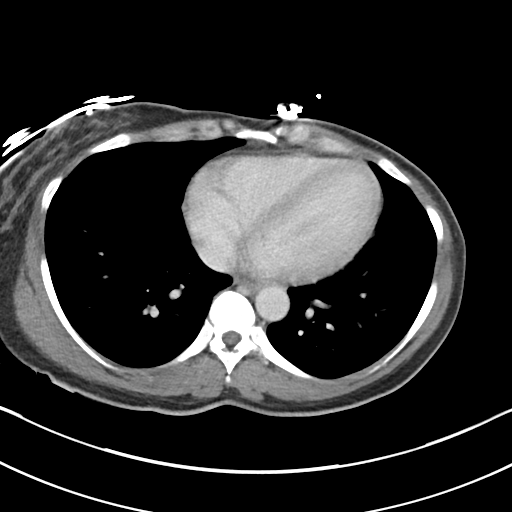

[Series 6: coronal soft tissue · coronal · 0.65mm/px · 3 of 91 slices shown]
[im 31/91  soft-tissue]
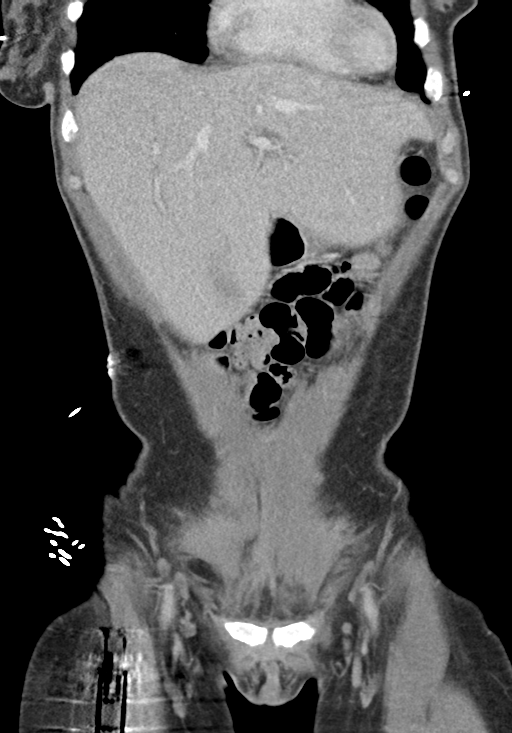
[im 41/91  soft-tissue]
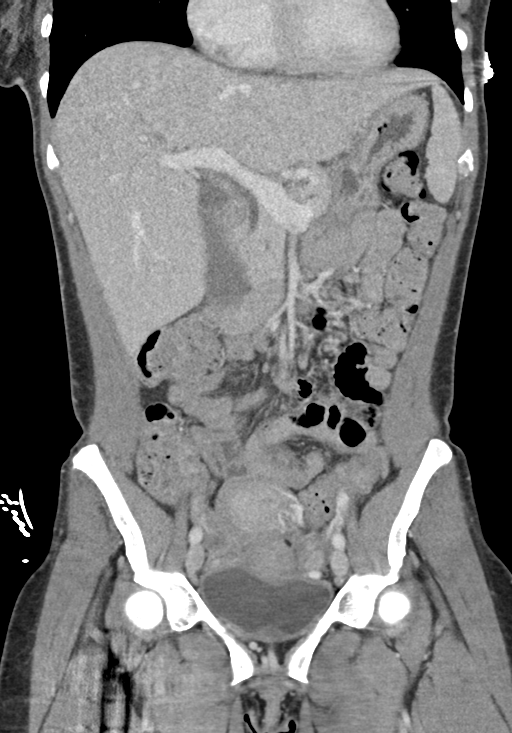
[im 51/91  soft-tissue]
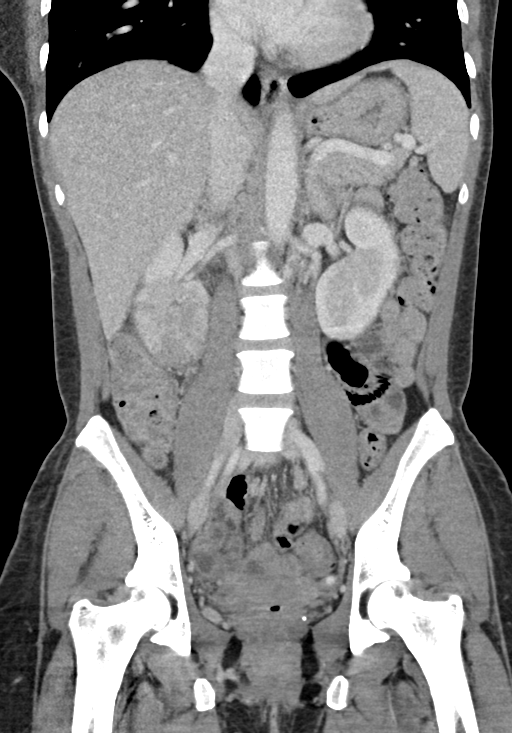

[15 of 46 positions shown; findings below may reference images not displayed]

FINDINGS: Lower chest: Minimal dependent atelectasis in the lung bases. No
pleural effusion.

Hepatobiliary: No focal liver abnormality is seen. The gallbladder
is moderately distended with possible mild wall thickening. No
calcified gallstones are identified, although there may be subtly
hyperattenuating material dependently in the gallbladder which could
reflect sludge. No biliary dilatation.

Pancreas: Unremarkable.

Spleen: Unremarkable.

Adrenals/Urinary Tract: Unremarkable adrenal glands. There is a
cm well-defined low density focus laterally in the interpolar left
kidney which is new. Additional patchy areas of more hypoenhancement
are present in both kidneys, with the most extensive involvement
being in the lower pole on the right. There is mild perinephric
stranding inferiorly on the right. There is no hydronephrosis. The
bladder is unremarkable.

Stomach/Bowel: The stomach is within normal limits. There is no
evidence of bowel obstruction. The appendix is not confidently
identified, however no significant inflammatory changes are seen
about the cecum to strongly suggest acute appendicitis.

Vascular/Lymphatic: Minimal atherosclerotic calcification of the
distal abdominal aorta. No enlarged lymph nodes.

Reproductive: Unremarkable uterus and right adnexa. Suspected left
ovarian corpus luteum.

Other: Small volume pelvic free fluid. Unchanged 14 mm hypodense
focus in the upper anterior abdominal wall which may reflect fluid
in a small ventral hernia.

Musculoskeletal: No acute osseous abnormality or suspicious osseous
lesion.
IMPRESSION: 1. Patchy hypoenhancement in both kidneys consistent with
pyelonephritis. Possible developing 1.6 cm abscess in the left
kidney.
2. Distended gallbladder with possible mild wall thickening. Right
upper quadrant ultrasound could be performed further evaluation as
clinically warranted. No calcified gallstones or biliary dilatation.

## 2017-08-02 IMAGING — DX DG CHEST 2V
2 series · 2 of 2 positions shown · non-contrast
Comparison: Radiograph [DATE].

CLINICAL DATA: Chest pain, fever, chills.

EXAM:
CHEST  2 VIEW

[chest lat]
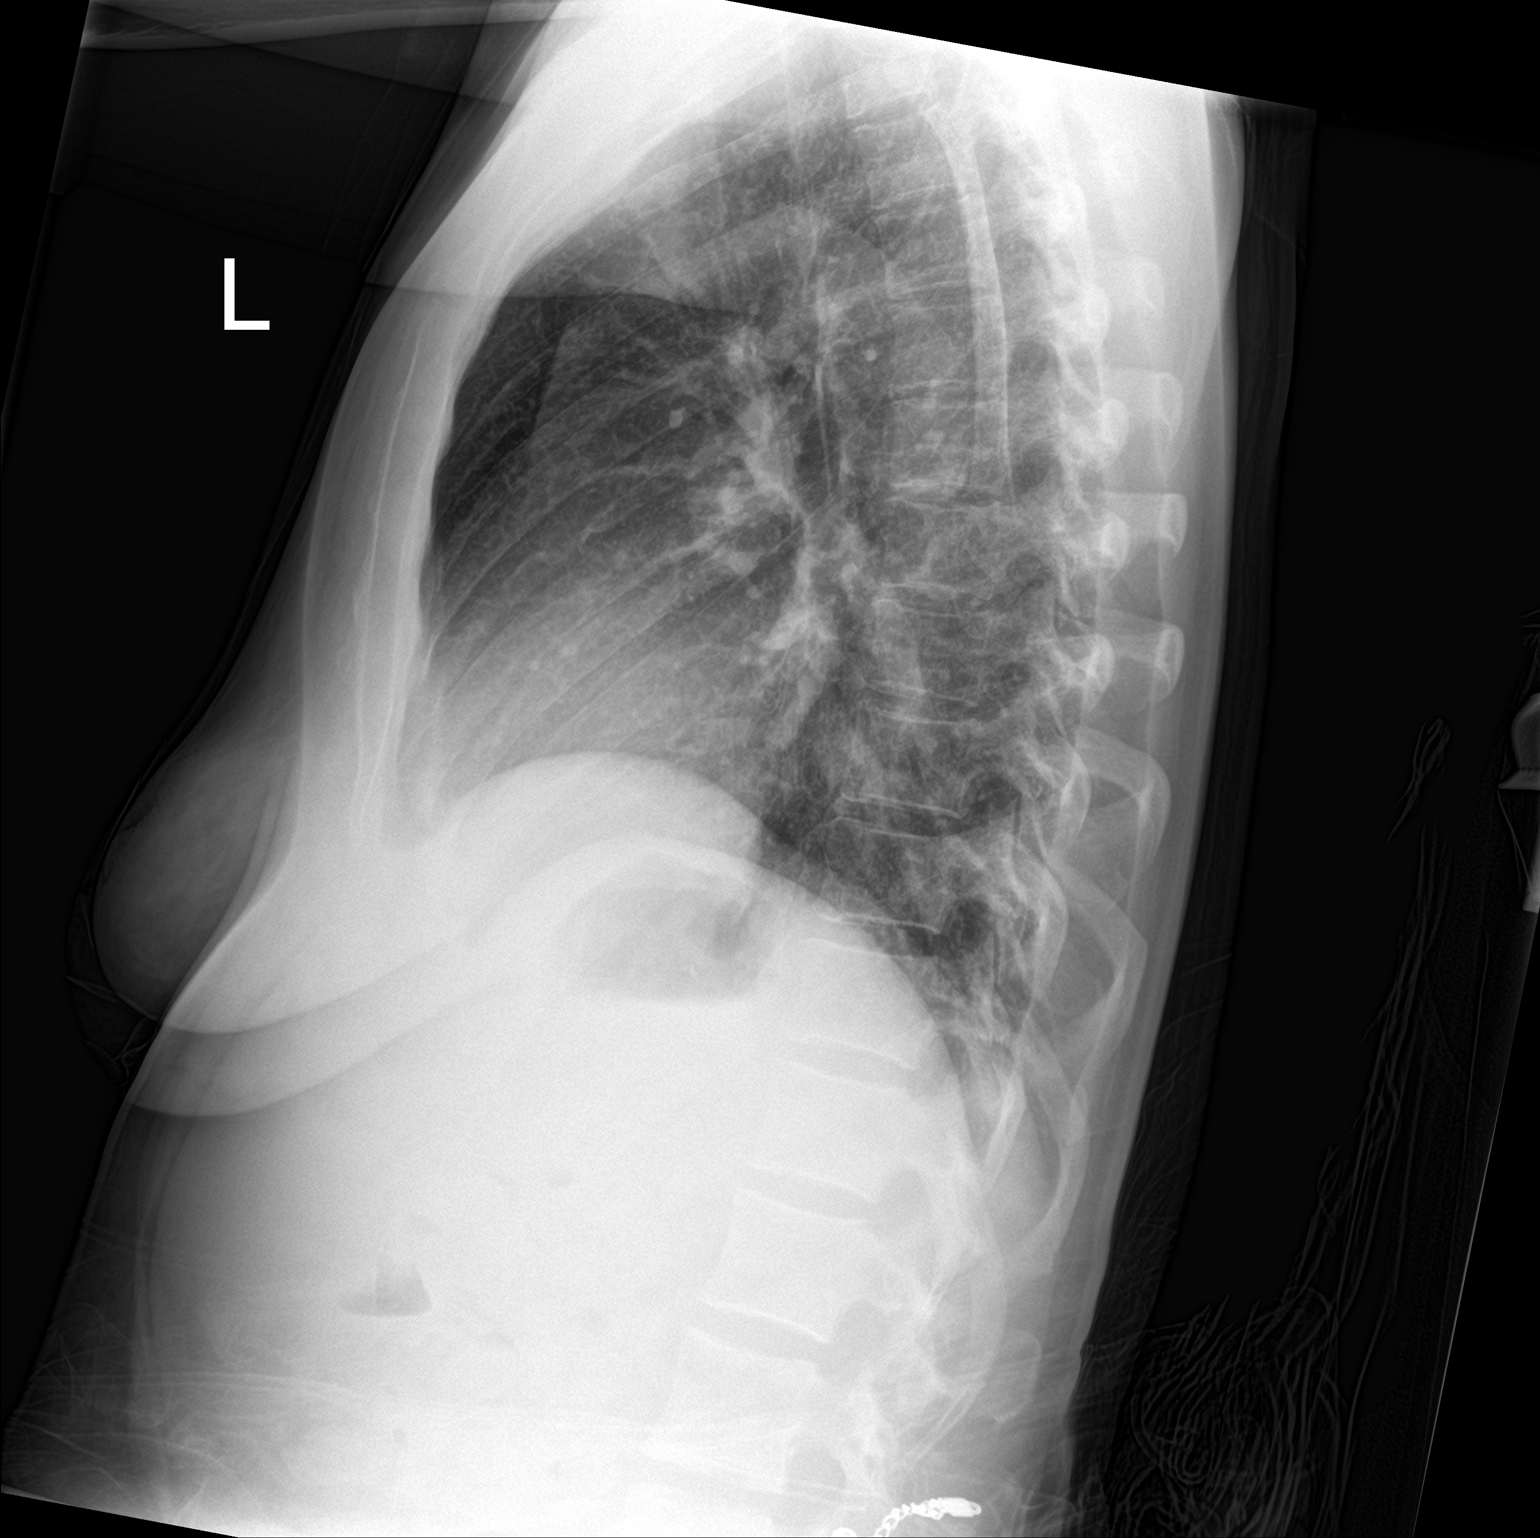

[chest ap]
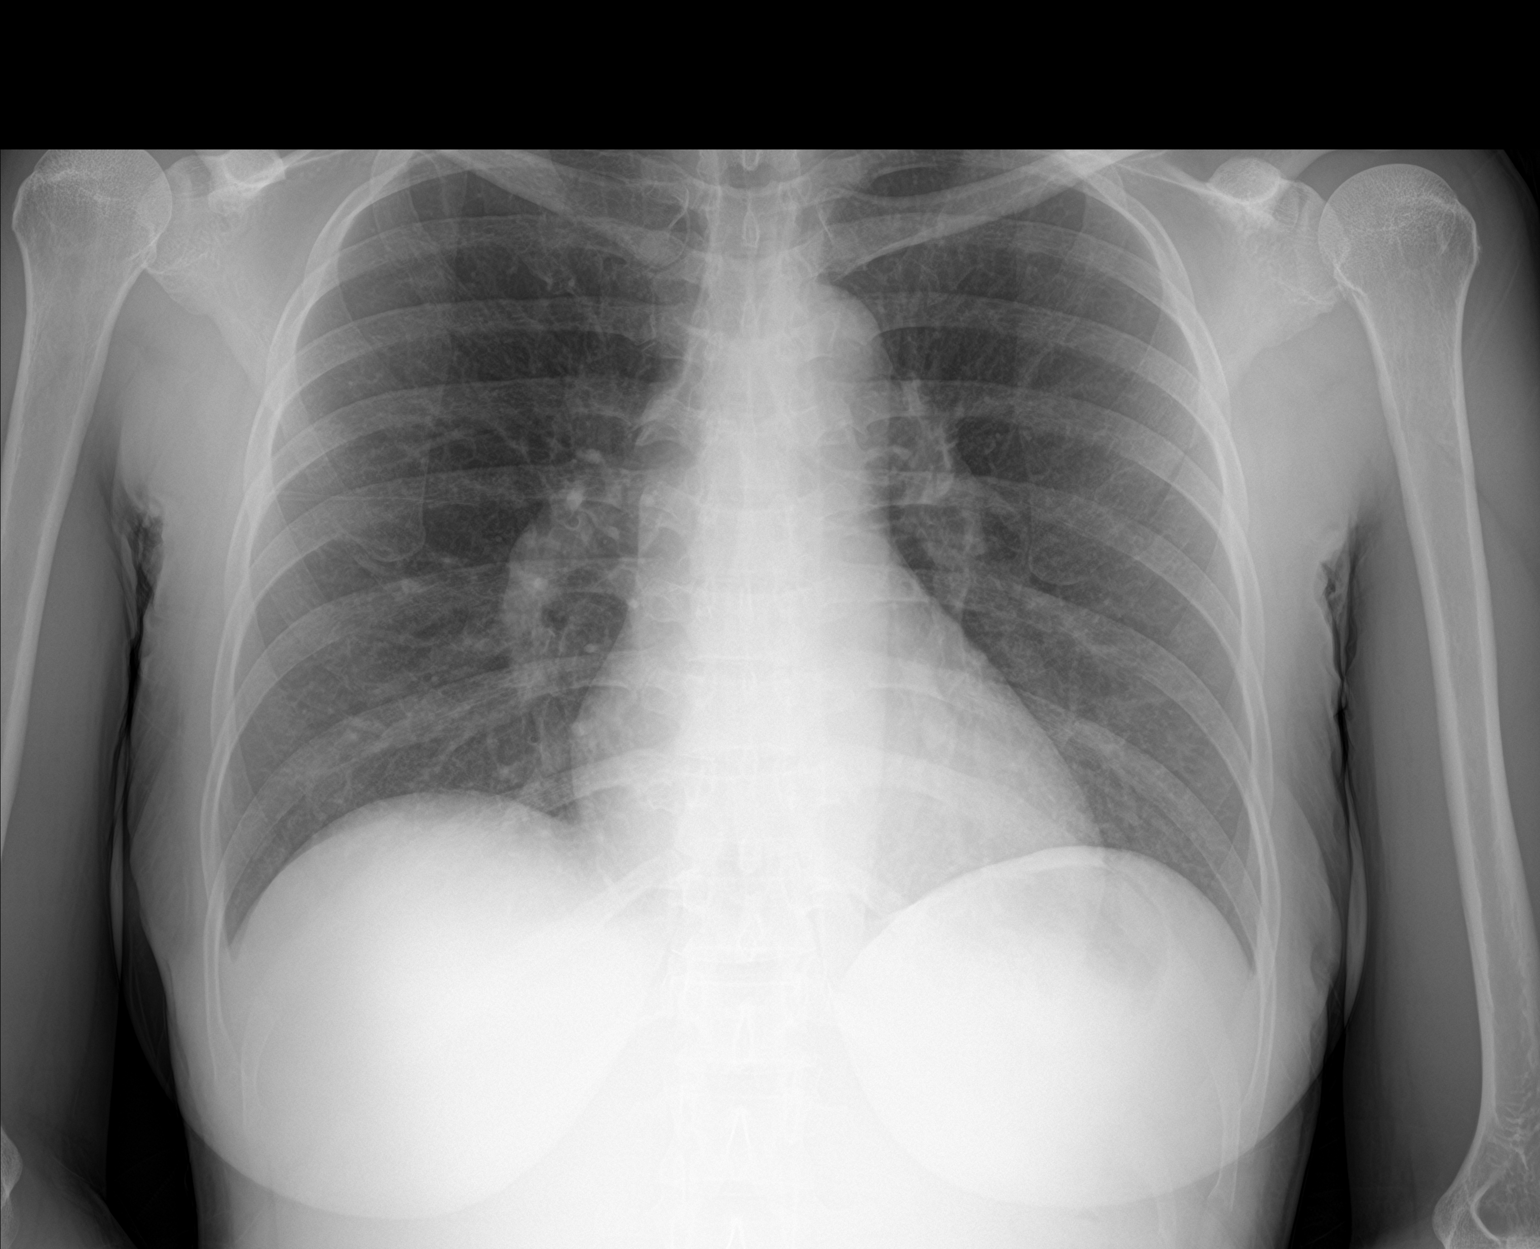

[2 of 2 positions shown; findings below may reference images not displayed]

FINDINGS: The heart size and mediastinal contours are within normal limits.
Both lungs are clear. No pneumothorax or pleural effusion is noted.
The visualized skeletal structures are unremarkable.
IMPRESSION: No active cardiopulmonary disease.

## 2017-08-02 MED ORDER — ACETAMINOPHEN 325 MG PO TABS
650.0000 mg | ORAL_TABLET | Freq: Once | ORAL | Status: AC | PRN
  Administered 2017-08-02: 650 mg via ORAL
  Filled 2017-08-02: qty 2

## 2017-08-02 MED ORDER — SODIUM CHLORIDE 0.9 % IV BOLUS (SEPSIS)
250.0000 mL | Freq: Once | INTRAVENOUS | Status: AC
  Administered 2017-08-02: 250 mL via INTRAVENOUS

## 2017-08-02 MED ORDER — HYDROCODONE-ACETAMINOPHEN 5-325 MG PO TABS: 1.0000 | ORAL_TABLET | Freq: Four times a day (QID) | ORAL | 0 refills | Status: DC | PRN

## 2017-08-02 MED ORDER — CEFTRIAXONE SODIUM 1 G IJ SOLR
1.0000 g | Freq: Once | INTRAMUSCULAR | Status: DC
  Filled 2017-08-02: qty 10

## 2017-08-02 MED ORDER — IOPAMIDOL (ISOVUE-300) INJECTION 61%
INTRAVENOUS | Status: AC
  Administered 2017-08-02: 100 mL
  Filled 2017-08-02: qty 100

## 2017-08-02 MED ORDER — SULFAMETHOXAZOLE-TRIMETHOPRIM 800-160 MG PO TABS: 1.0000 | ORAL_TABLET | Freq: Two times a day (BID) | ORAL | 0 refills | Status: DC

## 2017-08-02 MED ORDER — PHENAZOPYRIDINE HCL 200 MG PO TABS: 200.0000 mg | ORAL_TABLET | Freq: Three times a day (TID) | ORAL | 0 refills | Status: DC

## 2017-08-02 MED ORDER — PIPERACILLIN-TAZOBACTAM 3.375 G IVPB 30 MIN
3.3750 g | Freq: Once | INTRAVENOUS | Status: AC
  Administered 2017-08-02: 3.375 g via INTRAVENOUS
  Filled 2017-08-02: qty 50

## 2017-08-02 MED ORDER — SODIUM CHLORIDE 0.9 % IV BOLUS (SEPSIS)
500.0000 mL | Freq: Once | INTRAVENOUS | Status: AC
  Administered 2017-08-02: 500 mL via INTRAVENOUS

## 2017-08-02 MED ORDER — PIPERACILLIN-TAZOBACTAM 3.375 G IVPB: 3.3750 g | Freq: Three times a day (TID) | INTRAVENOUS | Status: DC

## 2017-08-02 MED ORDER — HYDROMORPHONE HCL 1 MG/ML IJ SOLN
1.0000 mg | Freq: Once | INTRAMUSCULAR | Status: AC
  Administered 2017-08-02: 1 mg via INTRAVENOUS
  Filled 2017-08-02: qty 1

## 2017-08-02 MED ORDER — SODIUM CHLORIDE 0.9 % IV BOLUS (SEPSIS)
1000.0000 mL | Freq: Once | INTRAVENOUS | Status: AC
  Administered 2017-08-02: 1000 mL via INTRAVENOUS

## 2017-08-02 MED ORDER — HYDROCODONE-ACETAMINOPHEN 5-325 MG PO TABS
1.0000 | ORAL_TABLET | Freq: Once | ORAL | Status: AC
  Administered 2017-08-02: 1 via ORAL
  Filled 2017-08-02: qty 1

## 2017-08-02 NOTE — ED Provider Notes (Signed)
[facility] EMERGENCY DEPARTMENT Provider Note   CSN: [id number] Arrival date & time: 08/02/17  1639     History   Chief Complaint Chief Complaint  Patient presents with  . Flank Pain  . Fever    HPI Shirley [name] is a 41 y.o. female with a history of kidney infections and migraines who presents to the emergency department today for fever and bilateral low back/flank pain that is worse on the right. Patient states over the last 4 days she has had dysuria, urinary frequency and increasing flank pain/low back pain that is worse on the right.  Over the last 2 days the pain is spread to the lower abdomen.  Patient has been taking cranberry tablets with relief of her dysuria.  Patient has a T-max at home was 50 F taken orally.  Nothing makes patient's symptoms better or worse.  She admits to chills.  Patient denies any chest pain, cough, nausea, vomiting, diarrhea, melena, hematochezia, lower extremity swelling.  Says that daughter was sick with URI like symptoms in previous weeks.    She currently has her typical migraine that began today.  Patient denies any neck stiffness, nausea, vomiting, chest pain, shortness of breath, diarrhea, hematochezia, melena, leg swelling, vaginal discharge or vaginal pain.  HPI  Past Medical History:  Diagnosis Date  . Anemia   . Blood dyscrasia    has to apply a lot of pressure to stop bleeding  . GERD (gastroesophageal reflux disease)   . Heart murmur    with pregnancy  . History of hiatal hernia   . Hypertension    4-5 years ago, no meds  . Kidney infection    patient states I might have a kidney infection  . Migraine    migraines    Patient Active Problem List   Diagnosis Date Noted  . Acute postoperative pain 07/31/2016  . Menometrorrhagia 04/09/2016  . Absolute anemia 04/09/2016  . Abnormal uterine bleeding (AUB) 04/09/2016  . Essential hypertension 11/17/2009    Past Surgical History:  Procedure Laterality Date    . ABDOMINAL HERNIA REPAIR    . CESAREAN SECTION     x 4  . CESAREAN SECTION     x 4  . ENDOMETRIAL ABLATION  07/31/2016  . HYSTEROSCOPY  07/31/2016   Procedure: HYSTEROSCOPY WITH HYDROTHERMAL ABLATION;  Surgeon: Big Sandy Bing, MD;  Location: [facility];  Service: Gynecology;;    OB History    Gravida Para Term Preterm AB Living   5 4 3 1 1 4    SAB TAB Ectopic Multiple Live Births                  Obstetric Comments   c--section x 4. 36wk c-section x 1       Home Medications    Prior to Admission medications   Medication Sig Start Date End Date Taking? Authorizing Provider  acetaminophen (TYLENOL) 500 MG tablet Take 1,000 mg by mouth every 6 (six) hours as needed for moderate pain or headache.    [provider]  albuterol (PROVENTIL HFA;VENTOLIN HFA) 108 (90 Base) MCG/ACT inhaler Inhale 2 puffs into the lungs every 6 (six) hours as needed for wheezing or shortness of breath.    [provider]  benzonatate (TESSALON) 100 MG capsule Take 1 capsule (100 mg total) by mouth every 8 (eight) hours. Patient not taking: Reported on 04/02/2017 10/29/16   Jaynie Crumble, PA-C  ciprofloxacin (CIPRO) 500 MG tablet Take 1 tablet (500  mg total) by mouth 2 (two) times daily. 04/02/17   Bethann BerkshireZammit, Joseph, MD  cloNIDine (CATAPRES) 0.1 MG tablet Take 0.1 mg by mouth daily.    [provider]  diphenhydrAMINE (BENADRYL) 25 MG tablet Take 25 mg by mouth every 6 (six) hours as needed for allergies.    [provider]  HYDROmorphone (DILAUDID) 2 MG tablet Take 1 tablet (2 mg total) by mouth every 4 (four) hours as needed for severe pain. Patient not taking: Reported on 08/30/2016 08/01/16   Tereso NewcomerAnyanwu, Ugonna A, MD  Iron-FA-B Cmp-C-Biot-Probiotic (FUSION PLUS) CAPS Take 1 capsule by mouth daily.    [provider]  lisinopril (PRINIVIL,ZESTRIL) 20 MG tablet TAKE 1 TABLET (20 MG TOTAL) BY MOUTH DAILY. Patient not taking: Reported on 04/02/2017 01/14/17   Anyanwu,  Jethro BastosUgonna A, MD  metroNIDAZOLE (FLAGYL) 500 MG tablet Take 1 tablet (500 mg total) by mouth 3 (three) times daily. One po bid x 7 days Patient not taking: Reported on 05/09/2017 04/02/17   Bethann BerkshireZammit, Joseph, MD  omeprazole (PRILOSEC) 20 MG capsule Take 1 capsule (20 mg total) by mouth daily. Patient taking differently: Take 20 mg by mouth 2 (two) times daily before a meal.  10/17/15   Barrett, Stevi, PA-C  ondansetron (ZOFRAN) 4 MG tablet Take 1 tablet (4 mg total) by mouth every 6 (six) hours. Patient not taking: Reported on 05/09/2017 04/02/17   Bethann BerkshireZammit, Joseph, MD  ondansetron (ZOFRAN-ODT) 8 MG disintegrating tablet Take 8 mg by mouth every 8 (eight) hours as needed for nausea or vomiting.    [provider]  oxyCODONE-acetaminophen (PERCOCET) 5-325 MG tablet Take 1 tablet by mouth every 6 (six) hours as needed. 04/02/17   Bethann BerkshireZammit, Joseph, MD  polyethylene glycol powder [facility](MIRALAX) powder 1 capful daily until normal bowel movements resume 08/06/16   Marny LowensteinWenzel, Julie N, PA-C  predniSONE (DELTASONE) 20 MG tablet Take 2 tablets (40 mg total) by mouth daily. Patient not taking: Reported on 04/02/2017 10/29/16   Jaynie CrumbleKirichenko, Tatyana, PA-C  traMADol (ULTRAM) 50 MG tablet Take 1 tablet (50 mg total) by mouth every 6 (six) hours as needed for severe pain. Patient not taking: Reported on 08/30/2016 03/05/16   Tereso NewcomerAnyanwu, Ugonna A, MD    Family History Family History  Problem Relation Age of Onset  . Hypertension Mother   . Colon cancer Father 2256    Social History Social History   Tobacco Use  . Smoking status: Current Every Day Smoker    Packs/day: 0.50    Types: Cigarettes  . Smokeless tobacco: Never Used  Substance Use Topics  . Alcohol use: No    Comment: occasionally  . Drug use: No     Allergies   Lisinopril; Aspirin; and Ibuprofen   Review of Systems Review of Systems  All other systems reviewed and are negative.    Physical Exam Updated Vital Signs BP (!) 157/96   Pulse (!) 118    Temp (!) 101.6 F (38.7 C) (Oral)   Resp (!) 22   Ht 5\' 2"  (1.575 m)   Wt 56.7 kg (125 lb)   SpO2 100%   BMI 22.86 kg/m   Physical Exam  Constitutional: She appears well-developed and well-nourished.  HENT:  Head: Normocephalic and atraumatic.  Right Ear: External ear normal.  Left Ear: External ear normal.  Nose: Nose normal.  Mouth/Throat: Uvula is midline, oropharynx is clear and moist and mucous membranes are normal. No tonsillar exudate.  Eyes: Pupils are equal, round, and reactive to light. Right  eye exhibits no discharge. Left eye exhibits no discharge. No scleral icterus.  Neck: Trachea normal. Neck supple. No spinous process tenderness present. No neck rigidity. Normal range of motion present.  No meningismus  Cardiovascular: Normal rate, regular rhythm and intact distal pulses.  No murmur heard. Pulses:      Radial pulses are 2+ on the right side, and 2+ on the left side.       Dorsalis pedis pulses are 2+ on the right side, and 2+ on the left side.       Posterior tibial pulses are 2+ on the right side, and 2+ on the left side.  No lower extremity swelling or edema. Calves symmetric in size bilaterally.  Pulmonary/Chest: Effort normal and breath sounds normal. She exhibits no tenderness.  Abdominal: Soft. Normal appearance and bowel sounds are normal. She exhibits no distension. There is generalized tenderness and tenderness in the right lower quadrant and suprapubic area. There is CVA tenderness (b/l worse on the right). There is no rigidity, no rebound and no guarding.  Musculoskeletal: She exhibits no edema.  Lymphadenopathy:    She has no cervical adenopathy.  Neurological: She is alert.  Speech clear. Follows commands. No facial droop. PERRLA. EOMI. Normal peripheral fields. CN III-XII intact.  Grossly moves all extremities 4 without ataxia. Coordination intact. Able and appropriate strength for age to upper and lower extremities bilaterally including grip  strength. Sensation to light touch intact bilaterally for upper and lower. Normal finger to nose and rapid alternating movements. No pronator drift.   Skin: Skin is warm and dry. No rash noted. She is not diaphoretic.  Psychiatric: She has a normal mood and affect.  Nursing note and vitals reviewed.     ED Treatments / Results  Labs (all labs ordered are listed, but only abnormal results are displayed) Labs Reviewed  COMPREHENSIVE METABOLIC PANEL - Abnormal; Notable for the following components:      Result Value   Sodium 134 (*)    Potassium 3.4 (*)    CO2 18 (*)    Glucose, Bld 104 (*)    Creatinine, Ser 1.01 (*)    Calcium 8.8 (*)    Albumin 3.4 (*)    All other components within normal limits  CBC WITH DIFFERENTIAL/PLATELET - Abnormal; Notable for the following components:   WBC 19.7 (*)    RBC 3.60 (*)    Hemoglobin 11.5 (*)    HCT 33.5 (*)    Neutro Abs 17.9 (*)    All other components within normal limits  URINALYSIS, ROUTINE W REFLEX MICROSCOPIC - Abnormal; Notable for the following components:   APPearance HAZY (*)    Hgb urine dipstick MODERATE (*)    Ketones, ur 5 (*)    Protein, ur 100 (*)    Leukocytes, UA MODERATE (*)    Bacteria, UA RARE (*)    Squamous Epithelial / LPF 0-5 (*)    All other components within normal limits  CULTURE, BLOOD (ROUTINE X 2)  CULTURE, BLOOD (ROUTINE X 2)  URINE CULTURE  PROTIME-INR  LIPASE, BLOOD  I-STAT CG4 LACTIC ACID, ED  I-STAT CG4 LACTIC ACID, ED  I-STAT CG4 LACTIC ACID, ED    EKG  EKG Interpretation None       Radiology Dg Chest 2 View  Result Date: 08/02/2017 CLINICAL DATA:  Chest pain, fever, chills. EXAM: CHEST  2 VIEW COMPARISON:  Radiograph of May 09, 2017. FINDINGS: The heart size and mediastinal contours are within normal limits.  Both lungs are clear. No pneumothorax or pleural effusion is noted. The visualized skeletal structures are unremarkable. IMPRESSION: No active cardiopulmonary disease.  Electronically Signed   By: Lupita Raider, M.D.   On: 08/02/2017 17:23   Ct Head Wo Contrast  Result Date: 08/02/2017 CLINICAL DATA:  Lightheadedness and headache for 3 days. EXAM: CT HEAD WITHOUT CONTRAST TECHNIQUE: Contiguous axial images were obtained from the base of the skull through the vertex without intravenous contrast. COMPARISON:  Head CT scan 05/09/2017 02/12/2007. FINDINGS: Brain: Appears normal without hemorrhage, infarct, mass lesion, mass effect, midline shift or abnormal extra-axial fluid collection. No hydrocephalus or pneumocephalus. Vascular: Negative. Skull: Intact. Sinuses/Orbits: Negative. Other: None. IMPRESSION: Normal head CT. Electronically Signed   By: Drusilla Kanner M.D.   On: 08/02/2017 19:08   Ct Abdomen Pelvis W Contrast  Result Date: 08/02/2017 CLINICAL DATA:  Lower abdominal pain beginning 2 days ago, greater in the right lower quadrant today. EXAM: CT ABDOMEN AND PELVIS WITH CONTRAST TECHNIQUE: Multidetector CT imaging of the abdomen and pelvis was performed using the standard protocol following bolus administration of intravenous contrast. CONTRAST:  ISOVUE-300 IOPAMIDOL (ISOVUE-300) INJECTION 61% COMPARISON:  04/02/2017 FINDINGS: Lower chest: Minimal dependent atelectasis in the lung bases. No pleural effusion. Hepatobiliary: No focal liver abnormality is seen. The gallbladder is moderately distended with possible mild wall thickening. No calcified gallstones are identified, although there may be subtly hyperattenuating material dependently in the gallbladder which could reflect sludge. No biliary dilatation. Pancreas: Unremarkable. Spleen: Unremarkable. Adrenals/Urinary Tract: Unremarkable adrenal glands. There is a 1.6 cm well-defined low density focus laterally in the interpolar left kidney which is new. Additional patchy areas of more hypoenhancement are present in both kidneys, with the most extensive involvement being in the lower pole on the right.  There is mild perinephric stranding inferiorly on the right. There is no hydronephrosis. The bladder is unremarkable. Stomach/Bowel: The stomach is within normal limits. There is no evidence of bowel obstruction. The appendix is not confidently identified, however no significant inflammatory changes are seen about the cecum to strongly suggest acute appendicitis. Vascular/Lymphatic: Minimal atherosclerotic calcification of the distal abdominal aorta. No enlarged lymph nodes. Reproductive: Unremarkable uterus and right adnexa. Suspected left ovarian corpus luteum. Other: Small volume pelvic free fluid. Unchanged 14 mm hypodense focus in the upper anterior abdominal wall which may reflect fluid in a small ventral hernia. Musculoskeletal: No acute osseous abnormality or suspicious osseous lesion. IMPRESSION: 1. Patchy hypoenhancement in both kidneys consistent with pyelonephritis. Possible developing 1.6 cm abscess in the left kidney. 2. Distended gallbladder with possible mild wall thickening. Right upper quadrant ultrasound could be performed further evaluation as clinically warranted. No calcified gallstones or biliary dilatation. Electronically Signed   By: Sebastian Ache M.D.   On: 08/02/2017 19:26    Procedures Procedures (including critical care time)  Medications Ordered in ED Medications  sodium chloride 0.9 % bolus 1,000 mL (not administered)    And  sodium chloride 0.9 % bolus 500 mL (not administered)    And  sodium chloride 0.9 % bolus 250 mL (not administered)  piperacillin-tazobactam (ZOSYN) IVPB 3.375 g (not administered)  HYDROmorphone (DILAUDID) injection 1 mg (not administered)  acetaminophen (TYLENOL) tablet 650 mg (650 mg Oral Given 08/02/17 1658)     Initial Impression / Assessment and Plan / ED Course  I have reviewed the triage vital signs and the nursing notes.  Pertinent labs & imaging results that were available during my care of the patient were reviewed  by me and  considered in my medical decision making (see chart for details).     41 year old female presenting with bilateral back/flank pain (worse on the right side), urinary frequency, dysuria and fevers at home.  Patient is found to be febrile at 101.6 F, tachycardic at 118, tachypneic at 24 breaths/min on presentation.  There is no hypotension or hypoxia.  Exam shows bilateral CVA tenderness, worse on the right.  There is severe tenderness of the lower abdomen, worse on the right side.  Code sepsis called.  Patient given antibiotics per protocol.  IV fluid and pain medicine initiated.  UA, lactic acid, blood cultures, CBC, CMP, lipase, EKG, chest x-ray, CT abdomen ordered.   Patient has leukocytosis of 19.7 with shift.  Mild hyponatremia and hypokalemia.  No anion gap acidosis.  Creatinine slightly elevated at 1.01.  Does not meet criteria for AKI.  Lipase within normal limits and not suggestive of pancreatitis.  Lactic acid within normal limits.  Chest x-ray had evidence of cardiopulmonary disease.  ECG with sinus tachycardia without signs of ischemia.  UA with signs of infection, with moderate leukocytes and too numerous to count white blood cells.  Will send for culture.  CT shows evidence of pyelonephritis.  There is also evidence of a distended gallbladder with possible mild wall thickening.  There is no calcified gallstones or biliary dilatation.  Feel right upper quadrant ultrasound is not needed to evaluate at this time as the patient has normal LFTs and is without focal TTP of the RUQ.  There is also noted to be a possible developing 1.6 cm abscess in the left kidney.  I thoroughly discussed inpatient versus outpatient treatment for patient's pyelonephritis and possible findings on CT.  After much discussion the patient wishes to be treated on an outpatient basis with strict return precautions.  She is aware of her CT findings and will return for any worsening signs or symptoms.  I will discharge the  patient home on 14-day course of Bactrim.  Will provide patient with pain medication at home for pain.  She is to take ibuprofen/Tylenol for fevers. She is in agreement with plan and appears safe for discharge.   Patient case seen and discussed with Dr. Ethelda ChickJacubowitz who is in agreement with plan.   Final Clinical Impressions(s) / ED Diagnoses   Final diagnoses:  Pyelonephritis    ED Discharge Orders        Ordered    sulfamethoxazole-trimethoprim (BACTRIM DS,SEPTRA DS) 800-160 MG tablet  2 times daily     08/02/17 2029    HYDROcodone-acetaminophen (NORCO/VICODIN) 5-325 MG tablet  Every 6 hours PRN     08/02/17 2029    phenazopyridine (PYRIDIUM) 200 MG tablet  3 times daily     08/02/17 2029       Princella PellegriniMaczis, Essynce Munsch M, PA-C 08/03/17 0030    Doug SouJacubowitz, Sam, MD 08/03/17 [id number]

## 2017-08-02 NOTE — Discharge Instructions (Signed)
Please read and follow all provided instructions You have been seen today for your complaint of pain with urination, back pain, and fever.  Your lab work showed urine infection and Ct scan showed evidence of kidney infection, also known as pyelonephritis.  I have attached a handout on pyelonephritis for your information.   Your discharge medications include: 1) Bactrim Please take all of your antibiotics until finished!   You may develop abdominal discomfort or diarrhea from the antibiotic.  You may help offset this with probiotics which you can buy or get in yogurt. Do not eat or take the probiotics until 2 hours after your antibiotic. Do not take your medicine if develop an itchy rash, swelling in your mouth or lips, or difficulty breathing.  2) Pyridium  This medication will help relieve pain and burning but does not treat the infection.  Make sure that you wear a panty liner as it may stain your underwear. Do not be alarmed if this turns your urine orange. To void upset stomach please take with food. 3) For pain and fever please take Ibuprofen and Tylenol. You can taken take: 800mg  of ibuprofen (that is usually four 200mg  over the counter pills) up to 3 times a day (please take with food) and acetaminophen 650mg  (this is 2 normal strength, 325mg , over the counter pills) up to four times a day. Please do not take more than this. Do not drink alcohol or combine with other medications that have acetaminophen or Ibuprofen as an ingredient (Read the labels!). Do not take more then 4000mg  of tylenol in a day.   For breakthrough pain you may take Norco (please note this medication has tylenol in it). Do not drink alcohol drive or operate heavy machinery when taking. You are being provided a prescription for opiates (also known as narcotics) for pain control on an ?as needed? basis.  Opiates can be addictive and should only be used when absolutely necessary for pain control when other alternatives do not  work.  We recommend you only use them for the recommended amount of time and only as prescribed.  Please do not take with other sedative medications or alcohol.  Please do not drive, operate machinery, or make important decisions while taking opiates.  Please note that these medications can be addictive and have high abuse potential.  Please keep these medications locked away from children, teenagers or any family members with history of substance abuse. Additionally, these medications may cause constipation - take over the counter stool softeners or add fiber to your diet to treat this (Metamucil, Psyllium Fiber, Colace, Miralax) Further refills will need to be obtained from your primary care doctor and will not be prescribed through the Emergency Department. You will test positive on most drug tests while taking this medication.   Home care instructions are as follows:  1) Please drink plenty of water. Avoid tea and beverages with caffeine like coffee or soda 2) If you are sexually active, make sure to urinate immediately after intercourse.  Follow up:  Please follow up with your primary care physician in 1-2 days. If you do not have one please call the [facility] number listed above. Please seek immediate medical care if you develop any of the following symptoms: SEEK MEDICAL CARE IF:  You have back pain.  You develop a fever.  Your symptoms do not begin to resolve within 3 days.  SEEK IMMEDIATE MEDICAL CARE IF:  You have severe back pain or  lower abdominal pain.  You develop chills.  You have nausea or vomiting.  You have continued burning or discomfort with urination even after completion of antibiotic. Additional Information:  Your vital signs today were: BP 126/83    Pulse 97    Temp (!) 101.6 F (38.7 C) (Oral)    Resp (!) 21    Ht 5\' 2"  (1.575 m)    Wt 56.7 kg (125 lb)    SpO2 100%    BMI 22.86 kg/m  If your blood pressure (BP) was elevated above 135/85 this  visit, please have this repeated by your doctor within one month. ---------------

## 2017-08-02 NOTE — ED Provider Notes (Signed)
End of bilateral low back pain approximately 1 week ago.  Pain is spread to lower abdomen.  She denies any cough.  She does admit to some dysuria approximately 3 days ago.  No treatment prior to coming here.  No other associated symptoms.  No cough.  Nothing makes symptoms better or worse.  On exam alert Glasgow Coma Score lungs clear to auscultation heart regular rate and rhythm back she is tender bilaterally at flanks.  No midline tenderness. Abdomen distended.  Tender at lower quadrants bilaterally.  No guarding rigidity or rebound   Doug SouJacubowitz, Meggie Laseter, MD 08/03/17 0006

## 2017-08-02 NOTE — Progress Notes (Signed)
Pharmacy Antibiotic Note  [name] is a 41 y.o. female admitted on 08/02/2017 with Intra-abdominal infection.  Pharmacy has been consulted for Zosyn dosing. Tmax 101.6, WBC 19.7, LA 0.97. SCr 1.01 on admit, CrCl~55-60.  Plan: Zosyn 3.375g IV (30min inf) x1; then 3.375g IV q8h (4h inf) Monitor clinical progress, c/s, renal function F/u de-escalation plan/LOT  Height: 5\' 2"  (157.5 cm) Weight: 125 lb (56.7 kg) IBW/kg (Calculated) : 50.1  Temp (24hrs), Avg:101.6 F (38.7 C), Min:101.6 F (38.7 C), Max:101.6 F (38.7 C)  Recent Labs  Lab 08/02/17 1703 08/02/17 1724  WBC 19.7*  --   LATICACIDVEN  --  0.97    CrCl cannot be calculated (Patient's most recent lab result is older than the maximum 21 days allowed.).    Allergies  Allergen Reactions  . Lisinopril Anaphylaxis    Feet, hands, and throat became swollen  . Aspirin Nausea Only    Makes stomach burn  . Ibuprofen Nausea Only    Makes stomach burn    Babs BertinHaley Dagmawi Venable, PharmD, BCPS Clinical Pharmacist 08/02/2017 5:49 PM

## 2017-08-02 NOTE — ED Notes (Signed)
Pt went to CT

## 2017-08-03 ENCOUNTER — Other Ambulatory Visit: Payer: Self-pay

## 2017-08-03 ENCOUNTER — Other Ambulatory Visit (HOSPITAL_COMMUNITY): Payer: BLUE CROSS/BLUE SHIELD

## 2017-08-03 ENCOUNTER — Telehealth (HOSPITAL_BASED_OUTPATIENT_CLINIC_OR_DEPARTMENT_OTHER): Payer: Self-pay | Admitting: Emergency Medicine

## 2017-08-03 ENCOUNTER — Inpatient Hospital Stay (HOSPITAL_COMMUNITY)
Admission: EM | Admit: 2017-08-03 | Discharge: 2017-08-08 | DRG: 871 | Disposition: A | Discharge status: Home / Self Care | Payer: BLUE CROSS/BLUE SHIELD | Attending: Internal Medicine | Admitting: Internal Medicine

## 2017-08-03 ENCOUNTER — Observation Stay (HOSPITAL_COMMUNITY): Payer: BLUE CROSS/BLUE SHIELD

## 2017-08-03 ENCOUNTER — Encounter (HOSPITAL_COMMUNITY): Payer: Self-pay

## 2017-08-03 DIAGNOSIS — Z720 Tobacco use: Secondary | ICD-10-CM | POA: Diagnosis not present

## 2017-08-03 DIAGNOSIS — Z886 Allergy status to analgesic agent status: Secondary | ICD-10-CM

## 2017-08-03 DIAGNOSIS — A4151 Sepsis due to Escherichia coli [E. coli]: Secondary | ICD-10-CM | POA: Diagnosis not present

## 2017-08-03 DIAGNOSIS — I10 Essential (primary) hypertension: Secondary | ICD-10-CM | POA: Diagnosis present

## 2017-08-03 DIAGNOSIS — Z8711 Personal history of peptic ulcer disease: Secondary | ICD-10-CM

## 2017-08-03 DIAGNOSIS — K219 Gastro-esophageal reflux disease without esophagitis: Secondary | ICD-10-CM | POA: Diagnosis present

## 2017-08-03 DIAGNOSIS — R52 Pain, unspecified: Secondary | ICD-10-CM

## 2017-08-03 DIAGNOSIS — N10 Acute tubulo-interstitial nephritis: Secondary | ICD-10-CM | POA: Diagnosis present

## 2017-08-03 DIAGNOSIS — D649 Anemia, unspecified: Secondary | ICD-10-CM | POA: Diagnosis present

## 2017-08-03 DIAGNOSIS — Z87440 Personal history of urinary (tract) infections: Secondary | ICD-10-CM

## 2017-08-03 DIAGNOSIS — N12 Tubulo-interstitial nephritis, not specified as acute or chronic: Secondary | ICD-10-CM | POA: Diagnosis not present

## 2017-08-03 DIAGNOSIS — F17210 Nicotine dependence, cigarettes, uncomplicated: Secondary | ICD-10-CM | POA: Diagnosis present

## 2017-08-03 DIAGNOSIS — D509 Iron deficiency anemia, unspecified: Secondary | ICD-10-CM | POA: Diagnosis present

## 2017-08-03 DIAGNOSIS — E872 Acidosis: Secondary | ICD-10-CM | POA: Diagnosis present

## 2017-08-03 DIAGNOSIS — D501 Sideropenic dysphagia: Secondary | ICD-10-CM | POA: Diagnosis not present

## 2017-08-03 DIAGNOSIS — Z888 Allergy status to other drugs, medicaments and biological substances status: Secondary | ICD-10-CM

## 2017-08-03 DIAGNOSIS — N179 Acute kidney failure, unspecified: Secondary | ICD-10-CM | POA: Diagnosis present

## 2017-08-03 DIAGNOSIS — E876 Hypokalemia: Secondary | ICD-10-CM

## 2017-08-03 DIAGNOSIS — R7881 Bacteremia: Secondary | ICD-10-CM | POA: Diagnosis not present

## 2017-08-03 DIAGNOSIS — N151 Renal and perinephric abscess: Secondary | ICD-10-CM

## 2017-08-03 HISTORY — DX: Bacteremia: R78.81

## 2017-08-03 HISTORY — DX: Tubulo-interstitial nephritis, not specified as acute or chronic: N12

## 2017-08-03 LAB — COMPREHENSIVE METABOLIC PANEL
ALT: 28 U/L (ref 14–54)
AST: 20 U/L (ref 15–41)
Albumin: 3.1 g/dL — ABNORMAL LOW (ref 3.5–5.0)
Alkaline Phosphatase: 111 U/L (ref 38–126)
Anion gap: 11 (ref 5–15)
BUN: 9 mg/dL (ref 6–20)
CO2: 18 mmol/L — ABNORMAL LOW (ref 22–32)
Calcium: 8.6 mg/dL — ABNORMAL LOW (ref 8.9–10.3)
Chloride: 109 mmol/L (ref 101–111)
Creatinine, Ser: 0.89 mg/dL (ref 0.44–1.00)
GFR calc Af Amer: >60 mL/min (ref >60)
GFR calc non Af Amer: >60 mL/min (ref >60)
Glucose, Bld: 125 mg/dL — ABNORMAL HIGH (ref 65–99)
Potassium: 2.9 mmol/L — ABNORMAL LOW (ref 3.5–5.1)
Sodium: 138 mmol/L (ref 135–145)
Total Bilirubin: 0.6 mg/dL (ref 0.3–1.2)
Total Protein: 7 g/dL (ref 6.5–8.1)

## 2017-08-03 LAB — BLOOD CULTURE ID PANEL (REFLEXED)

## 2017-08-03 LAB — I-STAT CG4 LACTIC ACID, ED: Lactic Acid, Venous: 1.49 mmol/L (ref 0.5–1.9)

## 2017-08-03 LAB — CBC WITH DIFFERENTIAL/PLATELET
Basophils Absolute: 0 10*3/uL (ref 0.0–0.1)
Basophils Relative: 0 %
Eosinophils Absolute: 0 10*3/uL (ref 0.0–0.7)
Eosinophils Relative: 0 %
HCT: 30.8 % — ABNORMAL LOW (ref 36.0–46.0)
Hemoglobin: 10.7 g/dL — ABNORMAL LOW (ref 12.0–15.0)
Lymphocytes Relative: 5 %
Lymphs Abs: 1 10*3/uL (ref 0.7–4.0)
MCH: 31.9 pg (ref 26.0–34.0)
MCHC: 34.7 g/dL (ref 30.0–36.0)
MCV: 91.9 fL (ref 78.0–100.0)
Monocytes Absolute: 0.6 10*3/uL (ref 0.1–1.0)
Monocytes Relative: 3 %
Neutro Abs: 18.5 10*3/uL — ABNORMAL HIGH (ref 1.7–7.7)
Neutrophils Relative %: 92 %
Platelets: 345 10*3/uL (ref 150–400)
RBC: 3.35 MIL/uL — ABNORMAL LOW (ref 3.87–5.11)
RDW: 14.3 % (ref 11.5–15.5)
WBC: 20.1 10*3/uL — ABNORMAL HIGH (ref 4.0–10.5)

## 2017-08-03 LAB — IRON AND TIBC
Iron: 10 ug/dL — ABNORMAL LOW (ref 28–170)
Saturation Ratios: 5 % — ABNORMAL LOW (ref 10.4–31.8)
TIBC: 185 ug/dL — ABNORMAL LOW (ref 250–450)
UIBC: 175 ug/dL

## 2017-08-03 LAB — FERRITIN: Ferritin: 76 ng/mL (ref 11–307)

## 2017-08-03 LAB — HCG, SERUM, QUALITATIVE: Preg, Serum: NEGATIVE

## 2017-08-03 LAB — RETICULOCYTES
RBC.: 2.73 MIL/uL — ABNORMAL LOW (ref 3.87–5.11)
Retic Count, Absolute: 10.9 10*3/uL — ABNORMAL LOW (ref 19.0–186.0)
Retic Ct Pct: 0.4 % (ref 0.4–3.1)

## 2017-08-03 LAB — FOLATE: Folate: 14.3 ng/mL (ref >5.9)

## 2017-08-03 LAB — VITAMIN B12: Vitamin B-12: 1939 pg/mL — ABNORMAL HIGH (ref 180–914)

## 2017-08-03 LAB — I-STAT BETA HCG BLOOD, ED (MC, WL, AP ONLY): I-stat hCG, quantitative: 29.5 m[IU]/mL — ABNORMAL HIGH (ref <5)

## 2017-08-03 LAB — OCCULT BLOOD X 1 CARD TO LAB, STOOL: Fecal Occult Bld: NEGATIVE

## 2017-08-03 LAB — MAGNESIUM: Magnesium: 2.2 mg/dL (ref 1.7–2.4)

## 2017-08-03 MED ORDER — OXYCODONE-ACETAMINOPHEN 5-325 MG PO TABS: 1.0000 | ORAL_TABLET | Freq: Four times a day (QID) | ORAL | Status: DC | PRN

## 2017-08-03 MED ORDER — CLONIDINE HCL 0.2 MG PO TABS
0.2000 mg | ORAL_TABLET | Freq: Two times a day (BID) | ORAL | Status: DC
  Administered 2017-08-03 – 2017-08-08 (×10): 0.2 mg via ORAL
  Filled 2017-08-03 (×10): qty 1

## 2017-08-03 MED ORDER — DEXTROSE 5 % IV SOLN
1.0000 g | Freq: Once | INTRAVENOUS | Status: AC
  Administered 2017-08-03: 1 g via INTRAVENOUS
  Filled 2017-08-03: qty 10

## 2017-08-03 MED ORDER — SODIUM CHLORIDE 0.9 % IV BOLUS (SEPSIS)
1000.0000 mL | Freq: Once | INTRAVENOUS | Status: AC
  Administered 2017-08-03: 1000 mL via INTRAVENOUS

## 2017-08-03 MED ORDER — MORPHINE SULFATE (PF) 4 MG/ML IV SOLN
1.0000 mg | INTRAVENOUS | Status: DC | PRN
  Administered 2017-08-03 – 2017-08-08 (×11): 1 mg via INTRAVENOUS
  Filled 2017-08-03 (×11): qty 1

## 2017-08-03 MED ORDER — ONDANSETRON HCL 4 MG/2ML IJ SOLN
4.0000 mg | Freq: Once | INTRAMUSCULAR | Status: AC
  Administered 2017-08-03: 4 mg via INTRAVENOUS
  Filled 2017-08-03: qty 2

## 2017-08-03 MED ORDER — SODIUM CHLORIDE 0.9 % IV SOLN
INTRAVENOUS | Status: DC
  Administered 2017-08-03 – 2017-08-04 (×2): via INTRAVENOUS

## 2017-08-03 MED ORDER — POLYETHYLENE GLYCOL 3350 17 GM/SCOOP PO POWD: 0.5000 | Freq: Every day | ORAL | Status: DC | PRN

## 2017-08-03 MED ORDER — HYDROCODONE-ACETAMINOPHEN 5-325 MG PO TABS
1.0000 | ORAL_TABLET | ORAL | Status: DC | PRN
  Administered 2017-08-03 – 2017-08-08 (×16): 2 via ORAL
  Administered 2017-08-08: 1 via ORAL
  Administered 2017-08-08 (×2): 2 via ORAL
  Filled 2017-08-03 (×20): qty 2

## 2017-08-03 MED ORDER — POTASSIUM CHLORIDE CRYS ER 20 MEQ PO TBCR
40.0000 meq | EXTENDED_RELEASE_TABLET | Freq: Once | ORAL | Status: AC
  Administered 2017-08-03: 40 meq via ORAL
  Filled 2017-08-03: qty 2

## 2017-08-03 MED ORDER — ONDANSETRON HCL 4 MG PO TABS: 4.0000 mg | ORAL_TABLET | Freq: Four times a day (QID) | ORAL | Status: DC | PRN

## 2017-08-03 MED ORDER — ONDANSETRON HCL 4 MG/2ML IJ SOLN
4.0000 mg | Freq: Four times a day (QID) | INTRAMUSCULAR | Status: DC | PRN
  Administered 2017-08-04 – 2017-08-07 (×4): 4 mg via INTRAVENOUS
  Filled 2017-08-03 (×5): qty 2

## 2017-08-03 MED ORDER — ONDANSETRON HCL 4 MG PO TABS: 4.0000 mg | ORAL_TABLET | Freq: Four times a day (QID) | ORAL | 0 refills | Status: DC | PRN

## 2017-08-03 MED ORDER — SODIUM CHLORIDE 0.9 % IV BOLUS (SEPSIS)
500.0000 mL | Freq: Once | INTRAVENOUS | Status: AC
Start: 2017-08-03 — End: 2017-08-03
  Administered 2017-08-03: 500 mL via INTRAVENOUS

## 2017-08-03 MED ORDER — CEFTRIAXONE SODIUM 2 G IJ SOLR
2.0000 g | INTRAMUSCULAR | Status: DC
  Administered 2017-08-04: 2 g via INTRAVENOUS
  Filled 2017-08-03: qty 2

## 2017-08-03 MED ORDER — MORPHINE SULFATE (PF) 4 MG/ML IV SOLN
4.0000 mg | Freq: Once | INTRAVENOUS | Status: AC
  Administered 2017-08-03: 4 mg via INTRAVENOUS
  Filled 2017-08-03: qty 1

## 2017-08-03 MED ORDER — POLYETHYLENE GLYCOL 3350 17 G PO PACK: 17.0000 g | PACK | Freq: Every day | ORAL | Status: DC | PRN

## 2017-08-03 MED ORDER — DIPHENHYDRAMINE HCL 25 MG PO CAPS
25.0000 mg | ORAL_CAPSULE | Freq: Four times a day (QID) | ORAL | Status: DC | PRN
Start: 2017-08-03 — End: 2017-08-08

## 2017-08-03 MED ORDER — POTASSIUM CHLORIDE CRYS ER 20 MEQ PO TBCR: 40.0000 meq | EXTENDED_RELEASE_TABLET | Freq: Two times a day (BID) | ORAL | 0 refills | Status: DC

## 2017-08-03 MED ORDER — HYDRALAZINE HCL 20 MG/ML IJ SOLN
5.0000 mg | INTRAMUSCULAR | Status: DC | PRN
  Administered 2017-08-07: 5 mg via INTRAVENOUS
  Filled 2017-08-03: qty 1

## 2017-08-03 MED ORDER — ALBUTEROL SULFATE HFA 108 (90 BASE) MCG/ACT IN AERS: 2.0000 | INHALATION_SPRAY | Freq: Four times a day (QID) | RESPIRATORY_TRACT | Status: DC | PRN

## 2017-08-03 MED ORDER — TRAZODONE HCL 50 MG PO TABS
25.0000 mg | ORAL_TABLET | Freq: Every evening | ORAL | Status: DC | PRN
  Administered 2017-08-04 – 2017-08-07 (×2): 25 mg via ORAL
  Filled 2017-08-03 (×2): qty 1

## 2017-08-03 MED ORDER — ALBUTEROL SULFATE (2.5 MG/3ML) 0.083% IN NEBU: 2.5000 mg | INHALATION_SOLUTION | Freq: Four times a day (QID) | RESPIRATORY_TRACT | Status: DC | PRN

## 2017-08-03 MED ORDER — POTASSIUM CHLORIDE 10 MEQ/100ML IV SOLN: 10.0000 meq | INTRAVENOUS | Status: DC

## 2017-08-03 MED ORDER — CEFTRIAXONE SODIUM 1 G IJ SOLR
1.0000 g | Freq: Once | INTRAMUSCULAR | Status: AC
  Administered 2017-08-03: 1 g via INTRAVENOUS
  Filled 2017-08-03: qty 10

## 2017-08-03 MED ORDER — ACETAMINOPHEN 500 MG PO TABS
1000.0000 mg | ORAL_TABLET | Freq: Four times a day (QID) | ORAL | Status: DC | PRN
  Administered 2017-08-03: 1000 mg via ORAL
  Filled 2017-08-03: qty 2

## 2017-08-03 MED ORDER — CLONIDINE HCL 0.2 MG PO TABS: 0.2000 mg | ORAL_TABLET | Freq: Two times a day (BID) | ORAL | Status: DC

## 2017-08-03 MED ORDER — ENOXAPARIN SODIUM 40 MG/0.4ML ~~LOC~~ SOLN
40.0000 mg | Freq: Every day | SUBCUTANEOUS | Status: DC
  Administered 2017-08-04: 40 mg via SUBCUTANEOUS
  Filled 2017-08-03: qty 0.4

## 2017-08-03 NOTE — ED Notes (Signed)
Admitting provider at bedside.

## 2017-08-03 NOTE — H&P (Signed)
History and Physical    Shirley AlleyLinda A [name] QVZ:[id number] DOB: 19-May-1976 DOA: 08/03/2017  PCP: [facility] Patient coming from: home  Chief Complaint: n/v/flank pain fever  HPI: Shirley AlleyLinda A [name] is a 41 y.o. female with medical history significant of hypertension, recurrent urinary tract infections, GERD, anemia presents to the emergency Department chief complaint persistent nausea vomiting flank pain. Initial evaluation reveals fever leukocytosis urinalysis from yesterday consistent with UTI and positive blood cultures from yesterday. [facility] asked to admit  She was in the ED yesterday and diagnosed with pyelonephritis. He is given IV fluids and antibiotics and symptoms improved. She was discharged with prescription for medications which she has not had a chance to fill. Reports once the medication she received while here were off nausea vomiting fever returned. Associated symptoms include flank pain decreased oral intake one episode of diarrhea. She denies headache dizziness syncope or near-syncope. She denies chest pain palpitation shortness of breath lower extremity edema or orthopnea. She does endorse some dysuria off and on for the last month. She also reports recurrent urinary tract infections.    ED Course: In emergency department she's given IV fluids Rocephin Tylenol morphine and Zofran. She max temp 99.6 she's tachycardia with tachypnea she is not hypoxic. She is somewhat ill appearing  Review of Systems: As per HPI otherwise all other systems reviewed and are negative.   Ambulatory Status: Ambulates independently is independent with ADLs lives at home with her husband and 2 daughters  Past Medical History:  Diagnosis Date  . Anemia   . Bacteremia   . Blood dyscrasia    has to apply a lot of pressure to stop bleeding  . GERD (gastroesophageal reflux disease)   . Heart murmur    with pregnancy  . History of hiatal hernia   . Hypertension    4-5 years ago, no meds  . Kidney infection    patient states I might have a kidney infection  . Migraine    migraines  . Pyelonephritis     Past Surgical History:  Procedure Laterality Date  . ABDOMINAL HERNIA REPAIR    . CESAREAN SECTION     x 4  . CESAREAN SECTION     x 4  . ENDOMETRIAL ABLATION  07/31/2016  . HYSTEROSCOPY  07/31/2016   Procedure: HYSTEROSCOPY WITH HYDROTHERMAL ABLATION;  Surgeon: Kimmswick Bingharlie Pickens, MD;  Location: [facility];  Service: Gynecology;;    Social History   Socioeconomic History  . Marital status: Married    Spouse name: Not on file  . Number of children: Not on file  . Years of education: Not on file  . Highest education level: Not on file  Social Needs  . Financial resource strain: Not on file  . Food insecurity - worry: Not on file  . Food insecurity - inability: Not on file  . Transportation needs - medical: Not on file  . Transportation needs - non-medical: Not on file  Occupational History  . Not on file  Tobacco Use  . Smoking status: Current Every Day Smoker    Packs/day: 0.50    Types: Cigarettes  . Smokeless tobacco: Never Used  Substance and Sexual Activity  . Alcohol use: No    Comment: occasionally  . Drug use: No  . Sexual activity: Yes    Birth control/protection: None  Other Topics Concern  . Not on file  Social History Narrative  . Not on file    Allergies  Allergen  Reactions  . Lisinopril Anaphylaxis    Feet, hands, and throat became swollen  . Aspirin Nausea Only    Makes stomach burn  . Ibuprofen Nausea Only    Makes stomach burn    Family History  Problem Relation Age of Onset  . Hypertension Mother   . Colon cancer Father 87  . Stroke Brother     Prior to Admission medications   Medication Sig Start Date End Date Taking? Authorizing Provider  acetaminophen (TYLENOL) 500 MG tablet Take 1,000 mg by mouth every 6 (six) hours as needed for moderate pain or headache.   Yes [provider]    albuterol (PROVENTIL HFA;VENTOLIN HFA) 108 (90 Base) MCG/ACT inhaler Inhale 2 puffs into the lungs every 6 (six) hours as needed for wheezing or shortness of breath.   Yes [provider]  cloNIDine (CATAPRES) 0.2 MG tablet Take 0.2 mg by mouth 2 (two) times daily.    Yes [provider]  ondansetron (ZOFRAN-ODT) 8 MG disintegrating tablet Take 8 mg by mouth every 8 (eight) hours as needed for nausea or vomiting.   Yes [provider]  polyethylene glycol powder (MIRALAX) powder 1 capful daily until normal bowel movements resume Patient taking differently: Take by mouth daily as needed for mild constipation.  08/06/16  Yes Marny Lowenstein, PA-C  diphenhydrAMINE (BENADRYL) 25 MG tablet Take 25 mg by mouth every 6 (six) hours as needed for allergies.    [provider]  ondansetron (ZOFRAN) 4 MG tablet Take 1 tablet (4 mg total) by mouth every 6 (six) hours as needed for nausea or vomiting. 08/03/17   Lavera Guise, MD  oxyCODONE-acetaminophen (PERCOCET) 5-325 MG tablet Take 1 tablet by mouth every 6 (six) hours as needed. Patient taking differently: Take 1 tablet by mouth every 6 (six) hours as needed for moderate pain.  04/02/17   Bethann Berkshire, MD  potassium chloride SA (K-DUR,KLOR-CON) 20 MEQ tablet Take 2 tablets (40 mEq total) by mouth 2 (two) times daily. 08/03/17   Lavera Guise, MD    Physical Exam: Vitals:   08/03/17 1300 08/03/17 1600 08/03/17 1615 08/03/17 1630  BP: (!) 149/92 (!) 158/104 (!) 158/103 (!) 148/94  Pulse: 100 (!) 111 (!) 108 (!) 106  Resp: 17     Temp:      TempSrc:      SpO2: 100% 100% 100% 100%     General:  Appears somewhat anxious and ill appearing in no acute distress Eyes:  PERRL, EOMI, normal lids, iris ENT:  grossly normal hearing, lips & tongue, mucous membranes of her mouth are pink but dry Neck:  no LAD, masses or thyromegaly Cardiovascular:  Tachycardic but regular, no m/r/g. No LE edema.  Respiratory:  Tachypnea  effort normal good air movement hear no wheezes no crackles Abdomen:  soft, ntnd, positive bowel sounds. Skin:  no rash or induration seen on limited exam Musculoskeletal:  grossly normal tone BUE/BLE, good ROM, no bony abnormality. Mild to moderate tenderness bilateral flank Psychiatric:  grossly normal mood and affect, speech fluent and appropriate, AOx3 Neurologic:  CN 2-12 grossly intact, moves all extremities in coordinated fashion, sensation intact. Space clear facial symmetry  Labs on Admission: I have personally reviewed following labs and imaging studies  CBC: Recent Labs  Lab 08/02/17 1703 08/03/17 1109  WBC 19.7* 20.1*  NEUTROABS 17.9* 18.5*  HGB 11.5* 10.7*  HCT 33.5* 30.8*  MCV 93.1 91.9  PLT 326 345   Basic Metabolic Panel:  Recent Labs  Lab 08/02/17 1703 08/03/17 1109 08/03/17 1158  NA 134* 138  --   K 3.4* 2.9*  --   CL 104 109  --   CO2 18* 18*  --   GLUCOSE 104* 125*  --   BUN 9 9  --   CREATININE 1.01* 0.89  --   CALCIUM 8.8* 8.6*  --   MG  --   --  2.2   GFR: Estimated Creatinine Clearance: 66.5 mL/min (by C-G formula based on SCr of 0.89 mg/dL). Liver Function Tests: Recent Labs  Lab 08/02/17 1703 08/03/17 1109  AST 28 20  ALT 34 28  ALKPHOS 115 111  BILITOT 1.0 0.6  PROT 7.4 7.0  ALBUMIN 3.4* 3.1*   Recent Labs  Lab 08/02/17 1740  LIPASE 24   No results for input(s): AMMONIA in the last 168 hours. Coagulation Profile: Recent Labs  Lab 08/02/17 1703  INR 1.16   Cardiac Enzymes: No results for input(s): CKTOTAL, CKMB, CKMBINDEX, TROPONINI in the last 168 hours. BNP (last 3 results) No results for input(s): PROBNP in the last 8760 hours. HbA1C: No results for input(s): HGBA1C in the last 72 hours. CBG: No results for input(s): GLUCAP in the last 168 hours. Lipid Profile: No results for input(s): CHOL, HDL, LDLCALC, TRIG, CHOLHDL, LDLDIRECT in the last 72 hours. Thyroid Function Tests: No results for input(s): TSH, T4TOTAL,  FREET4, T3FREE, THYROIDAB in the last 72 hours. Anemia Panel: No results for input(s): VITAMINB12, FOLATE, FERRITIN, TIBC, IRON, RETICCTPCT in the last 72 hours. Urine analysis:    Component Value Date/Time   COLORURINE YELLOW 08/02/2017 1801   APPEARANCEUR HAZY (A) 08/02/2017 1801   LABSPEC 1.016 08/02/2017 1801   PHURINE 6.0 08/02/2017 1801   GLUCOSEU NEGATIVE 08/02/2017 1801   HGBUR MODERATE (A) 08/02/2017 1801   BILIRUBINUR NEGATIVE 08/02/2017 1801   KETONESUR 5 (A) 08/02/2017 1801   PROTEINUR 100 (A) 08/02/2017 1801   UROBILINOGEN 0.2 02/09/2015 1233   NITRITE NEGATIVE 08/02/2017 1801   LEUKOCYTESUR MODERATE (A) 08/02/2017 1801    Creatinine Clearance: Estimated Creatinine Clearance: 66.5 mL/min (by C-G formula based on SCr of 0.89 mg/dL).  Sepsis Labs: @LABRCNTIP (procalcitonin:4,lacticidven:4) ) Recent Results (from the past 240 hour(s))  Culture, blood (Routine x 2)     Status: None (Preliminary result)   Collection Time: 08/02/17  5:03 PM  Result Value Ref Range Status   Specimen Description BLOOD RIGHT ANTECUBITAL  Final   Special Requests   Final    BOTTLES DRAWN AEROBIC AND ANAEROBIC Blood Culture adequate volume   Culture  Setup Time   Final    GRAM NEGATIVE RODS IN BOTH AEROBIC AND ANAEROBIC BOTTLES CRITICAL RESULT CALLED TO, READ BACK BY AND VERIFIED WITH: S GOUGE,RN AT 1610 08/03/17 BY L BENFIELD    Culture GRAM NEGATIVE RODS  Final   Report Status PENDING  Incomplete  Blood Culture ID Panel (Reflexed)     Status: Abnormal   Collection Time: 08/02/17  5:03 PM  Result Value Ref Range Status   Enterococcus species NOT DETECTED NOT DETECTED Final   Listeria monocytogenes NOT DETECTED NOT DETECTED Final   Staphylococcus species NOT DETECTED NOT DETECTED Final   Staphylococcus aureus NOT DETECTED NOT DETECTED Final   Streptococcus species NOT DETECTED NOT DETECTED Final   Streptococcus agalactiae NOT DETECTED NOT DETECTED Final   Streptococcus pneumoniae NOT  DETECTED NOT DETECTED Final   Streptococcus pyogenes NOT DETECTED NOT DETECTED Final   Acinetobacter baumannii NOT DETECTED NOT DETECTED  Final   Enterobacteriaceae species DETECTED (A) NOT DETECTED Final    Comment: Enterobacteriaceae represent a large family of gram-negative bacteria, not a single organism. CRITICAL RESULT CALLED TO, READ BACK BY AND VERIFIED WITH: S GOUGE,RN AT [id number] 08/03/17 BY L BENFIELD    Enterobacter cloacae complex NOT DETECTED NOT DETECTED Final   Escherichia coli DETECTED (A) NOT DETECTED Final    Comment: CRITICAL RESULT CALLED TO, READ BACK BY AND VERIFIED WITH: S GOUGE,RN AT [id number] 08/03/17 BY L BENFIELD    Klebsiella oxytoca NOT DETECTED NOT DETECTED Final   Klebsiella pneumoniae NOT DETECTED NOT DETECTED Final   Proteus species NOT DETECTED NOT DETECTED Final   Serratia marcescens NOT DETECTED NOT DETECTED Final   Carbapenem resistance NOT DETECTED NOT DETECTED Final   Haemophilus influenzae NOT DETECTED NOT DETECTED Final   Neisseria meningitidis NOT DETECTED NOT DETECTED Final   Pseudomonas aeruginosa NOT DETECTED NOT DETECTED Final   Candida albicans NOT DETECTED NOT DETECTED Final   Candida glabrata NOT DETECTED NOT DETECTED Final   Candida krusei NOT DETECTED NOT DETECTED Final   Candida parapsilosis NOT DETECTED NOT DETECTED Final   Candida tropicalis NOT DETECTED NOT DETECTED Final  Culture, blood (Routine x 2)     Status: None (Preliminary result)   Collection Time: 08/02/17  5:46 PM  Result Value Ref Range Status   Specimen Description BLOOD RIGHT ANTECUBITAL  Final   Special Requests   Final    BOTTLES DRAWN AEROBIC AND ANAEROBIC Blood Culture adequate volume   Culture  Setup Time   Final    GRAM NEGATIVE RODS AEROBIC BOTTLE ONLY CRITICAL RESULT CALLED TO, READ BACK BY AND VERIFIED WITH: S GOUGE,RN AT 1004 08/03/17 BY L BENFIELD    Culture GRAM NEGATIVE RODS  Final   Report Status PENDING  Incomplete     Radiological Exams on  Admission: Dg Chest 2 View  Result Date: 08/02/2017 CLINICAL DATA:  Chest pain, fever, chills. EXAM: CHEST  2 VIEW COMPARISON:  Radiograph of May 09, 2017. FINDINGS: The heart size and mediastinal contours are within normal limits. Both lungs are clear. No pneumothorax or pleural effusion is noted. The visualized skeletal structures are unremarkable. IMPRESSION: No active cardiopulmonary disease. Electronically Signed   By: Lupita RaiderJames  Green Jr, M.D.   On: 08/02/2017 17:23   Ct Head Wo Contrast  Result Date: 08/02/2017 CLINICAL DATA:  Lightheadedness and headache for 3 days. EXAM: CT HEAD WITHOUT CONTRAST TECHNIQUE: Contiguous axial images were obtained from the base of the skull through the vertex without intravenous contrast. COMPARISON:  Head CT scan 05/09/2017 02/12/2007. FINDINGS: Brain: Appears normal without hemorrhage, infarct, mass lesion, mass effect, midline shift or abnormal extra-axial fluid collection. No hydrocephalus or pneumocephalus. Vascular: Negative. Skull: Intact. Sinuses/Orbits: Negative. Other: None. IMPRESSION: Normal head CT. Electronically Signed   By: Drusilla Kannerhomas  Dalessio M.D.   On: 08/02/2017 19:08   Ct Abdomen Pelvis W Contrast  Result Date: 08/02/2017 CLINICAL DATA:  Lower abdominal pain beginning 2 days ago, greater in the right lower quadrant today. EXAM: CT ABDOMEN AND PELVIS WITH CONTRAST TECHNIQUE: Multidetector CT imaging of the abdomen and pelvis was performed using the standard protocol following bolus administration of intravenous contrast. CONTRAST:  100mL ISOVUE-300 IOPAMIDOL (ISOVUE-300) INJECTION 61% COMPARISON:  04/02/2017 FINDINGS: Lower chest: Minimal dependent atelectasis in the lung bases. No pleural effusion. Hepatobiliary: No focal liver abnormality is seen. The gallbladder is moderately distended with possible mild wall thickening. No calcified gallstones are identified, although there may be subtly  hyperattenuating material dependently in the gallbladder  which could reflect sludge. No biliary dilatation. Pancreas: Unremarkable. Spleen: Unremarkable. Adrenals/Urinary Tract: Unremarkable adrenal glands. There is a 1.6 cm well-defined low density focus laterally in the interpolar left kidney which is new. Additional patchy areas of more hypoenhancement are present in both kidneys, with the most extensive involvement being in the lower pole on the right. There is mild perinephric stranding inferiorly on the right. There is no hydronephrosis. The bladder is unremarkable. Stomach/Bowel: The stomach is within normal limits. There is no evidence of bowel obstruction. The appendix is not confidently identified, however no significant inflammatory changes are seen about the cecum to strongly suggest acute appendicitis. Vascular/Lymphatic: Minimal atherosclerotic calcification of the distal abdominal aorta. No enlarged lymph nodes. Reproductive: Unremarkable uterus and right adnexa. Suspected left ovarian corpus luteum. Other: Small volume pelvic free fluid. Unchanged 14 mm hypodense focus in the upper anterior abdominal wall which may reflect fluid in a small ventral hernia. Musculoskeletal: No acute osseous abnormality or suspicious osseous lesion. IMPRESSION: 1. Patchy hypoenhancement in both kidneys consistent with pyelonephritis. Possible developing 1.6 cm abscess in the left kidney. 2. Distended gallbladder with possible mild wall thickening. Right upper quadrant ultrasound could be performed further evaluation as clinically warranted. No calcified gallstones or biliary dilatation. Electronically Signed   By: Sebastian Ache M.D.   On: 08/02/2017 19:26    EKG:   Assessment/Plan Principal Problem:   Bacteremia Active Problems:   Essential hypertension   Anemia   Pyelonephritis   Hypokalemia   Tobacco use   #1.Bacteremia. Gram negative rods x2 yesterday. Likely from peylonephritis. Denies IV drug use.  She is febrile with a leukocytosis tachycardia intermittent  tachypnea. Lactic acid is within the limits of normal is hemodynamically stable and not hypoxic. He is provided with IV fluids and Rocephin. -Admit -Repeat blood cultures -Rocephin per pharmacy -Follow blood cultures  #2. Pyelonephritis. CT of the abdomen with patchy hypoenhancement in both kidneys consistent with pyelonephritis. Possible developing 1.6 cm abscess in the left kidney. Urinalysis yesterday consistent with UTI. -Follow urine culture obtained yesterday -Rocephin per pharmacy -Supportive therapies -Monitor urine output -IV fluids  #3. Hypokalemia. Likely related to decreased oral intake. Potassium level II.9. She was given 40 mEq of potassium in the emergency department 2. Magnesium level within the limits of normal. -IV fluids -Replete -Recheck in a.m.  #4. Hypertension. Poor control in the emergency department Home medications include clonidine. She states that one time she was also taking lisinopril but "this made me feel funny".  -Monitor -Resume home clonidine -When necessary hydralazine  #5. Anemia. Hg 10.7 on admission. Chart review indicates hg 13 4 months ago. No s/sx active bleeding.  -fobt -anemia panel -monitor  #6. Tobacco use. -Cessation counseling offered  DVT prophylaxis: lovenox  Code Status: full  Family Communication: husband and daughters  Disposition Plan: home  Consults called: none  Admission status: obs    Toya Smothers M MD [facility]  If 7PM-7AM, please contact night-coverage [email or web address] Password [facility]  08/03/2017, 4:54 PM

## 2017-08-03 NOTE — ED Provider Notes (Addendum)
[facility] EMERGENCY DEPARTMENT Provider Note   CSN: [id number] Arrival date & time: 08/03/17  1027     History   Chief Complaint No chief complaint on file.   HPI Shirley [name] is a 41 y.o. female.  HPI 41 year old female who presents with nausea, vomiting, back pain.  She was seen in the ED one day ago for the same and diagnosed with pyelonephritis.  States that she was feeling improved after fluids and treatment in the ED.  States that she was discharged late in the night, did not fill any of her medications.  Once the medications wore off she developed recurrent nausea and vomiting, and came to ED for evaluation.  Still complains of subjective fevers and chills and diarrhea with bilateral flank pain and generalized abdominal pain.  No chest pain, difficulty breathing, cough, sputum production. Past Medical History:  Diagnosis Date  . Anemia   . Blood dyscrasia    has to apply a lot of pressure to stop bleeding  . GERD (gastroesophageal reflux disease)   . Heart murmur    with pregnancy  . History of hiatal hernia   . Hypertension    4-5 years ago, no meds  . Kidney infection    patient states I might have a kidney infection  . Migraine    migraines    Patient Active Problem List   Diagnosis Date Noted  . Acute postoperative pain 07/31/2016  . Menometrorrhagia 04/09/2016  . Absolute anemia 04/09/2016  . Abnormal uterine bleeding (AUB) 04/09/2016  . Essential hypertension 11/17/2009    Past Surgical History:  Procedure Laterality Date  . ABDOMINAL HERNIA REPAIR    . CESAREAN SECTION     x 4  . CESAREAN SECTION     x 4  . ENDOMETRIAL ABLATION  07/31/2016  . HYSTEROSCOPY  07/31/2016   Procedure: HYSTEROSCOPY WITH HYDROTHERMAL ABLATION;  Surgeon: Somers Bing, MD;  Location: [facility];  Service: Gynecology;;    OB History    Gravida Para Term Preterm AB Living   5 4 3 1 1 4    SAB TAB Ectopic Multiple Live Births                  Obstetric Comments   c--section x 4. 36wk c-section x 1       Home Medications    Prior to Admission medications   Medication Sig Start Date End Date Taking? Authorizing Provider  acetaminophen (TYLENOL) 500 MG tablet Take 1,000 mg by mouth every 6 (six) hours as needed for moderate pain or headache.   Yes [provider]  albuterol (PROVENTIL HFA;VENTOLIN HFA) 108 (90 Base) MCG/ACT inhaler Inhale 2 puffs into the lungs every 6 (six) hours as needed for wheezing or shortness of breath.   Yes [provider]  cloNIDine (CATAPRES) 0.2 MG tablet Take 0.2 mg by mouth 2 (two) times daily.    Yes [provider]  ondansetron (ZOFRAN-ODT) 8 MG disintegrating tablet Take 8 mg by mouth every 8 (eight) hours as needed for nausea or vomiting.   Yes [provider]  polyethylene glycol powder (MIRALAX) powder 1 capful daily until normal bowel movements resume Patient taking differently: Take by mouth daily as needed for mild constipation.  08/06/16  Yes Marny Lowenstein, PA-C  benzonatate (TESSALON) 100 MG capsule Take 1 capsule (100 mg total) by mouth every 8 (eight) hours. Patient not taking: Reported on 04/02/2017 10/29/16   Jaynie Crumble, PA-C  diphenhydrAMINE (BENADRYL)  25 MG tablet Take 25 mg by mouth every 6 (six) hours as needed for allergies.    [provider]  HYDROcodone-acetaminophen (NORCO/VICODIN) 5-325 MG tablet Take 1-2 tablets by mouth every 6 (six) hours as needed. Patient not taking: Reported on 08/03/2017 08/02/17   Maczis, Elmer SowMichael M, PA-C  HYDROmorphone (DILAUDID) 2 MG tablet Take 1 tablet (2 mg total) by mouth every 4 (four) hours as needed for severe pain. Patient not taking: Reported on 08/30/2016 08/01/16   Anyanwu, Jethro BastosUgonna A, MD  lisinopril (PRINIVIL,ZESTRIL) 20 MG tablet TAKE 1 TABLET (20 MG TOTAL) BY MOUTH DAILY. Patient not taking: Reported on 04/02/2017 01/14/17   Tereso NewcomerAnyanwu, Ugonna A, MD  omeprazole (PRILOSEC) 20 MG capsule Take 1  capsule (20 mg total) by mouth daily. Patient not taking: Reported on 08/03/2017 10/17/15   Barrett, Rolm GalaStevi, PA-C  ondansetron (ZOFRAN) 4 MG tablet Take 1 tablet (4 mg total) by mouth every 6 (six) hours. Patient not taking: Reported on 05/09/2017 04/02/17   Bethann BerkshireZammit, Joseph, MD  ondansetron (ZOFRAN) 4 MG tablet Take 1 tablet (4 mg total) by mouth every 6 (six) hours as needed for nausea or vomiting. 08/03/17   Lavera GuiseLiu, Kenny Stern Duo, MD  oxyCODONE-acetaminophen (PERCOCET) 5-325 MG tablet Take 1 tablet by mouth every 6 (six) hours as needed. Patient taking differently: Take 1 tablet by mouth every 6 (six) hours as needed for moderate pain.  04/02/17   Bethann BerkshireZammit, Joseph, MD  phenazopyridine (PYRIDIUM) 200 MG tablet Take 1 tablet (200 mg total) by mouth 3 (three) times daily. Patient not taking: Reported on 08/03/2017 08/02/17   Maczis, Elmer SowMichael M, PA-C  potassium chloride SA (K-DUR,KLOR-CON) 20 MEQ tablet Take 2 tablets (40 mEq total) by mouth 2 (two) times daily. 08/03/17   Lavera GuiseLiu, Aften Lipsey Duo, MD  sulfamethoxazole-trimethoprim (BACTRIM DS,SEPTRA DS) 800-160 MG tablet Take 1 tablet by mouth 2 (two) times daily for 14 days. Patient not taking: Reported on 08/03/2017 08/02/17 08/16/17  Maczis, Elmer SowMichael M, PA-C  traMADol (ULTRAM) 50 MG tablet Take 1 tablet (50 mg total) by mouth every 6 (six) hours as needed for severe pain. Patient not taking: Reported on 08/30/2016 03/05/16   Tereso NewcomerAnyanwu, Ugonna A, MD    Family History Family History  Problem Relation Age of Onset  . Hypertension Mother   . Colon cancer Father [id number]    Social History Social History   Tobacco Use  . Smoking status: Current Every Day Smoker    Packs/day: 0.50    Types: Cigarettes  . Smokeless tobacco: Never Used  Substance Use Topics  . Alcohol use: No    Comment: occasionally  . Drug use: No     Allergies   Lisinopril; Aspirin; and Ibuprofen   Review of Systems Review of Systems  Constitutional: Positive for fatigue and fever.    Gastrointestinal: Positive for abdominal pain, diarrhea, nausea and vomiting.  Genitourinary: Positive for flank pain, frequency and urgency. Negative for dysuria.  All other systems reviewed and are negative.    Physical Exam Updated Vital Signs BP (!) 149/92   Pulse 100   Temp 99.2 F (37.3 C) (Oral)   Resp 17   SpO2 100%   Physical Exam Physical Exam  Nursing note and vitals reviewed. Constitutional: appears uncomfortable, non-toxic, and in no acute distress Head: Normocephalic and atraumatic.  Mouth/Throat: Oropharynx is clear and dry.  Neck: Normal range of motion. Neck supple.  Cardiovascular: Tachycardic rate and regular rhythm.   Pulmonary/Chest: Effort normal and breath sounds normal.  Abdominal: Soft. There  is generalized tenderness. There is no rebound and no guarding. bilateral CVA tenderness. Musculoskeletal: Normal range of motion.  Neurological: Alert, no facial droop, fluent speech, moves all extremities symmetrically Skin: Skin is warm and dry.  Psychiatric: Cooperative   ED Treatments / Results  Labs (all labs ordered are listed, but only abnormal results are displayed) Labs Reviewed  CBC WITH DIFFERENTIAL/PLATELET - Abnormal; Notable for the following components:      Result Value   WBC 20.1 (*)    RBC 3.35 (*)    Hemoglobin 10.7 (*)    HCT 30.8 (*)    Neutro Abs 18.5 (*)    All other components within normal limits  COMPREHENSIVE METABOLIC PANEL - Abnormal; Notable for the following components:   Potassium 2.9 (*)    CO2 18 (*)    Glucose, Bld 125 (*)    Calcium 8.6 (*)    Albumin 3.1 (*)    All other components within normal limits  I-STAT BETA HCG BLOOD, ED ([facility], [facility], [facility] ONLY) - Abnormal; Notable for the following components:   I-stat hCG, quantitative 29.5 (*)    All other components within normal limits  HCG, SERUM, QUALITATIVE  MAGNESIUM    EKG  EKG Interpretation None       Radiology Dg Chest 2 View  Result Date:  08/02/2017 CLINICAL DATA:  Chest pain, fever, chills. EXAM: CHEST  2 VIEW COMPARISON:  Radiograph of May 09, 2017. FINDINGS: The heart size and mediastinal contours are within normal limits. Both lungs are clear. No pneumothorax or pleural effusion is noted. The visualized skeletal structures are unremarkable. IMPRESSION: No active cardiopulmonary disease. Electronically Signed   By: Lupita Raider, M.D.   On: 08/02/2017 17:23   Ct Head Wo Contrast  Result Date: 08/02/2017 CLINICAL DATA:  Lightheadedness and headache for 3 days. EXAM: CT HEAD WITHOUT CONTRAST TECHNIQUE: Contiguous axial images were obtained from the base of the skull through the vertex without intravenous contrast. COMPARISON:  Head CT scan 05/09/2017 02/12/2007. FINDINGS: Brain: Appears normal without hemorrhage, infarct, mass lesion, mass effect, midline shift or abnormal extra-axial fluid collection. No hydrocephalus or pneumocephalus. Vascular: Negative. Skull: Intact. Sinuses/Orbits: Negative. Other: None. IMPRESSION: Normal head CT. Electronically Signed   By: Drusilla Kanner M.D.   On: 08/02/2017 19:08   Ct Abdomen Pelvis W Contrast  Result Date: 08/02/2017 CLINICAL DATA:  Lower abdominal pain beginning 2 days ago, greater in the right lower quadrant today. EXAM: CT ABDOMEN AND PELVIS WITH CONTRAST TECHNIQUE: Multidetector CT imaging of the abdomen and pelvis was performed using the standard protocol following bolus administration of intravenous contrast. CONTRAST:  ISOVUE-300 IOPAMIDOL (ISOVUE-300) INJECTION 61% COMPARISON:  04/02/2017 FINDINGS: Lower chest: Minimal dependent atelectasis in the lung bases. No pleural effusion. Hepatobiliary: No focal liver abnormality is seen. The gallbladder is moderately distended with possible mild wall thickening. No calcified gallstones are identified, although there may be subtly hyperattenuating material dependently in the gallbladder which could reflect sludge. No biliary  dilatation. Pancreas: Unremarkable. Spleen: Unremarkable. Adrenals/Urinary Tract: Unremarkable adrenal glands. There is a 1.6 cm well-defined low density focus laterally in the interpolar left kidney which is new. Additional patchy areas of more hypoenhancement are present in both kidneys, with the most extensive involvement being in the lower pole on the right. There is mild perinephric stranding inferiorly on the right. There is no hydronephrosis. The bladder is unremarkable. Stomach/Bowel: The stomach is within normal limits. There is no evidence of bowel obstruction. The appendix is  not confidently identified, however no significant inflammatory changes are seen about the cecum to strongly suggest acute appendicitis. Vascular/Lymphatic: Minimal atherosclerotic calcification of the distal abdominal aorta. No enlarged lymph nodes. Reproductive: Unremarkable uterus and right adnexa. Suspected left ovarian corpus luteum. Other: Small volume pelvic free fluid. Unchanged 14 mm hypodense focus in the upper anterior abdominal wall which may reflect fluid in a small ventral hernia. Musculoskeletal: No acute osseous abnormality or suspicious osseous lesion. IMPRESSION: 1. Patchy hypoenhancement in both kidneys consistent with pyelonephritis. Possible developing 1.6 cm abscess in the left kidney. 2. Distended gallbladder with possible mild wall thickening. Right upper quadrant ultrasound could be performed further evaluation as clinically warranted. No calcified gallstones or biliary dilatation. Electronically Signed   By: Sebastian Ache M.D.   On: 08/02/2017 19:26    Procedures Procedures (including critical care time)  Medications Ordered in ED Medications  ondansetron (ZOFRAN) injection 4 mg (not administered)  sodium chloride 0.9 % bolus 1,000 mL (1,000 mLs Intravenous New Bag/Given 08/03/17 1206)  ondansetron (ZOFRAN) injection 4 mg (4 mg Intravenous Given 08/03/17 1203)  morphine 4 MG/ML injection 4 mg (4 mg  Intravenous Given 08/03/17 1203)  cefTRIAXone (ROCEPHIN) 1 g in dextrose 5 % 50 mL IVPB (0 g Intravenous Stopped 08/03/17 1238)  potassium chloride SA (K-DUR,KLOR-CON) CR tablet 40 mEq (40 mEq Oral Given 08/03/17 1255)  potassium chloride SA (K-DUR,KLOR-CON) CR tablet 40 mEq (40 mEq Oral Given 08/03/17 1311)     Initial Impression / Assessment and Plan / ED Course  I have reviewed the triage vital signs and the nursing notes.  Pertinent labs & imaging results that were available during my care of the patient were reviewed by me and considered in my medical decision making (see chart for details).     Records are reviewed.  Urine culture from 1 day ago is growing gram-negative rods.  No bacteria or antibiotic specificity yet.  She also had CT scan of the abdomen yesterday showing pyelonephritis and no other acute intra-abdominal processes.  SHe is afebrile, mildly tachycardic, non-peritoneal abdomen.  She is nontoxic and in no acute distress.  Blood work does show persistent leukocytosis and mild acute kidney injury.  She does have hypokalemia of 2.9, likely related to her GI symptoms.  Is given oral potassium repletion.  She is given IV fluids, repeat dose of antibiotics, and antiemetics.  She does feel significantly improved.  She would like to go home for continued outpatient management.  She states that she had not gotten any of her prescriptions filled which is why she had felt worse.  Her husband has now filled her prescriptions.  We will also send her home with antiemetics and potassium repletion.  I have discussed strict return precautions and discussed need for admission if symptoms worse or persistent vomiting. Strict return and follow-up instructions reviewed. She expressed understanding of all discharge instructions and felt comfortable with the plan of care.  She received bactrim and hydrocodone yesterday for prescriptions     Update: upon discharged, I was notified that blood  cultures from one day ago are just resulted and positive for ecoli. Discussed admission for her into the hospital. Blood cultures repeated. Spoke with Clydie Braun black who will admit  Final Clinical Impressions(s) / ED Diagnoses   Final diagnoses:  Pyelonephritis    ED Discharge Orders        Ordered    potassium chloride SA (K-DUR,KLOR-CON) 20 MEQ tablet  2 times daily     08/03/17 1306  ondansetron (ZOFRAN) 4 MG tablet  Every 6 hours PRN     08/03/17 1306       Lavera GuiseLiu, Zipporah Finamore Duo, MD 08/03/17 1314    Lavera GuiseLiu, Bransen Fassnacht Duo, MD 08/03/17 [phone]

## 2017-08-03 NOTE — ED Triage Notes (Signed)
Patient arrived hyperventilating stating that she developed nausea and vomiting with diarrhea during the night, was seen yesterday and diagnosed with UTI and has not gotten antibiotic filled, alert and oriented

## 2017-08-03 NOTE — ED Provider Notes (Signed)
Just after patient was discharged, I was notified that her blood cultures from 1 day ago with resulted and now positive for gram-negative rods.  Patient is still in the hospital, visiting her brother.  I have spoken with her over the telephone.  I have requested that she return to the ED to be admitted for positive blood cultures.  She has agreed to this plan and will come back to the ED for admission.   Lavera GuiseLiu, Dana Duo, MD 08/03/17 [phone]

## 2017-08-04 ENCOUNTER — Inpatient Hospital Stay (HOSPITAL_COMMUNITY): Payer: BLUE CROSS/BLUE SHIELD

## 2017-08-04 DIAGNOSIS — A4151 Sepsis due to Escherichia coli [E. coli]: Secondary | ICD-10-CM | POA: Diagnosis present

## 2017-08-04 DIAGNOSIS — D509 Iron deficiency anemia, unspecified: Secondary | ICD-10-CM | POA: Diagnosis present

## 2017-08-04 DIAGNOSIS — R7881 Bacteremia: Secondary | ICD-10-CM | POA: Diagnosis not present

## 2017-08-04 DIAGNOSIS — E876 Hypokalemia: Secondary | ICD-10-CM | POA: Diagnosis present

## 2017-08-04 DIAGNOSIS — K219 Gastro-esophageal reflux disease without esophagitis: Secondary | ICD-10-CM | POA: Diagnosis present

## 2017-08-04 DIAGNOSIS — N179 Acute kidney failure, unspecified: Secondary | ICD-10-CM | POA: Diagnosis present

## 2017-08-04 DIAGNOSIS — Z87440 Personal history of urinary (tract) infections: Secondary | ICD-10-CM | POA: Diagnosis not present

## 2017-08-04 DIAGNOSIS — Z888 Allergy status to other drugs, medicaments and biological substances status: Secondary | ICD-10-CM | POA: Diagnosis not present

## 2017-08-04 DIAGNOSIS — I10 Essential (primary) hypertension: Secondary | ICD-10-CM | POA: Diagnosis present

## 2017-08-04 DIAGNOSIS — Z886 Allergy status to analgesic agent status: Secondary | ICD-10-CM | POA: Diagnosis not present

## 2017-08-04 DIAGNOSIS — N12 Tubulo-interstitial nephritis, not specified as acute or chronic: Secondary | ICD-10-CM | POA: Diagnosis not present

## 2017-08-04 DIAGNOSIS — E872 Acidosis: Secondary | ICD-10-CM | POA: Diagnosis present

## 2017-08-04 DIAGNOSIS — N151 Renal and perinephric abscess: Secondary | ICD-10-CM | POA: Diagnosis present

## 2017-08-04 DIAGNOSIS — F17210 Nicotine dependence, cigarettes, uncomplicated: Secondary | ICD-10-CM | POA: Diagnosis present

## 2017-08-04 DIAGNOSIS — Z8711 Personal history of peptic ulcer disease: Secondary | ICD-10-CM | POA: Diagnosis not present

## 2017-08-04 DIAGNOSIS — N10 Acute tubulo-interstitial nephritis: Secondary | ICD-10-CM | POA: Diagnosis present

## 2017-08-04 LAB — BASIC METABOLIC PANEL
Anion gap: 9 (ref 5–15)
BUN: 8 mg/dL (ref 6–20)
CO2: 15 mmol/L — ABNORMAL LOW (ref 22–32)
Calcium: 8.1 mg/dL — ABNORMAL LOW (ref 8.9–10.3)
Chloride: 115 mmol/L — ABNORMAL HIGH (ref 101–111)
Creatinine, Ser: 0.74 mg/dL (ref 0.44–1.00)
GFR calc Af Amer: >60 mL/min (ref >60)
GFR calc non Af Amer: >60 mL/min (ref >60)
Glucose, Bld: 91 mg/dL (ref 65–99)
Potassium: 3.9 mmol/L (ref 3.5–5.1)
Sodium: 139 mmol/L (ref 135–145)

## 2017-08-04 LAB — CBC
HCT: 30.2 % — ABNORMAL LOW (ref 36.0–46.0)
Hemoglobin: 10.3 g/dL — ABNORMAL LOW (ref 12.0–15.0)
MCH: 31.6 pg (ref 26.0–34.0)
MCHC: 34.1 g/dL (ref 30.0–36.0)
MCV: 92.6 fL (ref 78.0–100.0)
Platelets: 332 10*3/uL (ref 150–400)
RBC: 3.26 MIL/uL — ABNORMAL LOW (ref 3.87–5.11)
RDW: 14.4 % (ref 11.5–15.5)
WBC: 12.6 10*3/uL — ABNORMAL HIGH (ref 4.0–10.5)

## 2017-08-04 LAB — URINE CULTURE

## 2017-08-04 LAB — HEPATIC FUNCTION PANEL
ALT: 24 U/L (ref 14–54)
AST: 22 U/L (ref 15–41)
Albumin: 2.3 g/dL — ABNORMAL LOW (ref 3.5–5.0)
Alkaline Phosphatase: 85 U/L (ref 38–126)
Bilirubin, Direct: 0.1 mg/dL (ref 0.1–0.5)
Indirect Bilirubin: 0.4 mg/dL (ref 0.3–0.9)
Total Bilirubin: 0.5 mg/dL (ref 0.3–1.2)
Total Protein: 5.4 g/dL — ABNORMAL LOW (ref 6.5–8.1)

## 2017-08-04 LAB — HIV ANTIBODY (ROUTINE TESTING W REFLEX): HIV Screen 4th Generation wRfx: NONREACTIVE

## 2017-08-04 IMAGING — NM NM HEPATOBILIARY IMAGE, INC GB
1 series · 6 of 6 positions shown · non-contrast
Comparison: CT scan [DATE] and ultrasound [DATE]

CLINICAL DATA: Gallbladder wall thickening and gallbladder sludge
seen on ultrasound [DATE] assess for acute cholecystitis

EXAM:
NUCLEAR MEDICINE HEPATOBILIARY IMAGING
TECHNIQUE: Sequential images of the abdomen were obtained [DATE] minutes
following intravenous administration of radiopharmaceutical.
RADIOPHARMACEUTICALS:  5.2 mCi [O1]  Choletec IV

[Series 1: biliary · 4.14mm/px · 6 of 60 frames shown]
[frame 6/60]
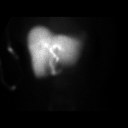
[frame 16/60]
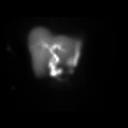
[frame 26/60]
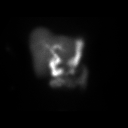
[frame 36/60]
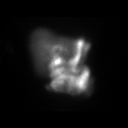
[frame 46/60]
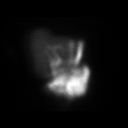
[frame 56/60]
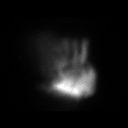

[6 of 6 positions shown; findings below may reference images not displayed]

FINDINGS: There is prompt uptake of radiotracer in the liver with prompt
excretion into the small bowel and reflux into the stomach. No
filling of the gallbladder by the end of the first hour of imaging.
The patient refused to continue the study after the first hour and
morphine was not utilized
IMPRESSION: 1. The gallbladder did not fill within the first hour of imaging.
The patient refused to continue the study and morphine was not
utilized. As a result, the findings are nonspecific. Acute
cholecystitis is not excluded or confirmed on this incomplete study.

## 2017-08-04 MED ORDER — TECHNETIUM TC 99M MEBROFENIN IV KIT
5.0000 | PACK | Freq: Once | INTRAVENOUS | Status: AC | PRN
  Administered 2017-08-04: 5 via INTRAVENOUS

## 2017-08-04 MED ORDER — ACETAMINOPHEN 500 MG PO TABS: 1000.0000 mg | ORAL_TABLET | Freq: Four times a day (QID) | ORAL | Status: DC | PRN

## 2017-08-04 MED ORDER — PIPERACILLIN-TAZOBACTAM 3.375 G IVPB
3.3750 g | Freq: Three times a day (TID) | INTRAVENOUS | Status: DC
  Administered 2017-08-04 – 2017-08-06 (×6): 3.375 g via INTRAVENOUS
  Filled 2017-08-04 (×6): qty 50

## 2017-08-04 MED ORDER — SODIUM CHLORIDE 0.9 % IV SOLN
INTRAVENOUS | Status: DC
  Administered 2017-08-04 – 2017-08-07 (×9): via INTRAVENOUS

## 2017-08-04 MED ORDER — ACETAMINOPHEN 10 MG/ML IV SOLN
1000.0000 mg | Freq: Once | INTRAVENOUS | Status: AC
  Administered 2017-08-04: 1000 mg via INTRAVENOUS
  Filled 2017-08-04: qty 100

## 2017-08-04 MED ORDER — SODIUM CHLORIDE 0.9 % IV BOLUS (SEPSIS)
1000.0000 mL | Freq: Once | INTRAVENOUS | Status: AC
  Administered 2017-08-04: 1000 mL via INTRAVENOUS

## 2017-08-04 MED ORDER — SODIUM CHLORIDE 0.9 % IV BOLUS (SEPSIS)
500.0000 mL | Freq: Once | INTRAVENOUS | Status: AC
  Administered 2017-08-04: 500 mL via INTRAVENOUS

## 2017-08-04 NOTE — Progress Notes (Addendum)
PROGRESS NOTE    Shirley [name]  ZOX:[id number] DOB: 15-Oct-1975 DOA: 08/03/2017 PCP: [facility]    Brief Narrative: Shirley [name] is a 41 y.o. female with medical history significant of hypertension, recurrent urinary tract infections, GERD, anemia presents to the emergency Department chief complaint persistent nausea vomiting flank pain. Initial evaluation reveals fever leukocytosis urinalysis from yesterday consistent with UTI and positive blood cultures from yesterday. [facility] asked to admit     Assessment & Plan:   Principal Problem:   Bacteremia Active Problems:   Essential hypertension   Anemia   Pyelonephritis   Hypokalemia   Tobacco use   Bacteremia; E coli, enterobacter;  Blood culture 11-23 positive for E coli, enterobacter.  Repeated blood cultures 11-24 pendning.  IV ceftriaxone.  Follow sensitivity.   Pyelonephritis; ? Kidney abscess;  CT scan 11-23; consistent with pyelonephritis. Possible developing 1.6 cm abscess in the left kidney.  -WBC decreased from 20---12.  -continue with IV ceftriaxone. Follow culture.  -Urology will reviewed CT scan and will give [place] recommendation regarding abscess.  Discussed with DR wagner, he reviewed last CT scan, probably phlegmon, no big enough to be drain. Plan to repeat CT scan in 24 hours. Also need results of HIDA scan.   Cholecystitis; ? Abnormal RUQ [place]; Sludge gallbladder. Gallbladder thickening. Finding are indeterminate for acute cholecystitis.  LFT normal.  Will get HIDA scan.  Patient very tender on palpation on right side abdomen. Surgery consulted.   Metabolic acidosis;  Continue with IV fluids.  IV bolus. IV antibiotics.    Hypokalemia ; resolved.    HTN; continue with clonidine with holder parameters.   Anemia; iron deficiency;  Hb stable.  Start oral iron when infection is controlled.   Tabbacco use; counseling provide.    History of colitis 03-2017.  Treated with cipro and flagyl.   DVT prophylaxis: Lovenox Code Status: full code.  Family Communication: care discussed with patient.  Disposition Plan: remain in patient.    Consultants:   Surgery  Urology    Procedures:  RUQ [place]; Sludge within the gallbladder lumen. Gallbladder wall thickening. Findings are indeterminate for acute cholecystitis. Consider further  evaluation with HIDA scan as clinically indicated.  Antimicrobials:  Ceftriaxone 11-24   Subjective: She report abdominal pain after eating for some time now.  She has not been eating lately.  She report right side Abdominal pain. Back pain   Objective: Vitals:   08/03/17 1806 08/03/17 2053 08/04/17 0148 08/04/17 0516  BP: (!) 152/89 128/83 (!) 141/82 124/74  Pulse: (!) 102 81 95 86  Resp:  16 16 16   Temp: (!) 102.2 F (39 C) 98.7 F (37.1 C) 99.7 F (37.6 C) 99.8 F (37.7 C)  TempSrc: Oral Oral Oral Oral  SpO2:  100% 100% 100%  Weight:      Height:        Intake/Output Summary (Last 24 hours) at 08/04/2017 0751 Last data filed at 08/04/2017 8119 Gross per 24 hour  Intake 2823.34 ml  Output -  Net 2823.34 ml   Filed Weights   08/03/17 1742  Weight: 56.7 kg (125 lb)    Examination:  General exam: Appears calm and comfortable  Respiratory system: Clear to auscultation. Respiratory effort normal. Cardiovascular system: S1 & S2 heard, RRR. No JVD, murmurs, rubs, gallops or clicks. No pedal edema. Gastrointestinal system: Soft, very tender on palpation epigastrium and right side abdomen. .  Central nervous system: Alert and oriented. No focal  neurological deficits. Extremities: Symmetric 5 x 5 power. Skin: No rashes, lesions or ulcers Psychiatry: Judgement and insight appear normal. Mood & affect appropriate.     Data Reviewed: I have personally reviewed following labs and imaging studies  CBC: Recent Labs  Lab 08/02/17 1703 08/03/17 1109  WBC 19.7* 20.1*  NEUTROABS 17.9* 18.5*    HGB 11.5* 10.7*  HCT 33.5* 30.8*  MCV 93.1 91.9  PLT 326 345   Basic Metabolic Panel: Recent Labs  Lab 08/02/17 1703 08/03/17 1109 08/03/17 1158  NA 134* 138  --   K 3.4* 2.9*  --   CL 104 109  --   CO2 18* 18*  --   GLUCOSE 104* 125*  --   BUN 9 9  --   CREATININE 1.01* 0.89  --   CALCIUM 8.8* 8.6*  --   MG  --   --  2.2   GFR: Estimated Creatinine Clearance: 66.5 mL/min (by C-G formula based on SCr of 0.89 mg/dL). Liver Function Tests: Recent Labs  Lab 08/02/17 1703 08/03/17 1109  AST 28 20  ALT 34 28  ALKPHOS 115 111  BILITOT 1.0 0.6  PROT 7.4 7.0  ALBUMIN 3.4* 3.1*   Recent Labs  Lab 08/02/17 1740  LIPASE 24   No results for input(s): AMMONIA in the last 168 hours. Coagulation Profile: Recent Labs  Lab 08/02/17 1703  INR 1.16   Cardiac Enzymes: No results for input(s): CKTOTAL, CKMB, CKMBINDEX, TROPONINI in the last 168 hours. BNP (last 3 results) No results for input(s): PROBNP in the last 8760 hours. HbA1C: No results for input(s): HGBA1C in the last 72 hours. CBG: No results for input(s): GLUCAP in the last 168 hours. Lipid Profile: No results for input(s): CHOL, HDL, LDLCALC, TRIG, CHOLHDL, LDLDIRECT in the last 72 hours. Thyroid Function Tests: No results for input(s): TSH, T4TOTAL, FREET4, T3FREE, THYROIDAB in the last 72 hours. Anemia Panel: Recent Labs    08/03/17 2018 08/03/17 2019  VITAMINB12  --  1,939*  FOLATE 14.3  --   FERRITIN  --  76  TIBC  --  185*  IRON  --  10*  RETICCTPCT  --  0.4   Sepsis Labs: Recent Labs  Lab 08/02/17 1724 08/02/17 1832 08/03/17 1601  LATICACIDVEN 0.97 1.70 1.49    Recent Results (from the past 240 hour(s))  Culture, blood (Routine x 2)     Status: None (Preliminary result)   Collection Time: 08/02/17  5:03 PM  Result Value Ref Range Status   Specimen Description BLOOD RIGHT ANTECUBITAL  Final   Special Requests   Final    BOTTLES DRAWN AEROBIC AND ANAEROBIC Blood Culture adequate  volume   Culture  Setup Time   Final    GRAM NEGATIVE RODS IN BOTH AEROBIC AND ANAEROBIC BOTTLES CRITICAL RESULT CALLED TO, READ BACK BY AND VERIFIED WITH: S GOUGE,RN AT [id number] 08/03/17 BY L BENFIELD    Culture GRAM NEGATIVE RODS  Final   Report Status PENDING  Incomplete  Blood Culture ID Panel (Reflexed)     Status: Abnormal   Collection Time: 08/02/17  5:03 PM  Result Value Ref Range Status   Enterococcus species NOT DETECTED NOT DETECTED Final   Listeria monocytogenes NOT DETECTED NOT DETECTED Final   Staphylococcus species NOT DETECTED NOT DETECTED Final   Staphylococcus aureus NOT DETECTED NOT DETECTED Final   Streptococcus species NOT DETECTED NOT DETECTED Final   Streptococcus agalactiae NOT DETECTED NOT DETECTED Final   Streptococcus pneumoniae  NOT DETECTED NOT DETECTED Final   Streptococcus pyogenes NOT DETECTED NOT DETECTED Final   Acinetobacter baumannii NOT DETECTED NOT DETECTED Final   Enterobacteriaceae species DETECTED (A) NOT DETECTED Final    Comment: Enterobacteriaceae represent a large family of gram-negative bacteria, not a single organism. CRITICAL RESULT CALLED TO, READ BACK BY AND VERIFIED WITH: S GOUGE,RN AT [id number] 08/03/17 BY L BENFIELD    Enterobacter cloacae complex NOT DETECTED NOT DETECTED Final   Escherichia coli DETECTED (A) NOT DETECTED Final    Comment: CRITICAL RESULT CALLED TO, READ BACK BY AND VERIFIED WITH: S GOUGE,RN AT [id number] 08/03/17 BY L BENFIELD    Klebsiella oxytoca NOT DETECTED NOT DETECTED Final   Klebsiella pneumoniae NOT DETECTED NOT DETECTED Final   Proteus species NOT DETECTED NOT DETECTED Final   Serratia marcescens NOT DETECTED NOT DETECTED Final   Carbapenem resistance NOT DETECTED NOT DETECTED Final   Haemophilus influenzae NOT DETECTED NOT DETECTED Final   Neisseria meningitidis NOT DETECTED NOT DETECTED Final   Pseudomonas aeruginosa NOT DETECTED NOT DETECTED Final   Candida albicans NOT DETECTED NOT DETECTED Final   Candida  glabrata NOT DETECTED NOT DETECTED Final   Candida krusei NOT DETECTED NOT DETECTED Final   Candida parapsilosis NOT DETECTED NOT DETECTED Final   Candida tropicalis NOT DETECTED NOT DETECTED Final  Culture, blood (Routine x 2)     Status: None (Preliminary result)   Collection Time: 08/02/17  5:46 PM  Result Value Ref Range Status   Specimen Description BLOOD RIGHT ANTECUBITAL  Final   Special Requests   Final    BOTTLES DRAWN AEROBIC AND ANAEROBIC Blood Culture adequate volume   Culture  Setup Time   Final    GRAM NEGATIVE RODS AEROBIC BOTTLE ONLY CRITICAL RESULT CALLED TO, READ BACK BY AND VERIFIED WITH: S GOUGE,RN AT 1004 08/03/17 BY L BENFIELD    Culture GRAM NEGATIVE RODS  Final   Report Status PENDING  Incomplete         Radiology Studies: Dg Chest 2 View  Result Date: 08/02/2017 CLINICAL DATA:  Chest pain, fever, chills. EXAM: CHEST  2 VIEW COMPARISON:  Radiograph of May 09, 2017. FINDINGS: The heart size and mediastinal contours are within normal limits. Both lungs are clear. No pneumothorax or pleural effusion is noted. The visualized skeletal structures are unremarkable. IMPRESSION: No active cardiopulmonary disease. Electronically Signed   By: Lupita RaiderJames  Green Jr, M.D.   On: 08/02/2017 17:23   Ct Head Wo Contrast  Result Date: 08/02/2017 CLINICAL DATA:  Lightheadedness and headache for 3 days. EXAM: CT HEAD WITHOUT CONTRAST TECHNIQUE: Contiguous axial images were obtained from the base of the skull through the vertex without intravenous contrast. COMPARISON:  Head CT scan 05/09/2017 02/12/2007. FINDINGS: Brain: Appears normal without hemorrhage, infarct, mass lesion, mass effect, midline shift or abnormal extra-axial fluid collection. No hydrocephalus or pneumocephalus. Vascular: Negative. Skull: Intact. Sinuses/Orbits: Negative. Other: None. IMPRESSION: Normal head CT. Electronically Signed   By: Drusilla Kannerhomas  Dalessio M.D.   On: 08/02/2017 19:08   Ct Abdomen Pelvis W  Contrast  Result Date: 08/02/2017 CLINICAL DATA:  Lower abdominal pain beginning 2 days ago, greater in the right lower quadrant today. EXAM: CT ABDOMEN AND PELVIS WITH CONTRAST TECHNIQUE: Multidetector CT imaging of the abdomen and pelvis was performed using the standard protocol following bolus administration of intravenous contrast. CONTRAST:  100mL ISOVUE-300 IOPAMIDOL (ISOVUE-300) INJECTION 61% COMPARISON:  04/02/2017 FINDINGS: Lower chest: Minimal dependent atelectasis in the lung bases. No pleural effusion. Hepatobiliary: No  focal liver abnormality is seen. The gallbladder is moderately distended with possible mild wall thickening. No calcified gallstones are identified, although there may be subtly hyperattenuating material dependently in the gallbladder which could reflect sludge. No biliary dilatation. Pancreas: Unremarkable. Spleen: Unremarkable. Adrenals/Urinary Tract: Unremarkable adrenal glands. There is a 1.6 cm well-defined low density focus laterally in the interpolar left kidney which is new. Additional patchy areas of more hypoenhancement are present in both kidneys, with the most extensive involvement being in the lower pole on the right. There is mild perinephric stranding inferiorly on the right. There is no hydronephrosis. The bladder is unremarkable. Stomach/Bowel: The stomach is within normal limits. There is no evidence of bowel obstruction. The appendix is not confidently identified, however no significant inflammatory changes are seen about the cecum to strongly suggest acute appendicitis. Vascular/Lymphatic: Minimal atherosclerotic calcification of the distal abdominal aorta. No enlarged lymph nodes. Reproductive: Unremarkable uterus and right adnexa. Suspected left ovarian corpus luteum. Other: Small volume pelvic free fluid. Unchanged 14 mm hypodense focus in the upper anterior abdominal wall which may reflect fluid in a small ventral hernia. Musculoskeletal: No acute osseous  abnormality or suspicious osseous lesion. IMPRESSION: 1. Patchy hypoenhancement in both kidneys consistent with pyelonephritis. Possible developing 1.6 cm abscess in the left kidney. 2. Distended gallbladder with possible mild wall thickening. Right upper quadrant ultrasound could be performed further evaluation as clinically warranted. No calcified gallstones or biliary dilatation. Electronically Signed   By: Sebastian Ache M.D.   On: 08/02/2017 19:26   US Abdomen Limited Ruq  Result Date: 08/03/2017 CLINICAL DATA:  Patient with abdominal pain and vomiting. EXAM: ULTRASOUND ABDOMEN LIMITED RIGHT UPPER QUADRANT COMPARISON:  CT abdomen 08/02/2017 FINDINGS: Gallbladder: Sludge within the gallbladder lumen. Mild gallbladder wall thickening. Negative sonographic Murphy's sign. No significant pericholecystic fluid. Common bile duct: Diameter: 4 mm Liver: No focal lesion identified. Within normal limits in parenchymal echogenicity. Portal vein is patent on color Doppler imaging with normal direction of blood flow towards the liver. IMPRESSION: Sludge within the gallbladder lumen. Gallbladder wall thickening. Findings are indeterminate for acute cholecystitis. Consider further evaluation with HIDA scan as clinically indicated. Electronically Signed   By: Annia Belt M.D.   On: 08/03/2017 19:34        Scheduled Meds: . cloNIDine  0.2 mg Oral BID  . enoxaparin (LOVENOX) injection  40 mg Subcutaneous Daily   Continuous Infusions: . sodium chloride 100 mL/hr at 08/04/17 0405  . cefTRIAXone (ROCEPHIN)  IV       LOS: 0 days    Time spent: 35 minutes.     Alba Cory, MD [facility] Pager [phone]  If 7PM-7AM, please contact night-coverage [email or web address] Password [facility] 08/04/2017, 7:51 AM
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
## 2017-08-04 NOTE — Progress Notes (Signed)
Pharmacy Antibiotic Note  Shirley [name] is a 41 y.o. female admitted on 08/03/2017 being treated for pyelonephritis and possible cholecystitis, now with sepsis.  Pharmacy has been consulted for Zosyn dosing. WBC trending down. Still febrile.   Plan: Zosyn 3.375g IV q8h (4 hour infusion).  Monitor renal function, culture results, and clinical status.  F/u ability to narrow  Height: 5\' 2"  (157.5 cm) Weight: 125 lb (56.7 kg) IBW/kg (Calculated) : 50.1  Temp (24hrs), Avg:101.1 F (38.4 C), Min:98.7 F (37.1 C), Max:102.9 F (39.4 C)  Recent Labs  Lab 08/02/17 1703 08/02/17 1724 08/02/17 1832 08/03/17 1109 08/03/17 1601 08/04/17 0719  WBC 19.7*  --   --  20.1*  --  12.6*  CREATININE 1.01*  --   --  0.89  --  0.74  LATICACIDVEN  --  0.97 1.70  --  1.49  --     Estimated Creatinine Clearance: 73.9 mL/min (by C-G formula based on SCr of 0.74 mg/dL).    Allergies  Allergen Reactions  . Lisinopril Anaphylaxis    Feet, hands, and throat became swollen  . Aspirin Nausea Only    Makes stomach burn  . Ibuprofen Nausea Only    Makes stomach burn    Antimicrobials this admission: Zosyn 11/25 >>  Dose adjustments this admission:  Microbiology results: 11/24 BCx:  11/23 BCx: Ecoli - sens pending  Thank you for allowing pharmacy to be a part of this patient's care.  Link SnufferJessica Ilma Achee, PharmD, BCPS, BCCCP Clinical Pharmacist Clinical phone 08/04/2017 until 3:30PM [phone] After hours, please call [phone] 08/04/2017 2:07 PM

## 2017-08-04 NOTE — Progress Notes (Signed)
I was notified by nuclear radiology that pt refused to continue further imaging after about 1 hour worth of scanning.  Md was notified.  Upon arrival pt stated that she was given the choice to come back to the floor since "they couldn't see anything" and did not know that further imaging was needed.  Pt desires to be resumed to clear liquids diet.  MD advised to contact surgery team member for their input. Awaiting for their call.

## 2017-08-04 NOTE — Consult Note (Signed)
Urology Consult   Physician requesting consult: Alba Cory, MD  Reason for consult: Left renal abscess  History of Present Illness: Shirley [name] is a 41 y.o. is currently being treated for pyelonephritis and possible cholecystitis.  Urology was consulted to evaluate the patient after she was found to have a developing left renal abscess seen on CT of the abdomen/pelvis from 08/02/2017.  The patient has a history of recurrent urinary tract infections that have been treated with ciprofloxacin in the past. She states that she has 4-5 urinary tract infections per year that seemed to improve for short period of time following oral antibiotics, then recur. She initially presented with a several-day history of general malaise, constant dull left-sided flank pain and intermittent fevers.  Currently, she is still having left-sided flank pain and intermittent fevers/chills.  The patient denies illicit/IV drug use, a history of voiding or storage urinary symptoms, hematuria, STDs, urolithiasis, GU malignancy/trauma/surgery.  OB/GYN hx: G4P4 all c-section deliveries.    Past Medical History:  Diagnosis Date  . Anemia   . Bacteremia   . Blood dyscrasia    has to apply a lot of pressure to stop bleeding  . GERD (gastroesophageal reflux disease)   . Heart murmur    with pregnancy  . History of hiatal hernia   . Hypertension    4-5 years ago, no meds  . Kidney infection    patient states I might have a kidney infection  . Migraine    migraines  . Pyelonephritis     Past Surgical History:  Procedure Laterality Date  . ABDOMINAL HERNIA REPAIR    . CESAREAN SECTION     x 4  . CESAREAN SECTION     x 4  . ENDOMETRIAL ABLATION  07/31/2016  . HYSTEROSCOPY  07/31/2016   Procedure: HYSTEROSCOPY WITH HYDROTHERMAL ABLATION;  Surgeon: Collins Bing, MD;  Location: [facility];  Service: Gynecology;;    Current Hospital Medications:  Home Meds:  Current Meds  Medication Sig  . acetaminophen  (TYLENOL) 500 MG tablet Take 1,000 mg by mouth every 6 (six) hours as needed for moderate pain or headache.  . albuterol (PROVENTIL HFA;VENTOLIN HFA) 108 (90 Base) MCG/ACT inhaler Inhale 2 puffs into the lungs every 6 (six) hours as needed for wheezing or shortness of breath.  . cloNIDine (CATAPRES) 0.2 MG tablet Take 0.2 mg by mouth 2 (two) times daily.   . ondansetron (ZOFRAN-ODT) 8 MG disintegrating tablet Take 8 mg by mouth every 8 (eight) hours as needed for nausea or vomiting.  . polyethylene glycol powder (MIRALAX) powder 1 capful daily until normal bowel movements resume (Patient taking differently: Take by mouth daily as needed for mild constipation. )    Scheduled Meds: . cloNIDine  0.2 mg Oral BID  . enoxaparin (LOVENOX) injection  40 mg Subcutaneous Daily   Continuous Infusions: . sodium chloride 125 mL/hr at 08/04/17 0937  . acetaminophen    . cefTRIAXone (ROCEPHIN)  IV Stopped (08/04/17 1223)   PRN Meds:.acetaminophen, albuterol, diphenhydrAMINE, hydrALAZINE, HYDROcodone-acetaminophen, morphine injection, ondansetron **OR** ondansetron (ZOFRAN) IV, polyethylene glycol, traZODone  Allergies:  Allergies  Allergen Reactions  . Lisinopril Anaphylaxis    Feet, hands, and throat became swollen  . Aspirin Nausea Only    Makes stomach burn  . Ibuprofen Nausea Only    Makes stomach burn    Family History  Problem Relation Age of Onset  . Hypertension Mother   . Colon cancer Father 48  . Stroke Brother  Social History:  reports that she has been smoking cigarettes.  She has been smoking about 0.50 packs per day. she has never used smokeless tobacco. She reports that she uses drugs. Drug: Marijuana. She reports that she does not drink alcohol.  ROS: A complete review of systems was performed.  All systems are negative except for pertinent findings as noted.  Physical Exam:  Vital signs in last 24 hours: Temp:  [98.7 F (37.1 C)-102.9 F (39.4 C)] 102.9 F (39.4 C)  (11/25 1250) Pulse Rate:  [81-116] 101 (11/25 1250) Resp:  [15-17] 15 (11/25 1250) BP: (124-160)/(74-108) 145/89 (11/25 1250) SpO2:  [100 %] 100 % (11/25 1250) Weight:  [56.7 kg (125 lb)] 56.7 kg (125 lb) (11/24 1742) Constitutional:  Alert and oriented, No acute distress Cardiovascular: Regular rate and rhythm, No JVD Respiratory: Normal respiratory effort, Lungs clear bilaterally GI: Abdomen is soft, nontender, nondistended, no abdominal masses GU: Left CVA tenderness Lymphatic: No lymphadenopathy Neurologic: Grossly intact, no focal deficits Psychiatric: Normal mood and affect  Laboratory Data:  Recent Labs    08/02/17 1703 08/03/17 1109 08/04/17 0719  WBC 19.7* 20.1* 12.6*  HGB 11.5* 10.7* 10.3*  HCT 33.5* 30.8* 30.2*  PLT 326 345 332    Recent Labs    08/02/17 1703 08/03/17 1109 08/04/17 0719  NA 134* 138 139  K 3.4* 2.9* 3.9  CL 104 109 115*  GLUCOSE 104* 125* 91  BUN 9 9 8   CALCIUM 8.8* 8.6* 8.1*  CREATININE 1.01* 0.89 0.74     Results for orders placed or performed during the hospital encounter of 08/03/17 (from the past 24 hour(s))  I-Stat CG4 Lactic Acid, ED     Status: None   Collection Time: 08/03/17  4:01 PM  Result Value Ref Range   Lactic Acid, Venous 1.49 0.5 - 1.9 mmol/L  Occult blood card to lab, stool RN will collect     Status: None   Collection Time: 08/03/17  4:54 PM  Result Value Ref Range   Fecal Occult Bld NEGATIVE NEGATIVE  Folate     Status: None   Collection Time: 08/03/17  8:18 PM  Result Value Ref Range   Folate 14.3 >5.9 ng/mL  HIV antibody (Routine Testing)     Status: None   Collection Time: 08/03/17  8:19 PM  Result Value Ref Range   HIV Screen 4th Generation wRfx Non Reactive Non Reactive  Vitamin B12     Status: Abnormal   Collection Time: 08/03/17  8:19 PM  Result Value Ref Range   Vitamin B-12 1,939 (H) 180 - 914 pg/mL  Iron and TIBC     Status: Abnormal   Collection Time: 08/03/17  8:19 PM  Result Value Ref Range    Iron 10 (L) 28 - 170 ug/dL   TIBC 161185 (L) 096250 - 045450 ug/dL   Saturation Ratios 5 (L) 10.4 - 31.8 %   UIBC 175 ug/dL  Ferritin     Status: None   Collection Time: 08/03/17  8:19 PM  Result Value Ref Range   Ferritin 76 11 - 307 ng/mL  Reticulocytes     Status: Abnormal   Collection Time: 08/03/17  8:19 PM  Result Value Ref Range   Retic Ct Pct 0.4 0.4 - 3.1 %   RBC. 2.73 (L) 3.87 - 5.11 MIL/uL   Retic Count, Absolute 10.9 (L) 19.0 - 186.0 K/uL  Basic metabolic panel     Status: Abnormal   Collection Time: 08/04/17  7:19 AM  Result Value Ref Range   Sodium 139 135 - 145 mmol/L   Potassium 3.9 3.5 - 5.1 mmol/L   Chloride 115 (H) 101 - 111 mmol/L   CO2 15 (L) 22 - 32 mmol/L   Glucose, Bld 91 65 - 99 mg/dL   BUN 8 6 - 20 mg/dL   Creatinine, Ser 1.61 0.44 - 1.00 mg/dL   Calcium 8.1 (L) 8.9 - 10.3 mg/dL   GFR calc non Af Amer >60 >60 mL/min   GFR calc Af Amer >60 >60 mL/min   Anion gap 9 5 - 15  CBC     Status: Abnormal   Collection Time: 08/04/17  7:19 AM  Result Value Ref Range   WBC 12.6 (H) 4.0 - 10.5 K/uL   RBC 3.26 (L) 3.87 - 5.11 MIL/uL   Hemoglobin 10.3 (L) 12.0 - 15.0 g/dL   HCT 09.6 (L) 04.5 - 40.9 %   MCV 92.6 78.0 - 100.0 fL   MCH 31.6 26.0 - 34.0 pg   MCHC 34.1 30.0 - 36.0 g/dL   RDW 81.1 91.4 - 78.2 %   Platelets 332 150 - 400 K/uL  Hepatic function panel     Status: Abnormal   Collection Time: 08/04/17 10:06 AM  Result Value Ref Range   Total Protein 5.4 (L) 6.5 - 8.1 g/dL   Albumin 2.3 (L) 3.5 - 5.0 g/dL   AST 22 15 - 41 U/L   ALT 24 14 - 54 U/L   Alkaline Phosphatase 85 38 - 126 U/L   Total Bilirubin 0.5 0.3 - 1.2 mg/dL   Bilirubin, Direct 0.1 0.1 - 0.5 mg/dL   Indirect Bilirubin 0.4 0.3 - 0.9 mg/dL   Recent Results (from the past 240 hour(s))  Culture, blood (Routine x 2)     Status: Abnormal (Preliminary result)   Collection Time: 08/02/17  5:03 PM  Result Value Ref Range Status   Specimen Description BLOOD RIGHT ANTECUBITAL  Final   Special  Requests   Final    BOTTLES DRAWN AEROBIC AND ANAEROBIC Blood Culture adequate volume   Culture  Setup Time   Final    GRAM NEGATIVE RODS IN BOTH AEROBIC AND ANAEROBIC BOTTLES CRITICAL RESULT CALLED TO, READ BACK BY AND VERIFIED WITH: S GOUGE,RN AT 9562 08/03/17 BY L BENFIELD    Culture ESCHERICHIA COLI SUSCEPTIBILITIES TO FOLLOW  (A)  Final   Report Status PENDING  Incomplete  Blood Culture ID Panel (Reflexed)     Status: Abnormal   Collection Time: 08/02/17  5:03 PM  Result Value Ref Range Status   Enterococcus species NOT DETECTED NOT DETECTED Final   Listeria monocytogenes NOT DETECTED NOT DETECTED Final   Staphylococcus species NOT DETECTED NOT DETECTED Final   Staphylococcus aureus NOT DETECTED NOT DETECTED Final   Streptococcus species NOT DETECTED NOT DETECTED Final   Streptococcus agalactiae NOT DETECTED NOT DETECTED Final   Streptococcus pneumoniae NOT DETECTED NOT DETECTED Final   Streptococcus pyogenes NOT DETECTED NOT DETECTED Final   Acinetobacter baumannii NOT DETECTED NOT DETECTED Final   Enterobacteriaceae species DETECTED (A) NOT DETECTED Final    Comment: Enterobacteriaceae represent a large family of gram-negative bacteria, not a single organism. CRITICAL RESULT CALLED TO, READ BACK BY AND VERIFIED WITH: S GOUGE,RN AT 1308 08/03/17 BY L BENFIELD    Enterobacter cloacae complex NOT DETECTED NOT DETECTED Final   Escherichia coli DETECTED (A) NOT DETECTED Final    Comment: CRITICAL RESULT CALLED TO, READ BACK BY AND VERIFIED WITH: S  GOUGE,RN AT [id number] 08/03/17 BY L BENFIELD    Klebsiella oxytoca NOT DETECTED NOT DETECTED Final   Klebsiella pneumoniae NOT DETECTED NOT DETECTED Final   Proteus species NOT DETECTED NOT DETECTED Final   Serratia marcescens NOT DETECTED NOT DETECTED Final   Carbapenem resistance NOT DETECTED NOT DETECTED Final   Haemophilus influenzae NOT DETECTED NOT DETECTED Final   Neisseria meningitidis NOT DETECTED NOT DETECTED Final    Pseudomonas aeruginosa NOT DETECTED NOT DETECTED Final   Candida albicans NOT DETECTED NOT DETECTED Final   Candida glabrata NOT DETECTED NOT DETECTED Final   Candida krusei NOT DETECTED NOT DETECTED Final   Candida parapsilosis NOT DETECTED NOT DETECTED Final   Candida tropicalis NOT DETECTED NOT DETECTED Final  Culture, blood (Routine x 2)     Status: None (Preliminary result)   Collection Time: 08/02/17  5:46 PM  Result Value Ref Range Status   Specimen Description BLOOD RIGHT ANTECUBITAL  Final   Special Requests   Final    BOTTLES DRAWN AEROBIC AND ANAEROBIC Blood Culture adequate volume   Culture  Setup Time   Final    GRAM NEGATIVE RODS AEROBIC BOTTLE ONLY CRITICAL RESULT CALLED TO, READ BACK BY AND VERIFIED WITH: S GOUGE,RN AT 1004 08/03/17 BY L BENFIELD    Culture GRAM NEGATIVE RODS  Final   Report Status PENDING  Incomplete  Urine culture     Status: Abnormal   Collection Time: 08/02/17  8:08 PM  Result Value Ref Range Status   Specimen Description URINE, RANDOM  Final   Special Requests URINE, CLEAN CATCH  Final   Culture MULTIPLE SPECIES PRESENT, SUGGEST RECOLLECTION (A)  Final   Report Status 08/04/2017 FINAL  Final    Renal Function: Recent Labs    08/02/17 1703 08/03/17 1109 08/04/17 0719  CREATININE 1.01* 0.89 0.74   Estimated Creatinine Clearance: 73.9 mL/min (by C-G formula based on SCr of 0.74 mg/dL).  Radiologic Imaging: Dg Chest 2 View  Result Date: 08/02/2017 CLINICAL DATA:  Chest pain, fever, chills. EXAM: CHEST  2 VIEW COMPARISON:  Radiograph of May 09, 2017. FINDINGS: The heart size and mediastinal contours are within normal limits. Both lungs are clear. No pneumothorax or pleural effusion is noted. The visualized skeletal structures are unremarkable. IMPRESSION: No active cardiopulmonary disease. Electronically Signed   By: Lupita RaiderJames  Green Jr, M.D.   On: 08/02/2017 17:23   Ct Head Wo Contrast  Result Date: 08/02/2017 CLINICAL DATA:   Lightheadedness and headache for 3 days. EXAM: CT HEAD WITHOUT CONTRAST TECHNIQUE: Contiguous axial images were obtained from the base of the skull through the vertex without intravenous contrast. COMPARISON:  Head CT scan 05/09/2017 02/12/2007. FINDINGS: Brain: Appears normal without hemorrhage, infarct, mass lesion, mass effect, midline shift or abnormal extra-axial fluid collection. No hydrocephalus or pneumocephalus. Vascular: Negative. Skull: Intact. Sinuses/Orbits: Negative. Other: None. IMPRESSION: Normal head CT. Electronically Signed   By: Drusilla Kannerhomas  Dalessio M.D.   On: 08/02/2017 19:08   Ct Abdomen Pelvis W Contrast  Result Date: 08/02/2017 CLINICAL DATA:  Lower abdominal pain beginning 2 days ago, greater in the right lower quadrant today. EXAM: CT ABDOMEN AND PELVIS WITH CONTRAST TECHNIQUE: Multidetector CT imaging of the abdomen and pelvis was performed using the standard protocol following bolus administration of intravenous contrast. CONTRAST:  100mL ISOVUE-300 IOPAMIDOL (ISOVUE-300) INJECTION 61% COMPARISON:  04/02/2017 FINDINGS: Lower chest: Minimal dependent atelectasis in the lung bases. No pleural effusion. Hepatobiliary: No focal liver abnormality is seen. The gallbladder is moderately distended with possible  mild wall thickening. No calcified gallstones are identified, although there may be subtly hyperattenuating material dependently in the gallbladder which could reflect sludge. No biliary dilatation. Pancreas: Unremarkable. Spleen: Unremarkable. Adrenals/Urinary Tract: Unremarkable adrenal glands. There is a 1.6 cm well-defined low density focus laterally in the interpolar left kidney which is new. Additional patchy areas of more hypoenhancement are present in both kidneys, with the most extensive involvement being in the lower pole on the right. There is mild perinephric stranding inferiorly on the right. There is no hydronephrosis. The bladder is unremarkable. Stomach/Bowel: The stomach  is within normal limits. There is no evidence of bowel obstruction. The appendix is not confidently identified, however no significant inflammatory changes are seen about the cecum to strongly suggest acute appendicitis. Vascular/Lymphatic: Minimal atherosclerotic calcification of the distal abdominal aorta. No enlarged lymph nodes. Reproductive: Unremarkable uterus and right adnexa. Suspected left ovarian corpus luteum. Other: Small volume pelvic free fluid. Unchanged 14 mm hypodense focus in the upper anterior abdominal wall which may reflect fluid in a small ventral hernia. Musculoskeletal: No acute osseous abnormality or suspicious osseous lesion. IMPRESSION: 1. Patchy hypoenhancement in both kidneys consistent with pyelonephritis. Possible developing 1.6 cm abscess in the left kidney. 2. Distended gallbladder with possible mild wall thickening. Right upper quadrant ultrasound could be performed further evaluation as clinically warranted. No calcified gallstones or biliary dilatation. Electronically Signed   By: Sebastian Ache M.D.   On: 08/02/2017 19:26   US Abdomen Limited Ruq  Result Date: 08/03/2017 CLINICAL DATA:  Patient with abdominal pain and vomiting. EXAM: ULTRASOUND ABDOMEN LIMITED RIGHT UPPER QUADRANT COMPARISON:  CT abdomen 08/02/2017 FINDINGS: Gallbladder: Sludge within the gallbladder lumen. Mild gallbladder wall thickening. Negative sonographic Murphy's sign. No significant pericholecystic fluid. Common bile duct: Diameter: 4 mm Liver: No focal lesion identified. Within normal limits in parenchymal echogenicity. Portal vein is patent on color Doppler imaging with normal direction of blood flow towards the liver. IMPRESSION: Sludge within the gallbladder lumen. Gallbladder wall thickening. Findings are indeterminate for acute cholecystitis. Consider further evaluation with HIDA scan as clinically indicated. Electronically Signed   By: Annia Belt M.D.   On: 08/03/2017 19:34    I  independently reviewed the above imaging studies.  Impression/Recommendation  1.  Pyelonephritis/left renal abscess with Enterobacteriaceae/E. Coli bacteremia-  Recommend IR eval the left renal hypodensity concerning for an abscess and see if it is amendable to percutaneous aspiration and/or drain placement.  Continue abx coverage with Rocephin until sensitivities return.  She will need a total of 6 weeks of abx coverage. No acute surgical interventions, from a GU standpoint, needed at this time.    Rhoderick Moody, MD [facility] 08/04/2017, 12:58 PM

## 2017-08-05 ENCOUNTER — Inpatient Hospital Stay (HOSPITAL_COMMUNITY): Payer: BLUE CROSS/BLUE SHIELD

## 2017-08-05 LAB — CBC
HCT: 26.9 % — ABNORMAL LOW (ref 36.0–46.0)
Hemoglobin: 9 g/dL — ABNORMAL LOW (ref 12.0–15.0)
MCH: 30.8 pg (ref 26.0–34.0)
MCHC: 33.5 g/dL (ref 30.0–36.0)
MCV: 92.1 fL (ref 78.0–100.0)
Platelets: 351 10*3/uL (ref 150–400)
RBC: 2.92 MIL/uL — ABNORMAL LOW (ref 3.87–5.11)
RDW: 14.6 % (ref 11.5–15.5)
WBC: 6.1 10*3/uL (ref 4.0–10.5)

## 2017-08-05 LAB — PROTIME-INR
INR: 1.09
Prothrombin Time: 14 seconds (ref 11.4–15.2)

## 2017-08-05 LAB — BASIC METABOLIC PANEL
Anion gap: 6 (ref 5–15)
BUN: 5 mg/dL — ABNORMAL LOW (ref 6–20)
CO2: 17 mmol/L — ABNORMAL LOW (ref 22–32)
Calcium: 7.9 mg/dL — ABNORMAL LOW (ref 8.9–10.3)
Chloride: 114 mmol/L — ABNORMAL HIGH (ref 101–111)
Creatinine, Ser: 0.66 mg/dL (ref 0.44–1.00)
GFR calc Af Amer: >60 mL/min (ref >60)
GFR calc non Af Amer: >60 mL/min (ref >60)
Glucose, Bld: 99 mg/dL (ref 65–99)
Potassium: 3.2 mmol/L — ABNORMAL LOW (ref 3.5–5.1)
Sodium: 137 mmol/L (ref 135–145)

## 2017-08-05 LAB — CULTURE, BLOOD (ROUTINE X 2): Special Requests: ADEQUATE

## 2017-08-05 IMAGING — CT CT ABD-PELV W/ CM
2 of 5 series · 15 of 46 positions shown, 17 images · IV contrast (iopamidol)
Comparison: [DATE]

CLINICAL DATA: Pyelonephritis. Nausea vomiting and diarrhea.
Abdominal pain and fever.

EXAM:
CT ABDOMEN AND PELVIS WITH CONTRAST
TECHNIQUE: Multidetector CT imaging of the abdomen and pelvis was performed
using the standard protocol following bolus administration of
intravenous contrast.
CONTRAST:  100mL [XT] IOPAMIDOL ([XT]) INJECTION 61%

[Series 3: a/p w/ 5mm · axial · 0.71mm/px · z∈[-446,-21]mm · 12 of 96 slices shown, 14 images]
[im 6/96  soft-tissue]
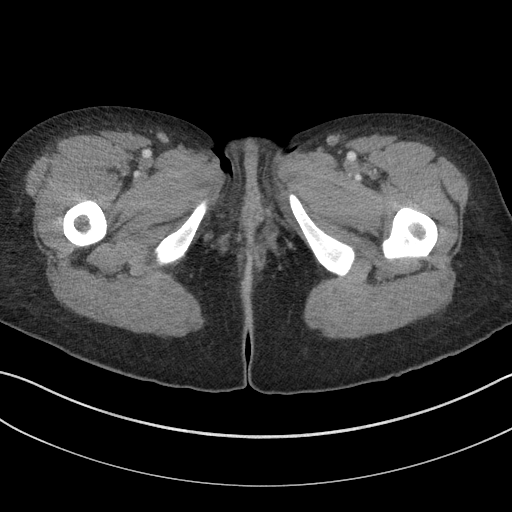
[im 6/96  bone]
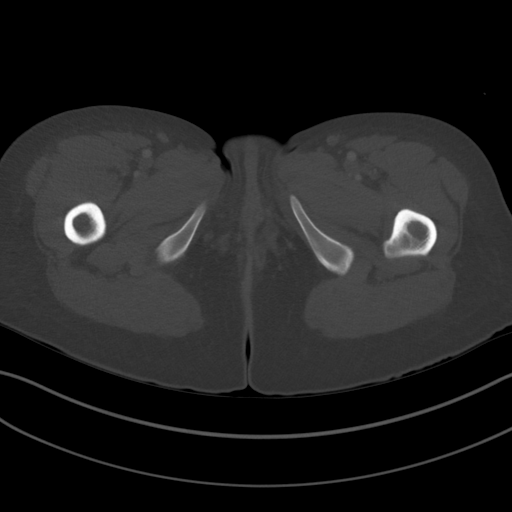
[im 16/96  soft-tissue]
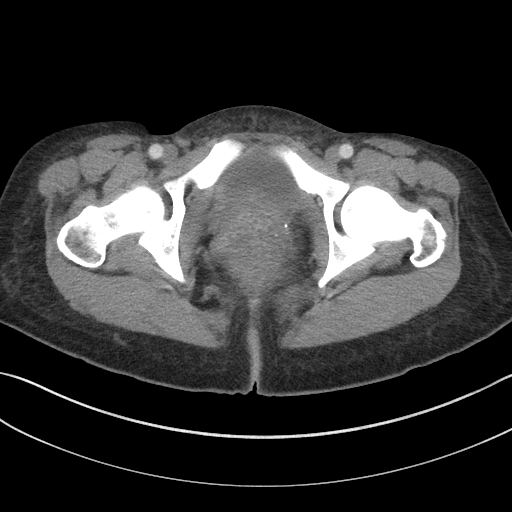
[im 21/96  soft-tissue]
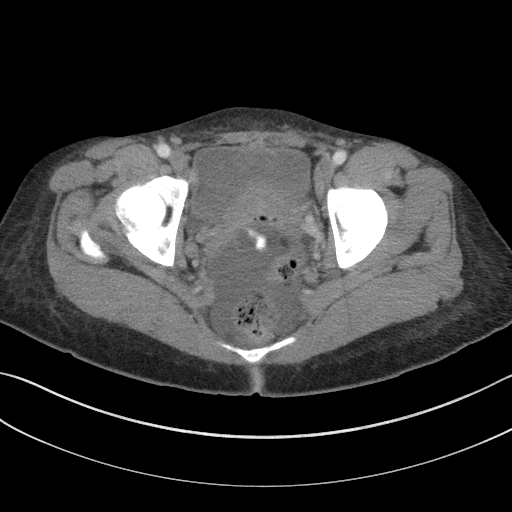
[im 31/96  soft-tissue]
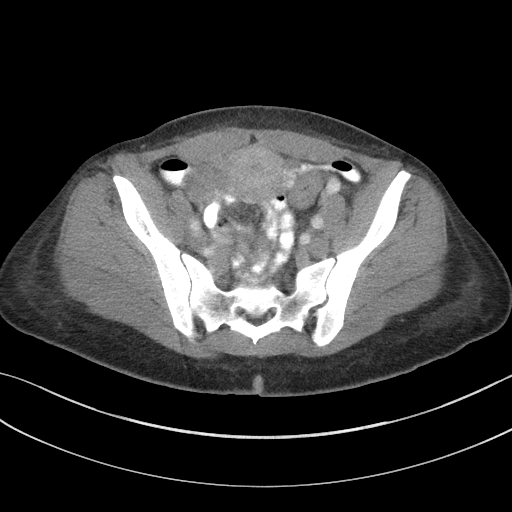
[im 36/96  soft-tissue]
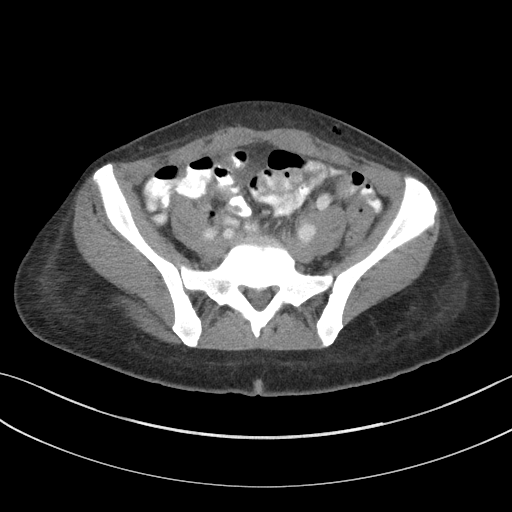
[im 46/96  soft-tissue]
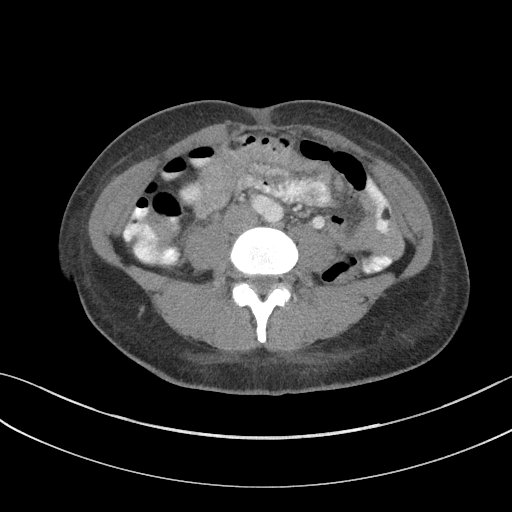
[im 51/96  soft-tissue]
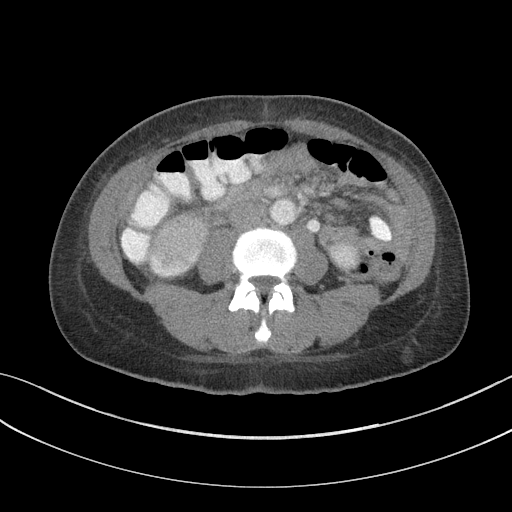
[im 61/96  soft-tissue]
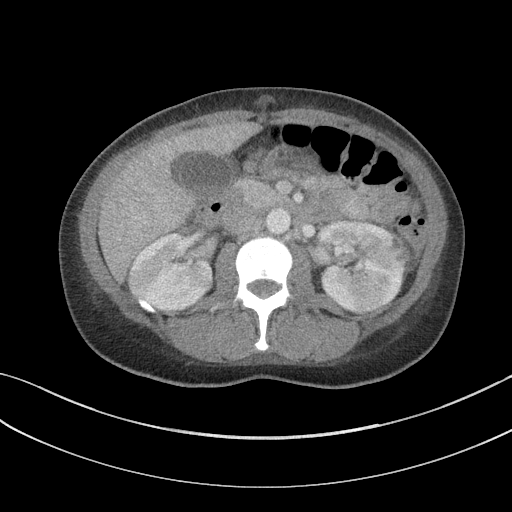
[im 66/96  soft-tissue]
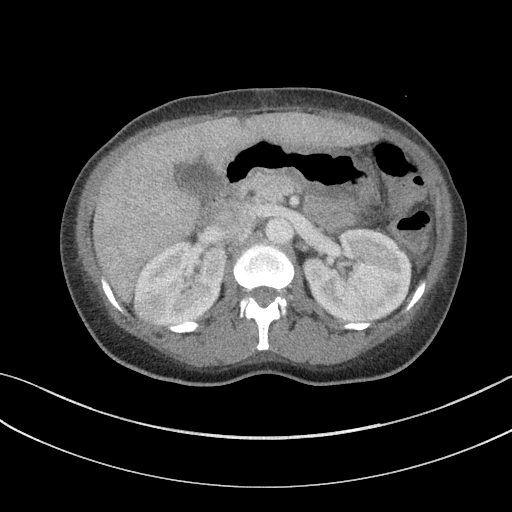
[im 66/96  bone]
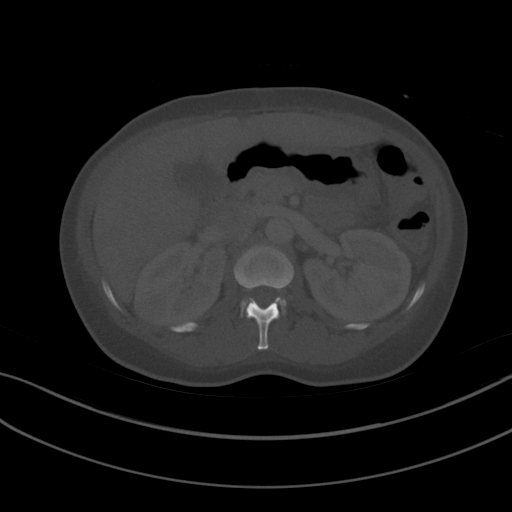
[im 76/96  soft-tissue]
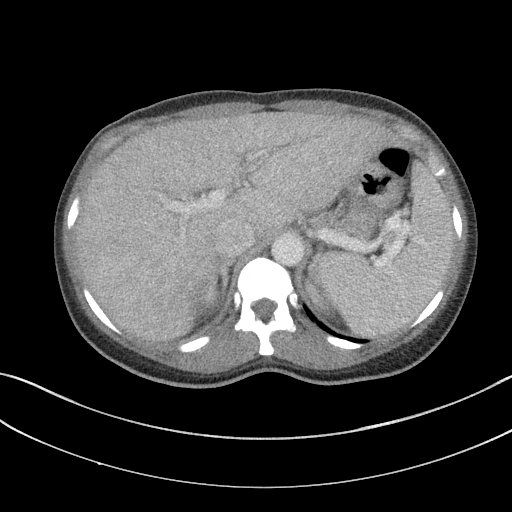
[im 81/96  soft-tissue]
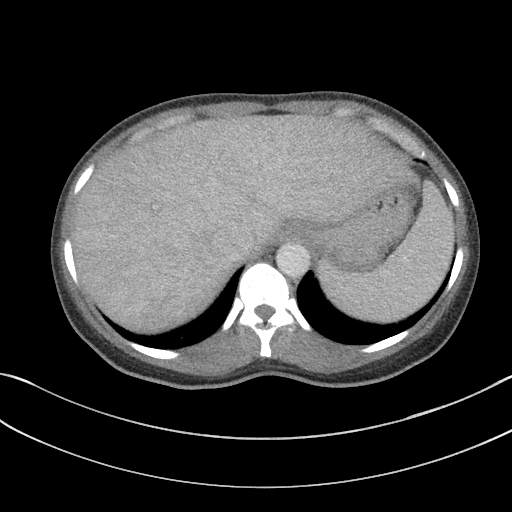
[im 91/96  soft-tissue]
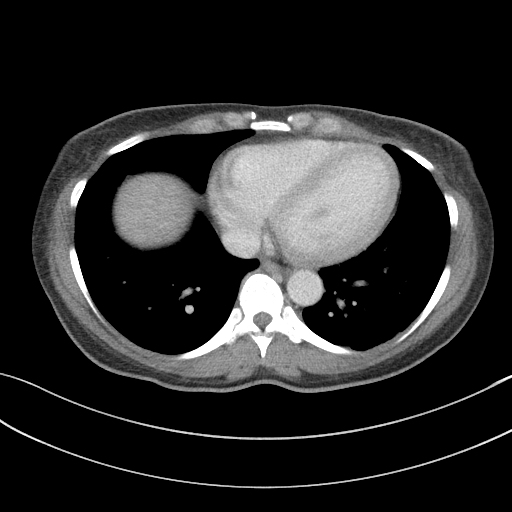

[Series 6: a/p w/ cor · coronal · 0.69mm/px · 3 of 129 slices shown]
[im 43/129  soft-tissue]
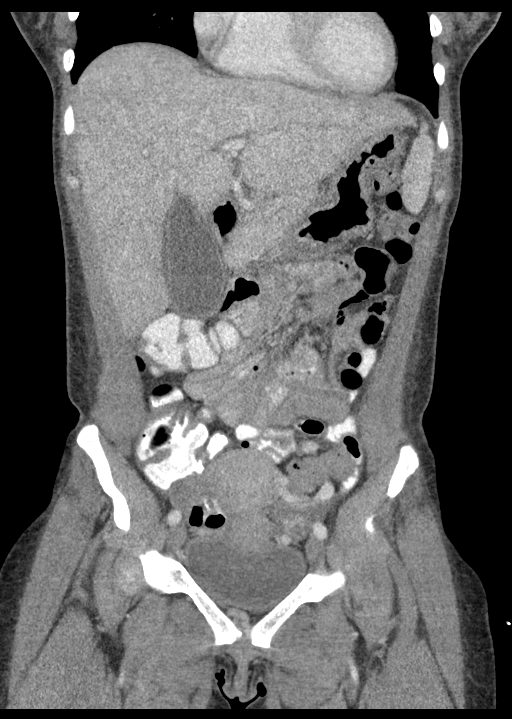
[im 57/129  soft-tissue]
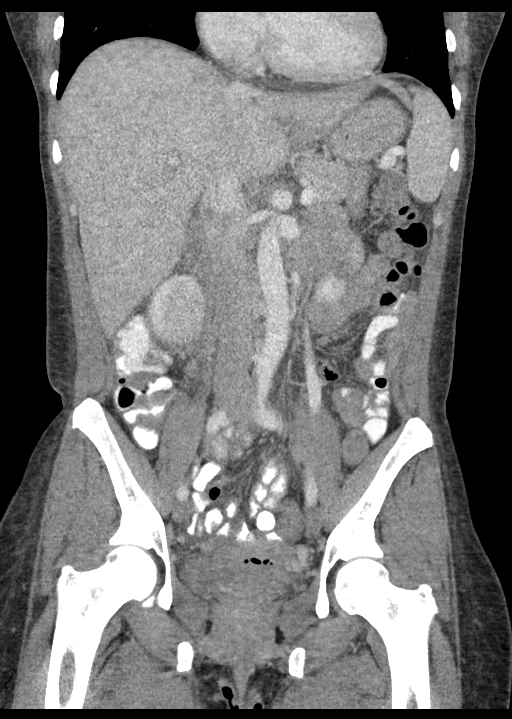
[im 72/129  soft-tissue]
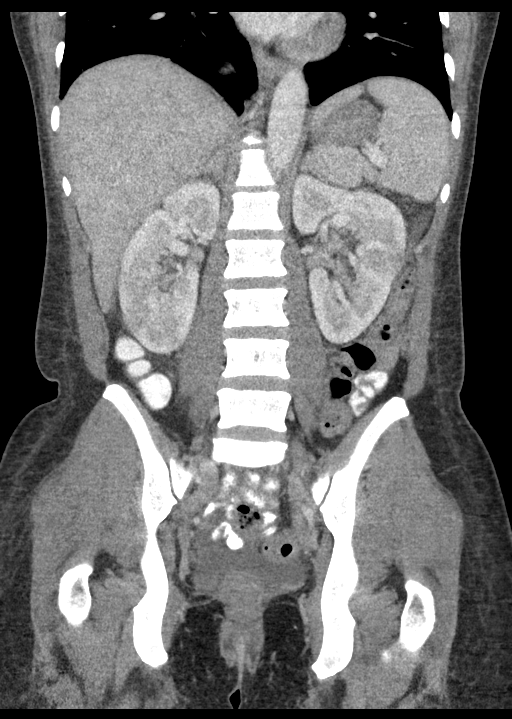

[15 of 46 positions shown; findings below may reference images not displayed]

FINDINGS: Lower chest:  Unremarkable.

Hepatobiliary: No focal abnormality within the liver parenchyma.
Mild periportal edema evident. Gallbladder is distended with
layering sludge or non mineralized tiny stones. There is
pericholecystic fluid. No intra or extrahepatic biliary duct
dilatation.

Pancreas: No focal mass lesion. No dilatation of the main duct. No
intraparenchymal cyst. No peripancreatic edema.

Spleen: No splenomegaly. No focal mass lesion.

Adrenals/Urinary Tract: No adrenal nodule or mass. Multiple areas of
segmental hypoperfusion are identified in the kidneys bilaterally.
Interpolar left kidney demonstrates a 1.3 x 1.7 cm area of irregular
fluid density (image 20 series 8). Hypoperfusion with edema is
fairly marked in the inferior pole of the right kidney. No evidence
for hydroureteronephrosis. Bladder is nondistended

Stomach/Bowel: Stomach is nondistended. No gastric wall thickening.
No evidence of outlet obstruction. Duodenum is normally positioned
as is the ligament of Treitz. No small bowel wall thickening. No
small bowel dilatation. The terminal ileum is normal. The appendix
is normal. No gross colonic mass. No colonic wall thickening. No
substantial diverticular change.

Vascular/Lymphatic: No abdominal aortic aneurysm. No abdominal
aortic atherosclerotic calcification. There is no gastrohepatic or
hepatoduodenal ligament lymphadenopathy. No intraperitoneal or
retroperitoneal lymphadenopathy. No pelvic sidewall lymphadenopathy.

Reproductive: The uterus has normal CT imaging appearance. There is
no adnexal mass.

Other: Small volume free fluid in the cul-de-sac is similar to
prior.

Musculoskeletal: Tiny epigastric midline ventral hernia contains fat
and a trace amount of fluid. Gas in the subcutaneous fat of the
lower left anterior abdominal wall is likely due to an injection
site. The Bone windows reveal no worrisome lytic or sclerotic
osseous lesions.
IMPRESSION: 1. Interval evolution of bilateral pyelonephritis with 1.3 x 1.7 cm
intrarenal phlegmon or abscess in the interpolar left kidney. Areas
of segmental edema/ hypoperfusion in both kidneys appear progressive
concerning for worsening condition.
2. Mild periportal edema in the liver with some fluid around the
gallbladder fossa. Small volume fluid in the cul-de-sac is similar
to prior.
3. Layering sludge or non mineralized stones in the gallbladder.

## 2017-08-05 MED ORDER — IOPAMIDOL (ISOVUE-300) INJECTION 61%
INTRAVENOUS | Status: AC
  Administered 2017-08-05: 100 mL via INTRAVENOUS
  Filled 2017-08-05: qty 100

## 2017-08-05 MED ORDER — IOPAMIDOL (ISOVUE-300) INJECTION 61%: 15.0000 mL | INTRAVENOUS | Status: AC

## 2017-08-05 MED ORDER — POTASSIUM CHLORIDE CRYS ER 20 MEQ PO TBCR
40.0000 meq | EXTENDED_RELEASE_TABLET | Freq: Once | ORAL | Status: AC
  Administered 2017-08-05: 40 meq via ORAL
  Filled 2017-08-05: qty 2

## 2017-08-05 MED ORDER — ENOXAPARIN SODIUM 40 MG/0.4ML ~~LOC~~ SOLN
40.0000 mg | Freq: Every day | SUBCUTANEOUS | Status: DC
  Administered 2017-08-06 – 2017-08-08 (×3): 40 mg via SUBCUTANEOUS
  Filled 2017-08-05 (×3): qty 0.4

## 2017-08-05 MED ORDER — PANTOPRAZOLE SODIUM 40 MG PO TBEC
40.0000 mg | DELAYED_RELEASE_TABLET | Freq: Every day | ORAL | Status: DC
  Administered 2017-08-05 – 2017-08-08 (×4): 40 mg via ORAL
  Filled 2017-08-05 (×4): qty 1

## 2017-08-05 MED ORDER — ENSURE ENLIVE PO LIQD
237.0000 mL | Freq: Two times a day (BID) | ORAL | Status: DC
  Administered 2017-08-05: 237 mL via ORAL

## 2017-08-05 NOTE — Progress Notes (Signed)
[facility] Progress Note     Subjective: CC:  Patient reports her pain is somewhat improved but still having significant right flank pain. Abdominal pain described as gas pain/rumbling. Endorses nausea, dysuria, urinary frequency. According to the patient she was not offered ensure yesterday in nuc med, states she had actually been looking forward to drinking it because she had been NPO but it was never offered.  Reports takes omeprazole at home for subjective history of gastric ulcer managed non-op 1 year ago. At this time she had "a lot going on" and was anemic 2/2 abnormal uterine bleeding requiring uterine ablation so she reports that she does not remember all the details of her GI history.  Objective: Vital signs in last 24 hours: Temp:  [98.5 F (36.9 C)-102.9 F (39.4 C)] 98.5 F (36.9 C) (11/26 0500) Pulse Rate:  [65-101] 65 (11/26 0500) Resp:  [15-20] 20 (11/26 0500) BP: (126-169)/(84-93) 157/91 (11/26 0500) SpO2:  [100 %] 100 % (11/26 0500) Last BM Date: 08/04/17  Intake/Output from previous day: 11/25 0701 - 11/26 0700 In: 2413.7 [P.O.:120; I.V.:2193.7; IV Piggyback:100] Out: -  Intake/Output this shift: No intake/output data recorded.  PE: Gen:  Alert, NAD, cooperative, appears uncomfortable. HEENT: pupils equals and round, EOMs in tact, anicteric sclerae  Card:  Regular rate and rhythm, pedal pulses 2+BL Pulm:  Normal effort, clear to auscultation bilaterally Abd: Soft, TTP LUQ, RUQ, and RLQ with guarding; maximum point of tenderness is over left and right flank/CVA.  Skin: warm and dry, no rashes  Psych: A&Ox3   Lab Results:  Recent Labs    08/04/17 0719 08/05/17 0625  WBC 12.6* 6.1  HGB 10.3* 9.0*  HCT 30.2* 26.9*  PLT 332 351   BMET Recent Labs    08/04/17 0719 08/05/17 0625  NA 139 137  K 3.9 3.2*  CL 115* 114*  CO2 15* 17*  GLUCOSE 91 99  BUN 8 5*  CREATININE 0.74 0.66  CALCIUM 8.1* 7.9*   PT/INR Recent Labs     08/02/17 1703  LABPROT 14.7  INR 1.16   CMP     Component Value Date/Time   NA 137 08/05/2017 0625   K 3.2 (L) 08/05/2017 0625   CL 114 (H) 08/05/2017 0625   CO2 17 (L) 08/05/2017 0625   GLUCOSE 99 08/05/2017 0625   BUN 5 (L) 08/05/2017 0625   CREATININE 0.66 08/05/2017 0625   CALCIUM 7.9 (L) 08/05/2017 0625   PROT 5.4 (L) 08/04/2017 1006   ALBUMIN 2.3 (L) 08/04/2017 1006   AST 22 08/04/2017 1006   ALT 24 08/04/2017 1006   ALKPHOS 85 08/04/2017 1006   BILITOT 0.5 08/04/2017 1006   GFRNONAA >60 08/05/2017 0625   GFRAA >60 08/05/2017 0625   Lipase     Component Value Date/Time   LIPASE 24 08/02/2017 1740       Studies/Results: Nm Hepatobiliary Liver Func  Result Date: 08/04/2017 CLINICAL DATA:  Gallbladder wall thickening and gallbladder sludge seen on ultrasound August 03, 2017 assess for acute cholecystitis EXAM: NUCLEAR MEDICINE HEPATOBILIARY IMAGING TECHNIQUE: Sequential images of the abdomen were obtained out to 60 minutes following intravenous administration of radiopharmaceutical. RADIOPHARMACEUTICALS:  5.2 mCi Tc-2785m  Choletec IV COMPARISON:  CT scan August 02, 2017 and ultrasound August 03, 2017 FINDINGS: There is prompt uptake of radiotracer in the liver with prompt excretion into the small bowel and reflux into the stomach. No filling of the gallbladder by the end of the first hour of imaging. The patient  refused to continue the study after the first hour and morphine was not utilized IMPRESSION: 1. The gallbladder did not fill within the first hour of imaging. The patient refused to continue the study and morphine was not utilized. As a result, the findings are nonspecific. Acute cholecystitis is not excluded or confirmed on this incomplete study. Electronically Signed   By: Gerome Samavid  Williams III M.D   On: 08/04/2017 20:38   [place] Abdomen Limited Ruq  Result Date: 08/03/2017 CLINICAL DATA:  Patient with abdominal pain and vomiting. EXAM: ULTRASOUND ABDOMEN  LIMITED RIGHT UPPER QUADRANT COMPARISON:  CT abdomen 08/02/2017 FINDINGS: Gallbladder: Sludge within the gallbladder lumen. Mild gallbladder wall thickening. Negative sonographic Murphy's sign. No significant pericholecystic fluid. Common bile duct: Diameter: 4 mm Liver: No focal lesion identified. Within normal limits in parenchymal echogenicity. Portal vein is patent on color Doppler imaging with normal direction of blood flow towards the liver. IMPRESSION: Sludge within the gallbladder lumen. Gallbladder wall thickening. Findings are indeterminate for acute cholecystitis. Consider further evaluation with HIDA scan as clinically indicated. Electronically Signed   By: Annia Beltrew  Davis M.D.   On: 08/03/2017 19:34    Anti-infectives: Anti-infectives (From admission, onward)   Start     Dose/Rate Route Frequency Ordered Stop   08/04/17 1430  piperacillin-tazobactam (ZOSYN) IVPB 3.375 g     3.375 g 12.5 mL/hr over 240 Minutes Intravenous Every 8 hours 08/04/17 1410     08/04/17 1100  cefTRIAXone (ROCEPHIN) 2 g in dextrose 5 % 50 mL IVPB  Status:  Discontinued     2 g 100 mL/hr over 30 Minutes Intravenous Every 24 hours 08/03/17 1609 08/04/17 1339   08/03/17 1615  cefTRIAXone (ROCEPHIN) 1 g in dextrose 5 % 50 mL IVPB     1 g 100 mL/hr over 30 Minutes Intravenous  Once 08/03/17 1610 08/03/17 1845   08/03/17 1115  cefTRIAXone (ROCEPHIN) 1 g in dextrose 5 % 50 mL IVPB     1 g 100 mL/hr over 30 Minutes Intravenous  Once 08/03/17 1112 08/03/17 1238     Assessment/Plan Bacteremia - E coli, enterobacter  Bilateral pyelonephritis with 1.6 cm left renal abscess - per urology. abx 6 weeks. Metabolic acidosis HTN Normocytic anemia  Tobacco use  Abdominal pain and abnormal RUQ U/S  - leukocytosis resolved on IV abx, afebrile as of 1541 yesterday, LFT's WNL.   - no stones, just sludge, visible on CT or RUQ U/S. Mild GB wall thickening - HIDA inconclusive as study not continued after 1 hr. - patient  symptoms are more consistent with her known pyelonephritis. Additionally, her abdominal complaints (burning epigastric pain) are more consistent with GERD/PUD. At this point I recommend continued treatment of pyelonephritis with a low suspicion for cholecystitis. start PPI for GERD. Should her symptoms not resolve with continued treatment of pyelo and reflex, we would consider working up patients gallbladder further. General surgery will sign off. Call with questions/concerns.   FEN: clear liquids, advance to low fat diet as tolerated; consider ensure between meals due to poor oral intake. ID: Ceftriaxone 11/24 >>  VTE: SCD's, Lovenox Foley: none    LOS: 1 day    Adam PhenixElizabeth S Jenean Escandon , [facility] 08/05/2017, 8:46 AM Pager: [phone] Consults: [phone] Mon-Fri 7:00 am-4:30 pm Sat-Sun 7:00 am-11:30 am

## 2017-08-05 NOTE — Progress Notes (Signed)
PROGRESS NOTE    Shirley AlleyLinda A [name]  WGN:[id number]:[id number] DOB: 25-Oct-1975 DOA: 08/03/2017 PCP: [facility]    Brief Narrative: Shirley AlleyLinda A [name] is a 41 y.o. female with medical history significant of hypertension, recurrent urinary tract infections, GERD, anemia presents to the emergency Department chief complaint persistent nausea vomiting flank pain. Initial evaluation reveals fever leukocytosis urinalysis from yesterday consistent with UTI and positive blood cultures from yesterday. [facility] asked to admit     Assessment & Plan:   Principal Problem:   Bacteremia Active Problems:   Essential hypertension   Anemia   Pyelonephritis   Hypokalemia   Tobacco use   Bacteremia; E coli, enterobacter;  Blood culture 11-23 positive for E coli, enterobacter.  Repeated blood cultures 11-24 no growth to date.  IV zosyn.  Follow sensitivity.   Pyelonephritis; ? Kidney abscess;  CT scan 11-23; consistent with pyelonephritis. Possible developing 1.6 cm abscess in the left kidney.  -WBC decreased from 20---12.  -. Follow culture.  -Urology will reviewed CT scan and will give us recommendation regarding abscess.  Discussed with DR wagner, he reviewed last CT scan, probably phlegmon, no big enough to be drain. Plan to repeat CT scan in 24 hours. Also need results of HIDA scan.  Antibiotics change to zosyn, due to persistent fevers.  Evaluated by  Abnormal RUQ US; Sludge gallbladder.  Gallbladder thickening. Finding are indeterminate for acute cholecystitis.  LFT normal.  HIDA scan; inconclusive.  Surgery recommending treatment for pyelo, and GERD. If pain does not improved will need further work up of gallbladder.   Metabolic acidosis;  Continue with IV fluids.   IV antibiotics.  Improved.   Hypokalemia ; resolved.    HTN; continue with clonidine with holder parameters.   Anemia; iron deficiency;  Hb stable.  Start oral iron when infection is  controlled.   Tabbacco use; counseling provide.    Hypokalemia; replete orally.   History of colitis 03-2017. Treated with cipro and flagyl.   DVT prophylaxis: Lovenox Code Status: full code.  Family Communication: care discussed with patient.  Disposition Plan: remain in patient.    Consultants:   Surgery  Urology    Procedures:  RUQ US; Sludge within the gallbladder lumen. Gallbladder wall thickening. Findings are indeterminate for acute cholecystitis. Consider further  evaluation with HIDA scan as clinically indicated.  Antimicrobials:  Ceftriaxone 11-24   Subjective: Still complaining of right side flank abdominal pain.  Dysuria.    Objective: Vitals:   08/04/17 1250 08/04/17 1541 08/04/17 2114 08/05/17 0500  BP: (!) 145/89 129/88 (!) 169/93 (!) 157/91  Pulse: (!) 101 80 79 65  Resp: 15 18 18 20   Temp: (!) 102.9 F (39.4 C) (!) 101.3 F (38.5 C) 99.3 F (37.4 C) 98.5 F (36.9 C)  TempSrc: Oral Oral Oral Oral  SpO2: 100%  100% 100%  Weight:      Height:        Intake/Output Summary (Last 24 hours) at 08/05/2017 1023 Last data filed at 08/05/2017 0310 Gross per 24 hour  Intake 2413.74 ml  Output -  Net 2413.74 ml   Filed Weights   08/03/17 1742  Weight: 56.7 kg (125 lb)    Examination:  General exam:NAD Respiratory system: CTA Cardiovascular system: S 1, S 2 RRR Gastrointestinal system: Soft, very tender right side abdomen, no rigidity   Central nervous system: non focal.  Extremities: Symmetric power.  Skin: No rash      Data Reviewed: I  have personally reviewed following labs and imaging studies  CBC: Recent Labs  Lab 08/02/17 1703 08/03/17 1109 08/04/17 0719 08/05/17 0625  WBC 19.7* 20.1* 12.6* 6.1  NEUTROABS 17.9* 18.5*  --   --   HGB 11.5* 10.7* 10.3* 9.0*  HCT 33.5* 30.8* 30.2* 26.9*  MCV 93.1 91.9 92.6 92.1  PLT 326 345 332 351   Basic Metabolic Panel: Recent Labs  Lab 08/02/17 1703 08/03/17 1109 08/03/17 1158  08/04/17 0719 08/05/17 0625  NA 134* 138  --  139 137  K 3.4* 2.9*  --  3.9 3.2*  CL 104 109  --  115* 114*  CO2 18* 18*  --  15* 17*  GLUCOSE 104* 125*  --  91 99  BUN 9 9  --  8 5*  CREATININE 1.01* 0.89  --  0.74 0.66  CALCIUM 8.8* 8.6*  --  8.1* 7.9*  MG  --   --  2.2  --   --    GFR: Estimated Creatinine Clearance: 73.9 mL/min (by C-G formula based on SCr of 0.66 mg/dL). Liver Function Tests: Recent Labs  Lab 08/02/17 1703 08/03/17 1109 08/04/17 1006  AST 28 20 22   ALT 34 28 24  ALKPHOS 115 111 85  BILITOT 1.0 0.6 0.5  PROT 7.4 7.0 5.4*  ALBUMIN 3.4* 3.1* 2.3*   Recent Labs  Lab 08/02/17 1740  LIPASE 24   No results for input(s): AMMONIA in the last 168 hours. Coagulation Profile: Recent Labs  Lab 08/02/17 1703  INR 1.16   Cardiac Enzymes: No results for input(s): CKTOTAL, CKMB, CKMBINDEX, TROPONINI in the last 168 hours. BNP (last 3 results) No results for input(s): PROBNP in the last 8760 hours. HbA1C: No results for input(s): HGBA1C in the last 72 hours. CBG: No results for input(s): GLUCAP in the last 168 hours. Lipid Profile: No results for input(s): CHOL, HDL, LDLCALC, TRIG, CHOLHDL, LDLDIRECT in the last 72 hours. Thyroid Function Tests: No results for input(s): TSH, T4TOTAL, FREET4, T3FREE, THYROIDAB in the last 72 hours. Anemia Panel: Recent Labs    08/03/17 2018 08/03/17 2019  VITAMINB12  --  1,939*  FOLATE 14.3  --   FERRITIN  --  76  TIBC  --  185*  IRON  --  10*  RETICCTPCT  --  0.4   Sepsis Labs: Recent Labs  Lab 08/02/17 1724 08/02/17 1832 08/03/17 1601  LATICACIDVEN 0.97 1.70 1.49    Recent Results (from the past 240 hour(s))  Culture, blood (Routine x 2)     Status: Abnormal (Preliminary result)   Collection Time: 08/02/17  5:03 PM  Result Value Ref Range Status   Specimen Description BLOOD RIGHT ANTECUBITAL  Final   Special Requests   Final    BOTTLES DRAWN AEROBIC AND ANAEROBIC Blood Culture adequate volume    Culture  Setup Time   Final    GRAM NEGATIVE RODS IN BOTH AEROBIC AND ANAEROBIC BOTTLES CRITICAL RESULT CALLED TO, READ BACK BY AND VERIFIED WITH: S GOUGE,RN AT [id number] 08/03/17 BY L BENFIELD    Culture ESCHERICHIA COLI SUSCEPTIBILITIES TO FOLLOW  (A)  Final   Report Status PENDING  Incomplete  Blood Culture ID Panel (Reflexed)     Status: Abnormal   Collection Time: 08/02/17  5:03 PM  Result Value Ref Range Status   Enterococcus species NOT DETECTED NOT DETECTED Final   Listeria monocytogenes NOT DETECTED NOT DETECTED Final   Staphylococcus species NOT DETECTED NOT DETECTED Final   Staphylococcus aureus NOT  DETECTED NOT DETECTED Final   Streptococcus species NOT DETECTED NOT DETECTED Final   Streptococcus agalactiae NOT DETECTED NOT DETECTED Final   Streptococcus pneumoniae NOT DETECTED NOT DETECTED Final   Streptococcus pyogenes NOT DETECTED NOT DETECTED Final   Acinetobacter baumannii NOT DETECTED NOT DETECTED Final   Enterobacteriaceae species DETECTED (A) NOT DETECTED Final    Comment: Enterobacteriaceae represent a large family of gram-negative bacteria, not a single organism. CRITICAL RESULT CALLED TO, READ BACK BY AND VERIFIED WITH: S GOUGE,RN AT 1610 08/03/17 BY L BENFIELD    Enterobacter cloacae complex NOT DETECTED NOT DETECTED Final   Escherichia coli DETECTED (A) NOT DETECTED Final    Comment: CRITICAL RESULT CALLED TO, READ BACK BY AND VERIFIED WITH: S GOUGE,RN AT 9604 08/03/17 BY L BENFIELD    Klebsiella oxytoca NOT DETECTED NOT DETECTED Final   Klebsiella pneumoniae NOT DETECTED NOT DETECTED Final   Proteus species NOT DETECTED NOT DETECTED Final   Serratia marcescens NOT DETECTED NOT DETECTED Final   Carbapenem resistance NOT DETECTED NOT DETECTED Final   Haemophilus influenzae NOT DETECTED NOT DETECTED Final   Neisseria meningitidis NOT DETECTED NOT DETECTED Final   Pseudomonas aeruginosa NOT DETECTED NOT DETECTED Final   Candida albicans NOT DETECTED NOT  DETECTED Final   Candida glabrata NOT DETECTED NOT DETECTED Final   Candida krusei NOT DETECTED NOT DETECTED Final   Candida parapsilosis NOT DETECTED NOT DETECTED Final   Candida tropicalis NOT DETECTED NOT DETECTED Final  Culture, blood (Routine x 2)     Status: None (Preliminary result)   Collection Time: 08/02/17  5:46 PM  Result Value Ref Range Status   Specimen Description BLOOD RIGHT ANTECUBITAL  Final   Special Requests   Final    BOTTLES DRAWN AEROBIC AND ANAEROBIC Blood Culture adequate volume   Culture  Setup Time   Final    GRAM NEGATIVE RODS AEROBIC BOTTLE ONLY CRITICAL RESULT CALLED TO, READ BACK BY AND VERIFIED WITH: S GOUGE,RN AT 1004 08/03/17 BY L BENFIELD    Culture GRAM NEGATIVE RODS  Final   Report Status PENDING  Incomplete  Urine culture     Status: Abnormal   Collection Time: 08/02/17  8:08 PM  Result Value Ref Range Status   Specimen Description URINE, RANDOM  Final   Special Requests URINE, CLEAN CATCH  Final   Culture MULTIPLE SPECIES PRESENT, SUGGEST RECOLLECTION (A)  Final   Report Status 08/04/2017 FINAL  Final  Blood culture (routine x 2)     Status: None (Preliminary result)   Collection Time: 08/03/17  3:36 PM  Result Value Ref Range Status   Specimen Description BLOOD RIGHT ANTECUBITAL  Final   Special Requests   Final    BOTTLES DRAWN AEROBIC AND ANAEROBIC Blood Culture adequate volume   Culture NO GROWTH < 24 HOURS  Final   Report Status PENDING  Incomplete  Blood culture (routine x 2)     Status: None (Preliminary result)   Collection Time: 08/03/17  3:38 PM  Result Value Ref Range Status   Specimen Description BLOOD LEFT HAND  Final   Special Requests   Final    BOTTLES DRAWN AEROBIC AND ANAEROBIC Blood Culture adequate volume   Culture NO GROWTH < 24 HOURS  Final   Report Status PENDING  Incomplete         Radiology Studies: Nm Hepatobiliary Liver Func  Result Date: 08/04/2017 CLINICAL DATA:  Gallbladder wall thickening and  gallbladder sludge seen on ultrasound August 03, 2017  assess for acute cholecystitis EXAM: NUCLEAR MEDICINE HEPATOBILIARY IMAGING TECHNIQUE: Sequential images of the abdomen were obtained out to 60 minutes following intravenous administration of radiopharmaceutical. RADIOPHARMACEUTICALS:  5.2 mCi Tc-38m  Choletec IV COMPARISON:  CT scan August 02, 2017 and ultrasound August 03, 2017 FINDINGS: There is prompt uptake of radiotracer in the liver with prompt excretion into the small bowel and reflux into the stomach. No filling of the gallbladder by the end of the first hour of imaging. The patient refused to continue the study after the first hour and morphine was not utilized IMPRESSION: 1. The gallbladder did not fill within the first hour of imaging. The patient refused to continue the study and morphine was not utilized. As a result, the findings are nonspecific. Acute cholecystitis is not excluded or confirmed on this incomplete study. Electronically Signed   By: Gerome Sam III M.D   On: 08/04/2017 20:38   [place] Abdomen Limited Ruq  Result Date: 08/03/2017 CLINICAL DATA:  Patient with abdominal pain and vomiting. EXAM: ULTRASOUND ABDOMEN LIMITED RIGHT UPPER QUADRANT COMPARISON:  CT abdomen 08/02/2017 FINDINGS: Gallbladder: Sludge within the gallbladder lumen. Mild gallbladder wall thickening. Negative sonographic Murphy's sign. No significant pericholecystic fluid. Common bile duct: Diameter: 4 mm Liver: No focal lesion identified. Within normal limits in parenchymal echogenicity. Portal vein is patent on color Doppler imaging with normal direction of blood flow towards the liver. IMPRESSION: Sludge within the gallbladder lumen. Gallbladder wall thickening. Findings are indeterminate for acute cholecystitis. Consider further evaluation with HIDA scan as clinically indicated. Electronically Signed   By: Annia Belt M.D.   On: 08/03/2017 19:34        Scheduled Meds: . cloNIDine  0.2 mg Oral BID    . [START ON 08/06/2017] enoxaparin (LOVENOX) injection  40 mg Subcutaneous Daily  . feeding supplement (ENSURE ENLIVE)  237 mL Oral BID BM  . pantoprazole  40 mg Oral Daily  . potassium chloride  40 mEq Oral Once   Continuous Infusions: . sodium chloride 125 mL/hr at 08/05/17 0310  . piperacillin-tazobactam (ZOSYN)  IV 3.375 g (08/05/17 0501)     LOS: 1 day    Time spent: 35 minutes.     Alba Cory, MD [facility] Pager [phone]  If 7PM-7AM, please contact night-coverage [email or web address] Password [facility] 08/05/2017, 10:23 AM

## 2017-08-06 ENCOUNTER — Encounter (HOSPITAL_COMMUNITY): Payer: Self-pay | Admitting: Radiology

## 2017-08-06 ENCOUNTER — Inpatient Hospital Stay (HOSPITAL_COMMUNITY): Payer: BLUE CROSS/BLUE SHIELD

## 2017-08-06 ENCOUNTER — Other Ambulatory Visit: Payer: Self-pay

## 2017-08-06 LAB — BASIC METABOLIC PANEL
Anion gap: 9 (ref 5–15)
BUN: <5 mg/dL — ABNORMAL LOW (ref 6–20)
CO2: 17 mmol/L — ABNORMAL LOW (ref 22–32)
Calcium: 8.1 mg/dL — ABNORMAL LOW (ref 8.9–10.3)
Chloride: 112 mmol/L — ABNORMAL HIGH (ref 101–111)
Creatinine, Ser: 0.48 mg/dL (ref 0.44–1.00)
GFR calc Af Amer: >60 mL/min (ref >60)
GFR calc non Af Amer: >60 mL/min (ref >60)
Glucose, Bld: 98 mg/dL (ref 65–99)
Potassium: 3.5 mmol/L (ref 3.5–5.1)
Sodium: 138 mmol/L (ref 135–145)

## 2017-08-06 LAB — CBC
HCT: 30.1 % — ABNORMAL LOW (ref 36.0–46.0)
Hemoglobin: 10.4 g/dL — ABNORMAL LOW (ref 12.0–15.0)
MCH: 31.6 pg (ref 26.0–34.0)
MCHC: 34.6 g/dL (ref 30.0–36.0)
MCV: 91.5 fL (ref 78.0–100.0)
Platelets: 380 10*3/uL (ref 150–400)
RBC: 3.29 MIL/uL — ABNORMAL LOW (ref 3.87–5.11)
RDW: 14.5 % (ref 11.5–15.5)
WBC: 5.7 10*3/uL (ref 4.0–10.5)

## 2017-08-06 IMAGING — CT CT IMAGE GUIDED DRAINAGE BY PERCUTANEOUS CATHETER
1 of 2 series · 14 of 32 positions shown, 19 images · non-contrast
Comparison: none

CLINICAL DATA: Left renal abscess

[Series 2: i-spiral 5.0 b40f · axial · 0.83mm/px · z∈[+1273,+1441]mm · 14 of 55 slices shown, 19 images]
[im 4/55  soft-tissue]
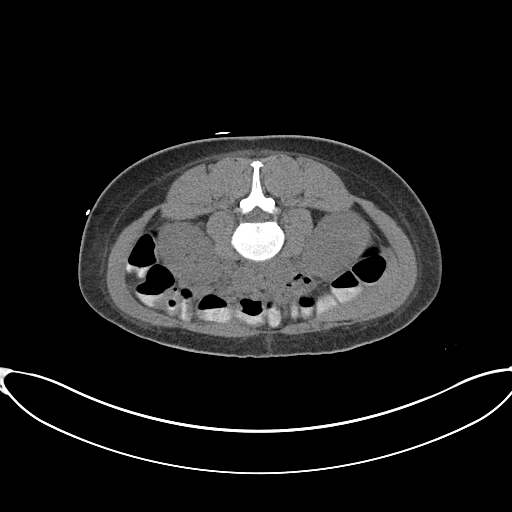
[im 4/55  bone]
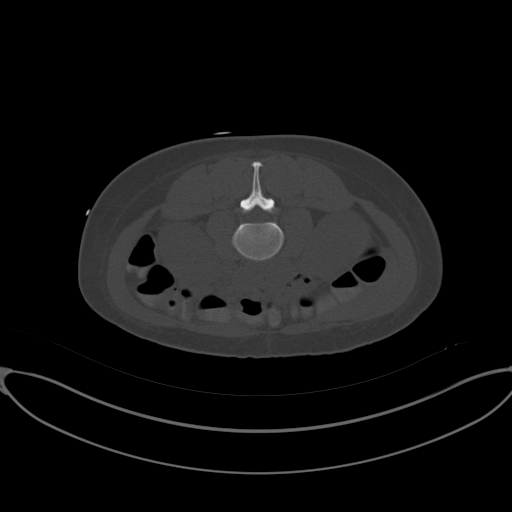
[im 7/55  soft-tissue]
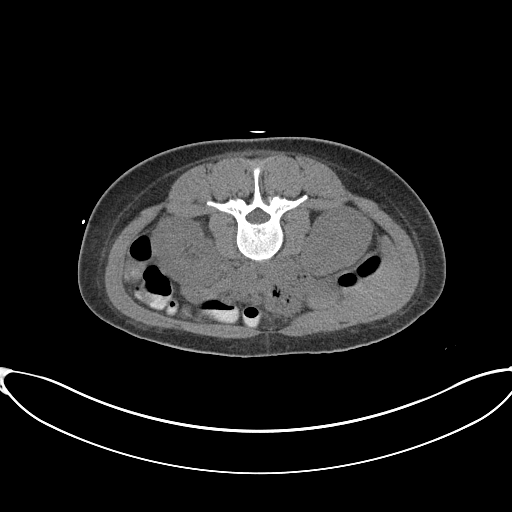
[im 13/55  soft-tissue]
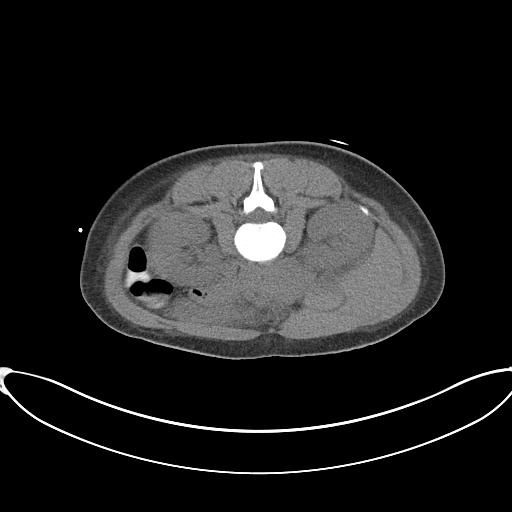
[im 16/55  soft-tissue]
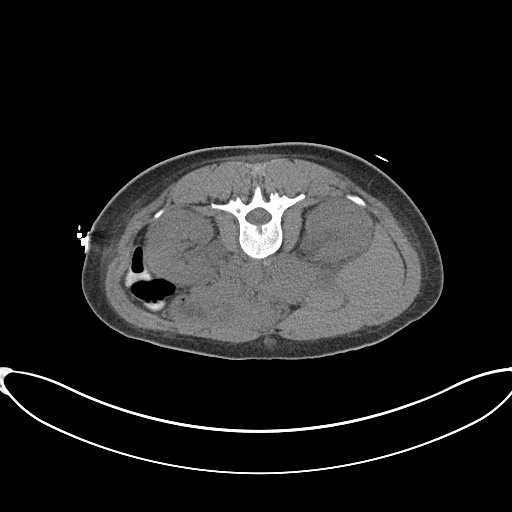
[im 19/55  soft-tissue]
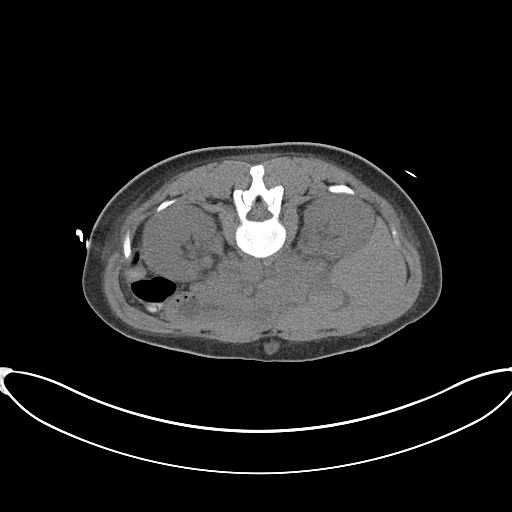
[im 25/55  soft-tissue]
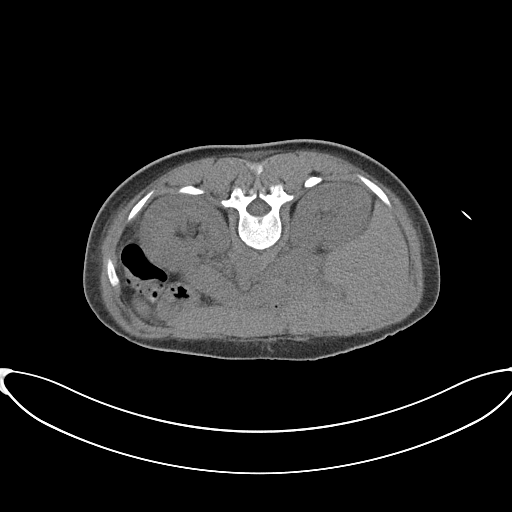
[im 28/55  soft-tissue]
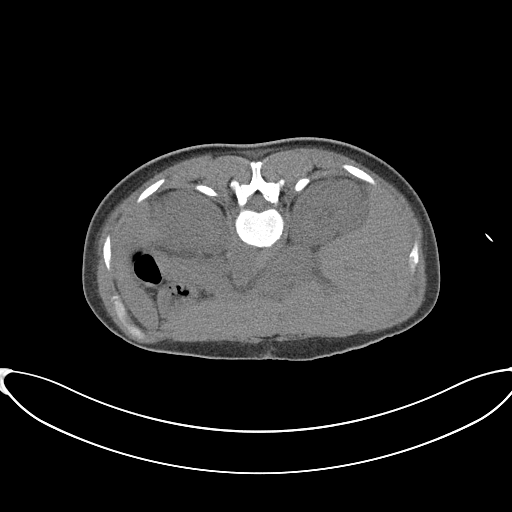
[im 31/55  soft-tissue]
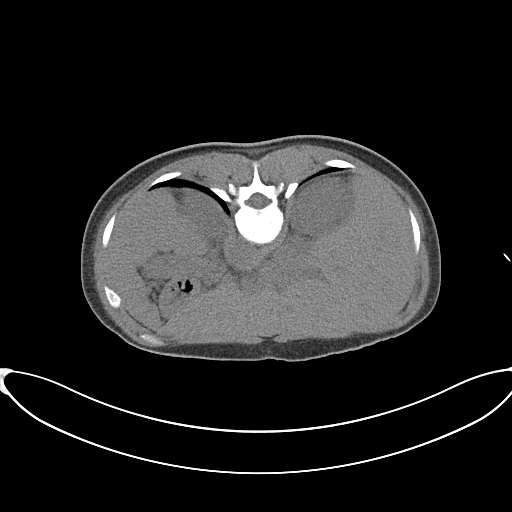
[im 37/55  soft-tissue]
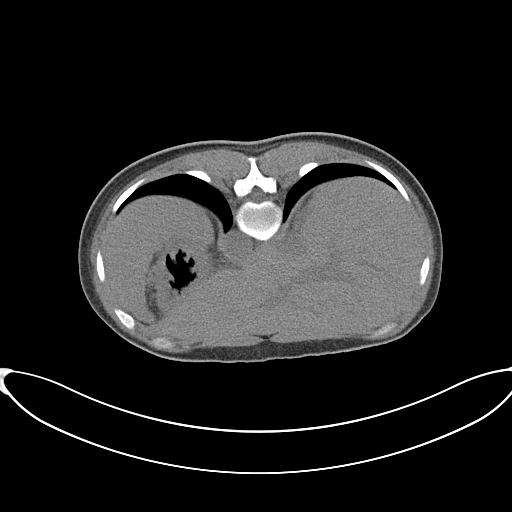
[im 37/55  bone]
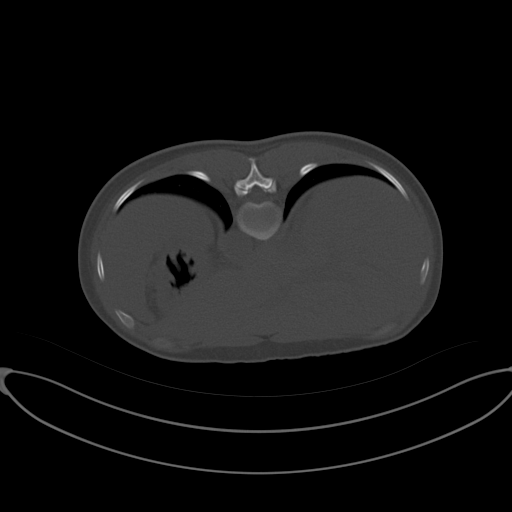
[im 40/55  soft-tissue]
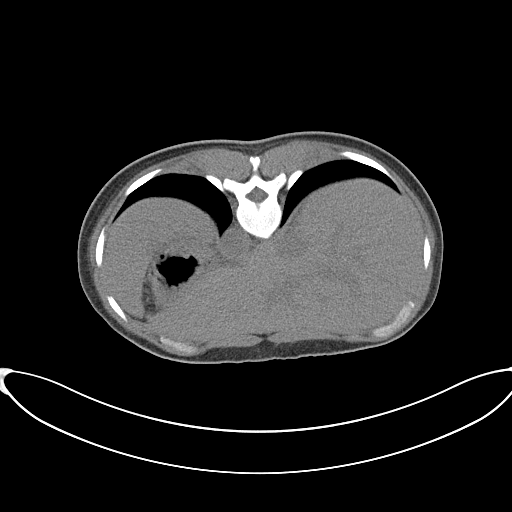
[im 43/55  soft-tissue]
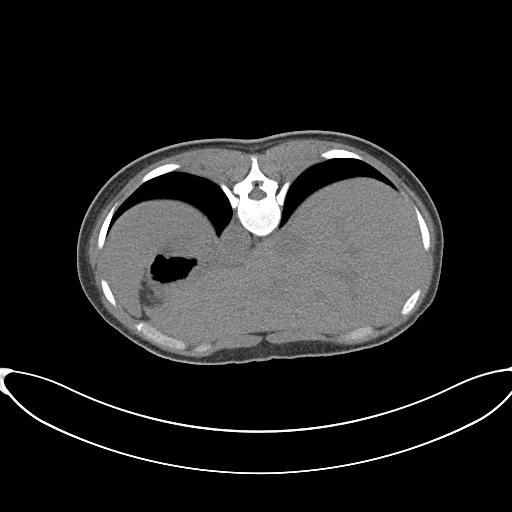
[im 43/55  lung]
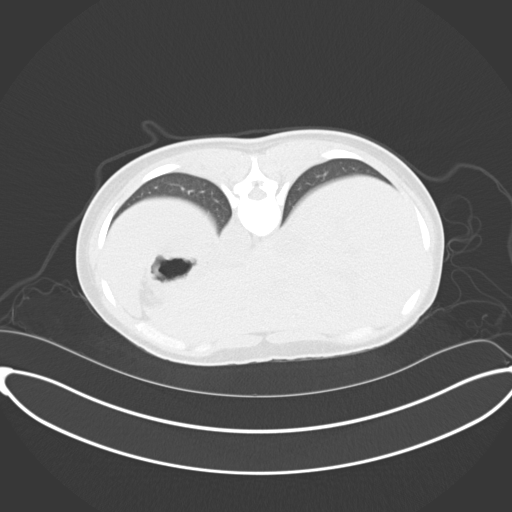
[im 46/55  lung]
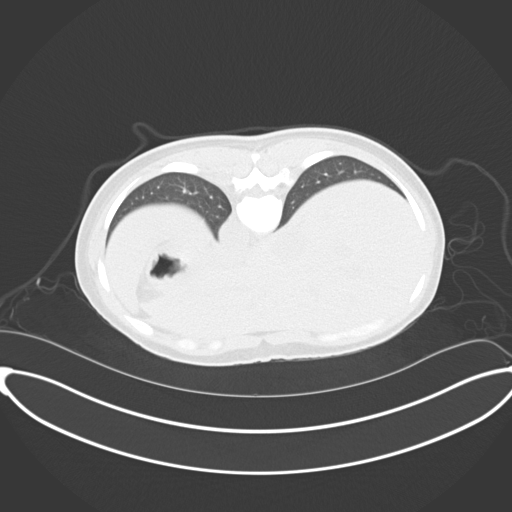
[im 49/55  soft-tissue]
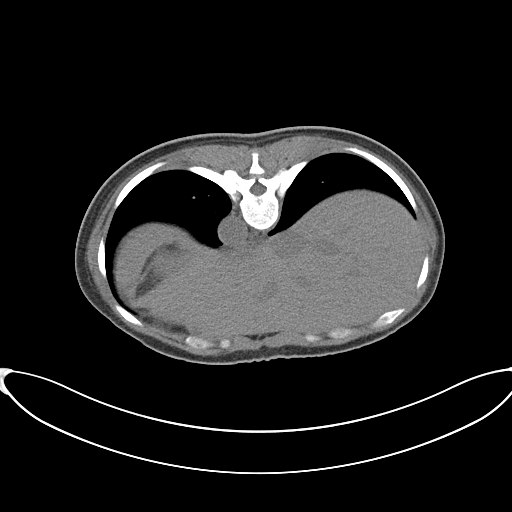
[im 49/55  lung]
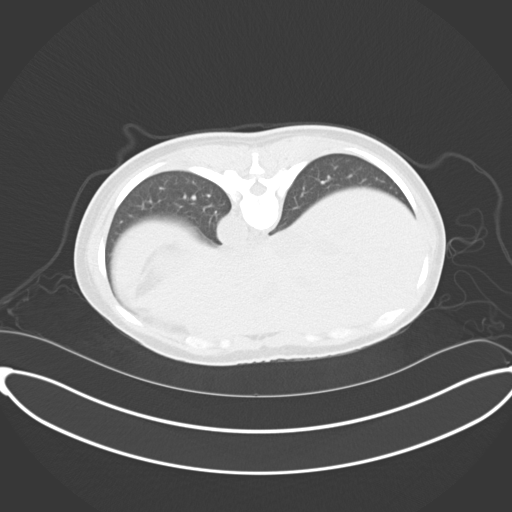
[im 52/55  soft-tissue]
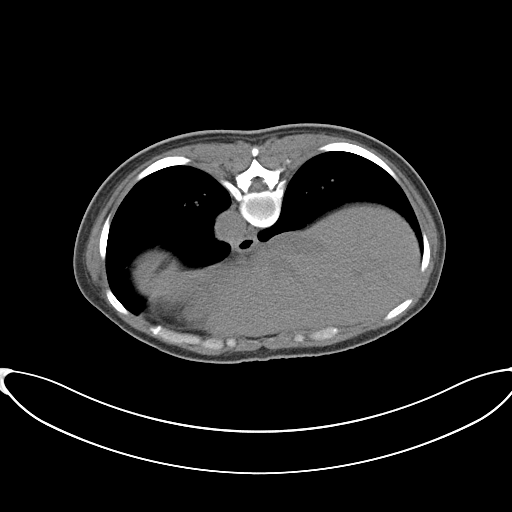
[im 52/55  lung]
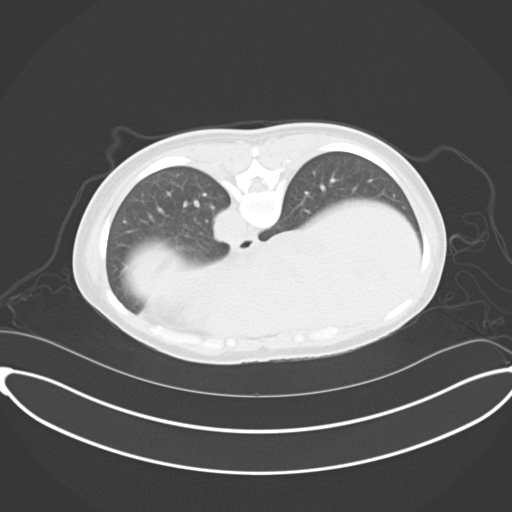

[14 of 32 positions shown; findings below may reference images not displayed]

EXAM:
CT IMAGE GUIDED DRAINAGE BY PERCUTANEOUS CATHETER

FLUOROSCOPY TIME:  None minutes

MEDICATIONS AND MEDICAL HISTORY:
Versed Three mg, Fentanyl 150 mcg.

Additional Medications: None.

ANESTHESIA/SEDATION:
Moderate sedation time: 13 minutes

CONTRAST:  None

PROCEDURE:
The procedure, risks, benefits, and alternatives were explained to
the patient. Questions regarding the procedure were encouraged and
answered. The patient understands and consents to the procedure.

The back in the prone position was prepped with ChloraPrep in a
sterile fashion, and a sterile drape was applied covering the
operative field. A sterile gown and sterile gloves were used for the
procedure.

Under CT guidance, an 18 gauge needle was inserted into the left
renal abscess in the interpolar region. Aspiration yielded 2 drops
bloody fluid. This was sent for culture
FINDINGS: Imaging documents needle placement in the ill-defined phlegmon
versus abscess within the left kidney.

COMPLICATIONS:
None
IMPRESSION: Successful aspiration of a left renal abscess yielding bloody fluid

## 2017-08-06 MED ORDER — MIDAZOLAM HCL 2 MG/2ML IJ SOLN
INTRAMUSCULAR | Status: AC | PRN
  Administered 2017-08-06 (×3): 1 mg via INTRAVENOUS

## 2017-08-06 MED ORDER — FENTANYL CITRATE (PF) 100 MCG/2ML IJ SOLN
INTRAMUSCULAR | Status: AC | PRN
  Administered 2017-08-06 (×3): 50 ug via INTRAVENOUS

## 2017-08-06 MED ORDER — FENTANYL CITRATE (PF) 100 MCG/2ML IJ SOLN
INTRAMUSCULAR | Status: AC
  Filled 2017-08-06: qty 2

## 2017-08-06 MED ORDER — MIDAZOLAM HCL 2 MG/2ML IJ SOLN
INTRAMUSCULAR | Status: AC
  Filled 2017-08-06: qty 2

## 2017-08-06 MED ORDER — DEXTROSE 5 % IV SOLN
2.0000 g | INTRAVENOUS | Status: DC
  Administered 2017-08-06 – 2017-08-07 (×2): 2 g via INTRAVENOUS
  Filled 2017-08-06 (×2): qty 2

## 2017-08-06 MED ORDER — SODIUM CHLORIDE 0.9 % IV SOLN
INTRAVENOUS | Status: AC | PRN
  Administered 2017-08-06: 10 mL/h via INTRAVENOUS

## 2017-08-06 MED ORDER — LIDOCAINE HCL 1 % IJ SOLN
INTRAMUSCULAR | Status: AC
  Filled 2017-08-06: qty 20

## 2017-08-06 NOTE — Sedation Documentation (Signed)
Patient is resting comfortably. 

## 2017-08-06 NOTE — Procedures (Signed)
L renal abscess aspiration Bloody fluid EBL 0 Comp 0

## 2017-08-06 NOTE — Progress Notes (Signed)
PROGRESS NOTE    Shirley [name]  ZOX:[id number] DOB: 04-Jan-1976 DOA: 08/03/2017 PCP: [facility]    Brief Narrative: Shirley [name] is a 41 y.o. female with medical history significant of hypertension, recurrent urinary tract infections, GERD, anemia presents to the emergency Department chief complaint persistent nausea vomiting flank pain. Initial evaluation reveals fever leukocytosis urinalysis from yesterday consistent with UTI and positive blood cultures from yesterday. [facility] asked to admit  Assessment & Plan:   Principal Problem:   Bacteremia Active Problems:   Essential hypertension   Anemia   Pyelonephritis   Hypokalemia   Tobacco use   Bacteremia; E coli, enterobacter;  Blood culture 11-23 positive for E coli, enterobacter.  Repeated blood cultures 11-24 no growth to date.  IV ceftriaxone. E coli pan sensitive   Pyelonephritis; left  Kidney abscess;  CT scan 11-23; consistent with pyelonephritis. Possible developing 1.6 cm abscess in the left kidney.  -WBC decreased from 20---12--5 -Urology recommend IR evaluation for abscess drainage. They also recommend 6 weeks of Antibiotics.  -Repeated CT with worsening pyelo and abscess. Plan for IR to drain abscess today.    Abnormal RUQ [place]; Sludge gallbladder. Right side abdominal pain Gallbladder thickening. Finding are indeterminate for acute cholecystitis.  LFT normal.  HIDA scan; inconclusive. Patient was not able to complete HIDA.  Surgery recommending treatment for pyelo, and GERD. If pain does not improved will need further work up of gallbladder.   Metabolic acidosis;  Continue with IV fluids.  IV antibiotics.  Improved.   Hypokalemia ; resolved.   HTN; continue with clonidine with holder parameters.   Anemia; iron deficiency;  Hb stable.  Start oral iron when infection is controlled.   Tabbacco use; counseling provide.     History of colitis 03-2017. Treated with  cipro and flagyl.   DVT prophylaxis: Lovenox Code Status: full code.  Family Communication: care discussed with patient.  Disposition Plan: remain in patient.    Consultants:   Surgery  Urology    Procedures:  RUQ [place]; Sludge within the gallbladder lumen. Gallbladder wall thickening. Findings are indeterminate for acute cholecystitis. Consider further  evaluation with HIDA scan as clinically indicated.  Antimicrobials:  Ceftriaxone 11-24   Subjective: She is feeling better, right side pain has improved.     Objective: Vitals:   08/05/17 1300 08/05/17 2049 08/06/17 0438 08/06/17 1342  BP: (!) 157/75 (!) 143/90 (!) 153/90 (!) 171/86  Pulse: 63 69 64 (!) 53  Resp: 20  20 18   Temp: 99.7 F (37.6 C) 99.7 F (37.6 C) 98.6 F (37 C) 98.6 F (37 C)  TempSrc: Oral Oral Oral Oral  SpO2: 100% 100% 100% 100%  Weight:      Height:        Intake/Output Summary (Last 24 hours) at 08/06/2017 1433 Last data filed at 08/06/2017 0200 Gross per 24 hour  Intake 2191.17 ml  Output 700 ml  Net 1491.17 ml   Filed Weights   08/03/17 1742  Weight: 56.7 kg (125 lb)    Examination:  General exam: NAD Respiratory system: CTA Cardiovascular system: S 1, S 2 RRR Gastrointestinal system: Soft, mild right side pain less tender, no rigidity  Central nervous system: non focal.  Extremities: Symmetric power.  Skin: No rash.      Data Reviewed: I have personally reviewed following labs and imaging studies  CBC: Recent Labs  Lab 08/02/17 1703 08/03/17 1109 08/04/17 0719 08/05/17 0625 08/06/17 0721  WBC  19.7* 20.1* 12.6* 6.1 5.7  NEUTROABS 17.9* 18.5*  --   --   --   HGB 11.5* 10.7* 10.3* 9.0* 10.4*  HCT 33.5* 30.8* 30.2* 26.9* 30.1*  MCV 93.1 91.9 92.6 92.1 91.5  PLT 326 345 332 351 380   Basic Metabolic Panel: Recent Labs  Lab 08/02/17 1703 08/03/17 1109 08/03/17 1158 08/04/17 0719 08/05/17 0625 08/06/17 0721  NA 134* 138  --  139 137 138  K 3.4* 2.9*  --   3.9 3.2* 3.5  CL 104 109  --  115* 114* 112*  CO2 18* 18*  --  15* 17* 17*  GLUCOSE 104* 125*  --  91 99 98  BUN 9 9  --  8 5* <5*  CREATININE 1.01* 0.89  --  0.74 0.66 0.48  CALCIUM 8.8* 8.6*  --  8.1* 7.9* 8.1*  MG  --   --  2.2  --   --   --    GFR: Estimated Creatinine Clearance: 73.9 mL/min (by C-G formula based on SCr of 0.48 mg/dL). Liver Function Tests: Recent Labs  Lab 08/02/17 1703 08/03/17 1109 08/04/17 1006  AST 28 20 22   ALT 34 28 24  ALKPHOS 115 111 85  BILITOT 1.0 0.6 0.5  PROT 7.4 7.0 5.4*  ALBUMIN 3.4* 3.1* 2.3*   Recent Labs  Lab 08/02/17 1740  LIPASE 24   No results for input(s): AMMONIA in the last 168 hours. Coagulation Profile: Recent Labs  Lab 08/02/17 1703 08/05/17 1302  INR 1.16 1.09   Cardiac Enzymes: No results for input(s): CKTOTAL, CKMB, CKMBINDEX, TROPONINI in the last 168 hours. BNP (last 3 results) No results for input(s): PROBNP in the last 8760 hours. HbA1C: No results for input(s): HGBA1C in the last 72 hours. CBG: No results for input(s): GLUCAP in the last 168 hours. Lipid Profile: No results for input(s): CHOL, HDL, LDLCALC, TRIG, CHOLHDL, LDLDIRECT in the last 72 hours. Thyroid Function Tests: No results for input(s): TSH, T4TOTAL, FREET4, T3FREE, THYROIDAB in the last 72 hours. Anemia Panel: Recent Labs    08/03/17 2018 08/03/17 2019  VITAMINB12  --  1,939*  FOLATE 14.3  --   FERRITIN  --  76  TIBC  --  185*  IRON  --  10*  RETICCTPCT  --  0.4   Sepsis Labs: Recent Labs  Lab 08/02/17 1724 08/02/17 1832 08/03/17 1601  LATICACIDVEN 0.97 1.70 1.49    Recent Results (from the past 240 hour(s))  Culture, blood (Routine x 2)     Status: Abnormal   Collection Time: 08/02/17  5:03 PM  Result Value Ref Range Status   Specimen Description BLOOD RIGHT ANTECUBITAL  Final   Special Requests   Final    BOTTLES DRAWN AEROBIC AND ANAEROBIC Blood Culture adequate volume   Culture  Setup Time   Final    GRAM NEGATIVE  RODS IN BOTH AEROBIC AND ANAEROBIC BOTTLES CRITICAL RESULT CALLED TO, READ BACK BY AND VERIFIED WITH: S GOUGE,RN AT [id number] 08/03/17 BY L BENFIELD    Culture ESCHERICHIA COLI (A)  Final   Report Status 08/05/2017 FINAL  Final   Organism ID, Bacteria ESCHERICHIA COLI  Final      Susceptibility   Escherichia coli - MIC*    AMPICILLIN <=2 SENSITIVE Sensitive     CEFAZOLIN <=4 SENSITIVE Sensitive     CEFEPIME <=1 SENSITIVE Sensitive     CEFTAZIDIME <=1 SENSITIVE Sensitive     CEFTRIAXONE <=1 SENSITIVE Sensitive  CIPROFLOXACIN <=0.25 SENSITIVE Sensitive     GENTAMICIN <=1 SENSITIVE Sensitive     IMIPENEM <=0.25 SENSITIVE Sensitive     TRIMETH/SULFA <=20 SENSITIVE Sensitive     AMPICILLIN/SULBACTAM <=2 SENSITIVE Sensitive     PIP/TAZO <=4 SENSITIVE Sensitive     Extended ESBL NEGATIVE Sensitive     * ESCHERICHIA COLI  Blood Culture ID Panel (Reflexed)     Status: Abnormal   Collection Time: 08/02/17  5:03 PM  Result Value Ref Range Status   Enterococcus species NOT DETECTED NOT DETECTED Final   Listeria monocytogenes NOT DETECTED NOT DETECTED Final   Staphylococcus species NOT DETECTED NOT DETECTED Final   Staphylococcus aureus NOT DETECTED NOT DETECTED Final   Streptococcus species NOT DETECTED NOT DETECTED Final   Streptococcus agalactiae NOT DETECTED NOT DETECTED Final   Streptococcus pneumoniae NOT DETECTED NOT DETECTED Final   Streptococcus pyogenes NOT DETECTED NOT DETECTED Final   Acinetobacter baumannii NOT DETECTED NOT DETECTED Final   Enterobacteriaceae species DETECTED (A) NOT DETECTED Final    Comment: Enterobacteriaceae represent a large family of gram-negative bacteria, not a single organism. CRITICAL RESULT CALLED TO, READ BACK BY AND VERIFIED WITH: S GOUGE,RN AT [id number] 08/03/17 BY L BENFIELD    Enterobacter cloacae complex NOT DETECTED NOT DETECTED Final   Escherichia coli DETECTED (A) NOT DETECTED Final    Comment: CRITICAL RESULT CALLED TO, READ BACK BY AND VERIFIED  WITH: S GOUGE,RN AT 9147 08/03/17 BY L BENFIELD    Klebsiella oxytoca NOT DETECTED NOT DETECTED Final   Klebsiella pneumoniae NOT DETECTED NOT DETECTED Final   Proteus species NOT DETECTED NOT DETECTED Final   Serratia marcescens NOT DETECTED NOT DETECTED Final   Carbapenem resistance NOT DETECTED NOT DETECTED Final   Haemophilus influenzae NOT DETECTED NOT DETECTED Final   Neisseria meningitidis NOT DETECTED NOT DETECTED Final   Pseudomonas aeruginosa NOT DETECTED NOT DETECTED Final   Candida albicans NOT DETECTED NOT DETECTED Final   Candida glabrata NOT DETECTED NOT DETECTED Final   Candida krusei NOT DETECTED NOT DETECTED Final   Candida parapsilosis NOT DETECTED NOT DETECTED Final   Candida tropicalis NOT DETECTED NOT DETECTED Final  Culture, blood (Routine x 2)     Status: Abnormal   Collection Time: 08/02/17  5:46 PM  Result Value Ref Range Status   Specimen Description BLOOD RIGHT ANTECUBITAL  Final   Special Requests   Final    BOTTLES DRAWN AEROBIC AND ANAEROBIC Blood Culture adequate volume   Culture  Setup Time   Final    GRAM NEGATIVE RODS AEROBIC BOTTLE ONLY CRITICAL RESULT CALLED TO, READ BACK BY AND VERIFIED WITH: S GOUGE,RN AT 1004 08/03/17 BY L BENFIELD    Culture (A)  Final    ESCHERICHIA COLI SUSCEPTIBILITIES PERFORMED ON PREVIOUS CULTURE WITHIN THE LAST 5 DAYS.    Report Status 08/05/2017 FINAL  Final  Urine culture     Status: Abnormal   Collection Time: 08/02/17  8:08 PM  Result Value Ref Range Status   Specimen Description URINE, RANDOM  Final   Special Requests URINE, CLEAN CATCH  Final   Culture MULTIPLE SPECIES PRESENT, SUGGEST RECOLLECTION (A)  Final   Report Status 08/04/2017 FINAL  Final  Blood culture (routine x 2)     Status: None (Preliminary result)   Collection Time: 08/03/17  3:36 PM  Result Value Ref Range Status   Specimen Description BLOOD RIGHT ANTECUBITAL  Final   Special Requests   Final    BOTTLES DRAWN AEROBIC  AND ANAEROBIC Blood  Culture adequate volume   Culture NO GROWTH 2 DAYS  Final   Report Status PENDING  Incomplete  Blood culture (routine x 2)     Status: None (Preliminary result)   Collection Time: 08/03/17  3:38 PM  Result Value Ref Range Status   Specimen Description BLOOD LEFT HAND  Final   Special Requests   Final    BOTTLES DRAWN AEROBIC AND ANAEROBIC Blood Culture adequate volume   Culture NO GROWTH 2 DAYS  Final   Report Status PENDING  Incomplete         Radiology Studies: Nm Hepatobiliary Liver Func  Result Date: 08/04/2017 CLINICAL DATA:  Gallbladder wall thickening and gallbladder sludge seen on ultrasound August 03, 2017 assess for acute cholecystitis EXAM: NUCLEAR MEDICINE HEPATOBILIARY IMAGING TECHNIQUE: Sequential images of the abdomen were obtained out to 60 minutes following intravenous administration of radiopharmaceutical. RADIOPHARMACEUTICALS:  5.2 mCi Tc-32m  Choletec IV COMPARISON:  CT scan August 02, 2017 and ultrasound August 03, 2017 FINDINGS: There is prompt uptake of radiotracer in the liver with prompt excretion into the small bowel and reflux into the stomach. No filling of the gallbladder by the end of the first hour of imaging. The patient refused to continue the study after the first hour and morphine was not utilized IMPRESSION: 1. The gallbladder did not fill within the first hour of imaging. The patient refused to continue the study and morphine was not utilized. As a result, the findings are nonspecific. Acute cholecystitis is not excluded or confirmed on this incomplete study. Electronically Signed   By: Gerome Sam III M.D   On: 08/04/2017 20:38   Ct Abdomen Pelvis W Contrast  Result Date: 08/05/2017 CLINICAL DATA:  Pyelonephritis. Nausea vomiting and diarrhea. Abdominal pain and fever. EXAM: CT ABDOMEN AND PELVIS WITH CONTRAST TECHNIQUE: Multidetector CT imaging of the abdomen and pelvis was performed using the standard protocol following bolus administration  of intravenous contrast. CONTRAST:  ISOVUE-300 IOPAMIDOL (ISOVUE-300) INJECTION 61% COMPARISON:  08/02/2017 FINDINGS: Lower chest:  Unremarkable. Hepatobiliary: No focal abnormality within the liver parenchyma. Mild periportal edema evident. Gallbladder is distended with layering sludge or non mineralized tiny stones. There is pericholecystic fluid. No intra or extrahepatic biliary duct dilatation. Pancreas: No focal mass lesion. No dilatation of the main duct. No intraparenchymal cyst. No peripancreatic edema. Spleen: No splenomegaly. No focal mass lesion. Adrenals/Urinary Tract: No adrenal nodule or mass. Multiple areas of segmental hypoperfusion are identified in the kidneys bilaterally. Interpolar left kidney demonstrates a 1.3 x 1.7 cm area of irregular fluid density (image 20 series 8). Hypoperfusion with edema is fairly marked in the inferior pole of the right kidney. No evidence for hydroureteronephrosis. Bladder is nondistended Stomach/Bowel: Stomach is nondistended. No gastric wall thickening. No evidence of outlet obstruction. Duodenum is normally positioned as is the ligament of Treitz. No small bowel wall thickening. No small bowel dilatation. The terminal ileum is normal. The appendix is normal. No gross colonic mass. No colonic wall thickening. No substantial diverticular change. Vascular/Lymphatic: No abdominal aortic aneurysm. No abdominal aortic atherosclerotic calcification. There is no gastrohepatic or hepatoduodenal ligament lymphadenopathy. No intraperitoneal or retroperitoneal lymphadenopathy. No pelvic sidewall lymphadenopathy. Reproductive: The uterus has normal CT imaging appearance. There is no adnexal mass. Other: Small volume free fluid in the cul-de-sac is similar to prior. Musculoskeletal: Tiny epigastric midline ventral hernia contains fat and a trace amount of fluid. Gas in the subcutaneous fat of the lower left anterior abdominal wall is likely  due to an injection site. The  Bone windows reveal no worrisome lytic or sclerotic osseous lesions. IMPRESSION: 1. Interval evolution of bilateral pyelonephritis with 1.3 x 1.7 cm intrarenal phlegmon or abscess in the interpolar left kidney. Areas of segmental edema/ hypoperfusion in both kidneys appear progressive concerning for worsening condition. 2. Mild periportal edema in the liver with some fluid around the gallbladder fossa. Small volume fluid in the cul-de-sac is similar to prior. 3. Layering sludge or non mineralized stones in the gallbladder. Electronically Signed   By: Kennith CenterEric  Mansell M.D.   On: 08/05/2017 16:51        Scheduled Meds: . cloNIDine  0.2 mg Oral BID  . enoxaparin (LOVENOX) injection  40 mg Subcutaneous Daily  . feeding supplement (ENSURE ENLIVE)  237 mL Oral BID BM  . pantoprazole  40 mg Oral Daily   Continuous Infusions: . sodium chloride 125 mL/hr at 08/06/17 0504  . cefTRIAXone (ROCEPHIN)  IV Stopped (08/06/17 0736)     LOS: 2 days    Time spent: 35 minutes.     Alba CoryBelkys A Pleas Carneal, MD [facility] Pager [phone]  If 7PM-7AM, please contact night-coverage [email or web address] Password [facility] 08/06/2017, 2:33 PM

## 2017-08-06 NOTE — Consult Note (Signed)
Chief Complaint: Patient was seen in consultation today for renal abscess  Referring Physician(s): Dr. Rhoderick Moody  Supervising Physician: Jolaine Click  Patient Status: [facility] - In-pt  History of Present Illness: Shirley [name] is a 41 y.o. female with past medical history of anemia, GERD, HTN, and recurrent UTI presented to [facility] with nausea and flank pain.   CT Abdomen Pelvis 11/23 showed: 1. Patchy hypoenhancement in both kidneys consistent with pyelonephritis. Possible developing 1.6 cm abscess in the left kidney. 2. Distended gallbladder with possible mild wall thickening. Right upper quadrant ultrasound could be performed further evaluation as clinically warranted. No calcified gallstones or biliary dilatation.  IR was consulted for possible aspiration and drainage, however due to size and location repeat imaging after a few days on conservative treatment was recommended.   CT Abdomen 11/26 showed: 1. Interval evolution of bilateral pyelonephritis with 1.3 x 1.7 cm intrarenal phlegmon or abscess in the interpolar left kidney. Areas of segmental edema/ hypoperfusion in both kidneys appear progressive concerning for worsening condition. 2. Mild periportal edema in the liver with some fluid around the gallbladder fossa. Small volume fluid in the cul-de-sac is similar to prior. 3. Layering sludge or non mineralized stones in the gallbladder.  Dr. Bonnielee Haff has reviewed imaging and case and approve patient for aspiration and possible drain placement.    Past Medical History:  Diagnosis Date  . Anemia   . Bacteremia   . Blood dyscrasia    has to apply a lot of pressure to stop bleeding  . GERD (gastroesophageal reflux disease)   . Heart murmur    with pregnancy  . History of hiatal hernia   . Hypertension    4-5 years ago, no meds  . Kidney infection    patient states I might have a kidney infection  . Migraine    migraines  . Pyelonephritis     Past Surgical  History:  Procedure Laterality Date  . ABDOMINAL HERNIA REPAIR    . CESAREAN SECTION     x 4  . CESAREAN SECTION     x 4  . ENDOMETRIAL ABLATION  07/31/2016  . HYSTEROSCOPY  07/31/2016   Procedure: HYSTEROSCOPY WITH HYDROTHERMAL ABLATION;  Surgeon: Bayfield Bing, MD;  Location: [facility];  Service: Gynecology;;    Allergies: Lisinopril; Aspirin; and Ibuprofen  Medications: Prior to Admission medications   Medication Sig Start Date End Date Taking? Authorizing Provider  acetaminophen (TYLENOL) 500 MG tablet Take 1,000 mg by mouth every 6 (six) hours as needed for moderate pain or headache.   Yes [provider]  albuterol (PROVENTIL HFA;VENTOLIN HFA) 108 (90 Base) MCG/ACT inhaler Inhale 2 puffs into the lungs every 6 (six) hours as needed for wheezing or shortness of breath.   Yes [provider]  cloNIDine (CATAPRES) 0.2 MG tablet Take 0.2 mg by mouth 2 (two) times daily.    Yes [provider]  ondansetron (ZOFRAN-ODT) 8 MG disintegrating tablet Take 8 mg by mouth every 8 (eight) hours as needed for nausea or vomiting.   Yes [provider]  polyethylene glycol powder (MIRALAX) powder 1 capful daily until normal bowel movements resume Patient taking differently: Take by mouth daily as needed for mild constipation.  08/06/16  Yes Marny Lowenstein, PA-C  diphenhydrAMINE (BENADRYL) 25 MG tablet Take 25 mg by mouth every 6 (six) hours as needed for allergies.    [provider]  ondansetron (ZOFRAN) 4 MG tablet Take 1 tablet (4 mg total) by  mouth every 6 (six) hours as needed for nausea or vomiting. 08/03/17   Lavera Guise, MD  oxyCODONE-acetaminophen (PERCOCET) 5-325 MG tablet Take 1 tablet by mouth every 6 (six) hours as needed. Patient taking differently: Take 1 tablet by mouth every 6 (six) hours as needed for moderate pain.  04/02/17   Bethann Berkshire, MD  potassium chloride SA (K-DUR,KLOR-CON) 20 MEQ tablet Take 2 tablets (40 mEq total) by  mouth 2 (two) times daily. 08/03/17   Lavera Guise, MD     Family History  Problem Relation Age of Onset  . Hypertension Mother   . Colon cancer Father 22  . Stroke Brother     Social History   Socioeconomic History  . Marital status: Married    Spouse name: None  . Number of children: None  . Years of education: None  . Highest education level: None  Social Needs  . Financial resource strain: None  . Food insecurity - worry: None  . Food insecurity - inability: None  . Transportation needs - medical: None  . Transportation needs - non-medical: None  Occupational History  . None  Tobacco Use  . Smoking status: Current Every Day Smoker    Packs/day: 0.50    Types: Cigarettes  . Smokeless tobacco: Never Used  Substance and Sexual Activity  . Alcohol use: No    Comment: occasionally  . Drug use: Yes    Types: Marijuana  . Sexual activity: Yes    Birth control/protection: None  Other Topics Concern  . None  Social History Narrative  . None   Review of Systems  Constitutional: Negative for fatigue and fever.  Respiratory: Negative for cough and shortness of breath.   Cardiovascular: Negative for chest pain.  Gastrointestinal: Negative for abdominal pain.  Genitourinary: Positive for flank pain.  Musculoskeletal: Positive for back pain.  Psychiatric/Behavioral: Negative for behavioral problems and confusion.    Vital Signs: BP (!) 153/90 (BP Location: Right Arm)   Pulse 64   Temp 98.6 F (37 C) (Oral)   Resp 20   Ht 5\' 2"  (1.575 m)   Wt 125 lb (56.7 kg)   SpO2 100%   BMI 22.86 kg/m   Physical Exam  Constitutional: She is oriented to person, place, and time. She appears well-developed.  Cardiovascular: Normal rate, regular rhythm and normal heart sounds.  Pulmonary/Chest: Effort normal and breath sounds normal. No respiratory distress.  Abdominal: Soft.  Musculoskeletal: She exhibits tenderness (bilateral flank).  Neurological: She is alert and  oriented to person, place, and time.  Skin: Skin is warm and dry.  Psychiatric: She has a normal mood and affect. Her behavior is normal. Judgment and thought content normal.  Nursing note and vitals reviewed.   Imaging: Dg Chest 2 View  Result Date: 08/02/2017 CLINICAL DATA:  Chest pain, fever, chills. EXAM: CHEST  2 VIEW COMPARISON:  Radiograph of May 09, 2017. FINDINGS: The heart size and mediastinal contours are within normal limits. Both lungs are clear. No pneumothorax or pleural effusion is noted. The visualized skeletal structures are unremarkable. IMPRESSION: No active cardiopulmonary disease. Electronically Signed   By: Lupita Raider, M.D.   On: 08/02/2017 17:23   Ct Head Wo Contrast  Result Date: 08/02/2017 CLINICAL DATA:  Lightheadedness and headache for 3 days. EXAM: CT HEAD WITHOUT CONTRAST TECHNIQUE: Contiguous axial images were obtained from the base of the skull through the vertex without intravenous contrast. COMPARISON:  Head CT scan 05/09/2017 02/12/2007. FINDINGS: Brain: Appears  normal without hemorrhage, infarct, mass lesion, mass effect, midline shift or abnormal extra-axial fluid collection. No hydrocephalus or pneumocephalus. Vascular: Negative. Skull: Intact. Sinuses/Orbits: Negative. Other: None. IMPRESSION: Normal head CT. Electronically Signed   By: Drusilla Kanner M.D.   On: 08/02/2017 19:08   Nm Hepatobiliary Liver Func  Result Date: 08/04/2017 CLINICAL DATA:  Gallbladder wall thickening and gallbladder sludge seen on ultrasound August 03, 2017 assess for acute cholecystitis EXAM: NUCLEAR MEDICINE HEPATOBILIARY IMAGING TECHNIQUE: Sequential images of the abdomen were obtained out to 60 minutes following intravenous administration of radiopharmaceutical. RADIOPHARMACEUTICALS:  5.2 mCi Tc-51m  Choletec IV COMPARISON:  CT scan August 02, 2017 and ultrasound August 03, 2017 FINDINGS: There is prompt uptake of radiotracer in the liver with prompt excretion  into the small bowel and reflux into the stomach. No filling of the gallbladder by the end of the first hour of imaging. The patient refused to continue the study after the first hour and morphine was not utilized IMPRESSION: 1. The gallbladder did not fill within the first hour of imaging. The patient refused to continue the study and morphine was not utilized. As a result, the findings are nonspecific. Acute cholecystitis is not excluded or confirmed on this incomplete study. Electronically Signed   By: Gerome Sam III M.D   On: 08/04/2017 20:38   Ct Abdomen Pelvis W Contrast  Result Date: 08/05/2017 CLINICAL DATA:  Pyelonephritis. Nausea vomiting and diarrhea. Abdominal pain and fever. EXAM: CT ABDOMEN AND PELVIS WITH CONTRAST TECHNIQUE: Multidetector CT imaging of the abdomen and pelvis was performed using the standard protocol following bolus administration of intravenous contrast. CONTRAST:  ISOVUE-300 IOPAMIDOL (ISOVUE-300) INJECTION 61% COMPARISON:  08/02/2017 FINDINGS: Lower chest:  Unremarkable. Hepatobiliary: No focal abnormality within the liver parenchyma. Mild periportal edema evident. Gallbladder is distended with layering sludge or non mineralized tiny stones. There is pericholecystic fluid. No intra or extrahepatic biliary duct dilatation. Pancreas: No focal mass lesion. No dilatation of the main duct. No intraparenchymal cyst. No peripancreatic edema. Spleen: No splenomegaly. No focal mass lesion. Adrenals/Urinary Tract: No adrenal nodule or mass. Multiple areas of segmental hypoperfusion are identified in the kidneys bilaterally. Interpolar left kidney demonstrates a 1.3 x 1.7 cm area of irregular fluid density (image 20 series 8). Hypoperfusion with edema is fairly marked in the inferior pole of the right kidney. No evidence for hydroureteronephrosis. Bladder is nondistended Stomach/Bowel: Stomach is nondistended. No gastric wall thickening. No evidence of outlet obstruction.  Duodenum is normally positioned as is the ligament of Treitz. No small bowel wall thickening. No small bowel dilatation. The terminal ileum is normal. The appendix is normal. No gross colonic mass. No colonic wall thickening. No substantial diverticular change. Vascular/Lymphatic: No abdominal aortic aneurysm. No abdominal aortic atherosclerotic calcification. There is no gastrohepatic or hepatoduodenal ligament lymphadenopathy. No intraperitoneal or retroperitoneal lymphadenopathy. No pelvic sidewall lymphadenopathy. Reproductive: The uterus has normal CT imaging appearance. There is no adnexal mass. Other: Small volume free fluid in the cul-de-sac is similar to prior. Musculoskeletal: Tiny epigastric midline ventral hernia contains fat and a trace amount of fluid. Gas in the subcutaneous fat of the lower left anterior abdominal wall is likely due to an injection site. The Bone windows reveal no worrisome lytic or sclerotic osseous lesions. IMPRESSION: 1. Interval evolution of bilateral pyelonephritis with 1.3 x 1.7 cm intrarenal phlegmon or abscess in the interpolar left kidney. Areas of segmental edema/ hypoperfusion in both kidneys appear progressive concerning for worsening condition. 2. Mild periportal edema in the  liver with some fluid around the gallbladder fossa. Small volume fluid in the cul-de-sac is similar to prior. 3. Layering sludge or non mineralized stones in the gallbladder. Electronically Signed   By: Kennith CenterEric  Mansell M.D.   On: 08/05/2017 16:51   Ct Abdomen Pelvis W Contrast  Result Date: 08/02/2017 CLINICAL DATA:  Lower abdominal pain beginning 2 days ago, greater in the right lower quadrant today. EXAM: CT ABDOMEN AND PELVIS WITH CONTRAST TECHNIQUE: Multidetector CT imaging of the abdomen and pelvis was performed using the standard protocol following bolus administration of intravenous contrast. CONTRAST:  100mL ISOVUE-300 IOPAMIDOL (ISOVUE-300) INJECTION 61% COMPARISON:  04/02/2017 FINDINGS:  Lower chest: Minimal dependent atelectasis in the lung bases. No pleural effusion. Hepatobiliary: No focal liver abnormality is seen. The gallbladder is moderately distended with possible mild wall thickening. No calcified gallstones are identified, although there may be subtly hyperattenuating material dependently in the gallbladder which could reflect sludge. No biliary dilatation. Pancreas: Unremarkable. Spleen: Unremarkable. Adrenals/Urinary Tract: Unremarkable adrenal glands. There is a 1.6 cm well-defined low density focus laterally in the interpolar left kidney which is new. Additional patchy areas of more hypoenhancement are present in both kidneys, with the most extensive involvement being in the lower pole on the right. There is mild perinephric stranding inferiorly on the right. There is no hydronephrosis. The bladder is unremarkable. Stomach/Bowel: The stomach is within normal limits. There is no evidence of bowel obstruction. The appendix is not confidently identified, however no significant inflammatory changes are seen about the cecum to strongly suggest acute appendicitis. Vascular/Lymphatic: Minimal atherosclerotic calcification of the distal abdominal aorta. No enlarged lymph nodes. Reproductive: Unremarkable uterus and right adnexa. Suspected left ovarian corpus luteum. Other: Small volume pelvic free fluid. Unchanged 14 mm hypodense focus in the upper anterior abdominal wall which may reflect fluid in a small ventral hernia. Musculoskeletal: No acute osseous abnormality or suspicious osseous lesion. IMPRESSION: 1. Patchy hypoenhancement in both kidneys consistent with pyelonephritis. Possible developing 1.6 cm abscess in the left kidney. 2. Distended gallbladder with possible mild wall thickening. Right upper quadrant ultrasound could be performed further evaluation as clinically warranted. No calcified gallstones or biliary dilatation. Electronically Signed   By: Sebastian AcheAllen  Grady M.D.   On:  08/02/2017 19:26   [place] Abdomen Limited Ruq  Result Date: 08/03/2017 CLINICAL DATA:  Patient with abdominal pain and vomiting. EXAM: ULTRASOUND ABDOMEN LIMITED RIGHT UPPER QUADRANT COMPARISON:  CT abdomen 08/02/2017 FINDINGS: Gallbladder: Sludge within the gallbladder lumen. Mild gallbladder wall thickening. Negative sonographic Murphy's sign. No significant pericholecystic fluid. Common bile duct: Diameter: 4 mm Liver: No focal lesion identified. Within normal limits in parenchymal echogenicity. Portal vein is patent on color Doppler imaging with normal direction of blood flow towards the liver. IMPRESSION: Sludge within the gallbladder lumen. Gallbladder wall thickening. Findings are indeterminate for acute cholecystitis. Consider further evaluation with HIDA scan as clinically indicated. Electronically Signed   By: Annia Beltrew  Davis M.D.   On: 08/03/2017 19:34    Labs:  CBC: Recent Labs    08/03/17 1109 08/04/17 0719 08/05/17 0625 08/06/17 0721  WBC 20.1* 12.6* 6.1 5.7  HGB 10.7* 10.3* 9.0* 10.4*  HCT 30.8* 30.2* 26.9* 30.1*  PLT 345 332 351 380    COAGS: Recent Labs    08/02/17 1703 08/05/17 1302  INR 1.16 1.09    BMP: Recent Labs    08/03/17 1109 08/04/17 0719 08/05/17 0625 08/06/17 0721  NA 138 139 137 138  K 2.9* 3.9 3.2* 3.5  CL 109 115*  114* 112*  CO2 18* 15* 17* 17*  GLUCOSE 125* 91 99 98  BUN 9 8 5* <5*  CALCIUM 8.6* 8.1* 7.9* 8.1*  CREATININE 0.89 0.74 0.66 0.48  GFRNONAA >60 >60 >60 >60  GFRAA >60 >60 >60 >60    LIVER FUNCTION TESTS: Recent Labs    05/09/17 1640 08/02/17 1703 08/03/17 1109 08/04/17 1006  BILITOT 0.5 1.0 0.6 0.5  AST 37 28 20 22   ALT 20 34 28 24  ALKPHOS 45 115 111 85  PROT 7.4 7.4 7.0 5.4*  ALBUMIN 4.4 3.4* 3.1* 2.3*    TUMOR MARKERS: No results for input(s): AFPTM, CEA, CA199, CHROMGRNA in the last 8760 hours.  Assessment and Plan: Patient with past medical history of recurrent UTI presents with complaint of pyelonephritis  and renal abscess.  IR consulted for asirpation at the request of Dr. Liliane ShiWinter. Case reviewed by Dr. Bonnielee HaffHoss who approves patient for procedure.  Patient presents today in their usual state of health.  She has been NPO and is not currently on blood thinners.  Risks and benefits discussed with the patient including bleeding, infection, damage to adjacent structures, bowel perforation/fistula connection, and sepsis. All of the patient's questions were answered, patient is agreeable to proceed. Consent signed and in chart.  Thank you for this interesting consult.  I greatly enjoyed meeting Shirley AlleyLinda A Sumner and look forward to participating in their care.  A copy of this report was sent to the requesting provider on this date.  Electronically Signed: Hoyt KochKacie Sue-Ellen Kevin Space, PA 08/06/2017, 9:20 AM   I spent a total of 40 Minutes    in face to face in clinical consultation, greater than 50% of which was counseling/coordinating care for renal abscess

## 2017-08-07 DIAGNOSIS — I10 Essential (primary) hypertension: Secondary | ICD-10-CM

## 2017-08-07 DIAGNOSIS — N12 Tubulo-interstitial nephritis, not specified as acute or chronic: Secondary | ICD-10-CM

## 2017-08-07 DIAGNOSIS — R7881 Bacteremia: Secondary | ICD-10-CM

## 2017-08-07 DIAGNOSIS — E876 Hypokalemia: Secondary | ICD-10-CM

## 2017-08-07 DIAGNOSIS — N151 Renal and perinephric abscess: Secondary | ICD-10-CM

## 2017-08-07 LAB — CULTURE, BLOOD (ROUTINE X 2): Special Requests: ADEQUATE

## 2017-08-07 LAB — BASIC METABOLIC PANEL
Anion gap: 9 (ref 5–15)
BUN: <5 mg/dL — ABNORMAL LOW (ref 6–20)
CO2: 19 mmol/L — ABNORMAL LOW (ref 22–32)
Calcium: 8.5 mg/dL — ABNORMAL LOW (ref 8.9–10.3)
Chloride: 110 mmol/L (ref 101–111)
Creatinine, Ser: 0.54 mg/dL (ref 0.44–1.00)
GFR calc Af Amer: >60 mL/min (ref >60)
GFR calc non Af Amer: >60 mL/min (ref >60)
Glucose, Bld: 97 mg/dL (ref 65–99)
Potassium: 2.8 mmol/L — ABNORMAL LOW (ref 3.5–5.1)
Sodium: 138 mmol/L (ref 135–145)

## 2017-08-07 LAB — CBC
HCT: 28.9 % — ABNORMAL LOW (ref 36.0–46.0)
Hemoglobin: 10 g/dL — ABNORMAL LOW (ref 12.0–15.0)
MCH: 31.2 pg (ref 26.0–34.0)
MCHC: 34.6 g/dL (ref 30.0–36.0)
MCV: 90 fL (ref 78.0–100.0)
Platelets: 452 10*3/uL — ABNORMAL HIGH (ref 150–400)
RBC: 3.21 MIL/uL — ABNORMAL LOW (ref 3.87–5.11)
RDW: 13.8 % (ref 11.5–15.5)
WBC: 6.7 10*3/uL (ref 4.0–10.5)

## 2017-08-07 LAB — MAGNESIUM: Magnesium: 1.4 mg/dL — ABNORMAL LOW (ref 1.7–2.4)

## 2017-08-07 MED ORDER — MAGNESIUM SULFATE 2 GM/50ML IV SOLN
2.0000 g | Freq: Once | INTRAVENOUS | Status: AC
  Administered 2017-08-07: 2 g via INTRAVENOUS
  Filled 2017-08-07: qty 50

## 2017-08-07 MED ORDER — CEPHALEXIN 500 MG PO CAPS
500.0000 mg | ORAL_CAPSULE | Freq: Four times a day (QID) | ORAL | Status: DC
  Administered 2017-08-07 – 2017-08-08 (×5): 500 mg via ORAL
  Filled 2017-08-07 (×5): qty 1

## 2017-08-07 MED ORDER — POTASSIUM CHLORIDE CRYS ER 20 MEQ PO TBCR
40.0000 meq | EXTENDED_RELEASE_TABLET | ORAL | Status: AC
  Administered 2017-08-07 (×2): 40 meq via ORAL
  Filled 2017-08-07 (×2): qty 2

## 2017-08-07 MED ORDER — MAGNESIUM OXIDE 400 (241.3 MG) MG PO TABS
400.0000 mg | ORAL_TABLET | Freq: Two times a day (BID) | ORAL | Status: DC
  Administered 2017-08-07 – 2017-08-08 (×3): 400 mg via ORAL
  Filled 2017-08-07 (×3): qty 1

## 2017-08-07 NOTE — Progress Notes (Signed)
[facility] PROGRESS NOTE  Shirley AlleyLinda A [name] ONG:[id number]:[id number] DOB: 1976/07/22 DOA: 08/03/2017  PCP: [facility]  Brief History/Interval Summary: 41 year old female with past medical history of hypertension, recurrent UTIs, GERD presented with complains of nausea vomiting flank pain.  She was found to have findings suggestive of pyelonephritis.  Imaging study showed a small left renal abscess.  She was seen by interventional radial and underwent drainage of this abscess.  Patient also seen by urology and general surgery  Reason for Visit: Acute Pyelonephritis with renal abscess  Consultants: General surgery.  Urology.  Interventional radiology  Procedures: Aspiration of the left renal abscess 11/27  Antibiotics: Ceftriaxone changed over to Keflex today  Subjective/Interval History: Patient states that she is feeling better.  Left flank pain has improved.  She is tolerating her diet.  Denies any nausea vomiting.  ROS: Denies any chest pain or shortness of breath  Objective:  Vital Signs  Vitals:   08/06/17 1640 08/06/17 1653 08/06/17 2014 08/07/17 0417  BP: (!) 149/106 (!) 147/86 (!) 161/89 (!) 161/82  Pulse: (!) 58 (!) 54 63 (!) 56  Resp: 15 18 18 18   Temp:   98.5 F (36.9 C) 98.5 F (36.9 C)  TempSrc:   Oral Oral  SpO2: 100% 100% 100% 100%  Weight:      Height:        Intake/Output Summary (Last 24 hours) at 08/07/2017 1254 Last data filed at 08/07/2017 0900 Gross per 24 hour  Intake 3414.17 ml  Output 0 ml  Net 3414.17 ml   Filed Weights   08/03/17 1742  Weight: 56.7 kg (125 lb)    General appearance: alert, cooperative, appears stated age and no distress Head: Normocephalic, without obvious abnormality, atraumatic Resp: clear to auscultation bilaterally Cardio: regular rate and rhythm, S1, S2 normal, no murmur, click, rub or gallop GI: Abdomen is tender diffusely.  No rebound or guarding.  Left CVA tenderness is present.  No  masses organomegaly. Extremities: extremities normal, atraumatic, no cyanosis or edema Neurologic: No focal deficits.  Lab Results:  Data Reviewed: I have personally reviewed following labs and imaging studies  CBC: Recent Labs  Lab 08/02/17 1703 08/03/17 1109 08/04/17 0719 08/05/17 0625 08/06/17 0721 08/07/17 0521  WBC 19.7* 20.1* 12.6* 6.1 5.7 6.7  NEUTROABS 17.9* 18.5*  --   --   --   --   HGB 11.5* 10.7* 10.3* 9.0* 10.4* 10.0*  HCT 33.5* 30.8* 30.2* 26.9* 30.1* 28.9*  MCV 93.1 91.9 92.6 92.1 91.5 90.0  PLT 326 345 332 351 380 452*    Basic Metabolic Panel: Recent Labs  Lab 08/03/17 1109 08/03/17 1158 08/04/17 0719 08/05/17 0625 08/06/17 0721 08/07/17 0521  NA 138  --  139 137 138 138  K 2.9*  --  3.9 3.2* 3.5 2.8*  CL 109  --  115* 114* 112* 110  CO2 18*  --  15* 17* 17* 19*  GLUCOSE 125*  --  91 99 98 97  BUN 9  --  8 5* <5* <5*  CREATININE 0.89  --  0.74 0.66 0.48 0.54  CALCIUM 8.6*  --  8.1* 7.9* 8.1* 8.5*  MG  --  2.2  --   --   --  1.4*    GFR: Estimated Creatinine Clearance: 73.9 mL/min (by C-G formula based on SCr of 0.54 mg/dL).  Liver Function Tests: Recent Labs  Lab 08/02/17 1703 08/03/17 1109 08/04/17 1006  AST 28 20 22   ALT  34 28 24  ALKPHOS 115 111 85  BILITOT 1.0 0.6 0.5  PROT 7.4 7.0 5.4*  ALBUMIN 3.4* 3.1* 2.3*    Recent Labs  Lab 08/02/17 1740  LIPASE 24    Coagulation Profile: Recent Labs  Lab 08/02/17 1703 08/05/17 1302  INR 1.16 1.09     Recent Results (from the past 240 hour(s))  Culture, blood (Routine x 2)     Status: Abnormal   Collection Time: 08/02/17  5:03 PM  Result Value Ref Range Status   Specimen Description BLOOD RIGHT ANTECUBITAL  Final   Special Requests   Final    BOTTLES DRAWN AEROBIC AND ANAEROBIC Blood Culture adequate volume   Culture  Setup Time   Final    GRAM NEGATIVE RODS IN BOTH AEROBIC AND ANAEROBIC BOTTLES CRITICAL RESULT CALLED TO, READ BACK BY AND VERIFIED WITH: S GOUGE,RN AT  1610 08/03/17 BY L BENFIELD    Culture ESCHERICHIA COLI (A)  Final   Report Status 08/05/2017 FINAL  Final   Organism ID, Bacteria ESCHERICHIA COLI  Final      Susceptibility   Escherichia coli - MIC*    AMPICILLIN <=2 SENSITIVE Sensitive     CEFAZOLIN <=4 SENSITIVE Sensitive     CEFEPIME <=1 SENSITIVE Sensitive     CEFTAZIDIME <=1 SENSITIVE Sensitive     CEFTRIAXONE <=1 SENSITIVE Sensitive     CIPROFLOXACIN <=0.25 SENSITIVE Sensitive     GENTAMICIN <=1 SENSITIVE Sensitive     IMIPENEM <=0.25 SENSITIVE Sensitive     TRIMETH/SULFA <=20 SENSITIVE Sensitive     AMPICILLIN/SULBACTAM <=2 SENSITIVE Sensitive     PIP/TAZO <=4 SENSITIVE Sensitive     Extended ESBL NEGATIVE Sensitive     * ESCHERICHIA COLI  Blood Culture ID Panel (Reflexed)     Status: Abnormal   Collection Time: 08/02/17  5:03 PM  Result Value Ref Range Status   Enterococcus species NOT DETECTED NOT DETECTED Final   Listeria monocytogenes NOT DETECTED NOT DETECTED Final   Staphylococcus species NOT DETECTED NOT DETECTED Final   Staphylococcus aureus NOT DETECTED NOT DETECTED Final   Streptococcus species NOT DETECTED NOT DETECTED Final   Streptococcus agalactiae NOT DETECTED NOT DETECTED Final   Streptococcus pneumoniae NOT DETECTED NOT DETECTED Final   Streptococcus pyogenes NOT DETECTED NOT DETECTED Final   Acinetobacter baumannii NOT DETECTED NOT DETECTED Final   Enterobacteriaceae species DETECTED (A) NOT DETECTED Final    Comment: Enterobacteriaceae represent a large family of gram-negative bacteria, not a single organism. CRITICAL RESULT CALLED TO, READ BACK BY AND VERIFIED WITH: S GOUGE,RN AT 9604 08/03/17 BY L BENFIELD    Enterobacter cloacae complex NOT DETECTED NOT DETECTED Final   Escherichia coli DETECTED (A) NOT DETECTED Final    Comment: CRITICAL RESULT CALLED TO, READ BACK BY AND VERIFIED WITH: S GOUGE,RN AT 5409 08/03/17 BY L BENFIELD    Klebsiella oxytoca NOT DETECTED NOT DETECTED Final    Klebsiella pneumoniae NOT DETECTED NOT DETECTED Final   Proteus species NOT DETECTED NOT DETECTED Final   Serratia marcescens NOT DETECTED NOT DETECTED Final   Carbapenem resistance NOT DETECTED NOT DETECTED Final   Haemophilus influenzae NOT DETECTED NOT DETECTED Final   Neisseria meningitidis NOT DETECTED NOT DETECTED Final   Pseudomonas aeruginosa NOT DETECTED NOT DETECTED Final   Candida albicans NOT DETECTED NOT DETECTED Final   Candida glabrata NOT DETECTED NOT DETECTED Final   Candida krusei NOT DETECTED NOT DETECTED Final   Candida parapsilosis NOT DETECTED NOT DETECTED Final  Candida tropicalis NOT DETECTED NOT DETECTED Final  Culture, blood (Routine x 2)     Status: Abnormal   Collection Time: 08/02/17  5:46 PM  Result Value Ref Range Status   Specimen Description BLOOD RIGHT ANTECUBITAL  Final   Special Requests   Final    BOTTLES DRAWN AEROBIC AND ANAEROBIC Blood Culture adequate volume   Culture  Setup Time   Final    GRAM NEGATIVE RODS IN BOTH AEROBIC AND ANAEROBIC BOTTLES CRITICAL RESULT CALLED TO, READ BACK BY AND VERIFIED WITH: S GOUGE,RN AT 1004 08/03/17 BY L BENFIELD    Culture (A)  Final    ESCHERICHIA COLI SUSCEPTIBILITIES PERFORMED ON PREVIOUS CULTURE WITHIN THE LAST 5 DAYS.    Report Status 08/05/2017 FINAL  Final  Urine culture     Status: Abnormal   Collection Time: 08/02/17  8:08 PM  Result Value Ref Range Status   Specimen Description URINE, RANDOM  Final   Special Requests URINE, CLEAN CATCH  Final   Culture MULTIPLE SPECIES PRESENT, SUGGEST RECOLLECTION (A)  Final   Report Status 08/04/2017 FINAL  Final  Blood culture (routine x 2)     Status: None (Preliminary result)   Collection Time: 08/03/17  3:36 PM  Result Value Ref Range Status   Specimen Description BLOOD RIGHT ANTECUBITAL  Final   Special Requests   Final    BOTTLES DRAWN AEROBIC AND ANAEROBIC Blood Culture adequate volume   Culture NO GROWTH 3 DAYS  Final   Report Status PENDING   Incomplete  Blood culture (routine x 2)     Status: None (Preliminary result)   Collection Time: 08/03/17  3:38 PM  Result Value Ref Range Status   Specimen Description BLOOD LEFT HAND  Final   Special Requests   Final    BOTTLES DRAWN AEROBIC AND ANAEROBIC Blood Culture adequate volume   Culture NO GROWTH 3 DAYS  Final   Report Status PENDING  Incomplete  Aerobic/Anaerobic Culture (surgical/deep wound)     Status: None (Preliminary result)   Collection Time: 08/06/17  5:00 PM  Result Value Ref Range Status   Specimen Description WOUND  Final   Special Requests RENAL ABSCESS  Final   Gram Stain   Final    RARE WBC PRESENT, PREDOMINANTLY PMN NO ORGANISMS SEEN    Culture PENDING  Incomplete   Report Status PENDING  Incomplete      Radiology Studies: Ct Abdomen Pelvis W Contrast  Result Date: 08/05/2017 CLINICAL DATA:  Pyelonephritis. Nausea vomiting and diarrhea. Abdominal pain and fever. EXAM: CT ABDOMEN AND PELVIS WITH CONTRAST TECHNIQUE: Multidetector CT imaging of the abdomen and pelvis was performed using the standard protocol following bolus administration of intravenous contrast. CONTRAST:  100mL ISOVUE-300 IOPAMIDOL (ISOVUE-300) INJECTION 61% COMPARISON:  08/02/2017 FINDINGS: Lower chest:  Unremarkable. Hepatobiliary: No focal abnormality within the liver parenchyma. Mild periportal edema evident. Gallbladder is distended with layering sludge or non mineralized tiny stones. There is pericholecystic fluid. No intra or extrahepatic biliary duct dilatation. Pancreas: No focal mass lesion. No dilatation of the main duct. No intraparenchymal cyst. No peripancreatic edema. Spleen: No splenomegaly. No focal mass lesion. Adrenals/Urinary Tract: No adrenal nodule or mass. Multiple areas of segmental hypoperfusion are identified in the kidneys bilaterally. Interpolar left kidney demonstrates a 1.3 x 1.7 cm area of irregular fluid density (image 20 series 8). Hypoperfusion with edema is fairly  marked in the inferior pole of the right kidney. No evidence for hydroureteronephrosis. Bladder is nondistended Stomach/Bowel: Stomach is  nondistended. No gastric wall thickening. No evidence of outlet obstruction. Duodenum is normally positioned as is the ligament of Treitz. No small bowel wall thickening. No small bowel dilatation. The terminal ileum is normal. The appendix is normal. No gross colonic mass. No colonic wall thickening. No substantial diverticular change. Vascular/Lymphatic: No abdominal aortic aneurysm. No abdominal aortic atherosclerotic calcification. There is no gastrohepatic or hepatoduodenal ligament lymphadenopathy. No intraperitoneal or retroperitoneal lymphadenopathy. No pelvic sidewall lymphadenopathy. Reproductive: The uterus has normal CT imaging appearance. There is no adnexal mass. Other: Small volume free fluid in the cul-de-sac is similar to prior. Musculoskeletal: Tiny epigastric midline ventral hernia contains fat and a trace amount of fluid. Gas in the subcutaneous fat of the lower left anterior abdominal wall is likely due to an injection site. The Bone windows reveal no worrisome lytic or sclerotic osseous lesions. IMPRESSION: 1. Interval evolution of bilateral pyelonephritis with 1.3 x 1.7 cm intrarenal phlegmon or abscess in the interpolar left kidney. Areas of segmental edema/ hypoperfusion in both kidneys appear progressive concerning for worsening condition. 2. Mild periportal edema in the liver with some fluid around the gallbladder fossa. Small volume fluid in the cul-de-sac is similar to prior. 3. Layering sludge or non mineralized stones in the gallbladder. Electronically Signed   By: Kennith Center M.D.   On: 08/05/2017 16:51   Ct Image Guided Drainage By Percutaneous Catheter  Result Date: 08/06/2017 CLINICAL DATA:  Left renal abscess EXAM: CT IMAGE GUIDED DRAINAGE BY PERCUTANEOUS CATHETER FLUOROSCOPY TIME:  None minutes MEDICATIONS AND MEDICAL HISTORY: Versed  Three mg, Fentanyl 150 mcg. Additional Medications: None. ANESTHESIA/SEDATION: Moderate sedation time: 13 minutes CONTRAST:  None PROCEDURE: The procedure, risks, benefits, and alternatives were explained to the patient. Questions regarding the procedure were encouraged and answered. The patient understands and consents to the procedure. The back in the prone position was prepped with ChloraPrep in a sterile fashion, and a sterile drape was applied covering the operative field. A sterile gown and sterile gloves were used for the procedure. Under CT guidance, an 18 gauge needle was inserted into the left renal abscess in the interpolar region. Aspiration yielded 2 drops bloody fluid. This was sent for culture FINDINGS: Imaging documents needle placement in the ill-defined phlegmon versus abscess within the left kidney. COMPLICATIONS: None IMPRESSION: Successful aspiration of a left renal abscess yielding bloody fluid Electronically Signed   By: Jolaine Click M.D.   On: 08/06/2017 17:19     Medications:  Scheduled: . cephALEXin  500 mg Oral Q6H  . cloNIDine  0.2 mg Oral BID  . enoxaparin (LOVENOX) injection  40 mg Subcutaneous Daily  . feeding supplement (ENSURE ENLIVE)  237 mL Oral BID BM  . magnesium oxide  400 mg Oral BID  . pantoprazole  40 mg Oral Daily   Continuous: . sodium chloride 10 mL/hr at 08/07/17 0931   ZOX:[id number], albuterol, diphenhydrAMINE, hydrALAZINE, HYDROcodone-acetaminophen, morphine injection, ondansetron **OR** ondansetron (ZOFRAN) IV, polyethylene glycol, traZODone  Assessment/Plan:  Principal Problem:   Bacteremia Active Problems:   Essential hypertension   Anemia   Pyelonephritis   Hypokalemia   Tobacco use    Acute pyelonephritis involving both kidneys with left renal abscess/E. coli bacteremia Patient underwent imaging studies which showed findings consistent with pyelonephritis.  Left renal abscess was noted.  Urology was consulted who recommended  drainage by interventional radiology.  The abscess was aspirated by interventional radiology. Urine cultures with mixed flora however blood cultures positive for E. coli.  Patient was on ceftriaxone.  Discussed with Dr. Luciana Axe with infectious disease who recommends changing to Keflex.  Symptomatically patient has improved.  Continue to monitor.  Abnormal right upper quadrant ultrasound Sludge was noted in the gallbladder.  Patient was experiencing some abdominal pain in that area.  It is felt that most of her symptoms are due to pyelonephritis.  General surgery was consulted.  Patient underwent HIDA scan which was inconclusive as patient could not complete the study.  Unlikely her symptoms are due to a gallbladder process.  Outpatient follow-up.  Non-anion gap metabolic acidosis Stable.  Continue to monitor.  Hypokalemia and hypomagnesemia Potassium remains low.  Will be aggressively repleted.  We will also give her magnesium.  History of essential hypertension Continue with clonidine.  History of iron deficiency anemia Hemoglobin is stable.  Tobacco abuse Counseled.  DVT Prophylaxis: Lovenox    Code Status: Full code Family Communication: Discussed with the patient Disposition Plan: Mobilize.  Anticipate discharge 1-2 days.    LOS: 3 days   Osvaldo Shipper  [facility] Pager [phone] 08/07/2017, 12:54 PM  If 7PM-7AM, please contact night-coverage at [email or web address], password [facility]

## 2017-08-08 LAB — CBC
HCT: 29.3 % — ABNORMAL LOW (ref 36.0–46.0)
Hemoglobin: 10.1 g/dL — ABNORMAL LOW (ref 12.0–15.0)
MCH: 31 pg (ref 26.0–34.0)
MCHC: 34.5 g/dL (ref 30.0–36.0)
MCV: 89.9 fL (ref 78.0–100.0)
Platelets: 547 10*3/uL — ABNORMAL HIGH (ref 150–400)
RBC: 3.26 MIL/uL — ABNORMAL LOW (ref 3.87–5.11)
RDW: 14 % (ref 11.5–15.5)
WBC: 5.3 10*3/uL (ref 4.0–10.5)

## 2017-08-08 LAB — CULTURE, BLOOD (ROUTINE X 2)
Culture: NO GROWTH
Culture: NO GROWTH
Special Requests: ADEQUATE
Special Requests: ADEQUATE

## 2017-08-08 LAB — BASIC METABOLIC PANEL
Anion gap: 10 (ref 5–15)
BUN: <5 mg/dL — ABNORMAL LOW (ref 6–20)
CO2: 20 mmol/L — ABNORMAL LOW (ref 22–32)
Calcium: 8.7 mg/dL — ABNORMAL LOW (ref 8.9–10.3)
Chloride: 109 mmol/L (ref 101–111)
Creatinine, Ser: 0.58 mg/dL (ref 0.44–1.00)
GFR calc Af Amer: >60 mL/min (ref >60)
GFR calc non Af Amer: >60 mL/min (ref >60)
Glucose, Bld: 100 mg/dL — ABNORMAL HIGH (ref 65–99)
Potassium: 3 mmol/L — ABNORMAL LOW (ref 3.5–5.1)
Sodium: 139 mmol/L (ref 135–145)

## 2017-08-08 LAB — MAGNESIUM: Magnesium: 1.6 mg/dL — ABNORMAL LOW (ref 1.7–2.4)

## 2017-08-08 MED ORDER — POTASSIUM CHLORIDE CRYS ER 20 MEQ PO TBCR: 40.0000 meq | EXTENDED_RELEASE_TABLET | Freq: Every day | ORAL | 0 refills | Status: DC

## 2017-08-08 MED ORDER — HYDROCODONE-ACETAMINOPHEN 5-325 MG PO TABS: 1.0000 | ORAL_TABLET | ORAL | 0 refills | Status: DC | PRN

## 2017-08-08 MED ORDER — MAGNESIUM OXIDE 400 (241.3 MG) MG PO TABS: 400.0000 mg | ORAL_TABLET | Freq: Two times a day (BID) | ORAL | 0 refills | Status: AC

## 2017-08-08 MED ORDER — POTASSIUM CHLORIDE CRYS ER 20 MEQ PO TBCR
40.0000 meq | EXTENDED_RELEASE_TABLET | ORAL | Status: AC
  Administered 2017-08-08 (×2): 40 meq via ORAL
  Filled 2017-08-08 (×2): qty 2

## 2017-08-08 MED ORDER — PANTOPRAZOLE SODIUM 40 MG PO TBEC: 40.0000 mg | DELAYED_RELEASE_TABLET | Freq: Every day | ORAL | 0 refills | Status: DC

## 2017-08-08 MED ORDER — CEPHALEXIN 500 MG PO CAPS
500.0000 mg | ORAL_CAPSULE | Freq: Four times a day (QID) | ORAL | 0 refills | Status: AC
Start: 2017-08-08 — End: 2017-08-22

## 2017-08-08 MED ORDER — SACCHAROMYCES BOULARDII 250 MG PO CAPS: 250.0000 mg | ORAL_CAPSULE | Freq: Two times a day (BID) | ORAL | 0 refills | Status: DC

## 2017-08-08 NOTE — Discharge Instructions (Signed)
E. Coli Infection E. coli (Escherichia coli) are bacteria that can cause an infection in various parts of your body, including your intestines. E. coli bacteria normally live in the intestines of people and animals. Most types of E. coli do not cause infections, but some produce a poison (toxin) that can cause diarrhea. Depending on the toxin, this can cause mild or severe diarrhea. This condition can spread from person to person (is contagious). Toxin-producing E. coli can also spread from animals to humans. Most cases of E. coli infection come from cows (cattle). In some cases, this infection can cause a dangerous complication called hemolytic uremic syndrome (HUS). What are the causes? Causes of an E. coli intestinal infection include:  Eating raw or undercooked beef from infected cattle.  Touching an infected animal and then touching your mouth.  Eating fruits or vegetables that have come in contact with the feces of infected animals.  Drinking fluids that have been contaminated with E. coli from infected animals.  Coming into contact with a surface that has been contaminated by an infected person.  What increases the risk? This condition is more likely to develop in people who:  Eat raw or undercooked beef.  Drink raw (unpasteurized) milk, cider, or juice.  Are in close contact with cattle, goats, or sheep.  What are the signs or symptoms? Symptoms of this condition usually start about 3-4 days after the bacteria are swallowed (ingested). Symptoms include:  Severe cramps and tenderness in the abdomen.  Diarrhea. This may be watery or bloody.  Nausea and vomiting.  Dehydration. Dehydration can cause fatigue, thirst, a dry mouth, and less frequent urination.  Low fever. This is not common.  How is this diagnosed? This condition is diagnosed with a medical history and physical exam. A stool sample may be taken and tested for E. coli or toxins of E. coli. How is this  treated? Treatment for this condition includes rest and fluids (supportive care). If you have severe diarrhea, you may need to receive fluids through an IV tube. Symptoms of E. coli intestinal infection usually go away in 5-10 days. Some strains of E. coli may be treated with antibiotic or antidiarrheal medicines. However, this treatment is rarely used because these medicines may increase your risk for HUS. Follow these instructions at home: Eating and drinking  Drink enough fluids to keep your urine clear or pale yellow. You may need to drink small amounts of clear liquids frequently.  Ask your health care provider for specific rehydration instructions.  Do not drink milk, caffeine, or alcohol.  Eat small, frequent meals rather than large meals. General instructions  Take over-the-counter and prescription medicines only as told by your health care provider.  Wash your hands thoroughly before and after you prepare food and after you go to the bathroom (use the toilet). Make sure people who live with you also wash their hands often. If soap and water are not available, use hand sanitizer.  Clean surfaces that you touch with a product that contains chlorine bleach.  Keep all follow-up visits as told by your health care provider. This is important. How is this prevented?  Wash your hands often with soap and water. If soap and water are not available, use hand sanitizer. Always wash your hands: ? After you go to the bathroom. ? Before you touch food. ? After you prepare or cook beef. ? After you touch animals at farms, zoos, or fairs.  Do not eat raw or undercooked beef.    Do not drink unpasteurized milk or eat cheeses that were made with raw milk. Do not drink unpasteurized apple cider.  Wash cutting boards, counters, and utensils after you prepare raw meat.  Wash all fruits and vegetables before you eat or cook them. Contact a health care provider if:  Your symptoms do not get  better with treatment.  You have new symptoms.  Your vomiting or diarrhea gets worse. Get help right away if:  You have increasing pain or tenderness in your abdomen.  You have ongoing (persistent) vomiting or diarrhea.  You have abdominal pain that stays in one area (localizes).  Your diarrhea has more blood in it.  You have a fever.  You cannot eat or drink without vomiting.  You have signs of dehydration or HUS, such as: ? Pale skin. ? Dark urine, very little urine, or no urine. ? Cracked lips. ? Not making tears while crying. ? Dry mouth. ? Sunken eyes. ? Sleepiness. ? Weakness. ? Dizziness. This information is not intended to replace advice given to you by your health care provider. Make sure you discuss any questions you have with your health care provider. Document Released: 05/23/2005 Document Revised: 02/02/2016 Document Reviewed: 02/28/2015 [employer] Interactive Patient Education  2018 [employer]  

## 2017-08-08 NOTE — Discharge Summary (Signed)
[facility]  Physician Discharge Summary   Patient ID: Shirley [name] MRN: [id number] DOB/AGE: Jan 24, 1976 41 y.o.  Admit date: 08/03/2017 Discharge date: 08/08/2017  PCP: [facility]  DISCHARGE DIAGNOSES:  Principal Problem:   Bacteremia Active Problems:   Essential hypertension   Anemia   Pyelonephritis   Hypokalemia   Tobacco use   RECOMMENDATIONS FOR OUTPATIENT FOLLOW UP: 1. Outpatient follow-up with PCP within 1 week  DISCHARGE CONDITION: fair  Diet recommendation: Low-sodium  Filed Weights   08/03/17 1742  Weight: 56.7 kg (125 lb)    INITIAL HISTORY: 41 year old female with past medical history of hypertension, recurrent UTIs, GERD presented with complains of nausea vomiting flank pain.  She was found to have findings suggestive of pyelonephritis.  Imaging study showed a small left renal abscess.  She was seen by interventional radial and underwent drainage of this abscess.  Patient also seen by urology and general surgery  Consultants: General surgery.  Urology.  Interventional radiology  Procedures: Aspiration of the left renal abscess 11/27   HOSPITAL COURSE:   Acute pyelonephritis involving both kidneys with left renal abscess/E. coli bacteremia and Sepsis, present on admission  Patient underwent imaging studies which showed findings consistent with pyelonephritis.  Left renal abscess was noted. Urology was consulted who recommended drainage by interventional radiology.  The abscess was aspirated by interventional radiology. Urine cultures with mixed flora however blood cultures positive for E. coli.    No growth from the aspirated fluid.  Patient was initially placed on ceftriaxone.  Discussed with Dr. Luciana Axe with infectious disease who recommended changing to Keflex.  Symptomatically patient has improved.  She is tolerating her diet.  Pain has improved.  Antibiotics for 2 weeks.  Abnormal right upper quadrant  ultrasound Sludge was noted in the gallbladder.  Patient was experiencing some abdominal pain in that area.  It is felt that most of her symptoms are due to pyelonephritis.  General surgery was consulted.  Patient underwent HIDA scan which was inconclusive as patient could not complete the study.  Unlikely her symptoms are due to a gallbladder process.  Outpatient follow-up.  Non-anion gap metabolic acidosis Improved.  Hypokalemia and hypomagnesemia Potassium and magnesium were aggressively repleted.  Potassium level has improved.  She will be discharged on oral potassium.  History of essential hypertension Continue home medication  History of iron deficiency anemia Hemoglobin is stable.  Tobacco abuse Counseled.  Stable for discharge.    PERTINENT LABS:  The results of significant diagnostics from this hospitalization (including imaging, microbiology, ancillary and laboratory) are listed below for reference.    Microbiology: Recent Results (from the past 240 hour(s))  Culture, blood (Routine x 2)     Status: Abnormal   Collection Time: 08/02/17  5:03 PM  Result Value Ref Range Status   Specimen Description BLOOD RIGHT ANTECUBITAL  Final   Special Requests   Final    BOTTLES DRAWN AEROBIC AND ANAEROBIC Blood Culture adequate volume   Culture  Setup Time   Final    GRAM NEGATIVE RODS IN BOTH AEROBIC AND ANAEROBIC BOTTLES CRITICAL RESULT CALLED TO, READ BACK BY AND VERIFIED WITH: S GOUGE,RN AT 4098 08/03/17 BY L BENFIELD    Culture ESCHERICHIA COLI (A)  Final   Report Status 08/05/2017 FINAL  Final   Organism ID, Bacteria ESCHERICHIA COLI  Final      Susceptibility   Escherichia coli - MIC*    AMPICILLIN <=2 SENSITIVE Sensitive     CEFAZOLIN <=  4 SENSITIVE Sensitive     CEFEPIME <=1 SENSITIVE Sensitive     CEFTAZIDIME <=1 SENSITIVE Sensitive     CEFTRIAXONE <=1 SENSITIVE Sensitive     CIPROFLOXACIN <=0.25 SENSITIVE Sensitive     GENTAMICIN <=1 SENSITIVE Sensitive      IMIPENEM <=0.25 SENSITIVE Sensitive     TRIMETH/SULFA <=20 SENSITIVE Sensitive     AMPICILLIN/SULBACTAM <=2 SENSITIVE Sensitive     PIP/TAZO <=4 SENSITIVE Sensitive     Extended ESBL NEGATIVE Sensitive     * ESCHERICHIA COLI  Blood Culture ID Panel (Reflexed)     Status: Abnormal   Collection Time: 08/02/17  5:03 PM  Result Value Ref Range Status   Enterococcus species NOT DETECTED NOT DETECTED Final   Listeria monocytogenes NOT DETECTED NOT DETECTED Final   Staphylococcus species NOT DETECTED NOT DETECTED Final   Staphylococcus aureus NOT DETECTED NOT DETECTED Final   Streptococcus species NOT DETECTED NOT DETECTED Final   Streptococcus agalactiae NOT DETECTED NOT DETECTED Final   Streptococcus pneumoniae NOT DETECTED NOT DETECTED Final   Streptococcus pyogenes NOT DETECTED NOT DETECTED Final   Acinetobacter baumannii NOT DETECTED NOT DETECTED Final   Enterobacteriaceae species DETECTED (A) NOT DETECTED Final    Comment: Enterobacteriaceae represent a large family of gram-negative bacteria, not a single organism. CRITICAL RESULT CALLED TO, READ BACK BY AND VERIFIED WITH: S GOUGE,RN AT [id number] 08/03/17 BY L BENFIELD    Enterobacter cloacae complex NOT DETECTED NOT DETECTED Final   Escherichia coli DETECTED (A) NOT DETECTED Final    Comment: CRITICAL RESULT CALLED TO, READ BACK BY AND VERIFIED WITH: S GOUGE,RN AT [id number] 08/03/17 BY L BENFIELD    Klebsiella oxytoca NOT DETECTED NOT DETECTED Final   Klebsiella pneumoniae NOT DETECTED NOT DETECTED Final   Proteus species NOT DETECTED NOT DETECTED Final   Serratia marcescens NOT DETECTED NOT DETECTED Final   Carbapenem resistance NOT DETECTED NOT DETECTED Final   Haemophilus influenzae NOT DETECTED NOT DETECTED Final   Neisseria meningitidis NOT DETECTED NOT DETECTED Final   Pseudomonas aeruginosa NOT DETECTED NOT DETECTED Final   Candida albicans NOT DETECTED NOT DETECTED Final   Candida glabrata NOT DETECTED NOT DETECTED Final    Candida krusei NOT DETECTED NOT DETECTED Final   Candida parapsilosis NOT DETECTED NOT DETECTED Final   Candida tropicalis NOT DETECTED NOT DETECTED Final  Culture, blood (Routine x 2)     Status: Abnormal   Collection Time: 08/02/17  5:46 PM  Result Value Ref Range Status   Specimen Description BLOOD RIGHT ANTECUBITAL  Final   Special Requests   Final    BOTTLES DRAWN AEROBIC AND ANAEROBIC Blood Culture adequate volume   Culture  Setup Time   Final    GRAM NEGATIVE RODS IN BOTH AEROBIC AND ANAEROBIC BOTTLES CRITICAL RESULT CALLED TO, READ BACK BY AND VERIFIED WITH: S GOUGE,RN AT 1004 08/03/17 BY L BENFIELD    Culture (A)  Final    ESCHERICHIA COLI SUSCEPTIBILITIES PERFORMED ON PREVIOUS CULTURE WITHIN THE LAST 5 DAYS.    Report Status 08/05/2017 FINAL  Final  Urine culture     Status: Abnormal   Collection Time: 08/02/17  8:08 PM  Result Value Ref Range Status   Specimen Description URINE, RANDOM  Final   Special Requests URINE, CLEAN CATCH  Final   Culture MULTIPLE SPECIES PRESENT, SUGGEST RECOLLECTION (A)  Final   Report Status 08/04/2017 FINAL  Final  Blood culture (routine x 2)     Status: None   Collection  Time: 08/03/17  3:36 PM  Result Value Ref Range Status   Specimen Description BLOOD RIGHT ANTECUBITAL  Final   Special Requests   Final    BOTTLES DRAWN AEROBIC AND ANAEROBIC Blood Culture adequate volume   Culture NO GROWTH 5 DAYS  Final   Report Status 08/08/2017 FINAL  Final  Blood culture (routine x 2)     Status: None   Collection Time: 08/03/17  3:38 PM  Result Value Ref Range Status   Specimen Description BLOOD LEFT HAND  Final   Special Requests   Final    BOTTLES DRAWN AEROBIC AND ANAEROBIC Blood Culture adequate volume   Culture NO GROWTH 5 DAYS  Final   Report Status 08/08/2017 FINAL  Final  Aerobic/Anaerobic Culture (surgical/deep wound)     Status: None (Preliminary result)   Collection Time: 08/06/17  5:00 PM  Result Value Ref Range Status   Specimen  Description WOUND  Final   Special Requests RENAL ABSCESS  Final   Gram Stain   Final    RARE WBC PRESENT, PREDOMINANTLY PMN NO ORGANISMS SEEN    Culture NO GROWTH 2 DAYS  Final   Report Status PENDING  Incomplete     Labs: Basic Metabolic Panel: Recent Labs  Lab 08/03/17 1158 08/04/17 0719 08/05/17 0625 08/06/17 0721 08/07/17 0521 08/08/17 0453  NA  --  139 137 138 138 139  K  --  3.9 3.2* 3.5 2.8* 3.0*  CL  --  115* 114* 112* 110 109  CO2  --  15* 17* 17* 19* 20*  GLUCOSE  --  91 99 98 97 100*  BUN  --  8 5* <5* <5* <5*  CREATININE  --  0.74 0.66 0.48 0.54 0.58  CALCIUM  --  8.1* 7.9* 8.1* 8.5* 8.7*  MG 2.2  --   --   --  1.4* 1.6*   Liver Function Tests: Recent Labs  Lab 08/02/17 1703 08/03/17 1109 08/04/17 1006  AST 28 20 22   ALT 34 28 24  ALKPHOS 115 111 85  BILITOT 1.0 0.6 0.5  PROT 7.4 7.0 5.4*  ALBUMIN 3.4* 3.1* 2.3*   Recent Labs  Lab 08/02/17 1740  LIPASE 24   CBC: Recent Labs  Lab 08/02/17 1703 08/03/17 1109 08/04/17 0719 08/05/17 0625 08/06/17 0721 08/07/17 0521 08/08/17 0453  WBC 19.7* 20.1* 12.6* 6.1 5.7 6.7 5.3  NEUTROABS 17.9* 18.5*  --   --   --   --   --   HGB 11.5* 10.7* 10.3* 9.0* 10.4* 10.0* 10.1*  HCT 33.5* 30.8* 30.2* 26.9* 30.1* 28.9* 29.3*  MCV 93.1 91.9 92.6 92.1 91.5 90.0 89.9  PLT 326 345 332 351 380 452* 547*     IMAGING STUDIES Dg Chest 2 View  Result Date: 08/02/2017 CLINICAL DATA:  Chest pain, fever, chills. EXAM: CHEST  2 VIEW COMPARISON:  Radiograph of May 09, 2017. FINDINGS: The heart size and mediastinal contours are within normal limits. Both lungs are clear. No pneumothorax or pleural effusion is noted. The visualized skeletal structures are unremarkable. IMPRESSION: No active cardiopulmonary disease. Electronically Signed   By: Lupita Raider, M.D.   On: 08/02/2017 17:23   Ct Head Wo Contrast  Result Date: 08/02/2017 CLINICAL DATA:  Lightheadedness and headache for 3 days. EXAM: CT HEAD WITHOUT  CONTRAST TECHNIQUE: Contiguous axial images were obtained from the base of the skull through the vertex without intravenous contrast. COMPARISON:  Head CT scan 05/09/2017 02/12/2007. FINDINGS: Brain: Appears normal without  hemorrhage, infarct, mass lesion, mass effect, midline shift or abnormal extra-axial fluid collection. No hydrocephalus or pneumocephalus. Vascular: Negative. Skull: Intact. Sinuses/Orbits: Negative. Other: None. IMPRESSION: Normal head CT. Electronically Signed   By: Drusilla Kannerhomas  Dalessio M.D.   On: 08/02/2017 19:08   Nm Hepatobiliary Liver Func  Result Date: 08/04/2017 CLINICAL DATA:  Gallbladder wall thickening and gallbladder sludge seen on ultrasound August 03, 2017 assess for acute cholecystitis EXAM: NUCLEAR MEDICINE HEPATOBILIARY IMAGING TECHNIQUE: Sequential images of the abdomen were obtained out to 60 minutes following intravenous administration of radiopharmaceutical. RADIOPHARMACEUTICALS:  5.2 mCi Tc-367m  Choletec IV COMPARISON:  CT scan August 02, 2017 and ultrasound August 03, 2017 FINDINGS: There is prompt uptake of radiotracer in the liver with prompt excretion into the small bowel and reflux into the stomach. No filling of the gallbladder by the end of the first hour of imaging. The patient refused to continue the study after the first hour and morphine was not utilized IMPRESSION: 1. The gallbladder did not fill within the first hour of imaging. The patient refused to continue the study and morphine was not utilized. As a result, the findings are nonspecific. Acute cholecystitis is not excluded or confirmed on this incomplete study. Electronically Signed   By: Gerome Samavid  Williams III M.D   On: 08/04/2017 20:38   Ct Abdomen Pelvis W Contrast  Result Date: 08/05/2017 CLINICAL DATA:  Pyelonephritis. Nausea vomiting and diarrhea. Abdominal pain and fever. EXAM: CT ABDOMEN AND PELVIS WITH CONTRAST TECHNIQUE: Multidetector CT imaging of the abdomen and pelvis was performed  using the standard protocol following bolus administration of intravenous contrast. CONTRAST:  100mL ISOVUE-300 IOPAMIDOL (ISOVUE-300) INJECTION 61% COMPARISON:  08/02/2017 FINDINGS: Lower chest:  Unremarkable. Hepatobiliary: No focal abnormality within the liver parenchyma. Mild periportal edema evident. Gallbladder is distended with layering sludge or non mineralized tiny stones. There is pericholecystic fluid. No intra or extrahepatic biliary duct dilatation. Pancreas: No focal mass lesion. No dilatation of the main duct. No intraparenchymal cyst. No peripancreatic edema. Spleen: No splenomegaly. No focal mass lesion. Adrenals/Urinary Tract: No adrenal nodule or mass. Multiple areas of segmental hypoperfusion are identified in the kidneys bilaterally. Interpolar left kidney demonstrates a 1.3 x 1.7 cm area of irregular fluid density (image 20 series 8). Hypoperfusion with edema is fairly marked in the inferior pole of the right kidney. No evidence for hydroureteronephrosis. Bladder is nondistended Stomach/Bowel: Stomach is nondistended. No gastric wall thickening. No evidence of outlet obstruction. Duodenum is normally positioned as is the ligament of Treitz. No small bowel wall thickening. No small bowel dilatation. The terminal ileum is normal. The appendix is normal. No gross colonic mass. No colonic wall thickening. No substantial diverticular change. Vascular/Lymphatic: No abdominal aortic aneurysm. No abdominal aortic atherosclerotic calcification. There is no gastrohepatic or hepatoduodenal ligament lymphadenopathy. No intraperitoneal or retroperitoneal lymphadenopathy. No pelvic sidewall lymphadenopathy. Reproductive: The uterus has normal CT imaging appearance. There is no adnexal mass. Other: Small volume free fluid in the cul-de-sac is similar to prior. Musculoskeletal: Tiny epigastric midline ventral hernia contains fat and a trace amount of fluid. Gas in the subcutaneous fat of the lower left  anterior abdominal wall is likely due to an injection site. The Bone windows reveal no worrisome lytic or sclerotic osseous lesions. IMPRESSION: 1. Interval evolution of bilateral pyelonephritis with 1.3 x 1.7 cm intrarenal phlegmon or abscess in the interpolar left kidney. Areas of segmental edema/ hypoperfusion in both kidneys appear progressive concerning for worsening condition. 2. Mild periportal edema in the liver with  some fluid around the gallbladder fossa. Small volume fluid in the cul-de-sac is similar to prior. 3. Layering sludge or non mineralized stones in the gallbladder. Electronically Signed   By: Kennith Center M.D.   On: 08/05/2017 16:51   Ct Abdomen Pelvis W Contrast  Result Date: 08/02/2017 CLINICAL DATA:  Lower abdominal pain beginning 2 days ago, greater in the right lower quadrant today. EXAM: CT ABDOMEN AND PELVIS WITH CONTRAST TECHNIQUE: Multidetector CT imaging of the abdomen and pelvis was performed using the standard protocol following bolus administration of intravenous contrast. CONTRAST:  ISOVUE-300 IOPAMIDOL (ISOVUE-300) INJECTION 61% COMPARISON:  04/02/2017 FINDINGS: Lower chest: Minimal dependent atelectasis in the lung bases. No pleural effusion. Hepatobiliary: No focal liver abnormality is seen. The gallbladder is moderately distended with possible mild wall thickening. No calcified gallstones are identified, although there may be subtly hyperattenuating material dependently in the gallbladder which could reflect sludge. No biliary dilatation. Pancreas: Unremarkable. Spleen: Unremarkable. Adrenals/Urinary Tract: Unremarkable adrenal glands. There is a 1.6 cm well-defined low density focus laterally in the interpolar left kidney which is new. Additional patchy areas of more hypoenhancement are present in both kidneys, with the most extensive involvement being in the lower pole on the right. There is mild perinephric stranding inferiorly on the right. There is no  hydronephrosis. The bladder is unremarkable. Stomach/Bowel: The stomach is within normal limits. There is no evidence of bowel obstruction. The appendix is not confidently identified, however no significant inflammatory changes are seen about the cecum to strongly suggest acute appendicitis. Vascular/Lymphatic: Minimal atherosclerotic calcification of the distal abdominal aorta. No enlarged lymph nodes. Reproductive: Unremarkable uterus and right adnexa. Suspected left ovarian corpus luteum. Other: Small volume pelvic free fluid. Unchanged 14 mm hypodense focus in the upper anterior abdominal wall which may reflect fluid in a small ventral hernia. Musculoskeletal: No acute osseous abnormality or suspicious osseous lesion. IMPRESSION: 1. Patchy hypoenhancement in both kidneys consistent with pyelonephritis. Possible developing 1.6 cm abscess in the left kidney. 2. Distended gallbladder with possible mild wall thickening. Right upper quadrant ultrasound could be performed further evaluation as clinically warranted. No calcified gallstones or biliary dilatation. Electronically Signed   By: Sebastian Ache M.D.   On: 08/02/2017 19:26   Ct Image Guided Drainage By Percutaneous Catheter  Result Date: 08/06/2017 CLINICAL DATA:  Left renal abscess EXAM: CT IMAGE GUIDED DRAINAGE BY PERCUTANEOUS CATHETER FLUOROSCOPY TIME:  None minutes MEDICATIONS AND MEDICAL HISTORY: Versed Three mg, Fentanyl 150 mcg. Additional Medications: None. ANESTHESIA/SEDATION: Moderate sedation time: 13 minutes CONTRAST:  None PROCEDURE: The procedure, risks, benefits, and alternatives were explained to the patient. Questions regarding the procedure were encouraged and answered. The patient understands and consents to the procedure. The back in the prone position was prepped with ChloraPrep in a sterile fashion, and a sterile drape was applied covering the operative field. A sterile gown and sterile gloves were used for the procedure. Under CT  guidance, an 18 gauge needle was inserted into the left renal abscess in the interpolar region. Aspiration yielded 2 drops bloody fluid. This was sent for culture FINDINGS: Imaging documents needle placement in the ill-defined phlegmon versus abscess within the left kidney. COMPLICATIONS: None IMPRESSION: Successful aspiration of a left renal abscess yielding bloody fluid Electronically Signed   By: Jolaine Click M.D.   On: 08/06/2017 17:19   US Abdomen Limited Ruq  Result Date: 08/03/2017 CLINICAL DATA:  Patient with abdominal pain and vomiting. EXAM: ULTRASOUND ABDOMEN LIMITED RIGHT UPPER QUADRANT COMPARISON:  CT  abdomen 08/02/2017 FINDINGS: Gallbladder: Sludge within the gallbladder lumen. Mild gallbladder wall thickening. Negative sonographic Murphy's sign. No significant pericholecystic fluid. Common bile duct: Diameter: 4 mm Liver: No focal lesion identified. Within normal limits in parenchymal echogenicity. Portal vein is patent on color Doppler imaging with normal direction of blood flow towards the liver. IMPRESSION: Sludge within the gallbladder lumen. Gallbladder wall thickening. Findings are indeterminate for acute cholecystitis. Consider further evaluation with HIDA scan as clinically indicated. Electronically Signed   By: Annia Belt M.D.   On: 08/03/2017 19:34    DISCHARGE EXAMINATION: Vitals:   08/07/17 2023 08/07/17 2114 08/08/17 0453 08/08/17 1356  BP: (!) 191/99 130/81 (!) 144/76 (!) 145/86  Pulse: (!) 120 (!) 56 65 62  Resp: 18 16 16 16   Temp: 99.1 F (37.3 C) 98.6 F (37 C) 98.6 F (37 C) 98.8 F (37.1 C)  TempSrc: Oral Oral Oral Oral  SpO2: 100% 100% 100% 100%  Weight:      Height:       General appearance: alert, cooperative, appears stated age and no distress Resp: clear to auscultation bilaterally Cardio: regular rate and rhythm, S1, S2 normal, no murmur, click, rub or gallop GI: soft, non-tender; bowel sounds normal; no masses,  no organomegaly  DISPOSITION:  Home  Discharge Instructions    Call MD for:  extreme fatigue   Complete by:  As directed    Call MD for:  persistant dizziness or light-headedness   Complete by:  As directed    Call MD for:  persistant nausea and vomiting   Complete by:  As directed    Call MD for:  severe uncontrolled pain   Complete by:  As directed    Call MD for:  temperature >100.4   Complete by:  As directed    Diet general   Complete by:  As directed    Discharge instructions   Complete by:  As directed    Please take your antibiotics as prescribed.  Please be sure to follow-up with your primary care provider within 1 week.  Seek attention immediately if fevers recur or if you experience severe pain with nausea and vomiting  You were cared for by a hospitalist during your hospital stay. If you have any questions about your discharge medications or the care you received while you were in the hospital after you are discharged, you can call the unit and asked to speak with the hospitalist on call if the hospitalist that took care of you is not available. Once you are discharged, your primary care physician will handle any further medical issues. Please note that NO REFILLS for any discharge medications will be authorized once you are discharged, as it is imperative that you return to your primary care physician (or establish a relationship with a primary care physician if you do not have one) for your aftercare needs so that they can reassess your need for medications and monitor your lab values. If you do not have a primary care physician, you can call [phone] for a physician referral.   Increase activity slowly   Complete by:  As directed       ALLERGIES:  Allergies  Allergen Reactions  . Lisinopril Anaphylaxis    Feet, hands, and throat became swollen  . Aspirin Nausea Only    Makes stomach burn  . Ibuprofen Nausea Only    Makes stomach burn     Allergies as of 08/08/2017      Reactions  Lisinopril  Anaphylaxis   Feet, hands, and throat became swollen   Aspirin Nausea Only   Makes stomach burn   Ibuprofen Nausea Only   Makes stomach burn      Medication List    STOP taking these medications   oxyCODONE-acetaminophen 5-325 MG tablet Commonly known as:  PERCOCET     TAKE these medications   acetaminophen 500 MG tablet Commonly known as:  TYLENOL Take 1,000 mg by mouth every 6 (six) hours as needed for moderate pain or headache.   albuterol 108 (90 Base) MCG/ACT inhaler Commonly known as:  PROVENTIL HFA;VENTOLIN HFA Inhale 2 puffs into the lungs every 6 (six) hours as needed for wheezing or shortness of breath.   cephALEXin 500 MG capsule Commonly known as:  KEFLEX Take 1 capsule (500 mg total) by mouth every 6 (six) hours for 14 days.   cloNIDine 0.2 MG tablet Commonly known as:  CATAPRES Take 0.2 mg by mouth 2 (two) times daily.   diphenhydrAMINE 25 MG tablet Commonly known as:  BENADRYL Take 25 mg by mouth every 6 (six) hours as needed for allergies.   HYDROcodone-acetaminophen 5-325 MG tablet Commonly known as:  NORCO/VICODIN Take 1-2 tablets by mouth every 4 (four) hours as needed for moderate pain.   magnesium oxide 400 (241.3 Mg) MG tablet Commonly known as:  MAG-OX Take 1 tablet (400 mg total) by mouth 2 (two) times daily for 10 days.   ondansetron 8 MG disintegrating tablet Commonly known as:  ZOFRAN-ODT Take 8 mg by mouth every 8 (eight) hours as needed for nausea or vomiting.   pantoprazole 40 MG tablet Commonly known as:  PROTONIX Take 1 tablet (40 mg total) by mouth daily.   polyethylene glycol powder powder Commonly known as:  MIRALAX 1 capful daily until normal bowel movements resume What changed:    how to take this  when to take this  reasons to take this  additional instructions   potassium chloride SA 20 MEQ tablet Commonly known as:  K-DUR,KLOR-CON Take 2 tablets (40 mEq total) by mouth daily for 5 days.   saccharomyces  boulardii 250 MG capsule Commonly known as:  FLORASTOR Take 1 capsule (250 mg total) by mouth 2 (two) times daily.        Follow-up Information    [facility]. Schedule an appointment as soon as possible for a visit in 1 week(s).   Contact information: [address] [place] [address] [phone]           TOTAL DISCHARGE TIME: 35 mins  Osvaldo Shipper  [facility] Pager [phone]  08/08/2017, 4:07 PM

## 2017-08-08 NOTE — Progress Notes (Signed)
[name] discharged per MD order. Discussed with the patient and all questions fully answered.  VSS, Skin clean, dry and intact without evidence of skin break down, no evidence of skin tears noted.  IV catheter discontinued intact. Site without signs and symptoms of complications. Dressing and pressure applied.  An After Visit Summary was printed and given to the patient. Patient received prescription.  Discharge education completed with patient/family including follow up instructions, medication list, d/c activities limitations if indicated, with other d/c instructions as indicated by MD - patient able to verbalize understanding, all questions fully answered.   Patient instructed to return to ED, call 911, or call MD for any changes in condition.   Patient to be escorted via WC, and D/C home via private auto.

## 2017-08-11 LAB — AEROBIC/ANAEROBIC CULTURE (SURGICAL/DEEP WOUND): Culture: NO GROWTH

## 2017-11-19 ENCOUNTER — Emergency Department (HOSPITAL_COMMUNITY)
Admission: EM | Admit: 2017-11-19 | Discharge: 2017-11-20 | Disposition: A | Discharge status: Home / Self Care | Payer: BLUE CROSS/BLUE SHIELD | Attending: Emergency Medicine | Admitting: Emergency Medicine

## 2017-11-19 ENCOUNTER — Encounter (HOSPITAL_COMMUNITY): Payer: Self-pay | Admitting: Emergency Medicine

## 2017-11-19 ENCOUNTER — Other Ambulatory Visit: Payer: Self-pay

## 2017-11-19 DIAGNOSIS — I10 Essential (primary) hypertension: Secondary | ICD-10-CM | POA: Diagnosis not present

## 2017-11-19 DIAGNOSIS — R1084 Generalized abdominal pain: Secondary | ICD-10-CM | POA: Insufficient documentation

## 2017-11-19 DIAGNOSIS — G43A0 Cyclical vomiting, not intractable: Secondary | ICD-10-CM | POA: Diagnosis not present

## 2017-11-19 DIAGNOSIS — F17210 Nicotine dependence, cigarettes, uncomplicated: Secondary | ICD-10-CM | POA: Insufficient documentation

## 2017-11-19 DIAGNOSIS — Z79899 Other long term (current) drug therapy: Secondary | ICD-10-CM | POA: Insufficient documentation

## 2017-11-19 DIAGNOSIS — R112 Nausea with vomiting, unspecified: Secondary | ICD-10-CM | POA: Diagnosis present

## 2017-11-19 DIAGNOSIS — R1115 Cyclical vomiting syndrome unrelated to migraine: Secondary | ICD-10-CM

## 2017-11-19 LAB — COMPREHENSIVE METABOLIC PANEL
ALT: 14 U/L (ref 14–54)
AST: 19 U/L (ref 15–41)
Albumin: 4.2 g/dL (ref 3.5–5.0)
Alkaline Phosphatase: 41 U/L (ref 38–126)
Anion gap: 10 (ref 5–15)
BUN: 8 mg/dL (ref 6–20)
CO2: 21 mmol/L — ABNORMAL LOW (ref 22–32)
Calcium: 9.3 mg/dL (ref 8.9–10.3)
Chloride: 107 mmol/L (ref 101–111)
Creatinine, Ser: 0.76 mg/dL (ref 0.44–1.00)
GFR calc Af Amer: >60 mL/min (ref >60)
GFR calc non Af Amer: >60 mL/min (ref >60)
Glucose, Bld: 94 mg/dL (ref 65–99)
Potassium: 3.5 mmol/L (ref 3.5–5.1)
Sodium: 138 mmol/L (ref 135–145)
Total Bilirubin: 0.5 mg/dL (ref 0.3–1.2)
Total Protein: 7.1 g/dL (ref 6.5–8.1)

## 2017-11-19 LAB — CBC
HCT: 36.4 % (ref 36.0–46.0)
Hemoglobin: 12.2 g/dL (ref 12.0–15.0)
MCH: 30.8 pg (ref 26.0–34.0)
MCHC: 33.5 g/dL (ref 30.0–36.0)
MCV: 91.9 fL (ref 78.0–100.0)
Platelets: 376 10*3/uL (ref 150–400)
RBC: 3.96 MIL/uL (ref 3.87–5.11)
RDW: 13.1 % (ref 11.5–15.5)
WBC: 8.2 10*3/uL (ref 4.0–10.5)

## 2017-11-19 LAB — URINALYSIS, ROUTINE W REFLEX MICROSCOPIC
Bacteria, UA: NONE SEEN
Bilirubin Urine: NEGATIVE
Glucose, UA: NEGATIVE mg/dL
Ketones, ur: NEGATIVE mg/dL
Nitrite: NEGATIVE
Protein, ur: NEGATIVE mg/dL
Specific Gravity, Urine: 1.009 (ref 1.005–1.030)
pH: 7 (ref 5.0–8.0)

## 2017-11-19 LAB — I-STAT TROPONIN, ED: Troponin i, poc: 0 ng/mL (ref 0.00–0.08)

## 2017-11-19 LAB — LIPASE, BLOOD: Lipase: 27 U/L (ref 11–51)

## 2017-11-19 MED ORDER — ONDANSETRON 4 MG PO TBDP
4.0000 mg | ORAL_TABLET | Freq: Once | ORAL | Status: AC | PRN
  Administered 2017-11-19: 4 mg via ORAL

## 2017-11-20 ENCOUNTER — Emergency Department (HOSPITAL_COMMUNITY): Payer: BLUE CROSS/BLUE SHIELD

## 2017-11-20 LAB — I-STAT BETA HCG BLOOD, ED (MC, WL, AP ONLY): I-stat hCG, quantitative: <5 m[IU]/mL (ref <5)

## 2017-11-20 IMAGING — CR DG ABDOMEN ACUTE W/ 1V CHEST
3 series · 3 of 3 positions shown · non-contrast
Comparison: CT of the abdomen and pelvis performed [DATE], and
chest radiograph performed [DATE]

CLINICAL DATA: Subacute onset of left-sided chest pain and right
upper quadrant abdominal pain. Vomiting and diarrhea.

EXAM:
DG ABDOMEN ACUTE W/ 1V CHEST

[chest pa]
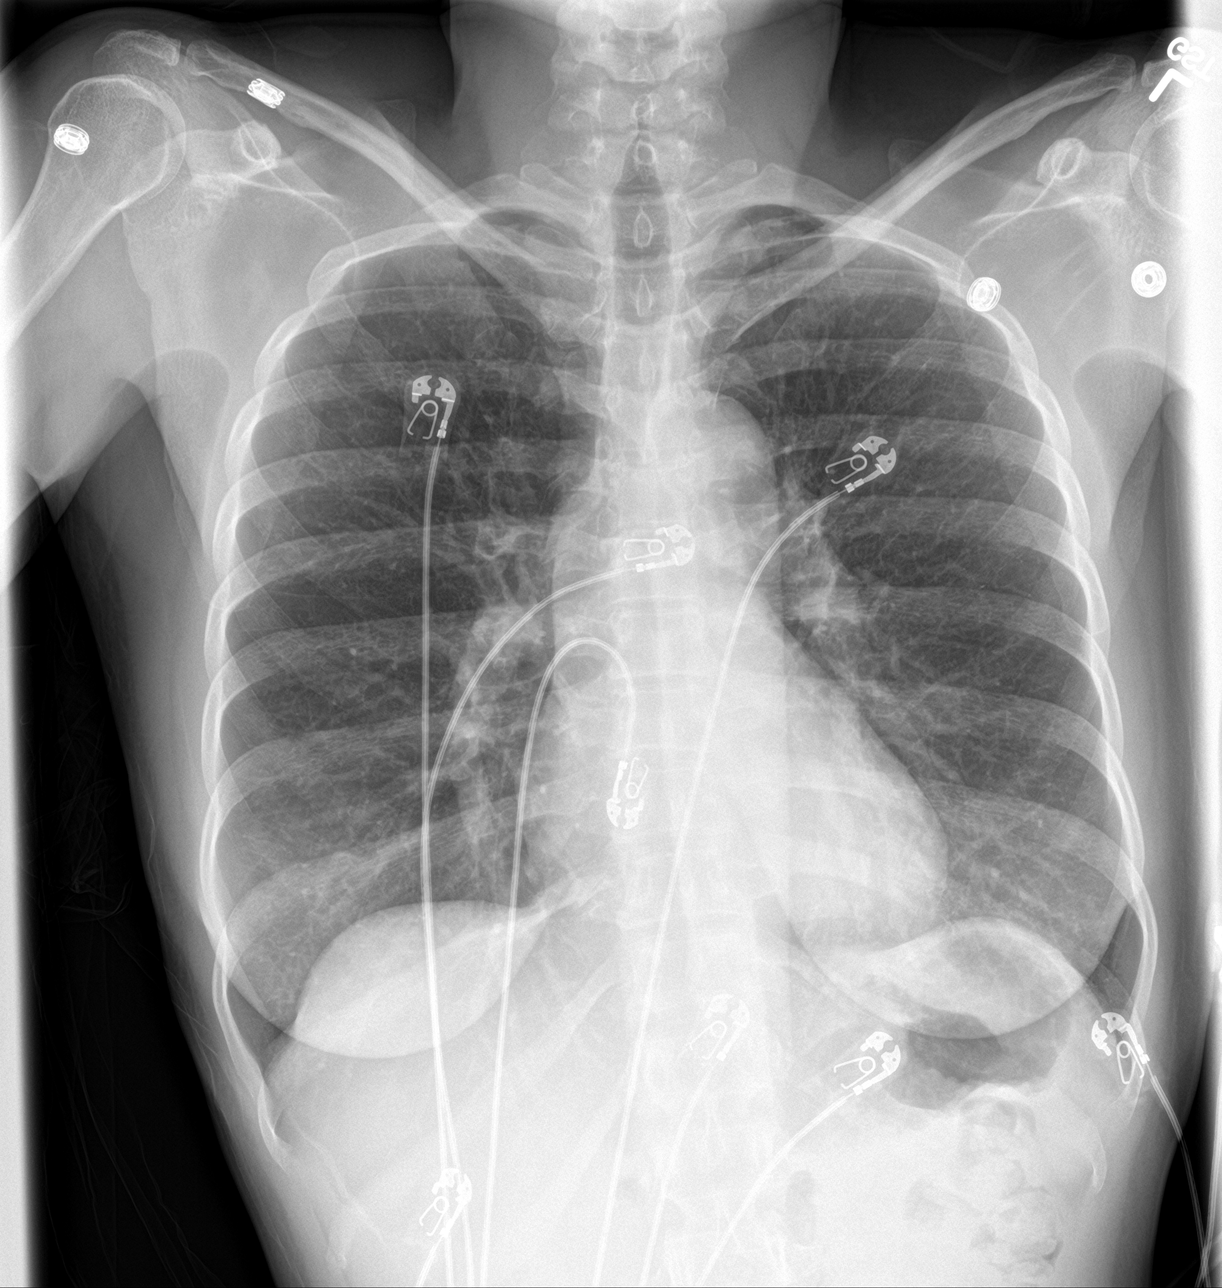

[abdomen erect]
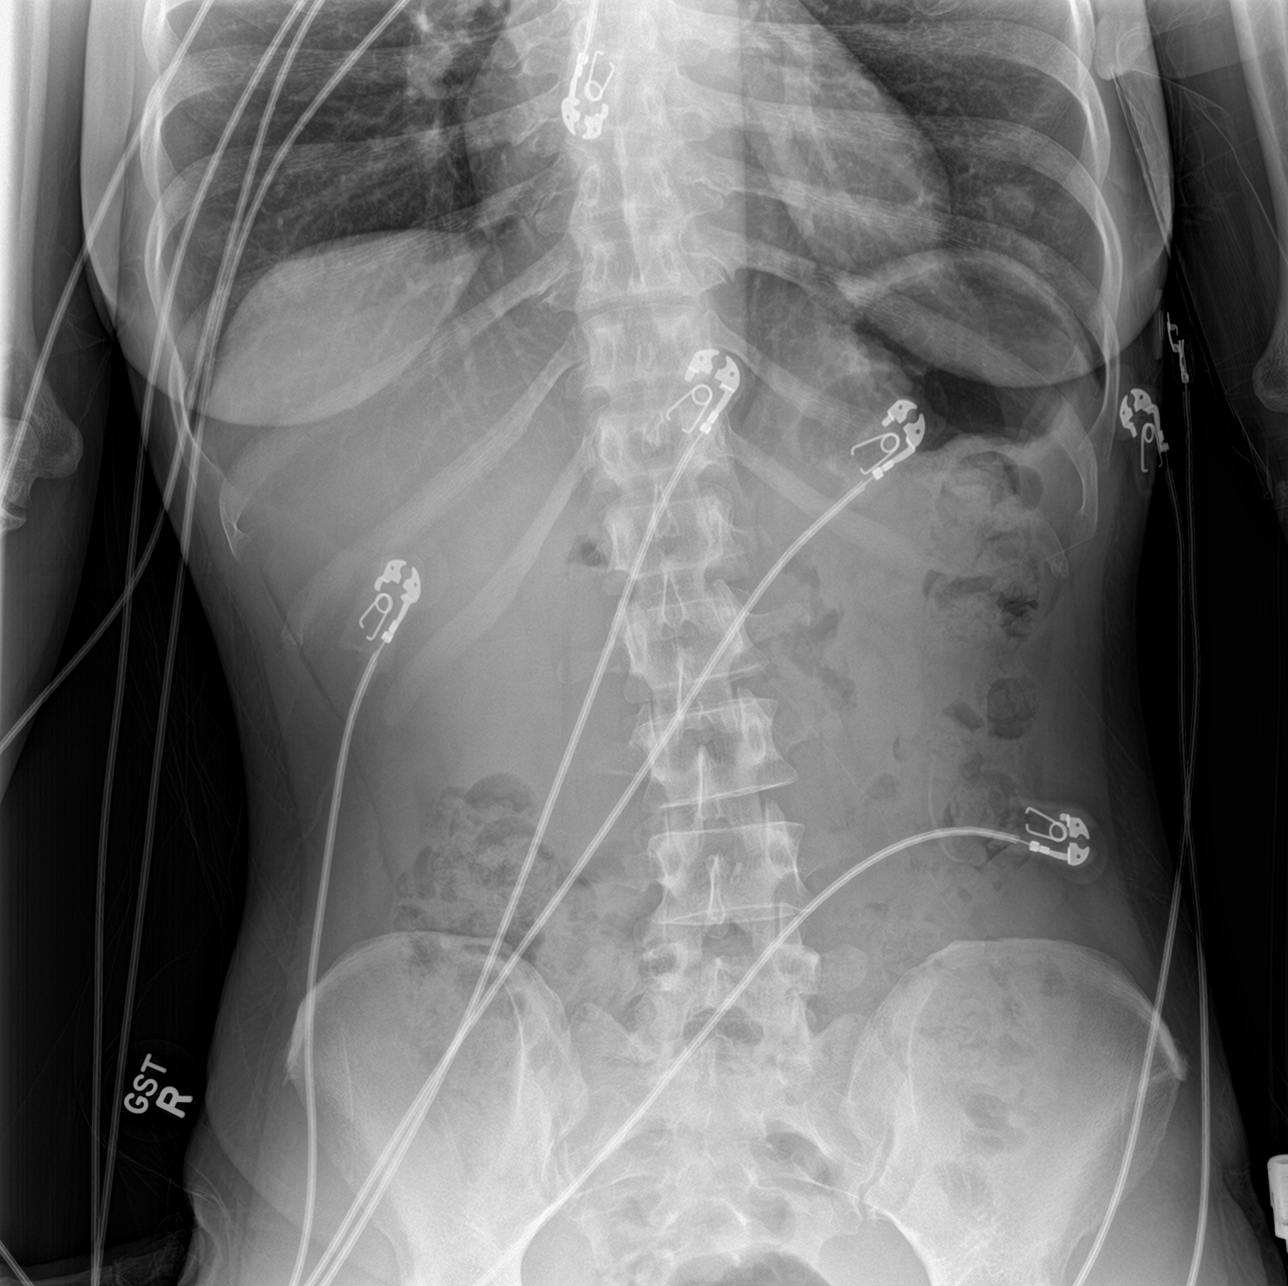

[abdomen supine]
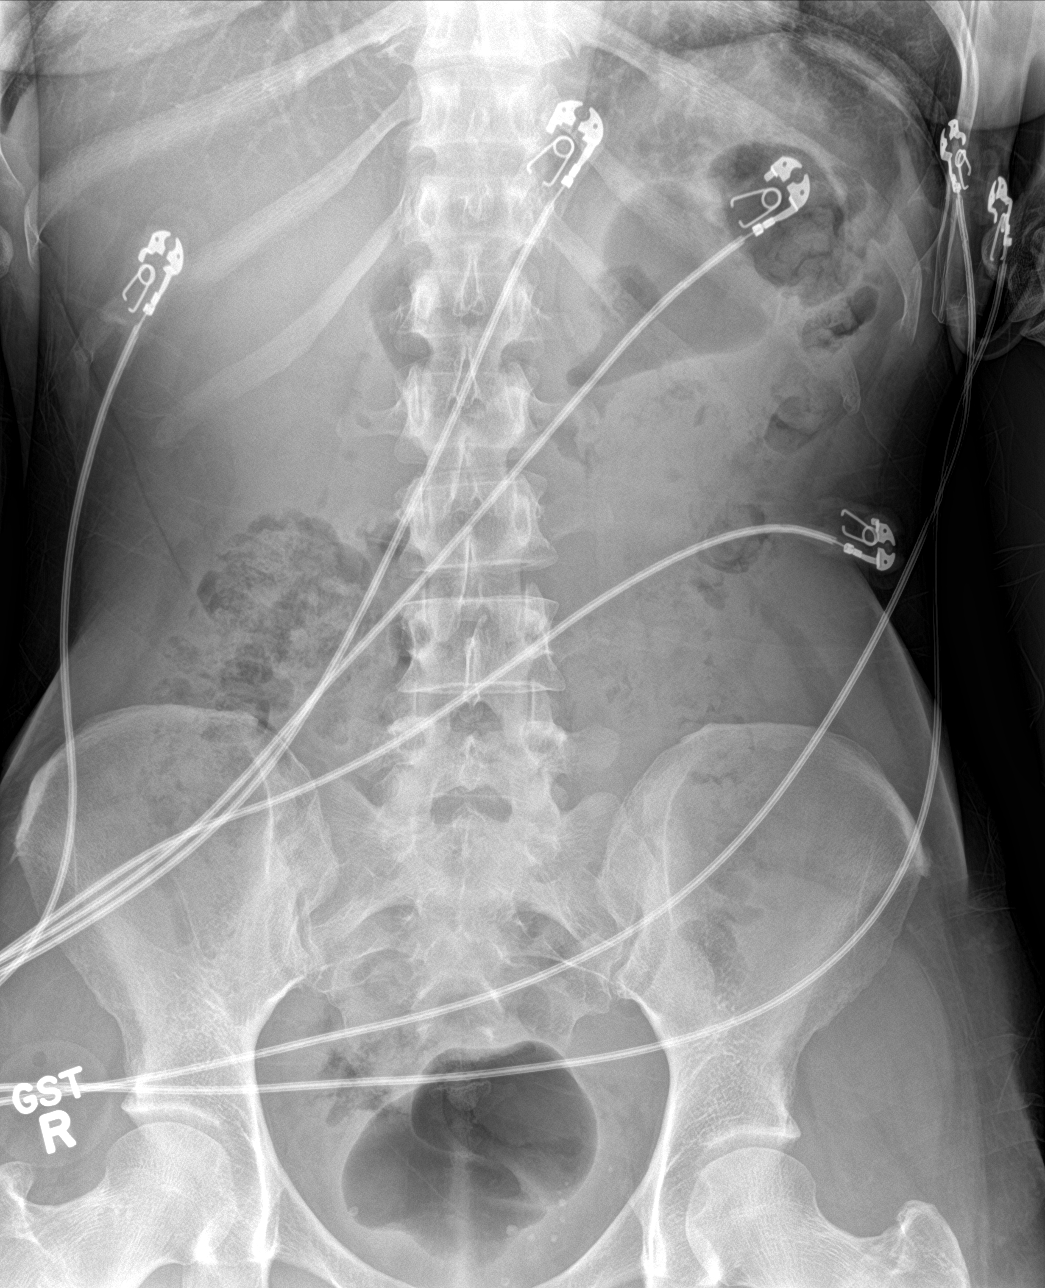

[3 of 3 positions shown; findings below may reference images not displayed]

FINDINGS: The lungs are well-aerated and clear. There is no evidence of focal
opacification, pleural effusion or pneumothorax. The
cardiomediastinal silhouette is within normal limits.

The visualized bowel gas pattern is unremarkable. Scattered stool
and air are seen within the colon; there is no evidence of small
bowel dilatation to suggest obstruction. No free intra-abdominal air
is identified on the provided upright view.

No acute osseous abnormalities are seen; the sacroiliac joints are
unremarkable in appearance.
IMPRESSION: 1. Unremarkable bowel gas pattern; no free intra-abdominal air seen.
Small to moderate amount of stool noted in the colon.
2. No acute cardiopulmonary process seen.

## 2017-11-20 IMAGING — CT CT ABD-PELV W/ CM
2 of 5 series · 16 of 46 positions shown, 18 images · IV contrast (APPLIED)
Comparison: CT abdomen pelvis [DATE]

CLINICAL DATA: Sharp abdominal pain while eating.

EXAM:
CT ABDOMEN AND PELVIS WITH CONTRAST
TECHNIQUE: Multidetector CT imaging of the abdomen and pelvis was performed
using the standard protocol following bolus administration of
intravenous contrast.
CONTRAST:  100mL [R4] IOPAMIDOL ([R4]) INJECTION 61%

[Series 3: abdomen 5.0 · axial · 0.63mm/px · z∈[+879,+1254]mm · 13 of 87 slices shown, 15 images]
[im 6/87  soft-tissue]
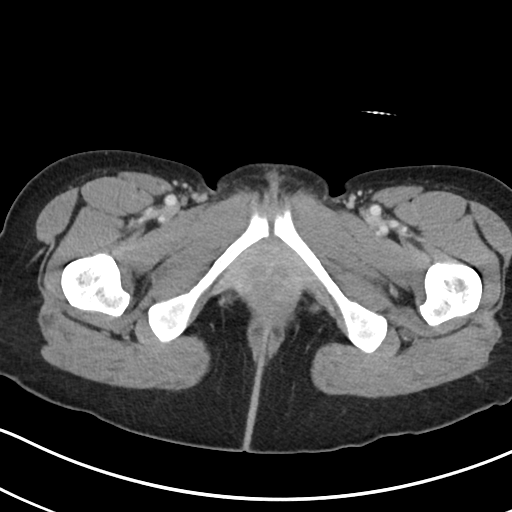
[im 6/87  bone]
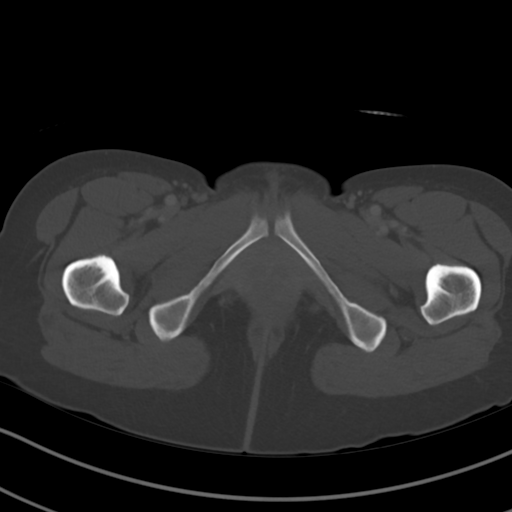
[im 11/87  soft-tissue]
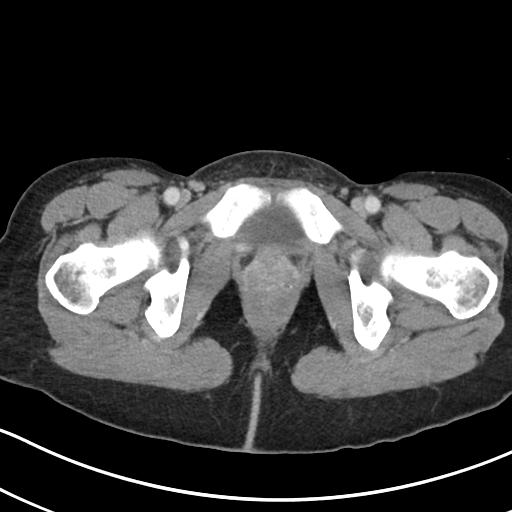
[im 17/87  soft-tissue]
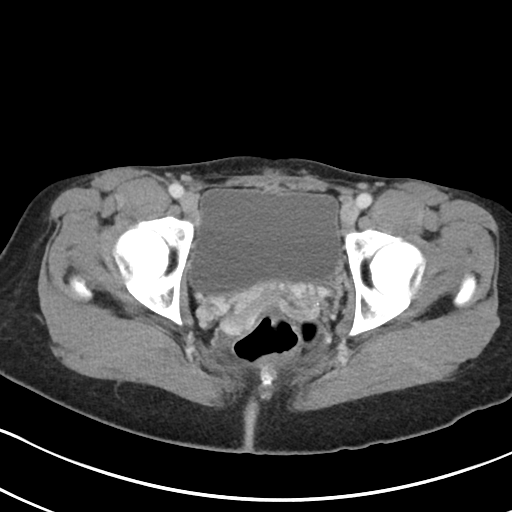
[im 27/87  soft-tissue]
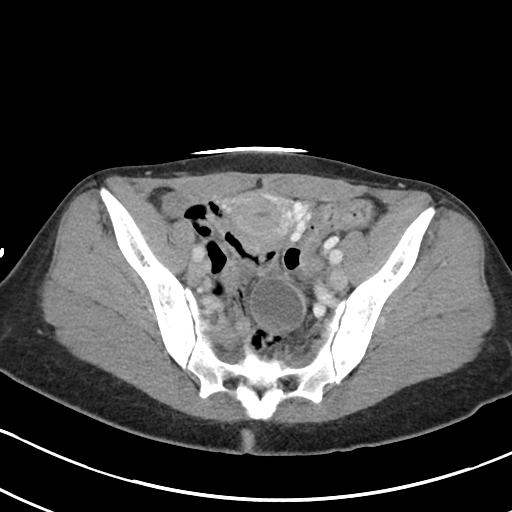
[im 33/87  soft-tissue]
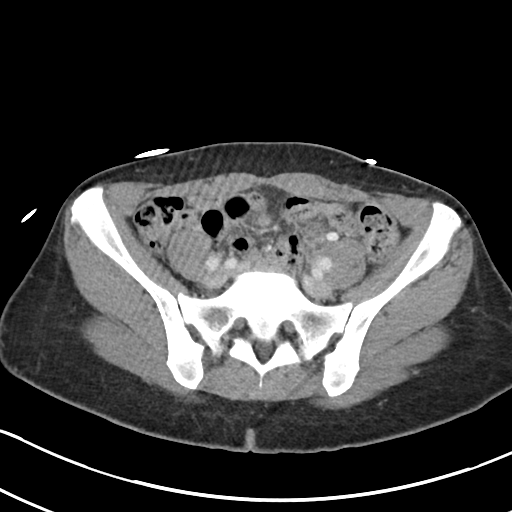
[im 38/87  soft-tissue]
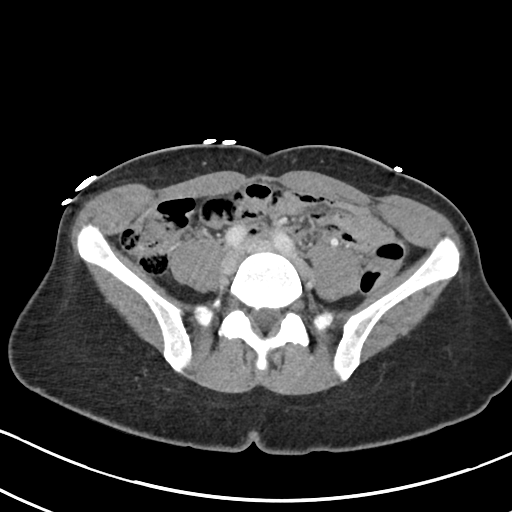
[im 44/87  soft-tissue]
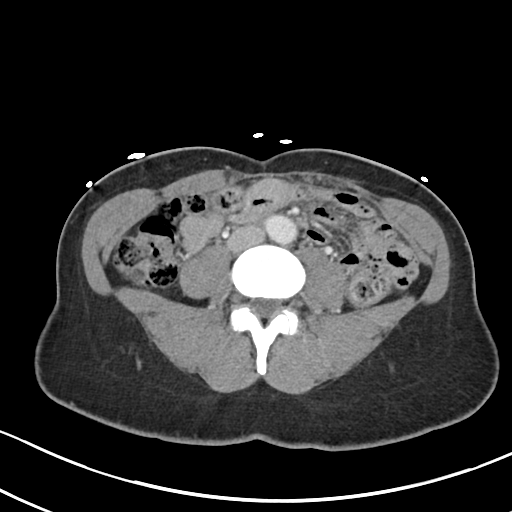
[im 49/87  soft-tissue]
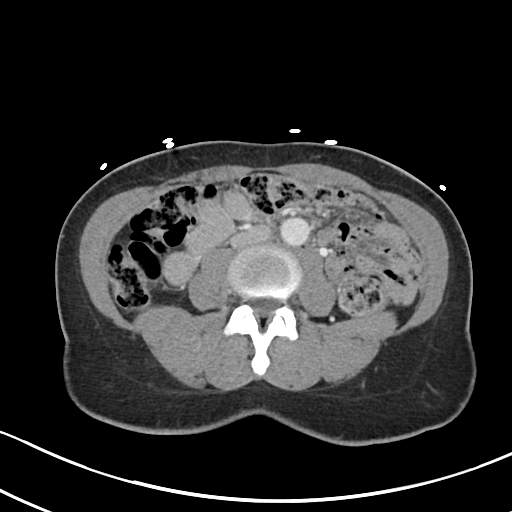
[im 54/87  soft-tissue]
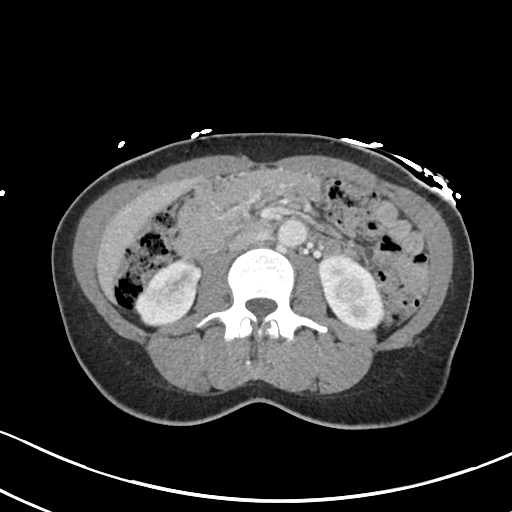
[im 54/87  bone]
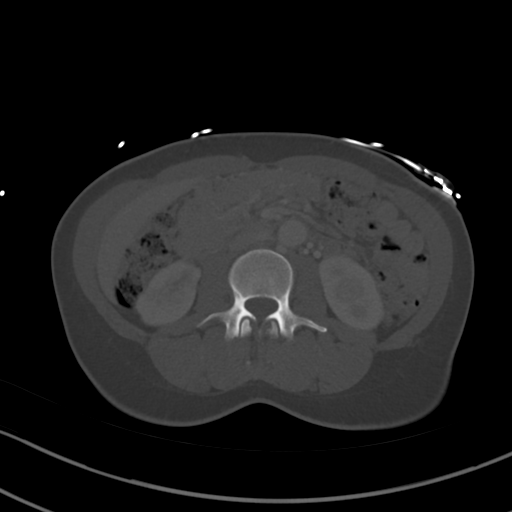
[im 60/87  soft-tissue]
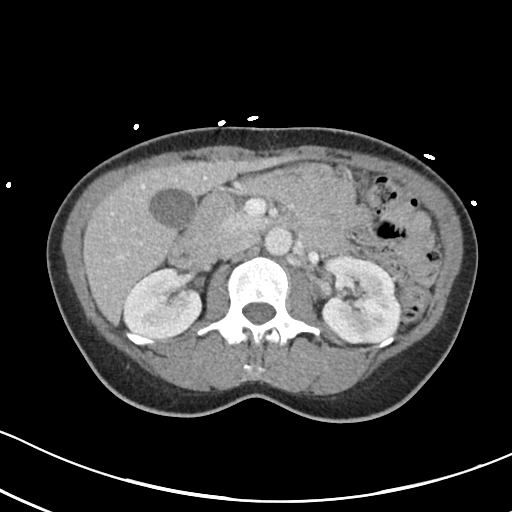
[im 70/87  soft-tissue]
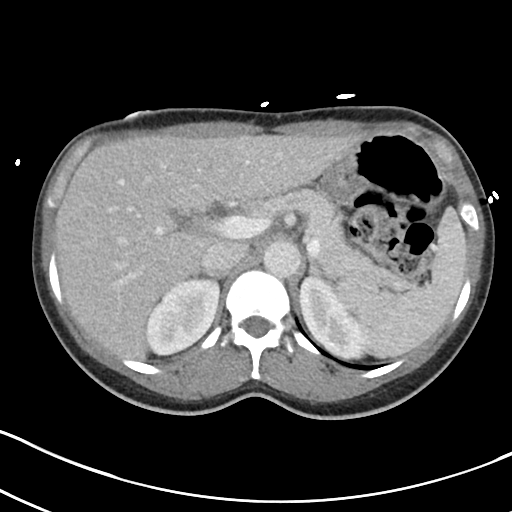
[im 76/87  soft-tissue]
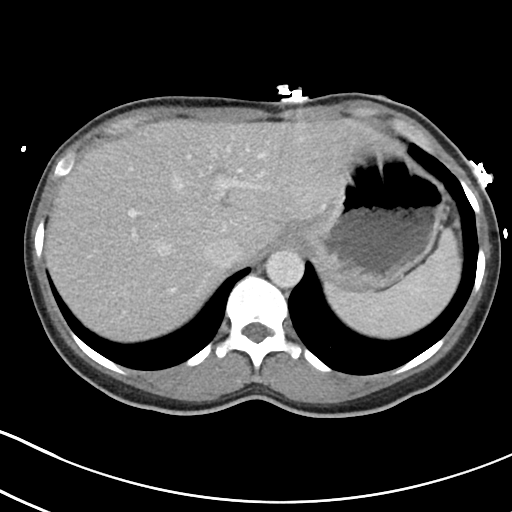
[im 81/87  soft-tissue]
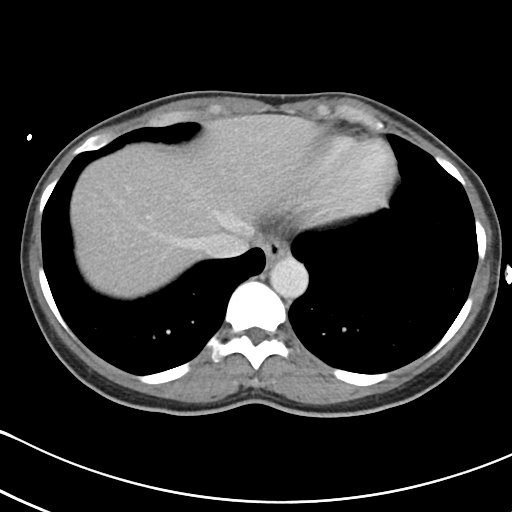

[Series 6: abdomen 3.0 mpr cor · coronal · 0.65mm/px · 3 of 74 slices shown]
[im 25/74  soft-tissue]
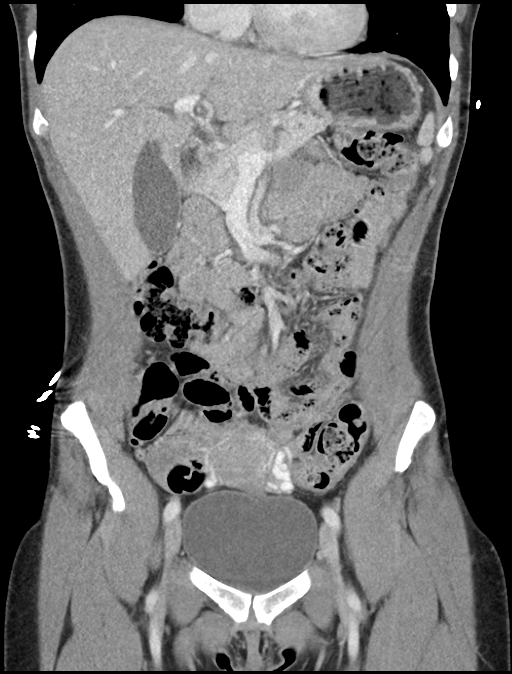
[im 33/74  soft-tissue]
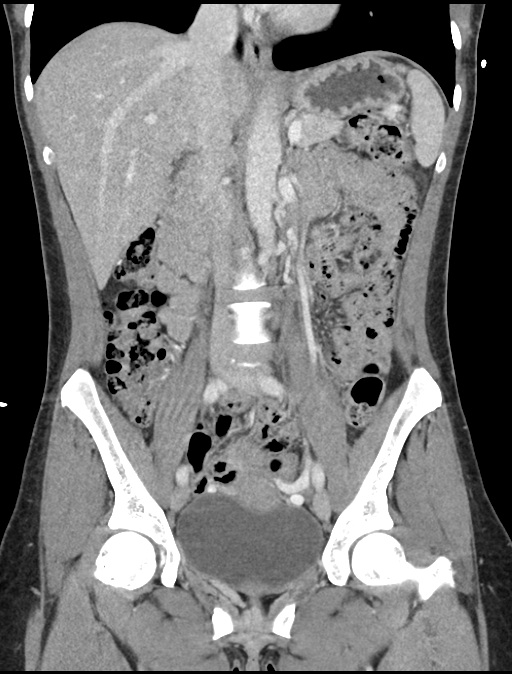
[im 41/74  soft-tissue]
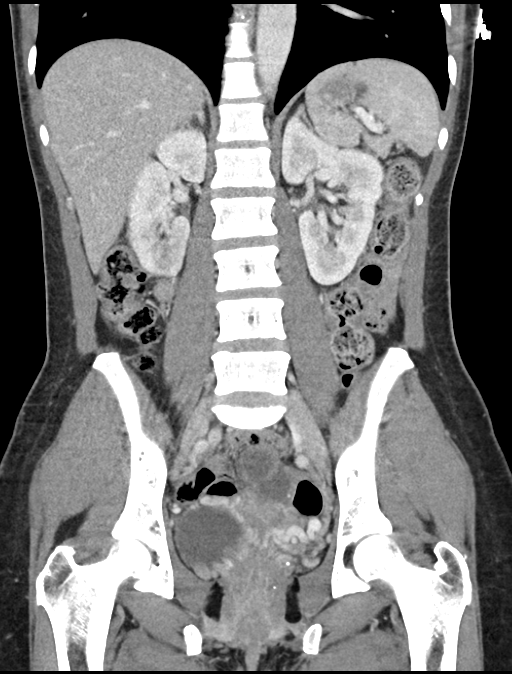

[16 of 46 positions shown; findings below may reference images not displayed]

FINDINGS: Lower chest: No basilar pulmonary nodules or pleural effusion. No
apical pericardial effusion.

Hepatobiliary: Normal hepatic contours and density. No visible
biliary dilatation. Normal gallbladder.

Pancreas: Normal parenchymal contours without ductal dilatation. No
peripancreatic fluid collection.

Spleen: Normal.

Adrenals/Urinary Tract:

--Adrenal glands: Normal.

--Right kidney/ureter: No hydronephrosis, perinephric stranding or
nephrolithiasis. No obstructing ureteral stones.

--Left kidney/ureter: No hydronephrosis, perinephric stranding or
nephrolithiasis. No obstructing ureteral stones.

--Urinary bladder: Normal appearance for the degree of distention.

Stomach/Bowel:

--Stomach/Duodenum: No hiatal hernia or other gastric abnormality.
Normal duodenal course.

--Small bowel: No dilatation or inflammation.

--Colon: No focal abnormality.

--Appendix: Normal.

Vascular/Lymphatic: Normal course and caliber of the major abdominal
vessels. Single focus of aortic atherosclerotic calcification. No
abdominal or pelvic lymphadenopathy.

Reproductive: 3.5 cm left ovarian cyst.  Normal uterus.

Musculoskeletal. No bony spinal canal stenosis or focal osseous
abnormality.

Other: None.
IMPRESSION: No acute abdominal or pelvic abnormality.

## 2017-11-20 MED ORDER — SUCRALFATE 1 GM/10ML PO SUSP: 1.0000 g | Freq: Three times a day (TID) | ORAL | 0 refills | Status: DC

## 2017-11-20 MED ORDER — FENTANYL CITRATE (PF) 100 MCG/2ML IJ SOLN
100.0000 ug | Freq: Once | INTRAMUSCULAR | Status: AC
  Administered 2017-11-20: 100 ug via INTRAVENOUS
  Filled 2017-11-20: qty 2

## 2017-11-20 MED ORDER — ONDANSETRON HCL 4 MG/2ML IJ SOLN
4.0000 mg | Freq: Once | INTRAMUSCULAR | Status: AC
  Administered 2017-11-20: 4 mg via INTRAVENOUS
  Filled 2017-11-20: qty 2

## 2017-11-20 MED ORDER — IOPAMIDOL (ISOVUE-300) INJECTION 61%
INTRAVENOUS | Status: AC
  Administered 2017-11-20: 100 mL
  Filled 2017-11-20: qty 100

## 2017-11-20 MED ORDER — ONDANSETRON 8 MG PO TBDP: ORAL_TABLET | ORAL | 0 refills | Status: DC

## 2017-11-20 NOTE — ED Provider Notes (Signed)
[facility] EMERGENCY DEPARTMENT Provider Note   CSN: [id number] Arrival date & time: 11/19/17  1920    History   Chief Complaint Chief Complaint  Patient presents with  . Abdominal Pain  . Emesis    HPI Shirley [name] is a 42 y.o. female.  The history is provided by the patient.  Abdominal Pain   This is a new problem. The current episode started more than 1 week ago. The problem occurs daily. The problem has been rapidly worsening. The pain is located in the generalized abdominal region. The pain is severe. Associated symptoms include diarrhea, nausea and vomiting. Pertinent negatives include fever, melena and dysuria. The symptoms are aggravated by certain positions and palpation. Nothing relieves the symptoms.  Emesis   Associated symptoms include abdominal pain and diarrhea. Pertinent negatives include no fever.  With history of hypertension presents with diffuse abdominal pain she reports the pain started about 2 weeks ago and then tonight it became abruptly worse.  On the way to the hospital she had vomiting.  She reports it was nonbloody.  She also reports while waiting she began having some chest pain, that is now improving.  No fevers reported.  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  Past Medical History:  Diagnosis Date  . Anemia   . Bacteremia   . Blood dyscrasia    has to apply a lot of pressure to stop bleeding  . GERD (gastroesophageal reflux disease)   . Heart murmur    with pregnancy  . History of hiatal hernia   . Hypertension    4-5 years ago, no meds  . Kidney infection    patient states I might have a kidney infection  . Migraine    migraines  . Pyelonephritis     Patient Active Problem List   Diagnosis Date Noted  . Pyelonephritis 08/03/2017  . Bacteremia 08/03/2017  . Hypokalemia 08/03/2017  . Tobacco use 08/03/2017  . Acute postoperative pain 07/31/2016  . Menometrorrhagia 04/09/2016  . Anemia 04/09/2016  .  Abnormal uterine bleeding (AUB) 04/09/2016  . Essential hypertension 11/17/2009    Past Surgical History:  Procedure Laterality Date  . ABDOMINAL HERNIA REPAIR    . CESAREAN SECTION     x 4  . CESAREAN SECTION     x 4  . ENDOMETRIAL ABLATION  07/31/2016  . HYSTEROSCOPY  07/31/2016   Procedure: HYSTEROSCOPY WITH HYDROTHERMAL ABLATION;  Surgeon: Bison Bing, MD;  Location: [facility];  Service: Gynecology;;    OB History    Gravida Para Term Preterm AB Living   5 4 3 1 1 4    SAB TAB Ectopic Multiple Live Births                  Obstetric Comments   c--section x 4. 36wk c-section x 1       Home Medications    Prior to Admission medications   Medication Sig Start Date End Date Taking? Authorizing Provider  acetaminophen (TYLENOL) 500 MG tablet Take 1,000 mg by mouth every 6 (six) hours as needed for moderate pain or headache.    [provider]  albuterol (PROVENTIL HFA;VENTOLIN HFA) 108 (90 Base) MCG/ACT inhaler Inhale 2 puffs into the lungs every 6 (six) hours as needed for wheezing or shortness of breath.    [provider]  cloNIDine (CATAPRES) 0.2 MG tablet Take 0.2 mg by mouth 2 (two) times daily.     [provider]  diphenhydrAMINE (BENADRYL) 25 MG tablet Take 25 mg by mouth every 6 (six) hours as needed for allergies.    [provider]  HYDROcodone-acetaminophen (NORCO/VICODIN) 5-325 MG tablet Take 1-2 tablets by mouth every 4 (four) hours as needed for moderate pain. 08/08/17   Osvaldo Shipper, MD  ondansetron (ZOFRAN-ODT) 8 MG disintegrating tablet Take 8 mg by mouth every 8 (eight) hours as needed for nausea or vomiting.    [provider]  pantoprazole (PROTONIX) 40 MG tablet Take 1 tablet (40 mg total) by mouth daily. 08/08/17   Osvaldo Shipper, MD  polyethylene glycol powder [facility]) powder 1 capful daily until normal bowel movements resume Patient taking differently: Take by mouth daily as needed for mild constipation.   08/06/16   Marny Lowenstein, PA-C  potassium chloride SA (K-DUR,KLOR-CON) 20 MEQ tablet Take 2 tablets (40 mEq total) by mouth daily for 5 days. 08/08/17 08/13/17  Osvaldo Shipper, MD  saccharomyces boulardii (FLORASTOR) 250 MG capsule Take 1 capsule (250 mg total) by mouth 2 (two) times daily. 08/08/17   Osvaldo Shipper, MD    Family History Family History  Problem Relation Age of Onset  . Hypertension Mother   . Colon cancer Father 3  . Stroke Brother     Social History Social History   Tobacco Use  . Smoking status: Current Every Day Smoker    Packs/day: 0.50    Types: Cigarettes  . Smokeless tobacco: Never Used  Substance Use Topics  . Alcohol use: No    Comment: occasionally  . Drug use: Yes    Types: Marijuana     Allergies   Lisinopril; Aspirin; and Ibuprofen   Review of Systems Review of Systems  Constitutional: Negative for fever.  Cardiovascular: Positive for chest pain.  Gastrointestinal: Positive for abdominal pain, diarrhea, nausea and vomiting. Negative for melena.  Genitourinary: Negative for dysuria.  All other systems reviewed and are negative.    Physical Exam Updated Vital Signs BP (!) 169/117 (BP Location: Right Arm)   Pulse 92   Temp 98.9 F (37.2 C) (Oral)   Resp 18   Ht 1.575 m (5\' 2" )   SpO2 100%   BMI 22.86 kg/m   Physical Exam CONSTITUTIONAL: Well developed/well nourished, anxious HEAD: Normocephalic/atraumatic EYES: EOMI/PERRL ENMT: Mucous membranes moist NECK: supple no meningeal signs SPINE/BACK:entire spine nontender CV: S1/S2 noted, no murmurs/rubs/gallops noted LUNGS: Lungs are clear to auscultation bilaterally, no apparent distress ABDOMEN: soft, moderate diffuse tenderness with guarding noted,  bowel sounds noted throughout abdomen but decreased GU:no cva tenderness NEURO: Pt is awake/alert/appropriate, moves all extremitiesx4.  No facial droop.   EXTREMITIES: pulses normal/equal, full ROM SKIN: warm, color  normal PSYCH: Anxious ED Treatments / Results  Labs (all labs ordered are listed, but only abnormal results are displayed) Labs Reviewed  COMPREHENSIVE METABOLIC PANEL - Abnormal; Notable for the following components:      Result Value   CO2 21 (*)    All other components within normal limits  URINALYSIS, ROUTINE W REFLEX MICROSCOPIC - Abnormal; Notable for the following components:   Color, Urine STRAW (*)    Hgb urine dipstick MODERATE (*)    Leukocytes, UA TRACE (*)    Squamous Epithelial / LPF 0-5 (*)    All other components within normal limits  LIPASE, BLOOD  CBC  I-STAT BETA HCG BLOOD, ED ([facility], [facility], [facility] ONLY)  I-STAT TROPONIN, ED    EKG ED ECG REPORT   Date: 11/20/2017 0346  Rate: 82  Rhythm: normal sinus rhythm  QRS Axis: right  Intervals: normal  ST/T Wave abnormalities: t wave inversion  Conduction Disutrbances:none  Narrative Interpretation:   Old EKG Reviewed: unchanged  I have personally reviewed the EKG tracing and agree with the computerized printout as noted.  Radiology Ct Abdomen Pelvis W Contrast  Result Date: 11/20/2017 CLINICAL DATA:  Sharp abdominal pain while eating. EXAM: CT ABDOMEN AND PELVIS WITH CONTRAST TECHNIQUE: Multidetector CT imaging of the abdomen and pelvis was performed using the standard protocol following bolus administration of intravenous contrast. CONTRAST:  ISOVUE-300 IOPAMIDOL (ISOVUE-300) INJECTION 61% COMPARISON:  CT abdomen pelvis 08/05/2017 FINDINGS: Lower chest: No basilar pulmonary nodules or pleural effusion. No apical pericardial effusion. Hepatobiliary: Normal hepatic contours and density. No visible biliary dilatation. Normal gallbladder. Pancreas: Normal parenchymal contours without ductal dilatation. No peripancreatic fluid collection. Spleen: Normal. Adrenals/Urinary Tract: --Adrenal glands: Normal. --Right kidney/ureter: No hydronephrosis, perinephric stranding or nephrolithiasis. No obstructing ureteral stones. --Left  kidney/ureter: No hydronephrosis, perinephric stranding or nephrolithiasis. No obstructing ureteral stones. --Urinary bladder: Normal appearance for the degree of distention. Stomach/Bowel: --Stomach/Duodenum: No hiatal hernia or other gastric abnormality. Normal duodenal course. --Small bowel: No dilatation or inflammation. --Colon: No focal abnormality. --Appendix: Normal. Vascular/Lymphatic: Normal course and caliber of the major abdominal vessels. Single focus of aortic atherosclerotic calcification. No abdominal or pelvic lymphadenopathy. Reproductive: 3.5 cm left ovarian cyst.  Normal uterus. Musculoskeletal. No bony spinal canal stenosis or focal osseous abnormality. Other: None. IMPRESSION: No acute abdominal or pelvic abnormality. Electronically Signed   By: Deatra Robinson M.D.   On: 11/20/2017 04:15   Dg Abd Acute W/chest  Result Date: 11/20/2017 CLINICAL DATA:  Subacute onset of left-sided chest pain and right upper quadrant abdominal pain. Vomiting and diarrhea. EXAM: DG ABDOMEN ACUTE W/ 1V CHEST COMPARISON:  CT of the abdomen and pelvis performed 08/05/2017, and chest radiograph performed 08/02/2017 FINDINGS: The lungs are well-aerated and clear. There is no evidence of focal opacification, pleural effusion or pneumothorax. The cardiomediastinal silhouette is within normal limits. The visualized bowel gas pattern is unremarkable. Scattered stool and air are seen within the colon; there is no evidence of small bowel dilatation to suggest obstruction. No free intra-abdominal air is identified on the provided upright view. No acute osseous abnormalities are seen; the sacroiliac joints are unremarkable in appearance. IMPRESSION: 1. Unremarkable bowel gas pattern; no free intra-abdominal air seen. Small to moderate amount of stool noted in the colon. 2. No acute cardiopulmonary process seen. Electronically Signed   By: Roanna Raider M.D.   On: 11/20/2017 02:27    Procedures Procedures  Medications  Ordered in ED Medications  ondansetron (ZOFRAN-ODT) disintegrating tablet 4 mg (4 mg Oral Given 11/19/17 2035)  fentaNYL (SUBLIMAZE) injection 100 mcg (100 mcg Intravenous Given 11/20/17 0205)  ondansetron (ZOFRAN) injection 4 mg (4 mg Intravenous Given 11/20/17 0205)  fentaNYL (SUBLIMAZE) injection 100 mcg (100 mcg Intravenous Given 11/20/17 0332)  iopamidol (ISOVUE-300) 61 % injection (100 mLs  Contrast Given 11/20/17 0351)     Initial Impression / Assessment and Plan / ED Course  I have reviewed the triage vital signs and the nursing notes.  Pertinent labs & imaging results that were available during my care of the patient were reviewed by me and considered in my medical decision making (see chart for details).     1:57 AM Patient with significant abdominal tenderness.  Will start with plain imaging, and if negative will proceed with CT imaging. 3:07 AM X-ray reviewed and is negative.  Patient still having pain, she is crying and writhing in the bed. We will order CT abdomen pelvis 4:58 AM Ct negative Will try PO challenge 6:51 AM Extensive workup has been negative.  Patient is taking p.o. fluids.  She is ambulatory.  She still reports diffuse abdominal pain. She now admits the pain is been ongoing for 3 weeks, typically worse when eating.  She has been not eating as much due to pain.  She is established with local gastroenterology, she had a EGD last year for an ulcer.  Denies any recent GI bleed symptoms  We will start her on Carafate, as well as Zofran. Advise close follow-up with gastroenterology Final Clinical Impressions(s) / ED Diagnoses   Final diagnoses:  Non-intractable cyclical vomiting with nausea  Generalized abdominal pain    ED Discharge Orders        Ordered    ondansetron (ZOFRAN ODT) 8 MG disintegrating tablet     11/20/17 0649    sucralfate (CARAFATE) 1 GM/10ML suspension  3 times daily with meals & bedtime     11/20/17 [id number]       Zadie RhineWickline, Lyndel Sarate,  MD 11/20/17 [phone]

## 2017-11-20 NOTE — ED Triage Notes (Signed)
Pt states that she has been having sharp abdominal pain when she eats for the last two weeks, tonight the pain became worse and did not go away. States last regular bowel movement was about when pain first started two weeks ago, but had diahhrea Sunday. Vomited this evening.

## 2017-11-20 NOTE — ED Notes (Signed)
Pt tolerating PO fluids, no n/v

## 2017-11-20 NOTE — Discharge Instructions (Signed)

## 2018-01-08 DIAGNOSIS — I10 Essential (primary) hypertension: Secondary | ICD-10-CM | POA: Diagnosis not present

## 2018-01-08 DIAGNOSIS — R112 Nausea with vomiting, unspecified: Secondary | ICD-10-CM | POA: Diagnosis not present

## 2018-01-08 DIAGNOSIS — K219 Gastro-esophageal reflux disease without esophagitis: Secondary | ICD-10-CM | POA: Diagnosis not present

## 2018-01-16 ENCOUNTER — Encounter: Payer: Self-pay | Admitting: Gastroenterology

## 2018-03-14 ENCOUNTER — Ambulatory Visit: Payer: BLUE CROSS/BLUE SHIELD | Admitting: Gastroenterology

## 2018-03-14 ENCOUNTER — Encounter: Payer: Self-pay | Admitting: Gastroenterology

## 2018-03-14 VITALS — BP 120/82 | HR 76 | Ht 62.0 in | Wt 128.1 lb

## 2018-03-14 DIAGNOSIS — K5909 Other constipation: Secondary | ICD-10-CM | POA: Diagnosis not present

## 2018-03-14 DIAGNOSIS — R1010 Upper abdominal pain, unspecified: Secondary | ICD-10-CM | POA: Diagnosis not present

## 2018-03-14 DIAGNOSIS — R1115 Cyclical vomiting syndrome unrelated to migraine: Secondary | ICD-10-CM

## 2018-03-14 DIAGNOSIS — G43A0 Cyclical vomiting, not intractable: Secondary | ICD-10-CM | POA: Diagnosis not present

## 2018-03-14 MED ORDER — LINACLOTIDE 145 MCG PO CAPS: 145.0000 ug | ORAL_CAPSULE | Freq: Every day | ORAL | 0 refills | Status: DC

## 2018-03-14 MED ORDER — LINACLOTIDE 72 MCG PO CAPS: 72.0000 ug | ORAL_CAPSULE | Freq: Every day | ORAL | 0 refills | Status: DC

## 2018-03-14 MED ORDER — ONDANSETRON HCL 4 MG PO TABS: 4.0000 mg | ORAL_TABLET | Freq: Three times a day (TID) | ORAL | 2 refills | Status: DC | PRN

## 2018-03-14 NOTE — Patient Instructions (Signed)
If you are age 41 or older, your body mass index should be between 23-30. Your Body mass index is 23.43 kg/m. If this is out of the aforementioned range listed, please consider follow up with your Primary Care Provider.  If you are age 41 or younger, your body mass index should be between 19-25. Your Body mass index is 23.43 kg/m. If this is out of the aformentioned range listed, please consider follow up with your Primary Care Provider.   You have been scheduled for an endoscopy. Please follow written instructions given to you at your visit today. If you use inhalers (even only as needed), please bring them with you on the day of your procedure. Your physician has requested that you go to www.startemmi.com and enter the access code given to you at your visit today. This web site gives a general overview about your procedure. However, you should still follow specific instructions given to you by our office regarding your preparation for the procedure.  We have sent the following medications to your pharmacy for you to pick up at your convenience: Zofran  We have given you samples of the following medication to take: Linzess 72 mcg  LOT [id number] EXP 12-2018 / Linzess 145 mcg LOT [id number] EXP 02-2019  It was a pleasure to meet you today!  Dr. Myrtie Neitheranis

## 2018-03-14 NOTE — Progress Notes (Signed)
[facility] Consult Note:  History: Shirley AlleyLinda A [name] 03/14/2018  Referring physician: Inc, [facility]  Reason for consult/chief complaint: Gastroesophageal Reflux (taking Omep 20 BID); Abdominal Pain (epigastric); and gastric ulcer (hx of bleeding per pt. Had egd 1 year ago at [facility])   Subjective  HPI:  This is a pleasant 42 year old woman accompanied by her husband, referred by primary care for episodic upper abdominal pain and vomiting.  She is previously been evaluated by the [facility], we do not have any of those records today.  Because of a family history of colon cancer in her father, she apparently underwent colonoscopy within the last couple of years.  She then recall having had an upper endoscopy with them as well perhaps a year ago and believes she may have had an ulcer. For at least the last year she has had episodic upper abdominal pain that is dull and sometimes radiates across the upper abdomen.  It might last days or a week, and there is often vomiting and nausea associated.  She does get relief from ondansetron if she has some of it and can take it early enough.  It stops the vomiting but does not seem to improve the pain. She has had 2 emergency department visits last November for pyelonephritis.  There was an ED visit in March of this year for an episode of this abdominal pain and vomiting, test results as noted below.  She is currently taking omeprazole 20 mg twice daily, and while this does control heartburn symptoms, does not keep these episodes from occurring.  She is a daily smoker, and uses "a couple of puffs" of marijuana nightly.  She says she must do this for appetite stimulation since otherwise she will not want to eat.  She denies aspirin or NSAID use.  ROS:  Review of Systems  Constitutional: Negative for appetite change and unexpected weight change.  HENT: Negative for mouth sores and voice change.   Eyes:  Negative for pain and redness.  Respiratory: Negative for cough and shortness of breath.   Cardiovascular: Negative for chest pain and palpitations.  Genitourinary: Negative for dysuria and hematuria.  Musculoskeletal: Negative for arthralgias and myalgias.  Skin: Negative for pallor and rash.  Neurological: Negative for weakness and headaches.  Hematological: Negative for adenopathy.     Past Medical History: Past Medical History:  Diagnosis Date  . Anemia   . Bacteremia   . Blood dyscrasia    has to apply a lot of pressure to stop bleeding  . GERD (gastroesophageal reflux disease)   . Heart murmur    with pregnancy  . History of hiatal hernia   . Hypertension    4-5 years ago, no meds  . Kidney infection    patient states I might have a kidney infection  . Migraine    migraines  . Pyelonephritis      Past Surgical History: Past Surgical History:  Procedure Laterality Date  . ABDOMINAL HERNIA REPAIR    . CESAREAN SECTION     x 4  . CESAREAN SECTION     x 4  . ENDOMETRIAL ABLATION  07/31/2016  . HYSTEROSCOPY  07/31/2016   Procedure: HYSTEROSCOPY WITH HYDROTHERMAL ABLATION;  Surgeon: Lancaster Bingharlie Pickens, MD;  Location: [facility];  Service: Gynecology;;     Family History: Family History  Problem Relation Age of Onset  . Hypertension Mother   . Colon cancer Father 1656  . Stroke Brother  Social History: Social History   Socioeconomic History  . Marital status: Married    Spouse name: Not on file  . Number of children: 4  . Years of education: Not on file  . Highest education level: Not on file  Occupational History  . Not on file  Social Needs  . Financial resource strain: Not on file  . Food insecurity:    Worry: Not on file    Inability: Not on file  . Transportation needs:    Medical: Not on file    Non-medical: Not on file  Tobacco Use  . Smoking status: Current Every Day Smoker    Packs/day: 0.50    Types: Cigarettes  . Smokeless tobacco:  Never Used  Substance and Sexual Activity  . Alcohol use: No    Comment: occasionally  . Drug use: Yes    Types: Marijuana  . Sexual activity: Yes    Birth control/protection: None  Lifestyle  . Physical activity:    Days per week: Not on file    Minutes per session: Not on file  . Stress: Not on file  Relationships  . Social connections:    Talks on phone: Not on file    Gets together: Not on file    Attends religious service: Not on file    Active member of club or organization: Not on file    Attends meetings of clubs or organizations: Not on file    Relationship status: Not on file  Other Topics Concern  . Not on file  Social History Narrative  . Not on file    Allergies: Allergies  Allergen Reactions  . Lisinopril Anaphylaxis    Feet, hands, and throat became swollen  . Aspirin Nausea Only    Makes stomach burn  . Ibuprofen Nausea Only    Makes stomach burn    Outpatient Meds: Current Outpatient Medications  Medication Sig Dispense Refill  . albuterol (PROVENTIL HFA;VENTOLIN HFA) 108 (90 Base) MCG/ACT inhaler Inhale 2 puffs into the lungs every 6 (six) hours as needed for wheezing or shortness of breath.    Marland Kitchen amLODipine (NORVASC) 2.5 MG tablet Take 2.5 mg by mouth daily.  1  . cloNIDine (CATAPRES) 0.2 MG tablet Take 0.2 mg by mouth 2 (two) times daily.     Marland Kitchen omeprazole (PRILOSEC) 20 MG capsule Take 20 mg by mouth 2 (two) times daily before a meal.    . ondansetron (ZOFRAN ODT) 8 MG disintegrating tablet 8mg  ODT q4 hours prn nausea 4 tablet 0  . polyethylene glycol (MIRALAX / GLYCOLAX) packet Take 17 g by mouth daily as needed.    . linaclotide (LINZESS) 145 MCG CAPS capsule Take 1 capsule (145 mcg total) by mouth daily before breakfast. 8 capsule 0  . linaclotide (LINZESS) 72 MCG capsule Take 1 capsule (72 mcg total) by mouth daily before breakfast. 8 capsule 0  . ondansetron (ZOFRAN) 4 MG tablet Take 1 tablet (4 mg total) by mouth every 8 (eight) hours as needed  for nausea or vomiting. 45 tablet 2   No current facility-administered medications for this visit.       ___________________________________________________________________ Objective   Exam:  BP 120/82   Pulse 76   Ht 5\' 2"  (1.575 m)   Wt 128 lb 2 oz (58.1 kg)   BMI 23.43 kg/m    General: this is a(n) thin woman, well-appearing, normal vocal quality  Eyes: sclera anicteric, no redness  ENT: oral mucosa moist without lesions, no cervical  or supraclavicular lymphadenopathy, good dentition  CV: RRR without murmur, S1/S2, no JVD, no peripheral edema  Resp: clear to auscultation bilaterally, normal RR and effort noted  GI: soft, upper midline tenderness, with active bowel sounds. No guarding or palpable organomegaly noted.  Skin; warm and dry, no rash or jaundice noted  Neuro: awake, alert and oriented x 3. Normal gross motor function and fluent speech  Labs:  CBC Latest Ref Rng & Units 11/19/2017 08/08/2017 08/07/2017  WBC 4.0 - 10.5 K/uL 8.2 5.3 6.7  Hemoglobin 12.0 - 15.0 g/dL 13.2 10.1(L) 10.0(L)  Hematocrit 36.0 - 46.0 % 36.4 29.3(L) 28.9(L)  Platelets 150 - 400 K/uL 376 547(H) 452(H)   CMP Latest Ref Rng & Units 11/19/2017 08/08/2017 08/07/2017  Glucose 65 - 99 mg/dL 94 440(N) 97  BUN 6 - 20 mg/dL 8 <0(U) <7(O)  Creatinine 0.44 - 1.00 mg/dL 5.36 6.44 0.34  Sodium 135 - 145 mmol/L 138 139 138  Potassium 3.5 - 5.1 mmol/L 3.5 3.0(L) 2.8(L)  Chloride 101 - 111 mmol/L 107 109 110  CO2 22 - 32 mmol/L 21(L) 20(L) 19(L)  Calcium 8.9 - 10.3 mg/dL 9.3 7.4(Q) 5.9(D)  Total Protein 6.5 - 8.1 g/dL 7.1 - -  Total Bilirubin 0.3 - 1.2 mg/dL 0.5 - -  Alkaline Phos 38 - 126 U/L 41 - -  AST 15 - 41 U/L 19 - -  ALT 14 - 54 U/L 14 - -     Radiologic Studies:  CTAP 11/20/17  Nothing acute  [place] 07/2017:  GB sludge, "Mild" GB wall thickening, CBD 4mm  Assessment: Encounter Diagnoses  Name Primary?  . Non-intractable cyclical vomiting with nausea Yes  . Upper abdominal pain    . Chronic constipation     Multiple GI symptoms, most bothersome seems to be the episodic upper abdominal pain with nausea and vomiting.  It is somewhat difficult to characterize, does not seem typical for biliary colic were it may last days or a week at a time.  It also seems less likely to be gastritis or ulcer since it is episodic.  We will try to get previous endoscopic reports for more information since she is Artie been evaluated for this problem by different GI practice. MiraLAX has been somewhat helpful in the past, but she reports that it would eventually seem to lose its effect.  Plan:  Upper endoscopy  The benefits and risks of the planned procedure were described in detail with the patient or (when appropriate) their health care proxy.  Risks were outlined as including, but not limited to, bleeding, infection, perforation, adverse medication reaction leading to cardiac or pulmonary decompensation, or pancreatitis (if ERCP).  The limitation of incomplete mucosal visualization was also discussed.  No guarantees or warranties were given.  Samples of Linzess at 72 and 145 mcg doses were given.  Ondansetron prescribed to be taken as needed during these episodes.  Thank you for the courtesy of this consult.  Please call me with any questions or concerns.  Charlie Pitter III  CC: [facility], [facility]

## 2018-03-23 ENCOUNTER — Encounter (HOSPITAL_COMMUNITY): Payer: Self-pay | Admitting: Emergency Medicine

## 2018-03-23 ENCOUNTER — Emergency Department (HOSPITAL_COMMUNITY)
Admission: EM | Admit: 2018-03-23 | Discharge: 2018-03-23 | Disposition: A | Discharge status: Home / Self Care | Payer: BLUE CROSS/BLUE SHIELD | Attending: Emergency Medicine | Admitting: Emergency Medicine

## 2018-03-23 DIAGNOSIS — F17210 Nicotine dependence, cigarettes, uncomplicated: Secondary | ICD-10-CM | POA: Insufficient documentation

## 2018-03-23 DIAGNOSIS — Z3202 Encounter for pregnancy test, result negative: Secondary | ICD-10-CM | POA: Diagnosis not present

## 2018-03-23 DIAGNOSIS — N12 Tubulo-interstitial nephritis, not specified as acute or chronic: Secondary | ICD-10-CM | POA: Insufficient documentation

## 2018-03-23 DIAGNOSIS — M549 Dorsalgia, unspecified: Secondary | ICD-10-CM | POA: Diagnosis not present

## 2018-03-23 DIAGNOSIS — R509 Fever, unspecified: Secondary | ICD-10-CM | POA: Diagnosis not present

## 2018-03-23 DIAGNOSIS — I10 Essential (primary) hypertension: Secondary | ICD-10-CM | POA: Insufficient documentation

## 2018-03-23 DIAGNOSIS — R109 Unspecified abdominal pain: Secondary | ICD-10-CM

## 2018-03-23 DIAGNOSIS — Z79899 Other long term (current) drug therapy: Secondary | ICD-10-CM | POA: Insufficient documentation

## 2018-03-23 DIAGNOSIS — R1012 Left upper quadrant pain: Secondary | ICD-10-CM | POA: Diagnosis not present

## 2018-03-23 LAB — URINALYSIS, ROUTINE W REFLEX MICROSCOPIC
Bacteria, UA: NONE SEEN
Bilirubin Urine: NEGATIVE
Glucose, UA: NEGATIVE mg/dL
Ketones, ur: NEGATIVE mg/dL
Nitrite: NEGATIVE
Protein, ur: NEGATIVE mg/dL
Specific Gravity, Urine: 1.012 (ref 1.005–1.030)
pH: 7 (ref 5.0–8.0)

## 2018-03-23 LAB — PREGNANCY, URINE: Preg Test, Ur: NEGATIVE

## 2018-03-23 MED ORDER — CEPHALEXIN 500 MG PO CAPS: 500.0000 mg | ORAL_CAPSULE | Freq: Four times a day (QID) | ORAL | 0 refills | Status: DC

## 2018-03-23 MED ORDER — ACETAMINOPHEN 500 MG PO TABS
1000.0000 mg | ORAL_TABLET | Freq: Once | ORAL | Status: AC
  Administered 2018-03-23: 1000 mg via ORAL
  Filled 2018-03-23: qty 2

## 2018-03-23 MED ORDER — LIDOCAINE HCL 1 % IJ SOLN
INTRAMUSCULAR | Status: AC
  Administered 2018-03-23: 2.1 mL
  Filled 2018-03-23: qty 20

## 2018-03-23 MED ORDER — CEFTRIAXONE SODIUM 1 G IJ SOLR
1.0000 g | Freq: Once | INTRAMUSCULAR | Status: AC
  Administered 2018-03-23: 1 g via INTRAMUSCULAR
  Filled 2018-03-23: qty 10

## 2018-03-23 NOTE — Discharge Instructions (Signed)
It was our pleasure to provide your ER care today - we hope that you feel better. ° °Rest. Drink plenty of fluids. ° °Take antibiotic as prescribed. ° °Take acetaminophen as need for pain.  ° °Follow up with primary care doctor in the next couple days for recheck if symptoms fail to improve/resolve. ° °Return to ER if worse, new or severe pain, persistent vomiting, other concern.  °

## 2018-03-23 NOTE — ED Provider Notes (Signed)
[facility]   CSN: [id number] Arrival date & time: 03/23/18  0825     History   Chief Complaint Chief Complaint  Patient presents with  . Flank Pain    HPI Shirley [name] is a 42 y.o. female.  Patient c/o left flank pain in the past day, pain constant, dull, moderate-severe, non radiating, worse w palpation flank posteriorly. No vomiting. Having normal bms. +urinary frequency/urgency. Hx uti/pyelo. Denies recent abx use. +fevers. No vaginal discharge or bleeding. Hx endometrial ablation.   The history is provided by the patient.  Flank Pain  Pertinent negatives include no chest pain, no headaches and no shortness of breath.    Past Medical History:  Diagnosis Date  . Anemia   . Bacteremia   . Blood dyscrasia    has to apply a lot of pressure to stop bleeding  . GERD (gastroesophageal reflux disease)   . Heart murmur    with pregnancy  . History of hiatal hernia   . Hypertension    4-5 years ago, no meds  . Kidney infection    patient states I might have a kidney infection  . Migraine    migraines  . Pyelonephritis     Patient Active Problem List   Diagnosis Date Noted  . Pyelonephritis 08/03/2017  . Bacteremia 08/03/2017  . Hypokalemia 08/03/2017  . Tobacco use 08/03/2017  . Acute postoperative pain 07/31/2016  . Menometrorrhagia 04/09/2016  . Anemia 04/09/2016  . Abnormal uterine bleeding (AUB) 04/09/2016  . Essential hypertension 11/17/2009    Past Surgical History:  Procedure Laterality Date  . ABDOMINAL HERNIA REPAIR    . CESAREAN SECTION     x 4  . CESAREAN SECTION     x 4  . ENDOMETRIAL ABLATION  07/31/2016  . HYSTEROSCOPY  07/31/2016   Procedure: HYSTEROSCOPY WITH HYDROTHERMAL ABLATION;  Surgeon: Largo Bing, MD;  Location: [facility];  Service: Gynecology;;     OB History    Gravida  5   Para  4   Term  3   Preterm  1   AB  1   Living  4     SAB      TAB      Ectopic      Multiple      Live Births           Obstetric Comments  c--section x 4. 36wk c-section x 1         Home Medications    Prior to Admission medications   Medication Sig Start Date End Date Taking? Authorizing Provider  albuterol (PROVENTIL HFA;VENTOLIN HFA) 108 (90 Base) MCG/ACT inhaler Inhale 2 puffs into the lungs every 6 (six) hours as needed for wheezing or shortness of breath.   Yes [provider]  amLODipine (NORVASC) 2.5 MG tablet Take 2.5 mg by mouth daily. 01/08/18  Yes [provider]  linaclotide Karlene Einstein) 72 MCG capsule Take 1 capsule (72 mcg total) by mouth daily before breakfast. 03/14/18  Yes Danis, Andreas Blower, MD  Multiple Vitamins-Minerals (WOMENS DAILY FORMULA PO) Take 1 tablet by mouth daily.   Yes [provider]  omeprazole (PRILOSEC) 20 MG capsule Take 20 mg by mouth 2 (two) times daily before a meal.   Yes [provider]  ondansetron (ZOFRAN ODT) 8 MG disintegrating tablet 8mg  ODT q4 hours prn nausea 11/20/17  Yes Zadie Rhine, MD  polyethylene glycol (MIRALAX / GLYCOLAX) packet Take 17 g by  mouth daily as needed.   Yes [provider]  linaclotide Karlene Einstein(LINZESS) 145 MCG CAPS capsule Take 1 capsule (145 mcg total) by mouth daily before breakfast. Patient not taking: Reported on 03/23/2018 03/14/18   Sherrilyn Ristanis, Henry L III, MD    Family History Family History  Problem Relation Age of Onset  . Hypertension Mother   . Colon cancer Father 3456  . Stroke Brother     Social History Social History   Tobacco Use  . Smoking status: Current Every Day Smoker    Packs/day: 0.50    Types: Cigarettes  . Smokeless tobacco: Never Used  Substance Use Topics  . Alcohol use: No    Comment: occasionally  . Drug use: Yes    Types: Marijuana     Allergies   Lisinopril; Aspirin; and Ibuprofen   Review of Systems Review of Systems  Constitutional: Positive for fever.  HENT: Negative for sore throat.   Eyes: Negative for  redness.  Respiratory: Negative for cough and shortness of breath.   Cardiovascular: Negative for chest pain.  Gastrointestinal: Negative for diarrhea and vomiting.  Genitourinary: Positive for flank pain, frequency and urgency.  Musculoskeletal: Positive for back pain.  Skin: Negative for rash.  Neurological: Negative for headaches.  Hematological: Does not bruise/bleed easily.  Psychiatric/Behavioral: Negative for confusion.     Physical Exam Updated Vital Signs BP (!) 164/99 (BP Location: Left Arm)   Pulse (!) 108   Temp (!) 102.5 F (39.2 C) (Oral)   Resp 18   Ht 1.575 m (5\' 2" )   Wt 58.1 kg (128 lb)   SpO2 100%   BMI 23.41 kg/m   Physical Exam  Constitutional: She appears well-developed and well-nourished.  HENT:  Mouth/Throat: Oropharynx is clear and moist.  Eyes: Conjunctivae are normal. No scleral icterus.  Neck: Neck supple. No tracheal deviation present.  Cardiovascular: Regular rhythm, normal heart sounds and intact distal pulses.  No murmur heard. Pulmonary/Chest: Effort normal and breath sounds normal. No respiratory distress.  Abdominal: Soft. Normal appearance and bowel sounds are normal. She exhibits no distension and no mass. There is tenderness. There is no guarding.  Left upper abd tenderness.   Genitourinary:  Genitourinary Comments: Left cva tenderness.   Musculoskeletal: She exhibits no edema.  Neurological: She is alert.  Skin: Skin is warm and dry. No rash noted.  Psychiatric: She has a normal mood and affect.  Nursing note and vitals reviewed.    ED Treatments / Results  Labs (all labs ordered are listed, but only abnormal results are displayed) Results for orders placed or performed during the hospital encounter of 03/23/18  Urinalysis, Routine w reflex microscopic- may I&O cath if menses  Result Value Ref Range   Color, Urine YELLOW YELLOW   APPearance CLEAR CLEAR   Specific Gravity, Urine 1.012 1.005 - 1.030   pH 7.0 5.0 - 8.0    Glucose, UA NEGATIVE NEGATIVE mg/dL   Hgb urine dipstick MODERATE (A) NEGATIVE   Bilirubin Urine NEGATIVE NEGATIVE   Ketones, ur NEGATIVE NEGATIVE mg/dL   Protein, ur NEGATIVE NEGATIVE mg/dL   Nitrite NEGATIVE NEGATIVE   Leukocytes, UA SMALL (A) NEGATIVE   RBC / HPF 11-20 0 - 5 RBC/hpf   WBC, UA 21-50 0 - 5 WBC/hpf   Bacteria, UA NONE SEEN NONE SEEN   Squamous Epithelial / LPF 0-5 0 - 5   Mucus PRESENT   Pregnancy, urine  Result Value Ref Range   Preg Test, Ur NEGATIVE NEGATIVE  EKG None  Radiology No results found.  Procedures Procedures (including critical care time)  Medications Ordered in ED Medications  acetaminophen (TYLENOL) tablet 1,000 mg (has no administration in time range)  cefTRIAXone (ROCEPHIN) injection 1 g (has no administration in time range)     Initial Impression / Assessment and Plan / ED Course  I have reviewed the triage vital signs and the nursing notes.  Pertinent labs & imaging results that were available during my care of the patient were reviewed by me and considered in my medical decision making (see chart for details).  Labs sent.  Reviewed nursing notes and prior charts for additional history.   UA and exam c/w left pyelonephritis.   Confirmed no abx allergies. Rocephin im. Acetaminophen po. Po fluids.   Tolerating po. No vomiting.   Pt appears stable for d/c.     Final Clinical Impressions(s) / ED Diagnoses   Final diagnoses:  None    ED Discharge Orders    None       Cathren Laine, MD 03/23/18 1323

## 2018-03-23 NOTE — ED Triage Notes (Signed)
Patient here from home with complaints of left flank pain that started at 3am this morning. Denies nausea, vomiting. Reports urinary frequency.

## 2018-03-25 LAB — URINE CULTURE: Culture: 80000 — AB

## 2018-03-26 ENCOUNTER — Telehealth: Payer: Self-pay | Admitting: *Deleted

## 2018-03-26 NOTE — Telephone Encounter (Signed)
Post ED Visit - Positive Culture Follow-up  Culture report reviewed by antimicrobial stewardship pharmacist:  []  Enzo BiNathan Batchelder, Pharm.D. []  Celedonio MiyamotoJeremy Frens, Pharm.D., BCPS AQ-ID []  Garvin FilaMike Maccia, Pharm.D., BCPS []  Georgina PillionElizabeth Martin, Pharm.D., BCPS []  LakewoodMinh Pham, [address] BoulevardPharm.D., BCPS, AAHIVP []  Estella HuskMichelle Turner, Pharm.D., BCPS, AAHIVP [x]  Lysle Pearlachel Rumbarger, PharmD, BCPS []  Phillips Climeshuy Dang, PharmD, BCPS []  Agapito GamesAlison Masters, PharmD, BCPS []  Verlan FriendsErin Deja, PharmD  Positive urine culture Treated with Cephalexin, organism sensitive to the same and no further patient follow-up is required at this time.  Virl AxeRobertson, Naavya Postma [facility] 03/26/2018, 10:36 AM

## 2018-03-31 ENCOUNTER — Encounter: Payer: Self-pay | Admitting: Gastroenterology

## 2018-04-11 ENCOUNTER — Ambulatory Visit (AMBULATORY_SURGERY_CENTER): Payer: BLUE CROSS/BLUE SHIELD | Admitting: Gastroenterology

## 2018-04-11 ENCOUNTER — Encounter: Payer: Self-pay | Admitting: Gastroenterology

## 2018-04-11 VITALS — BP 124/84 | HR 66 | Temp 97.5°F | Resp 12 | Ht 62.0 in | Wt 128.0 lb

## 2018-04-11 DIAGNOSIS — G43A0 Cyclical vomiting, not intractable: Secondary | ICD-10-CM | POA: Diagnosis not present

## 2018-04-11 DIAGNOSIS — K2990 Gastroduodenitis, unspecified, without bleeding: Secondary | ICD-10-CM

## 2018-04-11 DIAGNOSIS — K259 Gastric ulcer, unspecified as acute or chronic, without hemorrhage or perforation: Secondary | ICD-10-CM | POA: Diagnosis not present

## 2018-04-11 DIAGNOSIS — R112 Nausea with vomiting, unspecified: Secondary | ICD-10-CM | POA: Diagnosis not present

## 2018-04-11 DIAGNOSIS — R1115 Cyclical vomiting syndrome unrelated to migraine: Secondary | ICD-10-CM

## 2018-04-11 DIAGNOSIS — K2970 Gastritis, unspecified, without bleeding: Secondary | ICD-10-CM

## 2018-04-11 DIAGNOSIS — R1010 Upper abdominal pain, unspecified: Secondary | ICD-10-CM | POA: Diagnosis not present

## 2018-04-11 DIAGNOSIS — R1011 Right upper quadrant pain: Secondary | ICD-10-CM

## 2018-04-11 DIAGNOSIS — K3189 Other diseases of stomach and duodenum: Secondary | ICD-10-CM | POA: Diagnosis not present

## 2018-04-11 MED ORDER — SODIUM CHLORIDE 0.9 % IV SOLN: 500.0000 mL | Freq: Once | INTRAVENOUS | Status: DC

## 2018-04-11 NOTE — Progress Notes (Signed)
Pt. Reports no change in her medical or surgical history since her pre-visit 03/14/2018.

## 2018-04-11 NOTE — Patient Instructions (Signed)
DISCONTINUE USE OF ANY PRODUCT CONTAINING NICOTINE   YOU HAD AN ENDOSCOPIC PROCEDURE TODAY AT THE [facility]:   Refer to the procedure report that was given to you for any specific questions about what was found during the examination.  If the procedure report does not answer your questions, please call your gastroenterologist to clarify.  If you requested that your care partner not be given the details of your procedure findings, then the procedure report has been included in a sealed envelope for you to review at your convenience later.  YOU SHOULD EXPECT: Some feelings of bloating in the abdomen. Passage of more gas than usual.  Walking can help get rid of the air that was put into your GI tract during the procedure and reduce the bloating. If you had a lower endoscopy (such as a colonoscopy or flexible sigmoidoscopy) you may notice spotting of blood in your stool or on the toilet paper. If you underwent a bowel prep for your procedure, you may not have a normal bowel movement for a few days.  Please Note:  You might notice some irritation and congestion in your nose or some drainage.  This is from the oxygen used during your procedure.  There is no need for concern and it should clear up in a day or so.  SYMPTOMS TO REPORT IMMEDIATELY:    Following upper endoscopy (EGD)  Vomiting of blood or coffee ground material  New chest pain or pain under the shoulder blades  Painful or persistently difficult swallowing  New shortness of breath  Fever of 100F or higher  Black, tarry-looking stools  For urgent or emergent issues, a gastroenterologist can be reached at any hour by calling [phone].   DIET:  We do recommend a small meal at first, but then you may proceed to your regular diet.  Drink plenty of fluids but you should avoid alcoholic beverages for 24 hours.  ACTIVITY:  You should plan to take it easy for the rest of today and you should NOT DRIVE or use heavy  machinery until tomorrow (because of the sedation medicines used during the test).    FOLLOW UP: Our staff will call the number listed on your records the next business day following your procedure to check on you and address any questions or concerns that you may have regarding the information given to you following your procedure. If we do not reach you, we will leave a message.  However, if you are feeling well and you are not experiencing any problems, there is no need to return our call.  We will assume that you have returned to your regular daily activities without incident.  If any biopsies were taken you will be contacted by phone or by letter within the next 1-3 weeks.  Please call us at [phone] if you have not heard about the biopsies in 3 weeks.    SIGNATURES/CONFIDENTIALITY: You and/or your care partner have signed paperwork which will be entered into your electronic medical record.  These signatures attest to the fact that that the information above on your After Visit Summary has been reviewed and is understood.  Full responsibility of the confidentiality of this discharge information lies with you and/or your care-partner.

## 2018-04-11 NOTE — Op Note (Signed)
[facility] Patient Name: Shirley [name] Procedure Date: 04/11/2018 9:53 AM MRN: [id number] Endoscopist: Sherilyn Cooter L. Myrtie Neither , MD Age: 42 Referring MD:  Date of Birth: 10/03/1975 Gender: Female Account #: [id number] Procedure:                Upper GI endoscopy Indications:              Upper abdominal pain,Episodic Nausea with vomiting Medicines:                Monitored Anesthesia Care Procedure:                Pre-Anesthesia Assessment:                           - Prior to the procedure, a History and Physical                            was performed, and patient medications and                            allergies were reviewed. The patient's tolerance of                            previous anesthesia was also reviewed. The risks                            and benefits of the procedure and the sedation                            options and risks were discussed with the patient.                            All questions were answered, and informed consent                            was obtained. Prior Anticoagulants: The patient has                            taken no previous anticoagulant or antiplatelet                            agents. ASA Grade Assessment: II - A patient with                            mild systemic disease. After reviewing the risks                            and benefits, the patient was deemed in                            satisfactory condition to undergo the procedure.                           After obtaining informed consent, the endoscope was  passed under direct vision. Throughout the                            procedure, the patient's blood pressure, pulse, and                            oxygen saturations were monitored continuously. The                            Endoscope was introduced through the mouth, and                            advanced to the second part of duodenum. The upper                            GI  endoscopy was accomplished without difficulty.                            The patient tolerated the procedure well. Scope In: Scope Out: Findings:                 The larynx was normal.                           The esophagus was normal.                           Localized erythematous mucosa with a few small                            erosions and without bleeding was found in the                            prepyloric region and pylorus of the stomach.                            Several biopsies were obtained with cold forceps                            for histology in the gastric body and in the                            gastric antrum.                           The cardia and gastric fundus were normal on                            retroflexion.                           Diffuse congested mucosa without active bleeding                            and with no stigmata of bleeding was found in the  duodenal bulb (mucosa normal inremainder of                            duodenum). Several biopsies were obtained with cold                            forceps for histology in the duodenal bulb and in                            the second portion of the duodenum.                           The exam of the duodenum was otherwise normal. Complications:            No immediate complications. Estimated Blood Loss:     Estimated blood loss was minimal. Impression:               - Normal larynx.                           - Normal esophagus.                           - Erythematous mucosa in the prepyloric region of                            the stomach.                           - Congested duodenal mucosa.                           - Several biopsies were obtained in the gastric                            body and in the gastric antrum.                           - Several biopsies were obtained in the duodenal                            bulb and in the second portion of  the duodenum. Recommendation:           - Patient has a contact number available for                            emergencies. The signs and symptoms of potential                            delayed complications were discussed with the                            patient. Return to normal activities tomorrow.                            Written discharge instructions were provided to  the                            patient.                           - Resume previous diet.                           - Continue present medications.                           - Discontinue the use of any products containing                            nicotine.                           - Await pathology results.                           - Return to my office at appointment to be                            scheduled after results obtained. Mahir Prabhakar L. Myrtie Neither, MD 04/11/2018 10:14:05 AM This report has been signed electronically.

## 2018-04-11 NOTE — Progress Notes (Signed)
Called to room to assist during endoscopic procedure.  Patient ID and intended procedure confirmed with present staff. Received instructions for my participation in the procedure from the performing physician.  

## 2018-04-11 NOTE — Progress Notes (Signed)
Report given to PACU, vss 

## 2018-04-14 ENCOUNTER — Telehealth: Payer: Self-pay | Admitting: *Deleted

## 2018-04-14 NOTE — Telephone Encounter (Signed)
  Follow up Call-  Call back number 04/11/2018  Post procedure Call Back phone  # [phone]  Permission to leave phone message Yes  Some recent data might be hidden     Patient questions:  Do you have a fever, pain , or abdominal swelling? No. Pain Score  0 *  Have you tolerated food without any problems? Yes.    Have you been able to return to your normal activities? Yes.    Do you have any questions about your discharge instructions: Diet   No. Medications  No. Follow up visit  No.  Do you have questions or concerns about your Care? No.  Actions: * If pain score is 4 or above: No action needed, pain <4.

## 2018-04-23 ENCOUNTER — Other Ambulatory Visit: Payer: Self-pay

## 2018-04-23 DIAGNOSIS — K529 Noninfective gastroenteritis and colitis, unspecified: Secondary | ICD-10-CM

## 2018-04-24 ENCOUNTER — Other Ambulatory Visit (INDEPENDENT_AMBULATORY_CARE_PROVIDER_SITE_OTHER): Payer: BLUE CROSS/BLUE SHIELD

## 2018-04-24 DIAGNOSIS — K529 Noninfective gastroenteritis and colitis, unspecified: Secondary | ICD-10-CM | POA: Diagnosis not present

## 2018-04-24 LAB — IGA: IgA: 199 mg/dL (ref 68–378)

## 2018-04-25 LAB — TISSUE TRANSGLUTAMINASE, IGA: (tTG) Ab, IgA: 1 U/mL

## 2018-04-30 ENCOUNTER — Telehealth: Payer: Self-pay | Admitting: Gastroenterology

## 2018-04-30 ENCOUNTER — Ambulatory Visit: Payer: BLUE CROSS/BLUE SHIELD | Admitting: Gastroenterology

## 2018-05-05 ENCOUNTER — Emergency Department (HOSPITAL_COMMUNITY)
Admission: EM | Admit: 2018-05-05 | Discharge: 2018-05-05 | Disposition: A | Discharge status: Home / Self Care | Payer: BLUE CROSS/BLUE SHIELD | Attending: Emergency Medicine | Admitting: Emergency Medicine

## 2018-05-05 ENCOUNTER — Emergency Department (HOSPITAL_COMMUNITY): Payer: BLUE CROSS/BLUE SHIELD

## 2018-05-05 DIAGNOSIS — Z79899 Other long term (current) drug therapy: Secondary | ICD-10-CM | POA: Diagnosis not present

## 2018-05-05 DIAGNOSIS — N839 Noninflammatory disorder of ovary, fallopian tube and broad ligament, unspecified: Secondary | ICD-10-CM | POA: Diagnosis not present

## 2018-05-05 DIAGNOSIS — A599 Trichomoniasis, unspecified: Secondary | ICD-10-CM | POA: Diagnosis not present

## 2018-05-05 DIAGNOSIS — R1031 Right lower quadrant pain: Secondary | ICD-10-CM

## 2018-05-05 DIAGNOSIS — F17210 Nicotine dependence, cigarettes, uncomplicated: Secondary | ICD-10-CM | POA: Diagnosis not present

## 2018-05-05 DIAGNOSIS — N739 Female pelvic inflammatory disease, unspecified: Secondary | ICD-10-CM | POA: Diagnosis not present

## 2018-05-05 DIAGNOSIS — I10 Essential (primary) hypertension: Secondary | ICD-10-CM | POA: Insufficient documentation

## 2018-05-05 DIAGNOSIS — N730 Acute parametritis and pelvic cellulitis: Secondary | ICD-10-CM | POA: Diagnosis not present

## 2018-05-05 DIAGNOSIS — N1330 Unspecified hydronephrosis: Secondary | ICD-10-CM | POA: Diagnosis not present

## 2018-05-05 LAB — URINALYSIS, ROUTINE W REFLEX MICROSCOPIC
Bilirubin Urine: NEGATIVE
Glucose, UA: NEGATIVE mg/dL
Ketones, ur: 5 mg/dL — AB
Leukocytes, UA: NEGATIVE
Nitrite: NEGATIVE
Protein, ur: NEGATIVE mg/dL
Specific Gravity, Urine: 1.009 (ref 1.005–1.030)
pH: 7 (ref 5.0–8.0)

## 2018-05-05 LAB — COMPREHENSIVE METABOLIC PANEL
ALT: 14 U/L (ref 0–44)
AST: 20 U/L (ref 15–41)
Albumin: 4.3 g/dL (ref 3.5–5.0)
Alkaline Phosphatase: 45 U/L (ref 38–126)
Anion gap: 9 (ref 5–15)
BUN: 7 mg/dL (ref 6–20)
CO2: 23 mmol/L (ref 22–32)
Calcium: 9.7 mg/dL (ref 8.9–10.3)
Chloride: 109 mmol/L (ref 98–111)
Creatinine, Ser: 0.6 mg/dL (ref 0.44–1.00)
GFR calc Af Amer: >60 mL/min (ref >60)
GFR calc non Af Amer: >60 mL/min (ref >60)
Glucose, Bld: 96 mg/dL (ref 70–99)
Potassium: 3.4 mmol/L — ABNORMAL LOW (ref 3.5–5.1)
Sodium: 141 mmol/L (ref 135–145)
Total Bilirubin: 0.8 mg/dL (ref 0.3–1.2)
Total Protein: 7.3 g/dL (ref 6.5–8.1)

## 2018-05-05 LAB — CBC WITH DIFFERENTIAL/PLATELET
Abs Immature Granulocytes: 0 10*3/uL (ref 0.0–0.1)
Basophils Absolute: 0 10*3/uL (ref 0.0–0.1)
Basophils Relative: 0 %
Eosinophils Absolute: 0.1 10*3/uL (ref 0.0–0.7)
Eosinophils Relative: 1 %
HCT: 34.6 % — ABNORMAL LOW (ref 36.0–46.0)
Hemoglobin: 11.4 g/dL — ABNORMAL LOW (ref 12.0–15.0)
Immature Granulocytes: 0 %
Lymphocytes Relative: 22 %
Lymphs Abs: 1.1 10*3/uL (ref 0.7–4.0)
MCH: 31.8 pg (ref 26.0–34.0)
MCHC: 32.9 g/dL (ref 30.0–36.0)
MCV: 96.4 fL (ref 78.0–100.0)
Monocytes Absolute: 0.3 10*3/uL (ref 0.1–1.0)
Monocytes Relative: 5 %
Neutro Abs: 3.8 10*3/uL (ref 1.7–7.7)
Neutrophils Relative %: 72 %
Platelets: 297 10*3/uL (ref 150–400)
RBC: 3.59 MIL/uL — ABNORMAL LOW (ref 3.87–5.11)
RDW: 14 % (ref 11.5–15.5)
WBC: 5.3 10*3/uL (ref 4.0–10.5)

## 2018-05-05 LAB — WET PREP, GENITAL
Sperm: NONE SEEN
Yeast Wet Prep HPF POC: NONE SEEN

## 2018-05-05 LAB — POC URINE PREG, ED: Preg Test, Ur: NEGATIVE

## 2018-05-05 LAB — LIPASE, BLOOD: Lipase: 27 U/L (ref 11–51)

## 2018-05-05 IMAGING — CT CT RENAL STONE PROTOCOL
2 of 4 series · 15 of 46 positions shown, 17 images · non-contrast
Comparison: Pelvis ultrasound earlier today. Lung bases remain
normal. No pericardial or pleural effusion.

CLINICAL DATA: 42-year-old female with right flank pain, right
lower quadrant pain which began suddenly 2 days ago.

EXAM:
CT ABDOMEN AND PELVIS WITHOUT CONTRAST
TECHNIQUE: Multidetector CT imaging of the abdomen and pelvis was performed
following the standard protocol without IV contrast.

[Series 3: ap without · axial · non-contrast · 0.55mm/px · z∈[+754,+1144]mm · 12 of 88 slices shown, 14 images]
[im 5/88  soft-tissue]
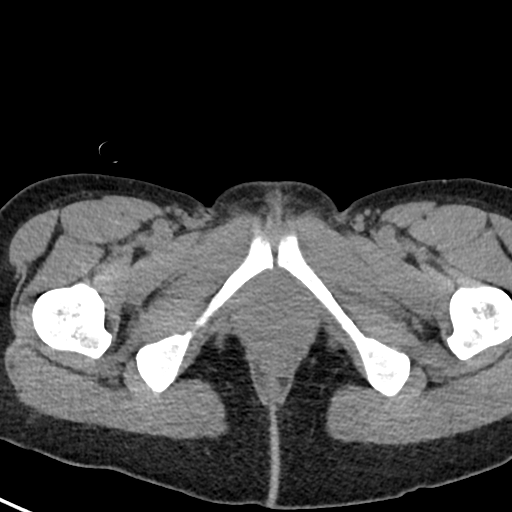
[im 5/88  bone]
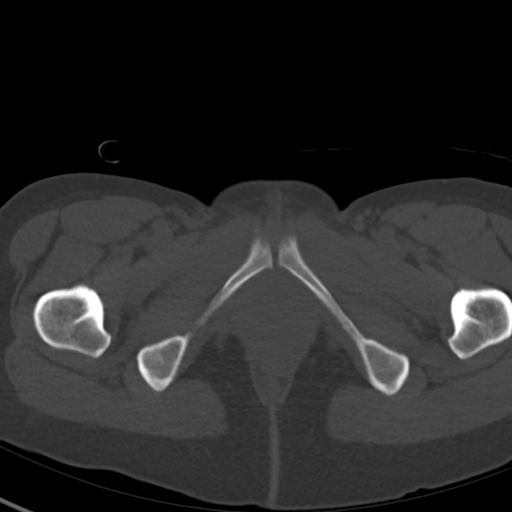
[im 15/88  soft-tissue]
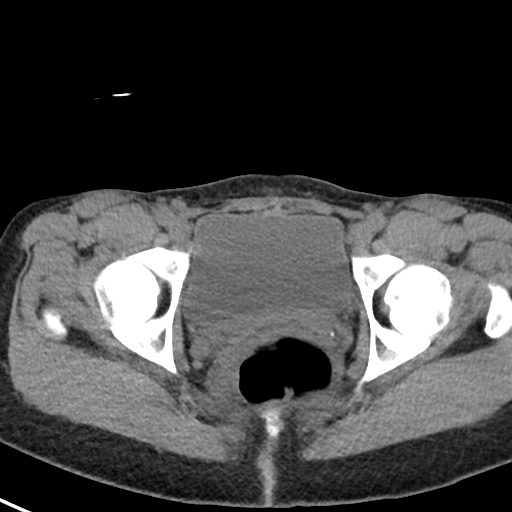
[im 20/88  soft-tissue]
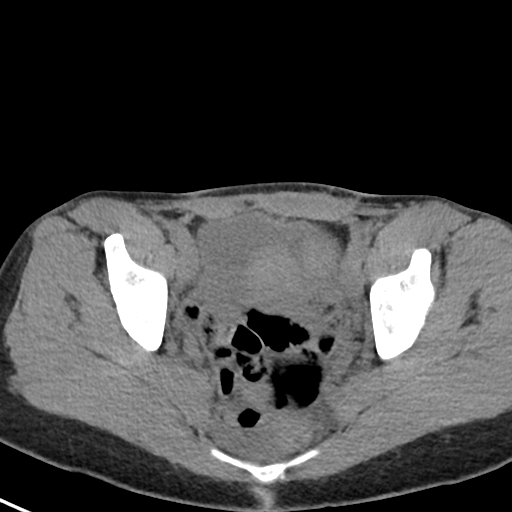
[im 25/88  soft-tissue]
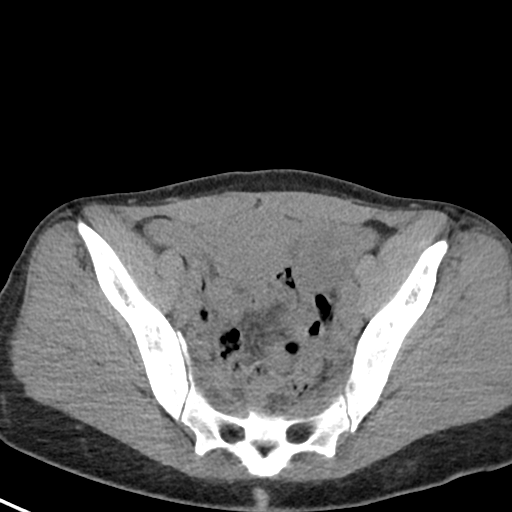
[im 34/88  soft-tissue]
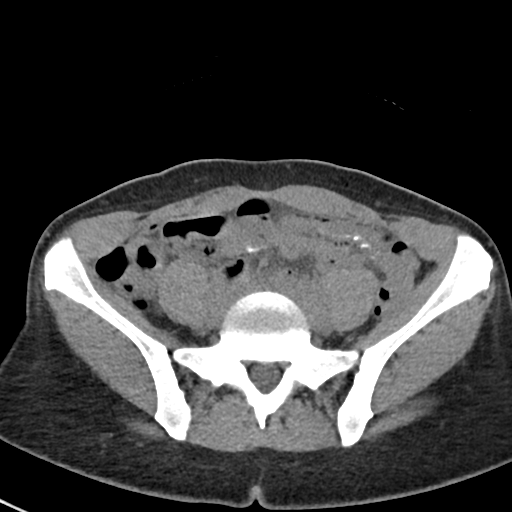
[im 39/88  soft-tissue]
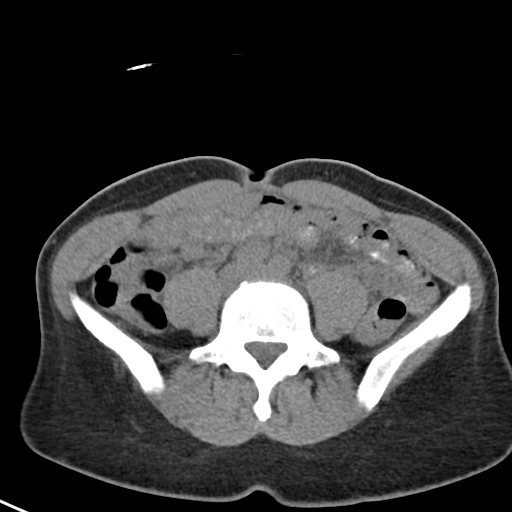
[im 49/88  soft-tissue]
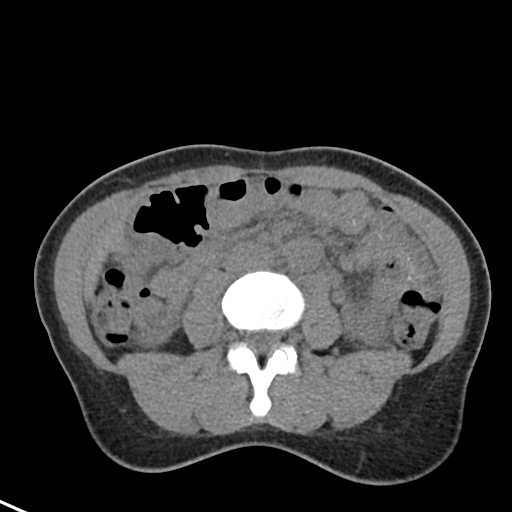
[im 54/88  soft-tissue]
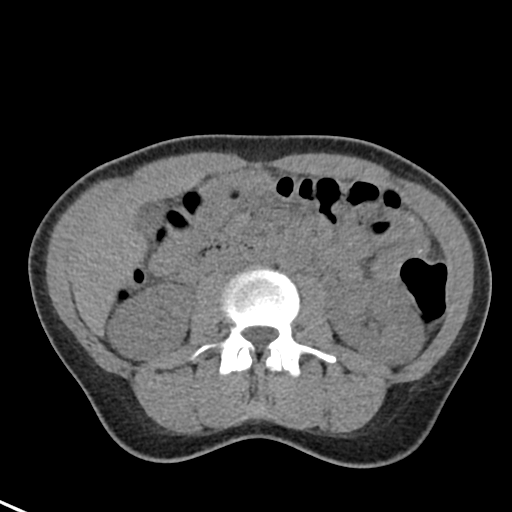
[im 63/88  soft-tissue]
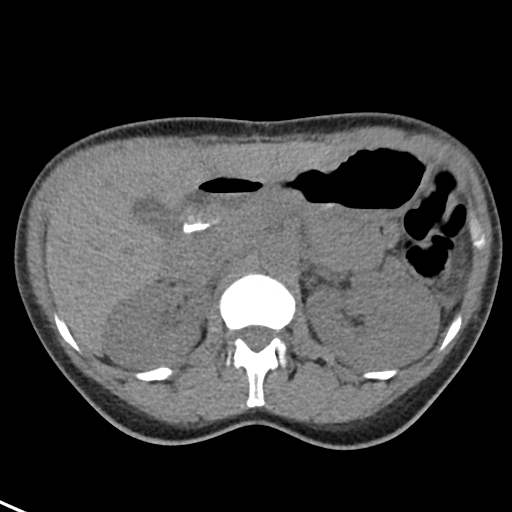
[im 63/88  bone]
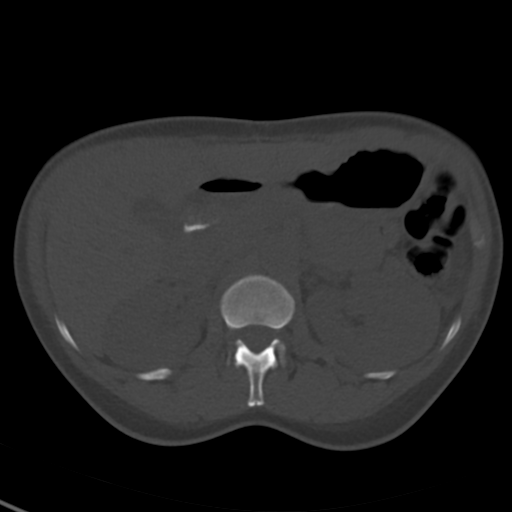
[im 68/88  soft-tissue]
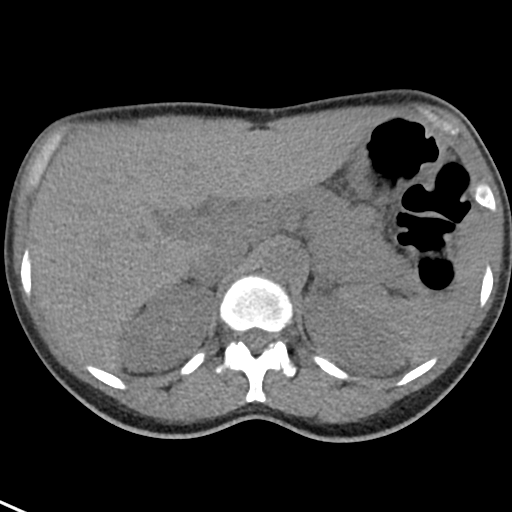
[im 73/88  soft-tissue]
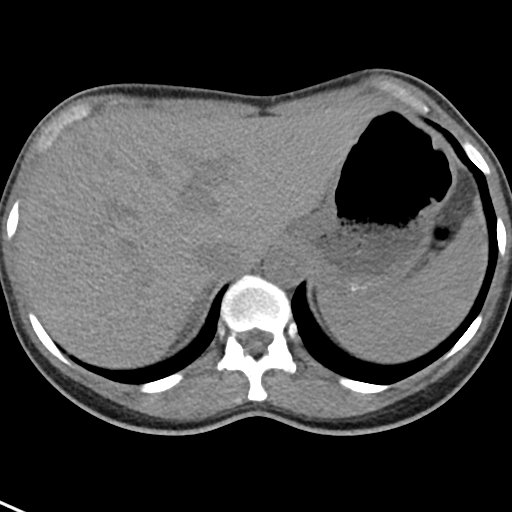
[im 83/88  soft-tissue]
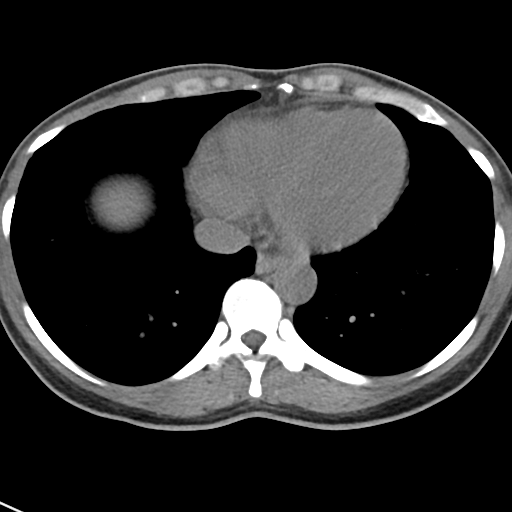

[Series 6: cor · coronal · 0.65mm/px · 3 of 78 slices shown]
[im 26/78  soft-tissue]
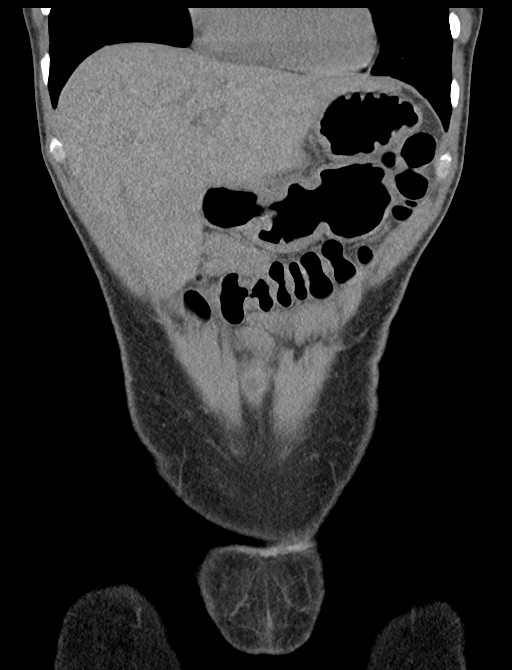
[im 35/78  soft-tissue]
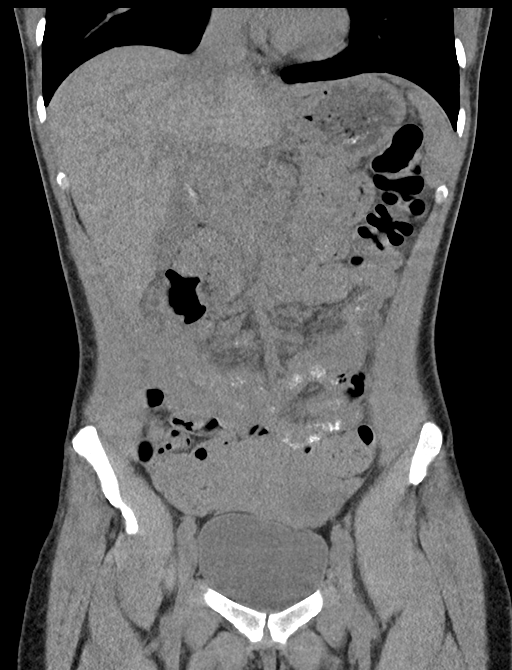
[im 43/78  soft-tissue]
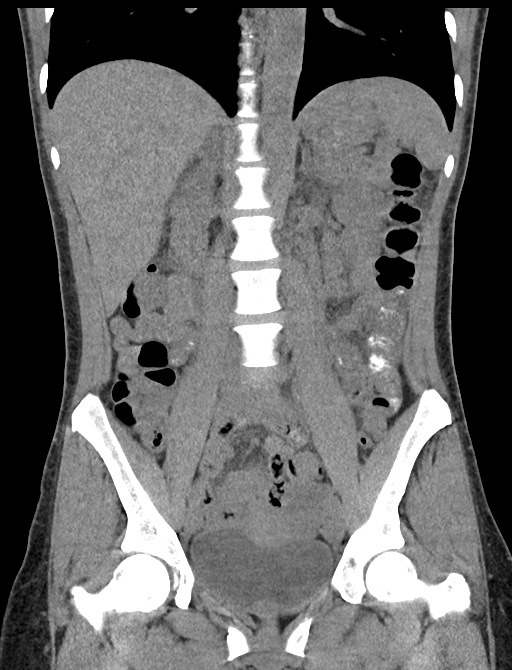

[15 of 46 positions shown; findings below may reference images not displayed]

FINDINGS: Lower chest: Stable and negative.

Hepatobiliary: Negative noncontrast liver. Layering mild increased
density in the gallbladder might reflect sludge (series 3, image
29). No pericholecystic inflammation.

Pancreas: Negative noncontrast pancreas.

Spleen: Negative.

Adrenals/Urinary Tract:

Normal adrenal glands.

There is a punctate calculus in the right renal lower pole on series
6, image 52. No other right nephrolithiasis. No right hydronephrosis
or perinephric stranding. No evidence of proximal right hydroureter.

Multiple bilateral pelvic phleboliths, more numerous on the left,
are stable since [REDACTED]. Unremarkable urinary bladder.

Negative noncontrast left kidney.

Stomach/Bowel: Decompressed descending and rectosigmoid colon.
Transverse colon appears within normal limits. Right colon is within
normal limits. A normal appendix is identified on coronal image 34.
Nearby terminal ileum appears normal. Ileocecal valve demonstrated
on series 3, image 52. No bowel inflammation is evident.

No abdominal free air, free fluid identified.

No dilated small bowel. There is a small amount of hyperdense
enteric material in the duodenum bulb and proximal small bowel loops
(series 3, image 44).

Vascular/Lymphatic: Vascular patency is not evaluated in the absence
of IV contrast. Trace Calcified aortic atherosclerosis.

Reproductive: Intermediate density in the left adnexa (series 3,
image 65), see pelvis ultrasound today reported separately.

Other: Trace if any pelvic free fluid.

Musculoskeletal: Negative.
IMPRESSION: 1. Punctate right lower pole nephrolithiasis with no secondary
changes of acute obstructive uropathy.
2. Normal appendix and otherwise negative noncontrast CT abdomen.
3. Asymmetric intermediate density in the left adnexa, see pelvis
ultrasound today reported separately.

## 2018-05-05 MED ORDER — METRONIDAZOLE 500 MG PO TABS
500.0000 mg | ORAL_TABLET | Freq: Once | ORAL | Status: AC
  Administered 2018-05-05: 500 mg via ORAL
  Filled 2018-05-05: qty 1

## 2018-05-05 MED ORDER — METRONIDAZOLE 500 MG PO TABS: 500.0000 mg | ORAL_TABLET | Freq: Two times a day (BID) | ORAL | 0 refills | Status: DC

## 2018-05-05 MED ORDER — MORPHINE SULFATE (PF) 4 MG/ML IV SOLN
6.0000 mg | Freq: Once | INTRAVENOUS | Status: AC
  Administered 2018-05-05: 6 mg via INTRAVENOUS
  Filled 2018-05-05: qty 2

## 2018-05-05 MED ORDER — ONDANSETRON 4 MG PO TBDP
4.0000 mg | ORAL_TABLET | Freq: Once | ORAL | Status: AC
  Administered 2018-05-05: 4 mg via ORAL
  Filled 2018-05-05: qty 1

## 2018-05-05 MED ORDER — ONDANSETRON HCL 4 MG/2ML IJ SOLN
4.0000 mg | Freq: Once | INTRAMUSCULAR | Status: AC
  Administered 2018-05-05: 4 mg via INTRAVENOUS
  Filled 2018-05-05: qty 2

## 2018-05-05 MED ORDER — STERILE WATER FOR INJECTION IJ SOLN
INTRAMUSCULAR | Status: AC
  Filled 2018-05-05: qty 10

## 2018-05-05 MED ORDER — DOXYCYCLINE HYCLATE 100 MG PO TABS
100.0000 mg | ORAL_TABLET | Freq: Once | ORAL | Status: AC
  Administered 2018-05-05: 100 mg via ORAL
  Filled 2018-05-05: qty 1

## 2018-05-05 MED ORDER — DOXYCYCLINE HYCLATE 100 MG PO CAPS: 100.0000 mg | ORAL_CAPSULE | Freq: Two times a day (BID) | ORAL | 0 refills | Status: AC

## 2018-05-05 MED ORDER — POTASSIUM CHLORIDE CRYS ER 20 MEQ PO TBCR
40.0000 meq | EXTENDED_RELEASE_TABLET | Freq: Once | ORAL | Status: AC
  Administered 2018-05-05: 40 meq via ORAL
  Filled 2018-05-05: qty 2

## 2018-05-05 MED ORDER — SODIUM CHLORIDE 0.9 % IV BOLUS
1000.0000 mL | Freq: Once | INTRAVENOUS | Status: AC
  Administered 2018-05-05: 1000 mL via INTRAVENOUS

## 2018-05-05 MED ORDER — ONDANSETRON 4 MG PO TBDP: 4.0000 mg | ORAL_TABLET | Freq: Three times a day (TID) | ORAL | 0 refills | Status: DC | PRN

## 2018-05-05 MED ORDER — CEFTRIAXONE SODIUM 250 MG IJ SOLR
250.0000 mg | Freq: Once | INTRAMUSCULAR | Status: AC
  Administered 2018-05-05: 250 mg via INTRAMUSCULAR
  Filled 2018-05-05: qty 250

## 2018-05-05 NOTE — ED Triage Notes (Signed)
Pt arrives from home with c/o of RLQ pain with n/v/d that started suddenly on Saturday night when it woke her up from her sleep. Pt reports 3 episodes of v/d since this am. Pt denies any urinary symptoms, no vaginal discharge.

## 2018-05-05 NOTE — Discharge Instructions (Signed)
Your left ovary was enlarged on ultrasound at CT imaging today.  You will need a repeat ultrasound in about 3 months.  Your primary care doctor can set this up.  If you develop worsening abdominal pain or if you develop fever, vomiting, or any other new/concerning symptoms or return to the ER for evaluation.  Finish the antibiotics, even if you are feeling better.

## 2018-05-05 NOTE — ED Notes (Signed)
Wasted 2mg  morphine with into sink  Systems developerAudrey RN.

## 2018-05-05 NOTE — ED Provider Notes (Signed)
[facility] EMERGENCY DEPARTMENT Provider Note   CSN: [id number] Arrival date & time: 05/05/18  4098     History   Chief Complaint Chief Complaint  Patient presents with  . Abdominal Pain    HPI Shirley [name] is a 42 y.o. female.  HPI  42 year old female presents with right lower abdominal pain.  Started 2 days ago.  Has been coming and going and feels like a sharp pain that feels like contractions.  Has also been around to her right flank in the mid axillary line above her pelvis.  Yesterday started having vomiting and diarrhea.  No fevers during this time.  Chronically has frequent urination but no new dysuria or hematuria.  Pain is rated as a 6/10.  No vaginal bleeding or discharge.  Past Medical History:  Diagnosis Date  . Anemia   . Bacteremia   . Blood dyscrasia    has to apply a lot of pressure to stop bleeding  . GERD (gastroesophageal reflux disease)   . Heart murmur    with pregnancy  . History of hiatal hernia   . Hypertension    4-5 years ago, no meds  . Kidney infection    patient states I might have a kidney infection  . Migraine    migraines  . Pyelonephritis     Patient Active Problem List   Diagnosis Date Noted  . Pyelonephritis 08/03/2017  . Bacteremia 08/03/2017  . Hypokalemia 08/03/2017  . Tobacco use 08/03/2017  . Acute postoperative pain 07/31/2016  . Menometrorrhagia 04/09/2016  . Anemia 04/09/2016  . Abnormal uterine bleeding (AUB) 04/09/2016  . Essential hypertension 11/17/2009    Past Surgical History:  Procedure Laterality Date  . ABDOMINAL HERNIA REPAIR    . CESAREAN SECTION     x 4  . CESAREAN SECTION     x 4  . ENDOMETRIAL ABLATION  07/31/2016  . HYSTEROSCOPY  07/31/2016   Procedure: HYSTEROSCOPY WITH HYDROTHERMAL ABLATION;  Surgeon: Marble Bing, MD;  Location: [facility];  Service: Gynecology;;     OB History    Gravida  5   Para  4   Term  3   Preterm  1   AB  1   Living  4     SAB        TAB      Ectopic      Multiple      Live Births           Obstetric Comments  c--section x 4. 36wk c-section x 1         Home Medications    Prior to Admission medications   Medication Sig Start Date End Date Taking? Authorizing Provider  acetaminophen (TYLENOL) 500 MG tablet Take 1,000 mg by mouth every 6 (six) hours as needed for mild pain or headache.   Yes [provider]  albuterol (PROVENTIL HFA;VENTOLIN HFA) 108 (90 Base) MCG/ACT inhaler Inhale 2 puffs into the lungs every 6 (six) hours as needed for wheezing or shortness of breath.   Yes [provider]  amLODipine (NORVASC) 2.5 MG tablet Take 2.5 mg by mouth daily. 01/08/18  Yes [provider]  linaclotide Karlene Einstein) 72 MCG capsule Take 1 capsule (72 mcg total) by mouth daily before breakfast. 03/14/18  Yes Danis, Andreas Blower, MD  Multiple Vitamins-Minerals (WOMENS DAILY FORMULA PO) Take 1 tablet by mouth daily.   Yes [provider]  omeprazole (PRILOSEC) 20 MG capsule Take 20 mg  by mouth 2 (two) times daily before a meal.   Yes [provider]  polyethylene glycol (MIRALAX / GLYCOLAX) packet Take 17 g by mouth daily as needed.   Yes [provider]  doxycycline (VIBRAMYCIN) 100 MG capsule Take 1 capsule (100 mg total) by mouth 2 (two) times daily for 14 days. 05/05/18 05/19/18  Pricilla Loveless, MD  metroNIDAZOLE (FLAGYL) 500 MG tablet Take 1 tablet (500 mg total) by mouth 2 (two) times daily. 05/05/18   Pricilla Loveless, MD  ondansetron (ZOFRAN ODT) 4 MG disintegrating tablet Take 1 tablet (4 mg total) by mouth every 8 (eight) hours as needed for nausea or vomiting. 05/05/18   Pricilla Loveless, MD    Family History Family History  Problem Relation Age of Onset  . Hypertension Mother   . Colon cancer Father 29  . Stroke Brother     Social History Social History   Tobacco Use  . Smoking status: Current Every Day Smoker    Packs/day: 0.50    Types: Cigarettes  .  Smokeless tobacco: Never Used  Substance Use Topics  . Alcohol use: No    Comment: occasionally  . Drug use: Yes    Types: Marijuana     Allergies   Lisinopril; Aspirin; and Ibuprofen   Review of Systems Review of Systems  Constitutional: Negative for fever.  Gastrointestinal: Positive for abdominal pain, diarrhea, nausea and vomiting.  Genitourinary: Negative for dysuria and hematuria.  Musculoskeletal: Negative for back pain.  All other systems reviewed and are negative.    Physical Exam Updated Vital Signs BP (!) 144/103 (BP Location: Right Arm)   Pulse 83   Temp 99 F (37.2 C) (Oral)   Resp 16   LMP  (LMP Unknown)   SpO2 100%   Physical Exam  Constitutional: She is oriented to person, place, and time. She appears well-developed and well-nourished. No distress.  HENT:  Head: Normocephalic and atraumatic.  Right Ear: External ear normal.  Left Ear: External ear normal.  Nose: Nose normal.  Eyes: Right eye exhibits no discharge. Left eye exhibits no discharge.  Cardiovascular: Normal rate, regular rhythm and normal heart sounds.  Pulmonary/Chest: Effort normal and breath sounds normal.  Abdominal: Soft. There is tenderness in the right upper quadrant, right lower quadrant and epigastric area. There is CVA tenderness (right).  Genitourinary: Cervix exhibits motion tenderness and friability (mild). Right adnexum displays tenderness. Vaginal discharge (scant) found.  Neurological: She is alert and oriented to person, place, and time.  Skin: Skin is warm and dry. She is not diaphoretic.  Nursing note and vitals reviewed.    ED Treatments / Results  Labs (all labs ordered are listed, but only abnormal results are displayed) Labs Reviewed  WET PREP, GENITAL - Abnormal; Notable for the following components:      Result Value   Trich, Wet Prep PRESENT (*)    Clue Cells Wet Prep HPF POC PRESENT (*)    WBC, Wet Prep HPF POC FEW (*)    All other components within  normal limits  URINALYSIS, ROUTINE W REFLEX MICROSCOPIC - Abnormal; Notable for the following components:   Hgb urine dipstick MODERATE (*)    Ketones, ur 5 (*)    Bacteria, UA RARE (*)    All other components within normal limits  COMPREHENSIVE METABOLIC PANEL - Abnormal; Notable for the following components:   Potassium 3.4 (*)    All other components within normal limits  CBC WITH DIFFERENTIAL/PLATELET - Abnormal; Notable for  the following components:   RBC 3.59 (*)    Hemoglobin 11.4 (*)    HCT 34.6 (*)    All other components within normal limits  LIPASE, BLOOD  POC URINE PREG, ED  GC/CHLAMYDIA PROBE AMP ([place]) NOT AT [facility]    EKG None  Radiology US Transvaginal Non-ob  Result Date: 05/05/2018 CLINICAL DATA:  Right lower quadrant pain. History of cesarean section x4. No menstruation since endometrial ablation 2 years prior. EXAM: TRANSABDOMINAL AND TRANSVAGINAL ULTRASOUND OF PELVIS DOPPLER ULTRASOUND OF OVARIES TECHNIQUE: Both transabdominal and transvaginal ultrasound examinations of the pelvis were performed. Transabdominal technique was performed for global imaging of the pelvis including uterus, ovaries, adnexal regions, and pelvic cul-de-sac. It was necessary to proceed with endovaginal exam following the transabdominal exam to visualize the endometrium and adnexa. Color and duplex Doppler ultrasound was utilized to evaluate blood flow to the ovaries. COMPARISON:  12/07/2017 CT abdomen/pelvis. 12/16/2015 pelvic sonogram. FINDINGS: Uterus Measurements: 8.5 x 3.7 x 5.1 cm. Mildly enlarged retroverted uterus. Mildly heterogeneous myometrium. These are nonspecific findings that may indicate diffuse adenomyosis. No uterine fibroids. Endometrium Thickness: 5 mm. No endometrial cavity fluid or focal endometrial mass. Right ovary Measurements: 3.1 x 1.8 x 1.9 cm. Normal appearance/no adnexal mass. Left ovary Measurements: 4.6 x 3.3 x 4.4 cm. There is a 3.4 x 3.2 x 3.6 cm left ovarian  mass with heterogeneous internal echoes and no convincing internal vascularity, new since 12/16/2015 pelvic sonogram study, potentially correlating with the 3.5 x 3.1 cm complex cyst visualized in the left adnexa on 11/20/2017 CT. Pulsed Doppler evaluation of both ovaries demonstrates normal low-resistance arterial and venous waveforms. Other findings No abnormal free fluid. IMPRESSION: 1. No evidence of adnexal torsion. 2. Left ovarian 3.6 cm mass with heterogeneous internal echoes and no convincing internal vascularity. This lesion is new since 2017 pelvic sonogram study. This lesion may correlate with the 3.5 cm complex cyst visualized in the left adnexa on 11/20/2017 CT. Differential includes left ovarian hemorrhagic cyst or endometrioma. Follow-up pelvic ultrasound advised in 3 months. 3. Mildly enlarged retroverted uterus with possible diffuse adenomyosis. No uterine fibroids. Electronically Signed   By: Delbert Phenix M.D.   On: 05/05/2018 10:59   US Pelvis Complete  Result Date: 05/05/2018 CLINICAL DATA:  Right lower quadrant pain. History of cesarean section x4. No menstruation since endometrial ablation 2 years prior. EXAM: TRANSABDOMINAL AND TRANSVAGINAL ULTRASOUND OF PELVIS DOPPLER ULTRASOUND OF OVARIES TECHNIQUE: Both transabdominal and transvaginal ultrasound examinations of the pelvis were performed. Transabdominal technique was performed for global imaging of the pelvis including uterus, ovaries, adnexal regions, and pelvic cul-de-sac. It was necessary to proceed with endovaginal exam following the transabdominal exam to visualize the endometrium and adnexa. Color and duplex Doppler ultrasound was utilized to evaluate blood flow to the ovaries. COMPARISON:  12/07/2017 CT abdomen/pelvis. 12/16/2015 pelvic sonogram. FINDINGS: Uterus Measurements: 8.5 x 3.7 x 5.1 cm. Mildly enlarged retroverted uterus. Mildly heterogeneous myometrium. These are nonspecific findings that may indicate diffuse  adenomyosis. No uterine fibroids. Endometrium Thickness: 5 mm. No endometrial cavity fluid or focal endometrial mass. Right ovary Measurements: 3.1 x 1.8 x 1.9 cm. Normal appearance/no adnexal mass. Left ovary Measurements: 4.6 x 3.3 x 4.4 cm. There is a 3.4 x 3.2 x 3.6 cm left ovarian mass with heterogeneous internal echoes and no convincing internal vascularity, new since 12/16/2015 pelvic sonogram study, potentially correlating with the 3.5 x 3.1 cm complex cyst visualized in the left adnexa on 11/20/2017 CT. Pulsed Doppler evaluation of  both ovaries demonstrates normal low-resistance arterial and venous waveforms. Other findings No abnormal free fluid. IMPRESSION: 1. No evidence of adnexal torsion. 2. Left ovarian 3.6 cm mass with heterogeneous internal echoes and no convincing internal vascularity. This lesion is new since 2017 pelvic sonogram study. This lesion may correlate with the 3.5 cm complex cyst visualized in the left adnexa on 11/20/2017 CT. Differential includes left ovarian hemorrhagic cyst or endometrioma. Follow-up pelvic ultrasound advised in 3 months. 3. Mildly enlarged retroverted uterus with possible diffuse adenomyosis. No uterine fibroids. Electronically Signed   By: Delbert Phenix M.D.   On: 05/05/2018 10:59   [place] Art/ven Flow Abd Pelv Doppler  Result Date: 05/05/2018 CLINICAL DATA:  Right lower quadrant pain. History of cesarean section x4. No menstruation since endometrial ablation 2 years prior. EXAM: TRANSABDOMINAL AND TRANSVAGINAL ULTRASOUND OF PELVIS DOPPLER ULTRASOUND OF OVARIES TECHNIQUE: Both transabdominal and transvaginal ultrasound examinations of the pelvis were performed. Transabdominal technique was performed for global imaging of the pelvis including uterus, ovaries, adnexal regions, and pelvic cul-de-sac. It was necessary to proceed with endovaginal exam following the transabdominal exam to visualize the endometrium and adnexa. Color and duplex Doppler ultrasound was  utilized to evaluate blood flow to the ovaries. COMPARISON:  12/07/2017 CT abdomen/pelvis. 12/16/2015 pelvic sonogram. FINDINGS: Uterus Measurements: 8.5 x 3.7 x 5.1 cm. Mildly enlarged retroverted uterus. Mildly heterogeneous myometrium. These are nonspecific findings that may indicate diffuse adenomyosis. No uterine fibroids. Endometrium Thickness: 5 mm. No endometrial cavity fluid or focal endometrial mass. Right ovary Measurements: 3.1 x 1.8 x 1.9 cm. Normal appearance/no adnexal mass. Left ovary Measurements: 4.6 x 3.3 x 4.4 cm. There is a 3.4 x 3.2 x 3.6 cm left ovarian mass with heterogeneous internal echoes and no convincing internal vascularity, new since 12/16/2015 pelvic sonogram study, potentially correlating with the 3.5 x 3.1 cm complex cyst visualized in the left adnexa on 11/20/2017 CT. Pulsed Doppler evaluation of both ovaries demonstrates normal low-resistance arterial and venous waveforms. Other findings No abnormal free fluid. IMPRESSION: 1. No evidence of adnexal torsion. 2. Left ovarian 3.6 cm mass with heterogeneous internal echoes and no convincing internal vascularity. This lesion is new since 2017 pelvic sonogram study. This lesion may correlate with the 3.5 cm complex cyst visualized in the left adnexa on 11/20/2017 CT. Differential includes left ovarian hemorrhagic cyst or endometrioma. Follow-up pelvic ultrasound advised in 3 months. 3. Mildly enlarged retroverted uterus with possible diffuse adenomyosis. No uterine fibroids. Electronically Signed   By: Delbert Phenix M.D.   On: 05/05/2018 10:59   Ct Renal Stone Study  Result Date: 05/05/2018 CLINICAL DATA:  42 year old female with right flank pain, right lower quadrant pain which began suddenly 2 days ago. EXAM: CT ABDOMEN AND PELVIS WITHOUT CONTRAST TECHNIQUE: Multidetector CT imaging of the abdomen and pelvis was performed following the standard protocol without IV contrast. COMPARISON:  Pelvis ultrasound earlier today. Lung bases  remain normal. No pericardial or pleural effusion. FINDINGS: Lower chest: Stable and negative. Hepatobiliary: Negative noncontrast liver. Layering mild increased density in the gallbladder might reflect sludge (series 3, image 29). No pericholecystic inflammation. Pancreas: Negative noncontrast pancreas. Spleen: Negative. Adrenals/Urinary Tract: Normal adrenal glands. There is a punctate calculus in the right renal lower pole on series 6, image 52. No other right nephrolithiasis. No right hydronephrosis or perinephric stranding. No evidence of proximal right hydroureter. Multiple bilateral pelvic phleboliths, more numerous on the left, are stable since March. Unremarkable urinary bladder. Negative noncontrast left kidney. Stomach/Bowel: Decompressed descending and rectosigmoid  colon. Transverse colon appears within normal limits. Right colon is within normal limits. A normal appendix is identified on coronal image 34. Nearby terminal ileum appears normal. Ileocecal valve demonstrated on series 3, image 52. No bowel inflammation is evident. No abdominal free air, free fluid identified. No dilated small bowel. There is a small amount of hyperdense enteric material in the duodenum bulb and proximal small bowel loops (series 3, image 44). Vascular/Lymphatic: Vascular patency is not evaluated in the absence of IV contrast. Trace Calcified aortic atherosclerosis. Reproductive: Intermediate density in the left adnexa (series 3, image 65), see pelvis ultrasound today reported separately. Other: Trace if any pelvic free fluid. Musculoskeletal: Negative. IMPRESSION: 1. Punctate right lower pole nephrolithiasis with no secondary changes of acute obstructive uropathy. 2. Normal appendix and otherwise negative noncontrast CT abdomen. 3. Asymmetric intermediate density in the left adnexa, see pelvis ultrasound today reported separately. Electronically Signed   By: Odessa FlemingH  Hall M.D.   On: 05/05/2018 12:59    Procedures Procedures  (including critical care time)  Medications Ordered in ED Medications  sterile water (preservative free) injection (has no administration in time range)  sodium chloride 0.9 % bolus 1,000 mL (0 mLs Intravenous Stopped 05/05/18 1143)  ondansetron (ZOFRAN) injection 4 mg (4 mg Intravenous Given 05/05/18 0914)  morphine 4 MG/ML injection 6 mg (6 mg Intravenous Given 05/05/18 0914)  cefTRIAXone (ROCEPHIN) injection 250 mg (250 mg Intramuscular Given 05/05/18 1153)  metroNIDAZOLE (FLAGYL) tablet 500 mg (500 mg Oral Given 05/05/18 1152)  doxycycline (VIBRA-TABS) tablet 100 mg (100 mg Oral Given 05/05/18 1152)  ondansetron (ZOFRAN-ODT) disintegrating tablet 4 mg (4 mg Oral Given 05/05/18 1153)  potassium chloride SA (K-DUR,KLOR-CON) CR tablet 40 mEq (40 mEq Oral Given 05/05/18 1152)  morphine 4 MG/ML injection 6 mg (6 mg Intravenous Given 05/05/18 1232)     Initial Impression / Assessment and Plan / ED Course  I have reviewed the triage vital signs and the nursing notes.  Pertinent labs & imaging results that were available during my care of the patient were reviewed by me and considered in my medical decision making (see chart for details).     Patient has minimal discharge on her GU exam but does have significant tenderness.  Thus ultrasound obtained and shows no obvious torsion or abscess.  However given her continued pain and vomiting and right CVA tenderness, CT obtained and shows no appendicitis or obstructing ureteral stone.  Thus this is probably PID.  She was informed of the abnormal results including trichomonas.  I will cover with Rocephin, Flagyl, and doxycycline.  No signs or symptoms of urinary tract infection.  Follow-up with PCP.  Discussed return precautions.  Final Clinical Impressions(s) / ED Diagnoses   Final diagnoses:  Abdominal pain, RLQ (right lower quadrant)  PID (acute pelvic inflammatory disease)  Trichimoniasis    ED Discharge Orders         Ordered    metroNIDAZOLE  (FLAGYL) 500 MG tablet  2 times daily     05/05/18 1316    ondansetron (ZOFRAN ODT) 4 MG disintegrating tablet  Every 8 hours PRN     05/05/18 1316    doxycycline (VIBRAMYCIN) 100 MG capsule  2 times daily     05/05/18 1316           Pricilla LovelessGoldston, Ilya Ess, MD 05/05/18 1625

## 2018-05-06 LAB — GC/CHLAMYDIA PROBE AMP (~~LOC~~) NOT AT ARMC
Chlamydia: NEGATIVE
Neisseria Gonorrhea: NEGATIVE

## 2018-06-03 ENCOUNTER — Encounter

## 2018-06-03 ENCOUNTER — Encounter: Payer: Self-pay | Admitting: Gastroenterology

## 2018-06-03 ENCOUNTER — Ambulatory Visit (INDEPENDENT_AMBULATORY_CARE_PROVIDER_SITE_OTHER): Payer: BLUE CROSS/BLUE SHIELD | Admitting: Gastroenterology

## 2018-06-03 VITALS — BP 120/60 | HR 98 | Ht 62.0 in | Wt 129.0 lb

## 2018-06-03 DIAGNOSIS — G43A0 Cyclical vomiting, not intractable: Secondary | ICD-10-CM | POA: Diagnosis not present

## 2018-06-03 DIAGNOSIS — R1010 Upper abdominal pain, unspecified: Secondary | ICD-10-CM

## 2018-06-03 DIAGNOSIS — R1115 Cyclical vomiting syndrome unrelated to migraine: Secondary | ICD-10-CM

## 2018-06-03 MED ORDER — METOCLOPRAMIDE HCL 5 MG PO TABS: 5.0000 mg | ORAL_TABLET | Freq: Four times a day (QID) | ORAL | 0 refills | Status: DC | PRN

## 2018-06-03 NOTE — Patient Instructions (Signed)
If you are age 41 or older, your body mass index should be between 23-30. Your Body mass index is 23.59 kg/m. If this is out of the aforementioned range listed, please consider follow up with your Primary Care Provider.  If you are age 41 or younger, your body mass index should be between 19-25. Your Body mass index is 23.59 kg/m. If this is out of the aformentioned range listed, please consider follow up with your Primary Care Provider.   Rx: Reglan was sent to the pharmacy.  Follow up as needed.  It was a pleasure to see you today!  Dr. Myrtie Neitheranis

## 2018-06-03 NOTE — Progress Notes (Signed)
[facility] Progress Note  Chief Complaint: Nausea and vomiting  Subjective  History:  Shirley [name] was seen in July for chronic upper abdominal pain with episodic nausea and vomiting.  [facility]  Upper endoscopy 04/11/2018 showed some mild mucosal changes in the stomach and duodenum.  Gastric biopsies were negative for H. pylori.  Biopsies from second portion duodenum showed some focal increased lymphocytes and villous blunting.  TTG IgA antibody normal and normal IgA level afterwards. Wt stable  She is improved overall from when I first saw her.  She might feel well for at least several days or a week at a time.  And she will have upper abdominal pain with vomiting that may last several hours or couple of days intermittently.  Would then go back to feeling well, and has not noticed any clear triggers for this.  She uses marijuana most days to improve her appetite, but does not think this is related to the symptoms.  ROS: Cardiovascular:  no chest pain Respiratory: no dyspnea  The patient's Past Medical, Family and Social History were reviewed and are on file in the EMR.  Objective:  Med list reviewed  Current Outpatient Medications:  .  acetaminophen (TYLENOL) 500 MG tablet, Take 1,000 mg by mouth every 6 (six) hours as needed for mild pain or headache., Disp: , Rfl:  .  albuterol (PROVENTIL HFA;VENTOLIN HFA) 108 (90 Base) MCG/ACT inhaler, Inhale 2 puffs into the lungs every 6 (six) hours as needed for wheezing or shortness of breath., Disp: , Rfl:  .  amLODipine (NORVASC) 2.5 MG tablet, Take 2.5 mg by mouth daily., Disp: , Rfl: 1 .  linaclotide (LINZESS) 72 MCG capsule, Take 1 capsule (72 mcg total) by mouth daily before breakfast., Disp: 8 capsule, Rfl: 0 .  Multiple Vitamins-Minerals (WOMENS DAILY FORMULA PO), Take 1 tablet by mouth daily., Disp: , Rfl:  .  omeprazole (PRILOSEC) 20 MG capsule, Take 20 mg by mouth 2 (two) times daily before  a meal., Disp: , Rfl:  .  ondansetron (ZOFRAN ODT) 4 MG disintegrating tablet, Take 1 tablet (4 mg total) by mouth every 8 (eight) hours as needed for nausea or vomiting., Disp: 20 tablet, Rfl: 0 .  polyethylene glycol (MIRALAX / GLYCOLAX) packet, Take 17 g by mouth daily as needed., Disp: , Rfl:  .  metoCLOPramide (REGLAN) 5 MG tablet, Take 1 tablet (5 mg total) by mouth every 6 (six) hours as needed for nausea., Disp: 20 tablet, Rfl: 0  Current Facility-Administered Medications:  .  0.9 %  sodium chloride infusion, 500 mL, Intravenous, Once, Danis, Starr LakeHenry L III, MD   Vital signs in last 24 hrs: Vitals:   06/03/18 1418  BP: 120/60  Pulse: 98    Physical Exam  She is well-appearing, pleasant and conversational  HEENT: sclera anicteric, oral mucosa moist without lesions  Neck: supple, no thyromegaly, JVD or lymphadenopathy  Cardiac: RRR without murmurs, S1S2 heard, no peripheral edema  Pulm: clear to auscultation bilaterally, normal RR and effort noted  Abdomen: soft, no tenderness, with active bowel sounds. No guarding or palpable hepatosplenomegaly.  Skin; warm and dry, no jaundice or rash  Recent Labs:  EKG 11/2017:  QTc 459   @ASSESSMENTPLANBEGIN @ Assessment: Encounter Diagnoses  Name Primary?  Marland Kitchen. Upper abdominal pain Yes  . Non-intractable cyclical vomiting with nausea    There is no structural cause for symptoms, no inflammatory condition or neoplasia.  I wonder  if she may have gastric dysrhythmia.  It seems unlikely to be gastroparesis without clear risk factors and have it has behaved.  Zofran helps if she can take it early enough when symptoms occur, but she may still have persistent vomiting even with that.  Plan: Trial of metoclopramide 5 mg every 6 hours as needed during periods of pain and vomiting.  She was cautioned against the potential side effects of this medicine quitting insomnia, anxiety, jitters, possible tardive dyskinesia.  I explained to describe  what that might look like.  If it occurs, she knows to take 50 mg of Benadryl, stop the metoclopramide and contact our office.  She will contact me in several weeks with an update on symptoms.  Total time 20 minutes, over half spent face-to-face with patient in counseling and coordination of care.   Charlie Pitter III

## 2018-06-17 ENCOUNTER — Other Ambulatory Visit: Payer: Self-pay

## 2018-06-17 ENCOUNTER — Emergency Department (HOSPITAL_COMMUNITY)
Admission: EM | Admit: 2018-06-17 | Discharge: 2018-06-17 | Disposition: A | Discharge status: Home / Self Care | Payer: BLUE CROSS/BLUE SHIELD | Attending: Emergency Medicine | Admitting: Emergency Medicine

## 2018-06-17 ENCOUNTER — Encounter (HOSPITAL_COMMUNITY): Payer: Self-pay

## 2018-06-17 DIAGNOSIS — F17210 Nicotine dependence, cigarettes, uncomplicated: Secondary | ICD-10-CM | POA: Insufficient documentation

## 2018-06-17 DIAGNOSIS — I10 Essential (primary) hypertension: Secondary | ICD-10-CM | POA: Diagnosis not present

## 2018-06-17 DIAGNOSIS — M545 Low back pain, unspecified: Secondary | ICD-10-CM

## 2018-06-17 DIAGNOSIS — R109 Unspecified abdominal pain: Secondary | ICD-10-CM | POA: Insufficient documentation

## 2018-06-17 LAB — CBC WITH DIFFERENTIAL/PLATELET
Abs Immature Granulocytes: 0.03 10*3/uL (ref 0.00–0.07)
Basophils Absolute: 0 10*3/uL (ref 0.0–0.1)
Basophils Relative: 0 %
Eosinophils Absolute: 0.5 10*3/uL (ref 0.0–0.5)
Eosinophils Relative: 7 %
HCT: 35.6 % — ABNORMAL LOW (ref 36.0–46.0)
Hemoglobin: 11.6 g/dL — ABNORMAL LOW (ref 12.0–15.0)
Immature Granulocytes: 0 %
Lymphocytes Relative: 27 %
Lymphs Abs: 2.1 10*3/uL (ref 0.7–4.0)
MCH: 31.3 pg (ref 26.0–34.0)
MCHC: 32.6 g/dL (ref 30.0–36.0)
MCV: 96 fL (ref 80.0–100.0)
Monocytes Absolute: 0.3 10*3/uL (ref 0.1–1.0)
Monocytes Relative: 4 %
Neutro Abs: 4.7 10*3/uL (ref 1.7–7.7)
Neutrophils Relative %: 62 %
Platelets: 422 10*3/uL — ABNORMAL HIGH (ref 150–400)
RBC: 3.71 MIL/uL — ABNORMAL LOW (ref 3.87–5.11)
RDW: 14 % (ref 11.5–15.5)
WBC: 7.6 10*3/uL (ref 4.0–10.5)
nRBC: 0 % (ref 0.0–0.2)

## 2018-06-17 LAB — COMPREHENSIVE METABOLIC PANEL
ALT: 13 U/L (ref 0–44)
AST: 18 U/L (ref 15–41)
Albumin: 4.2 g/dL (ref 3.5–5.0)
Alkaline Phosphatase: 46 U/L (ref 38–126)
Anion gap: 8 (ref 5–15)
BUN: 10 mg/dL (ref 6–20)
CO2: 24 mmol/L (ref 22–32)
Calcium: 9.3 mg/dL (ref 8.9–10.3)
Chloride: 110 mmol/L (ref 98–111)
Creatinine, Ser: 0.62 mg/dL (ref 0.44–1.00)
GFR calc Af Amer: >60 mL/min (ref >60)
GFR calc non Af Amer: >60 mL/min (ref >60)
Glucose, Bld: 95 mg/dL (ref 70–99)
Potassium: 3.3 mmol/L — ABNORMAL LOW (ref 3.5–5.1)
Sodium: 142 mmol/L (ref 135–145)
Total Bilirubin: 0.4 mg/dL (ref 0.3–1.2)
Total Protein: 7.4 g/dL (ref 6.5–8.1)

## 2018-06-17 LAB — URINALYSIS, ROUTINE W REFLEX MICROSCOPIC
Bacteria, UA: NONE SEEN
Bilirubin Urine: NEGATIVE
Glucose, UA: NEGATIVE mg/dL
Ketones, ur: NEGATIVE mg/dL
Leukocytes, UA: NEGATIVE
Nitrite: NEGATIVE
Protein, ur: NEGATIVE mg/dL
Specific Gravity, Urine: 1.016 (ref 1.005–1.030)
pH: 7 (ref 5.0–8.0)

## 2018-06-17 LAB — I-STAT BETA HCG BLOOD, ED (MC, WL, AP ONLY): I-stat hCG, quantitative: <5 m[IU]/mL (ref <5)

## 2018-06-17 MED ORDER — KETOROLAC TROMETHAMINE 30 MG/ML IJ SOLN
30.0000 mg | Freq: Once | INTRAMUSCULAR | Status: AC
  Administered 2018-06-17: 30 mg via INTRAMUSCULAR
  Filled 2018-06-17: qty 1

## 2018-06-17 MED ORDER — METHOCARBAMOL 500 MG PO TABS
500.0000 mg | ORAL_TABLET | Freq: Once | ORAL | Status: AC
  Administered 2018-06-17: 500 mg via ORAL
  Filled 2018-06-17: qty 1

## 2018-06-17 MED ORDER — METHOCARBAMOL 500 MG PO TABS: 500.0000 mg | ORAL_TABLET | Freq: Two times a day (BID) | ORAL | 0 refills | Status: DC

## 2018-06-17 NOTE — ED Notes (Signed)
Pt verbalizes understanding of d/c instructions. Prescriptions reviewed with patient. Pt ambulatory at d/c with all belongings and with family.   

## 2018-06-17 NOTE — ED Provider Notes (Signed)
[facility] EMERGENCY DEPARTMENT Provider Note   CSN: [id number] Arrival date & time: 06/17/18  1717    History   Chief Complaint Chief Complaint  Patient presents with  . Flank Pain  . Dysuria    HPI Shirley [name] is a 42 y.o. female.  HPI   42 year old female presents today with complaints of right lower back and flank pain.  Patient notes symptoms started proximal only 2 days ago.  She notes symptoms are worse with movement or palpation of the right lower back and flank.  She also notes pain in her suprapubic region after urinating, no dysuria.  She notes symptoms similar to previous urinary tract infections.  She denies any significant abdominal pain, vaginal discharge or bleeding.  She denies any fever chest pain or shortness of breath.  No medications prior to arrival.    Past Medical History:  Diagnosis Date  . Anemia   . Bacteremia   . Blood dyscrasia    has to apply a lot of pressure to stop bleeding  . GERD (gastroesophageal reflux disease)   . Heart murmur    with pregnancy  . History of hiatal hernia   . Hypertension    4-5 years ago, no meds  . Kidney infection    patient states I might have a kidney infection  . Migraine    migraines  . Pyelonephritis     Patient Active Problem List   Diagnosis Date Noted  . Pyelonephritis 08/03/2017  . Bacteremia 08/03/2017  . Hypokalemia 08/03/2017  . Tobacco use 08/03/2017  . Acute postoperative pain 07/31/2016  . Menometrorrhagia 04/09/2016  . Anemia 04/09/2016  . Abnormal uterine bleeding (AUB) 04/09/2016  . Essential hypertension 11/17/2009    Past Surgical History:  Procedure Laterality Date  . ABDOMINAL HERNIA REPAIR    . CESAREAN SECTION     x 4  . CESAREAN SECTION     x 4  . ENDOMETRIAL ABLATION  07/31/2016  . HYSTEROSCOPY  07/31/2016   Procedure: HYSTEROSCOPY WITH HYDROTHERMAL ABLATION;  Surgeon: [place] Bing, MD;  Location: [facility];  Service: Gynecology;;     OB  History    Gravida  5   Para  4   Term  3   Preterm  1   AB  1   Living  4     SAB      TAB      Ectopic      Multiple      Live Births           Obstetric Comments  c--section x 4. 36wk c-section x 1         Home Medications    Prior to Admission medications   Medication Sig Start Date End Date Taking? Authorizing Provider  acetaminophen (TYLENOL) 500 MG tablet Take 1,000 mg by mouth every 6 (six) hours as needed for mild pain or headache.    [provider]  albuterol (PROVENTIL HFA;VENTOLIN HFA) 108 (90 Base) MCG/ACT inhaler Inhale 2 puffs into the lungs every 6 (six) hours as needed for wheezing or shortness of breath.    [provider]  amLODipine (NORVASC) 2.5 MG tablet Take 2.5 mg by mouth daily. 01/08/18   [provider]  linaclotide Karlene Einstein) 72 MCG capsule Take 1 capsule (72 mcg total) by mouth daily before breakfast. 03/14/18   Danis, Andreas Blower, MD  methocarbamol (ROBAXIN) 500 MG tablet Take 1 tablet (500 mg total) by mouth 2 (two)  times daily. 06/17/18   Everlynn Sagun, Tinnie Gens, PA-C  metoCLOPramide (REGLAN) 5 MG tablet Take 1 tablet (5 mg total) by mouth every 6 (six) hours as needed for nausea. 06/03/18   Sherrilyn Rist, MD  Multiple Vitamins-Minerals (WOMENS DAILY FORMULA PO) Take 1 tablet by mouth daily.    [provider]  omeprazole (PRILOSEC) 20 MG capsule Take 20 mg by mouth 2 (two) times daily before a meal.    [provider]  ondansetron (ZOFRAN ODT) 4 MG disintegrating tablet Take 1 tablet (4 mg total) by mouth every 8 (eight) hours as needed for nausea or vomiting. 05/05/18   Pricilla Loveless, MD  polyethylene glycol (MIRALAX / GLYCOLAX) packet Take 17 g by mouth daily as needed.    [provider]    Family History Family History  Problem Relation Age of Onset  . Hypertension Mother   . Colon cancer Father 44  . Stroke Brother     Social History Social History   Tobacco Use  . Smoking  status: Current Every Day Smoker    Packs/day: 0.50    Types: Cigarettes  . Smokeless tobacco: Never Used  Substance Use Topics  . Alcohol use: No    Comment: occasionally  . Drug use: Yes    Types: Marijuana     Allergies   Lisinopril; Aspirin; and Ibuprofen   Review of Systems Review of Systems  All other systems reviewed and are negative.    Physical Exam Updated Vital Signs BP (!) 130/104 (BP Location: Right Arm)   Pulse 91   Temp 98.8 F (37.1 C) (Oral)   Resp 16   Ht 5\' 2"  (1.575 m)   Wt 55.8 kg   SpO2 100%   BMI 22.50 kg/m   Physical Exam  Constitutional: She is oriented to person, place, and time. She appears well-developed and well-nourished.  HENT:  Head: Normocephalic and atraumatic.  Eyes: Pupils are equal, round, and reactive to light. Conjunctivae are normal. Right eye exhibits no discharge. Left eye exhibits no discharge. No scleral icterus.  Neck: Normal range of motion. No JVD present. No tracheal deviation present.  Pulmonary/Chest: Effort normal. No stridor.  Abdominal: She exhibits no distension and no mass. There is no tenderness. There is no rebound and no guarding. No hernia.  Musculoskeletal:  Tenderness palpation of the right lower lumbar musculature and flank-no rashes  Neurological: She is alert and oriented to person, place, and time. Coordination normal.  Psychiatric: She has a normal mood and affect. Her behavior is normal. Judgment and thought content normal.  Nursing note and vitals reviewed.    ED Treatments / Results  Labs (all labs ordered are listed, but only abnormal results are displayed) Labs Reviewed  COMPREHENSIVE METABOLIC PANEL - Abnormal; Notable for the following components:      Result Value   Potassium 3.3 (*)    All other components within normal limits  CBC WITH DIFFERENTIAL/PLATELET - Abnormal; Notable for the following components:   RBC 3.71 (*)    Hemoglobin 11.6 (*)    HCT 35.6 (*)    Platelets 422 (*)     All other components within normal limits  URINALYSIS, ROUTINE W REFLEX MICROSCOPIC - Abnormal; Notable for the following components:   Hgb urine dipstick MODERATE (*)    All other components within normal limits  I-STAT BETA HCG BLOOD, ED ([facility], [facility], [facility] ONLY)    EKG None  Radiology No results found.  Procedures Procedures (including critical care  time)  Medications Ordered in ED Medications  ketorolac (TORADOL) 30 MG/ML injection 30 mg (30 mg Intramuscular Given 06/17/18 2056)  methocarbamol (ROBAXIN) tablet 500 mg (500 mg Oral Given 06/17/18 2056)     Initial Impression / Assessment and Plan / ED Course  I have reviewed the triage vital signs and the nursing notes.  Pertinent labs & imaging results that were available during my care of the patient were reviewed by me and considered in my medical decision making (see chart for details).    42 year old female presents today with likely musculoskeletal back pain.  She has exquisite tenderness to the lumbar musculature.  She has reassuring laboratory analysis with no signs of infectious etiology including urinary tract infection.  Low suspicion for kidney stones or intra-abdominal pathology.  Patient given Toradol and Robaxin here, discharged with Robaxin, close follow-up if symptoms persist or worsen.  She verbalized understanding and agreement to today's plan had no further questions or concerns.  Final Clinical Impressions(s) / ED Diagnoses   Final diagnoses:  Acute right-sided low back pain without sciatica    ED Discharge Orders         Ordered    methocarbamol (ROBAXIN) 500 MG tablet  2 times daily     06/17/18 2153           Eyvonne Mechanic, PA-C 06/17/18 2154    Doug Sou, MD 06/17/18 2244

## 2018-06-17 NOTE — Discharge Instructions (Addendum)
Please read attached information. If you experience any new or worsening signs or symptoms please return to the emergency room for evaluation. Please follow-up with your primary care provider or specialist as discussed. Please use medication prescribed only as directed and discontinue taking if you have any concerning signs or symptoms.   °

## 2018-06-17 NOTE — ED Triage Notes (Signed)
Pt endorses right side flank pain and pelvic pain after urination x 2 days. Has hx of 4 kidney infections and this feels the same.

## 2018-08-15 DIAGNOSIS — I10 Essential (primary) hypertension: Secondary | ICD-10-CM | POA: Diagnosis not present

## 2018-08-15 DIAGNOSIS — N390 Urinary tract infection, site not specified: Secondary | ICD-10-CM | POA: Diagnosis not present

## 2018-09-10 ENCOUNTER — Emergency Department (HOSPITAL_COMMUNITY): Payer: BLUE CROSS/BLUE SHIELD

## 2018-09-10 ENCOUNTER — Encounter (HOSPITAL_COMMUNITY): Payer: Self-pay | Admitting: Emergency Medicine

## 2018-09-10 ENCOUNTER — Emergency Department (HOSPITAL_COMMUNITY)
Admission: EM | Admit: 2018-09-10 | Discharge: 2018-09-11 | Disposition: A | Discharge status: Home / Self Care | Payer: BLUE CROSS/BLUE SHIELD | Attending: Emergency Medicine | Admitting: Emergency Medicine

## 2018-09-10 DIAGNOSIS — F17210 Nicotine dependence, cigarettes, uncomplicated: Secondary | ICD-10-CM | POA: Diagnosis not present

## 2018-09-10 DIAGNOSIS — R064 Hyperventilation: Secondary | ICD-10-CM | POA: Diagnosis not present

## 2018-09-10 DIAGNOSIS — R1012 Left upper quadrant pain: Secondary | ICD-10-CM | POA: Insufficient documentation

## 2018-09-10 DIAGNOSIS — R079 Chest pain, unspecified: Secondary | ICD-10-CM | POA: Diagnosis not present

## 2018-09-10 DIAGNOSIS — R0789 Other chest pain: Secondary | ICD-10-CM | POA: Diagnosis not present

## 2018-09-10 DIAGNOSIS — I10 Essential (primary) hypertension: Secondary | ICD-10-CM | POA: Diagnosis not present

## 2018-09-10 DIAGNOSIS — Z79899 Other long term (current) drug therapy: Secondary | ICD-10-CM | POA: Insufficient documentation

## 2018-09-10 DIAGNOSIS — R1032 Left lower quadrant pain: Secondary | ICD-10-CM | POA: Diagnosis not present

## 2018-09-10 DIAGNOSIS — R0602 Shortness of breath: Secondary | ICD-10-CM | POA: Diagnosis not present

## 2018-09-10 DIAGNOSIS — R110 Nausea: Secondary | ICD-10-CM | POA: Diagnosis not present

## 2018-09-10 LAB — HEPATIC FUNCTION PANEL
ALT: 10 U/L (ref 0–44)
AST: 18 U/L (ref 15–41)
Albumin: 4.1 g/dL (ref 3.5–5.0)
Alkaline Phosphatase: 42 U/L (ref 38–126)
Bilirubin, Direct: 0.2 mg/dL (ref 0.0–0.2)
Indirect Bilirubin: 0.1 mg/dL — ABNORMAL LOW (ref 0.3–0.9)
Total Bilirubin: 0.3 mg/dL (ref 0.3–1.2)
Total Protein: 7.4 g/dL (ref 6.5–8.1)

## 2018-09-10 LAB — BASIC METABOLIC PANEL
Anion gap: 10 (ref 5–15)
BUN: 13 mg/dL (ref 6–20)
CO2: 20 mmol/L — ABNORMAL LOW (ref 22–32)
Calcium: 9.1 mg/dL (ref 8.9–10.3)
Chloride: 109 mmol/L (ref 98–111)
Creatinine, Ser: 0.73 mg/dL (ref 0.44–1.00)
GFR calc Af Amer: >60 mL/min (ref >60)
GFR calc non Af Amer: >60 mL/min (ref >60)
Glucose, Bld: 96 mg/dL (ref 70–99)
Potassium: 3.3 mmol/L — ABNORMAL LOW (ref 3.5–5.1)
Sodium: 139 mmol/L (ref 135–145)

## 2018-09-10 LAB — I-STAT TROPONIN, ED: Troponin i, poc: 0 ng/mL (ref 0.00–0.08)

## 2018-09-10 LAB — CBC
HCT: 33.3 % — ABNORMAL LOW (ref 36.0–46.0)
Hemoglobin: 11.1 g/dL — ABNORMAL LOW (ref 12.0–15.0)
MCH: 31.2 pg (ref 26.0–34.0)
MCHC: 33.3 g/dL (ref 30.0–36.0)
MCV: 93.5 fL (ref 80.0–100.0)
Platelets: 390 10*3/uL (ref 150–400)
RBC: 3.56 MIL/uL — ABNORMAL LOW (ref 3.87–5.11)
RDW: 13.9 % (ref 11.5–15.5)
WBC: 4.6 10*3/uL (ref 4.0–10.5)
nRBC: 0 % (ref 0.0–0.2)

## 2018-09-10 LAB — I-STAT BETA HCG BLOOD, ED (MC, WL, AP ONLY): I-stat hCG, quantitative: <5 m[IU]/mL (ref <5)

## 2018-09-10 IMAGING — CR DG CHEST 2V
2 series · 2 of 2 positions shown · non-contrast
Comparison: Chest radiograph performed [DATE]

CLINICAL DATA: Acute onset of left-sided chest pain. Shortness of
breath, nausea and vomiting.

EXAM:
CHEST - 2 VIEW

[chest lat]
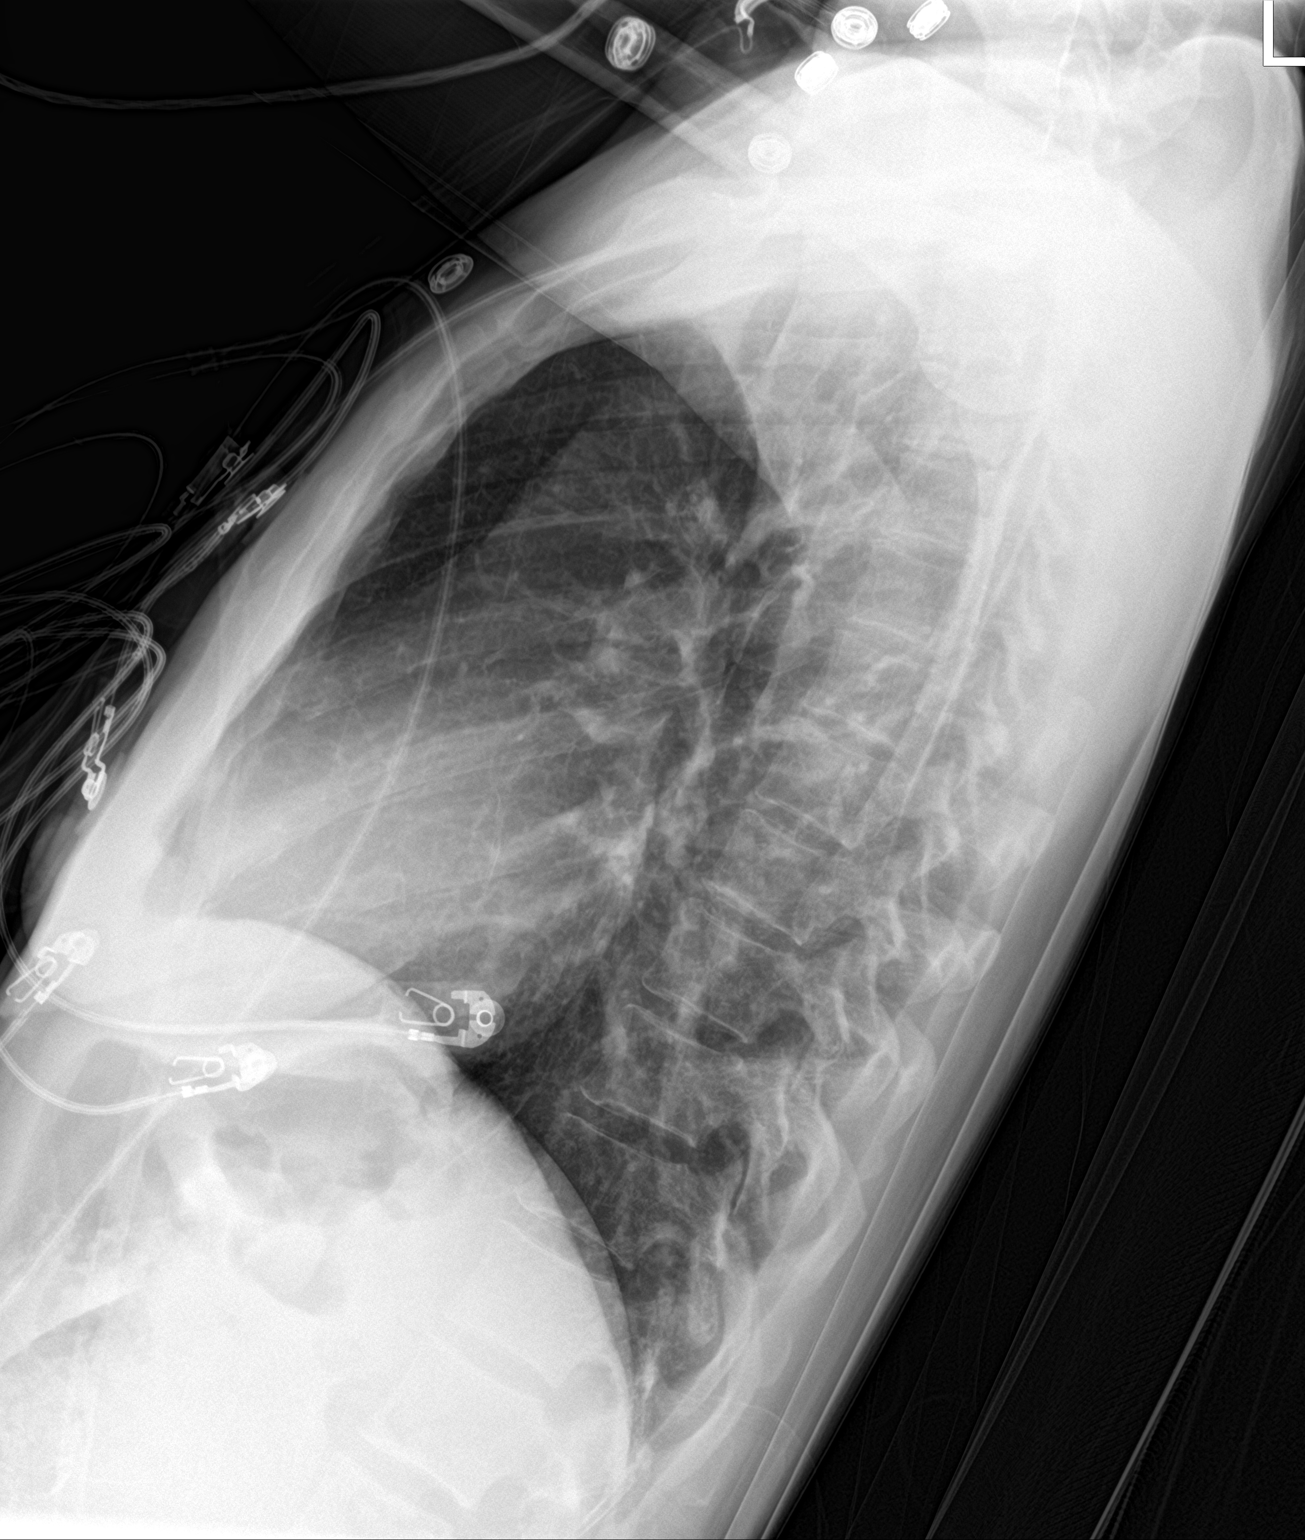

[chest ap]
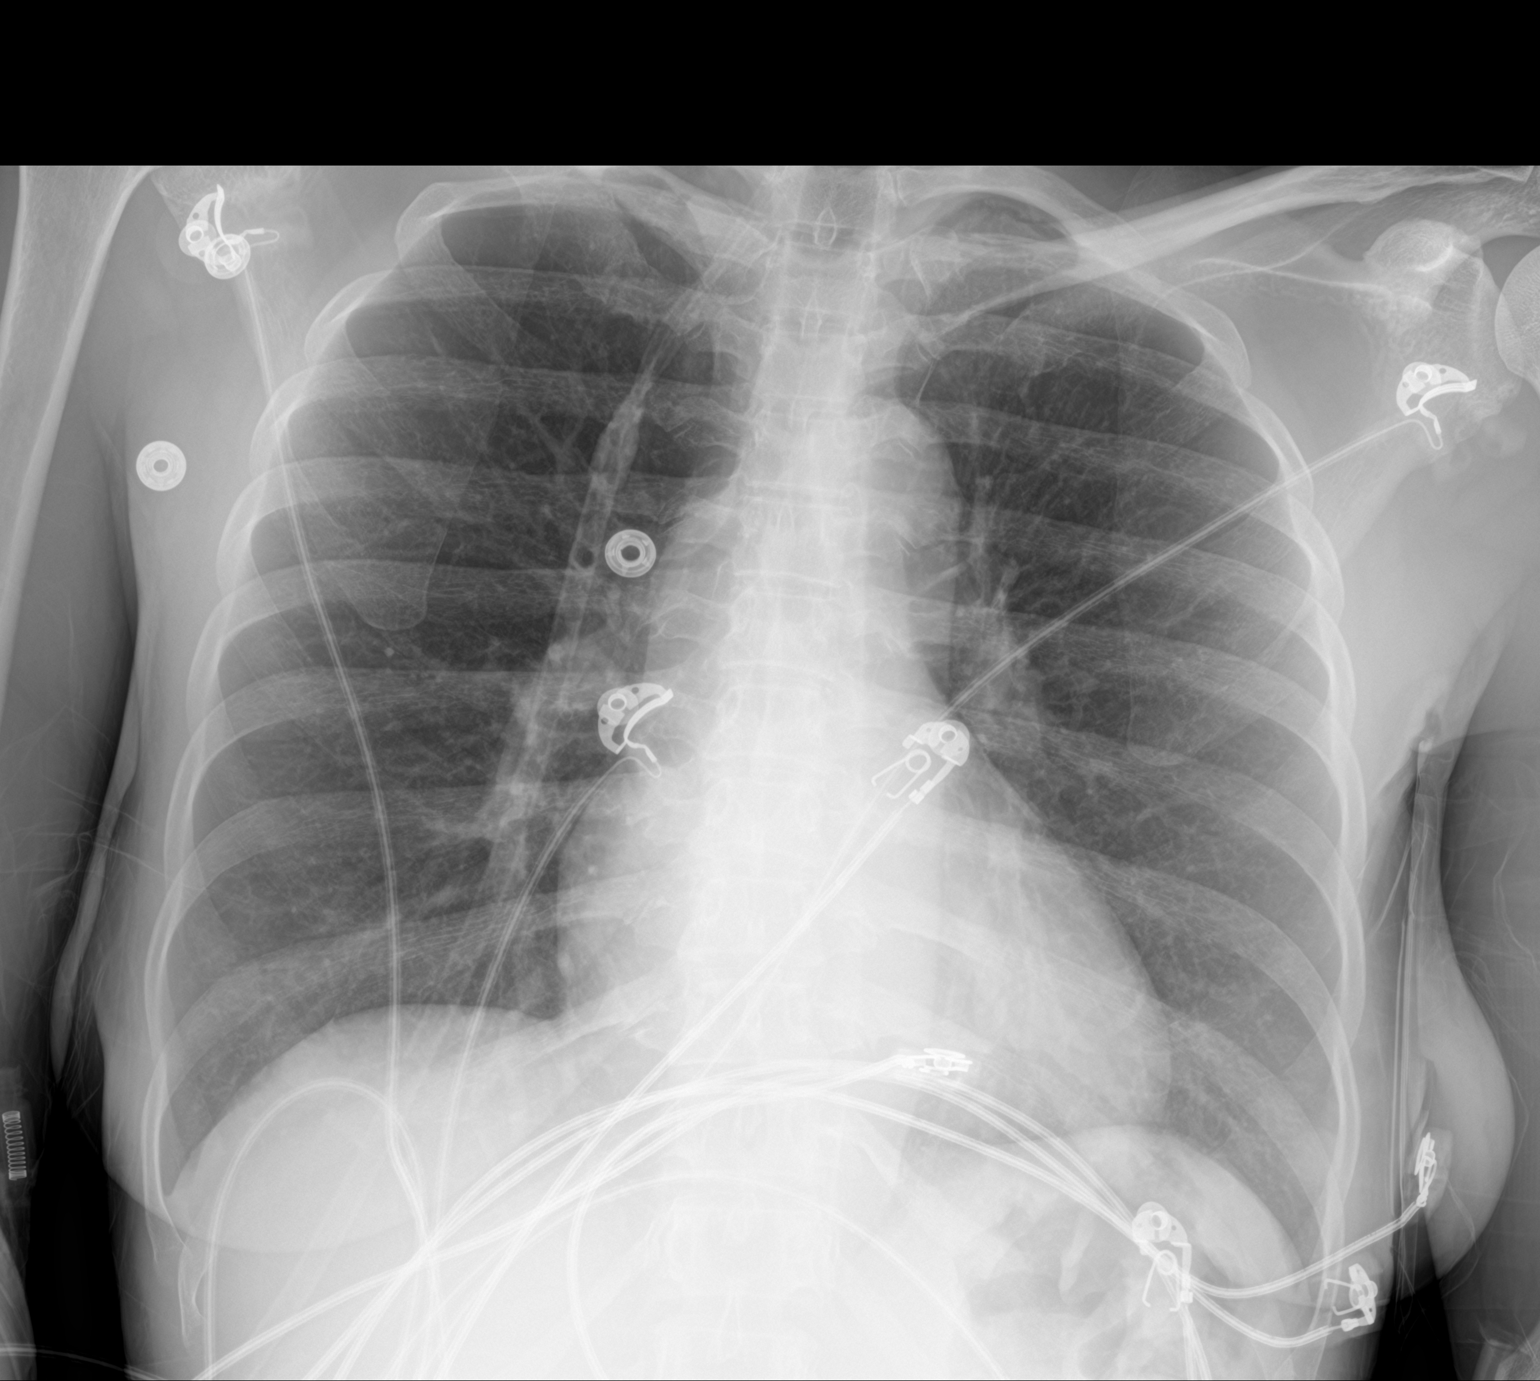

[2 of 2 positions shown; findings below may reference images not displayed]

FINDINGS: The lungs are well-aerated and clear. There is no evidence of focal
opacification, pleural effusion or pneumothorax.

The heart is normal in size; the mediastinal contour is within
normal limits. No acute osseous abnormalities are seen.
IMPRESSION: No acute cardiopulmonary process seen.

## 2018-09-10 IMAGING — CT CT ABD-PELV W/ CM
3 of 5 series · 16 of 46 positions shown, 18 images · IV contrast (omnipaque)
Comparison: [DATE]

CLINICAL DATA: Chest pain and nausea

EXAM:
CT ABDOMEN AND PELVIS WITH CONTRAST
TECHNIQUE: Multidetector CT imaging of the abdomen and pelvis was performed
using the standard protocol following bolus administration of
intravenous contrast.
CONTRAST:  100mL OMNIPAQUE IOHEXOL 300 MG/ML  SOLN

[Series 3: abdomen 5.0 · axial · 0.61mm/px · z∈[+883,+1238]mm · 11 of 87 slices shown, 13 images]
[im 8/87  soft-tissue]
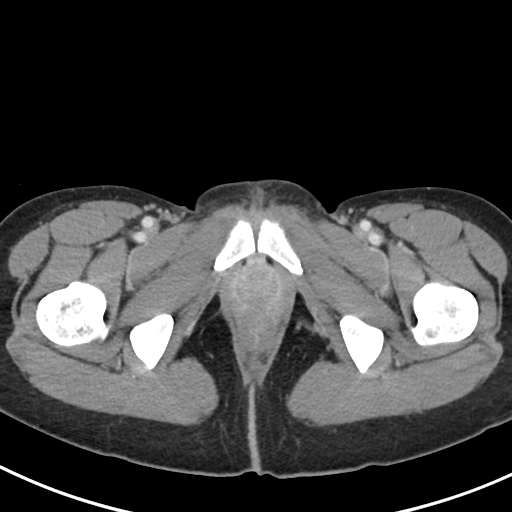
[im 8/87  bone]
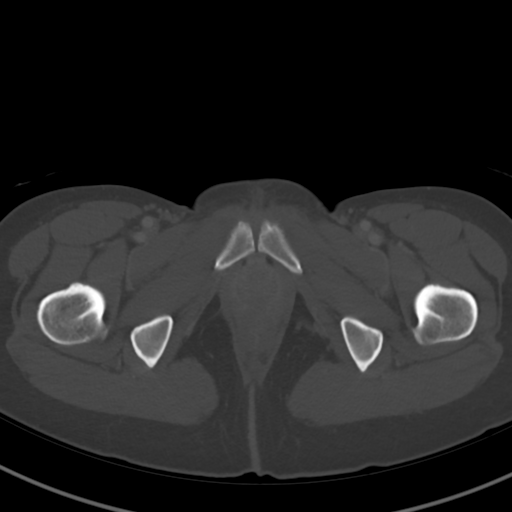
[im 15/87  soft-tissue]
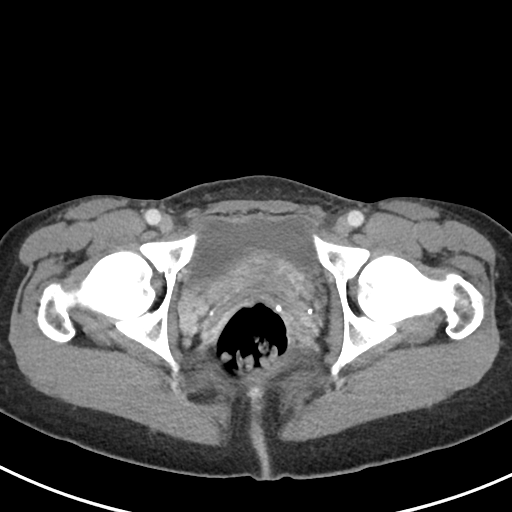
[im 22/87  soft-tissue]
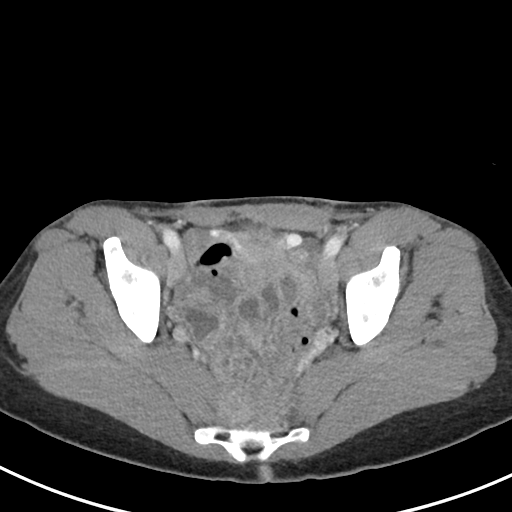
[im 29/87  soft-tissue]
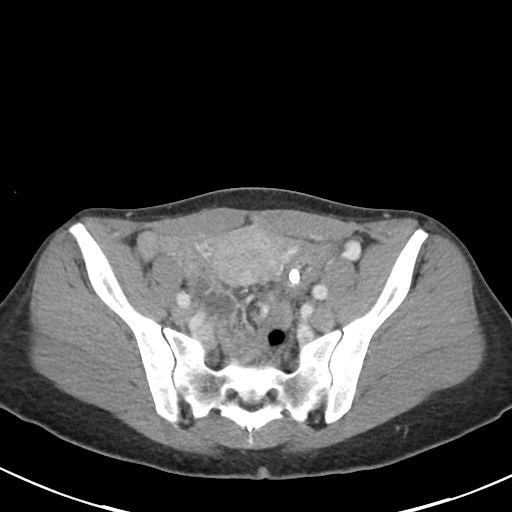
[im 36/87  soft-tissue]
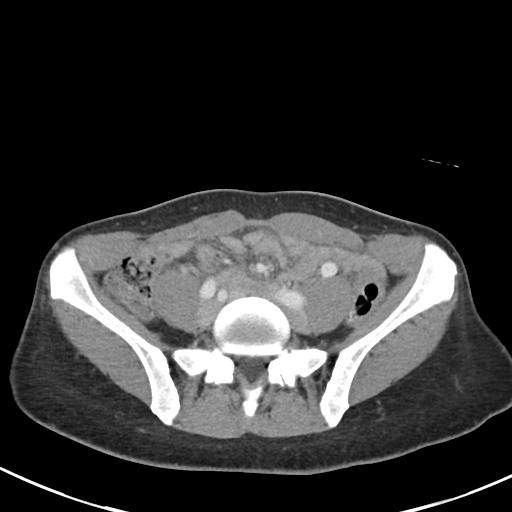
[im 44/87  soft-tissue]
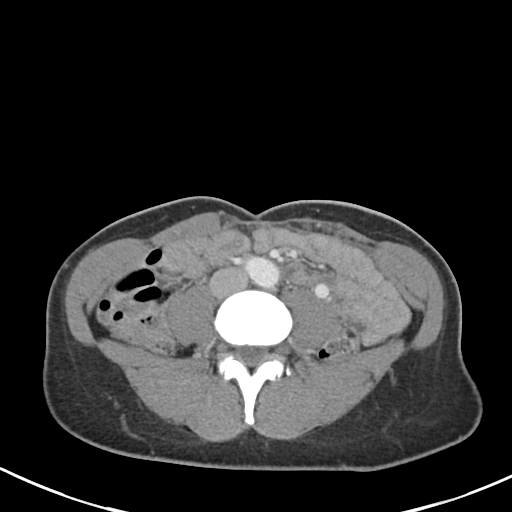
[im 51/87  soft-tissue]
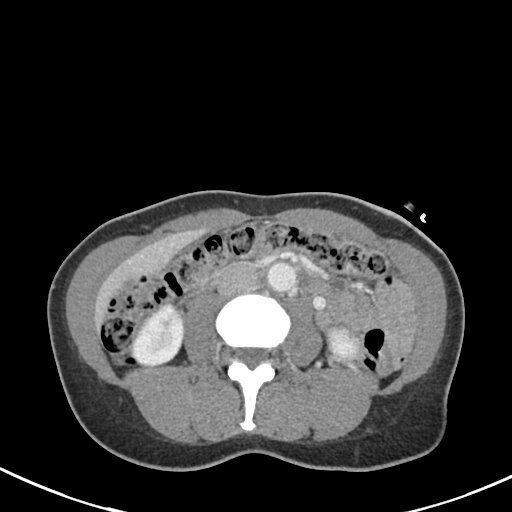
[im 58/87  soft-tissue]
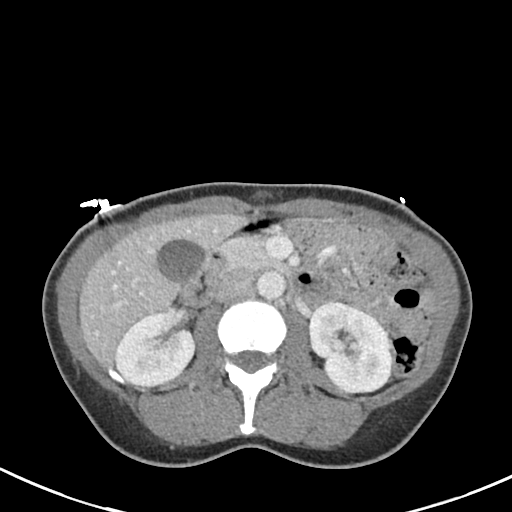
[im 65/87  soft-tissue]
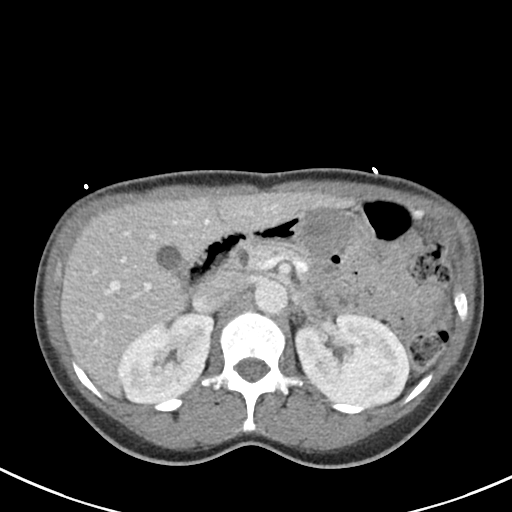
[im 65/87  bone]
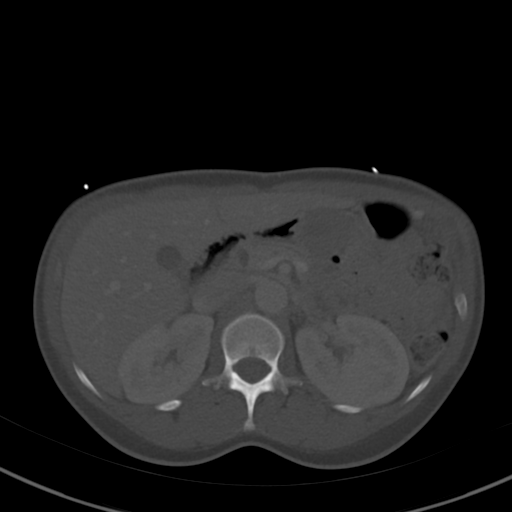
[im 72/87  soft-tissue]
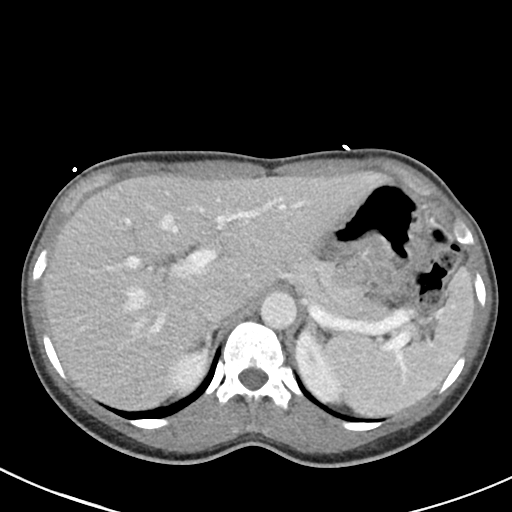
[im 79/87  soft-tissue]
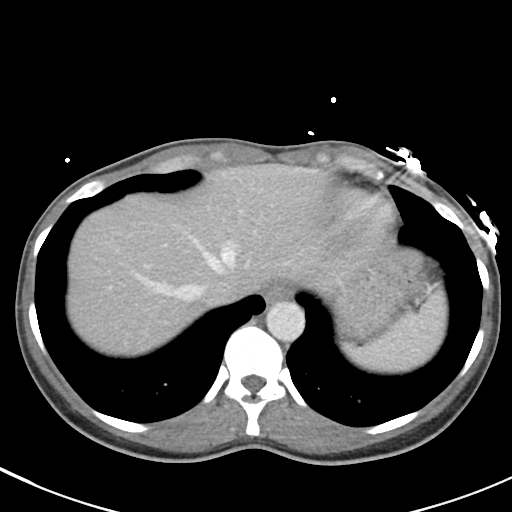

[Series 5: lung · axial · 0.61mm/px · z∈[+1208,+1226]mm · 2 of 44 slices shown]
[im 9/44  bone]
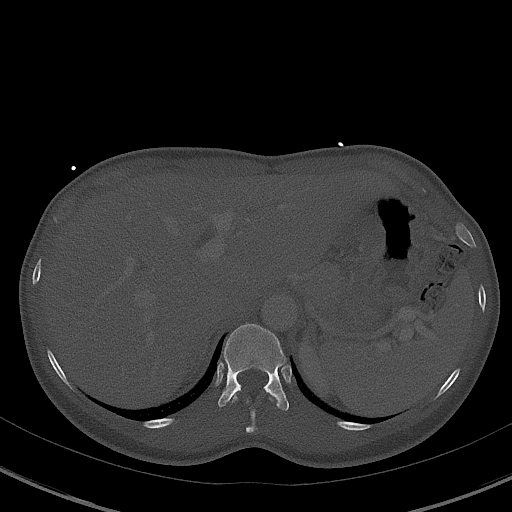
[im 18/44  bone]
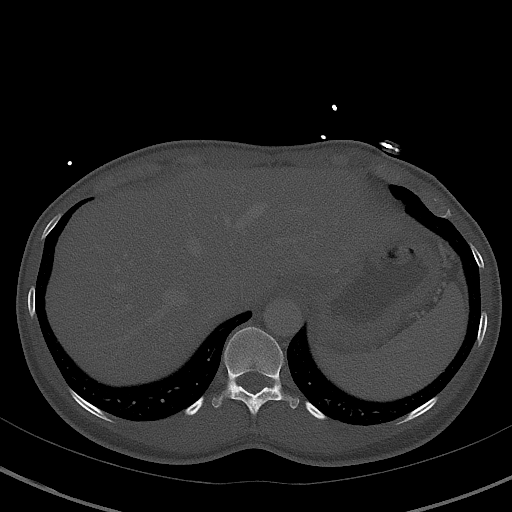

[Series 6: abdomen 3.0 mpr cor · coronal · 0.63mm/px · 3 of 69 slices shown]
[im 23/69  soft-tissue]
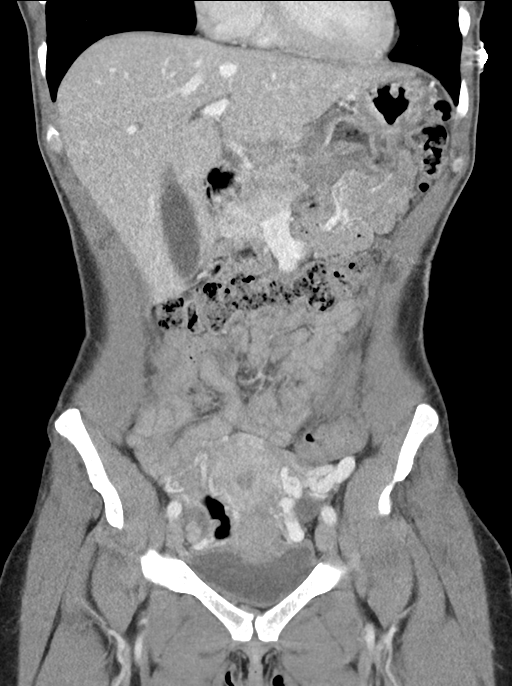
[im 31/69  soft-tissue]
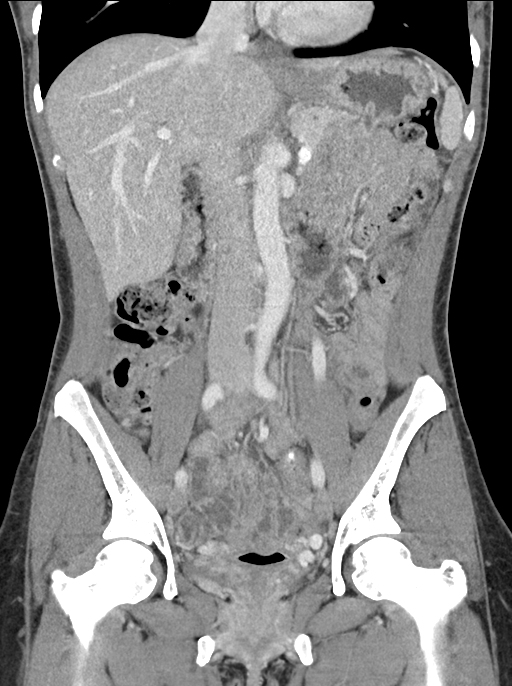
[im 38/69  soft-tissue]
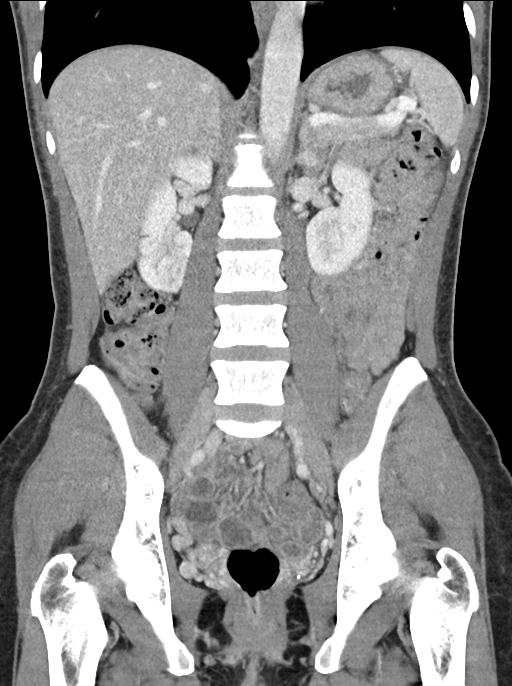

[16 of 46 positions shown; findings below may reference images not displayed]

FINDINGS: LOWER CHEST: There is no basilar pleural or apical pericardial
effusion.

HEPATOBILIARY: The hepatic contours and density are normal. There is
no intra- or extrahepatic biliary dilatation. The gallbladder is
normal.

PANCREAS: The pancreatic parenchymal contours are normal and there
is no ductal dilatation. There is no peripancreatic fluid
collection.

SPLEEN: Normal.

ADRENALS/URINARY TRACT:

--Adrenal glands: Normal.

--Right kidney/ureter: No hydronephrosis, nephroureterolithiasis,
perinephric stranding or solid renal mass.

--Left kidney/ureter: No hydronephrosis, nephroureterolithiasis,
perinephric stranding or solid renal mass.

--Urinary bladder: Normal for degree of distention

STOMACH/BOWEL:

--Stomach/Duodenum: There is no hiatal hernia or other gastric
abnormality. The duodenal course and caliber are normal.

--Small bowel: No dilatation or inflammation.

--Colon: No focal abnormality.

--Appendix: Normal.

VASCULAR/LYMPHATIC: Normal course and caliber of the major abdominal
vessels. No abdominal or pelvic lymphadenopathy.

REPRODUCTIVE: Normal uterus and ovaries.

MUSCULOSKELETAL. No bony spinal canal stenosis or focal osseous
abnormality.

OTHER: None.
IMPRESSION: No acute abdominal or pelvic abnormality.

## 2018-09-10 MED ORDER — ALUM & MAG HYDROXIDE-SIMETH 200-200-20 MG/5ML PO SUSP
15.0000 mL | Freq: Once | ORAL | Status: AC
  Administered 2018-09-11: 15 mL via ORAL
  Filled 2018-09-10: qty 30

## 2018-09-10 MED ORDER — ONDANSETRON HCL 4 MG/2ML IJ SOLN
4.0000 mg | Freq: Once | INTRAMUSCULAR | Status: AC
  Administered 2018-09-10: 4 mg via INTRAVENOUS
  Filled 2018-09-10: qty 2

## 2018-09-10 MED ORDER — MORPHINE SULFATE (PF) 4 MG/ML IV SOLN
4.0000 mg | Freq: Once | INTRAVENOUS | Status: AC
  Administered 2018-09-10: 4 mg via INTRAVENOUS
  Filled 2018-09-10: qty 1

## 2018-09-10 MED ORDER — IOHEXOL 300 MG/ML  SOLN
100.0000 mL | Freq: Once | INTRAMUSCULAR | Status: AC | PRN
  Administered 2018-09-10: 100 mL via INTRAVENOUS

## 2018-09-10 NOTE — Discharge Instructions (Signed)
TAKE 8 CAPFULS OF MIRALAX IN A 32 OUNCE GATORADE AND DRINK THE WHOLE BEVERAGE   Follow up with your PCP and GI. Return for worsening pain, fever, inability to eat or drink.

## 2018-09-10 NOTE — ED Provider Notes (Signed)
[facility] EMERGENCY DEPARTMENT Provider Note   CSN: [id number] Arrival date & time: 09/10/18  2123     History   Chief Complaint Chief Complaint  Patient presents with  . Chest Pain    HPI Shirley [name] is a 43 y.o. female.  43 yo F with a chief complaint of left-sided chest pain.  She points to the costal margin on the left side.  Described as sharp and severe.  Started about 2 or 3 hours ago.  Occurred at rest.  She has been having pain like this off and on for the past couple weeks.  She later states is been going on since Christmas Eve.  Denies fevers or chills.  Has not been able to eat or drink anything.  She denies history of PE or DVT.  Denies hemoptysis denies cough congestion or fever denies recent immobilization or surgery.  Denies recent prolonged travel.  Denies estrogen use.  Denies history of cancer.  She has a history of hypertension, denies hyperlipidemia diabetes or family history of MI.  She is a current smoker.  The history is provided by the patient.  Chest Pain   This is a new problem. The current episode started 1 to 2 hours ago. The problem occurs constantly. The problem has not changed since onset.The pain is associated with rest. The pain is present in the lateral region. The pain is at a severity of 8/10. The pain is moderate. The quality of the pain is described as sharp. The pain does not radiate. Duration of episode(s) is 2 hours. Associated symptoms include shortness of breath. Pertinent negatives include no abdominal pain, no dizziness, no fever, no headaches, no nausea, no palpitations and no vomiting. She has tried nothing for the symptoms. The treatment provided no relief. Risk factors include smoking/tobacco exposure.  Her past medical history is significant for hypertension.  Pertinent negatives for past medical history include no diabetes, no DVT, no hyperlipidemia, no MI and no PE.  Pertinent negatives for family medical history  include: no early MI.    Past Medical History:  Diagnosis Date  . Anemia   . Bacteremia   . Blood dyscrasia    has to apply a lot of pressure to stop bleeding  . GERD (gastroesophageal reflux disease)   . Heart murmur    with pregnancy  . History of hiatal hernia   . Hypertension    4-5 years ago, no meds  . Kidney infection    patient states I might have a kidney infection  . Migraine    migraines  . Pyelonephritis     Patient Active Problem List   Diagnosis Date Noted  . Pyelonephritis 08/03/2017  . Bacteremia 08/03/2017  . Hypokalemia 08/03/2017  . Tobacco use 08/03/2017  . Acute postoperative pain 07/31/2016  . Menometrorrhagia 04/09/2016  . Anemia 04/09/2016  . Abnormal uterine bleeding (AUB) 04/09/2016  . Essential hypertension 11/17/2009    Past Surgical History:  Procedure Laterality Date  . ABDOMINAL HERNIA REPAIR    . CESAREAN SECTION     x 4  . CESAREAN SECTION     x 4  . ENDOMETRIAL ABLATION  07/31/2016  . HYSTEROSCOPY  07/31/2016   Procedure: HYSTEROSCOPY WITH HYDROTHERMAL ABLATION;  Surgeon: Viera West Bing, MD;  Location: [facility];  Service: Gynecology;;     OB History    Gravida  5   Para  4   Term  3   Preterm  1   AB  1   Living  4     SAB      TAB      Ectopic      Multiple      Live Births           Obstetric Comments  c--section x 4. 36wk c-section x 1         Home Medications    Prior to Admission medications   Medication Sig Start Date End Date Taking? Authorizing Provider  acetaminophen (TYLENOL) 500 MG tablet Take 1,000 mg by mouth every 6 (six) hours as needed for mild pain or headache.   Yes [provider]  albuterol (PROVENTIL HFA;VENTOLIN HFA) 108 (90 Base) MCG/ACT inhaler Inhale 2 puffs into the lungs every 6 (six) hours as needed for wheezing or shortness of breath.   Yes [provider]  amLODipine (NORVASC) 2.5 MG tablet Take 2.5 mg by mouth daily. 01/08/18  Yes [provider]  linaclotide Karlene Einstein(LINZESS) 72 MCG capsule Take 1 capsule (72 mcg total) by mouth daily before breakfast. 03/14/18  Yes Danis, Andreas BlowerHenry L III, MD  Multiple Vitamins-Minerals (WOMENS DAILY FORMULA PO) Take 1 tablet by mouth daily.   Yes [provider]  omeprazole (PRILOSEC) 20 MG capsule Take 20 mg by mouth 2 (two) times daily before a meal.   Yes [provider]  ondansetron (ZOFRAN ODT) 4 MG disintegrating tablet Take 1 tablet (4 mg total) by mouth every 8 (eight) hours as needed for nausea or vomiting. 05/05/18  Yes Pricilla LovelessGoldston, Scott, MD  polyethylene glycol (MIRALAX / GLYCOLAX) packet Take 17 g by mouth daily as needed for mild constipation.    Yes [provider]  methocarbamol (ROBAXIN) 500 MG tablet Take 1 tablet (500 mg total) by mouth 2 (two) times daily. Patient not taking: Reported on 09/10/2018 06/17/18   Hedges, Tinnie GensJeffrey, PA-C  metoCLOPramide (REGLAN) 5 MG tablet Take 1 tablet (5 mg total) by mouth every 6 (six) hours as needed for nausea. 06/03/18   Sherrilyn Ristanis, Henry L III, MD    Family History Family History  Problem Relation Age of Onset  . Hypertension Mother   . Colon cancer Father [phone]  . Stroke Brother     Social History Social History   Tobacco Use  . Smoking status: Current Every Day Smoker    Packs/day: 0.50    Types: Cigarettes  . Smokeless tobacco: Never Used  Substance Use Topics  . Alcohol use: No    Comment: occasionally  . Drug use: Yes    Types: Marijuana     Allergies   Lisinopril; Aspirin; Ibuprofen; and Other   Review of Systems Review of Systems  Constitutional: Negative for chills and fever.  HENT: Negative for congestion and rhinorrhea.   Eyes: Negative for redness and visual disturbance.  Respiratory: Positive for shortness of breath. Negative for wheezing.   Cardiovascular: Positive for chest pain. Negative for palpitations.  Gastrointestinal: Negative for abdominal pain, nausea and vomiting.  Genitourinary: Negative for  dysuria and urgency.  Musculoskeletal: Negative for arthralgias and myalgias.  Skin: Negative for pallor and wound.  Neurological: Negative for dizziness and headaches.     Physical Exam Updated Vital Signs BP (!) 156/109   Pulse 66   Temp 99.7 F (37.6 C) (Oral)   Resp 13   Ht 5\' 2"  (1.575 m)   Wt 56.7 kg   SpO2 100%   BMI 22.86 kg/m   Physical Exam Vitals signs and nursing note reviewed.  Constitutional:  General: She is not in acute distress.    Appearance: She is well-developed. She is not diaphoretic.  HENT:     Head: Normocephalic and atraumatic.  Eyes:     Pupils: Pupils are equal, round, and reactive to light.  Neck:     Musculoskeletal: Normal range of motion and neck supple.  Cardiovascular:     Rate and Rhythm: Normal rate and regular rhythm.     Heart sounds: No murmur. No friction rub. No gallop.   Pulmonary:     Effort: Pulmonary effort is normal.     Breath sounds: No wheezing or rales.  Abdominal:     General: There is no distension.     Palpations: Abdomen is soft.     Tenderness: There is abdominal tenderness (diffuse abdominal tenderness).     Comments: exquisitely tender throughout the abdomen  Musculoskeletal:        General: No tenderness.  Skin:    General: Skin is warm and dry.  Neurological:     Mental Status: She is alert and oriented to person, place, and time.  Psychiatric:        Behavior: Behavior normal.      ED Treatments / Results  Labs (all labs ordered are listed, but only abnormal results are displayed) Labs Reviewed  BASIC METABOLIC PANEL - Abnormal; Notable for the following components:      Result Value   Potassium 3.3 (*)    CO2 20 (*)    All other components within normal limits  CBC - Abnormal; Notable for the following components:   RBC 3.56 (*)    Hemoglobin 11.1 (*)    HCT 33.3 (*)    All other components within normal limits  HEPATIC FUNCTION PANEL - Abnormal; Notable for the following components:    Indirect Bilirubin 0.1 (*)    All other components within normal limits  I-STAT TROPONIN, ED  I-STAT BETA HCG BLOOD, ED ([facility], [facility], [facility] ONLY)    EKG EKG Interpretation  Date/Time:  Wednesday September 10 2018 21:35:01 EST Ventricular Rate:  87 PR Interval:    QRS Duration: 86 QT Interval:  371 QTC Calculation: 447 R Axis:   72 Text Interpretation:  Sinus rhythm Since last tracing rate slower Otherwise no significant change Confirmed by Melene Plan [phone]) on 09/10/2018 9:51:59 PM   Radiology Dg Chest 2 View  Result Date: 09/10/2018 CLINICAL DATA:  Acute onset of left-sided chest pain. Shortness of breath, nausea and vomiting. EXAM: CHEST - 2 VIEW COMPARISON:  Chest radiograph performed 11/20/2017 FINDINGS: The lungs are well-aerated and clear. There is no evidence of focal opacification, pleural effusion or pneumothorax. The heart is normal in size; the mediastinal contour is within normal limits. No acute osseous abnormalities are seen. IMPRESSION: No acute cardiopulmonary process seen. Electronically Signed   By: Roanna Raider M.D.   On: 09/10/2018 22:25   Ct Abdomen Pelvis W Contrast  Result Date: 09/10/2018 CLINICAL DATA:  Chest pain and nausea EXAM: CT ABDOMEN AND PELVIS WITH CONTRAST TECHNIQUE: Multidetector CT imaging of the abdomen and pelvis was performed using the standard protocol following bolus administration of intravenous contrast. CONTRAST:  OMNIPAQUE IOHEXOL 300 MG/ML  SOLN COMPARISON:  05/05/2018 FINDINGS: LOWER CHEST: There is no basilar pleural or apical pericardial effusion. HEPATOBILIARY: The hepatic contours and density are normal. There is no intra- or extrahepatic biliary dilatation. The gallbladder is normal. PANCREAS: The pancreatic parenchymal contours are normal and there is no ductal dilatation. There is no  peripancreatic fluid collection. SPLEEN: Normal. ADRENALS/URINARY TRACT: --Adrenal glands: Normal. --Right kidney/ureter: No hydronephrosis,  nephroureterolithiasis, perinephric stranding or solid renal mass. --Left kidney/ureter: No hydronephrosis, nephroureterolithiasis, perinephric stranding or solid renal mass. --Urinary bladder: Normal for degree of distention STOMACH/BOWEL: --Stomach/Duodenum: There is no hiatal hernia or other gastric abnormality. The duodenal course and caliber are normal. --Small bowel: No dilatation or inflammation. --Colon: No focal abnormality. --Appendix: Normal. VASCULAR/LYMPHATIC: Normal course and caliber of the major abdominal vessels. No abdominal or pelvic lymphadenopathy. REPRODUCTIVE: Normal uterus and ovaries. MUSCULOSKELETAL. No bony spinal canal stenosis or focal osseous abnormality. OTHER: None. IMPRESSION: No acute abdominal or pelvic abnormality. Electronically Signed   By: Deatra Robinson M.D.   On: 09/10/2018 23:46    Procedures Procedures (including critical care time)  Medications Ordered in ED Medications  alum & mag hydroxide-simeth (MAALOX/MYLANTA) 200-200-20 MG/5ML suspension 15 mL (has no administration in time range)  morphine 4 MG/ML injection 4 mg (4 mg Intravenous Given 09/10/18 2226)  ondansetron (ZOFRAN) injection 4 mg (4 mg Intravenous Given 09/10/18 2226)  iohexol (OMNIPAQUE) 300 MG/ML solution 100 mL (100 mLs Intravenous Contrast Given 09/10/18 2323)     Initial Impression / Assessment and Plan / ED Course  I have reviewed the triage vital signs and the nursing notes.  Pertinent labs & imaging results that were available during my care of the patient were reviewed by me and considered in my medical decision making (see chart for details).     43 yo F with a chief complaints of chest pain.  However on my exam the patient has very exquisitely tender abdominal pain diffusely about the abdomen.  She tells me that this is from her stomach ulcer.  Later states she is not sure what it is from but has been having that pain off and on since Christmas.  I wonder if this is more  intra-abdominal pain than chest.  Will obtain abdominal labs, and CT.   CT scan with no acute abnormality as viewed by the radiologist.  My view of the images the patient has a large stool burden in the left upper quadrant where the patient is having pain.  We will have the patient do a trial of MiraLAX cleanout at home.  When I discussed this with her she has been having some trouble with constipation over the past few days.  12:00 AM:  I have discussed the diagnosis/risks/treatment options with the patient and family and believe the pt to be eligible for discharge home to follow-up with PCP, GI. We also discussed returning to the ED immediately if new or worsening sx occur. We discussed the sx which are most concerning (e.g., sudden worsening pain, fever, inability to tolerate by mouth) that necessitate immediate return. Medications administered to the patient during their visit and any new prescriptions provided to the patient are listed below.  Medications given during this visit Medications  alum & mag hydroxide-simeth (MAALOX/MYLANTA) 200-200-20 MG/5ML suspension 15 mL (has no administration in time range)  morphine 4 MG/ML injection 4 mg (4 mg Intravenous Given 09/10/18 2226)  ondansetron (ZOFRAN) injection 4 mg (4 mg Intravenous Given 09/10/18 2226)  iohexol (OMNIPAQUE) 300 MG/ML solution 100 mL (100 mLs Intravenous Contrast Given 09/10/18 2323)     The patient appears reasonably screen and/or stabilized for discharge and I doubt any other medical condition or other [facility] requiring further screening, evaluation, or treatment in the ED at this time prior to discharge.    Final Clinical Impressions(s) / ED Diagnoses  Final diagnoses:  LUQ abdominal pain    ED Discharge Orders    None       Melene PlanFloyd, Luverne Farone, DO 09/11/18 0000

## 2018-09-10 NOTE — ED Notes (Signed)
Patient transported to X-ray 

## 2018-09-10 NOTE — ED Triage Notes (Signed)
Pt coming by EMS. Complaining of chest pain left sided. Pain does not radiate to jaw or arm. Having shortness of breath and nausea. Pt stated she has had the chest pain for several weeks now but it escalated tonight.

## 2018-09-11 NOTE — ED Notes (Signed)
Patient verbalizes understanding of discharge instructions. Opportunity for questioning and answers were provided. Armband removed by staff, pt discharged from ED by wheelchair   

## 2018-09-12 DIAGNOSIS — F17200 Nicotine dependence, unspecified, uncomplicated: Secondary | ICD-10-CM | POA: Diagnosis not present

## 2018-09-12 DIAGNOSIS — Z09 Encounter for follow-up examination after completed treatment for conditions other than malignant neoplasm: Secondary | ICD-10-CM | POA: Diagnosis not present

## 2018-09-12 DIAGNOSIS — R0602 Shortness of breath: Secondary | ICD-10-CM | POA: Diagnosis not present

## 2018-09-12 DIAGNOSIS — D649 Anemia, unspecified: Secondary | ICD-10-CM | POA: Diagnosis not present

## 2019-06-18 ENCOUNTER — Other Ambulatory Visit: Payer: Self-pay

## 2019-06-18 ENCOUNTER — Encounter (HOSPITAL_COMMUNITY): Payer: Self-pay | Admitting: Emergency Medicine

## 2019-06-18 ENCOUNTER — Emergency Department (HOSPITAL_COMMUNITY)
Admission: EM | Admit: 2019-06-18 | Discharge: 2019-06-18 | Disposition: A | Discharge status: Home / Self Care | Payer: BC Managed Care – PPO | Attending: Emergency Medicine | Admitting: Emergency Medicine

## 2019-06-18 DIAGNOSIS — Z5321 Procedure and treatment not carried out due to patient leaving prior to being seen by health care provider: Secondary | ICD-10-CM | POA: Diagnosis not present

## 2019-06-18 DIAGNOSIS — R1084 Generalized abdominal pain: Secondary | ICD-10-CM | POA: Insufficient documentation

## 2019-06-18 DIAGNOSIS — R112 Nausea with vomiting, unspecified: Secondary | ICD-10-CM | POA: Diagnosis not present

## 2019-06-18 LAB — COMPREHENSIVE METABOLIC PANEL
ALT: 15 U/L (ref 0–44)
AST: 16 U/L (ref 15–41)
Albumin: 4.2 g/dL (ref 3.5–5.0)
Alkaline Phosphatase: 40 U/L (ref 38–126)
Anion gap: 12 (ref 5–15)
BUN: 9 mg/dL (ref 6–20)
CO2: 20 mmol/L — ABNORMAL LOW (ref 22–32)
Calcium: 9.3 mg/dL (ref 8.9–10.3)
Chloride: 106 mmol/L (ref 98–111)
Creatinine, Ser: 0.69 mg/dL (ref 0.44–1.00)
GFR calc Af Amer: >60 mL/min (ref >60)
GFR calc non Af Amer: >60 mL/min (ref >60)
Glucose, Bld: 93 mg/dL (ref 70–99)
Potassium: 3.2 mmol/L — ABNORMAL LOW (ref 3.5–5.1)
Sodium: 138 mmol/L (ref 135–145)
Total Bilirubin: 0.6 mg/dL (ref 0.3–1.2)
Total Protein: 7.3 g/dL (ref 6.5–8.1)

## 2019-06-18 LAB — CBC
HCT: 29.6 % — ABNORMAL LOW (ref 36.0–46.0)
Hemoglobin: 9.2 g/dL — ABNORMAL LOW (ref 12.0–15.0)
MCH: 28.8 pg (ref 26.0–34.0)
MCHC: 31.1 g/dL (ref 30.0–36.0)
MCV: 92.8 fL (ref 80.0–100.0)
Platelets: 402 10*3/uL — ABNORMAL HIGH (ref 150–400)
RBC: 3.19 MIL/uL — ABNORMAL LOW (ref 3.87–5.11)
RDW: 14.3 % (ref 11.5–15.5)
WBC: 4.1 10*3/uL (ref 4.0–10.5)
nRBC: 0 % (ref 0.0–0.2)

## 2019-06-18 LAB — LIPASE, BLOOD: Lipase: 28 U/L (ref 11–51)

## 2019-06-18 MED ORDER — SODIUM CHLORIDE 0.9% FLUSH: 3.0000 mL | Freq: Once | INTRAVENOUS | Status: DC

## 2019-06-18 NOTE — ED Notes (Signed)
Pt called x2 for vitals recheck, no answer. 

## 2019-06-18 NOTE — ED Triage Notes (Signed)
Pt c/o generalized abdominal pain with nausea/vomiting x 3 days. Denies diarrhea. Hx ulcers, HTN

## 2019-06-19 ENCOUNTER — Emergency Department (HOSPITAL_COMMUNITY): Payer: BC Managed Care – PPO

## 2019-06-19 ENCOUNTER — Encounter (HOSPITAL_COMMUNITY): Payer: Self-pay

## 2019-06-19 ENCOUNTER — Emergency Department (HOSPITAL_COMMUNITY)
Admission: EM | Admit: 2019-06-19 | Discharge: 2019-06-19 | Disposition: A | Discharge status: Home / Self Care | Payer: BC Managed Care – PPO | Attending: Emergency Medicine | Admitting: Emergency Medicine

## 2019-06-19 ENCOUNTER — Other Ambulatory Visit: Payer: Self-pay

## 2019-06-19 DIAGNOSIS — I10 Essential (primary) hypertension: Secondary | ICD-10-CM | POA: Insufficient documentation

## 2019-06-19 DIAGNOSIS — F1210 Cannabis abuse, uncomplicated: Secondary | ICD-10-CM | POA: Diagnosis not present

## 2019-06-19 DIAGNOSIS — R1011 Right upper quadrant pain: Secondary | ICD-10-CM | POA: Diagnosis not present

## 2019-06-19 DIAGNOSIS — K2970 Gastritis, unspecified, without bleeding: Secondary | ICD-10-CM | POA: Diagnosis not present

## 2019-06-19 DIAGNOSIS — Z79899 Other long term (current) drug therapy: Secondary | ICD-10-CM | POA: Diagnosis not present

## 2019-06-19 DIAGNOSIS — Z87891 Personal history of nicotine dependence: Secondary | ICD-10-CM | POA: Insufficient documentation

## 2019-06-19 DIAGNOSIS — R1013 Epigastric pain: Secondary | ICD-10-CM | POA: Insufficient documentation

## 2019-06-19 DIAGNOSIS — R109 Unspecified abdominal pain: Secondary | ICD-10-CM | POA: Diagnosis present

## 2019-06-19 LAB — COMPREHENSIVE METABOLIC PANEL
ALT: 15 U/L (ref 0–44)
AST: 18 U/L (ref 15–41)
Albumin: 4.3 g/dL (ref 3.5–5.0)
Alkaline Phosphatase: 40 U/L (ref 38–126)
Anion gap: 9 (ref 5–15)
BUN: 12 mg/dL (ref 6–20)
CO2: 23 mmol/L (ref 22–32)
Calcium: 9.5 mg/dL (ref 8.9–10.3)
Chloride: 108 mmol/L (ref 98–111)
Creatinine, Ser: 0.63 mg/dL (ref 0.44–1.00)
GFR calc Af Amer: >60 mL/min (ref >60)
GFR calc non Af Amer: >60 mL/min (ref >60)
Glucose, Bld: 91 mg/dL (ref 70–99)
Potassium: 3.4 mmol/L — ABNORMAL LOW (ref 3.5–5.1)
Sodium: 140 mmol/L (ref 135–145)
Total Bilirubin: 0.3 mg/dL (ref 0.3–1.2)
Total Protein: 7.5 g/dL (ref 6.5–8.1)

## 2019-06-19 LAB — CBC
HCT: 29.6 % — ABNORMAL LOW (ref 36.0–46.0)
Hemoglobin: 9.1 g/dL — ABNORMAL LOW (ref 12.0–15.0)
MCH: 28.5 pg (ref 26.0–34.0)
MCHC: 30.7 g/dL (ref 30.0–36.0)
MCV: 92.8 fL (ref 80.0–100.0)
Platelets: 404 10*3/uL — ABNORMAL HIGH (ref 150–400)
RBC: 3.19 MIL/uL — ABNORMAL LOW (ref 3.87–5.11)
RDW: 14.3 % (ref 11.5–15.5)
WBC: 4.2 10*3/uL (ref 4.0–10.5)
nRBC: 0 % (ref 0.0–0.2)

## 2019-06-19 LAB — URINALYSIS, ROUTINE W REFLEX MICROSCOPIC
Bacteria, UA: NONE SEEN
Bilirubin Urine: NEGATIVE
Glucose, UA: NEGATIVE mg/dL
Hgb urine dipstick: NEGATIVE
Ketones, ur: 5 mg/dL — AB
Leukocytes,Ua: NEGATIVE
Nitrite: NEGATIVE
Protein, ur: 30 mg/dL — AB
Specific Gravity, Urine: 1.029 (ref 1.005–1.030)
pH: 6 (ref 5.0–8.0)

## 2019-06-19 LAB — LIPASE, BLOOD: Lipase: 37 U/L (ref 11–51)

## 2019-06-19 LAB — PREGNANCY, URINE: Preg Test, Ur: NEGATIVE

## 2019-06-19 IMAGING — US US ABDOMEN LIMITED
1 series · 14 of 25 positions shown · non-contrast
Comparison: CT scan of the abdomen dated [DATE]

CLINICAL DATA: Epigastric pain.

EXAM:
ULTRASOUND ABDOMEN LIMITED RIGHT UPPER QUADRANT

[Series 1: us abdomen limited · 14 of 54 slices shown]
[im 1/54]
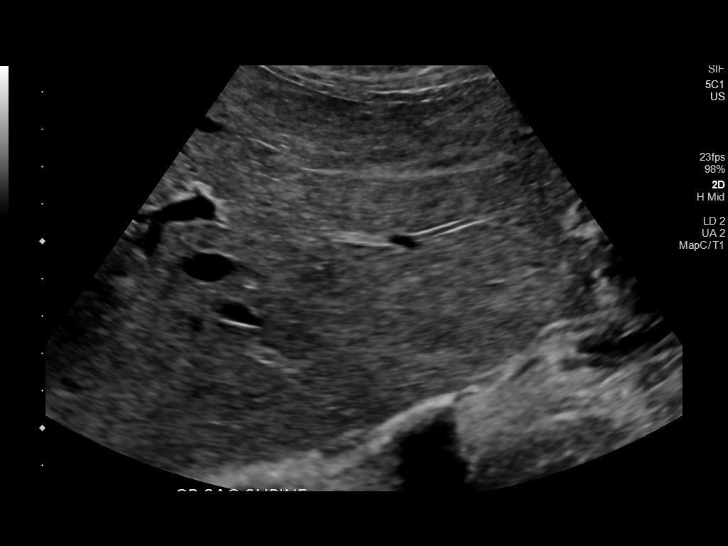
[im 5/54]
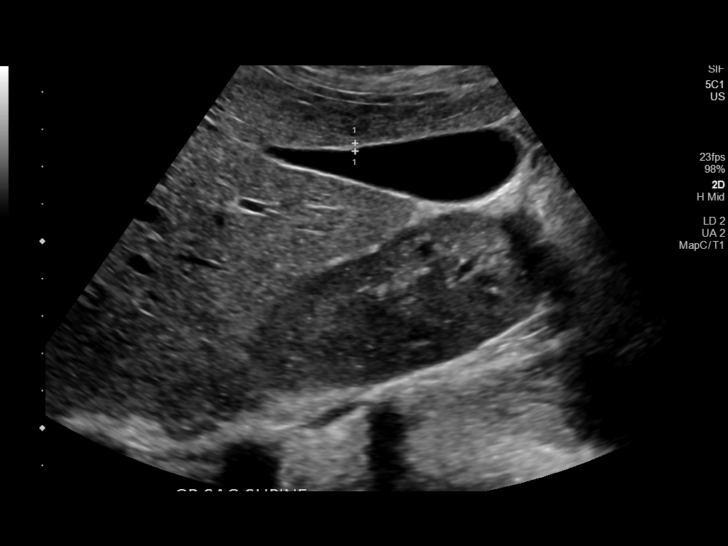
[im 9/54]
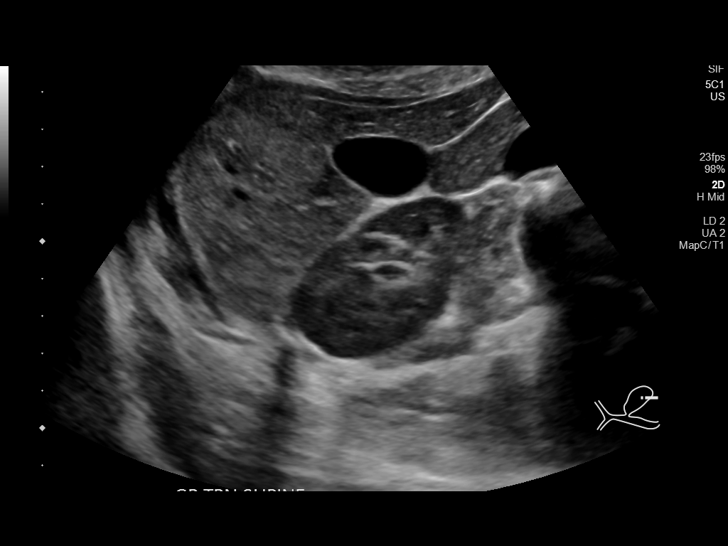
[im 14/54]
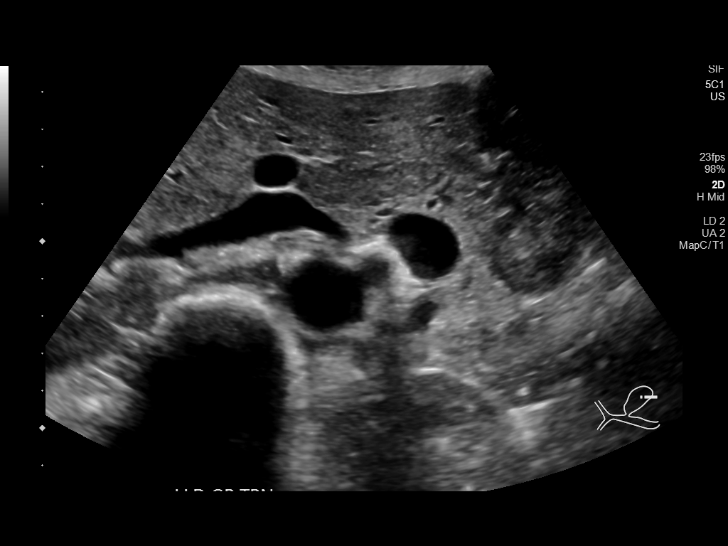
[im 18/54]
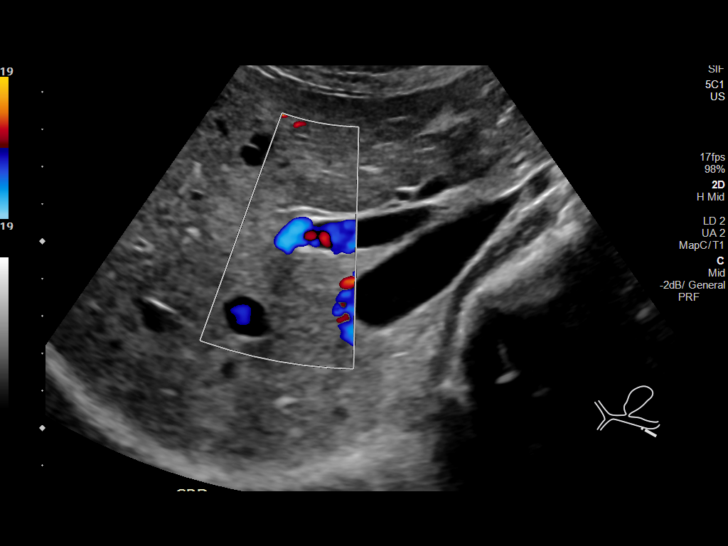
[im 20/54]
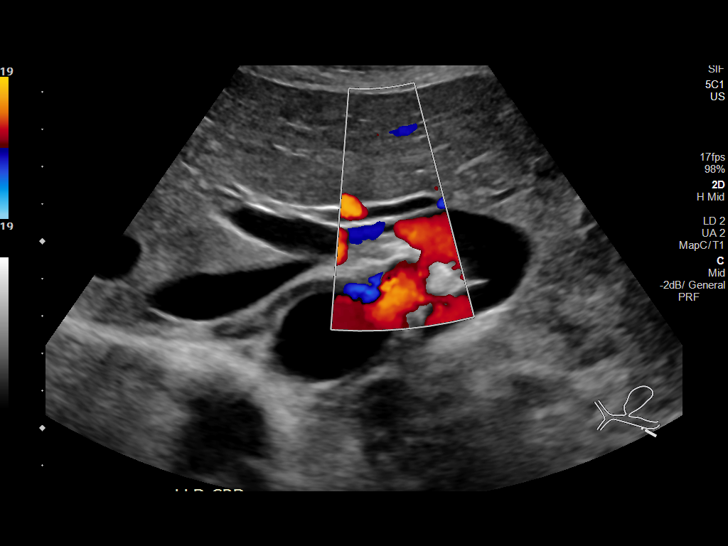
[im 25/54]
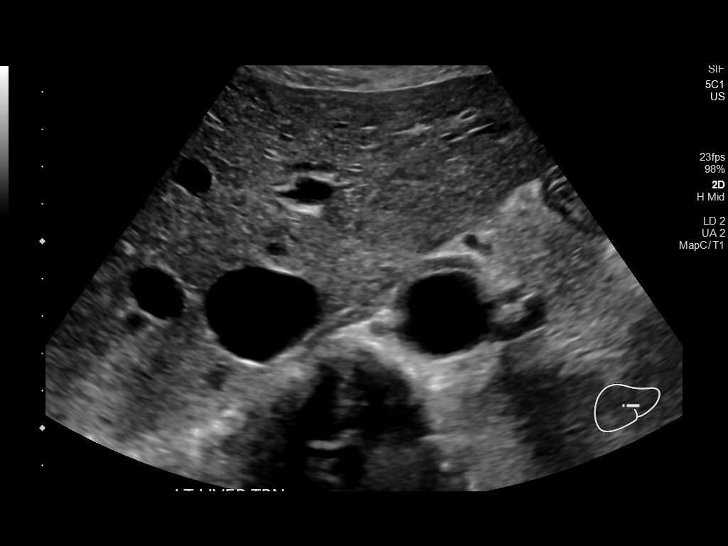
[im 29/54]
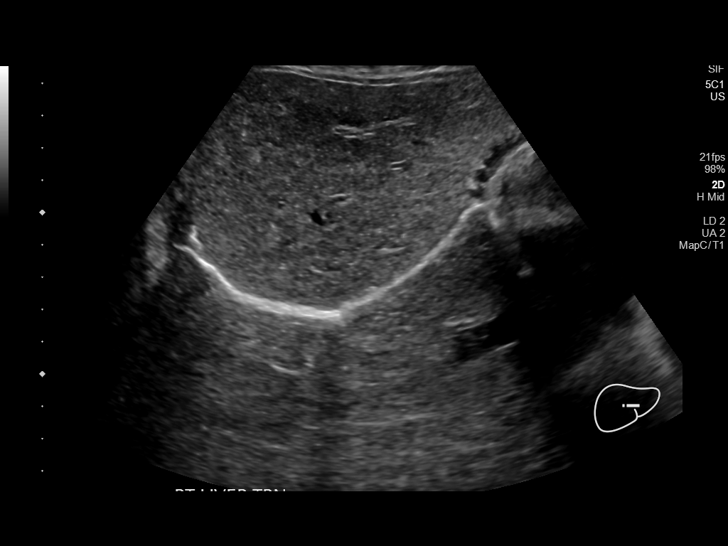
[im 34/54]
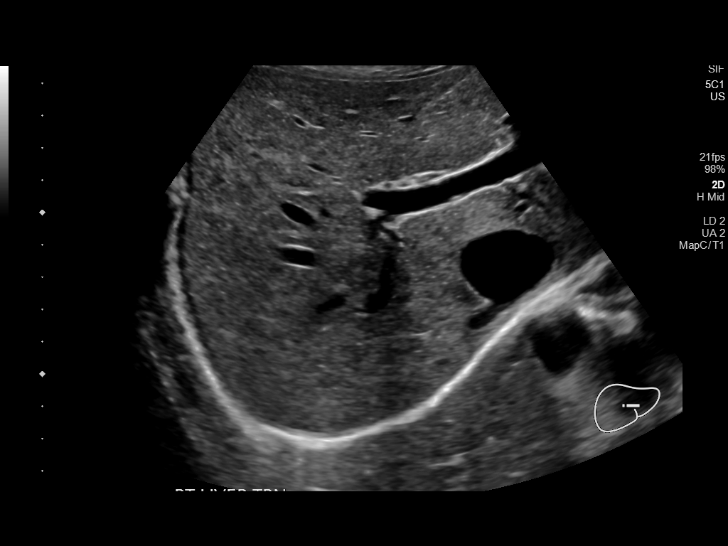
[im 36/54]
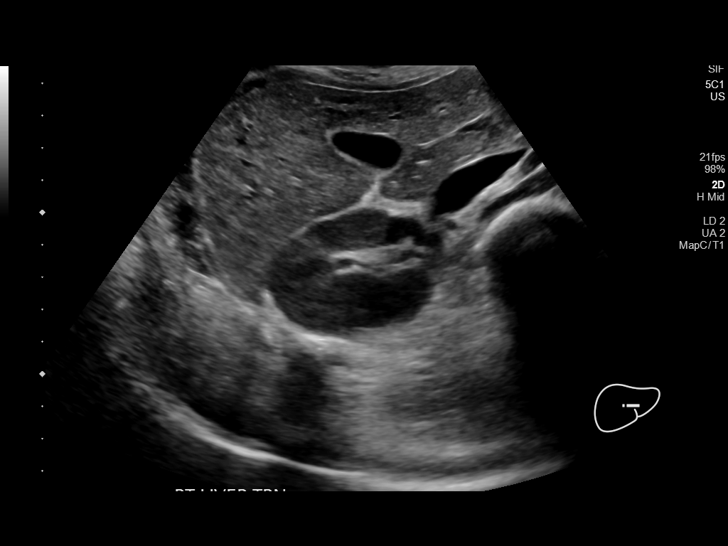
[im 40/54]
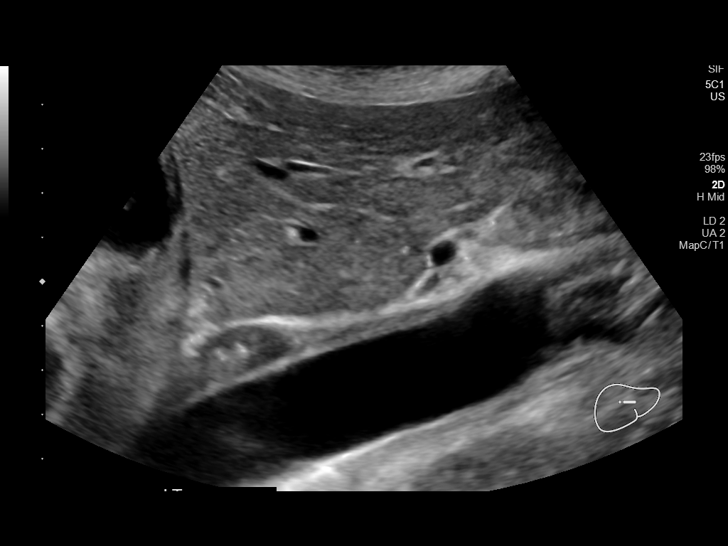
[im 45/54]
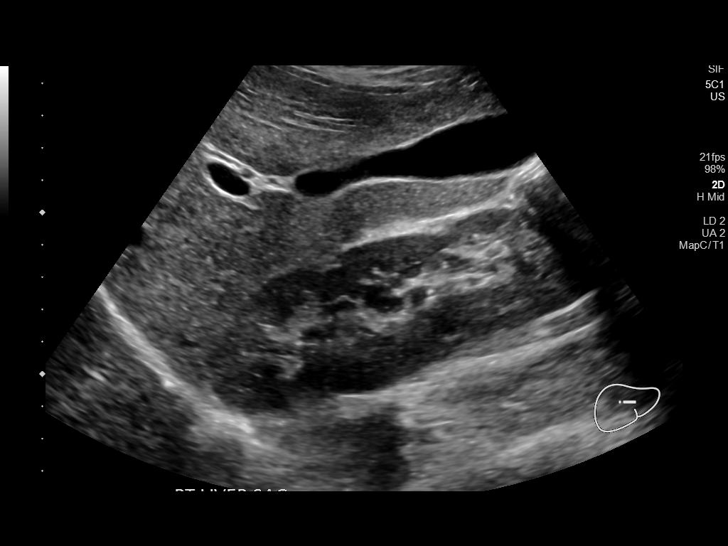
[im 49/54]
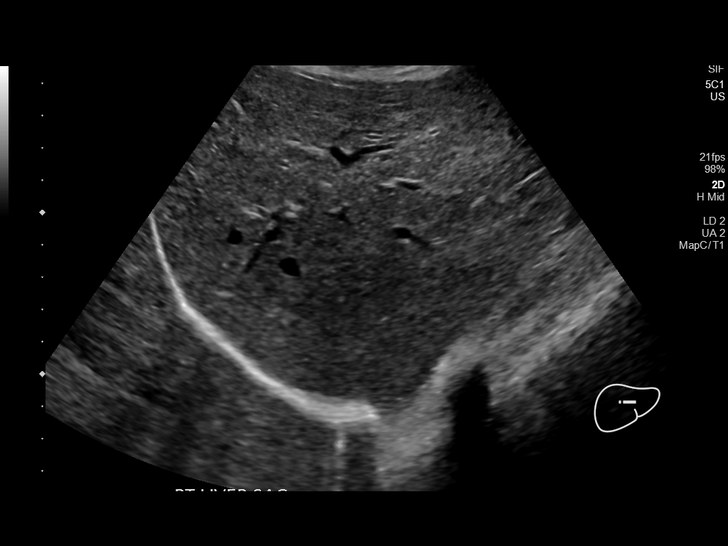
[im 54/54]
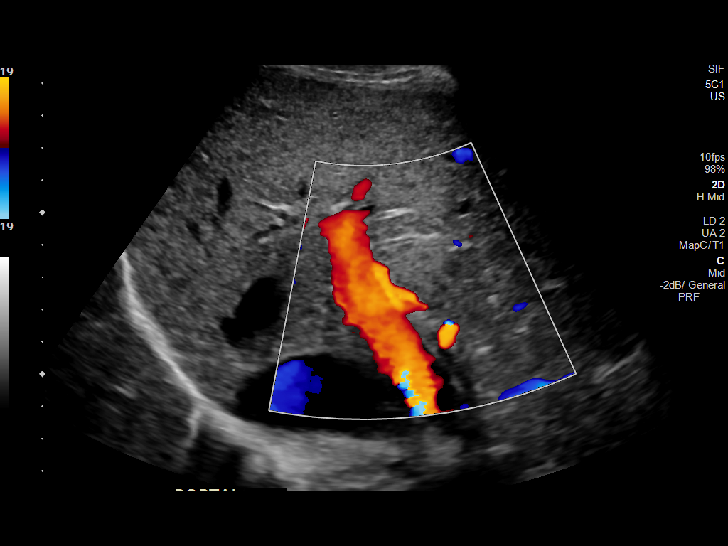

[14 of 25 positions shown; findings below may reference images not displayed]

FINDINGS: Gallbladder:

No gallstones or wall thickening visualized. No sonographic Murphy
sign noted by sonographer.

Common bile duct:

Diameter: 3 mm, normal.

Liver:

No focal lesion identified. Within normal limits in parenchymal
echogenicity. Portal vein is patent on color Doppler imaging with
normal direction of blood flow towards the liver.

Other: None.
IMPRESSION: Normal exam.

## 2019-06-19 MED ORDER — SODIUM CHLORIDE 0.9% FLUSH: 3.0000 mL | Freq: Once | INTRAVENOUS | Status: DC

## 2019-06-19 MED ORDER — SUCRALFATE 1 G PO TABS: 1.0000 g | ORAL_TABLET | Freq: Three times a day (TID) | ORAL | 1 refills | Status: DC

## 2019-06-19 MED ORDER — PANTOPRAZOLE SODIUM 20 MG PO TBEC: 20.0000 mg | DELAYED_RELEASE_TABLET | Freq: Two times a day (BID) | ORAL | 1 refills | Status: DC

## 2019-06-19 MED ORDER — ALUM & MAG HYDROXIDE-SIMETH 200-200-20 MG/5ML PO SUSP
30.0000 mL | Freq: Once | ORAL | Status: AC
  Administered 2019-06-19: 30 mL via ORAL
  Filled 2019-06-19: qty 30

## 2019-06-19 MED ORDER — LIDOCAINE VISCOUS HCL 2 % MT SOLN
15.0000 mL | Freq: Once | OROMUCOSAL | Status: AC
  Administered 2019-06-19: 15 mL via ORAL
  Filled 2019-06-19: qty 15

## 2019-06-19 NOTE — Discharge Instructions (Addendum)
Begin taking Protonix and Carafate as prescribed.  Stop taking Prilosec.  Follow-up with your primary doctor if symptoms or not improving in the next week, and return to the ER if you develop worsening pain, high fever, bloody stool or vomit, or other new and concerning symptoms.

## 2019-06-19 NOTE — ED Notes (Signed)
US at bedside

## 2019-06-19 NOTE — ED Provider Notes (Signed)
[facility]-EMERGENCY DEPT Provider Note   CSN: [id number] Arrival date & time: 06/19/19  1011     History   Chief Complaint Chief Complaint  Patient presents with  . Abdominal Pain  . Emesis    HPI Shirley CHAVERO is a 43 y.o. female.     Patient is a 43 year old female with past medical history of GERD, stomach ulcer, hypertension, prior C-section x4, and hernia repair.  She presents today for evaluation of abdominal pain.  She describes a several day history of a "burning"to her epigastric region every time she tries to eat or drink.  She does report some vomiting but denies any diarrhea or constipation.  Vomiting has been nonbloody.  She denies fevers or chills.  The history is provided by the patient.  Abdominal Pain Pain location:  Epigastric Pain quality: burning   Pain radiates to:  Does not radiate Pain severity:  Moderate Onset quality:  Gradual Duration:  4 days Timing:  Constant Progression:  Worsening Chronicity:  New Context: eating   Relieved by:  Nothing   Past Medical History:  Diagnosis Date  . Anemia   . Bacteremia   . Blood dyscrasia    has to apply a lot of pressure to stop bleeding  . GERD (gastroesophageal reflux disease)   . Heart murmur    with pregnancy  . History of hiatal hernia   . Hypertension    4-5 years ago, no meds  . Kidney infection    patient states I might have a kidney infection  . Migraine    migraines  . Pyelonephritis     Patient Active Problem List   Diagnosis Date Noted  . Pyelonephritis 08/03/2017  . Bacteremia 08/03/2017  . Hypokalemia 08/03/2017  . Tobacco use 08/03/2017  . Acute postoperative pain 07/31/2016  . Menometrorrhagia 04/09/2016  . Anemia 04/09/2016  . Abnormal uterine bleeding (AUB) 04/09/2016  . Essential hypertension 11/17/2009    Past Surgical History:  Procedure Laterality Date  . ABDOMINAL HERNIA REPAIR    . CESAREAN SECTION     x 4  . CESAREAN SECTION     x 4   . ENDOMETRIAL ABLATION  07/31/2016  . HYSTEROSCOPY  07/31/2016   Procedure: HYSTEROSCOPY WITH HYDROTHERMAL ABLATION;  Surgeon: [place] Bing, MD;  Location: [facility];  Service: Gynecology;;     OB History    Gravida  5   Para  4   Term  3   Preterm  1   AB  1   Living  4     SAB      TAB      Ectopic      Multiple      Live Births           Obstetric Comments  c--section x 4. 36wk c-section x 1         Home Medications    Prior to Admission medications   Medication Sig Start Date End Date Taking? Authorizing Provider  acetaminophen (TYLENOL) 500 MG tablet Take 1,000 mg by mouth every 6 (six) hours as needed for mild pain or headache.    [provider]  albuterol (PROVENTIL HFA;VENTOLIN HFA) 108 (90 Base) MCG/ACT inhaler Inhale 2 puffs into the lungs every 6 (six) hours as needed for wheezing or shortness of breath.    [provider]  amLODipine (NORVASC) 2.5 MG tablet Take 2.5 mg by mouth daily. 01/08/18   [provider]  linaclotide Karlene Einstein) 72  MCG capsule Take 1 capsule (72 mcg total) by mouth daily before breakfast. 03/14/18   Danis, Andreas BlowerHenry L III, MD  methocarbamol (ROBAXIN) 500 MG tablet Take 1 tablet (500 mg total) by mouth 2 (two) times daily. Patient not taking: Reported on 09/10/2018 06/17/18   Hedges, Tinnie GensJeffrey, PA-C  metoCLOPramide (REGLAN) 5 MG tablet Take 1 tablet (5 mg total) by mouth every 6 (six) hours as needed for nausea. 06/03/18   Sherrilyn Ristanis, Henry L III, MD  Multiple Vitamins-Minerals (WOMENS DAILY FORMULA PO) Take 1 tablet by mouth daily.    [provider]  omeprazole (PRILOSEC) 20 MG capsule Take 20 mg by mouth 2 (two) times daily before a meal.    [provider]  ondansetron (ZOFRAN ODT) 4 MG disintegrating tablet Take 1 tablet (4 mg total) by mouth every 8 (eight) hours as needed for nausea or vomiting. 05/05/18   Pricilla LovelessGoldston, Scott, MD  polyethylene glycol (MIRALAX / GLYCOLAX) packet Take 17 g by mouth daily  as needed for mild constipation.     [provider]    Family History Family History  Problem Relation Age of Onset  . Hypertension Mother   . Colon cancer Father [phone]  . Stroke Brother     Social History Social History   Tobacco Use  . Smoking status: Former Smoker    Packs/day: 0.50    Types: Cigarettes  . Smokeless tobacco: Never Used  Substance Use Topics  . Alcohol use: Yes    Comment: occasionally  . Drug use: Yes    Types: Marijuana     Allergies   Lisinopril, Aspirin, Ibuprofen, and Other   Review of Systems Review of Systems  All other systems reviewed and are negative.    Physical Exam Updated Vital Signs BP (!) 150/103 (BP Location: Left Arm)   Pulse 84   Temp 99.2 F (37.3 C) (Oral)   Resp 18   Ht 5\' 2"  (1.575 m)   Wt 55.8 kg   SpO2 100%   BMI 22.50 kg/m   Physical Exam Vitals signs and nursing note reviewed.  Constitutional:      General: She is not in acute distress.    Appearance: She is well-developed. She is not diaphoretic.  HENT:     Head: Normocephalic and atraumatic.  Neck:     Musculoskeletal: Normal range of motion and neck supple.  Cardiovascular:     Rate and Rhythm: Normal rate and regular rhythm.     Heart sounds: No murmur. No friction rub. No gallop.   Pulmonary:     Effort: Pulmonary effort is normal. No respiratory distress.     Breath sounds: Normal breath sounds. No wheezing.  Abdominal:     General: Bowel sounds are normal. There is no distension.     Palpations: Abdomen is soft.     Tenderness: There is abdominal tenderness in the epigastric area. There is no right CVA tenderness, left CVA tenderness, guarding or rebound.  Musculoskeletal: Normal range of motion.  Skin:    General: Skin is warm and dry.  Neurological:     Mental Status: She is alert and oriented to person, place, and time.      ED Treatments / Results  Labs (all labs ordered are listed, but only abnormal results are displayed)  Labs Reviewed  CBC - Abnormal; Notable for the following components:      Result Value   RBC 3.19 (*)    Hemoglobin 9.1 (*)    HCT 29.6 (*)  Platelets 404 (*)    All other components within normal limits  LIPASE, BLOOD  COMPREHENSIVE METABOLIC PANEL  URINALYSIS, ROUTINE W REFLEX MICROSCOPIC    EKG None  Radiology No results found.  Procedures Procedures (including critical care time)  Medications Ordered in ED Medications  alum & mag hydroxide-simeth (MAALOX/MYLANTA) 200-200-20 MG/5ML suspension 30 mL (has no administration in time range)    And  lidocaine (XYLOCAINE) 2 % viscous mouth solution 15 mL (has no administration in time range)     Initial Impression / Assessment and Plan / ED Course  I have reviewed the triage vital signs and the nursing notes.  Pertinent labs & imaging results that were available during my care of the patient were reviewed by me and considered in my medical decision making (see chart for details).  Patient presenting here with epigastric discomfort for the past several days.  This is worse when she eats or drinks.  Patient has history of stomach ulcer and I suspect this is a recurrence.  She had minimal relief with GI cocktail, but ultrasound and laboratory studies are all essentially normal.  At this point, I feel as though patient is appropriate for discharge with a change from her Prilosec to Protonix.  She will also be prescribed Carafate.  She is to follow-up with her primary doctor if not improving.  Final Clinical Impressions(s) / ED Diagnoses   Final diagnoses:  Epigastric pain    ED Discharge Orders    None       Veryl Speak, MD 06/19/19 1354

## 2019-06-19 NOTE — ED Triage Notes (Signed)
Patient c/o mid abdominal pain and emesis x 4 days.  Patient was at [facility] ED yesterday, but left due to wait times.

## 2019-07-07 ENCOUNTER — Ambulatory Visit (INDEPENDENT_AMBULATORY_CARE_PROVIDER_SITE_OTHER)
Admission: EM | Admit: 2019-07-07 | Discharge: 2019-07-07 | Disposition: A | Discharge status: Home / Self Care | Payer: BC Managed Care – PPO | Source: Home / Self Care

## 2019-07-07 ENCOUNTER — Emergency Department (HOSPITAL_COMMUNITY)
Admission: EM | Admit: 2019-07-07 | Discharge: 2019-07-08 | Disposition: A | Discharge status: Home / Self Care | Payer: BC Managed Care – PPO | Attending: Emergency Medicine | Admitting: Emergency Medicine

## 2019-07-07 ENCOUNTER — Encounter (HOSPITAL_COMMUNITY): Payer: Self-pay

## 2019-07-07 ENCOUNTER — Other Ambulatory Visit: Payer: Self-pay

## 2019-07-07 DIAGNOSIS — R1110 Vomiting, unspecified: Secondary | ICD-10-CM | POA: Diagnosis present

## 2019-07-07 DIAGNOSIS — Z5321 Procedure and treatment not carried out due to patient leaving prior to being seen by health care provider: Secondary | ICD-10-CM | POA: Diagnosis not present

## 2019-07-07 DIAGNOSIS — R1013 Epigastric pain: Secondary | ICD-10-CM | POA: Diagnosis not present

## 2019-07-07 DIAGNOSIS — I10 Essential (primary) hypertension: Secondary | ICD-10-CM

## 2019-07-07 LAB — CBC
HCT: 35 % — ABNORMAL LOW (ref 36.0–46.0)
Hemoglobin: 11 g/dL — ABNORMAL LOW (ref 12.0–15.0)
MCH: 28.1 pg (ref 26.0–34.0)
MCHC: 31.4 g/dL (ref 30.0–36.0)
MCV: 89.5 fL (ref 80.0–100.0)
Platelets: 427 10*3/uL — ABNORMAL HIGH (ref 150–400)
RBC: 3.91 MIL/uL (ref 3.87–5.11)
RDW: 14.5 % (ref 11.5–15.5)
WBC: 5 10*3/uL (ref 4.0–10.5)
nRBC: 0 % (ref 0.0–0.2)

## 2019-07-07 LAB — COMPREHENSIVE METABOLIC PANEL
ALT: 16 U/L (ref 0–44)
AST: 19 U/L (ref 15–41)
Albumin: 5 g/dL (ref 3.5–5.0)
Alkaline Phosphatase: 45 U/L (ref 38–126)
Anion gap: 12 (ref 5–15)
BUN: 12 mg/dL (ref 6–20)
CO2: 21 mmol/L — ABNORMAL LOW (ref 22–32)
Calcium: 10.4 mg/dL — ABNORMAL HIGH (ref 8.9–10.3)
Chloride: 104 mmol/L (ref 98–111)
Creatinine, Ser: 0.65 mg/dL (ref 0.44–1.00)
GFR calc Af Amer: >60 mL/min (ref >60)
GFR calc non Af Amer: >60 mL/min (ref >60)
Glucose, Bld: 109 mg/dL — ABNORMAL HIGH (ref 70–99)
Potassium: 2.9 mmol/L — ABNORMAL LOW (ref 3.5–5.1)
Sodium: 137 mmol/L (ref 135–145)
Total Bilirubin: 0.4 mg/dL (ref 0.3–1.2)
Total Protein: 8.6 g/dL — ABNORMAL HIGH (ref 6.5–8.1)

## 2019-07-07 LAB — I-STAT BETA HCG BLOOD, ED (MC, WL, AP ONLY): I-stat hCG, quantitative: <5 m[IU]/mL (ref <5)

## 2019-07-07 LAB — LIPASE, BLOOD: Lipase: 25 U/L (ref 11–51)

## 2019-07-07 MED ORDER — ONDANSETRON 4 MG PO TBDP
4.0000 mg | ORAL_TABLET | Freq: Once | ORAL | Status: AC | PRN
  Administered 2019-07-07: 4 mg via ORAL
  Filled 2019-07-07: qty 1

## 2019-07-07 MED ORDER — SODIUM CHLORIDE 0.9% FLUSH: 3.0000 mL | Freq: Once | INTRAVENOUS | Status: DC

## 2019-07-07 NOTE — Discharge Instructions (Addendum)
Go to the emergency department for evaluation of your abdominal pain and vomiting.

## 2019-07-07 NOTE — ED Provider Notes (Signed)
[facility]    CSN: [id number] Arrival date & time: 07/07/19  1538      History   Chief Complaint Chief Complaint  Patient presents with  . Emesis    HPI Shirley [name] is a 43 y.o. female.   Patient reports acute abdominal pain and vomiting since yesterday evening.  She reports the pain as 9/10 in her epigastric region.  She reports 9 episodes of vomiting today.  She was seen in the emergency department at [facility] for similar symptoms on 06/19/2019 and treated with Protonix and Carafate;  Patient reports these medications helped.  The history is provided by the patient.    Past Medical History:  Diagnosis Date  . Anemia   . Bacteremia   . Blood dyscrasia    has to apply a lot of pressure to stop bleeding  . GERD (gastroesophageal reflux disease)   . Heart murmur    with pregnancy  . History of hiatal hernia   . Hypertension    4-5 years ago, no meds  . Kidney infection    patient states I might have a kidney infection  . Migraine    migraines  . Pyelonephritis     Patient Active Problem List   Diagnosis Date Noted  . Pyelonephritis 08/03/2017  . Bacteremia 08/03/2017  . Hypokalemia 08/03/2017  . Tobacco use 08/03/2017  . Acute postoperative pain 07/31/2016  . Menometrorrhagia 04/09/2016  . Anemia 04/09/2016  . Abnormal uterine bleeding (AUB) 04/09/2016  . Essential hypertension 11/17/2009    Past Surgical History:  Procedure Laterality Date  . ABDOMINAL HERNIA REPAIR    . CESAREAN SECTION     x 4  . CESAREAN SECTION     x 4  . ENDOMETRIAL ABLATION  07/31/2016  . HYSTEROSCOPY  07/31/2016   Procedure: HYSTEROSCOPY WITH HYDROTHERMAL ABLATION;  Surgeon: Aletha Halim, MD;  Location: [facility];  Service: Gynecology;;    OB History    Gravida  5   Para  4   Term  3   Preterm  1   AB  1   Living  4     SAB      TAB      Ectopic      Multiple      Live Births           Obstetric Comments  c--section x 4. 36wk  c-section x 1         Home Medications    Prior to Admission medications   Medication Sig Start Date End Date Taking? Authorizing Provider  acetaminophen (TYLENOL) 500 MG tablet Take 1,000 mg by mouth every 6 (six) hours as needed for mild pain or headache.    [provider]  albuterol (PROVENTIL HFA;VENTOLIN HFA) 108 (90 Base) MCG/ACT inhaler Inhale 2 puffs into the lungs every 6 (six) hours as needed for wheezing or shortness of breath.    [provider]  amLODipine (NORVASC) 2.5 MG tablet Take 2.5 mg by mouth daily. 01/08/18   [provider]  linaclotide Rolan Lipa) 72 MCG capsule Take 1 capsule (72 mcg total) by mouth daily before breakfast. 03/14/18   Danis, Kirke Corin, MD  methocarbamol (ROBAXIN) 500 MG tablet Take 1 tablet (500 mg total) by mouth 2 (two) times daily. Patient not taking: Reported on 09/10/2018 06/17/18   Hedges, Dellis Filbert, PA-C  metoCLOPramide (REGLAN) 5 MG tablet Take 1 tablet (5 mg total) by mouth every 6 (six) hours as needed  for nausea. 06/03/18   Sherrilyn Rist, MD  Multiple Vitamins-Minerals (WOMENS DAILY FORMULA PO) Take 1 tablet by mouth daily.    [provider]  omeprazole (PRILOSEC) 20 MG capsule Take 20 mg by mouth 2 (two) times daily before a meal.    [provider]  ondansetron (ZOFRAN ODT) 4 MG disintegrating tablet Take 1 tablet (4 mg total) by mouth every 8 (eight) hours as needed for nausea or vomiting. 05/05/18   Pricilla Loveless, MD  pantoprazole (PROTONIX) 20 MG tablet Take 1 tablet (20 mg total) by mouth 2 (two) times daily before a meal. 06/19/19   Geoffery Lyons, MD  polyethylene glycol (MIRALAX / GLYCOLAX) packet Take 17 g by mouth daily as needed for mild constipation.     [provider]  sucralfate (CARAFATE) 1 g tablet Take 1 tablet (1 g total) by mouth 4 (four) times daily -  with meals and at bedtime. 06/19/19   Geoffery Lyons, MD    Family History Family History  Problem Relation Age of  Onset  . Hypertension Mother   . Colon cancer Father 55  . Stroke Brother     Social History Social History   Tobacco Use  . Smoking status: Former Smoker    Packs/day: 0.50    Types: Cigarettes  . Smokeless tobacco: Never Used  Substance Use Topics  . Alcohol use: Yes    Comment: occasionally  . Drug use: Yes    Types: Marijuana     Allergies   Lisinopril, Aspirin, Ibuprofen, and Other   Review of Systems Review of Systems  Constitutional: Negative for chills and fever.  HENT: Negative for ear pain and sore throat.   Eyes: Negative for pain and visual disturbance.  Respiratory: Negative for cough and shortness of breath.   Cardiovascular: Negative for chest pain and palpitations.  Gastrointestinal: Positive for abdominal pain, nausea and vomiting. Negative for constipation and diarrhea.  Genitourinary: Negative for dysuria and hematuria.  Musculoskeletal: Negative for arthralgias and back pain.  Skin: Negative for color change and rash.  Neurological: Negative for seizures and syncope.  All other systems reviewed and are negative.    Physical Exam Triage Vital Signs ED Triage Vitals  Enc Vitals Group     BP 07/07/19 1649 (!) 147/107     Pulse Rate 07/07/19 1649 89     Resp 07/07/19 1649 18     Temp 07/07/19 1649 98.2 F (36.8 C)     Temp Source 07/07/19 1649 Oral     SpO2 07/07/19 1649 100 %     Weight 07/07/19 1648 119 lb (54 kg)     Height --      Head Circumference --      Peak Flow --      Pain Score 07/07/19 1648 9     Pain Loc --      Pain Edu? --      Excl. in GC? --    No data found.  Updated Vital Signs BP (!) 147/107 (BP Location: Right Arm)   Pulse 89   Temp 98.2 F (36.8 C) (Oral)   Resp 18   Wt 119 lb (54 kg)   SpO2 100%   BMI 21.77 kg/m   Visual Acuity Right Eye Distance:   Left Eye Distance:   Bilateral Distance:    Right Eye Near:   Left Eye Near:    Bilateral Near:     Physical Exam Vitals signs and nursing note  reviewed.  Constitutional:      General: She is not in acute distress.    Appearance: She is well-developed.  HENT:     Head: Normocephalic and atraumatic.     Mouth/Throat:     Mouth: Mucous membranes are moist.     Pharynx: Oropharynx is clear.  Eyes:     Conjunctiva/sclera: Conjunctivae normal.  Neck:     Musculoskeletal: Neck supple.  Cardiovascular:     Rate and Rhythm: Normal rate and regular rhythm.     Heart sounds: No murmur.  Pulmonary:     Effort: Pulmonary effort is normal. No respiratory distress.     Breath sounds: Normal breath sounds.  Abdominal:     Palpations: Abdomen is soft.     Tenderness: There is abdominal tenderness. There is guarding. There is no rebound.     Comments: Acute tenderness to palpation of epigastric region.    Skin:    General: Skin is warm and dry.  Neurological:     General: No focal deficit present.     Mental Status: She is alert and oriented to person, place, and time.      UC Treatments / Results  Labs (all labs ordered are listed, but only abnormal results are displayed) Labs Reviewed - No data to display  EKG   Radiology No results found.  Procedures Procedures (including critical care time)  Medications Ordered in UC Medications - No data to display  Initial Impression / Assessment and Plan / UC Course  I have reviewed the triage vital signs and the nursing notes.  Pertinent labs & imaging results that were available during my care of the patient were reviewed by me and considered in my medical decision making (see chart for details).    Epigastric pain, acute.  Elevated blood pressure with known hypertension.  Sending patient to the emergency department for evaluation due to her acute tenderness to palpation of her abdomen and numerous episodes of vomiting today.     Final Clinical Impressions(s) / UC Diagnoses   Final diagnoses:  Elevated blood pressure reading in office with diagnosis of hypertension   Epigastric pain     Discharge Instructions     Go to the emergency department for evaluation of your abdominal pain and vomiting.       ED Prescriptions    None     PDMP not reviewed this encounter.   Mickie Bailate, Shammara Jarrett H, NP 07/07/19 [phone]

## 2019-07-07 NOTE — ED Triage Notes (Signed)
Pt states she has been vomiting and stomach pain. This started last night.

## 2019-07-07 NOTE — ED Triage Notes (Signed)
Pt being sent from UC with c/o epigastric pain and vomiting since 2100 last pm and is unable to keep anything down. Hx of stomach ulcer and GERD. Zofran give at triage.

## 2019-07-08 ENCOUNTER — Other Ambulatory Visit: Payer: Self-pay

## 2019-07-08 ENCOUNTER — Emergency Department: Payer: BC Managed Care – PPO

## 2019-07-08 ENCOUNTER — Emergency Department
Admission: EM | Admit: 2019-07-08 | Discharge: 2019-07-09 | Disposition: A | Discharge status: Home / Self Care | Payer: BC Managed Care – PPO | Attending: Emergency Medicine | Admitting: Emergency Medicine

## 2019-07-08 DIAGNOSIS — R1084 Generalized abdominal pain: Secondary | ICD-10-CM | POA: Diagnosis not present

## 2019-07-08 DIAGNOSIS — R1010 Upper abdominal pain, unspecified: Secondary | ICD-10-CM | POA: Diagnosis not present

## 2019-07-08 DIAGNOSIS — R109 Unspecified abdominal pain: Secondary | ICD-10-CM | POA: Diagnosis not present

## 2019-07-08 DIAGNOSIS — N854 Malposition of uterus: Secondary | ICD-10-CM | POA: Diagnosis not present

## 2019-07-08 DIAGNOSIS — Z87891 Personal history of nicotine dependence: Secondary | ICD-10-CM | POA: Insufficient documentation

## 2019-07-08 DIAGNOSIS — R0789 Other chest pain: Secondary | ICD-10-CM | POA: Diagnosis not present

## 2019-07-08 DIAGNOSIS — Z79899 Other long term (current) drug therapy: Secondary | ICD-10-CM | POA: Diagnosis not present

## 2019-07-08 DIAGNOSIS — R1011 Right upper quadrant pain: Secondary | ICD-10-CM | POA: Diagnosis not present

## 2019-07-08 DIAGNOSIS — I10 Essential (primary) hypertension: Secondary | ICD-10-CM | POA: Diagnosis not present

## 2019-07-08 DIAGNOSIS — R112 Nausea with vomiting, unspecified: Secondary | ICD-10-CM | POA: Diagnosis not present

## 2019-07-08 DIAGNOSIS — R079 Chest pain, unspecified: Secondary | ICD-10-CM | POA: Diagnosis not present

## 2019-07-08 LAB — COMPREHENSIVE METABOLIC PANEL
ALT: 15 U/L (ref 0–44)
AST: 18 U/L (ref 15–41)
Albumin: 4.9 g/dL (ref 3.5–5.0)
Alkaline Phosphatase: 41 U/L (ref 38–126)
Anion gap: 13 (ref 5–15)
BUN: 18 mg/dL (ref 6–20)
CO2: 21 mmol/L — ABNORMAL LOW (ref 22–32)
Calcium: 9.9 mg/dL (ref 8.9–10.3)
Chloride: 102 mmol/L (ref 98–111)
Creatinine, Ser: 0.71 mg/dL (ref 0.44–1.00)
GFR calc Af Amer: >60 mL/min (ref >60)
GFR calc non Af Amer: >60 mL/min (ref >60)
Glucose, Bld: 105 mg/dL — ABNORMAL HIGH (ref 70–99)
Potassium: 2.8 mmol/L — ABNORMAL LOW (ref 3.5–5.1)
Sodium: 136 mmol/L (ref 135–145)
Total Bilirubin: 0.5 mg/dL (ref 0.3–1.2)
Total Protein: 8.5 g/dL — ABNORMAL HIGH (ref 6.5–8.1)

## 2019-07-08 LAB — URINALYSIS, COMPLETE (UACMP) WITH MICROSCOPIC
Bacteria, UA: NONE SEEN
Bilirubin Urine: NEGATIVE
Glucose, UA: NEGATIVE mg/dL
Ketones, ur: 20 mg/dL — AB
Leukocytes,Ua: NEGATIVE
Nitrite: NEGATIVE
Protein, ur: 30 mg/dL — AB
Specific Gravity, Urine: 1.031 — ABNORMAL HIGH (ref 1.005–1.030)
pH: 6 (ref 5.0–8.0)

## 2019-07-08 LAB — CBC
HCT: 31.1 % — ABNORMAL LOW (ref 36.0–46.0)
Hemoglobin: 9.9 g/dL — ABNORMAL LOW (ref 12.0–15.0)
MCH: 27.6 pg (ref 26.0–34.0)
MCHC: 31.8 g/dL (ref 30.0–36.0)
MCV: 86.6 fL (ref 80.0–100.0)
Platelets: 402 10*3/uL — ABNORMAL HIGH (ref 150–400)
RBC: 3.59 MIL/uL — ABNORMAL LOW (ref 3.87–5.11)
RDW: 14.5 % (ref 11.5–15.5)
WBC: 6.1 10*3/uL (ref 4.0–10.5)
nRBC: 0 % (ref 0.0–0.2)

## 2019-07-08 LAB — POCT PREGNANCY, URINE: Preg Test, Ur: NEGATIVE

## 2019-07-08 LAB — LIPASE, BLOOD: Lipase: 32 U/L (ref 11–51)

## 2019-07-08 IMAGING — CT CT ABD-PELV W/ CM
2 of 5 series · 15 of 46 positions shown, 17 images · IV contrast (APPLIED)
Comparison: CT abdomen pelvis [DATE]

CLINICAL DATA: Acute generalized abdominal pain for 3 days,
constipation for 2 days

EXAM:
CT ABDOMEN AND PELVIS WITH CONTRAST
TECHNIQUE: Multidetector CT imaging of the abdomen and pelvis was performed
using the standard protocol following bolus administration of
intravenous contrast.
CONTRAST:  100mL OMNIPAQUE IOHEXOL 300 MG/ML  SOLN

[Series 2: routine abd/pel with · axial · 0.66mm/px · z∈[-847,-472]mm · 12 of 85 slices shown, 14 images]
[im 5/85  soft-tissue]
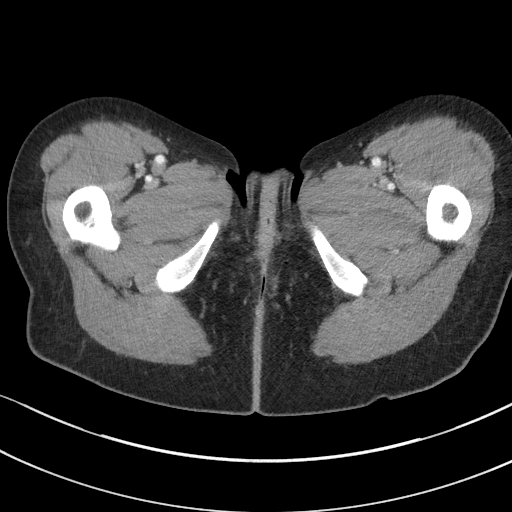
[im 5/85  bone]
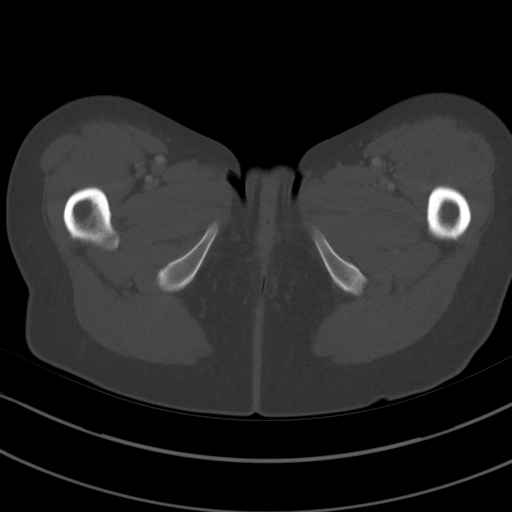
[im 15/85  soft-tissue]
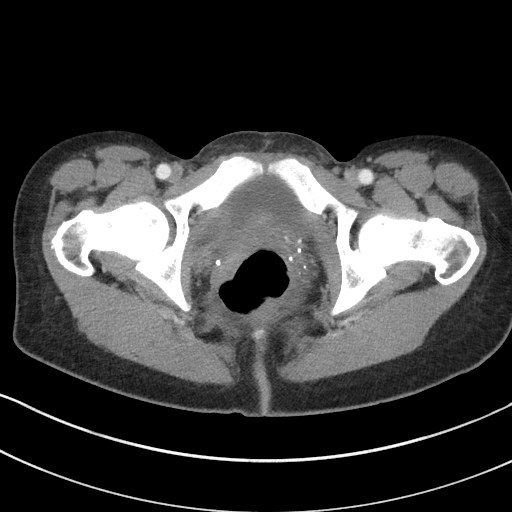
[im 19/85  soft-tissue]
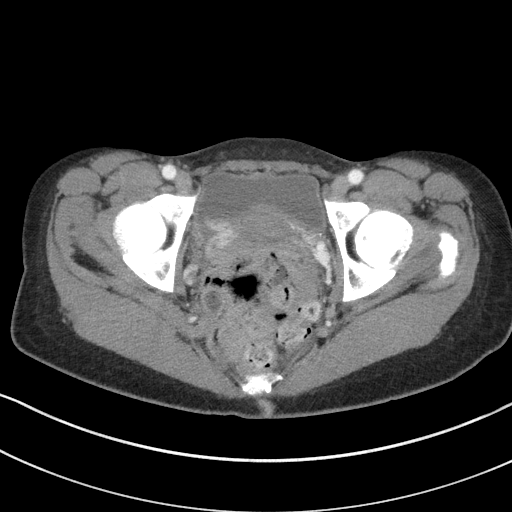
[im 24/85  soft-tissue]
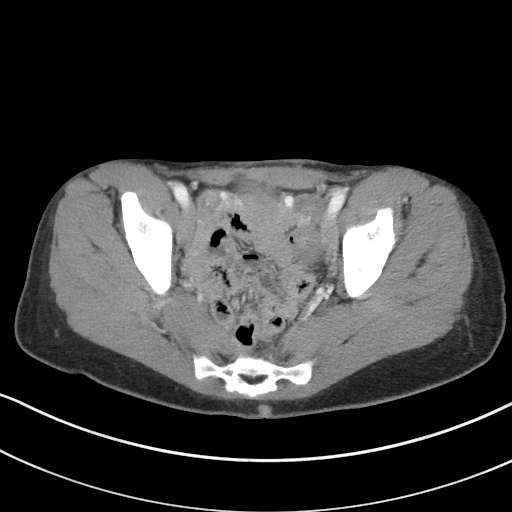
[im 33/85  soft-tissue]
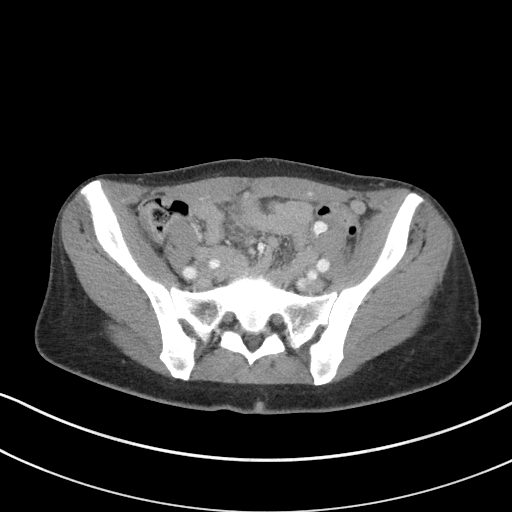
[im 38/85  soft-tissue]
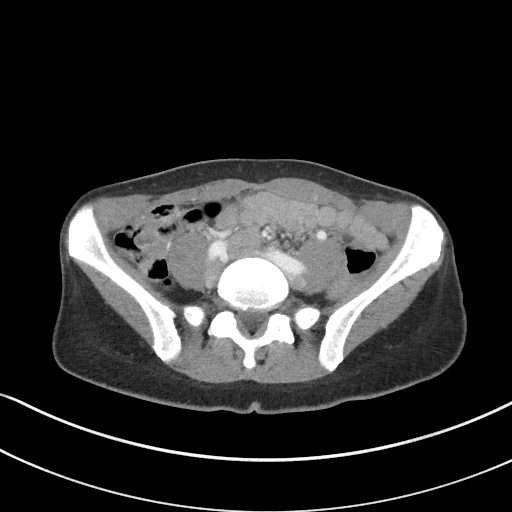
[im 47/85  soft-tissue]
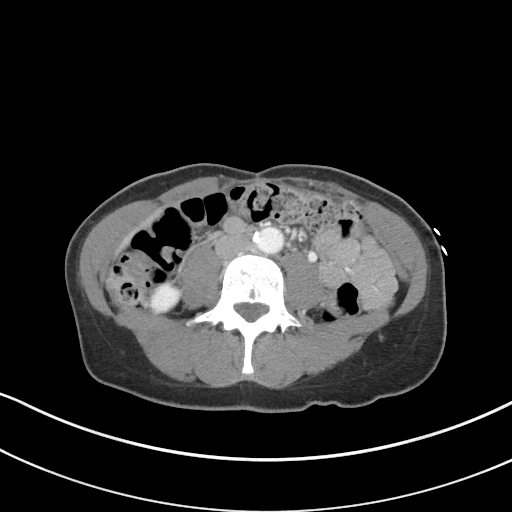
[im 52/85  soft-tissue]
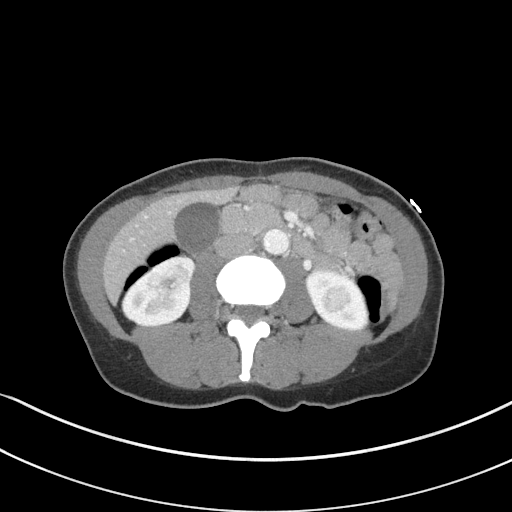
[im 61/85  soft-tissue]
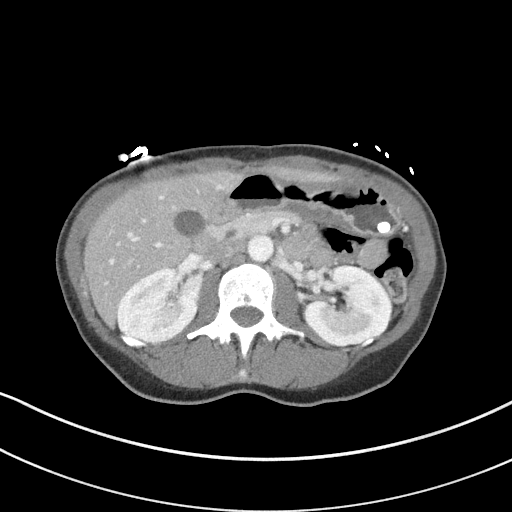
[im 61/85  bone]
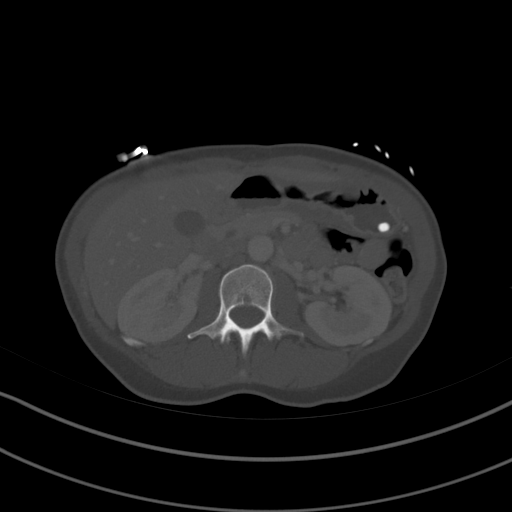
[im 66/85  soft-tissue]
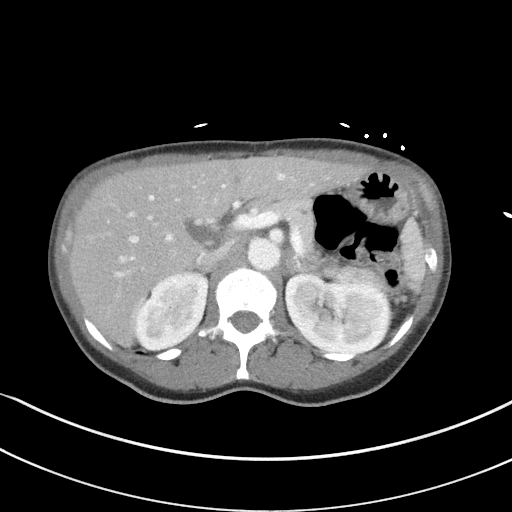
[im 71/85  soft-tissue]
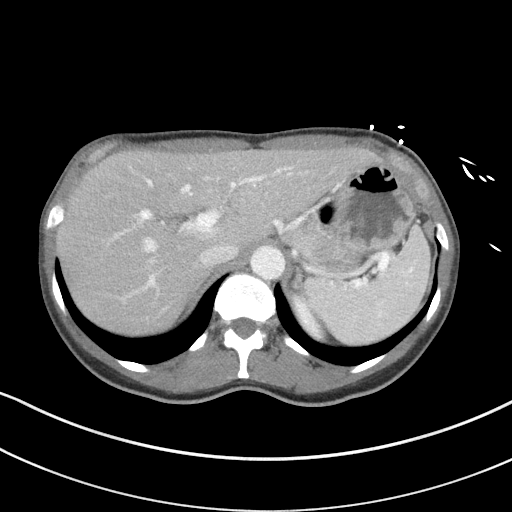
[im 80/85  soft-tissue]
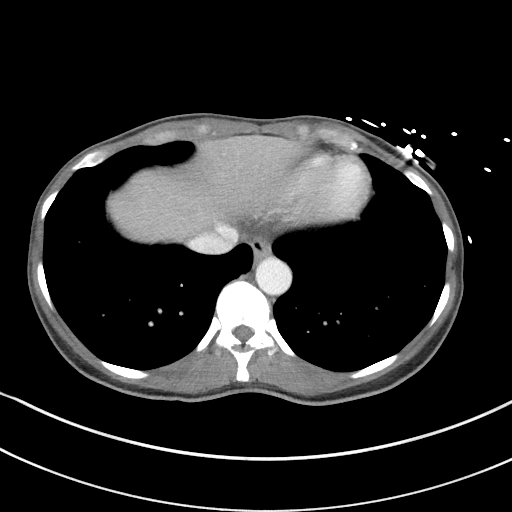

[Series 5: coronal st · coronal · 0.69mm/px · 3 of 64 slices shown]
[im 22/64  soft-tissue]
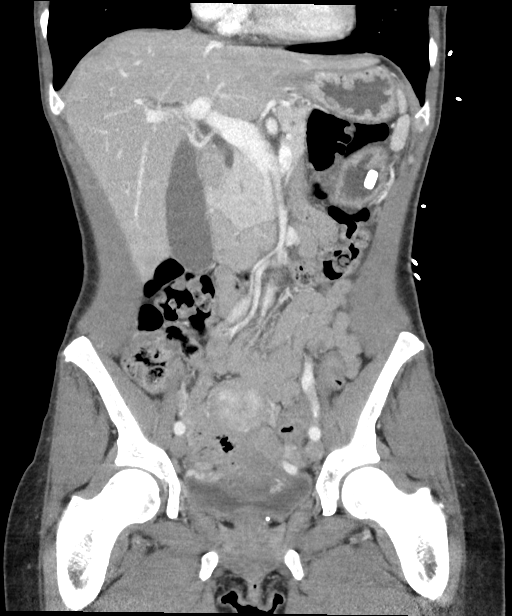
[im 29/64  soft-tissue]
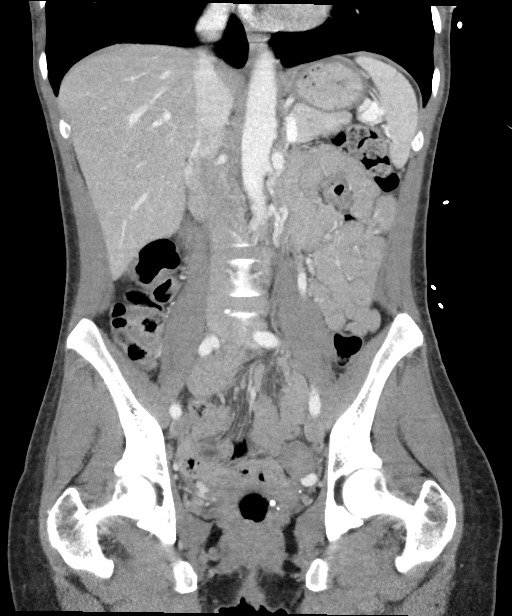
[im 36/64  soft-tissue]
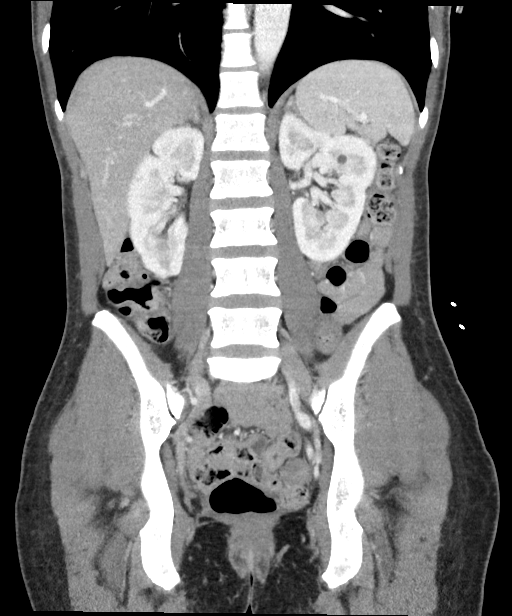

[15 of 46 positions shown; findings below may reference images not displayed]

FINDINGS: Lower chest: Lung bases are clear. Normal heart size. No pericardial
effusion.

Hepatobiliary: No focal liver abnormality is seen. No calcified
gallstones, pericholecystic inflammation or gallbladder wall
thickening. Borderline dilation of the common bile duct (8 mm) is
nonspecific particularly given stability from comparison exam. No
visible intraductal gallstones.

Pancreas: Unremarkable. No pancreatic ductal dilatation or
surrounding inflammatory changes.

Spleen: Normal in size without focal abnormality.

Adrenals/Urinary Tract: Normal adrenal glands. Subcentimeter
hypoattenuating focus in the interpolar left kidney, too small to
fully characterize on CT imaging but statistically likely benign.
Kidneys are otherwise unremarkable, without renal calculi,
suspicious lesion, or hydronephrosis. Mild bladder wall thickening.

Stomach/Bowel: Distal esophagus, stomach and duodenal sweep are
unremarkable. No small bowel wall thickening or dilatation. No
evidence of obstruction. A normal appendix is visualized. There is
segmental thickening of the sigmoid colon which is similar to
comparison exam. Remaining portions of the colon are free from
colonic dilatation or wall thickening.

Vascular/Lymphatic: Atherosclerotic plaque within the normal caliber
aorta. Bilateral parametrial venous engorgement. Evaluation of the
lymph nodes is limited given a paucity of intraperitoneal fat. No
pathologically enlarged nodes are clearly identified.

Reproductive: Retroflexed uterus appears adherent to the anterior
abdominal wall, likely reflecting postsurgical change from multiple
prior Caesarean sections. There is a typical enhancement of the
uterine fundus with a small amount of fluid or thickening of the
fundal endometrium which is nonspecific on this CT exam. No
concerning adnexal lesions. Metallic clip in the pelvis may reflect
prior ligation.

Other: Paucity of intraperitoneal fat. No free fluid or free air.
Small stable ventral hernia of the midline rectus sheath ([DATE],
6/51) unchanged from priors. Postsurgical changes of the low
anterior abdominal wall.

Musculoskeletal: Multilevel degenerative changes are present in the
imaged portions of the spine. No acute osseous abnormality or
suspicious osseous lesion.
IMPRESSION: 1. Mild segmental thickening of the sigmoid colon. Could reflect
underdistention or sequela of chronic inflammation. Consider
outpatient correlation with direct visualization.
2. Retroflexed uterus appears adherent to the anterior abdominal
wall, likely reflecting postsurgical change from multiple prior
Caesarean sections. There is a small amount of fluid or thickening
of the fundal endometrium which is nonspecific on this CT exam.
Consider further evaluation with pelvic ultrasound as clinically
indicated.
3. Bilateral parametrial venous engorgement, which can be reactive
to the above process or seen with pelvic congestion syndrome in the
appropriate clinical setting.
4. Mild bladder wall thickening. Correlate with urinalysis to
exclude cystitis.
5. Unchanged fat containing ventral hernia of the midline rectus
sheath.
6. Aortic Atherosclerosis ([DF]-[DF]).

## 2019-07-08 MED ORDER — SUCRALFATE 1 G PO TABS: 1.0000 g | ORAL_TABLET | Freq: Four times a day (QID) | ORAL | 0 refills | Status: DC

## 2019-07-08 MED ORDER — ONDANSETRON 8 MG PO TBDP: 8.0000 mg | ORAL_TABLET | Freq: Three times a day (TID) | ORAL | 0 refills | Status: DC | PRN

## 2019-07-08 MED ORDER — ONDANSETRON HCL 4 MG/2ML IJ SOLN
INTRAMUSCULAR | Status: AC
  Administered 2019-07-08: 4 mg via INTRAVENOUS
  Filled 2019-07-08: qty 2

## 2019-07-08 MED ORDER — IOHEXOL 300 MG/ML  SOLN
100.0000 mL | Freq: Once | INTRAMUSCULAR | Status: AC | PRN
  Administered 2019-07-08: 100 mL via INTRAVENOUS

## 2019-07-08 MED ORDER — MORPHINE SULFATE (PF) 4 MG/ML IV SOLN
4.0000 mg | Freq: Once | INTRAVENOUS | Status: AC
  Administered 2019-07-08: 4 mg via INTRAVENOUS
  Filled 2019-07-08: qty 1

## 2019-07-08 MED ORDER — PANTOPRAZOLE SODIUM 40 MG PO TBEC: 40.0000 mg | DELAYED_RELEASE_TABLET | Freq: Every day | ORAL | 0 refills | Status: DC

## 2019-07-08 MED ORDER — ONDANSETRON HCL 4 MG/2ML IJ SOLN
4.0000 mg | Freq: Once | INTRAMUSCULAR | Status: AC
  Administered 2019-07-08: 22:00:00 4 mg via INTRAVENOUS

## 2019-07-08 NOTE — ED Triage Notes (Signed)
By Charisse Klinefelter c/o bilateral lower abdominal pain X 3 days, nausea and emesis. Constipation X 2 days. VSS with EMS, 125 CBG. Pt alert and oriented X4, cooperative, RR even and unlabored, color WNL. Pt in NAD.

## 2019-07-08 NOTE — ED Notes (Signed)
Pt ambulatory to toilet with a steady gait. This Rn at bedside.

## 2019-07-08 NOTE — ED Provider Notes (Signed)
[facility] Emergency Department Provider Note ____________________________________________   First MD Initiated Contact with Patient 07/08/19 2119     (approximate)  I have reviewed the triage vital signs and the nursing notes.   HISTORY  Chief Complaint Abdominal Pain    HPI Shirley [name] is a 43 y.o. female with PMH as noted below including GERD and PUD presents with abdominal pain, persistent course over the last 3 days, mainly in the mid and upper abdomen, and associated with nausea and vomiting.  The patient states that she has been unable to hold anything down.  She also states that she has not had any bowel movements in the last 2 days, although she does not specifically feel constipated.  She denies fever.  She has no urinary symptoms, vaginal bleeding, or discharge.  Patient states that she had similar pain earlier in the month and was treated with Carafate and Protonix.  She states that when she ran out of these, the pain came back.  Past Medical History:  Diagnosis Date  . Anemia   . Bacteremia   . Blood dyscrasia    has to apply a lot of pressure to stop bleeding  . GERD (gastroesophageal reflux disease)   . Heart murmur    with pregnancy  . History of hiatal hernia   . Hypertension    4-5 years ago, no meds  . Kidney infection    patient states I might have a kidney infection  . Migraine    migraines  . Pyelonephritis     Patient Active Problem List   Diagnosis Date Noted  . Pyelonephritis 08/03/2017  . Bacteremia 08/03/2017  . Hypokalemia 08/03/2017  . Tobacco use 08/03/2017  . Acute postoperative pain 07/31/2016  . Menometrorrhagia 04/09/2016  . Anemia 04/09/2016  . Abnormal uterine bleeding (AUB) 04/09/2016  . Essential hypertension 11/17/2009    Past Surgical History:  Procedure Laterality Date  . ABDOMINAL HERNIA REPAIR    . CESAREAN SECTION     x 4  . CESAREAN SECTION     x 4  . ENDOMETRIAL ABLATION  07/31/2016   . HYSTEROSCOPY  07/31/2016   Procedure: HYSTEROSCOPY WITH HYDROTHERMAL ABLATION;  Surgeon: Easton Bing, MD;  Location: [facility];  Service: Gynecology;;    Prior to Admission medications   Medication Sig Start Date End Date Taking? Authorizing Provider  acetaminophen (TYLENOL) 500 MG tablet Take 1,000 mg by mouth every 6 (six) hours as needed for mild pain or headache.    [provider]  albuterol (PROVENTIL HFA;VENTOLIN HFA) 108 (90 Base) MCG/ACT inhaler Inhale 2 puffs into the lungs every 6 (six) hours as needed for wheezing or shortness of breath.    [provider]  amLODipine (NORVASC) 2.5 MG tablet Take 2.5 mg by mouth daily. 01/08/18   [provider]  linaclotide Karlene Einstein) 72 MCG capsule Take 1 capsule (72 mcg total) by mouth daily before breakfast. 03/14/18   Danis, Andreas Blower, MD  methocarbamol (ROBAXIN) 500 MG tablet Take 1 tablet (500 mg total) by mouth 2 (two) times daily. Patient not taking: Reported on 09/10/2018 06/17/18   Hedges, Tinnie Gens, PA-C  metoCLOPramide (REGLAN) 5 MG tablet Take 1 tablet (5 mg total) by mouth every 6 (six) hours as needed for nausea. 06/03/18   Sherrilyn Rist, MD  Multiple Vitamins-Minerals (WOMENS DAILY FORMULA PO) Take 1 tablet by mouth daily.    [provider]  omeprazole (PRILOSEC) 20 MG capsule Take 20 mg  by mouth 2 (two) times daily before a meal.    [provider]  ondansetron (ZOFRAN ODT) 4 MG disintegrating tablet Take 1 tablet (4 mg total) by mouth every 8 (eight) hours as needed for nausea or vomiting. 05/05/18   Pricilla LovelessGoldston, Scott, MD  ondansetron (ZOFRAN ODT) 8 MG disintegrating tablet Take 1 tablet (8 mg total) by mouth every 8 (eight) hours as needed for nausea or vomiting. 07/08/19   Dionne BucySiadecki, Paeton Studer, MD  pantoprazole (PROTONIX) 20 MG tablet Take 1 tablet (20 mg total) by mouth 2 (two) times daily before a meal. 06/19/19   Geoffery Lyonselo, Douglas, MD  pantoprazole (PROTONIX) 40 MG tablet Take 1 tablet (40 mg  total) by mouth daily. 07/08/19 08/07/19  Dionne BucySiadecki, Catherine Oak, MD  polyethylene glycol (MIRALAX / Ethelene HalGLYCOLAX) packet Take 17 g by mouth daily as needed for mild constipation.     [provider]  sucralfate (CARAFATE) 1 g tablet Take 1 tablet (1 g total) by mouth 4 (four) times daily -  with meals and at bedtime. 06/19/19   Geoffery Lyonselo, Douglas, MD  sucralfate (CARAFATE) 1 g tablet Take 1 tablet (1 g total) by mouth 4 (four) times daily for 15 days. 07/08/19 07/23/19  Dionne BucySiadecki, Jovin Fester, MD    Allergies Lisinopril, Aspirin, Ibuprofen, and Other  Family History  Problem Relation Age of Onset  . Hypertension Mother   . Colon cancer Father [id number]  . Stroke Brother     Social History Social History   Tobacco Use  . Smoking status: Former Smoker    Packs/day: 0.50    Types: Cigarettes  . Smokeless tobacco: Never Used  Substance Use Topics  . Alcohol use: Yes    Comment: occasionally  . Drug use: Yes    Types: Marijuana    Review of Systems  Constitutional: No fever. Eyes: No visual changes. ENT: No sore throat. Cardiovascular: Denies chest pain. Respiratory: Denies shortness of breath. Gastrointestinal: Positive for nausea and vomiting. Genitourinary: Negative for dysuria.  Musculoskeletal: Negative for back pain. Skin: Negative for rash. Neurological: Negative for headache.   ____________________________________________   PHYSICAL EXAM:  VITAL SIGNS: ED Triage Vitals  Enc Vitals Group     BP 07/08/19 1849 (!) 135/92     Pulse Rate 07/08/19 1849 96     Resp 07/08/19 1849 18     Temp 07/08/19 1849 98.6 F (37 C)     Temp Source 07/08/19 1849 Oral     SpO2 07/08/19 1849 99 %     Weight 07/08/19 1850 115 lb (52.2 kg)     Height 07/08/19 1850 5\' 2"  (1.575 m)     Head Circumference --      Peak Flow --      Pain Score --      Pain Loc --      Pain Edu? --      Excl. in GC? --     Constitutional: Alert and oriented.  Uncomfortable appearing but in no acute  distress. Eyes: Conjunctivae are normal.  No scleral icterus. Head: Atraumatic. Nose: No congestion/rhinnorhea. Mouth/Throat: Mucous membranes are slightly dry.   Neck: Normal range of motion.  Cardiovascular: Good peripheral circulation. Respiratory: Normal respiratory effort.  No retractions. Gastrointestinal: Soft with moderate upper abdominal tenderness bilaterally.  No distention.  Genitourinary: No flank tenderness. Musculoskeletal:   Extremities warm and well perfused.  Neurologic:  Normal speech and language. No gross focal neurologic deficits are appreciated.  Skin:  Skin is warm and dry. No rash noted. Psychiatric:  Mood and affect are normal. Speech and behavior are normal.  ____________________________________________   LABS (all labs ordered are listed, but only abnormal results are displayed)  Labs Reviewed  COMPREHENSIVE METABOLIC PANEL - Abnormal; Notable for the following components:      Result Value   Potassium 2.8 (*)    CO2 21 (*)    Glucose, Bld 105 (*)    Total Protein 8.5 (*)    All other components within normal limits  CBC - Abnormal; Notable for the following components:   RBC 3.59 (*)    Hemoglobin 9.9 (*)    HCT 31.1 (*)    Platelets 402 (*)    All other components within normal limits  URINALYSIS, COMPLETE (UACMP) WITH MICROSCOPIC - Abnormal; Notable for the following components:   Color, Urine YELLOW (*)    APPearance HAZY (*)    Specific Gravity, Urine 1.031 (*)    Hgb urine dipstick SMALL (*)    Ketones, ur 20 (*)    Protein, ur 30 (*)    All other components within normal limits  LIPASE, BLOOD  POC URINE PREG, ED  POCT PREGNANCY, URINE   ____________________________________________  EKG   ____________________________________________  RADIOLOGY  CT abdomen: Mild thickening of the sigmoid colon.  Small amount of thickening of the endometrium.  Possible findings of pelvic congestion.  ____________________________________________    PROCEDURES  Procedure(s) performed: No  Procedures  Critical Care performed: No ____________________________________________   INITIAL IMPRESSION / ASSESSMENT AND PLAN / ED COURSE  Pertinent labs & imaging results that were available during my care of the patient were reviewed by me and considered in my medical decision making (see chart for details).  43 year old female with PMH as noted above including a history of GERD and peptic ulcer presents with upper abdominal pain over the last 3 days.  It is associated with nausea and vomiting, but no fever.  She has had similar flares before.  The patient states that she was seen earlier this month for similar pain and treated with Carafate and Protonix, and several days after she ran out of the medications the symptoms came back.  I reviewed the past medical records in [place].  The patient was seen at the [facility] long ED on 06/19/2019 with upper abdominal pain.  She has had a right upper quadrant ultrasound which was negative.  She was started on Protonix and Carafate.  She was subsequently seen yesterday at [facility] urgent care with upper abdominal pain and elevated blood pressure, and was instructed to come to the ED.  Apparently she waited at the [facility], ED but then decided to go home because the wait was too long.  Previously, she had a CT abdomen in January which was negative.  ----------------------------------------- 11:17 PM on 07/08/2019 -----------------------------------------  The patient states that she feels much more comfortable.  She appears well.  CT shows no significant upper abdominal findings except for some mild thickening of the sigmoid colon.  The patient's uterus shows some fluid or thickening of the endometrium and possible pelvic congestion.  We will obtain a pelvic ultrasound to further evaluate.  If this is negative, anticipate discharge home.  I have signed the patient out to the oncoming physician Dr. Alfred Levins.   ____________________________________________   FINAL CLINICAL IMPRESSION(S) / ED DIAGNOSES  Final diagnoses:  Pain of upper abdomen  Abdominal pain      NEW MEDICATIONS STARTED DURING THIS VISIT:  New Prescriptions   ONDANSETRON (ZOFRAN ODT) 8  MG DISINTEGRATING TABLET    Take 1 tablet (8 mg total) by mouth every 8 (eight) hours as needed for nausea or vomiting.   PANTOPRAZOLE (PROTONIX) 40 MG TABLET    Take 1 tablet (40 mg total) by mouth daily.   SUCRALFATE (CARAFATE) 1 G TABLET    Take 1 tablet (1 g total) by mouth 4 (four) times daily for 15 days.     Note:  This document was prepared using Dragon voice recognition software and may include unintentional dictation errors.    Dionne Bucy, MD 07/08/19 2318

## 2019-07-08 NOTE — ED Notes (Signed)
No answer for vitals recheck x2 and nor visible in lobby

## 2019-07-08 NOTE — ED Notes (Signed)
Patient transported to CT 

## 2019-07-08 NOTE — ED Notes (Signed)
Pt actively vomiting at this time.

## 2019-07-08 NOTE — Discharge Instructions (Signed)
You should follow-up with a gastroenterologist within the next several weeks, either Dr. Bonna Gains here or with your prior gastroenterologist in [place].  Take the Protonix daily for the next several weeks, and you may also take the Carafate as needed up to 4 times daily with meals.  We have also prescribed Zofran (ondansetron) for nausea.  Return to the ER immediately for new, worsening, or persistent severe abdominal pain, vomiting, fever, weakness, or any other new or worsening symptoms that concern you.

## 2019-07-09 ENCOUNTER — Emergency Department: Payer: BC Managed Care – PPO

## 2019-07-09 DIAGNOSIS — N854 Malposition of uterus: Secondary | ICD-10-CM | POA: Diagnosis not present

## 2019-07-09 DIAGNOSIS — R1084 Generalized abdominal pain: Secondary | ICD-10-CM | POA: Diagnosis not present

## 2019-07-09 IMAGING — US US PELVIS COMPLETE WITH TRANSVAGINAL
1 series · 13 of 25 positions shown · non-contrast
Comparison: CT of the abdomen pelvis dated [DATE] and pelvic
ultrasound dated [DATE].

CLINICAL DATA: 43-year-old female with acute generalized abdominal
pain x3 days. History of endometrial ablation and multiple
C-sections.

EXAM:
TRANSABDOMINAL AND TRANSVAGINAL ULTRASOUND OF PELVIS
TECHNIQUE: Both transabdominal and transvaginal ultrasound examinations of the
pelvis were performed. Transabdominal technique was performed for
global imaging of the pelvis including uterus, ovaries, adnexal
regions, and pelvic cul-de-sac. It was necessary to proceed with
endovaginal exam following the transabdominal exam to visualize the
endometrium and ovaries.

[Series 1: us pelvis complete with transvaginal · 13 of 91 slices shown]
[im 1/91]
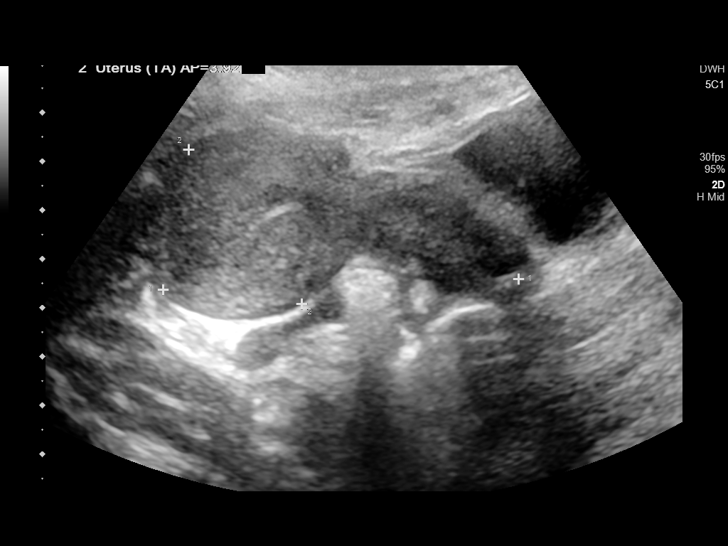
[im 8/91]
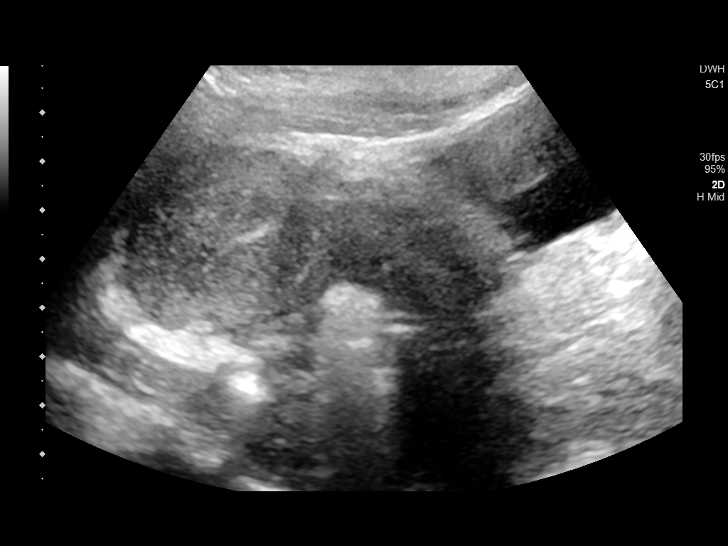
[im 16/91]
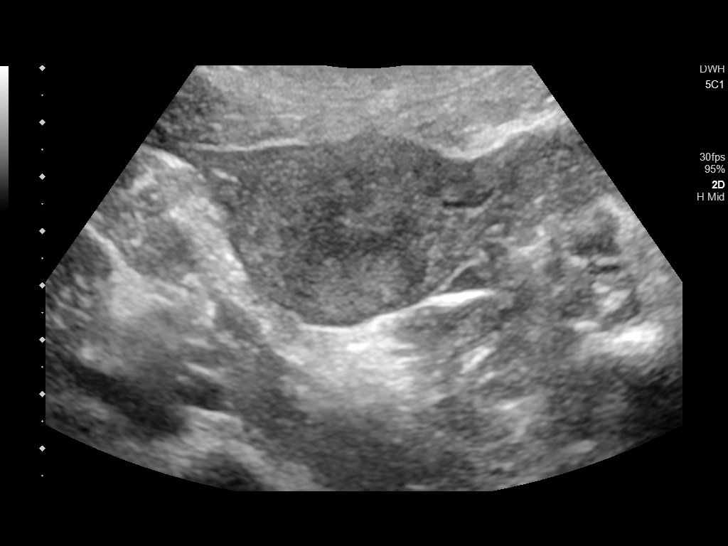
[im 23/91]
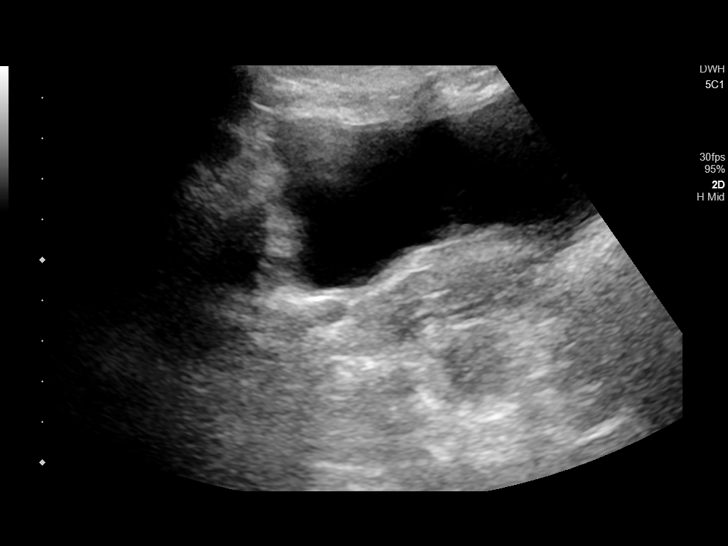
[im 31/91]
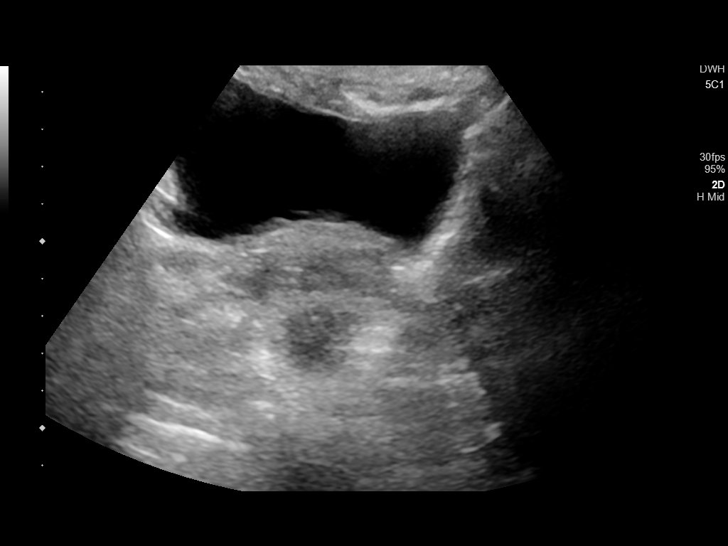
[im 38/91]
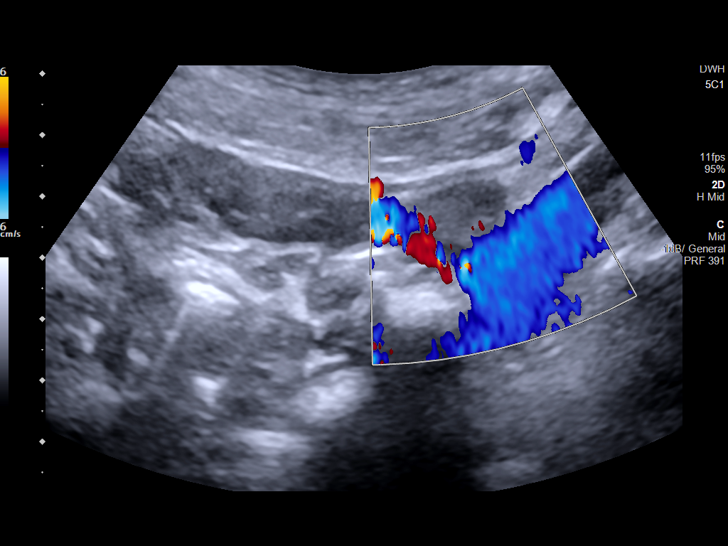
[im 46/91]
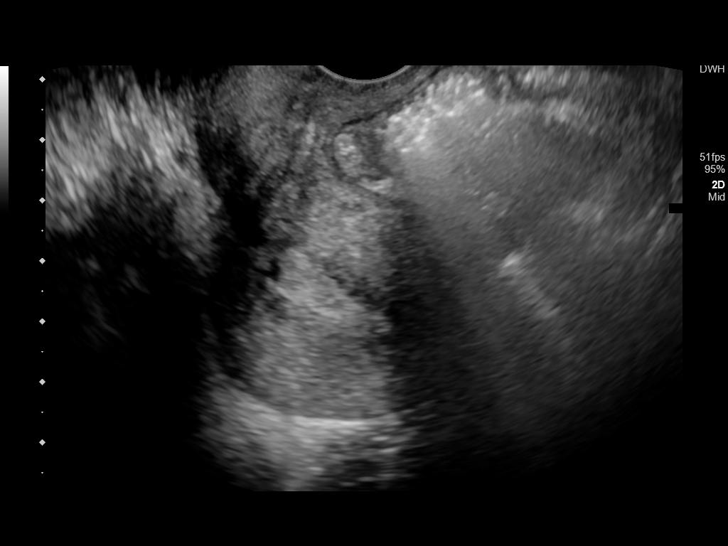
[im 53/91]
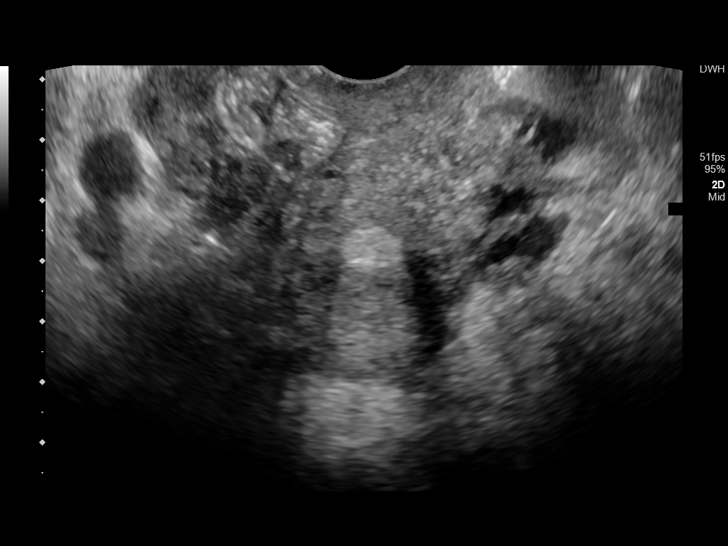
[im 61/91]
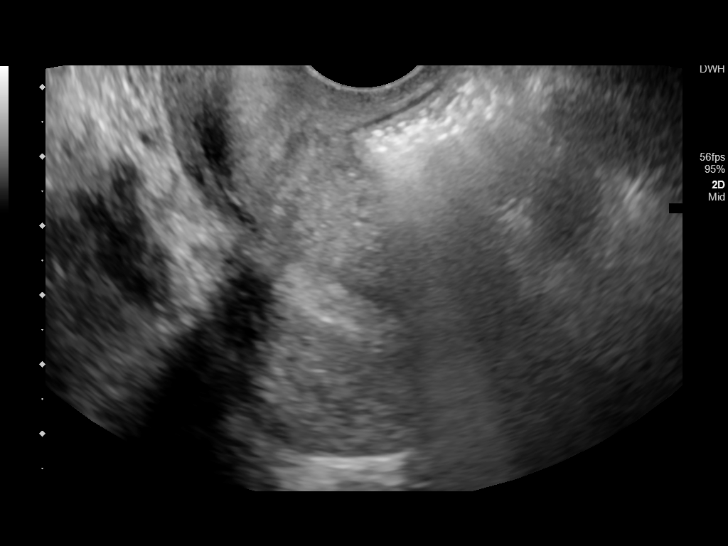
[im 68/91]
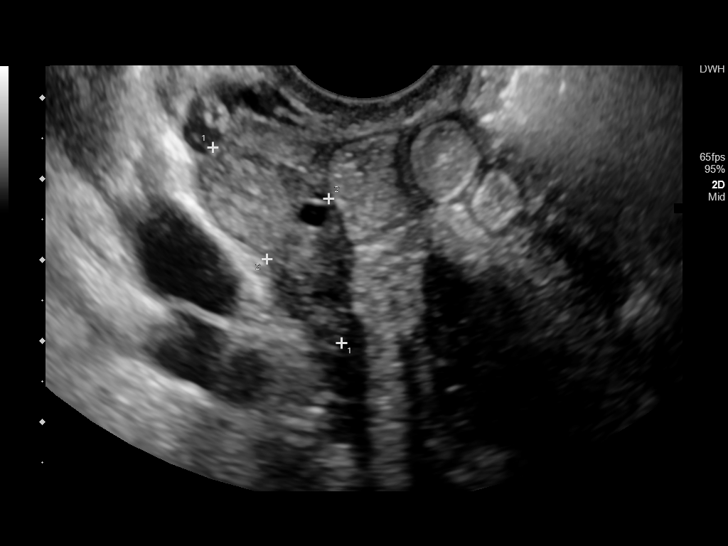
[im 76/91]
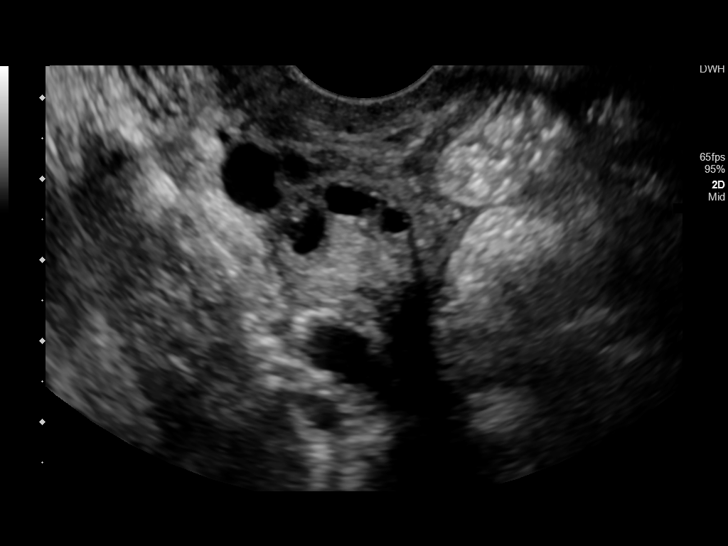
[im 83/91]
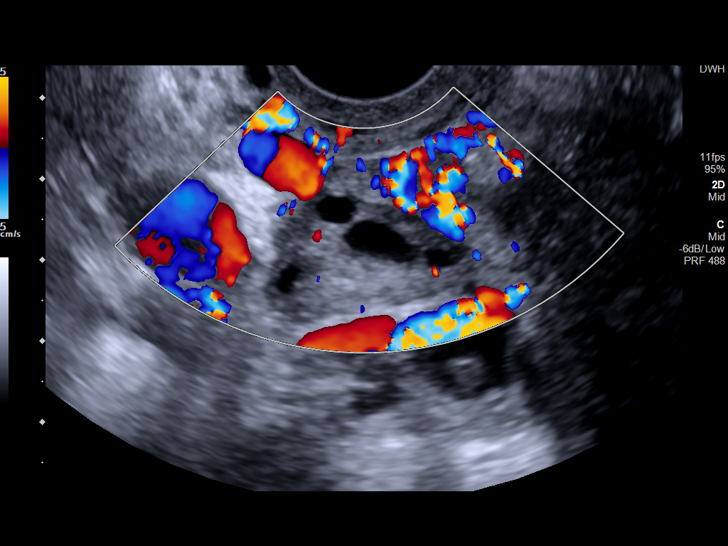
[im 91/91]
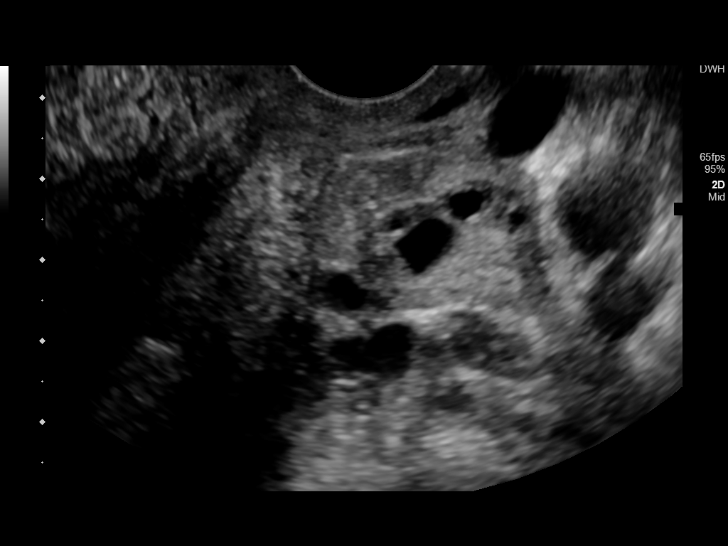

[13 of 25 positions shown; findings below may reference images not displayed]

FINDINGS: Uterus

Measurements: 7.3 x 3.9 x 4.4 cm = volume: 66 mL. The uterus is
retroflexed otherwise unremarkable.

Endometrium

Thickness: 7 mm. Trace fluid noted in the lower endometrium. No
abnormal endometrial vascularity identified.

Right ovary

Measurements: 2.9 x 1.1 x 1.2 cm = volume: 1.9 mL. Normal
appearance/no adnexal mass.

Left ovary

Measurements: 2.4 x 1.6 x 2.3 cm = volume: 4.6 mL. Normal
appearance/no adnexal mass.

Other findings

There is a crescentic echogenic structure in the region of the left
adnexa (image 80/95) with clear posterior shadowing. No
corresponding structure noted on the earlier CT.
IMPRESSION: 1. Retroflexed uterus.
2. Unremarkable endometrium and ovaries.

## 2019-07-09 MED ORDER — POTASSIUM CHLORIDE CRYS ER 20 MEQ PO TBCR
40.0000 meq | EXTENDED_RELEASE_TABLET | Freq: Once | ORAL | Status: AC
  Administered 2019-07-09: 40 meq via ORAL
  Filled 2019-07-09: qty 2

## 2019-07-09 MED ORDER — METOCLOPRAMIDE HCL 5 MG/ML IJ SOLN
10.0000 mg | Freq: Once | INTRAMUSCULAR | Status: AC
  Administered 2019-07-09: 10 mg via INTRAVENOUS
  Filled 2019-07-09: qty 2

## 2019-07-09 NOTE — ED Notes (Signed)
Patient transported to Ultrasound 

## 2019-07-09 NOTE — ED Provider Notes (Signed)
[place] negative for acute pathology. Patient tolerating PO with no further episodes of vomiting. Will dc home with plan and prescriptions left by Dr. Marisa Severin.         I have personally reviewed the images performed during this visit and I agree with the Radiologist's read.   Interpretation by Radiologist:  Ct Abdomen Pelvis W Contrast  Result Date: 07/08/2019 CLINICAL DATA:  Acute generalized abdominal pain for 3 days, constipation for 2 days EXAM: CT ABDOMEN AND PELVIS WITH CONTRAST TECHNIQUE: Multidetector CT imaging of the abdomen and pelvis was performed using the standard protocol following bolus administration of intravenous contrast. CONTRAST:  OMNIPAQUE IOHEXOL 300 MG/ML  SOLN COMPARISON:  CT abdomen pelvis 03/11/2019 FINDINGS: Lower chest: Lung bases are clear. Normal heart size. No pericardial effusion. Hepatobiliary: No focal liver abnormality is seen. No calcified gallstones, pericholecystic inflammation or gallbladder wall thickening. Borderline dilation of the common bile duct (8 mm) is nonspecific particularly given stability from comparison exam. No visible intraductal gallstones. Pancreas: Unremarkable. No pancreatic ductal dilatation or surrounding inflammatory changes. Spleen: Normal in size without focal abnormality. Adrenals/Urinary Tract: Normal adrenal glands. Subcentimeter hypoattenuating focus in the interpolar left kidney, too small to fully characterize on CT imaging but statistically likely benign. Kidneys are otherwise unremarkable, without renal calculi, suspicious lesion, or hydronephrosis. Mild bladder wall thickening. Stomach/Bowel: Distal esophagus, stomach and duodenal sweep are unremarkable. No small bowel wall thickening or dilatation. No evidence of obstruction. A normal appendix is visualized. There is segmental thickening of the sigmoid colon which is similar to comparison exam. Remaining portions of the colon are free from colonic dilatation or wall  thickening. Vascular/Lymphatic: Atherosclerotic plaque within the normal caliber aorta. Bilateral parametrial venous engorgement. Evaluation of the lymph nodes is limited given a paucity of intraperitoneal fat. No pathologically enlarged nodes are clearly identified. Reproductive: Retroflexed uterus appears adherent to the anterior abdominal wall, likely reflecting postsurgical change from multiple prior Caesarean sections. There is a typical enhancement of the uterine fundus with a small amount of fluid or thickening of the fundal endometrium which is nonspecific on this CT exam. No concerning adnexal lesions. Metallic clip in the pelvis may reflect prior ligation. Other: Paucity of intraperitoneal fat. No free fluid or free air. Small stable ventral hernia of the midline rectus sheath (2/28, 6/51) unchanged from priors. Postsurgical changes of the low anterior abdominal wall. Musculoskeletal: Multilevel degenerative changes are present in the imaged portions of the spine. No acute osseous abnormality or suspicious osseous lesion. IMPRESSION: 1. Mild segmental thickening of the sigmoid colon. Could reflect underdistention or sequela of chronic inflammation. Consider outpatient correlation with direct visualization. 2. Retroflexed uterus appears adherent to the anterior abdominal wall, likely reflecting postsurgical change from multiple prior Caesarean sections. There is a small amount of fluid or thickening of the fundal endometrium which is nonspecific on this CT exam. Consider further evaluation with pelvic ultrasound as clinically indicated. 3. Bilateral parametrial venous engorgement, which can be reactive to the above process or seen with pelvic congestion syndrome in the appropriate clinical setting. 4. Mild bladder wall thickening. Correlate with urinalysis to exclude cystitis. 5. Unchanged fat containing ventral hernia of the midline rectus sheath. 6. Aortic Atherosclerosis (ICD10-I70.0). Electronically  Signed   By: Kreg Shropshire M.D.   On: 07/08/2019 22:31   US Pelvic Complete With Transvaginal  Result Date: 07/09/2019 CLINICAL DATA:  43 year old female with acute generalized abdominal pain x3 days. History of endometrial ablation and multiple C-sections. EXAM: TRANSABDOMINAL AND TRANSVAGINAL ULTRASOUND OF  PELVIS TECHNIQUE: Both transabdominal and transvaginal ultrasound examinations of the pelvis were performed. Transabdominal technique was performed for global imaging of the pelvis including uterus, ovaries, adnexal regions, and pelvic cul-de-sac. It was necessary to proceed with endovaginal exam following the transabdominal exam to visualize the endometrium and ovaries. COMPARISON:  CT of the abdomen pelvis dated 07/08/2019 and pelvic ultrasound dated 05/05/2018. FINDINGS: Uterus Measurements: 7.3 x 3.9 x 4.4 cm = volume: 66 mL. The uterus is retroflexed otherwise unremarkable. Endometrium Thickness: 7 mm. Trace fluid noted in the lower endometrium. No abnormal endometrial vascularity identified. Right ovary Measurements: 2.9 x 1.1 x 1.2 cm = volume: 1.9 mL. Normal appearance/no adnexal mass. Left ovary Measurements: 2.4 x 1.6 x 2.3 cm = volume: 4.6 mL. Normal appearance/no adnexal mass. Other findings There is a crescentic echogenic structure in the region of the left adnexa (image 80/95) with clear posterior shadowing. No corresponding structure noted on the earlier CT. IMPRESSION: 1. Retroflexed uterus. 2. Unremarkable endometrium and ovaries. Electronically Signed   By: Anner Crete M.D.   On: 07/09/2019 01:49      Rudene Re, MD 07/09/19 [phone]

## 2019-07-21 ENCOUNTER — Telehealth: Payer: Self-pay | Admitting: Gastroenterology

## 2019-07-21 ENCOUNTER — Telehealth: Payer: Self-pay

## 2019-07-21 ENCOUNTER — Other Ambulatory Visit: Payer: Self-pay

## 2019-07-21 ENCOUNTER — Other Ambulatory Visit
Admission: RE | Admit: 2019-07-21 | Discharge: 2019-07-21 | Disposition: A | Discharge status: Home / Self Care | Payer: BC Managed Care – PPO | Source: Ambulatory Visit | Attending: Gastroenterology | Admitting: Gastroenterology

## 2019-07-21 ENCOUNTER — Encounter: Payer: Self-pay | Admitting: Gastroenterology

## 2019-07-21 ENCOUNTER — Inpatient Hospital Stay: Admission: RE | Admit: 2019-07-21 | Payer: BC Managed Care – PPO | Source: Ambulatory Visit

## 2019-07-21 ENCOUNTER — Encounter: Payer: Self-pay | Admitting: *Deleted

## 2019-07-21 ENCOUNTER — Ambulatory Visit (INDEPENDENT_AMBULATORY_CARE_PROVIDER_SITE_OTHER): Payer: BC Managed Care – PPO | Admitting: Gastroenterology

## 2019-07-21 ENCOUNTER — Ambulatory Visit: Payer: BC Managed Care – PPO | Admitting: Gastroenterology

## 2019-07-21 VITALS — BP 148/100 | HR 93 | Temp 98.2°F | Ht 62.0 in | Wt 120.4 lb

## 2019-07-21 DIAGNOSIS — Z20828 Contact with and (suspected) exposure to other viral communicable diseases: Secondary | ICD-10-CM | POA: Diagnosis not present

## 2019-07-21 DIAGNOSIS — Z01812 Encounter for preprocedural laboratory examination: Secondary | ICD-10-CM | POA: Diagnosis not present

## 2019-07-21 DIAGNOSIS — R9389 Abnormal findings on diagnostic imaging of other specified body structures: Secondary | ICD-10-CM

## 2019-07-21 DIAGNOSIS — R112 Nausea with vomiting, unspecified: Secondary | ICD-10-CM

## 2019-07-21 DIAGNOSIS — D649 Anemia, unspecified: Secondary | ICD-10-CM | POA: Diagnosis not present

## 2019-07-21 LAB — SARS CORONAVIRUS 2 (TAT 6-24 HRS): SARS Coronavirus 2: NEGATIVE

## 2019-07-21 MED ORDER — BISACODYL EC 5 MG PO TBEC: DELAYED_RELEASE_TABLET | ORAL | 0 refills | Status: DC

## 2019-07-21 MED ORDER — NA SULFATE-K SULFATE-MG SULF 17.5-3.13-1.6 GM/177ML PO SOLN: 354.0000 mL | Freq: Once | ORAL | 0 refills | Status: AC

## 2019-07-21 NOTE — Progress Notes (Signed)
Shirley [name] [address]  [address]  [place], [place] [id number]  Main: [phone]  Fax: [phone]   Gastroenterology Consultation  Referring Provider:     [facility] Primary Care Physician:  [facility] Reason for Consultation:   Nausea, constipation, heartburn        HPI:    Chief Complaint  Patient presents with  . New Patient (Initial Visit)  . Abdominal Pain    Patient states she has a sharp and burning epigastric pain. Patient has had nausea and vomiting. She has had constipation     Shirley [name] is a 43 y.o. y/o female referred for consultation & management  by Dr. Theodoro Doing[facility].  Patient went to the ER with nausea, and heartburn.  Was given PPI and sucralfate which has helped her symptoms.  Reports previous history of similar symptoms and was given similar medications which helped her symptoms but since she ran out of the medication symptoms returned.  Also reports dysphagia ongoing for the last 3 to 4 months, intermittently.  Dysphagia to solids and mostly meats.  Has been avoiding these.  Chews it well.  No episodes of food impaction.  Had an upper endoscopy in August 2019 for similar symptoms and this showed gastric erythema and erosions.  Duodenal bulb congestion was also reported.  Biopsies did not show H. pylori.  Villous blunting was seen on duodenal biopsies and TTG IgA and total IgA were normal at the time.  Patient is not having any diarrhea.  No weight loss.  Reports family history of colon cancer in father and reports that she had a colonoscopy about 2 years ago that was normal and repeat was recommended in 5 years.  We do not have these records.  Past Medical History:  Diagnosis Date  . Anemia   . Bacteremia   . Blood dyscrasia    has to apply a lot of pressure to stop bleeding  . GERD (gastroesophageal reflux disease)   . Heart murmur    with pregnancy  . History of  hiatal hernia   . Hypertension    4-5 years ago, no meds  . Kidney infection    patient states I might have a kidney infection  . Migraine    migraines  . Pyelonephritis     Past Surgical History:  Procedure Laterality Date  . ABDOMINAL HERNIA REPAIR    . CESAREAN SECTION     x 4  . CESAREAN SECTION     x 4  . ENDOMETRIAL ABLATION  07/31/2016  . HYSTEROSCOPY  07/31/2016   Procedure: HYSTEROSCOPY WITH HYDROTHERMAL ABLATION;  Surgeon: [place] Bingharlie Pickens, MD;  Location: [facility];  Service: Gynecology;;    Prior to Admission medications   Medication Sig Start Date End Date Taking? Authorizing Provider  acetaminophen (TYLENOL) 500 MG tablet Take 1,000 mg by mouth every 6 (six) hours as needed for mild pain or headache.   Yes [provider]  albuterol (PROVENTIL HFA;VENTOLIN HFA) 108 (90 Base) MCG/ACT inhaler Inhale 2 puffs into the lungs every 6 (six) hours as needed for wheezing or shortness of breath.   Yes [provider]  amLODipine (NORVASC) 2.5 MG tablet Take 2.5 mg by mouth daily. 01/08/18  Yes [provider]  metoCLOPramide (REGLAN) 5 MG tablet Take 1 tablet (5 mg total) by mouth every 6 (six) hours as needed for nausea. 06/03/18  Yes Danis, Andreas BlowerHenry L III,  MD  Multiple Vitamins-Minerals (WOMENS DAILY FORMULA PO) Take 1 tablet by mouth daily.   Yes [provider]  omeprazole (PRILOSEC) 20 MG capsule Take 20 mg by mouth 2 (two) times daily before a meal.   Yes [provider]  pantoprazole (PROTONIX) 40 MG tablet Take 1 tablet (40 mg total) by mouth daily. 07/08/19 08/07/19 Yes Arta Silence, MD  polyethylene glycol (MIRALAX / GLYCOLAX) packet Take 17 g by mouth daily as needed for mild constipation.    Yes [provider]  sucralfate (CARAFATE) 1 g tablet Take 1 tablet (1 g total) by mouth 4 (four) times daily for 15 days. 07/08/19 07/23/19 Yes Arta Silence, MD  bisacodyl (BISACODYL) 5 MG EC tablet Take 2 tablets (10mg ) by  mouth the day before your procedure between 1pm-3pm. 07/21/19   Virgel Manifold, MD  Na Sulfate-K Sulfate-Mg Sulf 17.5-3.13-1.6 GM/177ML SOLN Take 354 mLs by mouth once for 1 dose. 07/21/19 07/21/19  Virgel Manifold, MD    Family History  Problem Relation Age of Onset  . Hypertension Mother   . Colon cancer Father 70  . Stroke Brother      Social History   Tobacco Use  . Smoking status: Current Every Day Smoker    Packs/day: 0.50    Types: Cigarettes  . Smokeless tobacco: Never Used  Substance Use Topics  . Alcohol use: Yes    Comment: occasionally  . Drug use: Yes    Types: Marijuana    Allergies as of 07/21/2019 - Review Complete 07/21/2019  Allergen Reaction Noted  . Lisinopril Anaphylaxis and Swelling 05/09/2017  . Aspirin Nausea Only 11/17/2009  . Ibuprofen Nausea Only 11/17/2009  . Other Nausea Only and Other (See Comments) 09/10/2018    Review of Systems:    All systems reviewed and negative except where noted in HPI.   Physical Exam:  BP (!) 148/100 (BP Location: Left Arm, Patient Position: Sitting, Cuff Size: Normal)   Pulse 93   Temp 98.2 F (36.8 C) (Oral)   Ht 5\' 2"  (1.575 m)   Wt 120 lb 6 oz (54.6 kg)   BMI 22.02 kg/m  No LMP recorded. Patient has had an ablation. Psych:  Alert and cooperative. Normal mood and affect. General:   Alert,  Well-developed, well-nourished, pleasant and cooperative in NAD Head:  Normocephalic and atraumatic. Eyes:  Sclera clear, no icterus.   Conjunctiva pink. Ears:  Normal auditory acuity. Nose:  No deformity, discharge, or lesions. Mouth:  No deformity or lesions,oropharynx pink & moist. Neck:  Supple; no masses or thyromegaly. Abdomen:  Normal bowel sounds.  No bruits.  Soft, non-tender and non-distended without masses, hepatosplenomegaly or hernias noted.  No guarding or rebound tenderness.    Msk:  Symmetrical without gross deformities. Good, equal movement & strength bilaterally. Pulses:  Normal pulses  noted. Extremities:  No clubbing or edema.  No cyanosis. Neurologic:  Alert and oriented x3;  grossly normal neurologically. Skin:  Intact without significant lesions or rashes. No jaundice. Lymph Nodes:  No significant cervical adenopathy. Psych:  Alert and cooperative. Normal mood and affect.   Labs: CBC    Component Value Date/Time   WBC 6.1 07/08/2019 1851   RBC 3.59 (L) 07/08/2019 1851   HGB 9.9 (L) 07/08/2019 1851   HCT 31.1 (L) 07/08/2019 1851   PLT 402 (H) 07/08/2019 1851   MCV 86.6 07/08/2019 1851   MCH 27.6 07/08/2019 1851   MCHC 31.8 07/08/2019 1851   RDW 14.5 07/08/2019 1851  LYMPHSABS 2.1 06/17/2018 1736   MONOABS 0.3 06/17/2018 1736   EOSABS 0.5 06/17/2018 1736   BASOSABS 0.0 06/17/2018 1736   CMP     Component Value Date/Time   NA 136 07/08/2019 1851   K 2.8 (L) 07/08/2019 1851   CL 102 07/08/2019 1851   CO2 21 (L) 07/08/2019 1851   GLUCOSE 105 (H) 07/08/2019 1851   BUN 18 07/08/2019 1851   CREATININE 0.71 07/08/2019 1851   CALCIUM 9.9 07/08/2019 1851   PROT 8.5 (H) 07/08/2019 1851   ALBUMIN 4.9 07/08/2019 1851   AST 18 07/08/2019 1851   ALT 15 07/08/2019 1851   ALKPHOS 41 07/08/2019 1851   BILITOT 0.5 07/08/2019 1851   GFRNONAA >60 07/08/2019 1851   GFRAA >60 07/08/2019 1851    Imaging Studies: Ct Abdomen Pelvis W Contrast  Result Date: 07/08/2019 CLINICAL DATA:  Acute generalized abdominal pain for 3 days, constipation for 2 days EXAM: CT ABDOMEN AND PELVIS WITH CONTRAST TECHNIQUE: Multidetector CT imaging of the abdomen and pelvis was performed using the standard protocol following bolus administration of intravenous contrast. CONTRAST:  OMNIPAQUE IOHEXOL 300 MG/ML  SOLN COMPARISON:  CT abdomen pelvis 03/11/2019 FINDINGS: Lower chest: Lung bases are clear. Normal heart size. No pericardial effusion. Hepatobiliary: No focal liver abnormality is seen. No calcified gallstones, pericholecystic inflammation or gallbladder wall thickening.  Borderline dilation of the common bile duct (8 mm) is nonspecific particularly given stability from comparison exam. No visible intraductal gallstones. Pancreas: Unremarkable. No pancreatic ductal dilatation or surrounding inflammatory changes. Spleen: Normal in size without focal abnormality. Adrenals/Urinary Tract: Normal adrenal glands. Subcentimeter hypoattenuating focus in the interpolar left kidney, too small to fully characterize on CT imaging but statistically likely benign. Kidneys are otherwise unremarkable, without renal calculi, suspicious lesion, or hydronephrosis. Mild bladder wall thickening. Stomach/Bowel: Distal esophagus, stomach and duodenal sweep are unremarkable. No small bowel wall thickening or dilatation. No evidence of obstruction. A normal appendix is visualized. There is segmental thickening of the sigmoid colon which is similar to comparison exam. Remaining portions of the colon are free from colonic dilatation or wall thickening. Vascular/Lymphatic: Atherosclerotic plaque within the normal caliber aorta. Bilateral parametrial venous engorgement. Evaluation of the lymph nodes is limited given a paucity of intraperitoneal fat. No pathologically enlarged nodes are clearly identified. Reproductive: Retroflexed uterus appears adherent to the anterior abdominal wall, likely reflecting postsurgical change from multiple prior Caesarean sections. There is a typical enhancement of the uterine fundus with a small amount of fluid or thickening of the fundal endometrium which is nonspecific on this CT exam. No concerning adnexal lesions. Metallic clip in the pelvis may reflect prior ligation. Other: Paucity of intraperitoneal fat. No free fluid or free air. Small stable ventral hernia of the midline rectus sheath (2/28, 6/51) unchanged from priors. Postsurgical changes of the low anterior abdominal wall. Musculoskeletal: Multilevel degenerative changes are present in the imaged portions of the spine.  No acute osseous abnormality or suspicious osseous lesion. IMPRESSION: 1. Mild segmental thickening of the sigmoid colon. Could reflect underdistention or sequela of chronic inflammation. Consider outpatient correlation with direct visualization. 2. Retroflexed uterus appears adherent to the anterior abdominal wall, likely reflecting postsurgical change from multiple prior Caesarean sections. There is a small amount of fluid or thickening of the fundal endometrium which is nonspecific on this CT exam. Consider further evaluation with pelvic ultrasound as clinically indicated. 3. Bilateral parametrial venous engorgement, which can be reactive to the above process or seen with pelvic congestion syndrome  in the appropriate clinical setting. 4. Mild bladder wall thickening. Correlate with urinalysis to exclude cystitis. 5. Unchanged fat containing ventral hernia of the midline rectus sheath. 6. Aortic Atherosclerosis (ICD10-I70.0). Electronically Signed   By: Kreg Shropshire M.D.   On: 07/08/2019 22:31   US Pelvic Complete With Transvaginal  Result Date: 07/09/2019 CLINICAL DATA:  43 year old female with acute generalized abdominal pain x3 days. History of endometrial ablation and multiple C-sections. EXAM: TRANSABDOMINAL AND TRANSVAGINAL ULTRASOUND OF PELVIS TECHNIQUE: Both transabdominal and transvaginal ultrasound examinations of the pelvis were performed. Transabdominal technique was performed for global imaging of the pelvis including uterus, ovaries, adnexal regions, and pelvic cul-de-sac. It was necessary to proceed with endovaginal exam following the transabdominal exam to visualize the endometrium and ovaries. COMPARISON:  CT of the abdomen pelvis dated 07/08/2019 and pelvic ultrasound dated 05/05/2018. FINDINGS: Uterus Measurements: 7.3 x 3.9 x 4.4 cm = volume: 66 mL. The uterus is retroflexed otherwise unremarkable. Endometrium Thickness: 7 mm. Trace fluid noted in the lower endometrium. No abnormal  endometrial vascularity identified. Right ovary Measurements: 2.9 x 1.1 x 1.2 cm = volume: 1.9 mL. Normal appearance/no adnexal mass. Left ovary Measurements: 2.4 x 1.6 x 2.3 cm = volume: 4.6 mL. Normal appearance/no adnexal mass. Other findings There is a crescentic echogenic structure in the region of the left adnexa (image 80/95) with clear posterior shadowing. No corresponding structure noted on the earlier CT. IMPRESSION: 1. Retroflexed uterus. 2. Unremarkable endometrium and ovaries. Electronically Signed   By: Elgie Collard M.D.   On: 07/09/2019 01:49    Assessment and Plan:   Shirley [name] is a 43 y.o. y/o female has been referred for nausea, constipation, dysphagia  The fact that her symptoms are better with PPI is reassuring and symptoms are most likely related to reflux  however, her dysphagia is new and needs to be further evaluated with EGD and biopsies, and possible dilation  In addition, since villous blunting was seen on her last duodenal biopsies, this will also need need to be repeated  Continue the PPI once daily that was prescribed by the ER  Patient is not taking any NSAIDs and was encouraged to continue to avoid these  Her CT scan on October 28 during the ER visit reported sigmoid thickening which is new.  Direct visualization was recommended.  Since it has been over 2 years since her last colonoscopy, we discussed repeating her colonoscopy at this time to reevaluate this area.  In addition, she is anemic and does not get periods and does not have any episodes of bleeding.  Will obtain ferritin and iron panel to evaluate for iron deficiency anemia.  We will also refer to hematology for work-up for anemia  I have discussed alternative options, risks & benefits,  which include, but are not limited to, bleeding, infection, perforation,respiratory complication & drug reaction.  The patient agrees with this plan & written consent will be obtained.    High-fiber diet  MiraLAX daily with goal of 1-2 soft bowel movements daily.  If not at goal, patient instructed to increase dose to twice daily.  If loose stools with the medication, patient asked to decrease the medication to every other day, or half dose daily.  Patient verbalized understanding   Dr Shirley [name]  Speech recognition software was used to dictate the above note.

## 2019-07-21 NOTE — Patient Instructions (Signed)
Please take Mirlax daily  Please continue medications  High-Fiber Diet Fiber, also called dietary fiber, is a type of carbohydrate that is found in fruits, vegetables, whole grains, and beans. A high-fiber diet can have many health benefits. Your health care provider may recommend a high-fiber diet to help:  Prevent constipation. Fiber can make your bowel movements more regular.  Lower your cholesterol.  Relieve the following conditions: ? Swelling of veins in the anus (hemorrhoids). ? Swelling and irritation (inflammation) of specific areas of the digestive tract (uncomplicated diverticulosis). ? A problem of the large intestine (colon) that sometimes causes pain and diarrhea (irritable bowel syndrome, IBS).  Prevent overeating as part of a weight-loss plan.  Prevent heart disease, type 2 diabetes, and certain cancers. What is my plan? The recommended daily fiber intake in grams (g) includes:  38 g for men age 47 or younger.  30 g for men over age 1.  25 g for women age 3 or younger.  21 g for women over age 75. You can get the recommended daily intake of dietary fiber by:  Eating a variety of fruits, vegetables, grains, and beans.  Taking a fiber supplement, if it is not possible to get enough fiber through your diet. What do I need to know about a high-fiber diet?  It is better to get fiber through food sources rather than from fiber supplements. There is not a lot of research about how effective supplements are.  Always check the fiber content on the nutrition facts label of any prepackaged food. Look for foods that contain 5 g of fiber or more per serving.  Talk with a diet and nutrition specialist (dietitian) if you have questions about specific foods that are recommended or not recommended for your medical condition, especially if those foods are not listed below.  Gradually increase how much fiber you consume. If you increase your intake of dietary fiber too quickly,  you may have bloating, cramping, or gas.  Drink plenty of water. Water helps you to digest fiber. What are tips for following this plan?  Eat a wide variety of high-fiber foods.  Make sure that half of the grains that you eat each day are whole grains.  Eat breads and cereals that are made with whole-grain flour instead of refined flour or white flour.  Eat brown rice, bulgur wheat, or millet instead of white rice.  Start the day with a breakfast that is high in fiber, such as a cereal that contains 5 g of fiber or more per serving.  Use beans in place of meat in soups, salads, and pasta dishes.  Eat high-fiber snacks, such as berries, raw vegetables, nuts, and popcorn.  Choose whole fruits and vegetables instead of processed forms like juice or sauce. What foods can I eat?  Fruits Berries. Pears. Apples. Oranges. Avocado. Prunes and raisins. Dried figs. Vegetables Sweet potatoes. Spinach. Kale. Artichokes. Cabbage. Broccoli. Cauliflower. Green peas. Carrots. Squash. Grains Whole-grain breads. Multigrain cereal. Oats and oatmeal. Brown rice. Barley. Bulgur wheat. Millet. Quinoa. Bran muffins. Popcorn. Rye wafer crackers. Meats and other proteins Navy, kidney, and pinto beans. Soybeans. Split peas. Lentils. Nuts and seeds. Dairy Fiber-fortified yogurt. Beverages Fiber-fortified soy milk. Fiber-fortified orange juice. Other foods Fiber bars. The items listed above may not be a complete list of recommended foods and beverages. Contact a dietitian for more options. What foods are not recommended? Fruits Fruit juice. Cooked, strained fruit. Vegetables Fried potatoes. Canned vegetables. Well-cooked vegetables. Grains White bread. Pasta  made with refined flour. White rice. Meats and other proteins Fatty cuts of meat. Fried chicken or fried fish. Dairy Milk. Yogurt. Cream cheese. Sour cream. Fats and oils Butters. Beverages Soft drinks. Other foods Cakes and pastries.  The items listed above may not be a complete list of foods and beverages to avoid. Contact a dietitian for more information. Summary  Fiber is a type of carbohydrate. It is found in fruits, vegetables, whole grains, and beans.  There are many health benefits of eating a high-fiber diet, such as preventing constipation, lowering blood cholesterol, helping with weight loss, and reducing your risk of heart disease, diabetes, and certain cancers.  Gradually increase your intake of fiber. Increasing too fast can result in cramping, bloating, and gas. Drink plenty of water while you increase your fiber.  The best sources of fiber include whole fruits and vegetables, whole grains, nuts, seeds, and beans. This information is not intended to replace advice given to you by your health care provider. Make sure you discuss any questions you have with your health care provider. Document Released: 08/27/2005 Document Revised: 07/01/2017 Document Reviewed: 07/01/2017 Elsevier Patient Education  2020 [employer].

## 2019-07-21 NOTE — Telephone Encounter (Signed)
Talk to [facility] gastroenterology. They state the only thing she has had was a EGD no colonoscopy has been done.

## 2019-07-22 ENCOUNTER — Telehealth: Payer: Self-pay

## 2019-07-22 LAB — IRON AND TIBC
Iron Saturation: 10 % — ABNORMAL LOW (ref 15–55)
Iron: 33 ug/dL (ref 27–159)
Total Iron Binding Capacity: 332 ug/dL (ref 250–450)
UIBC: 299 ug/dL (ref 131–425)

## 2019-07-22 LAB — FERRITIN: Ferritin: 45 ng/mL (ref 15–150)

## 2019-07-22 NOTE — Telephone Encounter (Signed)
Patient verbalized understanding. Patient will make appointment with Hematology when they call.

## 2019-07-22 NOTE — Telephone Encounter (Signed)
-----   Message from Virgel Manifold, MD sent at 07/22/2019  7:35 AM EST ----- Caryl Pina please let the patient know, her iron saturation Is low and she should follow up with hematology to discuss Iron replacement. She will need both EGD and colonoscopy as scheduled

## 2019-07-23 NOTE — Discharge Instructions (Signed)

## 2019-07-24 ENCOUNTER — Encounter
Admission: RE | Disposition: A | Discharge status: Home / Self Care | Payer: Self-pay | Source: Home / Self Care | Attending: Gastroenterology

## 2019-07-24 ENCOUNTER — Other Ambulatory Visit: Payer: Self-pay

## 2019-07-24 ENCOUNTER — Ambulatory Visit: Payer: BC Managed Care – PPO | Admitting: Anesthesiology

## 2019-07-24 ENCOUNTER — Ambulatory Visit
Admission: RE | Admit: 2019-07-24 | Discharge: 2019-07-24 | Disposition: A | Discharge status: Home / Self Care | Payer: BC Managed Care – PPO | Attending: Gastroenterology | Admitting: Gastroenterology

## 2019-07-24 DIAGNOSIS — Z7982 Long term (current) use of aspirin: Secondary | ICD-10-CM | POA: Diagnosis not present

## 2019-07-24 DIAGNOSIS — K253 Acute gastric ulcer without hemorrhage or perforation: Secondary | ICD-10-CM | POA: Diagnosis not present

## 2019-07-24 DIAGNOSIS — F17200 Nicotine dependence, unspecified, uncomplicated: Secondary | ICD-10-CM | POA: Diagnosis not present

## 2019-07-24 DIAGNOSIS — K449 Diaphragmatic hernia without obstruction or gangrene: Secondary | ICD-10-CM | POA: Insufficient documentation

## 2019-07-24 DIAGNOSIS — Z886 Allergy status to analgesic agent status: Secondary | ICD-10-CM | POA: Diagnosis not present

## 2019-07-24 DIAGNOSIS — R933 Abnormal findings on diagnostic imaging of other parts of digestive tract: Secondary | ICD-10-CM

## 2019-07-24 DIAGNOSIS — Z791 Long term (current) use of non-steroidal anti-inflammatories (NSAID): Secondary | ICD-10-CM | POA: Diagnosis not present

## 2019-07-24 DIAGNOSIS — Z7951 Long term (current) use of inhaled steroids: Secondary | ICD-10-CM | POA: Diagnosis not present

## 2019-07-24 DIAGNOSIS — Z8249 Family history of ischemic heart disease and other diseases of the circulatory system: Secondary | ICD-10-CM | POA: Diagnosis not present

## 2019-07-24 DIAGNOSIS — D649 Anemia, unspecified: Secondary | ICD-10-CM | POA: Diagnosis not present

## 2019-07-24 DIAGNOSIS — K319 Disease of stomach and duodenum, unspecified: Secondary | ICD-10-CM | POA: Diagnosis not present

## 2019-07-24 DIAGNOSIS — R1319 Other dysphagia: Secondary | ICD-10-CM | POA: Diagnosis not present

## 2019-07-24 DIAGNOSIS — F17210 Nicotine dependence, cigarettes, uncomplicated: Secondary | ICD-10-CM | POA: Diagnosis not present

## 2019-07-24 DIAGNOSIS — R110 Nausea: Secondary | ICD-10-CM | POA: Diagnosis not present

## 2019-07-24 DIAGNOSIS — I10 Essential (primary) hypertension: Secondary | ICD-10-CM | POA: Insufficient documentation

## 2019-07-24 DIAGNOSIS — K259 Gastric ulcer, unspecified as acute or chronic, without hemorrhage or perforation: Secondary | ICD-10-CM | POA: Insufficient documentation

## 2019-07-24 DIAGNOSIS — Z823 Family history of stroke: Secondary | ICD-10-CM | POA: Diagnosis not present

## 2019-07-24 DIAGNOSIS — Z800 Family history of malignant neoplasm of digestive organs: Secondary | ICD-10-CM | POA: Insufficient documentation

## 2019-07-24 DIAGNOSIS — R948 Abnormal results of function studies of other organs and systems: Secondary | ICD-10-CM | POA: Diagnosis present

## 2019-07-24 DIAGNOSIS — G43909 Migraine, unspecified, not intractable, without status migrainosus: Secondary | ICD-10-CM | POA: Insufficient documentation

## 2019-07-24 DIAGNOSIS — K219 Gastro-esophageal reflux disease without esophagitis: Secondary | ICD-10-CM | POA: Diagnosis not present

## 2019-07-24 DIAGNOSIS — K228 Other specified diseases of esophagus: Secondary | ICD-10-CM | POA: Diagnosis not present

## 2019-07-24 DIAGNOSIS — Z888 Allergy status to other drugs, medicaments and biological substances status: Secondary | ICD-10-CM | POA: Diagnosis not present

## 2019-07-24 DIAGNOSIS — R1310 Dysphagia, unspecified: Secondary | ICD-10-CM | POA: Diagnosis not present

## 2019-07-24 HISTORY — PX: ESOPHAGOGASTRODUODENOSCOPY (EGD) WITH PROPOFOL: SHX5813

## 2019-07-24 HISTORY — DX: Presence of dental prosthetic device (complete) (partial): Z97.2

## 2019-07-24 HISTORY — PX: COLONOSCOPY WITH PROPOFOL: SHX5780

## 2019-07-24 LAB — POTASSIUM: Potassium: 3.1 mmol/L — ABNORMAL LOW (ref 3.5–5.1)

## 2019-07-24 LAB — POCT PREGNANCY, URINE: Preg Test, Ur: NEGATIVE

## 2019-07-24 SURGERY — ESOPHAGOGASTRODUODENOSCOPY (EGD) WITH PROPOFOL
Anesthesia: General

## 2019-07-24 MED ORDER — ACETAMINOPHEN 160 MG/5ML PO SOLN: 325.0000 mg | Freq: Once | ORAL | Status: DC

## 2019-07-24 MED ORDER — GLYCOPYRROLATE 0.2 MG/ML IJ SOLN
INTRAMUSCULAR | Status: DC | PRN
  Administered 2019-07-24: 0.1 mg via INTRAVENOUS

## 2019-07-24 MED ORDER — ACETAMINOPHEN 325 MG PO TABS: 325.0000 mg | ORAL_TABLET | Freq: Once | ORAL | Status: DC

## 2019-07-24 MED ORDER — LACTATED RINGERS IV SOLN: 10.0000 mL/h | INTRAVENOUS | Status: DC

## 2019-07-24 MED ORDER — LACTATED RINGERS IV SOLN
INTRAVENOUS | Status: DC
  Administered 2019-07-24 (×2): via INTRAVENOUS

## 2019-07-24 MED ORDER — PROPOFOL 10 MG/ML IV BOLUS
INTRAVENOUS | Status: DC | PRN
  Administered 2019-07-24: 40 mg via INTRAVENOUS
  Administered 2019-07-24: 50 mg via INTRAVENOUS
  Administered 2019-07-24: 40 mg via INTRAVENOUS
  Administered 2019-07-24: 30 mg via INTRAVENOUS
  Administered 2019-07-24: 40 mg via INTRAVENOUS
  Administered 2019-07-24: 30 mg via INTRAVENOUS
  Administered 2019-07-24 (×3): 40 mg via INTRAVENOUS
  Administered 2019-07-24: 50 mg via INTRAVENOUS
  Administered 2019-07-24 (×5): 40 mg via INTRAVENOUS
  Administered 2019-07-24: 150 mg via INTRAVENOUS
  Administered 2019-07-24: 40 mg via INTRAVENOUS
  Administered 2019-07-24: 30 mg via INTRAVENOUS

## 2019-07-24 MED ORDER — OMEPRAZOLE 20 MG PO CPDR: 40.0000 mg | DELAYED_RELEASE_CAPSULE | Freq: Two times a day (BID) | ORAL | 0 refills | Status: DC

## 2019-07-24 MED ORDER — LIDOCAINE HCL (CARDIAC) PF 100 MG/5ML IV SOSY
PREFILLED_SYRINGE | INTRAVENOUS | Status: DC | PRN
  Administered 2019-07-24: 30 mg via INTRAVENOUS

## 2019-07-24 MED ORDER — STERILE WATER FOR IRRIGATION IR SOLN
Status: DC | PRN
  Administered 2019-07-24: 11:00:00

## 2019-07-24 SURGICAL SUPPLY — 36 items
BALLN DILATOR 10-12 8 (BALLOONS)
BALLN DILATOR 12-15 8 (BALLOONS)
BALLN DILATOR 15-18 8 (BALLOONS)
BALLN DILATOR CRE 0-12 8 (BALLOONS)
BALLN DILATOR ESOPH 8 10 CRE (MISCELLANEOUS) IMPLANT
BALLOON DILATOR 12-15 8 (BALLOONS) IMPLANT
BALLOON DILATOR 15-18 8 (BALLOONS) IMPLANT
BALLOON DILATOR CRE 0-12 8 (BALLOONS) IMPLANT
BLOCK BITE 60FR ADLT L/F GRN (MISCELLANEOUS) ×2 IMPLANT
CANISTER SUCT 1200ML W/VALVE (MISCELLANEOUS) ×2 IMPLANT
CLIP HMST 235XBRD CATH ROT (MISCELLANEOUS) IMPLANT
CLIP RESOLUTION 360 11X235 (MISCELLANEOUS)
ELECT REM PT RETURN 9FT ADLT (ELECTROSURGICAL)
ELECTRODE REM PT RTRN 9FT ADLT (ELECTROSURGICAL) IMPLANT
FCP ESCP3.2XJMB 240X2.8X (MISCELLANEOUS)
FORCEPS BIOP RAD 4 LRG CAP 4 (CUTTING FORCEPS) ×1 IMPLANT
FORCEPS BIOP RJ4 240 W/NDL (MISCELLANEOUS)
FORCEPS ESCP3.2XJMB 240X2.8X (MISCELLANEOUS) IMPLANT
GOWN CVR UNV OPN BCK APRN NK (MISCELLANEOUS) ×2 IMPLANT
GOWN ISOL THUMB LOOP REG UNIV (MISCELLANEOUS) ×4
INJECTOR VARIJECT VIN23 (MISCELLANEOUS) IMPLANT
KIT DEFENDO VALVE AND CONN (KITS) IMPLANT
KIT ENDO PROCEDURE OLY (KITS) ×2 IMPLANT
MARKER SPOT ENDO TATTOO 5ML (MISCELLANEOUS) IMPLANT
PROBE APC STR FIRE (PROBE) IMPLANT
RETRIEVER NET PLAT FOOD (MISCELLANEOUS) IMPLANT
RETRIEVER NET ROTH 2.5X230 LF (MISCELLANEOUS) IMPLANT
SNARE SHORT THROW 13M SML OVAL (MISCELLANEOUS) IMPLANT
SNARE SHORT THROW 30M LRG OVAL (MISCELLANEOUS) IMPLANT
SNARE SNG USE RND 15MM (INSTRUMENTS) IMPLANT
SPOT EX ENDOSCOPIC TATTOO (MISCELLANEOUS)
SYR INFLATION 60ML (SYRINGE) IMPLANT
TRAP ETRAP POLY (MISCELLANEOUS) IMPLANT
VARIJECT INJECTOR VIN23 (MISCELLANEOUS)
WATER STERILE IRR 250ML POUR (IV SOLUTION) ×2 IMPLANT
WIRE CRE 18-20MM 8CM F G (MISCELLANEOUS) IMPLANT

## 2019-07-24 NOTE — Anesthesia Procedure Notes (Signed)
Date/Time: 07/24/2019 10:41 AM Performed by: Cameron Ali, CRNA Pre-anesthesia Checklist: Patient identified, Emergency Drugs available, Suction available, Timeout performed and Patient being monitored Patient Re-evaluated:Patient Re-evaluated prior to induction Oxygen Delivery Method: Nasal cannula Placement Confirmation: positive ETCO2

## 2019-07-24 NOTE — H&P (Addendum)
[name], MD [address] [place], [place], [place], [address] [address], [place], [place], [place], [address] Phone: [phone]  Fax: [phone]  Primary Care Physician:  [facility]   Pre-Procedure History & Physical: HPI:  Shirley [name] is a 43 y.o. female is here for a colonoscopy and EGD.   Past Medical History:  Diagnosis Date  . Anemia   . Bacteremia   . Blood dyscrasia    has to apply a lot of pressure to stop bleeding  . GERD (gastroesophageal reflux disease)   . Heart murmur    with pregnancy  . History of hiatal hernia   . Hypertension    4-5 years ago, no meds  . Kidney infection    patient states I might have a kidney infection  . Migraine    migraines 1-2x/month  . Pyelonephritis   . Seizure ([place]) 05/09/2017   due to low potassium  . Wears dentures    partial upper and lower    Past Surgical History:  Procedure Laterality Date  . ABDOMINAL HERNIA REPAIR    . CESAREAN SECTION     x 4  . CESAREAN SECTION     x 4  . ENDOMETRIAL ABLATION  07/31/2016  . HYSTEROSCOPY  07/31/2016   Procedure: HYSTEROSCOPY WITH HYDROTHERMAL ABLATION;  Surgeon: Aletha Halim, MD;  Location: [facility];  Service: Gynecology;;    Prior to Admission medications   Medication Sig Start Date End Date Taking? Authorizing Provider  acetaminophen (TYLENOL) 500 MG tablet Take 1,000 mg by mouth every 6 (six) hours as needed for mild pain or headache.   Yes [provider]  albuterol (PROVENTIL HFA;VENTOLIN HFA) 108 (90 Base) MCG/ACT inhaler Inhale 2 puffs into the lungs every 6 (six) hours as needed for wheezing or shortness of breath.   Yes [provider]  amLODipine (NORVASC) 2.5 MG tablet Take 2.5 mg by mouth daily. 01/08/18  Yes [provider]  Ascorbic Acid (VITAMIN C PO) Take by mouth daily.   Yes [provider]  bisacodyl (BISACODYL) 5 MG EC tablet Take 2 tablets (10mg ) by mouth the day before  your procedure between 1pm-3pm. 07/21/19  Yes Kynzley Dowson, Lennette Bihari, MD  metoCLOPramide (REGLAN) 5 MG tablet Take 1 tablet (5 mg total) by mouth every 6 (six) hours as needed for nausea. 06/03/18  Yes Danis, Kirke Corin, MD  Multiple Vitamins-Minerals (WOMENS DAILY FORMULA PO) Take 1 tablet by mouth daily.   Yes [provider]  pantoprazole (PROTONIX) 40 MG tablet Take 1 tablet (40 mg total) by mouth daily. 07/08/19 08/07/19 Yes Arta Silence, MD  polyethylene glycol (MIRALAX / GLYCOLAX) packet Take 17 g by mouth daily as needed for mild constipation.    Yes [provider]  sucralfate (CARAFATE) 1 g tablet Take 1 tablet (1 g total) by mouth 4 (four) times daily for 15 days. 07/08/19 07/23/19 Yes Arta Silence, MD  omeprazole (PRILOSEC) 20 MG capsule Take 20 mg by mouth 2 (two) times daily before a meal.    [provider]    Allergies as of 07/21/2019 - Review Complete 07/21/2019  Allergen Reaction Noted  . Lisinopril Anaphylaxis and Swelling 05/09/2017  . Aspirin Nausea Only 11/17/2009  . Ibuprofen Nausea Only 11/17/2009  . Other Nausea Only and Other (See Comments) 09/10/2018    Family History  Problem Relation Age of Onset  . Hypertension Mother   . Colon cancer Father 32  . Stroke Brother  Social History   Socioeconomic History  . Marital status: Married    Spouse name: Not on file  . Number of children: 4  . Years of education: Not on file  . Highest education level: Not on file  Occupational History  . Not on file  Social Needs  . Financial resource strain: Not on file  . Food insecurity    Worry: Not on file    Inability: Not on file  . Transportation needs    Medical: Not on file    Non-medical: Not on file  Tobacco Use  . Smoking status: Current Every Day Smoker    Packs/day: 0.50    Types: Cigarettes  . Smokeless tobacco: Never Used  . Tobacco comment: 1 black and mild/day  Substance and Sexual Activity  . Alcohol  use: Yes    Comment: occasionally  . Drug use: Yes    Types: Marijuana    Comment: last  time: 07/18/19  . Sexual activity: Yes    Birth control/protection: None  Lifestyle  . Physical activity    Days per week: Not on file    Minutes per session: Not on file  . Stress: Not on file  Relationships  . Social Musician on phone: Not on file    Gets together: Not on file    Attends religious service: Not on file    Active member of club or organization: Not on file    Attends meetings of clubs or organizations: Not on file    Relationship status: Not on file  . Intimate partner violence    Fear of current or ex partner: Not on file    Emotionally abused: Not on file    Physically abused: Not on file    Forced sexual activity: Not on file  Other Topics Concern  . Not on file  Social History Narrative  . Not on file    Review of Systems: See HPI, otherwise negative ROS  Physical Exam: Ht 5\' 2"  (1.575 m)   Wt 54.4 kg   BMI 21.95 kg/m  General:   Alert,  pleasant and cooperative in NAD Head:  Normocephalic and atraumatic. Neck:  Supple; no masses or thyromegaly. Lungs:  Clear throughout to auscultation, normal respiratory effort.    Heart:  +S1, +S2, Regular rate and rhythm, No edema. Abdomen:  Soft, nontender and nondistended. Normal bowel sounds, without guarding, and without rebound.   Neurologic:  Alert and  oriented x4;  grossly normal neurologically.  Impression/Plan: is here for a colonoscopy to be performed for abnormal CT of the sigmoid colon and EGD for nausea, dysphagia, villous blunting noted on previous biopsies.   Risks, benefits, limitations, and alternatives regarding the procedures have been reviewed with the patient.  Questions have been answered.  All parties agreeable.  Pt and family (christopher) informed to follow up with PCP in regard to patient's potassium levels and replacement. They verbalized understanding and will follow  up this week or next week.    Erick Alley, MD  07/24/2019, 9:03 AM

## 2019-07-24 NOTE — Op Note (Signed)
[facility] Patient Name: Shirley [name] Procedure Date: 07/24/2019 10:42 AM MRN: [id number] Account #: [id number] Date of Birth: Jul 23, 1976 Admit Type: Outpatient Age: 43 Room: [facility] OR ROOM 01 Gender: Female Note Status: Finalized Procedure:             Colonoscopy Indications:           Abnormal CT of the GI tract Providers:             Khamille Beynon B. Maximino Greenland MD, MD Medicines:             Monitored Anesthesia Care Complications:         No immediate complications. Procedure:             Pre-Anesthesia Assessment:                        - Prior to the procedure, a History and Physical was                         performed, and patient medications, allergies and                         sensitivities were reviewed. The patient's tolerance                         of previous anesthesia was reviewed.                        - The risks and benefits of the procedure and the                         sedation options and risks were discussed with the                         patient. All questions were answered and informed                         consent was obtained.                        - Patient identification and proposed procedure were                         verified prior to the procedure by the physician, the                         nurse, the anesthetist and the technician. The                         procedure was verified in the pre-procedure area in                         the procedure room in the endoscopy suite.                        - ASA Grade Assessment: II - A patient with mild                         systemic disease.                        -  After reviewing the risks and benefits, the patient                         was deemed in satisfactory condition to undergo the                         procedure.                        After obtaining informed consent, the colonoscope was                         passed under direct vision.  Throughout the procedure,                         the patient's blood pressure, pulse, and oxygen                         saturations were monitored continuously. The was                         introduced through the anus and advanced to the the                         cecum, identified by appendiceal orifice and ileocecal                         valve. The colonoscopy was performed with ease. The                         patient tolerated the procedure well. The quality of                         the bowel preparation was good. Findings:      The perianal and digital rectal examinations were normal.      The rectum, sigmoid colon, descending colon, transverse colon, ascending       colon and cecum appeared normal.      The retroflexed view of the distal rectum and anal verge was normal and       showed no anal or rectal abnormalities. Impression:            - The rectum, sigmoid colon, descending colon,                         transverse colon, ascending colon and cecum are normal.                        - The distal rectum and anal verge are normal on                         retroflexion view.                        - No specimens collected. Recommendation:        - Discharge patient to home.                        - Resume previous diet.                        -  Continue present medications.                        - Repeat colonoscopy in 5 years for screening purposes.                        - Return to primary care physician as previously                         scheduled.                        - The findings and recommendations were discussed with                         the patient.                        - The findings and recommendations were discussed with                         the patient's family. Procedure Code(s):     --- Professional ---                        [phone], Colonoscopy, flexible; diagnostic, including                         collection of specimen(s) by  brushing or washing, when                         performed (separate procedure) Diagnosis Code(s):     --- Professional ---                        R93.3, Abnormal findings on diagnostic imaging of                         other parts of digestive tract CPT copyright 2019 American Medical Association. All rights reserved. The codes documented in this report are preliminary and upon coder review may  be revised to meet current compliance requirements.  [name], MD Margretta Sidle B. Bonna Gains MD, MD 07/24/2019 11:23:52 AM This report has been signed electronically. Number of Addenda: 0 Note Initiated On: 07/24/2019 10:42 AM Scope Withdrawal Time: 0 hours 13 minutes 21 seconds  Total Procedure Duration: 0 hours 17 minutes 32 seconds       [facility]

## 2019-07-24 NOTE — Transfer of Care (Signed)
Immediate Anesthesia Transfer of Care Note  Patient: Shirley [name]  Procedure(s) Performed: ESOPHAGOGASTRODUODENOSCOPY (EGD) WITH PROPOFOL (N/A ) COLONOSCOPY WITH PROPOFOL (N/A )  Patient Location: PACU  Anesthesia Type: General  Level of Consciousness: awake, alert  and patient cooperative  Airway and Oxygen Therapy: Patient Spontanous Breathing and Patient connected to supplemental oxygen  Post-op Assessment: Post-op Vital signs reviewed, Patient's Cardiovascular Status Stable, Respiratory Function Stable, Patent Airway and No signs of Nausea or vomiting  Post-op Vital Signs: Reviewed and stable  Complications: No apparent anesthesia complications

## 2019-07-24 NOTE — Anesthesia Postprocedure Evaluation (Signed)
Anesthesia Post Note  Patient: TZIVIA ONEIL  Procedure(s) Performed: ESOPHAGOGASTRODUODENOSCOPY (EGD) WITH PROPOFOL (N/A ) COLONOSCOPY WITH PROPOFOL (N/A )     Patient location during evaluation: PACU Anesthesia Type: General Level of consciousness: awake and alert and oriented Pain management: satisfactory to patient Vital Signs Assessment: post-procedure vital signs reviewed and stable Respiratory status: spontaneous breathing, nonlabored ventilation and respiratory function stable Cardiovascular status: blood pressure returned to baseline and stable Postop Assessment: Adequate PO intake and No signs of nausea or vomiting Anesthetic complications: no    Raliegh Ip

## 2019-07-24 NOTE — Op Note (Signed)
[facility] Patient Name: Shirley [name] Procedure Date: 07/24/2019 10:41 AM MRN: [id number] Account #: [id number] Date of Birth: 12-02-1975 Admit Type: Outpatient Age: 43 Room: [facility] OR ROOM 01 Gender: Female Note Status: Finalized Procedure:             Upper GI endoscopy Indications:           Dysphagia, Nausea, Villous blunting on previous                         duodenal biopsies Providers:             Inna Tisdell B. Bonna Gains MD, MD Medicines:             Monitored Anesthesia Care Complications:         No immediate complications. Procedure:             Pre-Anesthesia Assessment:                        - The risks and benefits of the procedure and the                         sedation options and risks were discussed with the                         patient. All questions were answered and informed                         consent was obtained.                        - Patient identification and proposed procedure were                         verified prior to the procedure.                        - ASA Grade Assessment: II - A patient with mild                         systemic disease.                        After obtaining informed consent, the endoscope was                         passed under direct vision. Throughout the procedure,                         the patient's blood pressure, pulse, and oxygen                         saturations were monitored continuously. The                         Endosonoscope was introduced through the mouth, and                         advanced to the second part of duodenum. The upper GI  endoscopy was accomplished with ease. The patient                         tolerated the procedure well. Findings:      The examined esophagus was normal. Biopsies were obtained from the       proximal and distal esophagus with cold forceps for histology of       suspected eosinophilic esophagitis.   One non-bleeding gastric ulcer with a clean ulcer base (Forrest Class       III) was found at the pylorus. The lesion was 5 mm in largest dimension.       Biopsies were taken with a cold forceps for histology. Biopsies obtained       from ulcer edge      There is no endoscopic evidence of bleeding or inflammation in the       entire examined stomach. Biopsies were obtained in the gastric body, at       the incisura and in the gastric antrum with cold forceps for histology.      The examined duodenum was normal. Biopsies were obtained in the duodenal       bulb and in the second portion of the duodenum with cold forceps for       histology. Impression:            - Normal esophagus. Biopsied.                        - Non-bleeding gastric ulcer with a clean ulcer base                         (Forrest Class III). Biopsied.                        - Normal examined duodenum.                        - Biopsies were obtained in the gastric body, at the                         incisura and in the gastric antrum.                        - Biopsies were obtained in the duodenal bulb and in                         the second portion of the duodenum. Recommendation:        - Await pathology results.                        - Avoid NSAIDs except Aspirin if medically indicated                         by PCP                        - Take prescribed proton pump inhibitor or H2 blocker                         (antacid) medications 30 - 60 minutes before meals.                        -  Continue present medications.                        - Return to my office as previously scheduled.                        - Return to primary care physician in 4 weeks.                        - The findings and recommendations were discussed with                         the patient.                        - The findings and recommendations were discussed with                         the patient's family. Procedure Code(s):      --- Professional ---                        [phone], Esophagogastroduodenoscopy, flexible,                         transoral; with biopsy, single or multiple Diagnosis Code(s):     --- Professional ---                        K25.9, Gastric ulcer, unspecified as acute or chronic,                         without hemorrhage or perforation                        R13.10, Dysphagia, unspecified                        R11.0, Nausea CPT copyright 2019 American Medical Association. All rights reserved. The codes documented in this report are preliminary and upon coder review may  be revised to meet current compliance requirements.  Melodie BouillonVarnita Canyon Willow, MD Michel BickersVarnita B. Maximino Greenlandahiliani MD, MD 07/24/2019 11:03:30 AM This report has been signed electronically. Number of Addenda: 0 Note Initiated On: 07/24/2019 10:41 AM Estimated Blood Loss:  Estimated blood loss: none.      [facility]

## 2019-07-24 NOTE — Anesthesia Preprocedure Evaluation (Signed)
Anesthesia Evaluation  Patient identified by MRN, date of birth, ID band Patient awake    Reviewed: Allergy & Precautions, H&P , NPO status , Patient's Chart, lab work & pertinent test results  Airway Mallampati: III  TM Distance: >3 FB Neck ROM: full    Dental  (+) Partial Lower   Pulmonary Current Smoker and Patient abstained from smoking.,    Pulmonary exam normal breath sounds clear to auscultation       Cardiovascular hypertension, Normal cardiovascular exam Rhythm:regular Rate:Normal     Neuro/Psych Seizures -,     GI/Hepatic GERD  ,  Endo/Other    Renal/GU      Musculoskeletal   Abdominal   Peds  Hematology   Anesthesia Other Findings   Reproductive/Obstetrics                             Anesthesia Physical Anesthesia Plan  ASA: II  Anesthesia Plan: General   Post-op Pain Management:    Induction: Intravenous  PONV Risk Score and Plan: 3 and Propofol infusion, Treatment may vary due to age or medical condition and TIVA  Airway Management Planned: Natural Airway  Additional Equipment:   Intra-op Plan:   Post-operative Plan:   Informed Consent: I have reviewed the patients History and Physical, chart, labs and discussed the procedure including the risks, benefits and alternatives for the proposed anesthesia with the patient or authorized representative who has indicated his/her understanding and acceptance.       Plan Discussed with: CRNA  Anesthesia Plan Comments: (Last potassium at hospital was 2.8.  Pt states she was treated, but there is no confirmatory lab.  Will draw today and confirm normal prior to procedure.)        Anesthesia Quick Evaluation

## 2019-07-27 ENCOUNTER — Other Ambulatory Visit: Payer: Self-pay

## 2019-07-28 ENCOUNTER — Other Ambulatory Visit: Payer: Self-pay | Admitting: *Deleted

## 2019-07-28 ENCOUNTER — Encounter: Payer: Self-pay | Admitting: Gastroenterology

## 2019-07-28 ENCOUNTER — Other Ambulatory Visit: Payer: Self-pay

## 2019-07-28 ENCOUNTER — Inpatient Hospital Stay: Payer: BC Managed Care – PPO

## 2019-07-28 ENCOUNTER — Inpatient Hospital Stay: Payer: BC Managed Care – PPO | Attending: Oncology | Admitting: Oncology

## 2019-07-28 ENCOUNTER — Telehealth: Payer: Self-pay | Admitting: *Deleted

## 2019-07-28 ENCOUNTER — Encounter: Payer: Self-pay | Admitting: Oncology

## 2019-07-28 VITALS — BP 141/100 | HR 96 | Temp 97.9°F | Ht 62.0 in | Wt 120.0 lb

## 2019-07-28 DIAGNOSIS — F17210 Nicotine dependence, cigarettes, uncomplicated: Secondary | ICD-10-CM

## 2019-07-28 DIAGNOSIS — D649 Anemia, unspecified: Secondary | ICD-10-CM | POA: Insufficient documentation

## 2019-07-28 DIAGNOSIS — Z800 Family history of malignant neoplasm of digestive organs: Secondary | ICD-10-CM | POA: Insufficient documentation

## 2019-07-28 DIAGNOSIS — F1290 Cannabis use, unspecified, uncomplicated: Secondary | ICD-10-CM

## 2019-07-28 DIAGNOSIS — R5383 Other fatigue: Secondary | ICD-10-CM | POA: Insufficient documentation

## 2019-07-28 LAB — CBC WITH DIFFERENTIAL/PLATELET
Abs Immature Granulocytes: 0.01 10*3/uL (ref 0.00–0.07)
Basophils Absolute: 0 10*3/uL (ref 0.0–0.1)
Basophils Relative: 0 %
Eosinophils Absolute: 0.2 10*3/uL (ref 0.0–0.5)
Eosinophils Relative: 3 %
HCT: 28.1 % — ABNORMAL LOW (ref 36.0–46.0)
Hemoglobin: 8.8 g/dL — ABNORMAL LOW (ref 12.0–15.0)
Immature Granulocytes: 0 %
Lymphocytes Relative: 44 %
Lymphs Abs: 2.2 10*3/uL (ref 0.7–4.0)
MCH: 27.2 pg (ref 26.0–34.0)
MCHC: 31.3 g/dL (ref 30.0–36.0)
MCV: 87 fL (ref 80.0–100.0)
Monocytes Absolute: 0.2 10*3/uL (ref 0.1–1.0)
Monocytes Relative: 4 %
Neutro Abs: 2.3 10*3/uL (ref 1.7–7.7)
Neutrophils Relative %: 49 %
Platelets: 386 10*3/uL (ref 150–400)
RBC: 3.23 MIL/uL — ABNORMAL LOW (ref 3.87–5.11)
RDW: 15.9 % — ABNORMAL HIGH (ref 11.5–15.5)
WBC: 4.9 10*3/uL (ref 4.0–10.5)
nRBC: 0 % (ref 0.0–0.2)

## 2019-07-28 LAB — VITAMIN B12: Vitamin B-12: 514 pg/mL (ref 180–914)

## 2019-07-28 LAB — COMPREHENSIVE METABOLIC PANEL
ALT: 13 U/L (ref 0–44)
AST: 17 U/L (ref 15–41)
Albumin: 4.4 g/dL (ref 3.5–5.0)
Alkaline Phosphatase: 39 U/L (ref 38–126)
Anion gap: 8 (ref 5–15)
BUN: 8 mg/dL (ref 6–20)
CO2: 23 mmol/L (ref 22–32)
Calcium: 9.1 mg/dL (ref 8.9–10.3)
Chloride: 104 mmol/L (ref 98–111)
Creatinine, Ser: 0.55 mg/dL (ref 0.44–1.00)
GFR calc Af Amer: >60 mL/min (ref >60)
GFR calc non Af Amer: >60 mL/min (ref >60)
Glucose, Bld: 95 mg/dL (ref 70–99)
Potassium: 2.9 mmol/L — ABNORMAL LOW (ref 3.5–5.1)
Sodium: 135 mmol/L (ref 135–145)
Total Bilirubin: 0.4 mg/dL (ref 0.3–1.2)
Total Protein: 7.7 g/dL (ref 6.5–8.1)

## 2019-07-28 LAB — FOLATE: Folate: 37 ng/mL (ref >5.9)

## 2019-07-28 LAB — LACTATE DEHYDROGENASE: LDH: 126 U/L (ref 98–192)

## 2019-07-28 LAB — RETICULOCYTES
Immature Retic Fract: 18.2 % — ABNORMAL HIGH (ref 2.3–15.9)
RBC.: 3.23 MIL/uL — ABNORMAL LOW (ref 3.87–5.11)
Retic Count, Absolute: 30 10*3/uL (ref 19.0–186.0)
Retic Ct Pct: 0.9 % (ref 0.4–3.1)

## 2019-07-28 LAB — TSH: TSH: 1.673 u[IU]/mL (ref 0.350–4.500)

## 2019-07-28 MED ORDER — POTASSIUM CHLORIDE CRYS ER 20 MEQ PO TBCR: 40.0000 meq | EXTENDED_RELEASE_TABLET | Freq: Every day | ORAL | 0 refills | Status: DC

## 2019-07-28 NOTE — Telephone Encounter (Signed)
-----   Message from Sindy Guadeloupe, MD sent at 07/28/2019  4:00 PM EST ----- Potassium very low. Please send her prescription for oral potassium 40 daily for 2 weeks

## 2019-07-28 NOTE — Progress Notes (Signed)
Patient is here today to establish care for anemia. Patient stated that she had suffered from anemia before and had an ablation done in 2017 due to heavy menstrual cycles. Patient stated that she stays tried and weak. Patient also stated that she has had frequent UTI's and kidney infections.

## 2019-07-28 NOTE — Telephone Encounter (Signed)
Called pt and let her know that potassium was low and Dr. Janese Banks would like her to take 2 potassium pills a day for 2 weeks. Pt agreeable and the rx was sent to her [facility] in [place].

## 2019-07-29 LAB — KAPPA/LAMBDA LIGHT CHAINS
Kappa free light chain: 13.8 mg/L (ref 3.3–19.4)
Kappa, lambda light chain ratio: 1.12 (ref 0.26–1.65)
Lambda free light chains: 12.3 mg/L (ref 5.7–26.3)

## 2019-07-29 LAB — HAPTOGLOBIN: Haptoglobin: 221 mg/dL (ref 42–296)

## 2019-07-30 ENCOUNTER — Encounter: Payer: Self-pay | Admitting: Oncology

## 2019-07-30 LAB — MULTIPLE MYELOMA PANEL, SERUM
Albumin SerPl Elph-Mcnc: 4 g/dL (ref 2.9–4.4)
Albumin/Glob SerPl: 1.3 (ref 0.7–1.7)
Alpha 1: 0.3 g/dL (ref 0.0–0.4)
Alpha2 Glob SerPl Elph-Mcnc: 0.8 g/dL (ref 0.4–1.0)
B-Globulin SerPl Elph-Mcnc: 1 g/dL (ref 0.7–1.3)
Gamma Glob SerPl Elph-Mcnc: 1 g/dL (ref 0.4–1.8)
Globulin, Total: 3.1 g/dL (ref 2.2–3.9)
IgA: 193 mg/dL (ref 87–352)
IgG (Immunoglobin G), Serum: 1023 mg/dL (ref 586–1602)
IgM (Immunoglobulin M), Srm: 68 mg/dL (ref 26–217)
Total Protein ELP: 7.1 g/dL (ref 6.0–8.5)

## 2019-07-30 NOTE — Progress Notes (Signed)
Hematology/Oncology Consult note [facility] [phone] [phone]  Patient Care Team: Inc, [facility] as PCP - General   Name of the patient: Shirley [name]  [id number]  Nov 07, 1975    Reason for referral- anemia   Referring physician- Dr. Bonna Gains  Date of visit: 07/30/19   History of presenting illness- Patient is a 43 year old African-American female referred to [place] for normocytic anemia.  Her most recent CBC from 07/08/2019 showed white count of 6.1, H&H of 9.9/31.1 and a platelet count of 402.  Looking back at her prior CBCs patient had a normal hemoglobin of around 12 between 20 17-20 18.  Since November 2018 her hemoglobin has been mostly fluctuating around 10 and then drifted down to 9 more recently in October 2020.  Iron studies on 07/21/2019 showed a low iron saturation of 10% with a normal TIBC of 332 patient also underwent EGD and colonoscopy by Dr. Dalia Heading in November 2020 which did not show any evidence of bleeding and normal ferritin of 45.  Currently patient feels fatigued but denies other complaints.  Patient has had recurrent UTIs in the past.  ECOG PS- 1  Pain scale- 0   Review of systems- Review of Systems  Constitutional: Positive for malaise/fatigue. Negative for chills, fever and weight loss.  HENT: Negative for congestion, ear discharge and nosebleeds.   Eyes: Negative for blurred vision.  Respiratory: Negative for cough, hemoptysis, sputum production, shortness of breath and wheezing.   Cardiovascular: Negative for chest pain, palpitations, orthopnea and claudication.  Gastrointestinal: Negative for abdominal pain, blood in stool, constipation, diarrhea, heartburn, melena, nausea and vomiting.  Genitourinary: Negative for dysuria, flank pain, frequency, hematuria and urgency.  Musculoskeletal: Negative for back pain, joint pain and myalgias.  Skin: Negative for rash.  Neurological: Negative  for dizziness, tingling, focal weakness, seizures, weakness and headaches.  Endo/Heme/Allergies: Does not bruise/bleed easily.  Psychiatric/Behavioral: Negative for depression and suicidal ideas. The patient does not have insomnia.     Allergies  Allergen Reactions   Lisinopril Anaphylaxis and Swelling    Feet, hands, and throat became swollen   Aspirin Nausea Only    Makes stomach burn   Ibuprofen Nausea Only    Makes stomach burn   Other Nausea Only and Other (See Comments)    No spicy foods = Indigestion, also    Patient Active Problem List   Diagnosis Date Noted   Acute peptic ulcer of stomach    Dysphagia, idiopathic    Nausea    Abnormal CT scan, colon    Pyelonephritis 08/03/2017   Bacteremia 08/03/2017   Hypokalemia 08/03/2017   Tobacco use 08/03/2017   Acute postoperative pain 07/31/2016   Menometrorrhagia 04/09/2016   Anemia 04/09/2016   Abnormal uterine bleeding (AUB) 04/09/2016   Essential hypertension 11/17/2009     Past Medical History:  Diagnosis Date   Anemia    Bacteremia    Blood dyscrasia    has to apply a lot of pressure to stop bleeding   GERD (gastroesophageal reflux disease)    Heart murmur    with pregnancy   History of hiatal hernia    Hypertension    4-5 years ago, no meds   Kidney infection    patient states I might have a kidney infection   Migraine    migraines 1-2x/month   Pyelonephritis    Seizure ([place]) 05/09/2017   due to low potassium   Wears dentures    partial upper and  lower     Past Surgical History:  Procedure Laterality Date   ABDOMINAL HERNIA REPAIR     CESAREAN SECTION     x 4   CESAREAN SECTION     x 4   COLONOSCOPY WITH PROPOFOL N/A 07/24/2019   Procedure: COLONOSCOPY WITH PROPOFOL;  Surgeon: Virgel Manifold, MD;  Location: [facility];  Service: Endoscopy;  Laterality: N/A;   ENDOMETRIAL ABLATION  07/31/2016   ESOPHAGOGASTRODUODENOSCOPY (EGD) WITH PROPOFOL  N/A 07/24/2019   Procedure: ESOPHAGOGASTRODUODENOSCOPY (EGD) WITH PROPOFOL;  Surgeon: Virgel Manifold, MD;  Location: [facility];  Service: Endoscopy;  Laterality: N/A;   HYSTEROSCOPY  07/31/2016   Procedure: HYSTEROSCOPY WITH HYDROTHERMAL ABLATION;  Surgeon: Aletha Halim, MD;  Location: [facility];  Service: Gynecology;;    Social History   Socioeconomic History   Marital status: Married    Spouse name: Not on file   Number of children: 4   Years of education: Not on file   Highest education level: Not on file  Occupational History   Not on file  Social Needs   Financial resource strain: Not on file   Food insecurity    Worry: Not on file    Inability: Not on file   Transportation needs    Medical: Not on file    Non-medical: Not on file  Tobacco Use   Smoking status: Current Every Day Smoker    Packs/day: 0.50    Types: Cigarettes   Smokeless tobacco: Never Used   Tobacco comment: 1 black and mild/day  Substance and Sexual Activity   Alcohol use: Yes    Comment: occasionally   Drug use: Yes    Types: Marijuana    Comment: last  time: 07/18/19   Sexual activity: Yes    Birth control/protection: None  Lifestyle   Physical activity    Days per week: Not on file    Minutes per session: Not on file   Stress: Not on file  Relationships   Social connections    Talks on phone: Not on file    Gets together: Not on file    Attends religious service: Not on file    Active member of club or organization: Not on file    Attends meetings of clubs or organizations: Not on file    Relationship status: Not on file   Intimate partner violence    Fear of current or ex partner: Not on file    Emotionally abused: Not on file    Physically abused: Not on file    Forced sexual activity: Not on file  Other Topics Concern   Not on file  Social History Narrative   Not on file     Family History  Problem Relation Age of Onset   Hypertension  Mother    Colon cancer Father 17   Stroke Brother      Current Outpatient Medications:    acetaminophen (TYLENOL) 500 MG tablet, Take 1,000 mg by mouth every 6 (six) hours as needed for mild pain or headache., Disp: , Rfl:    albuterol (PROVENTIL HFA;VENTOLIN HFA) 108 (90 Base) MCG/ACT inhaler, Inhale 2 puffs into the lungs every 6 (six) hours as needed for wheezing or shortness of breath., Disp: , Rfl:    amLODipine (NORVASC) 2.5 MG tablet, Take 2.5 mg by mouth daily., Disp: , Rfl: 1   Ascorbic Acid (VITAMIN C PO), Take by mouth daily., Disp: , Rfl:    metoCLOPramide (REGLAN) 5 MG tablet,  Take 1 tablet (5 mg total) by mouth every 6 (six) hours as needed for nausea., Disp: 20 tablet, Rfl: 0   Multiple Vitamins-Minerals (WOMENS DAILY FORMULA PO), Take 1 tablet by mouth daily., Disp: , Rfl:    omeprazole (PRILOSEC) 20 MG capsule, Take 2 capsules (40 mg total) by mouth 2 (two) times daily., Disp: 240 capsule, Rfl: 0   polyethylene glycol (MIRALAX / GLYCOLAX) packet, Take 17 g by mouth daily as needed for mild constipation. , Disp: , Rfl:    sucralfate (CARAFATE) 1 g tablet, Take 1 tablet (1 g total) by mouth 4 (four) times daily for 15 days., Disp: 60 tablet, Rfl: 0   bisacodyl (BISACODYL) 5 MG EC tablet, Take 2 tablets (74m) by mouth the day before your procedure between 1pm-3pm. (Patient not taking: Reported on 07/28/2019), Disp: 30 tablet, Rfl: 0   potassium chloride SA (KLOR-CON) 20 MEQ tablet, Take 2 tablets (40 mEq total) by mouth daily., Disp: 28 tablet, Rfl: 0  Current Facility-Administered Medications:    0.9 %  sodium chloride infusion, 500 mL, Intravenous, Once, Danis, HKirke Corin MD   Physical exam:  Vitals:   07/28/19 1445  BP: (!) 141/100  Pulse: 96  Temp: 97.9 F (36.6 C)  TempSrc: Tympanic  Weight: 120 lb (54.4 kg)  Height: 5' 2"  (1.575 m)   Physical Exam Constitutional:      General: She is not in acute distress. HENT:     Head: Normocephalic and  atraumatic.  Eyes:     Pupils: Pupils are equal, round, and reactive to light.  Neck:     Musculoskeletal: Normal range of motion.  Cardiovascular:     Rate and Rhythm: Normal rate and regular rhythm.     Heart sounds: Normal heart sounds.  Pulmonary:     Effort: Pulmonary effort is normal.     Breath sounds: Normal breath sounds.  Abdominal:     General: Bowel sounds are normal.     Palpations: Abdomen is soft.  Lymphadenopathy:     Comments: No palpable cervical, supraclavicular, axillary or inguinal adenopathy   Skin:    General: Skin is warm and dry.  Neurological:     Mental Status: She is alert and oriented to person, place, and time.        CMP Latest Ref Rng & Units 07/28/2019  Glucose 70 - 99 mg/dL 95  BUN 6 - 20 mg/dL 8  Creatinine 0.44 - 1.00 mg/dL 0.55  Sodium 135 - 145 mmol/L 135  Potassium 3.5 - 5.1 mmol/L 2.9(L)  Chloride 98 - 111 mmol/L 104  CO2 22 - 32 mmol/L 23  Calcium 8.9 - 10.3 mg/dL 9.1  Total Protein 6.5 - 8.1 g/dL 7.7  Total Bilirubin 0.3 - 1.2 mg/dL 0.4  Alkaline Phos 38 - 126 U/L 39  AST 15 - 41 U/L 17  ALT 0 - 44 U/L 13   CBC Latest Ref Rng & Units 07/28/2019  WBC 4.0 - 10.5 K/uL 4.9  Hemoglobin 12.0 - 15.0 g/dL 8.8(L)  Hematocrit 36.0 - 46.0 % 28.1(L)  Platelets 150 - 400 K/uL 386    No images are attached to the encounter.  Ct Abdomen Pelvis W Contrast  Result Date: 07/08/2019 CLINICAL DATA:  Acute generalized abdominal pain for 3 days, constipation for 2 days EXAM: CT ABDOMEN AND PELVIS WITH CONTRAST TECHNIQUE: Multidetector CT imaging of the abdomen and pelvis was performed using the standard protocol following bolus administration of intravenous contrast. CONTRAST:  1041mOMNIPAQUE  IOHEXOL 300 MG/ML  SOLN COMPARISON:  CT abdomen pelvis 03/11/2019 FINDINGS: Lower chest: Lung bases are clear. Normal heart size. No pericardial effusion. Hepatobiliary: No focal liver abnormality is seen. No calcified gallstones, pericholecystic  inflammation or gallbladder wall thickening. Borderline dilation of the common bile duct (8 mm) is nonspecific particularly given stability from comparison exam. No visible intraductal gallstones. Pancreas: Unremarkable. No pancreatic ductal dilatation or surrounding inflammatory changes. Spleen: Normal in size without focal abnormality. Adrenals/Urinary Tract: Normal adrenal glands. Subcentimeter hypoattenuating focus in the interpolar left kidney, too small to fully characterize on CT imaging but statistically likely benign. Kidneys are otherwise unremarkable, without renal calculi, suspicious lesion, or hydronephrosis. Mild bladder wall thickening. Stomach/Bowel: Distal esophagus, stomach and duodenal sweep are unremarkable. No small bowel wall thickening or dilatation. No evidence of obstruction. A normal appendix is visualized. There is segmental thickening of the sigmoid colon which is similar to comparison exam. Remaining portions of the colon are free from colonic dilatation or wall thickening. Vascular/Lymphatic: Atherosclerotic plaque within the normal caliber aorta. Bilateral parametrial venous engorgement. Evaluation of the lymph nodes is limited given a paucity of intraperitoneal fat. No pathologically enlarged nodes are clearly identified. Reproductive: Retroflexed uterus appears adherent to the anterior abdominal wall, likely reflecting postsurgical change from multiple prior Caesarean sections. There is a typical enhancement of the uterine fundus with a small amount of fluid or thickening of the fundal endometrium which is nonspecific on this CT exam. No concerning adnexal lesions. Metallic clip in the pelvis may reflect prior ligation. Other: Paucity of intraperitoneal fat. No free fluid or free air. Small stable ventral hernia of the midline rectus sheath (2/28, 6/51) unchanged from priors. Postsurgical changes of the low anterior abdominal wall. Musculoskeletal: Multilevel degenerative changes are  present in the imaged portions of the spine. No acute osseous abnormality or suspicious osseous lesion. IMPRESSION: 1. Mild segmental thickening of the sigmoid colon. Could reflect underdistention or sequela of chronic inflammation. Consider outpatient correlation with direct visualization. 2. Retroflexed uterus appears adherent to the anterior abdominal wall, likely reflecting postsurgical change from multiple prior Caesarean sections. There is a small amount of fluid or thickening of the fundal endometrium which is nonspecific on this CT exam. Consider further evaluation with pelvic ultrasound as clinically indicated. 3. Bilateral parametrial venous engorgement, which can be reactive to the above process or seen with pelvic congestion syndrome in the appropriate clinical setting. 4. Mild bladder wall thickening. Correlate with urinalysis to exclude cystitis. 5. Unchanged fat containing ventral hernia of the midline rectus sheath. 6. Aortic Atherosclerosis (ICD10-I70.0). Electronically Signed   By: Lovena Le M.D.   On: 07/08/2019 22:31   US Pelvic Complete With Transvaginal  Result Date: 07/09/2019 CLINICAL DATA:  43 year old female with acute generalized abdominal pain x3 days. History of endometrial ablation and multiple C-sections. EXAM: TRANSABDOMINAL AND TRANSVAGINAL ULTRASOUND OF PELVIS TECHNIQUE: Both transabdominal and transvaginal ultrasound examinations of the pelvis were performed. Transabdominal technique was performed for global imaging of the pelvis including uterus, ovaries, adnexal regions, and pelvic cul-de-sac. It was necessary to proceed with endovaginal exam following the transabdominal exam to visualize the endometrium and ovaries. COMPARISON:  CT of the abdomen pelvis dated 07/08/2019 and pelvic ultrasound dated 05/05/2018. FINDINGS: Uterus Measurements: 7.3 x 3.9 x 4.4 cm = volume: 66 mL. The uterus is retroflexed otherwise unremarkable. Endometrium Thickness: 7 mm. Trace fluid noted  in the lower endometrium. No abnormal endometrial vascularity identified. Right ovary Measurements: 2.9 x 1.1 x 1.2 cm = volume: 1.9  mL. Normal appearance/no adnexal mass. Left ovary Measurements: 2.4 x 1.6 x 2.3 cm = volume: 4.6 mL. Normal appearance/no adnexal mass. Other findings There is a crescentic echogenic structure in the region of the left adnexa (image 80/95) with clear posterior shadowing. No corresponding structure noted on the earlier CT. IMPRESSION: 1. Retroflexed uterus. 2. Unremarkable endometrium and ovaries. Electronically Signed   By: Anner Crete M.D.   On: 07/09/2019 01:49    Assessment and plan- Patient is a 43 y.o. female referred for normocytic anemia  Patient's most recent iron studies reveal a low normal ferritin of 45 And a low iron saturation of 10% with a normal TIBC of 332.  This may indicate borderline iron deficiency.  I will do a complete anemia work-up at this time including a CBC with differential, CMP,  B12 and folate, reticulocyte count, haptoglobin, TSH, LDH, myeloma panel, copper and B6 and serum free light chains  I will see her in about 2 weeks time for a video visit to discuss the results of blood work.  If anemia work-up does not reveal any other overt cause of anemia, I will give her a trial of IV iron to see if her hemoglobin improves.  If her hemoglobin does not improve despite that I will consider a bone marrow biopsy at that time   Thank you for this kind referral and the opportunity to participate in the care of this patient   Visit Diagnosis 1. Normocytic anemia     Dr. Randa Evens, MD, MPH [facility] at [facility] [phone] 07/30/2019

## 2019-07-31 LAB — COPPER, SERUM: Copper: 110 ug/dL (ref 72–166)

## 2019-08-01 LAB — VITAMIN B6: Vitamin B6: 20.1 ug/L (ref 2.0–32.8)

## 2019-08-03 MED ORDER — OMEPRAZOLE 40 MG PO CPDR: 40.0000 mg | DELAYED_RELEASE_CAPSULE | Freq: Two times a day (BID) | ORAL | 0 refills | Status: DC

## 2019-08-05 ENCOUNTER — Encounter: Payer: Self-pay | Admitting: Gastroenterology

## 2019-08-10 ENCOUNTER — Inpatient Hospital Stay (HOSPITAL_BASED_OUTPATIENT_CLINIC_OR_DEPARTMENT_OTHER): Payer: BC Managed Care – PPO | Admitting: Oncology

## 2019-08-10 ENCOUNTER — Encounter: Payer: Self-pay | Admitting: Oncology

## 2019-08-10 ENCOUNTER — Other Ambulatory Visit: Payer: Self-pay

## 2019-08-10 DIAGNOSIS — D649 Anemia, unspecified: Secondary | ICD-10-CM

## 2019-08-10 DIAGNOSIS — D509 Iron deficiency anemia, unspecified: Secondary | ICD-10-CM | POA: Diagnosis not present

## 2019-08-10 NOTE — Progress Notes (Signed)
Patient stated that she had been doing well. Patient stated that she would like to know about her lab results.

## 2019-08-11 NOTE — Telephone Encounter (Signed)
error 

## 2019-08-12 NOTE — Progress Notes (Signed)
I connected with Shirley [name] on 08/12/19 at  3:00 PM EST by video enabled telemedicine visit and verified that I am speaking with the correct person using two identifiers.   I discussed the limitations, risks, security and privacy concerns of performing an evaluation and management service by telemedicine and the availability of in-person appointments. I also discussed with the patient that there may be a patient responsible charge related to this service. The patient expressed understanding and agreed to proceed.  Other persons participating in the visit and their role in the encounter:  none  Patient's location:  home Provider's location:  work  Chief Complaint:  Discuss results of bloodwork  History of present illness: Patient is a 43-year-old African-American female referred to us for normocytic anemia.  Her most recent CBC from 07/08/2019 showed white count of 6.1, H&H of 9.9/31.1 and a platelet count of 402.  Looking back at her prior CBCs patient had a normal hemoglobin of around 12 between 20 17-20 18.  Since November 2018 her hemoglobin has been mostly fluctuating around 10 and then drifted down to 9 more recently in October 2020.  Iron studies on 07/21/2019 showed a low iron saturation of 10% with a normal TIBC of 332 patient also underwent EGD and colonoscopy by Dr. Bayani in November 2020 which did not show any evidence of bleeding and normal ferritin of 45.  Results of blood work from 07/28/2019 were as follows: CBC showed white count of 4.9, H&H of 8.8/28.1 with an MCV of 87 and a platelet count of 386.  CMP showed hypokalemia with a potassium of 2.9.  Folate was normal.  Reticulocyte count was low at 0.9% indicating hypoproliferative anemia.  Haptoglobin and TSH was normal.  Serum free light chain ratio and myeloma panel was unremarkable.  And he was normal, serum copper, B6 and B12 levels were normal.   Interval history she denies any complaints other than fatigue   Review of  Systems  Constitutional: Negative for chills, fever, malaise/fatigue and weight loss.  HENT: Negative for congestion, ear discharge and nosebleeds.   Eyes: Negative for blurred vision.  Respiratory: Negative for cough, hemoptysis, sputum production, shortness of breath and wheezing.   Cardiovascular: Negative for chest pain, palpitations, orthopnea and claudication.  Gastrointestinal: Negative for abdominal pain, blood in stool, constipation, diarrhea, heartburn, melena, nausea and vomiting.  Genitourinary: Negative for dysuria, flank pain, frequency, hematuria and urgency.  Musculoskeletal: Negative for back pain, joint pain and myalgias.  Skin: Negative for rash.  Neurological: Negative for dizziness, tingling, focal weakness, seizures, weakness and headaches.  Endo/Heme/Allergies: Does not bruise/bleed easily.  Psychiatric/Behavioral: Negative for depression and suicidal ideas. The patient does not have insomnia.     Allergies  Allergen Reactions  . Lisinopril Anaphylaxis and Swelling    Feet, hands, and throat became swollen  . Aspirin Nausea Only    Makes stomach burn  . Ibuprofen Nausea Only    Makes stomach burn  . Other Nausea Only and Other (See Comments)    No spicy foods = Indigestion, also    Past Medical History:  Diagnosis Date  . Anemia   . Bacteremia   . Blood dyscrasia    has to apply a lot of pressure to stop bleeding  . GERD (gastroesophageal reflux disease)   . Heart murmur    with pregnancy  . History of hiatal hernia   . Hypertension    4-5 years ago, no meds  . Kidney infection    patient   states I might have a kidney infection  . Migraine    migraines 1-2x/month  . Pyelonephritis   . Seizure ([place]) 05/09/2017   due to low potassium  . Wears dentures    partial upper and lower    Past Surgical History:  Procedure Laterality Date  . ABDOMINAL HERNIA REPAIR    . CESAREAN SECTION     x 4  . CESAREAN SECTION     x 4  . COLONOSCOPY WITH  PROPOFOL N/A 07/24/2019   Procedure: COLONOSCOPY WITH PROPOFOL;  Surgeon: Virgel Manifold, MD;  Location: [facility];  Service: Endoscopy;  Laterality: N/A;  . ENDOMETRIAL ABLATION  07/31/2016  . ESOPHAGOGASTRODUODENOSCOPY (EGD) WITH PROPOFOL N/A 07/24/2019   Procedure: ESOPHAGOGASTRODUODENOSCOPY (EGD) WITH PROPOFOL;  Surgeon: Virgel Manifold, MD;  Location: [facility];  Service: Endoscopy;  Laterality: N/A;  . HYSTEROSCOPY  07/31/2016   Procedure: HYSTEROSCOPY WITH HYDROTHERMAL ABLATION;  Surgeon: Aletha Halim, MD;  Location: [facility];  Service: Gynecology;;    Social History   Socioeconomic History  . Marital status: Married    Spouse name: Not on file  . Number of children: 4  . Years of education: Not on file  . Highest education level: Not on file  Occupational History  . Not on file  Social Needs  . Financial resource strain: Not on file  . Food insecurity    Worry: Not on file    Inability: Not on file  . Transportation needs    Medical: Not on file    Non-medical: Not on file  Tobacco Use  . Smoking status: Current Every Day Smoker    Packs/day: 0.50    Types: Cigarettes  . Smokeless tobacco: Never Used  . Tobacco comment: 1 black and mild/day  Substance and Sexual Activity  . Alcohol use: Yes    Comment: occasionally  . Drug use: Yes    Types: Marijuana    Comment: last  time: 07/18/19  . Sexual activity: Yes    Birth control/protection: None  Lifestyle  . Physical activity    Days per week: Not on file    Minutes per session: Not on file  . Stress: Not on file  Relationships  . Social Herbalist on phone: Not on file    Gets together: Not on file    Attends religious service: Not on file    Active member of club or organization: Not on file    Attends meetings of clubs or organizations: Not on file    Relationship status: Not on file  . Intimate partner violence    Fear of current or ex partner: Not on file     Emotionally abused: Not on file    Physically abused: Not on file    Forced sexual activity: Not on file  Other Topics Concern  . Not on file  Social History Narrative  . Not on file    Family History  Problem Relation Age of Onset  . Hypertension Mother   . Colon cancer Father 39  . Stroke Brother      Current Outpatient Medications:  .  acetaminophen (TYLENOL) 500 MG tablet, Take 1,000 mg by mouth every 6 (six) hours as needed for mild pain or headache., Disp: , Rfl:  .  albuterol (PROVENTIL HFA;VENTOLIN HFA) 108 (90 Base) MCG/ACT inhaler, Inhale 2 puffs into the lungs every 6 (six) hours as needed for wheezing or shortness of breath., Disp: , Rfl:  .  amLODipine (NORVASC) 2.5 MG tablet, Take 2.5 mg by mouth daily., Disp: , Rfl: 1 .  Ascorbic Acid (VITAMIN C PO), Take by mouth daily., Disp: , Rfl:  .  Multiple Vitamins-Minerals (WOMENS DAILY FORMULA PO), Take 1 tablet by mouth daily., Disp: , Rfl:  .  omeprazole (PRILOSEC) 40 MG capsule, Take 1 capsule (40 mg total) by mouth 2 (two) times daily., Disp: 60 capsule, Rfl: 0 .  polyethylene glycol (MIRALAX / GLYCOLAX) packet, Take 17 g by mouth daily as needed for mild constipation. , Disp: , Rfl:  .  potassium chloride SA (KLOR-CON) 20 MEQ tablet, Take 2 tablets (40 mEq total) by mouth daily., Disp: 28 tablet, Rfl: 0  Current Facility-Administered Medications:  .  0.9 %  sodium chloride infusion, 500 mL, Intravenous, Once, Danis, Henry L III, MD  No results found.  No images are attached to the encounter.   CMP Latest Ref Rng & Units 07/28/2019  Glucose 70 - 99 mg/dL 95  BUN 6 - 20 mg/dL 8  Creatinine 0.44 - 1.00 mg/dL 0.55  Sodium 135 - 145 mmol/L 135  Potassium 3.5 - 5.1 mmol/L 2.9(L)  Chloride 98 - 111 mmol/L 104  CO2 22 - 32 mmol/L 23  Calcium 8.9 - 10.3 mg/dL 9.1  Total Protein 6.5 - 8.1 g/dL 7.7  Total Bilirubin 0.3 - 1.2 mg/dL 0.4  Alkaline Phos 38 - 126 U/L 39  AST 15 - 41 U/L 17  ALT 0 - 44 U/L 13   CBC  Latest Ref Rng & Units 07/28/2019  WBC 4.0 - 10.5 K/uL 4.9  Hemoglobin 12.0 - 15.0 g/dL 8.8(L)  Hematocrit 36.0 - 46.0 % 28.1(L)  Platelets 150 - 400 K/uL 386     Observation/objective: Appears in no acute distress of a video visit today.  Breathing is nonlabored  Assessment and plan: Patient is a 43-year-old female referred for normocytic anemia  Patient still has significant anemia her most recent hemoglobin was 8.8.  Work-up has been otherwise unremarkable and the only significant finding was borderline low iron levelsWith an iron saturation of 10% and ferritin that was 45.  I would like to give her a trial of IV iron and see if her anemia improves.  If anemia improve despite IV iron I will consider doing a bone marrow biopsy at that time.  Her insurance would approve Venofer and therefore she will need 200 mg IV 2 times a week for 5 doses.  Discussed risks and benefits of IV iron including all but not limited to headache, nausea, leg swelling and possible risk of infusion reaction.  Patient understands and agrees to proceed as planned.  Follow-up instructions: Repeat CBC in 1 month and 2 months and I will see her back in 2 months for a video visit  I discussed the assessment and treatment plan with the patient. The patient was provided an opportunity to ask questions and all were answered. The patient agreed with the plan and demonstrated an understanding of the instructions.   The patient was advised to call back or seek an in-person evaluation if the symptoms worsen or if the condition fails to improve as anticipated.  Visit Diagnosis: 1. Normocytic anemia   2. Iron deficiency anemia, unspecified iron deficiency anemia type     Dr. Archana Rao, MD, MPH [facility] Pager- [phone] 08/12/2019 7:37 AM  

## 2019-08-13 DIAGNOSIS — K253 Acute gastric ulcer without hemorrhage or perforation: Secondary | ICD-10-CM | POA: Diagnosis not present

## 2019-08-13 DIAGNOSIS — Z131 Encounter for screening for diabetes mellitus: Secondary | ICD-10-CM | POA: Diagnosis not present

## 2019-08-13 DIAGNOSIS — D509 Iron deficiency anemia, unspecified: Secondary | ICD-10-CM | POA: Diagnosis not present

## 2019-08-13 DIAGNOSIS — Z0000 Encounter for general adult medical examination without abnormal findings: Secondary | ICD-10-CM | POA: Diagnosis not present

## 2019-08-13 DIAGNOSIS — N3001 Acute cystitis with hematuria: Secondary | ICD-10-CM | POA: Diagnosis not present

## 2019-08-13 DIAGNOSIS — I10 Essential (primary) hypertension: Secondary | ICD-10-CM | POA: Diagnosis not present

## 2019-08-13 DIAGNOSIS — E876 Hypokalemia: Secondary | ICD-10-CM | POA: Diagnosis not present

## 2019-08-13 DIAGNOSIS — Z13220 Encounter for screening for lipoid disorders: Secondary | ICD-10-CM | POA: Diagnosis not present

## 2019-08-17 ENCOUNTER — Other Ambulatory Visit: Payer: Self-pay

## 2019-08-18 ENCOUNTER — Inpatient Hospital Stay: Payer: BC Managed Care – PPO | Attending: Oncology

## 2019-08-18 ENCOUNTER — Other Ambulatory Visit: Payer: Self-pay

## 2019-08-18 VITALS — BP 117/71 | HR 87 | Resp 18

## 2019-08-18 DIAGNOSIS — D509 Iron deficiency anemia, unspecified: Secondary | ICD-10-CM | POA: Insufficient documentation

## 2019-08-18 MED ORDER — IRON SUCROSE 20 MG/ML IV SOLN
200.0000 mg | Freq: Once | INTRAVENOUS | Status: AC
  Administered 2019-08-18: 14:00:00 200 mg via INTRAVENOUS
  Filled 2019-08-18: qty 10

## 2019-08-18 MED ORDER — SODIUM CHLORIDE 0.9 % IV SOLN
Freq: Once | INTRAVENOUS | Status: AC
  Administered 2019-08-18: 14:00:00 via INTRAVENOUS
  Filled 2019-08-18: qty 250

## 2019-08-21 ENCOUNTER — Inpatient Hospital Stay: Payer: BC Managed Care – PPO

## 2019-08-21 ENCOUNTER — Other Ambulatory Visit: Payer: Self-pay

## 2019-08-21 VITALS — BP 122/89 | HR 89 | Temp 98.0°F | Resp 18

## 2019-08-21 DIAGNOSIS — D509 Iron deficiency anemia, unspecified: Secondary | ICD-10-CM | POA: Diagnosis not present

## 2019-08-21 MED ORDER — SODIUM CHLORIDE 0.9 % IV SOLN
Freq: Once | INTRAVENOUS | Status: AC
  Administered 2019-08-21: 14:00:00 via INTRAVENOUS
  Filled 2019-08-21: qty 250

## 2019-08-21 MED ORDER — IRON SUCROSE 20 MG/ML IV SOLN
200.0000 mg | Freq: Once | INTRAVENOUS | Status: AC
  Administered 2019-08-21: 200 mg via INTRAVENOUS
  Filled 2019-08-21: qty 10

## 2019-08-24 ENCOUNTER — Other Ambulatory Visit: Payer: Self-pay

## 2019-08-25 ENCOUNTER — Other Ambulatory Visit: Payer: Self-pay

## 2019-08-25 ENCOUNTER — Inpatient Hospital Stay: Payer: BC Managed Care – PPO

## 2019-08-25 VITALS — BP 127/78 | HR 88 | Temp 98.3°F | Resp 20

## 2019-08-25 DIAGNOSIS — D509 Iron deficiency anemia, unspecified: Secondary | ICD-10-CM | POA: Diagnosis not present

## 2019-08-25 MED ORDER — SODIUM CHLORIDE 0.9 % IV SOLN
Freq: Once | INTRAVENOUS | Status: AC
  Filled 2019-08-25: qty 250

## 2019-08-25 MED ORDER — IRON SUCROSE 20 MG/ML IV SOLN
200.0000 mg | Freq: Once | INTRAVENOUS | Status: AC
  Administered 2019-08-25: 200 mg via INTRAVENOUS
  Filled 2019-08-25: qty 10

## 2019-08-28 ENCOUNTER — Other Ambulatory Visit: Payer: Self-pay

## 2019-08-28 ENCOUNTER — Inpatient Hospital Stay: Payer: BC Managed Care – PPO

## 2019-08-28 VITALS — BP 125/86 | HR 87 | Resp 18

## 2019-08-28 DIAGNOSIS — D509 Iron deficiency anemia, unspecified: Secondary | ICD-10-CM

## 2019-08-28 MED ORDER — SODIUM CHLORIDE 0.9 % IV SOLN
Freq: Once | INTRAVENOUS | Status: AC
  Filled 2019-08-28: qty 250

## 2019-08-28 MED ORDER — IRON SUCROSE 20 MG/ML IV SOLN
200.0000 mg | Freq: Once | INTRAVENOUS | Status: AC
  Administered 2019-08-28: 200 mg via INTRAVENOUS
  Filled 2019-08-28: qty 10

## 2019-08-31 ENCOUNTER — Inpatient Hospital Stay: Payer: BC Managed Care – PPO

## 2019-08-31 ENCOUNTER — Other Ambulatory Visit: Payer: Self-pay

## 2019-08-31 VITALS — BP 121/75 | HR 84 | Temp 98.1°F | Resp 18

## 2019-08-31 DIAGNOSIS — D509 Iron deficiency anemia, unspecified: Secondary | ICD-10-CM

## 2019-08-31 MED ORDER — IRON SUCROSE 20 MG/ML IV SOLN
200.0000 mg | Freq: Once | INTRAVENOUS | Status: AC
  Administered 2019-08-31: 200 mg via INTRAVENOUS
  Filled 2019-08-31: qty 10

## 2019-08-31 MED ORDER — SODIUM CHLORIDE 0.9 % IV SOLN
Freq: Once | INTRAVENOUS | Status: AC
  Filled 2019-08-31: qty 250

## 2019-09-01 ENCOUNTER — Ambulatory Visit (INDEPENDENT_AMBULATORY_CARE_PROVIDER_SITE_OTHER): Payer: BC Managed Care – PPO | Admitting: Gastroenterology

## 2019-09-01 ENCOUNTER — Encounter: Payer: Self-pay | Admitting: Gastroenterology

## 2019-09-01 ENCOUNTER — Other Ambulatory Visit: Payer: Self-pay

## 2019-09-01 VITALS — BP 127/88 | HR 106 | Temp 98.4°F | Wt 123.5 lb

## 2019-09-01 DIAGNOSIS — K253 Acute gastric ulcer without hemorrhage or perforation: Secondary | ICD-10-CM

## 2019-09-01 MED ORDER — OMEPRAZOLE 40 MG PO CPDR: 40.0000 mg | DELAYED_RELEASE_CAPSULE | Freq: Every day | ORAL | 0 refills | Status: DC

## 2019-09-01 MED ORDER — OMEPRAZOLE 40 MG PO CPDR: 40.0000 mg | DELAYED_RELEASE_CAPSULE | Freq: Two times a day (BID) | ORAL | 0 refills | Status: DC

## 2019-09-01 NOTE — Patient Instructions (Signed)
Take omeprazole 40mg  twice a day for 1 more month. Then go down to taking Omeprazole 40mg  once a day for 1 month then stop.

## 2019-09-02 NOTE — Progress Notes (Signed)
Shirley Antigua, MD [address]  [place]  [place], [place] [address]  Main: [phone]  Fax: [phone]   Primary Care Physician: [facility]   Chief Complaint  Patient presents with  . Anemia    Patient states fatigue is better   . Diarrhea    Had for 3 days     HPI: Shirley [name] is a 43 y.o. female here for follow-up of epigastric pain and iron deficiency anemia with EGD showing nonbleeding gastric ulcer and biopsies negative for H. Pylori.  Colonoscopy November 2020, normal colon.  Patient compliant with PPI and has seen improvement abdominal pain.  No dysphagia.  No weight loss.  No blood in stool.  No melena.   Current Outpatient Medications  Medication Sig Dispense Refill  . acetaminophen (TYLENOL) 500 MG tablet Take 1,000 mg by mouth every 6 (six) hours as needed for mild pain or headache.    . albuterol (PROVENTIL HFA;VENTOLIN HFA) 108 (90 Base) MCG/ACT inhaler Inhale 2 puffs into the lungs every 6 (six) hours as needed for wheezing or shortness of breath.    Marland Kitchen amLODipine (NORVASC) 2.5 MG tablet Take 2.5 mg by mouth daily.  1  . Ascorbic Acid (VITAMIN C PO) Take by mouth daily.    . Multiple Vitamins-Minerals (WOMENS DAILY FORMULA PO) Take 1 tablet by mouth daily.    Marland Kitchen omeprazole (PRILOSEC) 40 MG capsule Take 1 capsule (40 mg total) by mouth 2 (two) times daily. 60 capsule 0  . ondansetron (ZOFRAN-ODT) 4 MG disintegrating tablet Take 4 mg by mouth every 8 (eight) hours as needed.    . polyethylene glycol (MIRALAX / GLYCOLAX) packet Take 17 g by mouth daily as needed for mild constipation.     Derrill Memo ON 10/02/2019] omeprazole (PRILOSEC) 40 MG capsule Take 1 capsule (40 mg total) by mouth daily. 30 capsule 0   Current Facility-Administered Medications  Medication Dose Route Frequency Provider Last Rate Last Admin  . 0.9 %  sodium chloride infusion  500 mL Intravenous Once Doran Stabler, MD        Allergies as of 09/01/2019 -  Review Complete 09/01/2019  Allergen Reaction Noted  . Lisinopril Anaphylaxis and Swelling 05/09/2017  . Aspirin Nausea Only 11/17/2009  . Ibuprofen Nausea Only 11/17/2009  . Other Nausea Only and Other (See Comments) 09/10/2018    ROS:  General: Negative for anorexia, weight loss, fever, chills, fatigue, weakness. ENT: Negative for hoarseness, difficulty swallowing , nasal congestion. CV: Negative for chest pain, angina, palpitations, dyspnea on exertion, peripheral edema.  Respiratory: Negative for dyspnea at rest, dyspnea on exertion, cough, sputum, wheezing.  GI: See history of present illness. GU:  Negative for dysuria, hematuria, urinary incontinence, urinary frequency, nocturnal urination.  Endo: Negative for unusual weight change.    Physical Examination:   BP 127/88 (BP Location: Left Arm, Patient Position: Sitting, Cuff Size: Normal)   Pulse (!) 106   Temp 98.4 F (36.9 C) (Oral)   Wt 123 lb 8 oz (56 kg)   BMI 22.59 kg/m   General: Well-nourished, well-developed in no acute distress.  Eyes: No icterus. Conjunctivae pink. Mouth: Oropharyngeal mucosa moist and pink , no lesions erythema or exudate. Neck: Supple, Trachea midline Abdomen: Bowel sounds are normal, nontender, nondistended, no hepatosplenomegaly or masses, no abdominal bruits or hernia , no rebound or guarding.   Extremities: No lower extremity edema. No clubbing or deformities. Neuro: Alert and oriented x 3.  Grossly intact.  Skin: Warm and dry, no jaundice.   Psych: Alert and cooperative, normal mood and affect.   Labs: CMP     Component Value Date/Time   NA 135 07/28/2019 1538   K 2.9 (L) 07/28/2019 1538   CL 104 07/28/2019 1538   CO2 23 07/28/2019 1538   GLUCOSE 95 07/28/2019 1538   BUN 8 07/28/2019 1538   CREATININE 0.55 07/28/2019 1538   CALCIUM 9.1 07/28/2019 1538   PROT 7.7 07/28/2019 1538   ALBUMIN 4.4 07/28/2019 1538   AST 17 07/28/2019 1538   ALT 13 07/28/2019 1538   ALKPHOS 39  07/28/2019 1538   BILITOT 0.4 07/28/2019 1538   GFRNONAA >60 07/28/2019 1538   GFRAA >60 07/28/2019 1538   Lab Results  Component Value Date   WBC 4.9 07/28/2019   HGB 8.8 (L) 07/28/2019   HCT 28.1 (L) 07/28/2019   MCV 87.0 07/28/2019   PLT 386 07/28/2019    Imaging Studies: No results found.  Assessment and Plan:   [name] is a 43 y.o. y/o female with epigastric pain and iron deficiency anemia and found to have clean-based ulcer in the stomach on recent EGD  Complete PPI therapy, with twice daily therapy for 2 months and then once daily for 1 month and then stop  (Risks of PPI use were discussed with patient including bone loss, C. Diff diarrhea, pneumonia, infections, CKD, electrolyte abnormalities.  Pt. Verbalizes understanding and chooses to continue the medication.)  Continue follow-up with hematology as scheduled and if iron deficiency improves as expected after treatment of gastric ulcer, no further testing needed.  However, if it reoccurs, small bowel capsule study can be done at that time.  No alarm symptoms present at this time     Dr Melodie Bouillon

## 2019-09-09 ENCOUNTER — Other Ambulatory Visit: Payer: Self-pay

## 2019-09-09 ENCOUNTER — Inpatient Hospital Stay: Payer: BC Managed Care – PPO

## 2019-09-09 DIAGNOSIS — D509 Iron deficiency anemia, unspecified: Secondary | ICD-10-CM

## 2019-09-09 LAB — CBC WITH DIFFERENTIAL/PLATELET
Abs Immature Granulocytes: 0.02 10*3/uL (ref 0.00–0.07)
Basophils Absolute: 0 10*3/uL (ref 0.0–0.1)
Basophils Relative: 0 %
Eosinophils Absolute: 0.3 10*3/uL (ref 0.0–0.5)
Eosinophils Relative: 6 %
HCT: 32.5 % — ABNORMAL LOW (ref 36.0–46.0)
Hemoglobin: 10 g/dL — ABNORMAL LOW (ref 12.0–15.0)
Immature Granulocytes: 0 %
Lymphocytes Relative: 46 %
Lymphs Abs: 2.4 10*3/uL (ref 0.7–4.0)
MCH: 29.6 pg (ref 26.0–34.0)
MCHC: 30.8 g/dL (ref 30.0–36.0)
MCV: 96.2 fL (ref 80.0–100.0)
Monocytes Absolute: 0.2 10*3/uL (ref 0.1–1.0)
Monocytes Relative: 4 %
Neutro Abs: 2.4 10*3/uL (ref 1.7–7.7)
Neutrophils Relative %: 44 %
Platelets: 295 10*3/uL (ref 150–400)
RBC: 3.38 MIL/uL — ABNORMAL LOW (ref 3.87–5.11)
RDW: 21.7 % — ABNORMAL HIGH (ref 11.5–15.5)
WBC: 5.4 10*3/uL (ref 4.0–10.5)
nRBC: 0 % (ref 0.0–0.2)

## 2019-09-24 DIAGNOSIS — I10 Essential (primary) hypertension: Secondary | ICD-10-CM | POA: Diagnosis not present

## 2019-09-24 DIAGNOSIS — F17210 Nicotine dependence, cigarettes, uncomplicated: Secondary | ICD-10-CM | POA: Diagnosis not present

## 2019-09-24 DIAGNOSIS — R569 Unspecified convulsions: Secondary | ICD-10-CM | POA: Diagnosis not present

## 2019-09-24 DIAGNOSIS — S73101D Unspecified sprain of right hip, subsequent encounter: Secondary | ICD-10-CM | POA: Diagnosis not present

## 2019-09-24 DIAGNOSIS — D509 Iron deficiency anemia, unspecified: Secondary | ICD-10-CM | POA: Diagnosis not present

## 2019-10-09 ENCOUNTER — Inpatient Hospital Stay: Payer: BC Managed Care – PPO

## 2019-10-12 ENCOUNTER — Inpatient Hospital Stay: Payer: BC Managed Care – PPO | Admitting: Oncology

## 2019-10-13 ENCOUNTER — Other Ambulatory Visit: Payer: Self-pay

## 2019-10-14 ENCOUNTER — Other Ambulatory Visit: Payer: Self-pay

## 2019-10-14 ENCOUNTER — Inpatient Hospital Stay: Payer: BC Managed Care – PPO | Attending: Oncology

## 2019-10-14 DIAGNOSIS — D649 Anemia, unspecified: Secondary | ICD-10-CM | POA: Insufficient documentation

## 2019-10-14 DIAGNOSIS — D509 Iron deficiency anemia, unspecified: Secondary | ICD-10-CM | POA: Diagnosis not present

## 2019-10-14 LAB — IRON AND TIBC
Iron: 14 ug/dL — ABNORMAL LOW (ref 28–170)
Saturation Ratios: 5 % — ABNORMAL LOW (ref 10.4–31.8)
TIBC: 267 ug/dL (ref 250–450)
UIBC: 253 ug/dL

## 2019-10-14 LAB — CBC WITH DIFFERENTIAL/PLATELET
Abs Immature Granulocytes: 0.02 10*3/uL (ref 0.00–0.07)
Basophils Absolute: 0 10*3/uL (ref 0.0–0.1)
Basophils Relative: 0 %
Eosinophils Absolute: 0.1 10*3/uL (ref 0.0–0.5)
Eosinophils Relative: 2 %
HCT: 31.2 % — ABNORMAL LOW (ref 36.0–46.0)
Hemoglobin: 9.9 g/dL — ABNORMAL LOW (ref 12.0–15.0)
Immature Granulocytes: 0 %
Lymphocytes Relative: 24 %
Lymphs Abs: 1.7 10*3/uL (ref 0.7–4.0)
MCH: 31.7 pg (ref 26.0–34.0)
MCHC: 31.7 g/dL (ref 30.0–36.0)
MCV: 100 fL (ref 80.0–100.0)
Monocytes Absolute: 0.4 10*3/uL (ref 0.1–1.0)
Monocytes Relative: 6 %
Neutro Abs: 4.6 10*3/uL (ref 1.7–7.7)
Neutrophils Relative %: 68 %
Platelets: 291 10*3/uL (ref 150–400)
RBC: 3.12 MIL/uL — ABNORMAL LOW (ref 3.87–5.11)
RDW: 18.2 % — ABNORMAL HIGH (ref 11.5–15.5)
WBC: 6.9 10*3/uL (ref 4.0–10.5)
nRBC: 0 % (ref 0.0–0.2)

## 2019-10-14 LAB — FERRITIN: Ferritin: 64 ng/mL (ref 11–307)

## 2019-10-15 ENCOUNTER — Encounter: Payer: Self-pay | Admitting: Oncology

## 2019-10-15 ENCOUNTER — Other Ambulatory Visit: Payer: Self-pay

## 2019-10-15 NOTE — Progress Notes (Signed)
Patient stated that for the past two weeks she has had pain on her right hip pain that is not letting her sleep well at bedtime.

## 2019-10-16 ENCOUNTER — Other Ambulatory Visit: Payer: Self-pay | Admitting: *Deleted

## 2019-10-16 ENCOUNTER — Ambulatory Visit: Payer: BC Managed Care – PPO

## 2019-10-16 ENCOUNTER — Inpatient Hospital Stay (HOSPITAL_BASED_OUTPATIENT_CLINIC_OR_DEPARTMENT_OTHER): Payer: BC Managed Care – PPO | Admitting: Oncology

## 2019-10-16 ENCOUNTER — Telehealth: Payer: Self-pay | Admitting: *Deleted

## 2019-10-16 DIAGNOSIS — D649 Anemia, unspecified: Secondary | ICD-10-CM | POA: Diagnosis not present

## 2019-10-16 DIAGNOSIS — D509 Iron deficiency anemia, unspecified: Secondary | ICD-10-CM

## 2019-10-16 NOTE — Telephone Encounter (Signed)
Called pt to let her know that bone marrow bx will be on 2/22 and arrival at 7 am for a 8 am procedure. NPO after midnight before the procedure. She will need to have a driver for the procedure. Someone who works with the radiologist will call her 1-2 days prior to procedure to go over details again. Pt agreeable to the plan

## 2019-10-19 ENCOUNTER — Inpatient Hospital Stay: Payer: BC Managed Care – PPO | Admitting: Oncology

## 2019-10-19 NOTE — Progress Notes (Signed)
I connected with Shirley [name] on 10/19/19 at  9:30 AM EST by video enabled telemedicine visit and verified that I am speaking with the correct person using two identifiers.   I discussed the limitations, risks, security and privacy concerns of performing an evaluation and management service by telemedicine and the availability of in-person appointments. I also discussed with the patient that there may be a patient responsible charge related to this service. The patient expressed understanding and agreed to proceed.  Other persons participating in the visit and their role in the encounter:  none  Patient's location:  home Provider's location:  work  Risk analyst Complaint: Discuss results of blood work  Diagnosis: Normocytic anemia of unclear etiology  History of present illness: Patient is a 44 year old African-American female referred to [place] for normocytic anemia. Her most recent CBC from 07/08/2019 showed white count of 6.1, H&H of 9.9/31.1 and a platelet count of 402. Looking back at her prior CBCs patient had a normal hemoglobin of around 12 between 20 17-20 18. Since November 2018 her hemoglobin has been mostly fluctuating around 10 and then drifted down to 9 more recently in October 2020. Iron studies on 07/21/2019 showed a low iron saturation of 10% with a normal TIBC of 332 patient also underwent EGD and colonoscopy by Dr. Dalia Heading in November 2020 which did not show any evidence of bleeding and normal ferritin of 45.  Results of blood work from 07/28/2019 were as follows: CBC showed white count of 4.9, H&H of 8.8/28.1 with an MCV of 87 and a platelet count of 386.  CMP showed hypokalemia with a potassium of 2.9.  Folate was normal.  Reticulocyte count was low at 0.9% indicating hypoproliferative anemia.  Haptoglobin and TSH was normal.  Serum free light chain ratio and myeloma panel was unremarkable.  And he was normal, serum copper, B6 and B12 levels were normal.   Interval history : patient  feels well. Denies any complaints at this time   Review of Systems  Constitutional: Negative for chills, fever, malaise/fatigue and weight loss.  HENT: Negative for congestion, ear discharge and nosebleeds.   Eyes: Negative for blurred vision.  Respiratory: Negative for cough, hemoptysis, sputum production, shortness of breath and wheezing.   Cardiovascular: Negative for chest pain, palpitations, orthopnea and claudication.  Gastrointestinal: Negative for abdominal pain, blood in stool, constipation, diarrhea, heartburn, melena, nausea and vomiting.  Genitourinary: Negative for dysuria, flank pain, frequency, hematuria and urgency.  Musculoskeletal: Negative for back pain, joint pain and myalgias.  Skin: Negative for rash.  Neurological: Negative for dizziness, tingling, focal weakness, seizures, weakness and headaches.  Endo/Heme/Allergies: Does not bruise/bleed easily.  Psychiatric/Behavioral: Negative for depression and suicidal ideas. The patient does not have insomnia.     Allergies  Allergen Reactions  . Lisinopril Anaphylaxis and Swelling    Feet, hands, and throat became swollen  . Aspirin Nausea Only    Makes stomach burn  . Ibuprofen Nausea Only    Makes stomach burn  . Other Nausea Only and Other (See Comments)    No spicy foods = Indigestion, also    Past Medical History:  Diagnosis Date  . Anemia   . Bacteremia   . Blood dyscrasia    has to apply a lot of pressure to stop bleeding  . GERD (gastroesophageal reflux disease)   . Heart murmur    with pregnancy  . History of hiatal hernia   . Hypertension    4-5 years ago, no meds  .  Kidney infection    patient states I might have a kidney infection  . Migraine    migraines 1-2x/month  . Pyelonephritis   . Seizure ([place]) 05/09/2017   due to low potassium  . Wears dentures    partial upper and lower    Past Surgical History:  Procedure Laterality Date  . ABDOMINAL HERNIA REPAIR    . CESAREAN SECTION      x 4  . CESAREAN SECTION     x 4  . COLONOSCOPY WITH PROPOFOL N/A 07/24/2019   Procedure: COLONOSCOPY WITH PROPOFOL;  Surgeon: Virgel Manifold, MD;  Location: [facility];  Service: Endoscopy;  Laterality: N/A;  . ENDOMETRIAL ABLATION  07/31/2016  . ESOPHAGOGASTRODUODENOSCOPY (EGD) WITH PROPOFOL N/A 07/24/2019   Procedure: ESOPHAGOGASTRODUODENOSCOPY (EGD) WITH PROPOFOL;  Surgeon: Virgel Manifold, MD;  Location: [place];  Service: Endoscopy;  Laterality: N/A;  . HYSTEROSCOPY  07/31/2016   Procedure: HYSTEROSCOPY WITH HYDROTHERMAL ABLATION;  Surgeon: Aletha Halim, MD;  Location: [facility];  Service: Gynecology;;    Social History   Socioeconomic History  . Marital status: Married    Spouse name: Not on file  . Number of children: 4  . Years of education: Not on file  . Highest education level: Not on file  Occupational History  . Not on file  Tobacco Use  . Smoking status: Current Every Day Smoker    Packs/day: 0.50    Types: Cigarettes  . Smokeless tobacco: Never Used  . Tobacco comment: 1 black and mild/day  Substance and Sexual Activity  . Alcohol use: Yes    Comment: occasionally  . Drug use: Yes    Types: Marijuana    Comment: last  time: 07/18/19  . Sexual activity: Yes    Birth control/protection: None  Other Topics Concern  . Not on file  Social History Narrative  . Not on file   Social Determinants of Health   Financial Resource Strain:   . Difficulty of Paying Living Expenses: Not on file  Food Insecurity:   . Worried About Charity fundraiser in the Last Year: Not on file  . Ran Out of Food in the Last Year: Not on file  Transportation Needs:   . Lack of Transportation (Medical): Not on file  . Lack of Transportation (Non-Medical): Not on file  Physical Activity:   . Days of Exercise per Week: Not on file  . Minutes of Exercise per Session: Not on file  Stress:   . Feeling of Stress : Not on file  Social Connections:   .  Frequency of Communication with Friends and Family: Not on file  . Frequency of Social Gatherings with Friends and Family: Not on file  . Attends Religious Services: Not on file  . Active Member of Clubs or Organizations: Not on file  . Attends Archivist Meetings: Not on file  . Marital Status: Not on file  Intimate Partner Violence:   . Fear of Current or Ex-Partner: Not on file  . Emotionally Abused: Not on file  . Physically Abused: Not on file  . Sexually Abused: Not on file    Family History  Problem Relation Age of Onset  . Hypertension Mother   . Colon cancer Father 101  . Stroke Brother      Current Outpatient Medications:  .  acetaminophen (TYLENOL) 500 MG tablet, Take 1,000 mg by mouth every 6 (six) hours as needed for mild pain or headache., Disp: ,  Rfl:  .  albuterol (PROVENTIL HFA;VENTOLIN HFA) 108 (90 Base) MCG/ACT inhaler, Inhale 2 puffs into the lungs every 6 (six) hours as needed for wheezing or shortness of breath., Disp: , Rfl:  .  amLODipine (NORVASC) 2.5 MG tablet, Take 2.5 mg by mouth daily., Disp: , Rfl: 1 .  Ascorbic Acid (VITAMIN C PO), Take by mouth daily., Disp: , Rfl:  .  magnesium oxide (MAG-OX) 400 MG tablet, Take 400 mg by mouth 2 (two) times daily., Disp: , Rfl:  .  Multiple Vitamins-Minerals (WOMENS DAILY FORMULA PO), Take 1 tablet by mouth daily., Disp: , Rfl:  .  omeprazole (PRILOSEC) 40 MG capsule, Take 1 capsule (40 mg total) by mouth 2 (two) times daily., Disp: 60 capsule, Rfl: 0 .  ondansetron (ZOFRAN-ODT) 4 MG disintegrating tablet, Take 4 mg by mouth every 8 (eight) hours as needed., Disp: , Rfl:  .  polyethylene glycol (MIRALAX / GLYCOLAX) packet, Take 17 g by mouth daily as needed for mild constipation. , Disp: , Rfl:   Current Facility-Administered Medications:  .  0.9 %  sodium chloride infusion, 500 mL, Intravenous, Once, Danis, Estill Cotta III, MD  No results found.  No images are attached to the encounter.   CMP Latest Ref  Rng & Units 07/28/2019  Glucose 70 - 99 mg/dL 95  BUN 6 - 20 mg/dL 8  Creatinine 0.44 - 1.00 mg/dL 0.55  Sodium 135 - 145 mmol/L 135  Potassium 3.5 - 5.1 mmol/L 2.9(L)  Chloride 98 - 111 mmol/L 104  CO2 22 - 32 mmol/L 23  Calcium 8.9 - 10.3 mg/dL 9.1  Total Protein 6.5 - 8.1 g/dL 7.7  Total Bilirubin 0.3 - 1.2 mg/dL 0.4  Alkaline Phos 38 - 126 U/L 39  AST 15 - 41 U/L 17  ALT 0 - 44 U/L 13   CBC Latest Ref Rng & Units 10/14/2019  WBC 4.0 - 10.5 K/uL 6.9  Hemoglobin 12.0 - 15.0 g/dL 9.9(L)  Hematocrit 36.0 - 46.0 % 31.2(L)  Platelets 150 - 400 K/uL 291     Observation/objective: appears in no acute distress over video visit today. Breathing non labored  Assessment and plan:Patient is a 43 yr old female with borderline microcytic/ normocytic anemia with some component of iron deficiency  Patient had a ferritin of 45 and low iron saturation of 10% in nov 2020 she received 5 does of venofer. Despite that her hb shows no significant improvement and remains at 9.9. her anemia therefore cannot entirely be explained by iron deficiency. Her anemia is moderate and has trended down from 11 in 2019. I would therefore like to proceed with bone marrow biopsy to ascertain if there is any other cause for her anemia.  Discussed possible risks of bone marrow biopsy including all but not limited to pain and bleeding. Patient understands and agrees to proceed as planned    Follow-up instructions: I will see her in 2-3 weeks to discuss bone marrow biopsy results  I discussed the assessment and treatment plan with the patient. The patient was provided an opportunity to ask questions and all were answered. The patient agreed with the plan and demonstrated an understanding of the instructions.   The patient was advised to call back or seek an in-person evaluation if the symptoms worsen or if the condition fails to improve as anticipated.   Visit Diagnosis: 1. Normocytic anemia   2. Iron deficiency  anemia, unspecified iron deficiency anemia type     Dr. Randa Evens,  MD, MPH [place] [facility] Tel- [phone] 10/19/2019 8:52 AM

## 2019-10-29 ENCOUNTER — Other Ambulatory Visit: Payer: Self-pay | Admitting: Radiology

## 2019-10-30 ENCOUNTER — Other Ambulatory Visit: Payer: Self-pay | Admitting: Student

## 2019-11-02 ENCOUNTER — Other Ambulatory Visit: Payer: Self-pay

## 2019-11-02 ENCOUNTER — Ambulatory Visit
Admission: RE | Admit: 2019-11-02 | Discharge: 2019-11-02 | Disposition: A | Discharge status: Home / Self Care | Payer: BC Managed Care – PPO | Source: Ambulatory Visit | Attending: Oncology | Admitting: Oncology

## 2019-11-02 ENCOUNTER — Ambulatory Visit: Payer: BC Managed Care – PPO

## 2019-11-02 DIAGNOSIS — D649 Anemia, unspecified: Secondary | ICD-10-CM | POA: Insufficient documentation

## 2019-11-02 LAB — CBC WITH DIFFERENTIAL/PLATELET
Abs Immature Granulocytes: 0.01 10*3/uL (ref 0.00–0.07)
Basophils Absolute: 0 10*3/uL (ref 0.0–0.1)
Basophils Relative: 0 %
Eosinophils Absolute: 0.3 10*3/uL (ref 0.0–0.5)
Eosinophils Relative: 6 %
HCT: 33.4 % — ABNORMAL LOW (ref 36.0–46.0)
Hemoglobin: 10.9 g/dL — ABNORMAL LOW (ref 12.0–15.0)
Immature Granulocytes: 0 %
Lymphocytes Relative: 32 %
Lymphs Abs: 1.5 10*3/uL (ref 0.7–4.0)
MCH: 31.9 pg (ref 26.0–34.0)
MCHC: 32.6 g/dL (ref 30.0–36.0)
MCV: 97.7 fL (ref 80.0–100.0)
Monocytes Absolute: 0.2 10*3/uL (ref 0.1–1.0)
Monocytes Relative: 5 %
Neutro Abs: 2.6 10*3/uL (ref 1.7–7.7)
Neutrophils Relative %: 57 %
Platelets: 305 10*3/uL (ref 150–400)
RBC: 3.42 MIL/uL — ABNORMAL LOW (ref 3.87–5.11)
RDW: 16.1 % — ABNORMAL HIGH (ref 11.5–15.5)
WBC: 4.6 10*3/uL (ref 4.0–10.5)
nRBC: 0 % (ref 0.0–0.2)

## 2019-11-02 IMAGING — CT CT BIOPSY AND ASPIRATION BONE MARROW
2 series · 10 of 14 positions shown, 12 images · non-contrast
Comparison: none

CLINICAL DATA: Anemia of unknown etiology

EXAM:
CT GUIDED DEEP ILIAC BONE ASPIRATION AND CORE BIOPSY
TECHNIQUE: Patient was placed prone on the CT gantry and limited axial scans
through the pelvis were obtained. Appropriate skin entry site was
identified. Skin site was marked, prepped with chlorhexidine, draped
in usual sterile fashion, and infiltrated locally with 1% lidocaine.

[Series 2: i-spiral 5.0 b30f · axial · 0.56mm/px · z∈[-166,-102]mm · 7 of 25 slices shown, 9 images]
[im 4/25  soft-tissue]
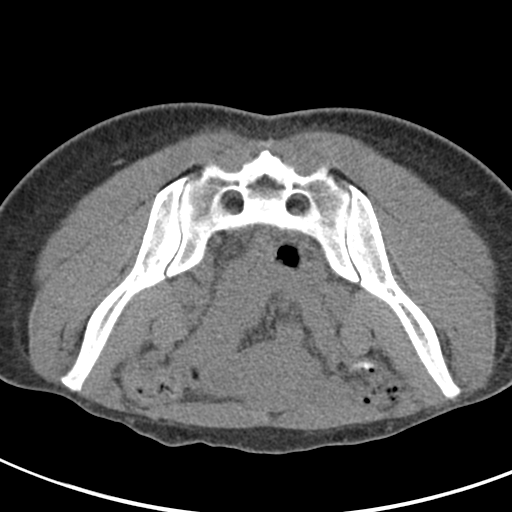
[im 4/25  bone]
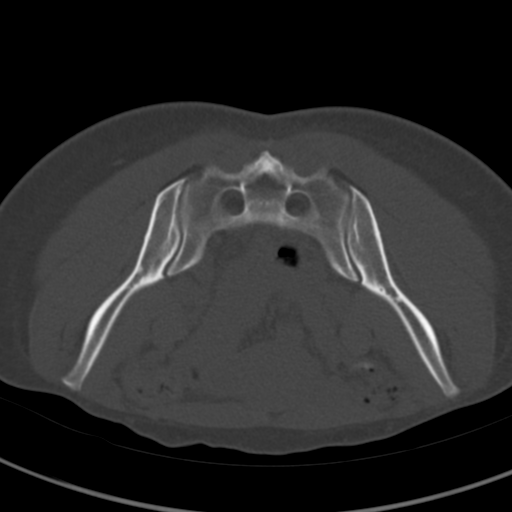
[im 7/25  bone]
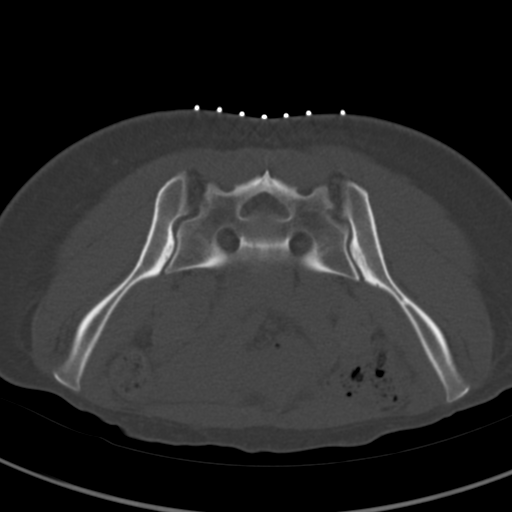
[im 10/25  bone]
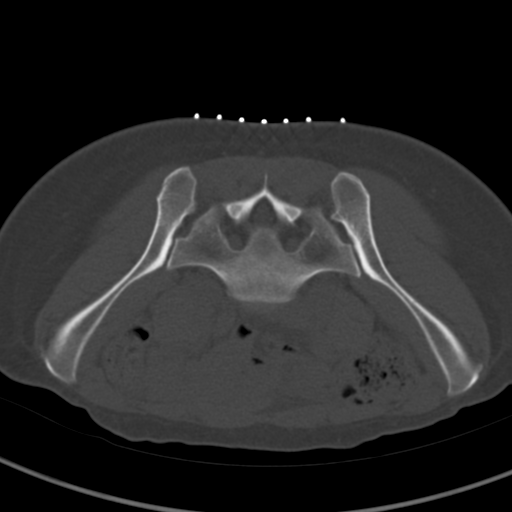
[im 13/25  bone]
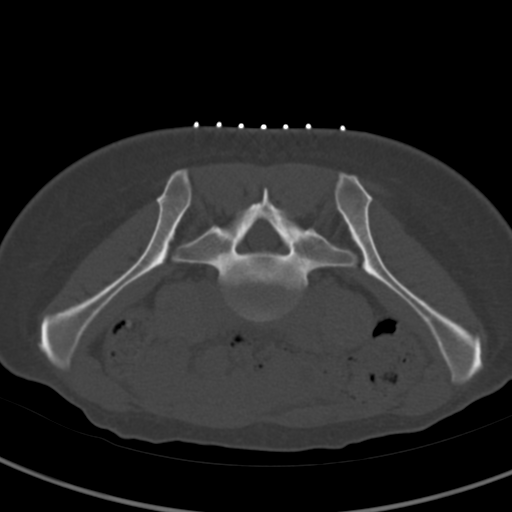
[im 16/25  soft-tissue]
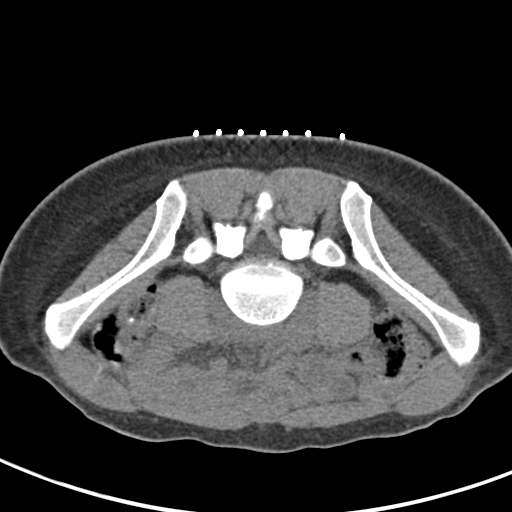
[im 16/25  bone]
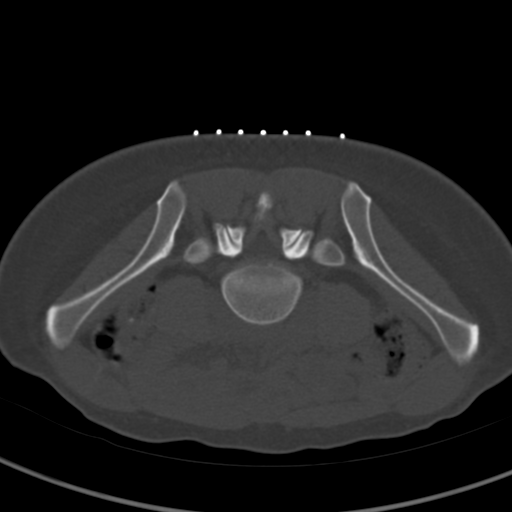
[im 19/25  bone]
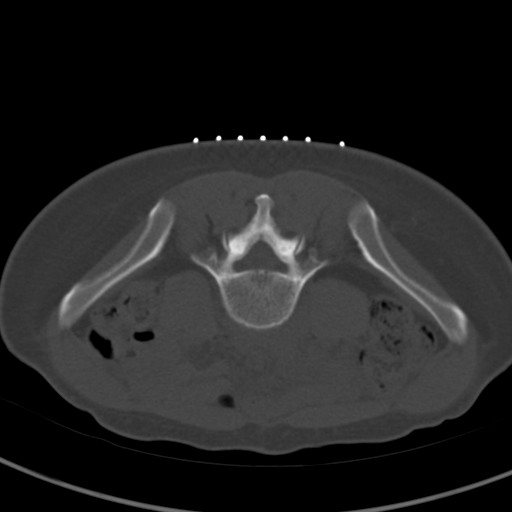
[im 22/25  bone]
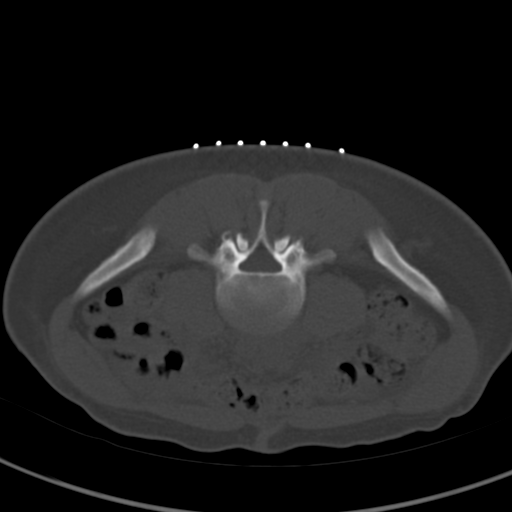

[Series 3: i-sequence 4.8 b30s · axial · 0.56mm/px · z∈[-156,-147]mm · 3 of 12 slices shown]
[im 3/12  bone]
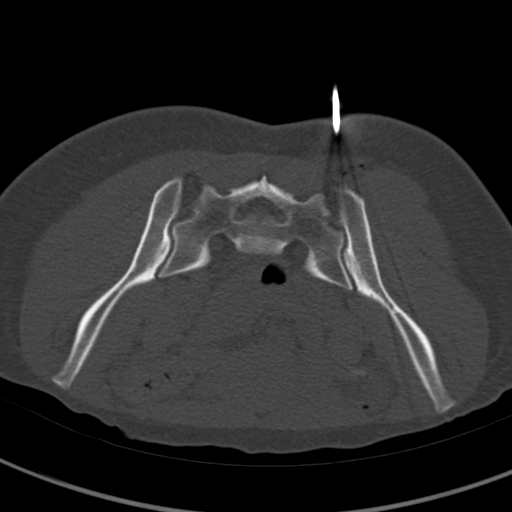
[im 6/12  bone]
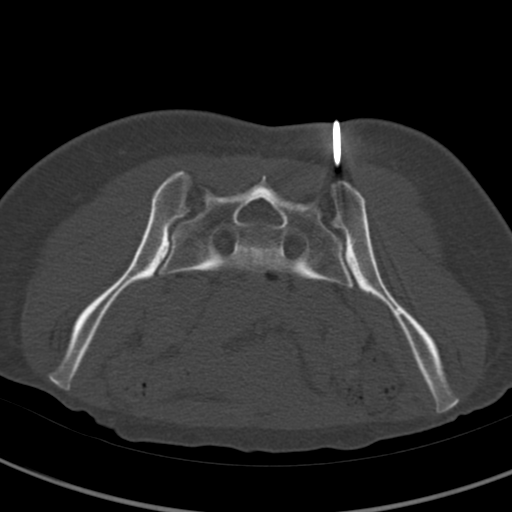
[im 9/12  bone]
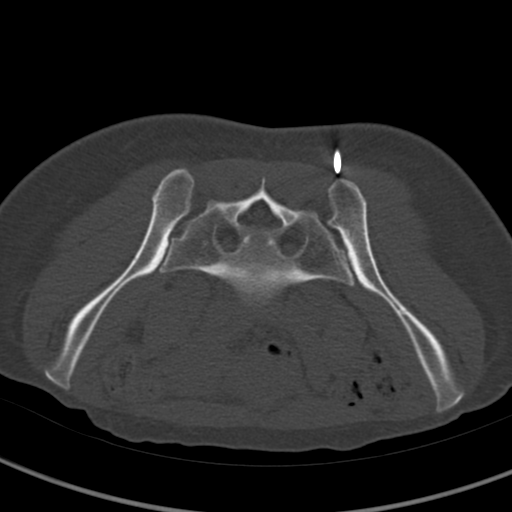

[10 of 14 positions shown; findings below may reference images not displayed]

Intravenous Fentanyl [V8] and Versed 2mg were administered as
conscious sedation during continuous monitoring of the patient's
level of consciousness and physiological / cardiorespiratory status
by the radiology RN, with a total moderate sedation time of 10
minutes.

Under CT fluoroscopic guidance an 11-gauge Cook trocar bone needle
was advanced into the right iliac bone just lateral to the
sacroiliac joint. Once needle tip position was confirmed, core and
aspiration samples were obtained, submitted to pathology. Post
procedure scans show no hematoma or fracture. Patient tolerated
procedure well.

COMPLICATIONS:
COMPLICATIONS
none
IMPRESSION: 1. Technically successful CT guided right iliac bone core and
aspiration biopsy.

## 2019-11-02 MED ORDER — HEPARIN SOD (PORK) LOCK FLUSH 100 UNIT/ML IV SOLN
INTRAVENOUS | Status: AC
  Filled 2019-11-02: qty 5

## 2019-11-02 MED ORDER — HYDROCODONE-ACETAMINOPHEN 5-325 MG PO TABS
ORAL_TABLET | ORAL | Status: AC
  Filled 2019-11-02: qty 1

## 2019-11-02 MED ORDER — FENTANYL CITRATE (PF) 100 MCG/2ML IJ SOLN
INTRAMUSCULAR | Status: AC
  Filled 2019-11-02: qty 2

## 2019-11-02 MED ORDER — HYDROCODONE-ACETAMINOPHEN 5-325 MG PO TABS
1.0000 | ORAL_TABLET | ORAL | Status: DC | PRN
  Administered 2019-11-02: 09:00:00 1 via ORAL
  Filled 2019-11-02: qty 2

## 2019-11-02 MED ORDER — MIDAZOLAM HCL 2 MG/2ML IJ SOLN
INTRAMUSCULAR | Status: AC
  Filled 2019-11-02: qty 2

## 2019-11-02 MED ORDER — SODIUM CHLORIDE 0.9 % IV SOLN: INTRAVENOUS | Status: DC

## 2019-11-02 MED ORDER — MIDAZOLAM HCL 2 MG/2ML IJ SOLN
INTRAMUSCULAR | Status: AC | PRN
  Administered 2019-11-02 (×2): 1 mg via INTRAVENOUS

## 2019-11-02 MED ORDER — FENTANYL CITRATE (PF) 100 MCG/2ML IJ SOLN
INTRAMUSCULAR | Status: AC | PRN
  Administered 2019-11-02 (×2): 50 ug via INTRAVENOUS

## 2019-11-02 NOTE — Procedures (Signed)
  Procedure: CT bone marrow biopsy right iliac EBL:   minimal Complications:  none immediate  See full dictation in [facility].  Dillard Cannon MD Main # [phone] Pager  [phone]

## 2019-11-02 NOTE — Discharge Instructions (Signed)
Bone Marrow Aspiration and Bone Marrow Biopsy, Adult, Care After This sheet gives you information about how to care for yourself after your procedure. Your health care provider may also give you more specific instructions. If you have problems or questions, contact your health care provider. What can I expect after the procedure? After the procedure, it is common to have:  Mild pain and tenderness.  Swelling.  Bruising. Follow these instructions at home: Puncture site care   Follow instructions from your health care provider about how to take care of the puncture site. Make sure you: ? Wash your hands with soap and water before and after you change your bandage (dressing). If soap and water are not available, use hand sanitizer. ? Change your dressing as told by your health care provider.  Check your puncture site every day for signs of infection. Check for: ? More redness, swelling, or pain. ? Fluid or blood. ? Warmth. ? Pus or a bad smell. Activity  Return to your normal activities as told by your health care provider. Ask your health care provider what activities are safe for you.  Do not lift anything that is heavier than 10 lb (4.5 kg), or the limit that you are told, until your health care provider says that it is safe.  Do not drive for 24 hours if you were given a sedative during your procedure. General instructions   Take over-the-counter and prescription medicines only as told by your health care provider.  Do not take baths, swim, or use a hot tub until your health care provider approves. Ask your health care provider if you may take showers. You may only be allowed to take sponge baths.  If directed, put ice on the affected area. To do this: ? Put ice in a plastic bag. ? Place a towel between your skin and the bag. ? Leave the ice on for 20 minutes, 2-3 times a day.  Keep all follow-up visits as told by your health care provider. This is important. Contact a  health care provider if:  Your pain is not controlled with medicine.  You have a fever.  You have more redness, swelling, or pain around the puncture site.  You have fluid or blood coming from the puncture site.  Your puncture site feels warm to the touch.  You have pus or a bad smell coming from the puncture site. Summary  After the procedure, it is common to have mild pain, tenderness, swelling, and bruising.  Follow instructions from your health care provider about how to take care of the puncture site and what activities are safe for you.  Take over-the-counter and prescription medicines only as told by your health care provider.  Contact a health care provider if you have any signs of infection, such as fluid or blood coming from the puncture site. This information is not intended to replace advice given to you by your health care provider. Make sure you discuss any questions you have with your health care provider. Document Revised: 01/13/2019 Document Reviewed: 01/13/2019 Elsevier Patient Education  2020 [employer]  

## 2019-11-04 LAB — SURGICAL PATHOLOGY

## 2019-11-08 ENCOUNTER — Other Ambulatory Visit: Payer: Self-pay | Admitting: Gastroenterology

## 2019-11-09 ENCOUNTER — Encounter (HOSPITAL_COMMUNITY): Payer: Self-pay | Admitting: Oncology

## 2019-11-09 ENCOUNTER — Inpatient Hospital Stay: Payer: BC Managed Care – PPO | Admitting: Oncology

## 2019-11-09 NOTE — Telephone Encounter (Signed)
Last office visit 09/01/2019 Gastric ulcer  Last refill 09/01/19 0 refills

## 2019-11-10 ENCOUNTER — Encounter (HOSPITAL_COMMUNITY): Payer: Self-pay | Admitting: Oncology

## 2019-11-12 DIAGNOSIS — N390 Urinary tract infection, site not specified: Secondary | ICD-10-CM | POA: Diagnosis not present

## 2019-11-12 DIAGNOSIS — K253 Acute gastric ulcer without hemorrhage or perforation: Secondary | ICD-10-CM | POA: Diagnosis not present

## 2019-11-12 DIAGNOSIS — G5601 Carpal tunnel syndrome, right upper limb: Secondary | ICD-10-CM | POA: Diagnosis not present

## 2019-11-12 DIAGNOSIS — I10 Essential (primary) hypertension: Secondary | ICD-10-CM | POA: Diagnosis not present

## 2019-11-13 ENCOUNTER — Inpatient Hospital Stay: Payer: BC Managed Care – PPO | Attending: Oncology | Admitting: Oncology

## 2019-11-13 ENCOUNTER — Other Ambulatory Visit: Payer: Self-pay

## 2019-11-13 ENCOUNTER — Encounter: Payer: Self-pay | Admitting: Oncology

## 2019-11-13 DIAGNOSIS — D649 Anemia, unspecified: Secondary | ICD-10-CM

## 2019-11-13 NOTE — Progress Notes (Signed)
Patient stated that she had been feeling nauseated and that it wakes her up at nighttime sometimes. Patient also stated that she's been with a poor appetite.

## 2019-11-13 NOTE — Progress Notes (Signed)
I connected with Marge Duncans on 11/13/19 at  9:15 AM EST by video enabled telemedicine visit and verified that I am speaking with the correct person using two identifiers.   I discussed the limitations, risks, security and privacy concerns of performing an evaluation and management service by telemedicine and the availability of in-person appointments. I also discussed with the patient that there may be a patient responsible charge related to this service. The patient expressed understanding and agreed to proceed.  Other persons participating in the visit and their role in the encounter:  none  Patient's location:  home Provider's location:  work  Risk analyst Complaint: Discuss bone marrow biopsy results  History of present illness: Patient is a 44 year old African-American female referred to [place] for normocytic anemia. Her most recent CBC from 07/08/2019 showed white count of 6.1, H&H of 9.9/31.1 and a platelet count of 402. Looking back at her prior CBCs patient had a normal hemoglobin of around 12 between 20 17-20 18. Since November 2018 her hemoglobin has been mostly fluctuating around 10 and then drifted down to 9 more recently in October 2020. Iron studies on 07/21/2019 showed a low iron saturation of 10% with a normal TIBC of 332 patient also underwent EGD and colonoscopy by Dr. Dalia Heading in November 2020 which did not show any evidence of bleeding and normal ferritin of 45.  Results of blood work from 07/28/2019 were as follows: CBC showed white count of 4.9, H&H of 8.8/28.1 with an MCV of 87 and a platelet count of 386. CMP showed hypokalemia with a potassium of 2.9. Folate was normal. Reticulocyte count was low at 0.9% indicating hypoproliferative anemia. Haptoglobin and TSH was normal. Serum free light chain ratio and myeloma panel was unremarkable. And he was normal, serum copper, B6 and B12 levels were normal.  Bone marrow biopsy in February 2021 showed mildly hypocellular marrow with  trilineage hematopoiesis and maturation.  No significant dysplasia.  FISH panel for MDS was negative and cytogenetics were normal.  Storage iron present and ring sideroblasts absent  Interval history: Patient reports doing well and denies any complaints at this time   Review of Systems  Constitutional: Negative for chills, fever, malaise/fatigue and weight loss.  HENT: Negative for congestion, ear discharge and nosebleeds.   Eyes: Negative for blurred vision.  Respiratory: Negative for cough, hemoptysis, sputum production, shortness of breath and wheezing.   Cardiovascular: Negative for chest pain, palpitations, orthopnea and claudication.  Gastrointestinal: Negative for abdominal pain, blood in stool, constipation, diarrhea, heartburn, melena, nausea and vomiting.  Genitourinary: Negative for dysuria, flank pain, frequency, hematuria and urgency.  Musculoskeletal: Negative for back pain, joint pain and myalgias.  Skin: Negative for rash.  Neurological: Negative for dizziness, tingling, focal weakness, seizures, weakness and headaches.  Endo/Heme/Allergies: Does not bruise/bleed easily.  Psychiatric/Behavioral: Negative for depression and suicidal ideas. The patient does not have insomnia.     Allergies  Allergen Reactions  . Lisinopril Anaphylaxis and Swelling    Feet, hands, and throat became swollen  . Aspirin Nausea Only    Makes stomach burn  . Ibuprofen Nausea Only    Makes stomach burn  . Other Nausea Only and Other (See Comments)    No spicy foods = Indigestion, also    Past Medical History:  Diagnosis Date  . Anemia   . Bacteremia   . Blood dyscrasia    has to apply a lot of pressure to stop bleeding  . GERD (gastroesophageal reflux disease)   . Heart  murmur    with pregnancy  . History of hiatal hernia   . Hypertension    4-5 years ago, no meds  . Kidney infection    patient states I might have a kidney infection  . Migraine    migraines 1-2x/month  .  Pyelonephritis   . Seizure ([place]) 05/09/2017   due to low potassium  . Wears dentures    partial upper and lower    Past Surgical History:  Procedure Laterality Date  . ABDOMINAL HERNIA REPAIR    . CESAREAN SECTION     x 4  . CESAREAN SECTION     x 4  . COLONOSCOPY WITH PROPOFOL N/A 07/24/2019   Procedure: COLONOSCOPY WITH PROPOFOL;  Surgeon: Virgel Manifold, MD;  Location: [facility];  Service: Endoscopy;  Laterality: N/A;  . ENDOMETRIAL ABLATION  07/31/2016  . ESOPHAGOGASTRODUODENOSCOPY (EGD) WITH PROPOFOL N/A 07/24/2019   Procedure: ESOPHAGOGASTRODUODENOSCOPY (EGD) WITH PROPOFOL;  Surgeon: Virgel Manifold, MD;  Location: [facility];  Service: Endoscopy;  Laterality: N/A;  . HYSTEROSCOPY  07/31/2016   Procedure: HYSTEROSCOPY WITH HYDROTHERMAL ABLATION;  Surgeon: Aletha Halim, MD;  Location: [facility];  Service: Gynecology;;    Social History   Socioeconomic History  . Marital status: Married    Spouse name: Harrell Gave  . Number of children: 4  . Years of education: Not on file  . Highest education level: Not on file  Occupational History  . Not on file  Tobacco Use  . Smoking status: Current Every Day Smoker    Packs/day: 0.25    Types: Cigarettes  . Smokeless tobacco: Never Used  . Tobacco comment: 1 black and mild/day  Substance and Sexual Activity  . Alcohol use: Yes    Comment: occasionally  . Drug use: Yes    Types: Marijuana    Comment: 10/25/2019  . Sexual activity: Yes    Birth control/protection: None  Other Topics Concern  . Not on file  Social History Narrative  . Not on file   Social Determinants of Health   Financial Resource Strain:   . Difficulty of Paying Living Expenses: Not on file  Food Insecurity:   . Worried About Charity fundraiser in the Last Year: Not on file  . Ran Out of Food in the Last Year: Not on file  Transportation Needs:   . Lack of Transportation (Medical): Not on file  . Lack of  Transportation (Non-Medical): Not on file  Physical Activity:   . Days of Exercise per Week: Not on file  . Minutes of Exercise per Session: Not on file  Stress:   . Feeling of Stress : Not on file  Social Connections:   . Frequency of Communication with Friends and Family: Not on file  . Frequency of Social Gatherings with Friends and Family: Not on file  . Attends Religious Services: Not on file  . Active Member of Clubs or Organizations: Not on file  . Attends Archivist Meetings: Not on file  . Marital Status: Not on file  Intimate Partner Violence:   . Fear of Current or Ex-Partner: Not on file  . Emotionally Abused: Not on file  . Physically Abused: Not on file  . Sexually Abused: Not on file    Family History  Problem Relation Age of Onset  . Hypertension Mother   . Colon cancer Father 59  . Stroke Brother      Current Outpatient Medications:  .  acetaminophen (TYLENOL)  500 MG tablet, Take 1,000 mg by mouth every 6 (six) hours as needed for mild pain or headache., Disp: , Rfl:  .  albuterol (PROVENTIL HFA;VENTOLIN HFA) 108 (90 Base) MCG/ACT inhaler, Inhale 2 puffs into the lungs every 6 (six) hours as needed for wheezing or shortness of breath., Disp: , Rfl:  .  amLODipine (NORVASC) 2.5 MG tablet, Take 2.5 mg by mouth daily., Disp: , Rfl: 1 .  Ascorbic Acid (VITAMIN C PO), Take by mouth daily., Disp: , Rfl:  .  gabapentin (NEURONTIN) 100 MG capsule, Take 1 capsule by mouth 1 day or 1 dose., Disp: , Rfl:  .  magnesium oxide (MAG-OX) 400 MG tablet, Take 400 mg by mouth 2 (two) times daily., Disp: , Rfl:  .  Multiple Vitamins-Minerals (WOMENS DAILY FORMULA PO), Take 1 tablet by mouth daily., Disp: , Rfl:  .  nitrofurantoin, macrocrystal-monohydrate, (MACROBID) 100 MG capsule, Take 100 mg by mouth 2 (two) times daily., Disp: , Rfl:  .  omeprazole (PRILOSEC) 40 MG capsule, Take 1 capsule (40 mg total) by mouth 2 (two) times daily., Disp: 60 capsule, Rfl: 0 .   ondansetron (ZOFRAN-ODT) 4 MG disintegrating tablet, Take 4 mg by mouth every 8 (eight) hours as needed., Disp: , Rfl:  .  polyethylene glycol (MIRALAX / GLYCOLAX) packet, Take 17 g by mouth daily as needed for mild constipation. , Disp: , Rfl:  .  Potassium Chloride ER 20 MEQ TBCR, Take 1 tablet by mouth daily., Disp: , Rfl:   Current Facility-Administered Medications:  .  0.9 %  sodium chloride infusion, 500 mL, Intravenous, Once, Danis, Kirke Corin, MD  CT BONE MARROW BIOPSY & ASPIRATION  Result Date: 11/02/2019 CLINICAL DATA:  Anemia of unknown etiology EXAM: CT GUIDED DEEP ILIAC BONE ASPIRATION AND CORE BIOPSY TECHNIQUE: Patient was placed prone on the CT gantry and limited axial scans through the pelvis were obtained. Appropriate skin entry site was identified. Skin site was marked, prepped with chlorhexidine, draped in usual sterile fashion, and infiltrated locally with 1% lidocaine. Intravenous Fentanyl 113mg and Versed 284mwere administered as conscious sedation during continuous monitoring of the patient's level of consciousness and physiological / cardiorespiratory status by the radiology RN, with a total moderate sedation time of 10 minutes. Under CT fluoroscopic guidance an 11-gauge Cook trocar bone needle was advanced into the right iliac bone just lateral to the sacroiliac joint. Once needle tip position was confirmed, core and aspiration samples were obtained, submitted to pathology. Post procedure scans show no hematoma or fracture. Patient tolerated procedure well. COMPLICATIONS: COMPLICATIONS none IMPRESSION: 1. Technically successful CT guided right iliac bone core and aspiration biopsy. Electronically Signed   By: D Lucrezia Europe.D.   On: 11/02/2019 10:07    No images are attached to the encounter.   CMP Latest Ref Rng & Units 07/28/2019  Glucose 70 - 99 mg/dL 95  BUN 6 - 20 mg/dL 8  Creatinine 0.44 - 1.00 mg/dL 0.55  Sodium 135 - 145 mmol/L 135  Potassium 3.5 - 5.1 mmol/L  2.9(L)  Chloride 98 - 111 mmol/L 104  CO2 22 - 32 mmol/L 23  Calcium 8.9 - 10.3 mg/dL 9.1  Total Protein 6.5 - 8.1 g/dL 7.7  Total Bilirubin 0.3 - 1.2 mg/dL 0.4  Alkaline Phos 38 - 126 U/L 39  AST 15 - 41 U/L 17  ALT 0 - 44 U/L 13   CBC Latest Ref Rng & Units 11/02/2019  WBC 4.0 - 10.5 K/uL 4.6  Hemoglobin 12.0 - 15.0 g/dL 10.9(L)  Hematocrit 36.0 - 46.0 % 33.4(L)  Platelets 150 - 400 K/uL 305     Observation/objective: Appears in no acute distress over video visit today.  Breathing is nonlabored  Assessment and plan: Patient is a 44 year old female with normocytic anemia of unclear etiology  Patient has had extensive peripheral blood work-up for her normocytic anemia which is between 10-11 and work-up did not reveal any particular etiology.  She had some component of iron deficiency in the past but despite receiving IV iron her hemoglobin did not improve.  Bone marrow biopsy does not reveal any evidence of dysplasia or malignancy.  MDS panel was negative and cytogenetics were normal.  At this time I am inclined to monitor her anemia conservatively and if she were to develop worsening anemia I will consider referral to [facility] at that time versus trial of EPO if hemoglobin is less than 9.  Repeat CBC with differential in 3 months in 6 months and I will see her back in 6 months.  I will also check intelligent myeloid panel in 3 months  Follow-up instructions:  I discussed the assessment and treatment plan with the patient. The patient was provided an opportunity to ask questions and all were answered. The patient agreed with the plan and demonstrated an understanding of the instructions.   The patient was advised to call back or seek an in-person evaluation if the symptoms worsen or if the condition fails to improve as anticipated.   Visit Diagnosis: 1. Normocytic anemia     Dr. Randa Evens, MD, MPH [facility] Tel- [phone] 11/13/2019 1:54 PM

## 2019-12-01 DIAGNOSIS — G5601 Carpal tunnel syndrome, right upper limb: Secondary | ICD-10-CM | POA: Diagnosis not present

## 2019-12-12 ENCOUNTER — Emergency Department (HOSPITAL_COMMUNITY)
Admission: EM | Admit: 2019-12-12 | Discharge: 2019-12-12 | Disposition: A | Discharge status: Home / Self Care | Payer: BC Managed Care – PPO | Attending: Emergency Medicine | Admitting: Emergency Medicine

## 2019-12-12 ENCOUNTER — Encounter (HOSPITAL_COMMUNITY): Payer: Self-pay | Admitting: Emergency Medicine

## 2019-12-12 ENCOUNTER — Other Ambulatory Visit: Payer: Self-pay

## 2019-12-12 ENCOUNTER — Emergency Department (HOSPITAL_COMMUNITY): Payer: BC Managed Care – PPO

## 2019-12-12 DIAGNOSIS — F17210 Nicotine dependence, cigarettes, uncomplicated: Secondary | ICD-10-CM | POA: Insufficient documentation

## 2019-12-12 DIAGNOSIS — Z79899 Other long term (current) drug therapy: Secondary | ICD-10-CM | POA: Diagnosis not present

## 2019-12-12 DIAGNOSIS — F1290 Cannabis use, unspecified, uncomplicated: Secondary | ICD-10-CM | POA: Diagnosis not present

## 2019-12-12 DIAGNOSIS — R569 Unspecified convulsions: Secondary | ICD-10-CM | POA: Diagnosis not present

## 2019-12-12 DIAGNOSIS — I10 Essential (primary) hypertension: Secondary | ICD-10-CM | POA: Insufficient documentation

## 2019-12-12 DIAGNOSIS — R109 Unspecified abdominal pain: Secondary | ICD-10-CM | POA: Diagnosis not present

## 2019-12-12 DIAGNOSIS — R4182 Altered mental status, unspecified: Secondary | ICD-10-CM | POA: Diagnosis present

## 2019-12-12 DIAGNOSIS — R251 Tremor, unspecified: Secondary | ICD-10-CM | POA: Insufficient documentation

## 2019-12-12 DIAGNOSIS — R197 Diarrhea, unspecified: Secondary | ICD-10-CM | POA: Insufficient documentation

## 2019-12-12 DIAGNOSIS — R404 Transient alteration of awareness: Secondary | ICD-10-CM | POA: Diagnosis not present

## 2019-12-12 DIAGNOSIS — R112 Nausea with vomiting, unspecified: Secondary | ICD-10-CM | POA: Insufficient documentation

## 2019-12-12 DIAGNOSIS — R58 Hemorrhage, not elsewhere classified: Secondary | ICD-10-CM | POA: Diagnosis not present

## 2019-12-12 DIAGNOSIS — Z20822 Contact with and (suspected) exposure to covid-19: Secondary | ICD-10-CM | POA: Diagnosis not present

## 2019-12-12 LAB — MAGNESIUM: Magnesium: 1.7 mg/dL (ref 1.7–2.4)

## 2019-12-12 LAB — CBC WITH DIFFERENTIAL/PLATELET
Abs Immature Granulocytes: 0.01 10*3/uL (ref 0.00–0.07)
Basophils Absolute: 0 10*3/uL (ref 0.0–0.1)
Basophils Relative: 0 %
Eosinophils Absolute: 0.2 10*3/uL (ref 0.0–0.5)
Eosinophils Relative: 3 %
HCT: 31.6 % — ABNORMAL LOW (ref 36.0–46.0)
Hemoglobin: 10 g/dL — ABNORMAL LOW (ref 12.0–15.0)
Immature Granulocytes: 0 %
Lymphocytes Relative: 32 %
Lymphs Abs: 2.2 10*3/uL (ref 0.7–4.0)
MCH: 32.7 pg (ref 26.0–34.0)
MCHC: 31.6 g/dL (ref 30.0–36.0)
MCV: 103.3 fL — ABNORMAL HIGH (ref 80.0–100.0)
Monocytes Absolute: 0.3 10*3/uL (ref 0.1–1.0)
Monocytes Relative: 4 %
Neutro Abs: 4.1 10*3/uL (ref 1.7–7.7)
Neutrophils Relative %: 61 %
Platelets: 347 10*3/uL (ref 150–400)
RBC: 3.06 MIL/uL — ABNORMAL LOW (ref 3.87–5.11)
RDW: 13.4 % (ref 11.5–15.5)
WBC: 6.8 10*3/uL (ref 4.0–10.5)
nRBC: 0 % (ref 0.0–0.2)

## 2019-12-12 LAB — TYPE AND SCREEN
ABO/RH(D): O POS
Antibody Screen: NEGATIVE

## 2019-12-12 LAB — I-STAT BETA HCG BLOOD, ED (MC, WL, AP ONLY): I-stat hCG, quantitative: <5 m[IU]/mL (ref <5)

## 2019-12-12 LAB — URINALYSIS, ROUTINE W REFLEX MICROSCOPIC
Bacteria, UA: NONE SEEN
Bilirubin Urine: NEGATIVE
Glucose, UA: NEGATIVE mg/dL
Hgb urine dipstick: NEGATIVE
Ketones, ur: NEGATIVE mg/dL
Leukocytes,Ua: NEGATIVE
Nitrite: NEGATIVE
Protein, ur: 30 mg/dL — AB
Specific Gravity, Urine: 1.038 — ABNORMAL HIGH (ref 1.005–1.030)
pH: 5 (ref 5.0–8.0)

## 2019-12-12 LAB — LIPASE, BLOOD: Lipase: 21 U/L (ref 11–51)

## 2019-12-12 LAB — RAPID URINE DRUG SCREEN, HOSP PERFORMED
Amphetamines: NOT DETECTED
Barbiturates: POSITIVE — AB
Benzodiazepines: NOT DETECTED
Cocaine: NOT DETECTED
Opiates: POSITIVE — AB
Tetrahydrocannabinol: NOT DETECTED

## 2019-12-12 LAB — COMPREHENSIVE METABOLIC PANEL
ALT: 16 U/L (ref 0–44)
AST: 18 U/L (ref 15–41)
Albumin: 3.9 g/dL (ref 3.5–5.0)
Alkaline Phosphatase: 37 U/L — ABNORMAL LOW (ref 38–126)
Anion gap: 11 (ref 5–15)
BUN: 12 mg/dL (ref 6–20)
CO2: 18 mmol/L — ABNORMAL LOW (ref 22–32)
Calcium: 8.9 mg/dL (ref 8.9–10.3)
Chloride: 105 mmol/L (ref 98–111)
Creatinine, Ser: 0.73 mg/dL (ref 0.44–1.00)
GFR calc Af Amer: >60 mL/min (ref >60)
GFR calc non Af Amer: >60 mL/min (ref >60)
Glucose, Bld: 94 mg/dL (ref 70–99)
Potassium: 3.3 mmol/L — ABNORMAL LOW (ref 3.5–5.1)
Sodium: 134 mmol/L — ABNORMAL LOW (ref 135–145)
Total Bilirubin: 0.5 mg/dL (ref 0.3–1.2)
Total Protein: 6.4 g/dL — ABNORMAL LOW (ref 6.5–8.1)

## 2019-12-12 LAB — POC SARS CORONAVIRUS 2 AG -  ED: SARS Coronavirus 2 Ag: NEGATIVE

## 2019-12-12 LAB — IRON AND TIBC
Iron: 52 ug/dL (ref 28–170)
Saturation Ratios: 19 % (ref 10.4–31.8)
TIBC: 274 ug/dL (ref 250–450)
UIBC: 222 ug/dL

## 2019-12-12 LAB — RETICULOCYTES
Immature Retic Fract: 9.2 % (ref 2.3–15.9)
RBC.: 3.02 MIL/uL — ABNORMAL LOW (ref 3.87–5.11)
Retic Count, Absolute: 40.2 10*3/uL (ref 19.0–186.0)
Retic Ct Pct: 1.3 % (ref 0.4–3.1)

## 2019-12-12 LAB — PROTIME-INR
INR: 1 (ref 0.8–1.2)
Prothrombin Time: 13 seconds (ref 11.4–15.2)

## 2019-12-12 LAB — CBG MONITORING, ED: Glucose-Capillary: 90 mg/dL (ref 70–99)

## 2019-12-12 LAB — ABO/RH: ABO/RH(D): O POS

## 2019-12-12 LAB — FOLATE: Folate: 16.6 ng/mL (ref >5.9)

## 2019-12-12 LAB — ETHANOL: Alcohol, Ethyl (B): <10 mg/dL (ref <10)

## 2019-12-12 LAB — VITAMIN B12: Vitamin B-12: 3753 pg/mL — ABNORMAL HIGH (ref 180–914)

## 2019-12-12 LAB — FERRITIN: Ferritin: 22 ng/mL (ref 11–307)

## 2019-12-12 LAB — POC OCCULT BLOOD, ED: Fecal Occult Bld: POSITIVE — AB

## 2019-12-12 LAB — AMMONIA: Ammonia: 14 umol/L (ref 9–35)

## 2019-12-12 IMAGING — CT CT HEAD W/O CM
4 series · 14 of 47 positions shown, 16 images · non-contrast
Comparison: [DATE]

CLINICAL DATA: Recent seizure activity

EXAM:
CT HEAD WITHOUT CONTRAST
TECHNIQUE: Contiguous axial images were obtained from the base of the skull
through the vertex without intravenous contrast.

[Series 3: head without · axial · non-contrast · 0.46mm/px · z∈[-102,-2]mm · 7 of 28 slices shown, 9 images]
[im 4/28  brain]
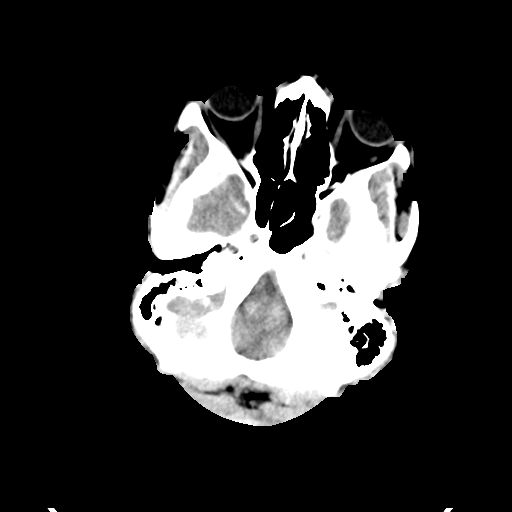
[im 4/28  bone]
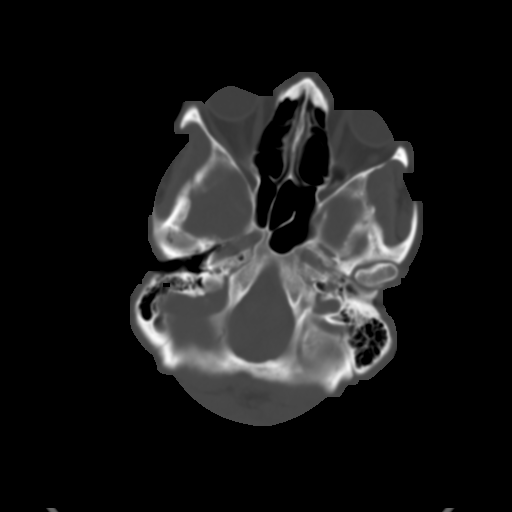
[im 7/28  brain]
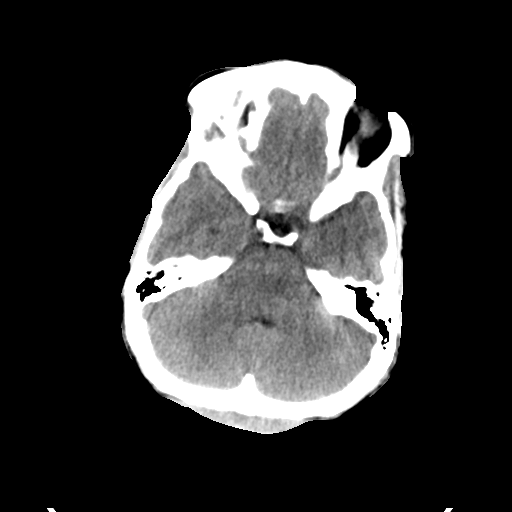
[im 11/28  brain]
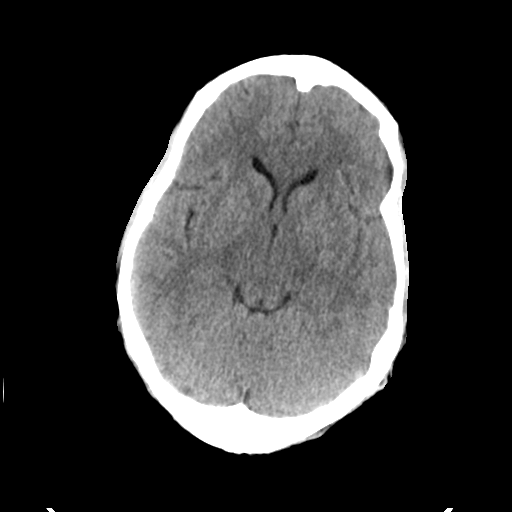
[im 14/28  brain]
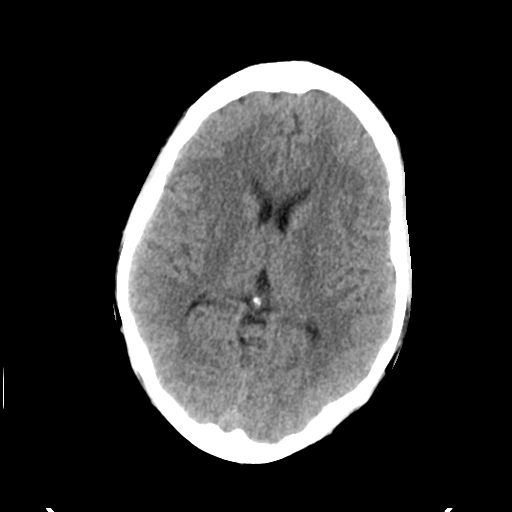
[im 17/28  brain]
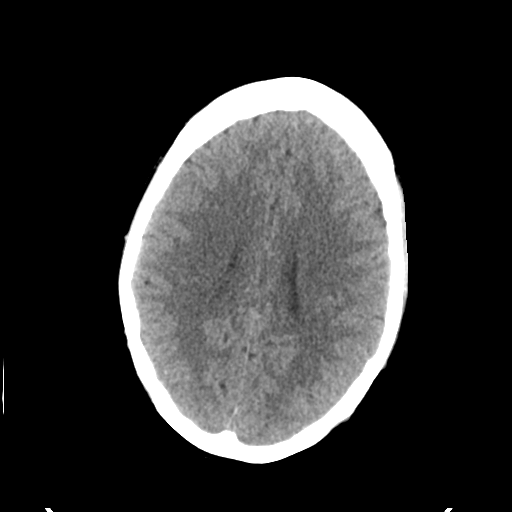
[im 17/28  bone]
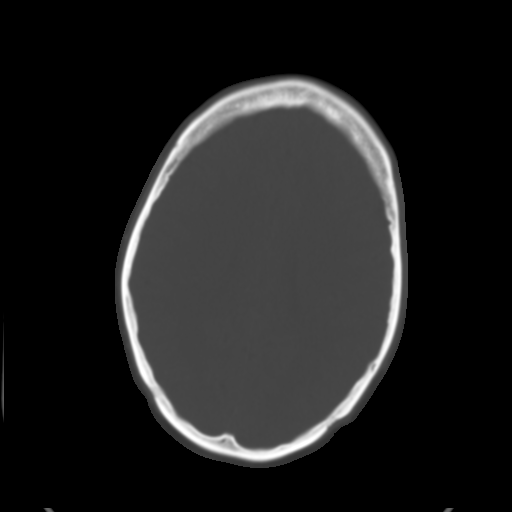
[im 21/28  brain]
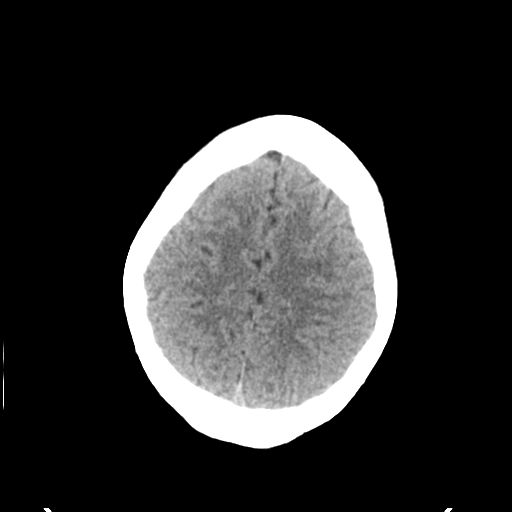
[im 24/28  brain]
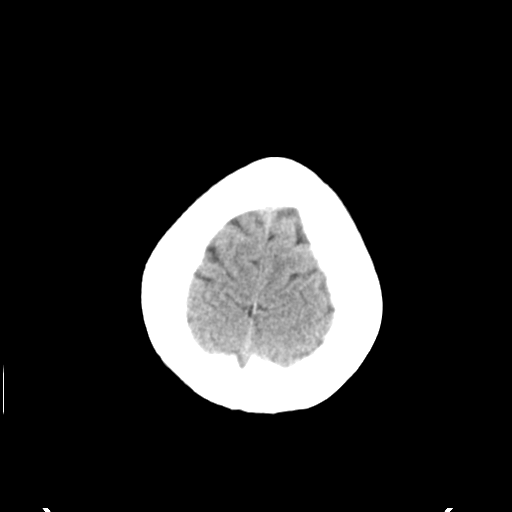

[Series 4: head bone · axial · 0.46mm/px · 1 of 70 slices shown]
[im 7/70  bone]
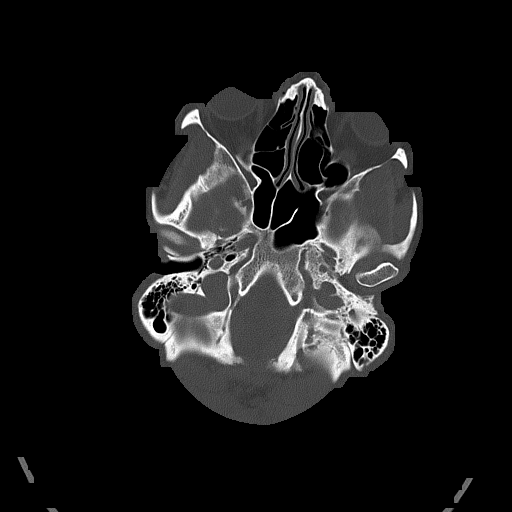

[Series 5: head without cor · coronal · non-contrast · 0.27mm/px · 3 of 67 slices shown]
[im 23/67  brain]
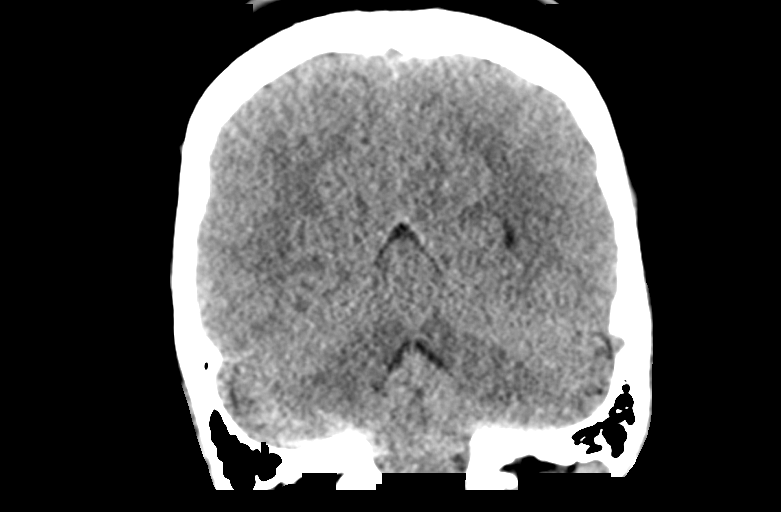
[im 30/67  brain]
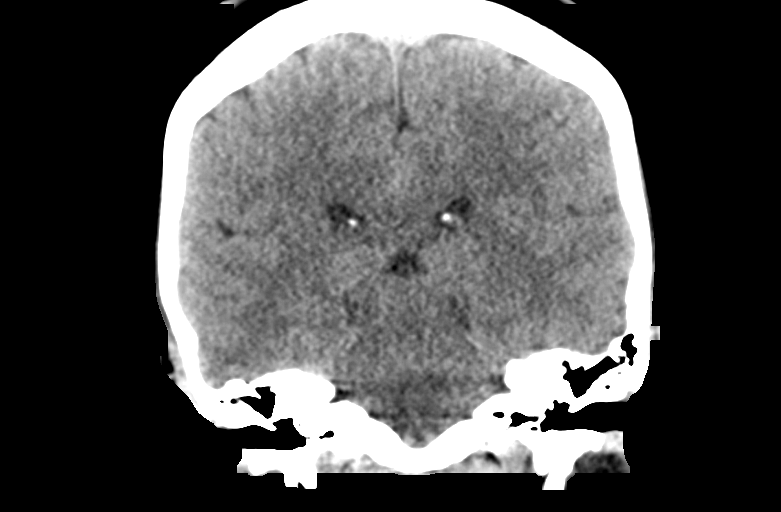
[im 37/67  brain]
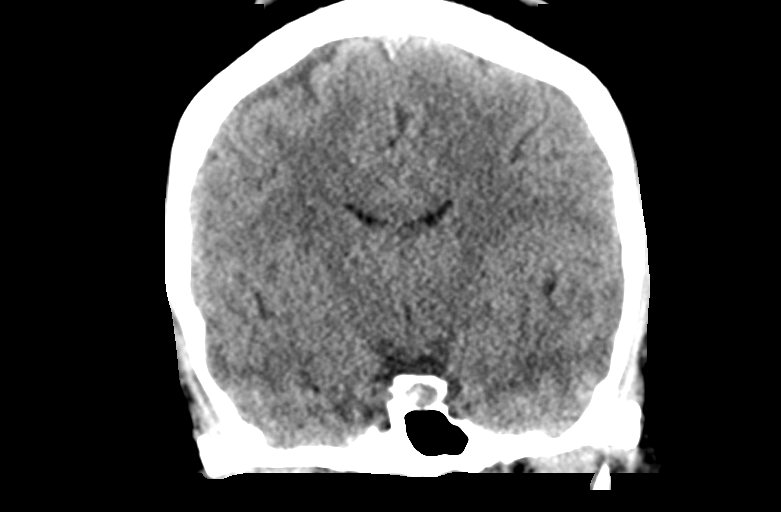

[Series 6: head without sag · sagittal · non-contrast · 0.27mm/px · 3 of 57 slices shown]
[im 19/57  brain]
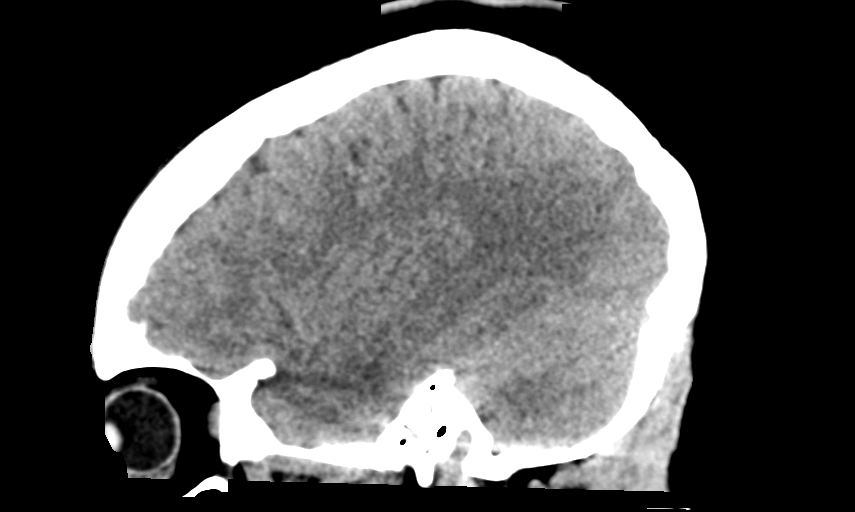
[im 29/57  brain]
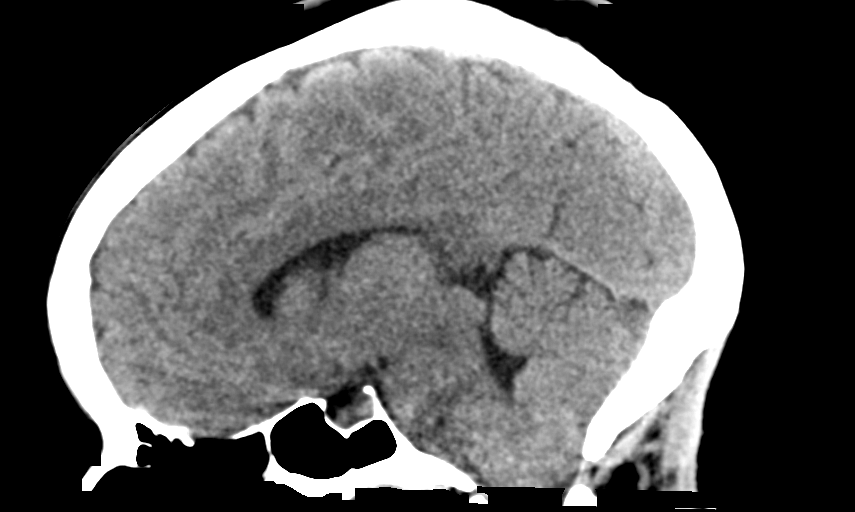
[im 38/57  brain]
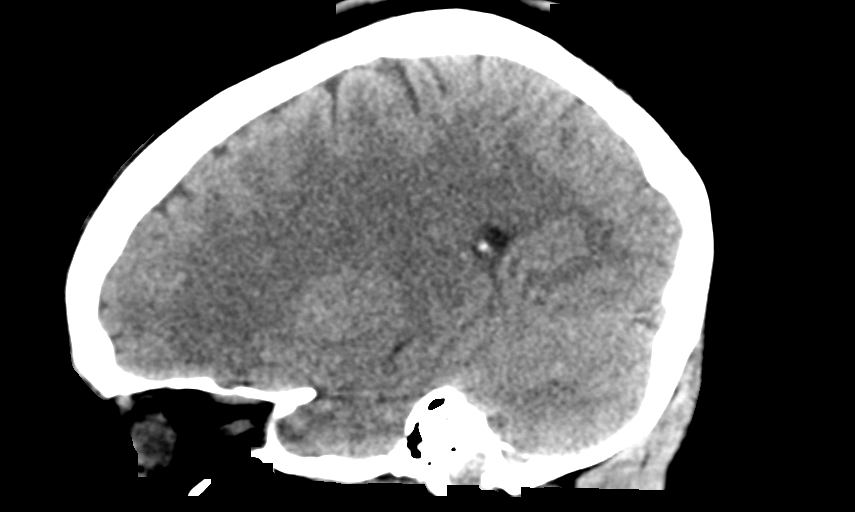

[14 of 47 positions shown; findings below may reference images not displayed]

FINDINGS: Brain: No evidence of acute infarction, hemorrhage, hydrocephalus,
extra-axial collection or mass lesion/mass effect.

Vascular: No hyperdense vessel or unexpected calcification.

Skull: Normal. Negative for fracture or focal lesion.

Sinuses/Orbits: No acute finding.

Other: None.
IMPRESSION: Normal head CT for age

## 2019-12-12 MED ORDER — NALOXONE HCL 0.4 MG/ML IJ SOLN
0.4000 mg | Freq: Once | INTRAMUSCULAR | Status: DC
  Filled 2019-12-12: qty 1

## 2019-12-12 MED ORDER — POTASSIUM CHLORIDE CRYS ER 20 MEQ PO TBCR
30.0000 meq | EXTENDED_RELEASE_TABLET | Freq: Once | ORAL | Status: AC
  Administered 2019-12-12: 30 meq via ORAL
  Filled 2019-12-12: qty 2

## 2019-12-12 MED ORDER — SODIUM CHLORIDE 0.9 % IV BOLUS
500.0000 mL | Freq: Once | INTRAVENOUS | Status: AC
  Administered 2019-12-12: 500 mL via INTRAVENOUS

## 2019-12-12 MED ORDER — ONDANSETRON HCL 4 MG/2ML IJ SOLN: 4.0000 mg | Freq: Once | INTRAMUSCULAR | Status: DC | PRN

## 2019-12-12 NOTE — Discharge Instructions (Addendum)
Today I suspect your symptoms were caused by vomiting.  Based on the history given it does not sound like you had a seizure today, however I would recommend not driving until you are cleared to do so by your primary care doctor.  Your B12 was very high.   Please follow up with your GI doctor, your hematologist, and your primary care doctor.

## 2019-12-12 NOTE — ED Notes (Signed)
Pt ambulated fine with no assistance. 

## 2019-12-12 NOTE — ED Triage Notes (Signed)
Pt here via EMS from home. Seziure like activity/ twitching. Pt alert to voice. EMS reports pt has been fatigued. She has recently had a bone marrow procedure per the family. Pt takes Mg at home and has a known bleeding ulcer. 132/90, 76hr, 16rr, 100% RA, 120 CBG.

## 2019-12-12 NOTE — ED Notes (Signed)
PA at bedside.

## 2019-12-12 NOTE — ED Notes (Signed)
[name], husband, [phone] would like an update when available

## 2019-12-12 NOTE — ED Provider Notes (Signed)
[facility] EMERGENCY DEPARTMENT Provider Note   CSN: [id number] Arrival date & time: 12/12/19  1846     History Chief Complaint  Patient presents with  . Seizures  . Altered Mental Status    Shirley [name] is a 44 y.o. female with a past medical history of hypertension, 1 seizure due to hypokalemia who presents today for evaluation of vomiting and shaking. History obtained from patient, chart review, and patient's daughter. Patient's daughter reports that patient ate a sub this afternoon and then started vomiting.  She had multiple episodes of vomiting and during this developed shivering.  She did not clearly have loss of consciousness.  No reported tonic-clonic like seizure activity.  Patient reports that she has a chronic bleeding ulcer that she is followed by GI for and managed by hematology for her anemia. She reports that she did go out and drink last night however was fine when she woke up this morning. She denies any stimulants.  No new medications.  She reports that she did take a leftover narcotic pain medicine today when she started vomiting due to the resulting abdominal pain, however denies frequent use.  She denies any barbiturate use.  Level 5 caveat patient fatigue.   HPI     Past Medical History:  Diagnosis Date  . Anemia   . Bacteremia   . Blood dyscrasia    has to apply a lot of pressure to stop bleeding  . GERD (gastroesophageal reflux disease)   . Heart murmur    with pregnancy  . History of hiatal hernia   . Hypertension    4-5 years ago, no meds  . Kidney infection    patient states I might have a kidney infection  . Migraine    migraines 1-2x/month  . Pyelonephritis   . Seizure (HCC) 05/09/2017   due to low potassium  . Wears dentures    partial upper and lower    Patient Active Problem List   Diagnosis Date Noted  . Iron deficiency anemia 08/10/2019  . Acute peptic ulcer of stomach   . Dysphagia, idiopathic   .  Nausea   . Abnormal CT scan, colon   . Pyelonephritis 08/03/2017  . Bacteremia 08/03/2017  . Hypokalemia 08/03/2017  . Tobacco use 08/03/2017  . Acute postoperative pain 07/31/2016  . Menometrorrhagia 04/09/2016  . Anemia 04/09/2016  . Abnormal uterine bleeding (AUB) 04/09/2016  . Essential hypertension 11/17/2009    Past Surgical History:  Procedure Laterality Date  . ABDOMINAL HERNIA REPAIR    . CESAREAN SECTION     x 4  . CESAREAN SECTION     x 4  . COLONOSCOPY WITH PROPOFOL N/A 07/24/2019   Procedure: COLONOSCOPY WITH PROPOFOL;  Surgeon: Pasty Spillers, MD;  Location: [facility];  Service: Endoscopy;  Laterality: N/A;  . ENDOMETRIAL ABLATION  07/31/2016  . ESOPHAGOGASTRODUODENOSCOPY (EGD) WITH PROPOFOL N/A 07/24/2019   Procedure: ESOPHAGOGASTRODUODENOSCOPY (EGD) WITH PROPOFOL;  Surgeon: Pasty Spillers, MD;  Location: [facility];  Service: Endoscopy;  Laterality: N/A;  . HYSTEROSCOPY  07/31/2016   Procedure: HYSTEROSCOPY WITH HYDROTHERMAL ABLATION;  Surgeon: [place] Bing, MD;  Location: [facility];  Service: Gynecology;;     OB History    Gravida  5   Para  4   Term  3   Preterm  1   AB  1   Living  4     SAB      TAB  Ectopic      Multiple      Live Births           Obstetric Comments  c--section x 4. 36wk c-section x 1        Family History  Problem Relation Age of Onset  . Hypertension Mother   . Colon cancer Father 37  . Stroke Brother     Social History   Tobacco Use  . Smoking status: Current Every Day Smoker    Packs/day: 0.25    Types: Cigarettes  . Smokeless tobacco: Never Used  . Tobacco comment: 1 black and mild/day  Substance Use Topics  . Alcohol use: Yes    Comment: occasionally  . Drug use: Yes    Types: Marijuana    Comment: 10/25/2019    Home Medications Prior to Admission medications   Medication Sig Start Date End Date Taking? Authorizing Provider  acetaminophen (TYLENOL) 500  MG tablet Take 1,000 mg by mouth every 6 (six) hours as needed for mild pain or headache.    [provider]  albuterol (PROVENTIL HFA;VENTOLIN HFA) 108 (90 Base) MCG/ACT inhaler Inhale 2 puffs into the lungs every 6 (six) hours as needed for wheezing or shortness of breath.    [provider]  amLODipine (NORVASC) 2.5 MG tablet Take 2.5 mg by mouth daily. 01/08/18   [provider]  Ascorbic Acid (VITAMIN C PO) Take by mouth daily.    [provider]  gabapentin (NEURONTIN) 100 MG capsule Take 1 capsule by mouth 1 day or 1 dose. 11/12/19   [provider]  magnesium oxide (MAG-OX) 400 MG tablet Take 400 mg by mouth 2 (two) times daily.    [provider]  Multiple Vitamins-Minerals (WOMENS DAILY FORMULA PO) Take 1 tablet by mouth daily.    [provider]  nitrofurantoin, macrocrystal-monohydrate, (MACROBID) 100 MG capsule Take 100 mg by mouth 2 (two) times daily. 11/12/19   [provider]  omeprazole (PRILOSEC) 40 MG capsule Take 1 capsule (40 mg total) by mouth 2 (two) times daily. 09/01/19 11/13/19  Virgel Manifold, MD  ondansetron (ZOFRAN-ODT) 4 MG disintegrating tablet Take 4 mg by mouth every 8 (eight) hours as needed. 08/13/19   [provider]  polyethylene glycol (MIRALAX / GLYCOLAX) packet Take 17 g by mouth daily as needed for mild constipation.     [provider]  Potassium Chloride ER 20 MEQ TBCR Take 1 tablet by mouth daily. 11/12/19   [provider]    Allergies    Lisinopril, Aspirin, Ibuprofen, and Other  Review of Systems   Review of Systems  Unable to perform ROS: Acuity of condition    Physical Exam Updated Vital Signs BP (!) 137/108 (BP Location: Left Arm)   Pulse 77   Temp 98.6 F (37 C) (Oral)   Resp 12   SpO2 100%   Physical Exam Vitals and nursing note reviewed. Exam conducted with a chaperone present.  Constitutional:      General: She is not in acute  distress.    Appearance: She is well-developed.     Comments: Initially patient is somnolent.  Her speech is very quiet and she will only answer with 1-2 words.  She appears diffusely tremulous, which is exacerbated with touch or movement.  HENT:     Head: Normocephalic and atraumatic.  Eyes:     Conjunctiva/sclera: Conjunctivae normal.  Cardiovascular:     Rate and Rhythm: Regular rhythm. Tachycardia present.  Heart sounds: No murmur.  Pulmonary:     Effort: Pulmonary effort is normal. No respiratory distress.     Breath sounds: Normal breath sounds.  Abdominal:     Palpations: Abdomen is soft.     Tenderness: There is no abdominal tenderness.  Genitourinary:    Rectum: Normal.  Musculoskeletal:        General: Normal range of motion.     Cervical back: Neck supple.     Right lower leg: No edema.     Left lower leg: No edema.  Skin:    General: Skin is warm and dry.  Neurological:     Motor: Tremor present.     Comments: Initial evaluation patient is somnolent, however easily awakens and is alert and oriented to person, place, and time.  Speech is not slurred.  5/5 strength bilateral upper and lower extremities.  She is diffusely tremulous without localized seizure-like activity.  Pupils are equal round reactive to light however they are bilaterally constricted.       ED Results / Procedures / Treatments   Labs (all labs ordered are listed, but only abnormal results are displayed) Labs Reviewed  COMPREHENSIVE METABOLIC PANEL - Abnormal; Notable for the following components:      Result Value   Sodium 134 (*)    Potassium 3.3 (*)    CO2 18 (*)    Total Protein 6.4 (*)    Alkaline Phosphatase 37 (*)    All other components within normal limits  CBC WITH DIFFERENTIAL/PLATELET - Abnormal; Notable for the following components:   RBC 3.06 (*)    Hemoglobin 10.0 (*)    HCT 31.6 (*)    MCV 103.3 (*)    All other components within normal limits  URINALYSIS, ROUTINE W  REFLEX MICROSCOPIC - Abnormal; Notable for the following components:   Specific Gravity, Urine 1.038 (*)    Protein, ur 30 (*)    All other components within normal limits  RAPID URINE DRUG SCREEN, HOSP PERFORMED - Abnormal; Notable for the following components:   Opiates POSITIVE (*)    Barbiturates POSITIVE (*)    All other components within normal limits  RETICULOCYTES - Abnormal; Notable for the following components:   RBC. 3.02 (*)    All other components within normal limits  VITAMIN B12 - Abnormal; Notable for the following components:   Vitamin B-12 3,753 (*)    All other components within normal limits  POC OCCULT BLOOD, ED - Abnormal; Notable for the following components:   Fecal Occult Bld POSITIVE (*)    All other components within normal limits  ETHANOL  FOLATE  FERRITIN  LIPASE, BLOOD  MAGNESIUM  PROTIME-INR  AMMONIA  IRON AND TIBC  CBG MONITORING, ED  I-STAT BETA HCG BLOOD, ED ([facility], [facility], [facility] ONLY)  POC SARS CORONAVIRUS 2 AG -  ED  TYPE AND SCREEN  ABO/RH    EKG EKG Interpretation  Date/Time:  Saturday December 12 2019 18:52:41 EDT Ventricular Rate:  76 PR Interval:    QRS Duration: 82 QT Interval:  399 QTC Calculation: 449 R Axis:   86 Text Interpretation: Sinus rhythm Anteroseptal infarct, old Confirmed by Raeford Razor [phone]) on 12/12/2019 7:35:37 PM   Radiology CT Head Wo Contrast  Result Date: 12/12/2019 CLINICAL DATA:  Recent seizure activity EXAM: CT HEAD WITHOUT CONTRAST TECHNIQUE: Contiguous axial images were obtained from the base of the skull through the vertex without intravenous contrast. COMPARISON:  08/02/2017 FINDINGS: Brain: No evidence of acute  infarction, hemorrhage, hydrocephalus, extra-axial collection or mass lesion/mass effect. Vascular: No hyperdense vessel or unexpected calcification. Skull: Normal. Negative for fracture or focal lesion. Sinuses/Orbits: No acute finding. Other: None. IMPRESSION: Normal head CT for age Electronically  Signed   By: Alcide Clever M.D.   On: 12/12/2019 19:54    Procedures Procedures (including critical care time)  Medications Ordered in ED Medications  sodium chloride 0.9 % bolus 500 mL (0 mLs Intravenous Stopped 12/12/19 2123)  potassium chloride SA (KLOR-CON) CR tablet 30 mEq (30 mEq Oral Given 12/12/19 2240)    ED Course  I have reviewed the triage vital signs and the nursing notes.  Pertinent labs & imaging results that were available during my care of the patient were reviewed by me and considered in my medical decision making (see chart for details).  Clinical Course as of Dec 13 1511  Sat Dec 12, 2019  2037 Patient is now more awake and alert so narcan held.  She has no idea why she would be positive for barbituates.    [EH]    Clinical Course User Index [EH] Norman Clay   MDM Rules/Calculators/A&P                     Patient is a 44 year old woman who presents today for evaluation of tremors and somnolence. Initially with limited history due to patient condition there is concern for anemia due to her reported bleeding ulcers. Occult blood was positive.  Patient chronically has bleeding ulcers and has followed by both GI and hematology for this.  In anticipation of this and anemia anemia panel was sent. Rectal temp was obtained and was not elevated.  She was not consistently tachycardic, hypoxic, or tachypneic.  CMP with mild hypokalemia at 3.3.  Suspect this is secondary to GI loss from vomiting, was orally repleted and patient was awake.  Ethanol is negative.  CT head was obtained without evidence of hemorrhage, or other cause for her symptoms. Patient was given IV fluids and allowed to rest, after which she returned to a normal mental status. UA is unremarkable.  Patient was able to p.o. challenge without difficulty. We discussed her UDS.  She states that she took a old pain pill and denies regular opioid use.  She is unsure why her urine tested positive for  barbiturates.  She was out drinking last night, and felt fine this morning after.  Return precautions were discussed with patient who states their understanding.  At the time of discharge patient denied any unaddressed complaints or concerns.  Patient is agreeable for discharge home.  Note: Portions of this report may have been transcribed using voice recognition software. Every effort was made to ensure accuracy; however, inadvertent computerized transcription errors may be present  Final Clinical Impression(s) / ED Diagnoses Final diagnoses:  Nausea vomiting and diarrhea    Rx / DC Orders ED Discharge Orders    None       Norman Clay 12/13/19 2241    Raeford Razor, MD 12/14/19 [phone]

## 2019-12-15 DIAGNOSIS — K5289 Other specified noninfective gastroenteritis and colitis: Secondary | ICD-10-CM | POA: Diagnosis not present

## 2019-12-15 DIAGNOSIS — I10 Essential (primary) hypertension: Secondary | ICD-10-CM | POA: Diagnosis not present

## 2019-12-15 DIAGNOSIS — K253 Acute gastric ulcer without hemorrhage or perforation: Secondary | ICD-10-CM | POA: Diagnosis not present

## 2019-12-29 DIAGNOSIS — G43019 Migraine without aura, intractable, without status migrainosus: Secondary | ICD-10-CM | POA: Diagnosis not present

## 2019-12-29 DIAGNOSIS — G5601 Carpal tunnel syndrome, right upper limb: Secondary | ICD-10-CM | POA: Diagnosis not present

## 2019-12-29 DIAGNOSIS — I10 Essential (primary) hypertension: Secondary | ICD-10-CM | POA: Diagnosis not present

## 2019-12-29 DIAGNOSIS — K439 Ventral hernia without obstruction or gangrene: Secondary | ICD-10-CM | POA: Diagnosis not present

## 2020-01-07 DIAGNOSIS — G5601 Carpal tunnel syndrome, right upper limb: Secondary | ICD-10-CM | POA: Diagnosis not present

## 2020-01-07 DIAGNOSIS — K253 Acute gastric ulcer without hemorrhage or perforation: Secondary | ICD-10-CM | POA: Diagnosis not present

## 2020-01-07 DIAGNOSIS — D509 Iron deficiency anemia, unspecified: Secondary | ICD-10-CM | POA: Diagnosis not present

## 2020-01-18 DIAGNOSIS — R202 Paresthesia of skin: Secondary | ICD-10-CM | POA: Diagnosis not present

## 2020-01-25 DIAGNOSIS — K432 Incisional hernia without obstruction or gangrene: Secondary | ICD-10-CM | POA: Diagnosis not present

## 2020-02-04 DIAGNOSIS — I10 Essential (primary) hypertension: Secondary | ICD-10-CM | POA: Diagnosis not present

## 2020-02-04 DIAGNOSIS — G5601 Carpal tunnel syndrome, right upper limb: Secondary | ICD-10-CM | POA: Diagnosis not present

## 2020-02-04 DIAGNOSIS — Z716 Tobacco abuse counseling: Secondary | ICD-10-CM | POA: Diagnosis not present

## 2020-02-04 DIAGNOSIS — K253 Acute gastric ulcer without hemorrhage or perforation: Secondary | ICD-10-CM | POA: Diagnosis not present

## 2020-02-04 DIAGNOSIS — F17210 Nicotine dependence, cigarettes, uncomplicated: Secondary | ICD-10-CM | POA: Diagnosis not present

## 2020-02-05 DIAGNOSIS — Z01818 Encounter for other preprocedural examination: Secondary | ICD-10-CM | POA: Diagnosis not present

## 2020-02-10 DIAGNOSIS — K432 Incisional hernia without obstruction or gangrene: Secondary | ICD-10-CM | POA: Diagnosis not present

## 2020-02-10 DIAGNOSIS — K439 Ventral hernia without obstruction or gangrene: Secondary | ICD-10-CM | POA: Diagnosis not present

## 2020-02-12 ENCOUNTER — Other Ambulatory Visit: Payer: Self-pay

## 2020-02-12 ENCOUNTER — Inpatient Hospital Stay: Payer: BC Managed Care – PPO | Attending: Oncology

## 2020-02-12 DIAGNOSIS — D649 Anemia, unspecified: Secondary | ICD-10-CM | POA: Diagnosis not present

## 2020-02-12 LAB — CBC WITH DIFFERENTIAL/PLATELET
Abs Immature Granulocytes: 0.01 10*3/uL (ref 0.00–0.07)
Basophils Absolute: 0 10*3/uL (ref 0.0–0.1)
Basophils Relative: 0 %
Eosinophils Absolute: 0.2 10*3/uL (ref 0.0–0.5)
Eosinophils Relative: 4 %
HCT: 34.5 % — ABNORMAL LOW (ref 36.0–46.0)
Hemoglobin: 11.5 g/dL — ABNORMAL LOW (ref 12.0–15.0)
Immature Granulocytes: 0 %
Lymphocytes Relative: 24 %
Lymphs Abs: 1.2 10*3/uL (ref 0.7–4.0)
MCH: 33.2 pg (ref 26.0–34.0)
MCHC: 33.3 g/dL (ref 30.0–36.0)
MCV: 99.7 fL (ref 80.0–100.0)
Monocytes Absolute: 0.2 10*3/uL (ref 0.1–1.0)
Monocytes Relative: 5 %
Neutro Abs: 3.2 10*3/uL (ref 1.7–7.7)
Neutrophils Relative %: 67 %
Platelets: 317 10*3/uL (ref 150–400)
RBC: 3.46 MIL/uL — ABNORMAL LOW (ref 3.87–5.11)
RDW: 13.7 % (ref 11.5–15.5)
WBC: 4.8 10*3/uL (ref 4.0–10.5)
nRBC: 0 % (ref 0.0–0.2)

## 2020-02-12 LAB — FERRITIN: Ferritin: 34 ng/mL (ref 11–307)

## 2020-02-12 LAB — IRON AND TIBC
Iron: 21 ug/dL — ABNORMAL LOW (ref 28–170)
Saturation Ratios: 8 % — ABNORMAL LOW (ref 10.4–31.8)
TIBC: 274 ug/dL (ref 250–450)
UIBC: 253 ug/dL

## 2020-02-20 LAB — INTELLIGEN MYELOID

## 2020-03-02 DIAGNOSIS — R209 Unspecified disturbances of skin sensation: Secondary | ICD-10-CM | POA: Diagnosis not present

## 2020-03-02 DIAGNOSIS — R208 Other disturbances of skin sensation: Secondary | ICD-10-CM | POA: Diagnosis not present

## 2020-03-03 DIAGNOSIS — I10 Essential (primary) hypertension: Secondary | ICD-10-CM | POA: Diagnosis not present

## 2020-03-03 DIAGNOSIS — K253 Acute gastric ulcer without hemorrhage or perforation: Secondary | ICD-10-CM | POA: Diagnosis not present

## 2020-03-03 DIAGNOSIS — G5601 Carpal tunnel syndrome, right upper limb: Secondary | ICD-10-CM | POA: Diagnosis not present

## 2020-03-24 DIAGNOSIS — G5601 Carpal tunnel syndrome, right upper limb: Secondary | ICD-10-CM | POA: Diagnosis not present

## 2020-03-30 ENCOUNTER — Telehealth: Payer: Self-pay | Admitting: Gastroenterology

## 2020-03-30 NOTE — Telephone Encounter (Signed)
Hey Dr Myrtie Neither, this pt is being referred to [place] by Dr Concepcion Elk. Pt last saw you on 05/2018. Pt was last seen at [facility] 09/01/2019, her records are in epic for you to review. Please advise on scheduling, thank you!

## 2020-03-31 NOTE — Telephone Encounter (Signed)
This patient transferred her care to [facility] (incidentally, her 3rd GI group in this area), and I recommend patient re-establish care with them for any ongoing issues.

## 2020-04-07 DIAGNOSIS — G5601 Carpal tunnel syndrome, right upper limb: Secondary | ICD-10-CM | POA: Diagnosis not present

## 2020-04-07 DIAGNOSIS — K253 Acute gastric ulcer without hemorrhage or perforation: Secondary | ICD-10-CM | POA: Diagnosis not present

## 2020-04-07 DIAGNOSIS — I10 Essential (primary) hypertension: Secondary | ICD-10-CM | POA: Diagnosis not present

## 2020-04-07 DIAGNOSIS — D509 Iron deficiency anemia, unspecified: Secondary | ICD-10-CM | POA: Diagnosis not present

## 2020-05-06 ENCOUNTER — Encounter: Payer: Self-pay | Admitting: Emergency Medicine

## 2020-05-06 ENCOUNTER — Other Ambulatory Visit: Payer: Self-pay

## 2020-05-06 DIAGNOSIS — I10 Essential (primary) hypertension: Secondary | ICD-10-CM | POA: Diagnosis not present

## 2020-05-06 DIAGNOSIS — Z79899 Other long term (current) drug therapy: Secondary | ICD-10-CM | POA: Insufficient documentation

## 2020-05-06 DIAGNOSIS — N83292 Other ovarian cyst, left side: Secondary | ICD-10-CM | POA: Diagnosis not present

## 2020-05-06 DIAGNOSIS — J45909 Unspecified asthma, uncomplicated: Secondary | ICD-10-CM | POA: Insufficient documentation

## 2020-05-06 DIAGNOSIS — I700 Atherosclerosis of aorta: Secondary | ICD-10-CM | POA: Diagnosis not present

## 2020-05-06 DIAGNOSIS — F17210 Nicotine dependence, cigarettes, uncomplicated: Secondary | ICD-10-CM | POA: Insufficient documentation

## 2020-05-06 DIAGNOSIS — R1110 Vomiting, unspecified: Secondary | ICD-10-CM | POA: Insufficient documentation

## 2020-05-06 DIAGNOSIS — K529 Noninfective gastroenteritis and colitis, unspecified: Secondary | ICD-10-CM | POA: Insufficient documentation

## 2020-05-06 DIAGNOSIS — K219 Gastro-esophageal reflux disease without esophagitis: Secondary | ICD-10-CM | POA: Insufficient documentation

## 2020-05-06 DIAGNOSIS — R188 Other ascites: Secondary | ICD-10-CM | POA: Diagnosis not present

## 2020-05-06 DIAGNOSIS — R1010 Upper abdominal pain, unspecified: Secondary | ICD-10-CM | POA: Diagnosis not present

## 2020-05-06 DIAGNOSIS — N83201 Unspecified ovarian cyst, right side: Secondary | ICD-10-CM | POA: Diagnosis not present

## 2020-05-06 LAB — CBC
HCT: 37.2 % (ref 36.0–46.0)
Hemoglobin: 12.7 g/dL (ref 12.0–15.0)
MCH: 34 pg (ref 26.0–34.0)
MCHC: 34.1 g/dL (ref 30.0–36.0)
MCV: 99.7 fL (ref 80.0–100.0)
Platelets: 378 10*3/uL (ref 150–400)
RBC: 3.73 MIL/uL — ABNORMAL LOW (ref 3.87–5.11)
RDW: 14.1 % (ref 11.5–15.5)
WBC: 5.5 10*3/uL (ref 4.0–10.5)
nRBC: 0 % (ref 0.0–0.2)

## 2020-05-06 LAB — URINALYSIS, COMPLETE (UACMP) WITH MICROSCOPIC
Bacteria, UA: NONE SEEN
Bilirubin Urine: NEGATIVE
Glucose, UA: NEGATIVE mg/dL
Ketones, ur: 5 mg/dL — AB
Leukocytes,Ua: NEGATIVE
Nitrite: NEGATIVE
Protein, ur: NEGATIVE mg/dL
Specific Gravity, Urine: 1.014 (ref 1.005–1.030)
pH: 7 (ref 5.0–8.0)

## 2020-05-06 LAB — COMPREHENSIVE METABOLIC PANEL
ALT: 13 U/L (ref 0–44)
AST: 17 U/L (ref 15–41)
Albumin: 4.5 g/dL (ref 3.5–5.0)
Alkaline Phosphatase: 40 U/L (ref 38–126)
Anion gap: 9 (ref 5–15)
BUN: 9 mg/dL (ref 6–20)
CO2: 28 mmol/L (ref 22–32)
Calcium: 9.5 mg/dL (ref 8.9–10.3)
Chloride: 103 mmol/L (ref 98–111)
Creatinine, Ser: 0.56 mg/dL (ref 0.44–1.00)
GFR calc Af Amer: >60 mL/min (ref >60)
GFR calc non Af Amer: >60 mL/min (ref >60)
Glucose, Bld: 102 mg/dL — ABNORMAL HIGH (ref 70–99)
Potassium: 3.6 mmol/L (ref 3.5–5.1)
Sodium: 140 mmol/L (ref 135–145)
Total Bilirubin: 0.6 mg/dL (ref 0.3–1.2)
Total Protein: 7.8 g/dL (ref 6.5–8.1)

## 2020-05-06 LAB — LIPASE, BLOOD: Lipase: 23 U/L (ref 11–51)

## 2020-05-06 MED ORDER — SODIUM CHLORIDE 0.9 % IV BOLUS
1000.0000 mL | Freq: Once | INTRAVENOUS | Status: AC
  Administered 2020-05-07: 1000 mL via INTRAVENOUS

## 2020-05-06 NOTE — ED Triage Notes (Signed)
Pt here with c/o upper abdominal pain that began about a week ago, vomiting began last night, states she had hernia repair in June and is feeling something "poking" her LUQ. Denies fever, alert and oriented. NAD.

## 2020-05-07 ENCOUNTER — Emergency Department: Payer: BC Managed Care – PPO

## 2020-05-07 ENCOUNTER — Emergency Department
Admission: EM | Admit: 2020-05-07 | Discharge: 2020-05-07 | Disposition: A | Discharge status: Home / Self Care | Payer: BC Managed Care – PPO | Attending: Emergency Medicine | Admitting: Emergency Medicine

## 2020-05-07 DIAGNOSIS — N83201 Unspecified ovarian cyst, right side: Secondary | ICD-10-CM | POA: Diagnosis not present

## 2020-05-07 DIAGNOSIS — R188 Other ascites: Secondary | ICD-10-CM | POA: Diagnosis not present

## 2020-05-07 DIAGNOSIS — N83292 Other ovarian cyst, left side: Secondary | ICD-10-CM | POA: Diagnosis not present

## 2020-05-07 DIAGNOSIS — I700 Atherosclerosis of aorta: Secondary | ICD-10-CM | POA: Diagnosis not present

## 2020-05-07 DIAGNOSIS — K529 Noninfective gastroenteritis and colitis, unspecified: Secondary | ICD-10-CM

## 2020-05-07 HISTORY — DX: Unspecified asthma, uncomplicated: J45.909

## 2020-05-07 LAB — PREGNANCY, URINE: Preg Test, Ur: NEGATIVE

## 2020-05-07 IMAGING — CT CT ABD-PELV W/ CM
2 of 5 series · 15 of 46 positions shown, 17 images · IV contrast (APPLIED)
Comparison: CT abdomen pelvis dated [DATE].

CLINICAL DATA: 44-year-old female with nausea vomiting.

EXAM:
CT ABDOMEN AND PELVIS WITH CONTRAST
TECHNIQUE: Multidetector CT imaging of the abdomen and pelvis was performed
using the standard protocol following bolus administration of
intravenous contrast.
CONTRAST:  75mL OMNIPAQUE IOHEXOL 300 MG/ML  SOLN

[Series 2: routine abd/pel with · axial · 0.63mm/px · z∈[-897,-487]mm · 12 of 92 slices shown, 14 images]
[im 5/92  soft-tissue]
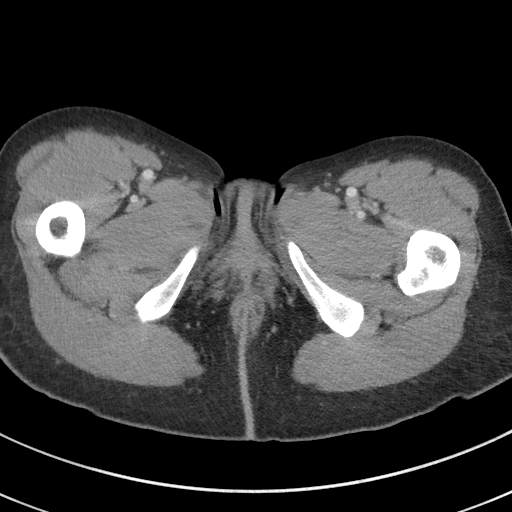
[im 5/92  bone]
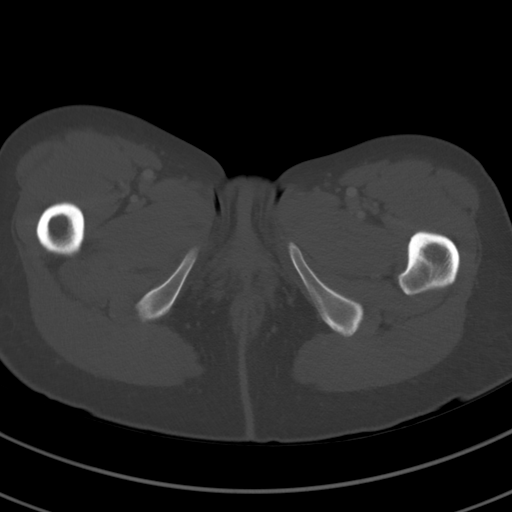
[im 15/92  soft-tissue]
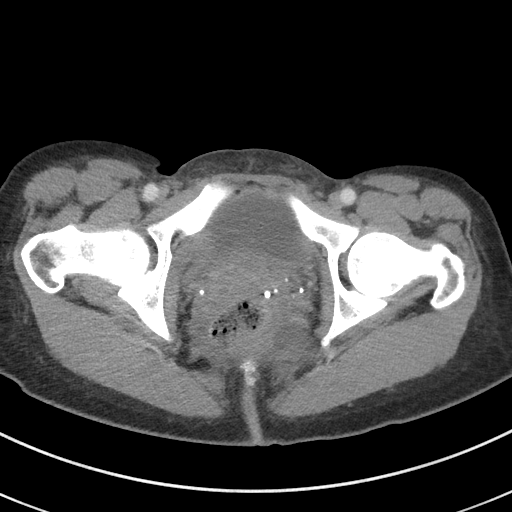
[im 20/92  soft-tissue]
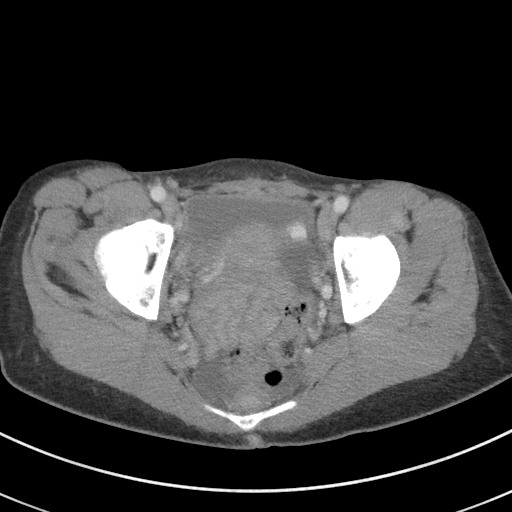
[im 29/92  soft-tissue]
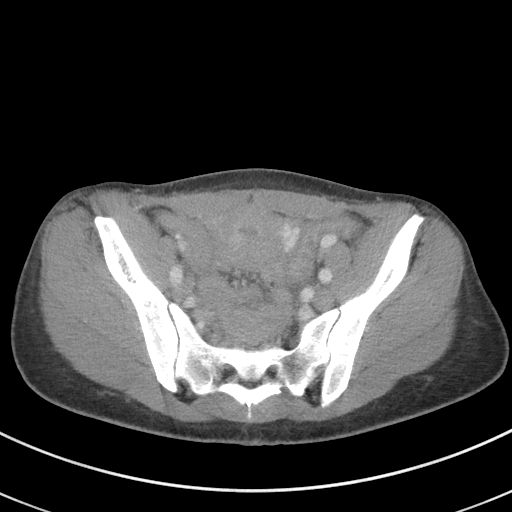
[im 34/92  soft-tissue]
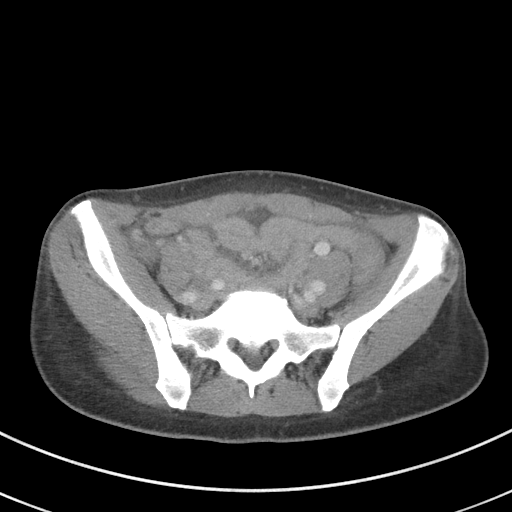
[im 44/92  soft-tissue]
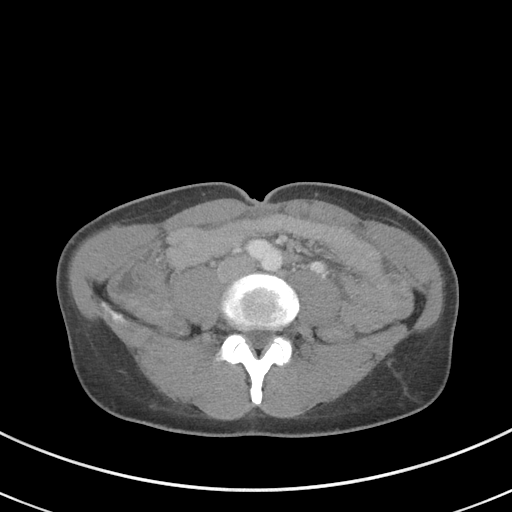
[im 48/92  soft-tissue]
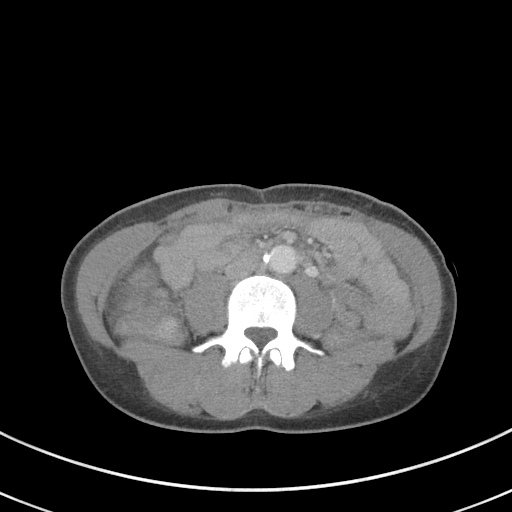
[im 58/92  soft-tissue]
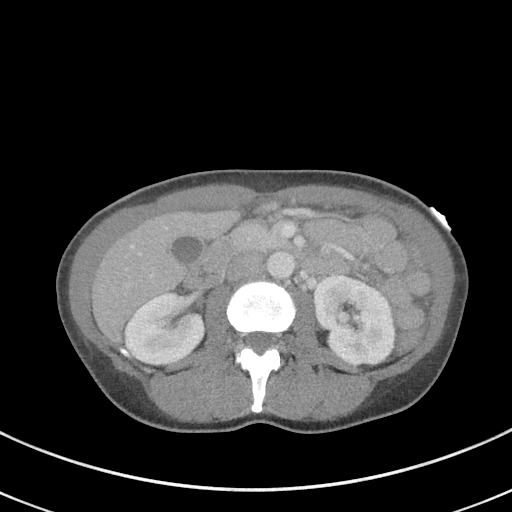
[im 63/92  soft-tissue]
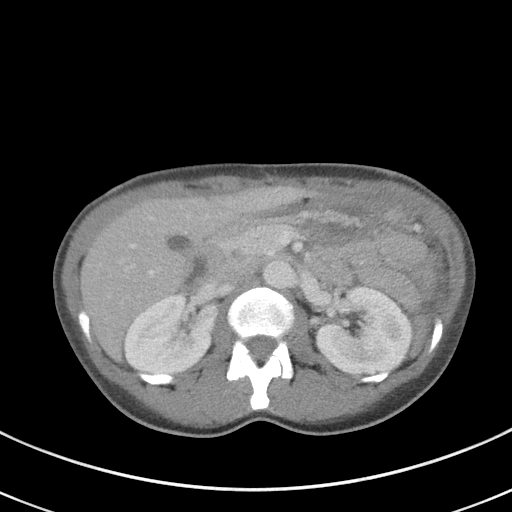
[im 63/92  bone]
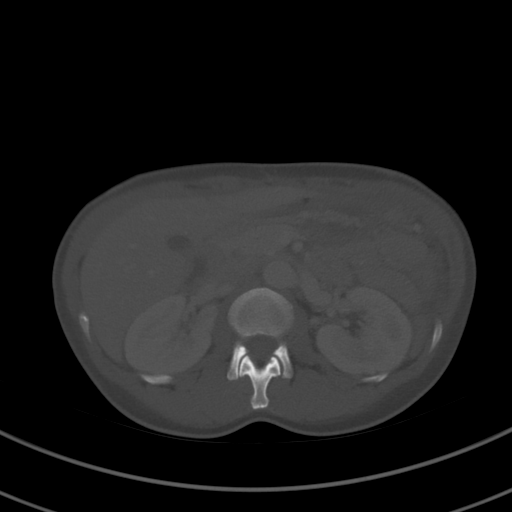
[im 72/92  soft-tissue]
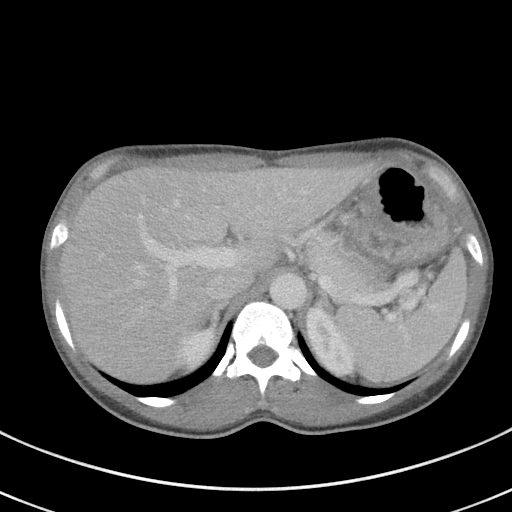
[im 77/92  soft-tissue]
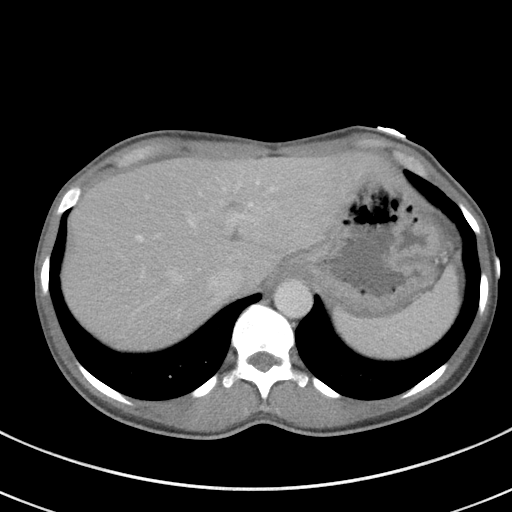
[im 87/92  soft-tissue]
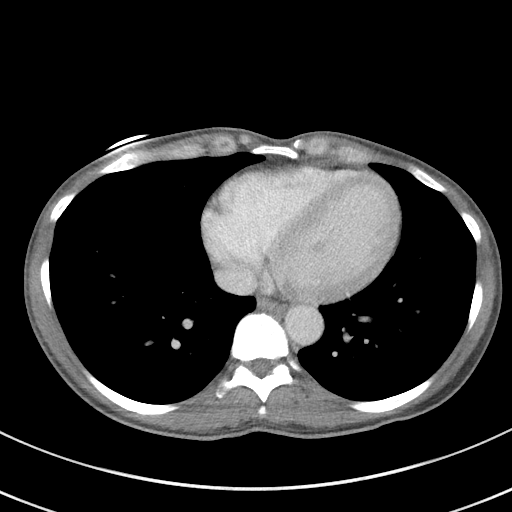

[Series 6: coronal st · coronal · 0.62mm/px · 3 of 65 slices shown]
[im 22/65  soft-tissue]
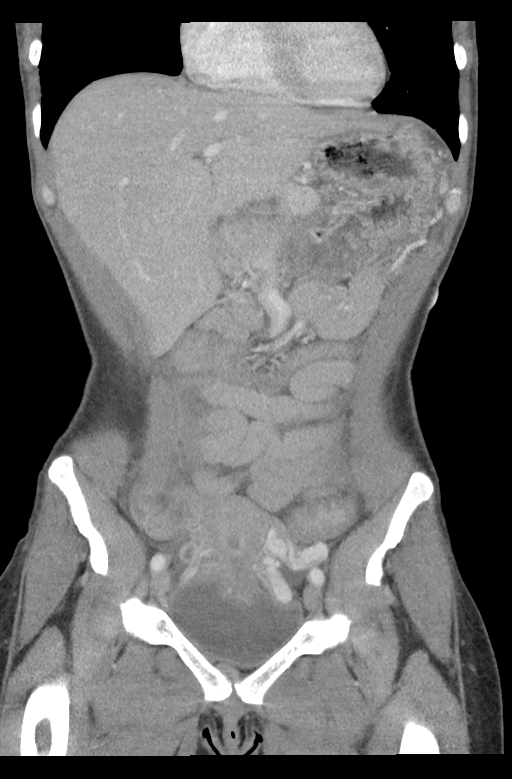
[im 29/65  soft-tissue]
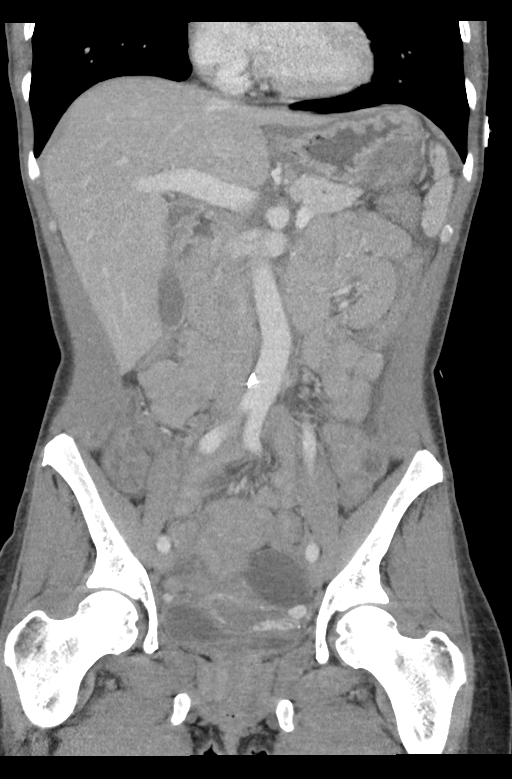
[im 36/65  soft-tissue]
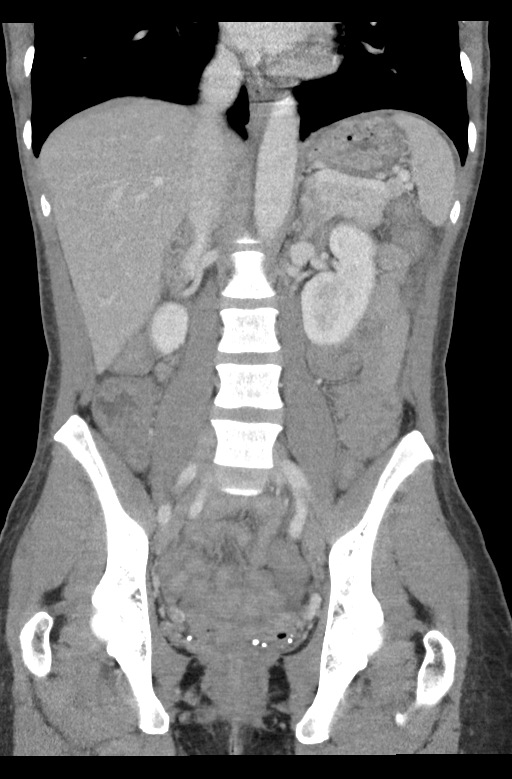

[15 of 46 positions shown; findings below may reference images not displayed]

FINDINGS: Lower chest: The visualized lung bases are clear.

No intra-abdominal free air.  Small free fluid in the pelvis.

Hepatobiliary: The liver is unremarkable. No intrahepatic biliary
ductal dilatation. The gallbladder is unremarkable.

Pancreas: Unremarkable. No pancreatic ductal dilatation or
surrounding inflammatory changes.

Spleen: Normal in size without focal abnormality.

Adrenals/Urinary Tract: The adrenal glands unremarkable. There is no
hydronephrosis on either side. There is symmetric enhancement and
excretion of contrast by both kidneys. The visualized ureters and
urinary bladder appear unremarkable

Stomach/Bowel: There is diffuse thickened appearance of the colon
most consistent with colitis. Correlation with clinical exam and
stool cultures recommended. Evaluation of the bowel is limited in
the absence of oral contrast and due to paucity of intra-abdominal
fat. There is no bowel obstruction. The appendix is unremarkable as
visualized.

Vascular/Lymphatic: Mild atherosclerotic calcification of the
abdominal aorta. The IVC is unremarkable. No portal venous gas. No
adenopathy.

Reproductive: The uterus is grossly unremarkable. There is a 15 mm
right ovarian corpus luteum. There is a 3.7 cm left ovarian cyst.
Ultrasound may provide better evaluation of the pelvic structures.

Other: Mild diffuse subcutaneous edema.

Musculoskeletal: No acute or significant osseous findings.
IMPRESSION: 1. Pancolitis. Correlation with clinical exam and stool cultures
recommended. No bowel obstruction. Normal appendix.
2. Bilateral ovarian cysts. Ultrasound may provide better evaluation
of the pelvic structures.
3. Small ascites.
4. Aortic Atherosclerosis ([AS]-[AS]).

## 2020-05-07 MED ORDER — PIPERACILLIN-TAZOBACTAM 3.375 G IVPB 30 MIN
3.3750 g | Freq: Once | INTRAVENOUS | Status: AC
  Administered 2020-05-07: 3.375 g via INTRAVENOUS
  Filled 2020-05-07: qty 50

## 2020-05-07 MED ORDER — ONDANSETRON HCL 4 MG/2ML IJ SOLN
4.0000 mg | Freq: Once | INTRAMUSCULAR | Status: AC
  Administered 2020-05-07: 4 mg via INTRAVENOUS
  Filled 2020-05-07: qty 2

## 2020-05-07 MED ORDER — MORPHINE SULFATE (PF) 4 MG/ML IV SOLN
4.0000 mg | Freq: Once | INTRAVENOUS | Status: AC
  Administered 2020-05-07: 4 mg via INTRAVENOUS
  Filled 2020-05-07: qty 1

## 2020-05-07 MED ORDER — ONDANSETRON 4 MG PO TBDP: 4.0000 mg | ORAL_TABLET | Freq: Three times a day (TID) | ORAL | 0 refills | Status: DC | PRN

## 2020-05-07 MED ORDER — IOHEXOL 300 MG/ML  SOLN
75.0000 mL | Freq: Once | INTRAMUSCULAR | Status: AC | PRN
  Administered 2020-05-07: 75 mL via INTRAVENOUS

## 2020-05-07 MED ORDER — ACETAMINOPHEN 500 MG PO TABS: 1000.0000 mg | ORAL_TABLET | Freq: Three times a day (TID) | ORAL | 0 refills | Status: DC | PRN

## 2020-05-07 MED ORDER — AMOXICILLIN-POT CLAVULANATE 875-125 MG PO TABS: 1.0000 | ORAL_TABLET | Freq: Two times a day (BID) | ORAL | 0 refills | Status: AC

## 2020-05-07 MED ORDER — METRONIDAZOLE 500 MG PO TABS: 500.0000 mg | ORAL_TABLET | Freq: Three times a day (TID) | ORAL | 0 refills | Status: AC

## 2020-05-07 MED ORDER — IOHEXOL 9 MG/ML PO SOLN: 500.0000 mL | ORAL | Status: AC

## 2020-05-07 MED ORDER — FAMOTIDINE IN NACL 20-0.9 MG/50ML-% IV SOLN
20.0000 mg | Freq: Once | INTRAVENOUS | Status: AC
  Administered 2020-05-07: 20 mg via INTRAVENOUS
  Filled 2020-05-07: qty 50

## 2020-05-07 NOTE — ED Notes (Signed)
Pt gave this RN verbal consent for DC. Sign pad unavailable.

## 2020-05-07 NOTE — ED Notes (Signed)
Patient transported to CT 

## 2020-05-07 NOTE — ED Provider Notes (Signed)
[facility] Emergency Department Provider Note  ____________________________________________  Time seen: Approximately 5:05 AM  I have reviewed the triage vital signs and the nursing notes.   HISTORY  Chief Complaint Abdominal Pain and Emesis   HPI Shirley AlleyLinda A Sumner is a 44 y.o. female with a history of peptic ulcer disease, anemia, pyelonephritis who presents for evaluation of abdominal pain.  Patient reports 1 week of  upper abdominal pain that she describes as sharp, constant, associated with nausea and vomiting.  She reports that her emesis is yellow.  No coffee-ground emesis.  She reports that her last 3 bowel movements were black.  She is not on blood thinners.  She denies diarrhea or constipation, she is passing flatus.  No chest pain or shortness of breath, no dysuria or hematuria.  Past Medical History:  Diagnosis Date  . Anemia   . Asthma   . Bacteremia   . Blood dyscrasia    has to apply a lot of pressure to stop bleeding  . GERD (gastroesophageal reflux disease)   . Heart murmur    with pregnancy  . History of hiatal hernia   . Hypertension    4-5 years ago, no meds  . Kidney infection    patient states I might have a kidney infection  . Migraine    migraines 1-2x/month  . Pyelonephritis   . Seizure (HCC) 05/09/2017   due to low potassium  . Wears dentures    partial upper and lower    Patient Active Problem List   Diagnosis Date Noted  . Iron deficiency anemia 08/10/2019  . Acute peptic ulcer of stomach   . Dysphagia, idiopathic   . Nausea   . Abnormal CT scan, colon   . Pyelonephritis 08/03/2017  . Bacteremia 08/03/2017  . Hypokalemia 08/03/2017  . Tobacco use 08/03/2017  . Acute postoperative pain 07/31/2016  . Menometrorrhagia 04/09/2016  . Anemia 04/09/2016  . Abnormal uterine bleeding (AUB) 04/09/2016  . Essential hypertension 11/17/2009    Past Surgical History:  Procedure Laterality Date  . ABDOMINAL HERNIA  REPAIR    . CESAREAN SECTION     x 4  . CESAREAN SECTION     x 4  . COLONOSCOPY WITH PROPOFOL N/A 07/24/2019   Procedure: COLONOSCOPY WITH PROPOFOL;  Surgeon: Pasty Spillersahiliani, Varnita B, MD;  [facility];  Service: Endoscopy;  Laterality: N/A;  . ENDOMETRIAL ABLATION  07/31/2016  . ESOPHAGOGASTRODUODENOSCOPY (EGD) WITH PROPOFOL N/A 07/24/2019   Procedure: ESOPHAGOGASTRODUODENOSCOPY (EGD) WITH PROPOFOL;  Surgeon: Pasty Spillersahiliani, Varnita B, MD;  [facility];  Service: Endoscopy;  Laterality: N/A;  . HYSTEROSCOPY  07/31/2016   Procedure: HYSTEROSCOPY WITH HYDROTHERMAL ABLATION;  Surgeon: Grindstone Bingharlie Pickens, MD;  Location: [facility];  Service: Gynecology;;    Prior to Admission medications   Medication Sig Start Date End Date Taking? Authorizing Provider  acetaminophen (TYLENOL) 500 MG tablet Take 2 tablets (1,000 mg total) by mouth every 8 (eight) hours as needed for mild pain, moderate pain, fever or headache. 05/07/20 05/07/21  Nita SickleVeronese, [place], MD  albuterol (PROVENTIL HFA;VENTOLIN HFA) 108 (90 Base) MCG/ACT inhaler Inhale 2 puffs into the lungs every 6 (six) hours as needed for wheezing or shortness of breath.    [provider]  amLODipine (NORVASC) 2.5 MG tablet Take 2.5 mg by mouth daily. 01/08/18   [provider]  amoxicillin-clavulanate (AUGMENTIN) 875-125 MG tablet Take 1 tablet by mouth 2 (two) times daily for 7 days. 05/07/20 05/14/20  Don PerkingVeronese, [place],  MD  Ascorbic Acid (VITAMIN C PO) Take by mouth daily.    [provider]  gabapentin (NEURONTIN) 100 MG capsule Take 1 capsule by mouth 1 day or 1 dose. 11/12/19   [provider]  magnesium oxide (MAG-OX) 400 MG tablet Take 400 mg by mouth 2 (two) times daily.    [provider]  metroNIDAZOLE (FLAGYL) 500 MG tablet Take 1 tablet (500 mg total) by mouth 3 (three) times daily for 7 days. 05/07/20 05/14/20  Nita Sickle, MD  Multiple Vitamins-Minerals (WOMENS DAILY FORMULA  PO) Take 1 tablet by mouth daily.    [provider]  nitrofurantoin, macrocrystal-monohydrate, (MACROBID) 100 MG capsule Take 100 mg by mouth 2 (two) times daily. 11/12/19   [provider]  omeprazole (PRILOSEC) 40 MG capsule Take 1 capsule (40 mg total) by mouth 2 (two) times daily. 09/01/19 11/13/19  Pasty Spillers, MD  ondansetron (ZOFRAN ODT) 4 MG disintegrating tablet Take 1 tablet (4 mg total) by mouth every 8 (eight) hours as needed. 05/07/20   Don Perking, [place], MD  polyethylene glycol [facility] / Ethelene Hal) packet Take 17 g by mouth daily as needed for mild constipation.     [provider]  Potassium Chloride ER 20 MEQ TBCR Take 1 tablet by mouth daily. 11/12/19   [provider]    Allergies Lisinopril, Aspirin, Ibuprofen, and Other  Family History  Problem Relation Age of Onset  . Hypertension Mother   . Colon cancer Father 25  . Stroke Brother     Social History Social History   Tobacco Use  . Smoking status: Current Every Day Smoker    Packs/day: 0.25    Types: Cigarettes  . Smokeless tobacco: Never Used  . Tobacco comment: 1 black and mild/day  Vaping Use  . Vaping Use: Never used  Substance Use Topics  . Alcohol use: Yes    Comment: occasionally  . Drug use: Yes    Types: Marijuana    Comment: 10/25/2019    Review of Systems  Constitutional: Negative for fever. Eyes: Negative for visual changes. ENT: Negative for sore throat. Neck: No neck pain  Cardiovascular: Negative for chest pain. Respiratory: Negative for shortness of breath. Gastrointestinal: + abdominal pain, nausea, and vomiting. No diarrhea. Genitourinary: Negative for dysuria. Musculoskeletal: Negative for back pain. Skin: Negative for rash. Neurological: Negative for headaches, weakness or numbness. Psych: No SI or HI  ____________________________________________   PHYSICAL EXAM:  VITAL SIGNS: ED Triage Vitals  Enc Vitals Group     BP 05/06/20  1827 (!) 157/104     Pulse Rate 05/06/20 1827 80     Resp 05/06/20 1827 18     Temp 05/06/20 1827 99.1 F (37.3 C)     Temp Source 05/06/20 1827 Oral     SpO2 05/06/20 1827 100 %     Weight 05/06/20 1827 119 lb (54 kg)     Height 05/06/20 1827  (1.575 m)     Head Circumference --      Peak Flow --      Pain Score 05/06/20 1842 9     Pain Loc --      Pain Edu? --      Excl. in GC? --     Constitutional: Alert and oriented. Well appearing and in no apparent distress. HEENT:      Head: Normocephalic and atraumatic.         Eyes: Conjunctivae are normal. Sclera is non-icteric.  Mouth/Throat: Mucous membranes are moist.       Neck: Supple with no signs of meningismus. Cardiovascular: Regular rate and rhythm. No murmurs, gallops, or rubs.  Respiratory: Normal respiratory effort. Lungs are clear to auscultation bilaterally. No wheezes, crackles, or rhonchi.  Gastrointestinal: Soft, tender to palpation diffusely worse on the upper quadrants, and non distended with positive bowel sounds. No rebound or guarding. Genitourinary: No CVA tenderness.  Rectal exam showing brown stool guaiac negative Musculoskeletal: Nontender with normal range of motion in all extremities. No edema, cyanosis, or erythema of extremities. Neurologic: Normal speech and language. Face is symmetric. Moving all extremities. No gross focal neurologic deficits are appreciated. Skin: Skin is warm, dry and intact. No rash noted. Psychiatric: Mood and affect are normal. Speech and behavior are normal.  ____________________________________________   LABS (all labs ordered are listed, but only abnormal results are displayed)  Labs Reviewed  COMPREHENSIVE METABOLIC PANEL - Abnormal; Notable for the following components:      Result Value   Glucose, Bld 102 (*)    All other components within normal limits  CBC - Abnormal; Notable for the following components:   RBC 3.73 (*)    All other components within  normal limits  URINALYSIS, COMPLETE (UACMP) WITH MICROSCOPIC - Abnormal; Notable for the following components:   Color, Urine YELLOW (*)    APPearance CLOUDY (*)    Hgb urine dipstick MODERATE (*)    Ketones, ur 5 (*)    All other components within normal limits  LIPASE, BLOOD  PREGNANCY, URINE  POC URINE PREG, ED   ____________________________________________  EKG  ED ECG REPORT I, Nita Sickle, the attending physician, personally viewed and interpreted this ECG.  Normal sinus rhythm, rate of 81, normal intervals, right axis deviation, anterior Q waves, no ST elevations or depressions. ____________________________________________  RADIOLOGY  I have personally reviewed the images performed during this visit and I agree with the Radiologist's read.   Interpretation by Radiologist:  CT Abdomen Pelvis W Contrast  Result Date: 05/07/2020 CLINICAL DATA:  44 year old female with nausea vomiting. EXAM: CT ABDOMEN AND PELVIS WITH CONTRAST TECHNIQUE: Multidetector CT imaging of the abdomen and pelvis was performed using the standard protocol following bolus administration of intravenous contrast. CONTRAST:  20mL OMNIPAQUE IOHEXOL 300 MG/ML  SOLN COMPARISON:  CT abdomen pelvis dated 07/08/2019. FINDINGS: Lower chest: The visualized lung bases are clear. No intra-abdominal free air.  Small free fluid in the pelvis. Hepatobiliary: The liver is unremarkable. No intrahepatic biliary ductal dilatation. The gallbladder is unremarkable. Pancreas: Unremarkable. No pancreatic ductal dilatation or surrounding inflammatory changes. Spleen: Normal in size without focal abnormality. Adrenals/Urinary Tract: The adrenal glands unremarkable. There is no hydronephrosis on either side. There is symmetric enhancement and excretion of contrast by both kidneys. The visualized ureters and urinary bladder appear unremarkable Stomach/Bowel: There is diffuse thickened appearance of the colon most consistent with  colitis. Correlation with clinical exam and stool cultures recommended. Evaluation of the bowel is limited in the absence of oral contrast and due to paucity of intra-abdominal fat. There is no bowel obstruction. The appendix is unremarkable as visualized. Vascular/Lymphatic: Mild atherosclerotic calcification of the abdominal aorta. The IVC is unremarkable. No portal venous gas. No adenopathy. Reproductive: The uterus is grossly unremarkable. There is a 15 mm right ovarian corpus luteum. There is a 3.7 cm left ovarian cyst. Ultrasound may provide better evaluation of the pelvic structures. Other: Mild diffuse subcutaneous edema. Musculoskeletal: No acute or significant osseous findings. IMPRESSION: 1. Pancolitis.  Correlation with clinical exam and stool cultures recommended. No bowel obstruction. Normal appendix. 2. Bilateral ovarian cysts. Ultrasound may provide better evaluation of the pelvic structures. 3. Small ascites. 4. Aortic Atherosclerosis (ICD10-I70.0). Electronically Signed   By: Elgie Collard M.D.   On: 05/07/2020 04:24     ____________________________________________   PROCEDURES  Procedure(s) performed:yes .1-3 Lead EKG Interpretation Performed by: Nita Sickle, MD Authorized by: Nita Sickle, MD     Interpretation: non-specific     ECG rate assessment: normal     Rhythm: sinus rhythm     Ectopy: none     Critical Care performed:  None ____________________________________________   INITIAL IMPRESSION / ASSESSMENT AND PLAN / ED COURSE  44 y.o. female with a history of peptic ulcer disease, anemia, pyelonephritis who presents for evaluation of abdominal pain, nausea, and vomiting x 1 week.  Patient is well-appearing in no distress with normal vital signs, does not look dehydrated.  Abdomen is soft and nondistended with diffuse tenderness throughout, no localized tenderness, rebound or guarding.  Differential diagnosis including peptic ulcer disease versus GERD  versus pancreatitis versus gallbladder pathology versus SBO versus UTI versus pyelonephritis versus enteritis versus gastritis versus colitis.  Labs show no electrolyte derangements, normal FTs and lipase, normal kidney function, no signs of significant dehydration, resolved anemia, no leukocytosis, UA negative for UTI, neg preg.  CT showing signs of diffuse colitis, confirmed by radiology.  Patient does not have diarrhea therefore I am unable to stand a stool for culture.  No signs of GI bleed.  Will treat with IV antibiotics, will give IV fluids, IV Zofran, and IV morphine.  Will reassess for discharge.  Old medical records reviewed.  _________________________ 6:12 AM on 05/07/2020 -----------------------------------------  Patient tolerated p.o. with no episodes of vomiting while in the room.  Will discharge home on Augmentin, Flagyl, Zofran, Tylenol, bland diet, increase oral hydration, and follow-up with PCP.  Discussed my standard return precautions     _____________________________________________ Please note:  Patient was evaluated in Emergency Department today for the symptoms described in the history of present illness. Patient was evaluated in the context of the global COVID-19 pandemic, which necessitated consideration that the patient might be at risk for infection with the SARS-CoV-2 virus that causes COVID-19. Institutional protocols and algorithms that pertain to the evaluation of patients at risk for COVID-19 are in a state of rapid change based on information released by regulatory bodies including the CDC and federal and state organizations. These policies and algorithms were followed during the patient's care in the ED.  Some ED evaluations and interventions may be delayed as a result of limited staffing during the pandemic.   [place] Controlled Substance Database was reviewed by me. ____________________________________________   FINAL CLINICAL IMPRESSION(S) / ED  DIAGNOSES   Final diagnoses:  Colitis      NEW MEDICATIONS STARTED DURING THIS VISIT:  ED Discharge Orders         Ordered    amoxicillin-clavulanate (AUGMENTIN) 875-125 MG tablet  2 times daily        05/07/20 0611    metroNIDAZOLE (FLAGYL) 500 MG tablet  3 times daily        05/07/20 0611    ondansetron (ZOFRAN ODT) 4 MG disintegrating tablet  Every 8 hours PRN        05/07/20 0611    acetaminophen (TYLENOL) 500 MG tablet  Every 8 hours PRN        05/07/20 3903  Note:  This document was prepared using Dragon voice recognition software and may include unintentional dictation errors.    Don Perking, [place], MD 05/07/20 [phone]

## 2020-05-17 ENCOUNTER — Inpatient Hospital Stay: Payer: BC Managed Care – PPO | Attending: Oncology

## 2020-05-19 ENCOUNTER — Telehealth: Payer: Self-pay | Admitting: Oncology

## 2020-05-19 ENCOUNTER — Other Ambulatory Visit: Payer: Self-pay

## 2020-05-19 ENCOUNTER — Inpatient Hospital Stay: Payer: BC Managed Care – PPO | Admitting: Oncology

## 2020-05-19 ENCOUNTER — Encounter: Payer: Self-pay | Admitting: Oncology

## 2020-05-19 NOTE — Telephone Encounter (Signed)
Writer was informed that patient did not complete labs and would need to have labs and video visit rescheduled. Writer attempted to reach patient to reschedule but could not reach patient. Voicemail left [facility] to [facility]. Letter also mailed to patient requesting return call to reschedule.

## 2020-06-07 DIAGNOSIS — K253 Acute gastric ulcer without hemorrhage or perforation: Secondary | ICD-10-CM | POA: Diagnosis not present

## 2020-06-07 DIAGNOSIS — R569 Unspecified convulsions: Secondary | ICD-10-CM | POA: Diagnosis not present

## 2020-06-07 DIAGNOSIS — I10 Essential (primary) hypertension: Secondary | ICD-10-CM | POA: Diagnosis not present

## 2020-06-07 DIAGNOSIS — D509 Iron deficiency anemia, unspecified: Secondary | ICD-10-CM | POA: Diagnosis not present

## 2020-06-23 DIAGNOSIS — Z13220 Encounter for screening for lipoid disorders: Secondary | ICD-10-CM | POA: Diagnosis not present

## 2020-06-23 DIAGNOSIS — K253 Acute gastric ulcer without hemorrhage or perforation: Secondary | ICD-10-CM | POA: Diagnosis not present

## 2020-06-23 DIAGNOSIS — E559 Vitamin D deficiency, unspecified: Secondary | ICD-10-CM | POA: Diagnosis not present

## 2020-06-23 DIAGNOSIS — Z0000 Encounter for general adult medical examination without abnormal findings: Secondary | ICD-10-CM | POA: Diagnosis not present

## 2020-06-23 DIAGNOSIS — Z7189 Other specified counseling: Secondary | ICD-10-CM | POA: Diagnosis not present

## 2020-06-23 DIAGNOSIS — Z131 Encounter for screening for diabetes mellitus: Secondary | ICD-10-CM | POA: Diagnosis not present

## 2020-06-23 DIAGNOSIS — I10 Essential (primary) hypertension: Secondary | ICD-10-CM | POA: Diagnosis not present

## 2020-07-17 ENCOUNTER — Emergency Department: Payer: BC Managed Care – PPO

## 2020-07-17 ENCOUNTER — Emergency Department
Admission: EM | Admit: 2020-07-17 | Discharge: 2020-07-17 | Disposition: A | Discharge status: Home / Self Care | Payer: BC Managed Care – PPO | Attending: Emergency Medicine | Admitting: Emergency Medicine

## 2020-07-17 ENCOUNTER — Encounter: Payer: Self-pay | Admitting: Radiology

## 2020-07-17 ENCOUNTER — Other Ambulatory Visit: Payer: Self-pay

## 2020-07-17 DIAGNOSIS — J45909 Unspecified asthma, uncomplicated: Secondary | ICD-10-CM | POA: Diagnosis not present

## 2020-07-17 DIAGNOSIS — R109 Unspecified abdominal pain: Secondary | ICD-10-CM | POA: Diagnosis not present

## 2020-07-17 DIAGNOSIS — I10 Essential (primary) hypertension: Secondary | ICD-10-CM | POA: Diagnosis not present

## 2020-07-17 DIAGNOSIS — K529 Noninfective gastroenteritis and colitis, unspecified: Secondary | ICD-10-CM | POA: Insufficient documentation

## 2020-07-17 DIAGNOSIS — Z8719 Personal history of other diseases of the digestive system: Secondary | ICD-10-CM | POA: Diagnosis not present

## 2020-07-17 DIAGNOSIS — F17210 Nicotine dependence, cigarettes, uncomplicated: Secondary | ICD-10-CM | POA: Insufficient documentation

## 2020-07-17 DIAGNOSIS — Z79899 Other long term (current) drug therapy: Secondary | ICD-10-CM | POA: Diagnosis not present

## 2020-07-17 DIAGNOSIS — R079 Chest pain, unspecified: Secondary | ICD-10-CM | POA: Insufficient documentation

## 2020-07-17 DIAGNOSIS — R569 Unspecified convulsions: Secondary | ICD-10-CM | POA: Diagnosis not present

## 2020-07-17 DIAGNOSIS — R200 Anesthesia of skin: Secondary | ICD-10-CM | POA: Diagnosis not present

## 2020-07-17 DIAGNOSIS — R1013 Epigastric pain: Secondary | ICD-10-CM | POA: Diagnosis not present

## 2020-07-17 LAB — CBC
HCT: 37.3 % (ref 36.0–46.0)
Hemoglobin: 12.6 g/dL (ref 12.0–15.0)
MCH: 33.2 pg (ref 26.0–34.0)
MCHC: 33.8 g/dL (ref 30.0–36.0)
MCV: 98.2 fL (ref 80.0–100.0)
Platelets: 385 10*3/uL (ref 150–400)
RBC: 3.8 MIL/uL — ABNORMAL LOW (ref 3.87–5.11)
RDW: 13.1 % (ref 11.5–15.5)
WBC: 5 10*3/uL (ref 4.0–10.5)
nRBC: 0 % (ref 0.0–0.2)

## 2020-07-17 LAB — POC URINE PREG, ED: Preg Test, Ur: NEGATIVE

## 2020-07-17 LAB — BASIC METABOLIC PANEL
Anion gap: 13 (ref 5–15)
BUN: 12 mg/dL (ref 6–20)
CO2: 24 mmol/L (ref 22–32)
Calcium: 9.2 mg/dL (ref 8.9–10.3)
Chloride: 100 mmol/L (ref 98–111)
Creatinine, Ser: 0.62 mg/dL (ref 0.44–1.00)
GFR, Estimated: >60 mL/min (ref >60)
Glucose, Bld: 83 mg/dL (ref 70–99)
Potassium: 3.2 mmol/L — ABNORMAL LOW (ref 3.5–5.1)
Sodium: 137 mmol/L (ref 135–145)

## 2020-07-17 LAB — TROPONIN I (HIGH SENSITIVITY)
Troponin I (High Sensitivity): 4 ng/L (ref <18)
Troponin I (High Sensitivity): 4 ng/L (ref <18)

## 2020-07-17 IMAGING — CT CT ABD-PELV W/ CM
2 of 5 series · 16 of 46 positions shown, 18 images · IV contrast (APPLIED)
Comparison: [DATE]

CLINICAL DATA: Abdominal pain

EXAM:
CT ABDOMEN AND PELVIS WITH CONTRAST
TECHNIQUE: Multidetector CT imaging of the abdomen and pelvis was performed
using the standard protocol following bolus administration of
intravenous contrast.
CONTRAST:  100mL OMNIPAQUE IOHEXOL 300 MG/ML  SOLN

[Series 2: routine abd/pel with · axial · 0.64mm/px · z∈[-869,-469]mm · 13 of 90 slices shown, 15 images]
[im 5/90  soft-tissue]
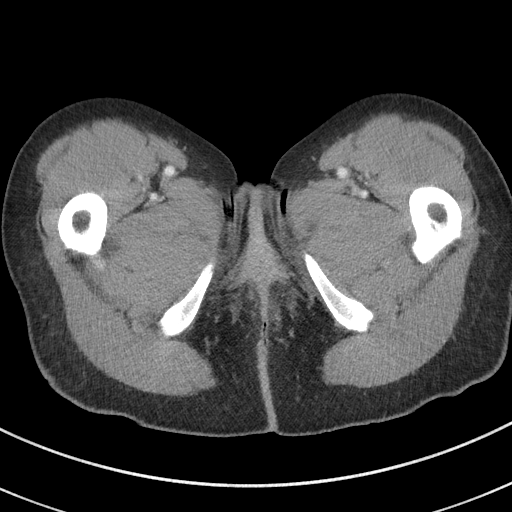
[im 5/90  bone]
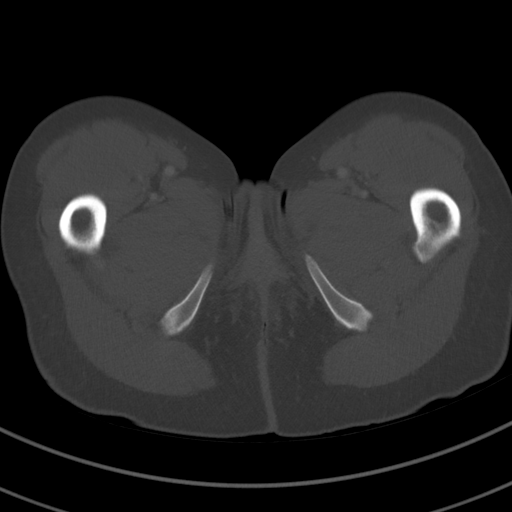
[im 14/90  soft-tissue]
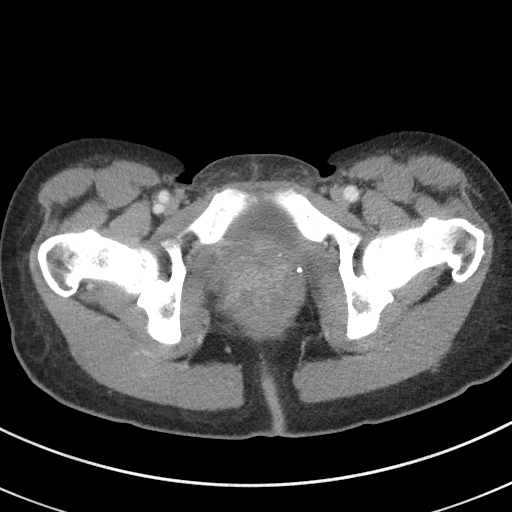
[im 18/90  soft-tissue]
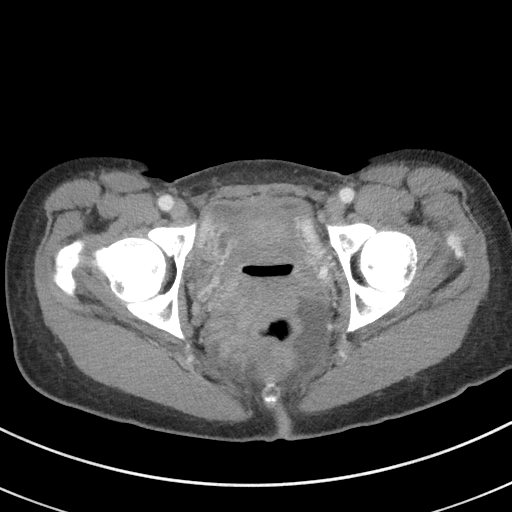
[im 27/90  soft-tissue]
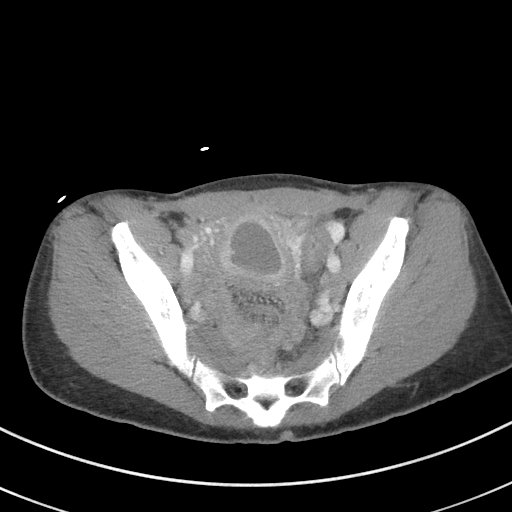
[im 32/90  soft-tissue]
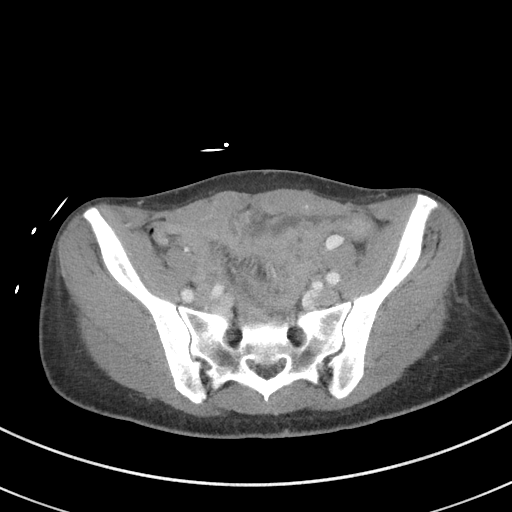
[im 41/90  soft-tissue]
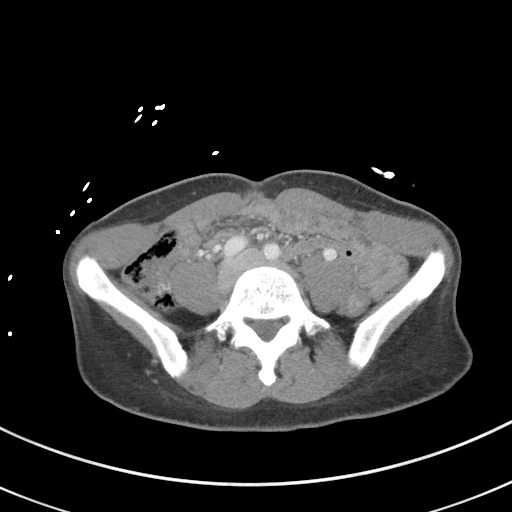
[im 45/90  soft-tissue]
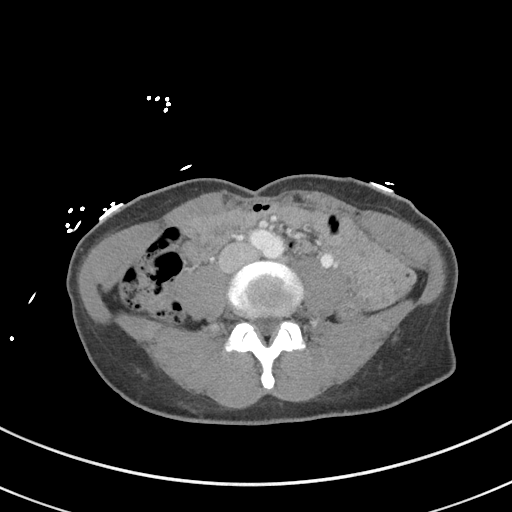
[im 49/90  soft-tissue]
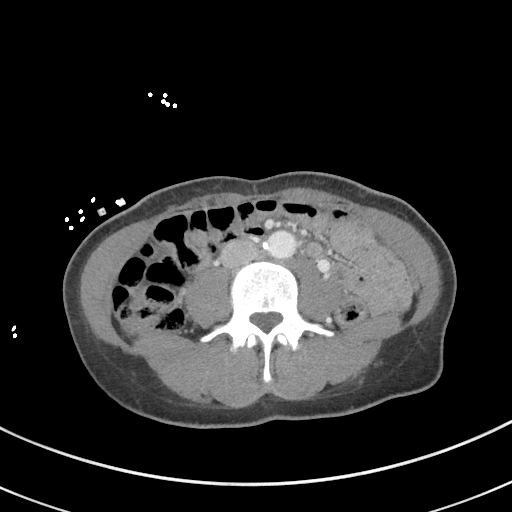
[im 58/90  soft-tissue]
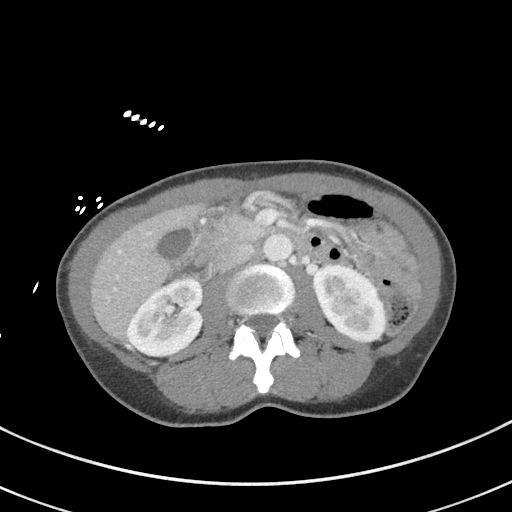
[im 58/90  bone]
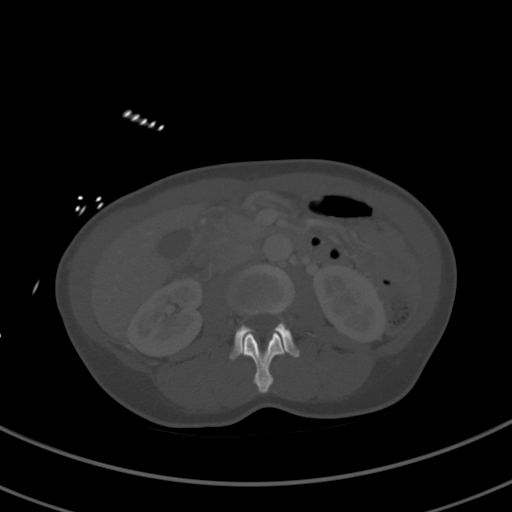
[im 63/90  soft-tissue]
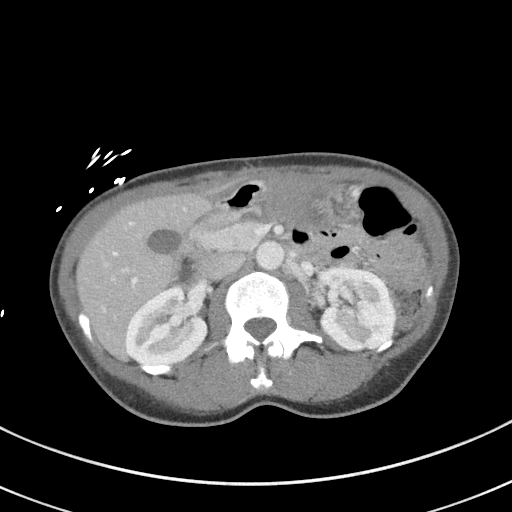
[im 72/90  soft-tissue]
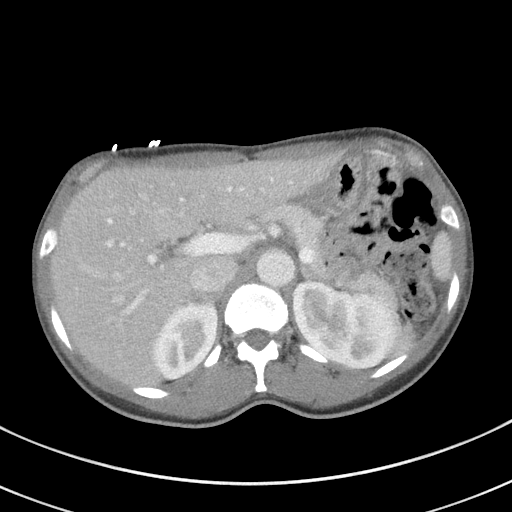
[im 76/90  soft-tissue]
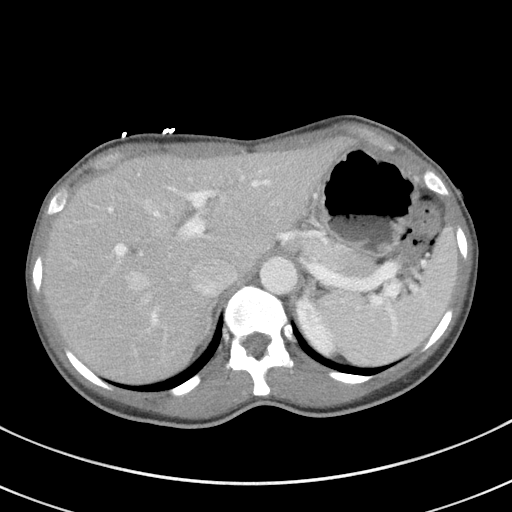
[im 85/90  soft-tissue]
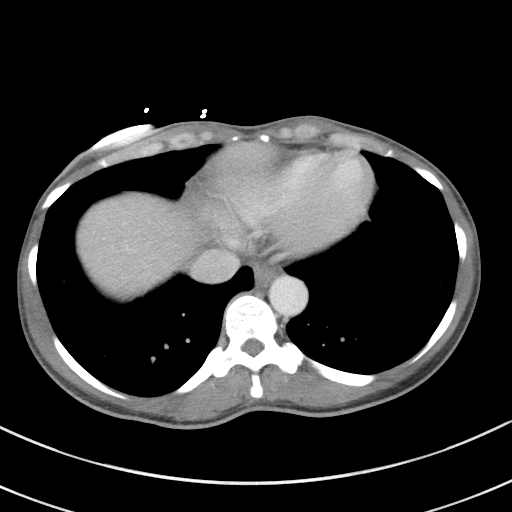

[Series 5: coronal st · coronal · 0.67mm/px · 3 of 81 slices shown]
[im 27/81  soft-tissue]
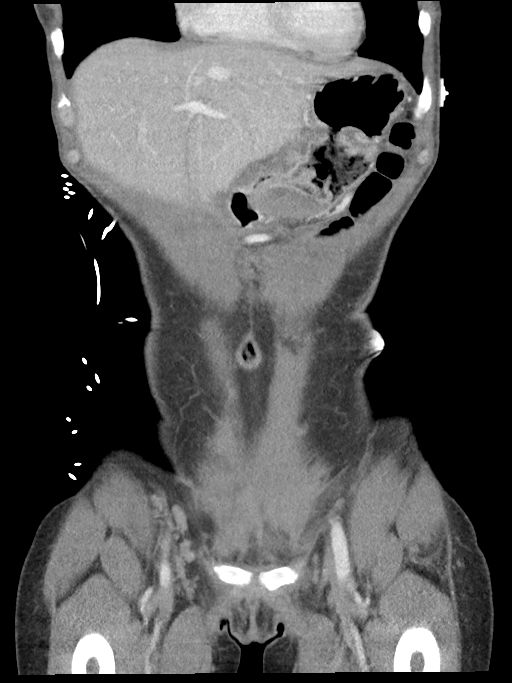
[im 36/81  soft-tissue]
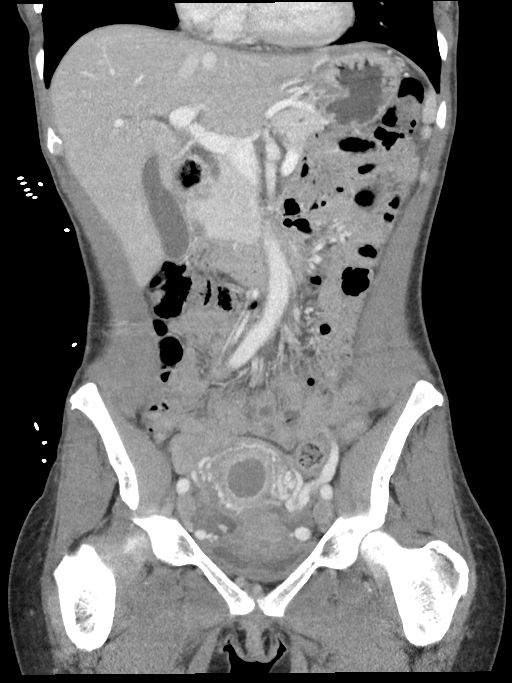
[im 45/81  soft-tissue]
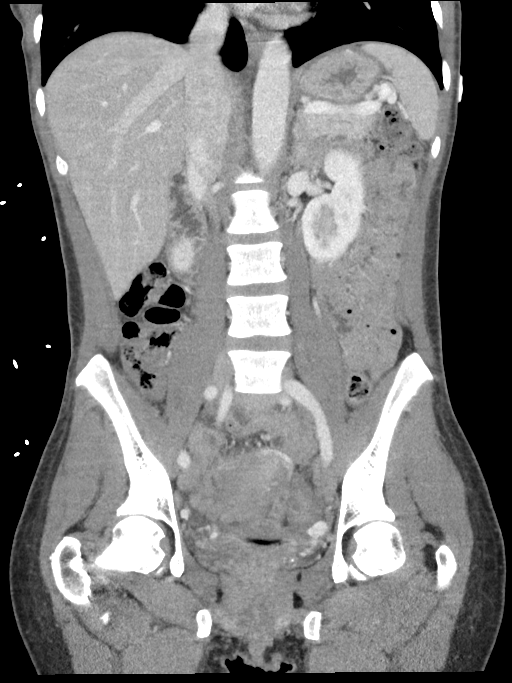

[16 of 46 positions shown; findings below may reference images not displayed]

FINDINGS: Lower chest: The visualized heart size within normal limits. No
pericardial fluid/thickening.

No hiatal hernia.

The visualized portions of the lungs are clear.

Hepatobiliary: The liver is normal in density without focal
abnormality.The main portal vein is patent. No evidence of calcified
gallstones, gallbladder wall thickening or biliary dilatation.

Pancreas: Unremarkable. No pancreatic ductal dilatation or
surrounding inflammatory changes.

Spleen: Normal in size without focal abnormality.

Adrenals/Urinary Tract: Both adrenal glands appear normal. The
kidneys and collecting system appear normal without evidence of
urinary tract calculus or hydronephrosis. Bladder is unremarkable.

Stomach/Bowel: The stomach and small bowel are grossly unremarkable.
There is question of mild wall thickening the sigmoid colon rectum.
Adjacent to the rectum on series 2, image 71 there is a tiny pocket
of fluid which represent within a nondistended loop bowel.

Vascular/Lymphatic: There are no enlarged mesenteric,
retroperitoneal, or pelvic lymph nodes. No significant vascular
findings are present.

Reproductive: Fluid is seen within the endometrial canal distending
the superior uterus.

Other: A small amount of free fluid seen within the deep pelvis.

Musculoskeletal: No acute or significant osseous findings.
IMPRESSION: Findings which may be suggestive mild sigmoid colitis and
proctocolitis. There is also a tiny of fluid within the deep pelvis
which may be in a partially distended loop bowel.

Fluid within a distended superior endometrial canal.

Small amount of free fluid within the deep pelvis.

## 2020-07-17 IMAGING — CR DG CHEST 2V
1 series · 2 of 2 positions shown · non-contrast
Comparison: [DATE]

CLINICAL DATA: Central chest pain radiating to LEFT side, shortness
of breath, LEFT arm numbness for 1 day, history asthma,
hypertension, smoker, prior seizure

EXAM:
CHEST - 2 VIEW

[Series 1: dg chest 2 view · 0.14mm/px · 2 of 2 slices shown]
[im 1/2]
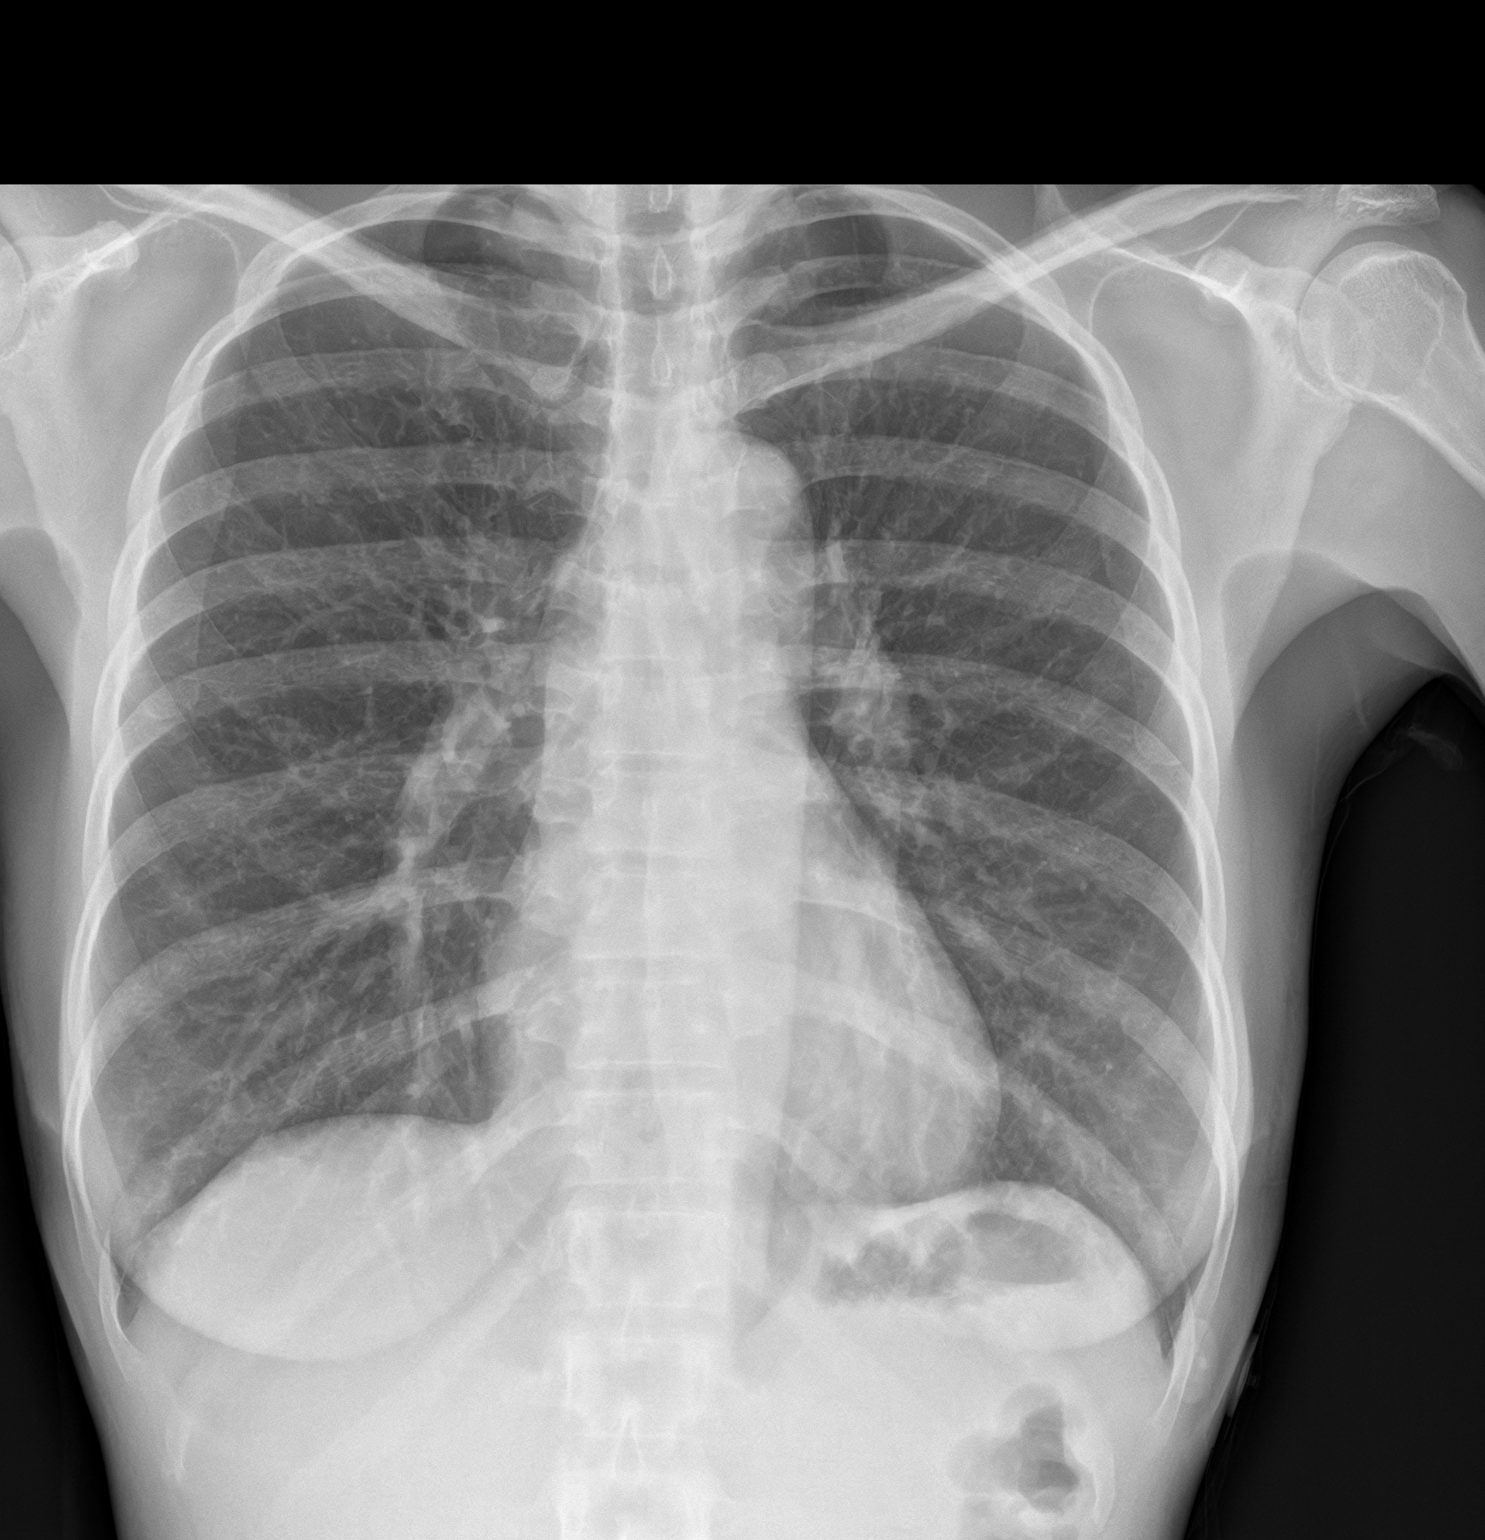
[im 2/2]
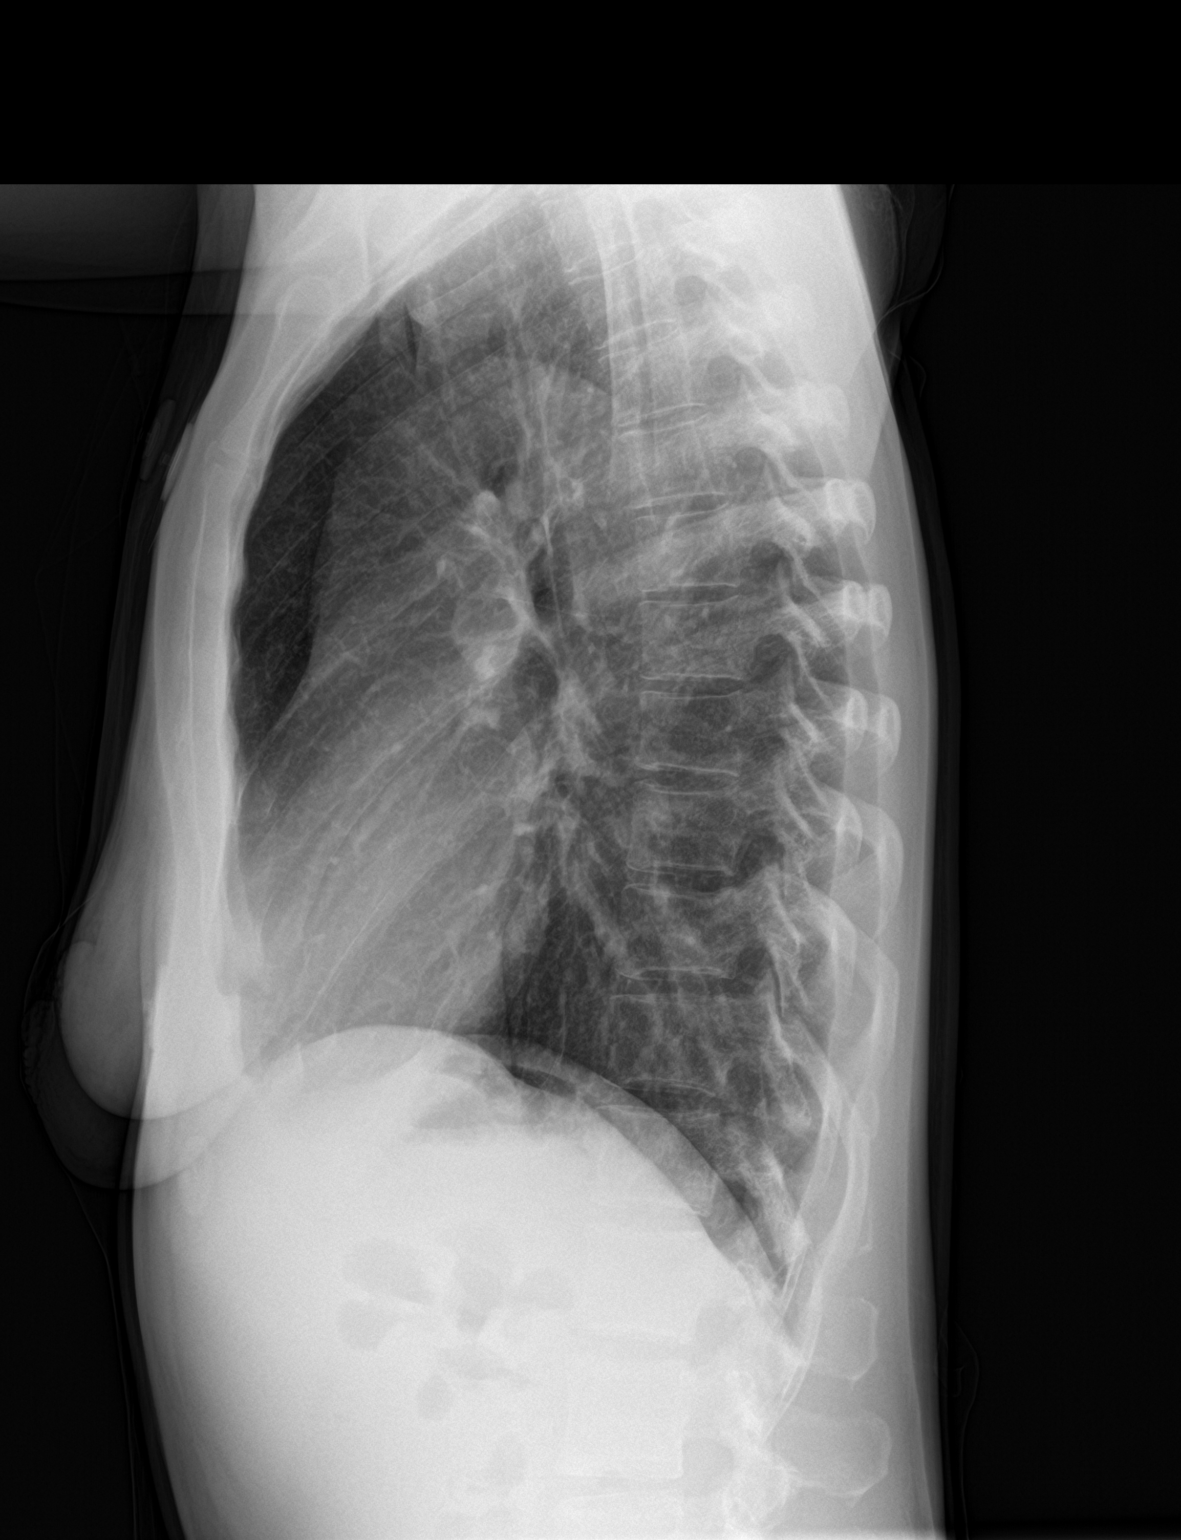

[2 of 2 positions shown; findings below may reference images not displayed]

FINDINGS: Normal heart size, mediastinal contours, and pulmonary vascularity.

Lungs clear.

No pleural effusion or pneumothorax.

Bones unremarkable.
IMPRESSION: No acute abnormalities.

## 2020-07-17 MED ORDER — ONDANSETRON HCL 4 MG/2ML IJ SOLN
4.0000 mg | Freq: Once | INTRAMUSCULAR | Status: AC
  Administered 2020-07-17: 4 mg via INTRAVENOUS
  Filled 2020-07-17: qty 2

## 2020-07-17 MED ORDER — METRONIDAZOLE 500 MG PO TABS: 500.0000 mg | ORAL_TABLET | Freq: Three times a day (TID) | ORAL | 0 refills | Status: AC

## 2020-07-17 MED ORDER — CIPROFLOXACIN HCL 500 MG PO TABS: 500.0000 mg | ORAL_TABLET | Freq: Two times a day (BID) | ORAL | 0 refills | Status: AC

## 2020-07-17 MED ORDER — KETOROLAC TROMETHAMINE 30 MG/ML IJ SOLN
30.0000 mg | Freq: Once | INTRAMUSCULAR | Status: AC
  Administered 2020-07-17: 30 mg via INTRAVENOUS
  Filled 2020-07-17: qty 1

## 2020-07-17 MED ORDER — IOHEXOL 300 MG/ML  SOLN
100.0000 mL | Freq: Once | INTRAMUSCULAR | Status: AC | PRN
  Administered 2020-07-17: 100 mL via INTRAVENOUS

## 2020-07-17 MED ORDER — ONDANSETRON HCL 4 MG/2ML IJ SOLN
INTRAMUSCULAR | Status: AC
  Administered 2020-07-17: 4 mg via INTRAVENOUS
  Filled 2020-07-17: qty 2

## 2020-07-17 MED ORDER — CIPROFLOXACIN HCL 500 MG PO TABS
500.0000 mg | ORAL_TABLET | Freq: Once | ORAL | Status: AC
  Administered 2020-07-17: 500 mg via ORAL
  Filled 2020-07-17 (×2): qty 1

## 2020-07-17 MED ORDER — METRONIDAZOLE 500 MG PO TABS
500.0000 mg | ORAL_TABLET | Freq: Once | ORAL | Status: AC
  Administered 2020-07-17: 500 mg via ORAL
  Filled 2020-07-17 (×2): qty 1

## 2020-07-17 MED ORDER — ONDANSETRON HCL 4 MG/2ML IJ SOLN: 4.0000 mg | Freq: Once | INTRAMUSCULAR | Status: AC

## 2020-07-17 MED ORDER — ONDANSETRON 4 MG PO TBDP: 4.0000 mg | ORAL_TABLET | Freq: Three times a day (TID) | ORAL | 0 refills | Status: DC | PRN

## 2020-07-17 NOTE — ED Triage Notes (Signed)
Pt arrived via POV with reports of chest pain that is sharp in nature, sxs started yesterday.

## 2020-07-17 NOTE — ED Notes (Signed)
Pt to ED stating CP that is "stabbing"  that is mostly felt with inspiration and expiration. Not radiating. Pain started yesterday. Denies pain, burning with urination or vaginal bleeding. Pt suffers from constipation, LBM was 2d ago on 11/5. Husband at bedside.

## 2020-07-17 NOTE — ED Notes (Signed)
Pt now having nausea.

## 2020-07-17 NOTE — ED Notes (Signed)
EDP aware of L arm numbness.

## 2020-07-17 NOTE — ED Provider Notes (Signed)
[facility] Emergency Department Provider Note   ____________________________________________   First MD Initiated Contact with Patient 07/17/20 1625     (approximate)  I have reviewed the triage vital signs and the nursing notes.   HISTORY  Chief Complaint Chest Pain    HPI NIL Shirley [name] is a 44 y.o. female with a stated past medical history of asthma, recurrent migraines, GERD, and recent ventral hernia repair with mesh 6 months prior to arrival who presents for midepigastric abdominal pain that radiates into her chest and is described as sharp, 7/10, and worsened with attempting to sit up or with movement.  This pain is partially relieved with over-the-counter NSAIDs and laying still.         Past Medical History:  Diagnosis Date  . Anemia   . Asthma   . Bacteremia   . Blood dyscrasia    has to apply a lot of pressure to stop bleeding  . GERD (gastroesophageal reflux disease)   . Heart murmur    with pregnancy  . History of hiatal hernia   . Hypertension    4-5 years ago, no meds  . Kidney infection    patient states I might have a kidney infection  . Migraine    migraines 1-2x/month  . Pyelonephritis   . Seizure (HCC) 05/09/2017   due to low potassium  . Wears dentures    partial upper and lower    Patient Active Problem List   Diagnosis Date Noted  . Iron deficiency anemia 08/10/2019  . Acute peptic ulcer of stomach   . Dysphagia, idiopathic   . Nausea   . Abnormal CT scan, colon   . Pyelonephritis 08/03/2017  . Bacteremia 08/03/2017  . Hypokalemia 08/03/2017  . Tobacco use 08/03/2017  . Acute postoperative pain 07/31/2016  . Menometrorrhagia 04/09/2016  . Anemia 04/09/2016  . Abnormal uterine bleeding (AUB) 04/09/2016  . Essential hypertension 11/17/2009    Past Surgical History:  Procedure Laterality Date  . ABDOMINAL HERNIA REPAIR    . CESAREAN SECTION     x 4  . CESAREAN SECTION     x 4  . COLONOSCOPY  WITH PROPOFOL N/A 07/24/2019   Procedure: COLONOSCOPY WITH PROPOFOL;  Surgeon: Pasty Spillers, MD;  Location: [facility];  Service: Endoscopy;  Laterality: N/A;  . ENDOMETRIAL ABLATION  07/31/2016  . ESOPHAGOGASTRODUODENOSCOPY (EGD) WITH PROPOFOL N/A 07/24/2019   Procedure: ESOPHAGOGASTRODUODENOSCOPY (EGD) WITH PROPOFOL;  Surgeon: Pasty Spillers, MD;  Location: [facility];  Service: Endoscopy;  Laterality: N/A;  . HYSTEROSCOPY  07/31/2016   Procedure: HYSTEROSCOPY WITH HYDROTHERMAL ABLATION;  Surgeon: [place] Bing, MD;  Location: [facility];  Service: Gynecology;;    Prior to Admission medications   Medication Sig Start Date End Date Taking? Authorizing Provider  acetaminophen (TYLENOL) 500 MG tablet Take 2 tablets (1,000 mg total) by mouth every 8 (eight) hours as needed for mild pain, moderate pain, fever or headache. 05/07/20 05/07/21  Nita Sickle, MD  albuterol (PROVENTIL HFA;VENTOLIN HFA) 108 (90 Base) MCG/ACT inhaler Inhale 2 puffs into the lungs every 6 (six) hours as needed for wheezing or shortness of breath.    [provider]  amLODipine (NORVASC) 2.5 MG tablet Take 2.5 mg by mouth daily. 01/08/18   [provider]  Ascorbic Acid (VITAMIN C PO) Take by mouth daily.    [provider]  ciprofloxacin (CIPRO) 500 MG tablet Take 1 tablet (500 mg total) by mouth 2 (two) times daily  for 7 days. 07/17/20 07/24/20  Merwyn Katos, MD  gabapentin (NEURONTIN) 100 MG capsule Take 1 capsule by mouth 1 day or 1 dose. 11/12/19   [provider]  magnesium oxide (MAG-OX) 400 MG tablet Take 400 mg by mouth 2 (two) times daily.    [provider]  metroNIDAZOLE (FLAGYL) 500 MG tablet Take 1 tablet (500 mg total) by mouth 3 (three) times daily for 7 days. 07/17/20 07/24/20  Merwyn Katos, MD  Multiple Vitamins-Minerals (WOMENS DAILY FORMULA PO) Take 1 tablet by mouth daily.    [provider]  nitrofurantoin,  macrocrystal-monohydrate, (MACROBID) 100 MG capsule Take 100 mg by mouth 2 (two) times daily. 11/12/19   [provider]  omeprazole (PRILOSEC) 40 MG capsule Take 1 capsule (40 mg total) by mouth 2 (two) times daily. 09/01/19 11/13/19  Pasty Spillers, MD  ondansetron (ZOFRAN ODT) 4 MG disintegrating tablet Take 1 tablet (4 mg total) by mouth every 8 (eight) hours as needed. 05/07/20   Nita Sickle, MD  ondansetron (ZOFRAN ODT) 4 MG disintegrating tablet Take 1 tablet (4 mg total) by mouth every 8 (eight) hours as needed for nausea or vomiting. 07/17/20   Merwyn Katos, MD  polyethylene glycol (MIRALAX / GLYCOLAX) packet Take 17 g by mouth daily as needed for mild constipation.     [provider]  Potassium Chloride ER 20 MEQ TBCR Take 1 tablet by mouth daily. 11/12/19   [provider]    Allergies Lisinopril, Aspirin, Ibuprofen, and Other  Family History  Problem Relation Age of Onset  . Hypertension Mother   . Colon cancer Father 60  . Stroke Brother     Social History Social History   Tobacco Use  . Smoking status: Current Every Day Smoker    Packs/day: 0.25    Types: Cigarettes  . Smokeless tobacco: Never Used  . Tobacco comment: 1 black and mild/day  Vaping Use  . Vaping Use: Never used  Substance Use Topics  . Alcohol use: Yes    Comment: occasionally  . Drug use: Yes    Types: Marijuana    Comment: 10/25/2019    Review of Systems Constitutional: No fever/chills Eyes: No visual changes. ENT: No sore throat. Cardiovascular: Endorses chest pain. Respiratory: Denies shortness of breath. Gastrointestinal: Endorses abdominal pain.  No nausea, no vomiting.  No diarrhea. Genitourinary: Negative for dysuria. Musculoskeletal: Negative for acute arthralgias Skin: Negative for rash. Neurological: Negative for headaches, weakness/numbness/paresthesias in any extremity Psychiatric: Negative for suicidal ideation/homicidal  ideation   ____________________________________________   PHYSICAL EXAM:  VITAL SIGNS: ED Triage Vitals  Enc Vitals Group     BP 07/17/20 1502 (!) 169/99     Pulse Rate 07/17/20 1502 87     Resp 07/17/20 1502 18     Temp 07/17/20 1502 98.9 F (37.2 C)     Temp Source 07/17/20 1502 Oral     SpO2 07/17/20 1502 100 %     Weight 07/17/20 1511 120 lb (54.4 kg)     Height 07/17/20 1511 5\' 2"  (1.575 m)     Head Circumference --      Peak Flow --      Pain Score 07/17/20 1510 9     Pain Loc --      Pain Edu? --      Excl. in GC? --    Constitutional: Alert and oriented. Well appearing and in no acute distress. Eyes: Conjunctivae are normal. PERRL. Head: Atraumatic. Nose:  No congestion/rhinnorhea. Mouth/Throat: Mucous membranes are moist. Neck: No stridor Cardiovascular: Grossly normal heart sounds.  Good peripheral circulation. Respiratory: Normal respiratory effort.  No retractions. Gastrointestinal: Soft.  Tenderness to palpation over upper midline ventral region no distention. Musculoskeletal: No obvious deformities Neurologic:  Normal speech and language. No gross focal neurologic deficits are appreciated. Skin:  Skin is warm and dry. No rash noted. Psychiatric: Mood and affect are normal. Speech and behavior are normal.  ____________________________________________   LABS (all labs ordered are listed, but only abnormal results are displayed)  Labs Reviewed  BASIC METABOLIC PANEL - Abnormal; Notable for the following components:      Result Value   Potassium 3.2 (*)    All other components within normal limits  CBC - Abnormal; Notable for the following components:   RBC 3.80 (*)    All other components within normal limits  POC URINE PREG, ED  TROPONIN I (HIGH SENSITIVITY)  TROPONIN I (HIGH SENSITIVITY)   ____________________________________________  EKG  ED ECG REPORT I, Merwyn KatosEvan K Tyquisha Sharps, the attending physician, personally viewed and interpreted this  ECG.  Date: 07/17/2020 EKG Time: 1452 Rate: 84 Rhythm: normal sinus rhythm QRS Axis: normal Intervals: normal ST/T Wave abnormalities: normal Narrative Interpretation: no evidence of acute ischemia  ____________________________________________  RADIOLOGY  ED MD interpretation: 2 view x-ray of the chest shows no evidence of acute abnormalities including no pneumothorax, pneumonia, or widened mediastinum  CT of the abdomen and pelvis with IV contrast shows mild sigmoid and proctocolitis  Official radiology report(s): DG Chest 2 View  Result Date: 07/17/2020 CLINICAL DATA:  Central chest pain radiating to LEFT side, shortness of breath, LEFT arm numbness for 1 day, history asthma, hypertension, smoker, prior seizure EXAM: CHEST - 2 VIEW COMPARISON:  09/10/2018 FINDINGS: Normal heart size, mediastinal contours, and pulmonary vascularity. Lungs clear. No pleural effusion or pneumothorax. Bones unremarkable. IMPRESSION: No acute abnormalities. Electronically Signed   By: Ulyses SouthwardMark  Boles M.D.   On: 07/17/2020 17:07   CT Abdomen Pelvis W Contrast  Result Date: 07/17/2020 CLINICAL DATA:  Abdominal pain EXAM: CT ABDOMEN AND PELVIS WITH CONTRAST TECHNIQUE: Multidetector CT imaging of the abdomen and pelvis was performed using the standard protocol following bolus administration of intravenous contrast. CONTRAST:  100mL OMNIPAQUE IOHEXOL 300 MG/ML  SOLN COMPARISON:  May 07, 2020 FINDINGS: Lower chest: The visualized heart size within normal limits. No pericardial fluid/thickening. No hiatal hernia. The visualized portions of the lungs are clear. Hepatobiliary: The liver is normal in density without focal abnormality.The main portal vein is patent. No evidence of calcified gallstones, gallbladder wall thickening or biliary dilatation. Pancreas: Unremarkable. No pancreatic ductal dilatation or surrounding inflammatory changes. Spleen: Normal in size without focal abnormality. Adrenals/Urinary Tract: Both  adrenal glands appear normal. The kidneys and collecting system appear normal without evidence of urinary tract calculus or hydronephrosis. Bladder is unremarkable. Stomach/Bowel: The stomach and small bowel are grossly unremarkable. There is question of mild wall thickening the sigmoid colon rectum. Adjacent to the rectum on series 2, image 71 there is a tiny pocket of fluid which represent within a nondistended loop bowel. Vascular/Lymphatic: There are no enlarged mesenteric, retroperitoneal, or pelvic lymph nodes. No significant vascular findings are present. Reproductive: Fluid is seen within the endometrial canal distending the superior uterus. Other: A small amount of free fluid seen within the deep pelvis. Musculoskeletal: No acute or significant osseous findings. IMPRESSION: Findings which may be suggestive mild sigmoid colitis and proctocolitis. There is also a tiny of fluid  within the deep pelvis which may be in a partially distended loop bowel. Fluid within a distended superior endometrial canal. Small amount of free fluid within the deep pelvis. Electronically Signed   By: Jonna Clark M.D.   On: 07/17/2020 19:16    ____________________________________________   PROCEDURES  Procedure(s) performed (including Critical Care):  Procedures   ____________________________________________   INITIAL IMPRESSION / ASSESSMENT AND PLAN / ED COURSE  As part of my medical decision making, I reviewed the following data within the electronic MEDICAL RECORD NUMBER Nursing notes reviewed and incorporated, Labs reviewed, EKG interpreted, Old chart reviewed, Radiograph reviewed and Notes from prior ED visits reviewed and incorporated        Patient has colitis and proctocolitis that is amenable to oral antibiotics. Patient has no peritoneal signs or signs of perforation. Patients symptoms not typical for other emergent causes of abdominal pain such as, but not limited to, appendicitis, abdominal aortic  aneurysm, surgical biliary disease, acute coronary syndrome, etc. Patient was counseled on the importance of gastroenterology follow-up as this is the third bout of colitis the patient has had in the last year concerning for possible Crohn's disease versus ulcerative colitis versus other large intestine disease.  Patient expresses understanding Patient will be discharged with strict return precautions and follow up with primary MD within 12-24 hours for further evaluation.  Patient understands that they may require admission and IV antibiotics and possibly surgery if they do not improve with oral antibiotics.      ____________________________________________   FINAL CLINICAL IMPRESSION(S) / ED DIAGNOSES  Final diagnoses:  Abdominal pain, epigastric  Colitis     ED Discharge Orders         Ordered    ciprofloxacin (CIPRO) 500 MG tablet  2 times daily        07/17/20 1947    metroNIDAZOLE (FLAGYL) 500 MG tablet  3 times daily        07/17/20 1947    ondansetron (ZOFRAN ODT) 4 MG disintegrating tablet  Every 8 hours PRN        07/17/20 2005           Note:  This document was prepared using Dragon voice recognition software and may include unintentional dictation errors.   Merwyn Katos, MD 07/17/20 2041

## 2020-07-17 NOTE — ED Notes (Signed)
L hand grip is slt weaker than R hand on NIH stroke scale.

## 2020-07-17 NOTE — ED Notes (Addendum)
Pt states L arm numbness that started at 0400 this morning. Pt states was being treated for HTN but was taken off meds by Dr because BP was normal.

## 2020-07-17 NOTE — ED Notes (Signed)
Pt back from CT

## 2020-07-20 ENCOUNTER — Emergency Department (HOSPITAL_COMMUNITY): Payer: BC Managed Care – PPO

## 2020-07-20 ENCOUNTER — Other Ambulatory Visit: Payer: Self-pay

## 2020-07-20 ENCOUNTER — Emergency Department (HOSPITAL_COMMUNITY)
Admission: EM | Admit: 2020-07-20 | Discharge: 2020-07-20 | Disposition: A | Discharge status: Home / Self Care | Payer: BC Managed Care – PPO | Attending: Emergency Medicine | Admitting: Emergency Medicine

## 2020-07-20 DIAGNOSIS — I10 Essential (primary) hypertension: Secondary | ICD-10-CM | POA: Insufficient documentation

## 2020-07-20 DIAGNOSIS — J45909 Unspecified asthma, uncomplicated: Secondary | ICD-10-CM | POA: Diagnosis not present

## 2020-07-20 DIAGNOSIS — K2900 Acute gastritis without bleeding: Secondary | ICD-10-CM | POA: Insufficient documentation

## 2020-07-20 DIAGNOSIS — F17210 Nicotine dependence, cigarettes, uncomplicated: Secondary | ICD-10-CM | POA: Diagnosis not present

## 2020-07-20 DIAGNOSIS — R1084 Generalized abdominal pain: Secondary | ICD-10-CM | POA: Diagnosis not present

## 2020-07-20 DIAGNOSIS — Z79899 Other long term (current) drug therapy: Secondary | ICD-10-CM | POA: Insufficient documentation

## 2020-07-20 DIAGNOSIS — R112 Nausea with vomiting, unspecified: Secondary | ICD-10-CM | POA: Diagnosis not present

## 2020-07-20 DIAGNOSIS — R1110 Vomiting, unspecified: Secondary | ICD-10-CM | POA: Diagnosis not present

## 2020-07-20 DIAGNOSIS — R109 Unspecified abdominal pain: Secondary | ICD-10-CM | POA: Diagnosis not present

## 2020-07-20 LAB — CBC
HCT: 39.6 % (ref 36.0–46.0)
Hemoglobin: 13.2 g/dL (ref 12.0–15.0)
MCH: 33.2 pg (ref 26.0–34.0)
MCHC: 33.3 g/dL (ref 30.0–36.0)
MCV: 99.7 fL (ref 80.0–100.0)
Platelets: 410 10*3/uL — ABNORMAL HIGH (ref 150–400)
RBC: 3.97 MIL/uL (ref 3.87–5.11)
RDW: 13 % (ref 11.5–15.5)
WBC: 5.4 10*3/uL (ref 4.0–10.5)
nRBC: 0 % (ref 0.0–0.2)

## 2020-07-20 LAB — COMPREHENSIVE METABOLIC PANEL
ALT: 12 U/L (ref 0–44)
AST: 15 U/L (ref 15–41)
Albumin: 4.6 g/dL (ref 3.5–5.0)
Alkaline Phosphatase: 44 U/L (ref 38–126)
Anion gap: 12 (ref 5–15)
BUN: 9 mg/dL (ref 6–20)
CO2: 22 mmol/L (ref 22–32)
Calcium: 10 mg/dL (ref 8.9–10.3)
Chloride: 104 mmol/L (ref 98–111)
Creatinine, Ser: 0.62 mg/dL (ref 0.44–1.00)
GFR, Estimated: >60 mL/min (ref >60)
Glucose, Bld: 97 mg/dL (ref 70–99)
Potassium: 3.2 mmol/L — ABNORMAL LOW (ref 3.5–5.1)
Sodium: 138 mmol/L (ref 135–145)
Total Bilirubin: 0.6 mg/dL (ref 0.3–1.2)
Total Protein: 7.8 g/dL (ref 6.5–8.1)

## 2020-07-20 LAB — URINALYSIS, ROUTINE W REFLEX MICROSCOPIC
Bilirubin Urine: NEGATIVE
Glucose, UA: NEGATIVE mg/dL
Ketones, ur: 20 mg/dL — AB
Leukocytes,Ua: NEGATIVE
Nitrite: NEGATIVE
Protein, ur: 30 mg/dL — AB
Specific Gravity, Urine: 1.02 (ref 1.005–1.030)
pH: 7 (ref 5.0–8.0)

## 2020-07-20 LAB — LIPASE, BLOOD: Lipase: 21 U/L (ref 11–51)

## 2020-07-20 MED ORDER — MORPHINE SULFATE (PF) 2 MG/ML IV SOLN
4.0000 mg | Freq: Once | INTRAVENOUS | Status: AC
  Administered 2020-07-20: 4 mg via INTRAVENOUS
  Filled 2020-07-20: qty 2

## 2020-07-20 MED ORDER — SODIUM CHLORIDE 0.9 % IV BOLUS
1000.0000 mL | Freq: Once | INTRAVENOUS | Status: AC
  Administered 2020-07-20: 1000 mL via INTRAVENOUS

## 2020-07-20 MED ORDER — SUCRALFATE 1 G PO TABS: 1.0000 g | ORAL_TABLET | Freq: Three times a day (TID) | ORAL | 0 refills | Status: DC

## 2020-07-20 MED ORDER — ONDANSETRON HCL 4 MG/2ML IJ SOLN
4.0000 mg | Freq: Once | INTRAMUSCULAR | Status: AC
  Administered 2020-07-20: 4 mg via INTRAVENOUS
  Filled 2020-07-20: qty 2

## 2020-07-20 MED ORDER — ALUM & MAG HYDROXIDE-SIMETH 200-200-20 MG/5ML PO SUSP
30.0000 mL | Freq: Once | ORAL | Status: AC
  Administered 2020-07-20: 30 mL via ORAL
  Filled 2020-07-20: qty 30

## 2020-07-20 MED ORDER — PANTOPRAZOLE SODIUM 40 MG IV SOLR
40.0000 mg | Freq: Once | INTRAVENOUS | Status: AC
  Administered 2020-07-20: 40 mg via INTRAVENOUS
  Filled 2020-07-20: qty 40

## 2020-07-20 MED ORDER — LIDOCAINE VISCOUS HCL 2 % MT SOLN
15.0000 mL | Freq: Once | OROMUCOSAL | Status: AC
  Administered 2020-07-20: 15 mL via ORAL
  Filled 2020-07-20: qty 15

## 2020-07-20 MED ORDER — ONDANSETRON 4 MG PO TBDP: ORAL_TABLET | ORAL | 0 refills | Status: DC

## 2020-07-20 MED ORDER — OMEPRAZOLE 40 MG PO CPDR: 40.0000 mg | DELAYED_RELEASE_CAPSULE | Freq: Two times a day (BID) | ORAL | 0 refills | Status: DC

## 2020-07-20 MED ORDER — PROMETHAZINE HCL 25 MG/ML IJ SOLN
25.0000 mg | Freq: Once | INTRAMUSCULAR | Status: AC
  Administered 2020-07-20: 25 mg via INTRAVENOUS
  Filled 2020-07-20: qty 1

## 2020-07-20 MED ORDER — IOHEXOL 300 MG/ML  SOLN
100.0000 mL | Freq: Once | INTRAMUSCULAR | Status: AC | PRN
  Administered 2020-07-20: 100 mL via INTRAVENOUS

## 2020-07-20 NOTE — ED Notes (Signed)
Pt ate snacks and drank soda, denies n/v. States she if feeling better

## 2020-07-20 NOTE — ED Provider Notes (Signed)
[facility] EMERGENCY DEPARTMENT Provider Note   CSN: [id number] Arrival date & time: 07/20/20  1223     History Chief Complaint  Patient presents with  . Emesis  . Abdominal Pain    Shirley [name] is a 44 y.o. female.  Shirley [name] is a 44 y.o. female with a history of GERD, hypertension, heart murmur, migraines, seizures, asthma, who presents to the emergency department for evaluation of abdominal pain.  Symptoms first began on Friday, she was seen in the emergency department at [facility] on Sunday she states that she was diagnosed with proctocolitis, she was prescribed antibiotics for treatment as well as Zofran for nausea and vomiting.  She has not been having any diarrhea and in fact has experienced some constipation, no blood in her stools.  No fevers or chills.  She states she saw her PCP today after she has had continued vomiting and has not been able to keep down fluids or her antibiotics.  Her PCP sent her to the ED for further evaluation.  She reports abdominal pain has been eating worse despite medicines it is present across her upper abdomen.  Does not localize to one side.  She states that pain is worse with vomiting.  She has not been able to keep anything down and has been dry heaving.  She has tried taking the Zofran regularly but it does not seem to help.  No dysuria or urinary frequency.  No chest pain or shortness of breath.  No cough.  Despite being diagnosed with proctocolitis at her last visit she has not had lower abdominal pain, rectal pain or diarrhea.  No history of abdominal surgeries or other GI issues.  No other aggravating or alleviating factors.        Past Medical History:  Diagnosis Date  . Anemia   . Asthma   . Bacteremia   . Blood dyscrasia    has to apply a lot of pressure to stop bleeding  . GERD (gastroesophageal reflux disease)   . Heart murmur    with pregnancy  . History of hiatal hernia   . Hypertension     4-5 years ago, no meds  . Kidney infection    patient states I might have a kidney infection  . Migraine    migraines 1-2x/month  . Pyelonephritis   . Seizure (HCC) 05/09/2017   due to low potassium  . Wears dentures    partial upper and lower    Patient Active Problem List   Diagnosis Date Noted  . Iron deficiency anemia 08/10/2019  . Acute peptic ulcer of stomach   . Dysphagia, idiopathic   . Nausea   . Abnormal CT scan, colon   . Pyelonephritis 08/03/2017  . Bacteremia 08/03/2017  . Hypokalemia 08/03/2017  . Tobacco use 08/03/2017  . Acute postoperative pain 07/31/2016  . Menometrorrhagia 04/09/2016  . Anemia 04/09/2016  . Abnormal uterine bleeding (AUB) 04/09/2016  . Essential hypertension 11/17/2009    Past Surgical History:  Procedure Laterality Date  . ABDOMINAL HERNIA REPAIR    . CESAREAN SECTION     x 4  . CESAREAN SECTION     x 4  . COLONOSCOPY WITH PROPOFOL N/A 07/24/2019   Procedure: COLONOSCOPY WITH PROPOFOL;  Surgeon: Pasty Spillers, MD;  Location: [facility];  Service: Endoscopy;  Laterality: N/A;  . ENDOMETRIAL ABLATION  07/31/2016  . ESOPHAGOGASTRODUODENOSCOPY (EGD) WITH PROPOFOL N/A 07/24/2019   Procedure: ESOPHAGOGASTRODUODENOSCOPY (EGD) WITH PROPOFOL;  Surgeon:  Pasty Spillers, MD;  Location: [facility];  Service: Endoscopy;  Laterality: N/A;  . HYSTEROSCOPY  07/31/2016   Procedure: HYSTEROSCOPY WITH HYDROTHERMAL ABLATION;  Surgeon: [place] Bing, MD;  Location: [facility];  Service: Gynecology;;     OB History    Gravida  5   Para  4   Term  3   Preterm  1   AB  1   Living  4     SAB      TAB      Ectopic      Multiple      Live Births           Obstetric Comments  c--section x 4. 36wk c-section x 1        Family History  Problem Relation Age of Onset  . Hypertension Mother   . Colon cancer Father 48  . Stroke Brother     Social History   Tobacco Use  . Smoking status: Current  Every Day Smoker    Packs/day: 0.25    Types: Cigarettes  . Smokeless tobacco: Never Used  . Tobacco comment: 1 black and mild/day  Vaping Use  . Vaping Use: Never used  Substance Use Topics  . Alcohol use: Yes    Comment: occasionally  . Drug use: Yes    Types: Marijuana    Comment: 10/25/2019    Home Medications Prior to Admission medications   Medication Sig Start Date End Date Taking? Authorizing Provider  acetaminophen (TYLENOL) 500 MG tablet Take 2 tablets (1,000 mg total) by mouth every 8 (eight) hours as needed for mild pain, moderate pain, fever or headache. 05/07/20 05/07/21  Nita Sickle, MD  albuterol (PROVENTIL HFA;VENTOLIN HFA) 108 (90 Base) MCG/ACT inhaler Inhale 2 puffs into the lungs every 6 (six) hours as needed for wheezing or shortness of breath.    [provider]  amLODipine (NORVASC) 2.5 MG tablet Take 2.5 mg by mouth daily. 01/08/18   [provider]  Ascorbic Acid (VITAMIN C PO) Take by mouth daily.    [provider]  ciprofloxacin (CIPRO) 500 MG tablet Take 1 tablet (500 mg total) by mouth 2 (two) times daily for 7 days. 07/17/20 07/24/20  Merwyn Katos, MD  gabapentin (NEURONTIN) 100 MG capsule Take 1 capsule by mouth 1 day or 1 dose. 11/12/19   [provider]  magnesium oxide (MAG-OX) 400 MG tablet Take 400 mg by mouth 2 (two) times daily.    [provider]  metroNIDAZOLE (FLAGYL) 500 MG tablet Take 1 tablet (500 mg total) by mouth 3 (three) times daily for 7 days. 07/17/20 07/24/20  Merwyn Katos, MD  Multiple Vitamins-Minerals (WOMENS DAILY FORMULA PO) Take 1 tablet by mouth daily.    [provider]  nitrofurantoin, macrocrystal-monohydrate, (MACROBID) 100 MG capsule Take 100 mg by mouth 2 (two) times daily. 11/12/19   [provider]  omeprazole (PRILOSEC) 40 MG capsule Take 1 capsule (40 mg total) by mouth 2 (two) times daily. 07/20/20 08/19/20  Dartha Lodge, PA-C  ondansetron (ZOFRAN ODT)  4 MG disintegrating tablet 4mg  ODT q4 hours prn nausea/vomit 07/20/20   13/10/21, PA-C  polyethylene glycol [facility] / GLYCOLAX) packet Take 17 g by mouth daily as needed for mild constipation.     [provider]  Potassium Chloride ER 20 MEQ TBCR Take 1 tablet by mouth daily. 11/12/19   [provider]  sucralfate (CARAFATE) 1 g tablet Take 1 tablet (  1 g total) by mouth 4 (four) times daily -  with meals and at bedtime. 07/20/20 08/19/20  Dartha Lodge, PA-C    Allergies    Lisinopril, Aspirin, Ibuprofen, and Other  Review of Systems   Review of Systems  Constitutional: Negative for chills and fever.  HENT: Negative.   Respiratory: Negative for cough and shortness of breath.   Cardiovascular: Negative for chest pain.  Gastrointestinal: Positive for abdominal pain, nausea and vomiting.  Genitourinary: Negative for dysuria and frequency.  Musculoskeletal: Negative for arthralgias and myalgias.  Skin: Negative for color change and rash.  Neurological: Negative for dizziness, syncope and light-headedness.  All other systems reviewed and are negative.   Physical Exam Updated Vital Signs BP (!) 135/99 (BP Location: Left Arm)   Pulse 92   Temp 98.7 F (37.1 C) (Oral)   Resp 16   Ht 5\' 2"  (1.575 m)   Wt 54 kg   SpO2 99%   BMI 21.77 kg/m   Physical Exam Vitals and nursing note reviewed.  Constitutional:      General: She is not in acute distress.    Appearance: She is well-developed. She is not diaphoretic.     Comments: Pt appears uncomfortable dry heaving, non-toxic appearing  HENT:     Head: Normocephalic and atraumatic.     Mouth/Throat:     Mouth: Mucous membranes are dry.     Pharynx: Oropharynx is clear.  Eyes:     General:        Right eye: No discharge.        Left eye: No discharge.  Cardiovascular:     Rate and Rhythm: Normal rate and regular rhythm.     Heart sounds: Normal heart sounds. No murmur heard.  No friction rub. No gallop.    Pulmonary:     Effort: Pulmonary effort is normal. No respiratory distress.     Breath sounds: Normal breath sounds. No wheezing or rales.  Abdominal:     General: Bowel sounds are normal. There is no distension.     Palpations: Abdomen is soft. There is no mass.     Tenderness: There is abdominal tenderness. There is no guarding.     Comments: Abdomen is sot, non-distended, bowel sounds present throughout, there is tenderness across the upper abdomen with slight guarding, no lower abdominal tenderness  Musculoskeletal:        General: No deformity.     Cervical back: Neck supple.  Skin:    General: Skin is warm and dry.     Capillary Refill: Capillary refill takes less than 2 seconds.  Neurological:     Mental Status: She is alert and oriented to person, place, and time.     Coordination: Coordination normal.     Comments: Speech is clear, able to follow commands Moves extremities without ataxia, coordination intact  Psychiatric:        Mood and Affect: Mood normal.        Behavior: Behavior normal.     ED Results / Procedures / Treatments   Labs (all labs ordered are listed, but only abnormal results are displayed) Labs Reviewed  COMPREHENSIVE METABOLIC PANEL - Abnormal; Notable for the following components:      Result Value   Potassium 3.2 (*)    All other components within normal limits  CBC - Abnormal; Notable for the following components:   Platelets 410 (*)    All other components within normal limits  URINALYSIS, ROUTINE W  REFLEX MICROSCOPIC - Abnormal; Notable for the following components:   Color, Urine AMBER (*)    APPearance CLOUDY (*)    Hgb urine dipstick SMALL (*)    Ketones, ur 20 (*)    Protein, ur 30 (*)    Bacteria, UA RARE (*)    All other components within normal limits  LIPASE, BLOOD    EKG EKG Interpretation  Date/Time:  Wednesday July 20 2020 19:46:28 EST Ventricular Rate:  82 PR Interval:  160 QRS Duration: 76 QT  Interval:  376 QTC Calculation: 439 R Axis:   78 Text Interpretation: Normal sinus rhythm Septal infarct , age undetermined Abnormal ECG No significant change since last tracing Confirmed by Vanetta Mulders [phone]) on 07/20/2020 8:02:41 PM   Radiology CT ABDOMEN PELVIS W CONTRAST  Result Date: 07/20/2020 CLINICAL DATA:  Vomiting. Acute abdominal pain. Worsening abdominal pain. Patient seen 3 days prior with similar findings EXAM: CT ABDOMEN AND PELVIS WITH CONTRAST TECHNIQUE: Multidetector CT imaging of the abdomen and pelvis was performed using the standard protocol following bolus administration of intravenous contrast. CONTRAST:  100 mL Omnipaque 300 COMPARISON:  CT 07/18/2019 FINDINGS: Lower chest: Lung bases are clear. Hepatobiliary: No focal hepatic lesion. No biliary duct dilatation. Common bile duct is normal. Pancreas: Pancreas is normal. No ductal dilatation. No pancreatic inflammation. Spleen: Normal spleen Adrenals/urinary tract: Adrenal glands and kidneys are normal. The ureters and bladder normal. Stomach/Bowel: proximal stomach is normal. There is circumferential thickening of the gastric wall through the pyloric region seen on coronal image 18/series 6. There is some thickening on comparison CT 3 days prior. This submucosal thickening extends over approximately 4 cm again through the pyloric region. The duodenum appears normal. The small bowel appears normal. There is limited intra-abdominal fat which does limit evaluation. Appendix is normal. The ascending, transverse and descending colon appear normal. Rectosigmoid colon normal. There is small amount free fluid the pelvis. Vascular/Lymphatic: Abdominal aorta is normal caliber. No periportal or retroperitoneal adenopathy. No pelvic adenopathy. Reproductive: The endometrial canal is distended by intermediate density fluid. The canal is distended to 2.3 cm. There is hyperemia of the endometrial cavity. Findings similar to 3 days prior with  endometrial cavity measured 2.5 cm. RIGHT ovary appears normal measuring 17 mm image 59/3. LEFT ovary is difficult to define. Other: Small free fluid the pelvis.  No intraperitoneal free air. Musculoskeletal: No aggressive osseous lesion. IMPRESSION: 1. Marked circumferential submucosal thickening through the pyloric region of the stomach suggests gastritis. Findings similar to several days prior. This potentially could result in patient vomiting. 2. Small bowel colon appear normal.  Appendix normal. 3. Fluid distension of the endometrial canal. Cannot exclude a cervical obstruction. Recommend follow-up pelvic ultrasound in 3 to 6 weeks to demonstrate resolution. Electronically Signed   By: Genevive Bi M.D.   On: 07/20/2020 18:37    Procedures Procedures (including critical care time)  Medications Ordered in ED Medications  sodium chloride 0.9 % bolus 1,000 mL (0 mLs Intravenous Stopped 07/20/20 1809)  ondansetron (ZOFRAN) injection 4 mg (4 mg Intravenous Given 07/20/20 1646)  morphine 2 MG/ML injection 4 mg (4 mg Intravenous Given 07/20/20 1647)  iohexol (OMNIPAQUE) 300 MG/ML solution 100 mL (100 mLs Intravenous Contrast Given 07/20/20 1752)  pantoprazole (PROTONIX) injection 40 mg (40 mg Intravenous Given 07/20/20 1901)  alum & mag hydroxide-simeth (MAALOX/MYLANTA) 200-200-20 MG/5ML suspension 30 mL (30 mLs Oral Given 07/20/20 1901)    And  lidocaine (XYLOCAINE) 2 % viscous mouth solution 15 mL (15 mLs  Oral Given 07/20/20 1901)  promethazine (PHENERGAN) injection 25 mg (25 mg Intravenous Given 07/20/20 1927)    ED Course  I have reviewed the triage vital signs and the nursing notes.  Pertinent labs & imaging results that were available during my care of the patient were reviewed by me and considered in my medical decision making (see chart for details).    MDM Rules/Calculators/A&P                          Patient presents to the ED with complaints of worsening abdominal pain, [facility]  [facility] and diagnosed with proctocolitis, unable to tolerate antibiotics or fluids. Patient nontoxic appearing, appears uncomfortable and is dry heaving, but in no apparent distress, vitals WNL. On exam patient tender to palpation across the upper abdomen, no lower abdominal tenderness, no peritoneal signs. Will evaluate with labs and CT given worsening pain that seems atypical for proctocolitis seen on . Analgesics, anti-emetics, and fluids administered.   Ddx includes: gastritis, PUD, Colitis, gastroenteritis, cholecystitis, pancreatitis, diverticulitis, appendicitis, bowel obstruction/perforation, PID, or ectopic pregnancy.   ER work-up reviewed:  CBC: No leukocytosis, normal hgb CMP: Mild hypokalemia, no other significant electrolyte derangements, normal renal and lover function Lipase: WNL UA: Some hematuria noted, no other signs of infection, no urinary symptoms  CT shows mucosal thickening of the pyloric region of the stomach suggesting gastritis. Small bowel and colon appear normal, no evidence of proctocolitis. This is fluid distention in the endometrial canal follow up pelvic US in 3-6 weeks recommended  On repeat abdominal exam patient remains without peritoneal signs. Patient tolerating PO in the emergency department. Will discharge home with supportive measures. I discussed results, treatment plan, need for PCP follow-up, and return precautions with the patient. Provided opportunity for questions, patient confirmed understanding and is in agreement with plan.   Final Clinical Impression(s) / ED Diagnoses Final diagnoses:  Acute gastritis without hemorrhage, unspecified gastritis type  Non-intractable vomiting with nausea, unspecified vomiting type    Rx / DC Orders ED Discharge Orders         Ordered    omeprazole (PRILOSEC) 40 MG capsule  2 times daily        07/20/20 2142    sucralfate (CARAFATE) 1 g tablet  3 times daily with meals & bedtime        07/20/20 2142     ondansetron (ZOFRAN ODT) 4 MG disintegrating tablet        07/20/20 2142           Dartha LodgeFord, Myranda Pavone N, [place]PA-C 07/25/20 1549    Sabas SousBero, Michael M, MD 07/25/20 1708

## 2020-07-20 NOTE — ED Notes (Signed)
Patient transported to CT 

## 2020-07-20 NOTE — ED Notes (Signed)
Pt given sprite and crackers, will reevaluate shortly to see if any n/v or pain

## 2020-07-20 NOTE — ED Triage Notes (Signed)
Pt here for eval of continued abdominal pain and n/v since being dx with colitis and proctocolitis on Sunday. Has been unable to tolerate PO abx that she was prescribed. Had follow up with PCP today who sent here here.

## 2020-07-20 NOTE — ED Notes (Signed)
Pt unable to tolerate PO meds

## 2020-07-20 NOTE — Discharge Instructions (Signed)
Please take medications as directed, I would like for you to take Zofran regularly scheduled every 4 hours for the next 2 days and then as needed for nausea, do not wait until you have started vomiting, the medication is not as effective this way.  Take omeprazole twice daily, use Carafate daily before meals and at bedtime.  For breakthrough symptoms you can also use Maalox.  Start with clear liquids and as you are able to keep them down in small frequent quantities you can advance your diet to bland foods.  Your CT scan showed gastritis that explains your symptoms and you will need to follow closely with GI for further evaluation.  If you develop worsening pain, blood in your stools or vomit or any other new or concerning symptoms return for reevaluation.

## 2020-07-29 ENCOUNTER — Inpatient Hospital Stay: Payer: BC Managed Care – PPO | Attending: Oncology

## 2020-07-29 ENCOUNTER — Other Ambulatory Visit: Payer: Self-pay

## 2020-07-29 DIAGNOSIS — D649 Anemia, unspecified: Secondary | ICD-10-CM | POA: Diagnosis not present

## 2020-07-29 LAB — CBC WITH DIFFERENTIAL/PLATELET
Abs Immature Granulocytes: 0.01 10*3/uL (ref 0.00–0.07)
Basophils Absolute: 0 10*3/uL (ref 0.0–0.1)
Basophils Relative: 0 %
Eosinophils Absolute: 0.2 10*3/uL (ref 0.0–0.5)
Eosinophils Relative: 3 %
HCT: 33 % — ABNORMAL LOW (ref 36.0–46.0)
Hemoglobin: 11.3 g/dL — ABNORMAL LOW (ref 12.0–15.0)
Immature Granulocytes: 0 %
Lymphocytes Relative: 28 %
Lymphs Abs: 1.4 10*3/uL (ref 0.7–4.0)
MCH: 33.1 pg (ref 26.0–34.0)
MCHC: 34.2 g/dL (ref 30.0–36.0)
MCV: 96.8 fL (ref 80.0–100.0)
Monocytes Absolute: 0.3 10*3/uL (ref 0.1–1.0)
Monocytes Relative: 6 %
Neutro Abs: 3.3 10*3/uL (ref 1.7–7.7)
Neutrophils Relative %: 63 %
Platelets: 383 10*3/uL (ref 150–400)
RBC: 3.41 MIL/uL — ABNORMAL LOW (ref 3.87–5.11)
RDW: 14.1 % (ref 11.5–15.5)
WBC: 5.2 10*3/uL (ref 4.0–10.5)
nRBC: 0 % (ref 0.0–0.2)

## 2020-07-29 LAB — IRON AND TIBC
Iron: 58 ug/dL (ref 28–170)
Saturation Ratios: 20 % (ref 10.4–31.8)
TIBC: 295 ug/dL (ref 250–450)
UIBC: 237 ug/dL

## 2020-07-29 LAB — FERRITIN: Ferritin: 25 ng/mL (ref 11–307)

## 2020-08-02 ENCOUNTER — Inpatient Hospital Stay (HOSPITAL_BASED_OUTPATIENT_CLINIC_OR_DEPARTMENT_OTHER): Payer: BC Managed Care – PPO | Admitting: Oncology

## 2020-08-02 ENCOUNTER — Other Ambulatory Visit: Payer: Self-pay

## 2020-08-02 DIAGNOSIS — D649 Anemia, unspecified: Secondary | ICD-10-CM

## 2020-08-02 DIAGNOSIS — R935 Abnormal findings on diagnostic imaging of other abdominal regions, including retroperitoneum: Secondary | ICD-10-CM

## 2020-08-08 NOTE — Progress Notes (Signed)
I connected with Shirley [name] on 08/08/20 at  2:30 PM EST by video enabled telemedicine visit and verified that I am speaking with the correct person using two identifiers.   I discussed the limitations, risks, security and privacy concerns of performing an evaluation and management service by telemedicine and the availability of in-person appointments. I also discussed with the patient that there may be a patient responsible charge related to this service. The patient expressed understanding and agreed to proceed.  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 Her most recent CBC from 07/08/2019 showed white count of 6.1, H&H of 9.9/31.1 and a platelet count of 402. Looking back at her prior CBCs patient had a normal hemoglobin of around 12 between 20 17-20 18. Since November 2018 her hemoglobin has been mostly fluctuating around 10 and then drifted down to 9 more recently in October 2020. Iron studies on 07/21/2019 showed a low iron saturation of 10% with a normal TIBC of 332 patient also underwent EGD and colonoscopy by Dr. Dalia Heading in November 2020 which did not show any evidence of bleeding and normal ferritin of 45.  Results of blood work from 07/28/2019 were as follows: CBC showed white count of 4.9, H&H of 8.8/28.1 with an MCV of 87 and a platelet count of 386. CMP showed hypokalemia with a potassium of 2.9. Folate was normal. Reticulocyte count was low at 0.9% indicating hypoproliferative anemia. Haptoglobin and TSH was normal. Serum free light chain ratio and myeloma panel was unremarkable. And he was normal, serum copper, B6 and B12 levels were normal.  Bone marrow biopsy in February 2021 showed mildly hypocellular marrow with  trilineage hematopoiesis and maturation.  No significant dysplasia.  FISH panel for MDS was negative and cytogenetics were normal.  Storage iron present and ring sideroblasts absent  Interval history patient reports doing well presently and is in mild fatigue she denies other complaints   Review of Systems  Constitutional: Positive for malaise/fatigue. Negative for chills, fever and weight loss.  HENT: Negative for congestion, ear discharge and nosebleeds.   Eyes: Negative for blurred vision.  Respiratory: Negative for cough, hemoptysis, sputum production, shortness of breath and wheezing.   Cardiovascular: Negative for chest pain, palpitations, orthopnea and claudication.  Gastrointestinal: Negative for abdominal pain, blood in stool, constipation, diarrhea, heartburn, melena, nausea and vomiting.  Genitourinary: Negative for dysuria, flank pain, frequency, hematuria and urgency.  Musculoskeletal: Negative for back pain, joint pain and myalgias.  Skin: Negative for rash.  Neurological: Negative for dizziness, tingling, focal weakness, seizures, weakness and headaches.  Endo/Heme/Allergies: Does not bruise/bleed easily.  Psychiatric/Behavioral: Negative for depression and suicidal ideas. The patient does not have insomnia.     Allergies  Allergen Reactions  . Lisinopril Anaphylaxis and Swelling    Feet, hands, and throat became swollen  . Aspirin Nausea Only    Makes stomach burn  . Ibuprofen Nausea Only    Makes stomach burn  . Other Nausea Only and Other (See Comments)    No spicy foods = Indigestion, also    Past Medical History:  Diagnosis Date  . Anemia   . Asthma   . Bacteremia   . Blood dyscrasia    has to apply a lot of pressure to stop bleeding  .  GERD (gastroesophageal reflux disease)   . Heart murmur    with pregnancy  . History of hiatal hernia   . Hypertension    4-5 years ago, no meds  . Kidney infection    patient states I might have a kidney infection  .  Migraine    migraines 1-2x/month  . Pyelonephritis   . Seizure ([place]) 05/09/2017   due to low potassium  . Wears dentures    partial upper and lower    Past Surgical History:  Procedure Laterality Date  . ABDOMINAL HERNIA REPAIR    . CESAREAN SECTION     x 4  . CESAREAN SECTION     x 4  . COLONOSCOPY WITH PROPOFOL N/A 07/24/2019   Procedure: COLONOSCOPY WITH PROPOFOL;  Surgeon: Virgel Manifold, MD;  Location: [facility];  Service: Endoscopy;  Laterality: N/A;  . ENDOMETRIAL ABLATION  07/31/2016  . ESOPHAGOGASTRODUODENOSCOPY (EGD) WITH PROPOFOL N/A 07/24/2019   Procedure: ESOPHAGOGASTRODUODENOSCOPY (EGD) WITH PROPOFOL;  Surgeon: Virgel Manifold, MD;  Location: [facility];  Service: Endoscopy;  Laterality: N/A;  . HYSTEROSCOPY  07/31/2016   Procedure: HYSTEROSCOPY WITH HYDROTHERMAL ABLATION;  Surgeon: Aletha Halim, MD;  Location: [facility];  Service: Gynecology;;    Social History   Socioeconomic History  . Marital status: Married    Spouse name: Harrell Gave  . Number of children: 4  . Years of education: Not on file  . Highest education level: Not on file  Occupational History  . Not on file  Tobacco Use  . Smoking status: Current Every Day Smoker    Packs/day: 0.25    Types: Cigarettes  . Smokeless tobacco: Never Used  . Tobacco comment: 1 black and mild/day  Vaping Use  . Vaping Use: Never used  Substance and Sexual Activity  . Alcohol use: Yes    Comment: occasionally  . Drug use: Yes    Types: Marijuana    Comment: 10/25/2019  . Sexual activity: Yes    Birth control/protection: None  Other Topics Concern  . Not on file  Social History Narrative  . Not on file   Social Determinants of Health   Financial Resource Strain:   . Difficulty of Paying Living Expenses: Not on file  Food Insecurity:   . Worried About Charity fundraiser in the Last Year: Not on file  . Ran Out of Food in the Last Year: Not on file  Transportation  Needs:   . Lack of Transportation (Medical): Not on file  . Lack of Transportation (Non-Medical): Not on file  Physical Activity:   . Days of Exercise per Week: Not on file  . Minutes of Exercise per Session: Not on file  Stress:   . Feeling of Stress : Not on file  Social Connections:   . Frequency of Communication with Friends and Family: Not on file  . Frequency of Social Gatherings with Friends and Family: Not on file  . Attends Religious Services: Not on file  . Active Member of Clubs or Organizations: Not on file  . Attends Archivist Meetings: Not on file  . Marital Status: Not on file  Intimate Partner Violence:   . Fear of Current or Ex-Partner: Not on file  . Emotionally Abused: Not on file  . Physically Abused: Not on file  . Sexually Abused: Not on file    Family History  Problem Relation Age of Onset  . Hypertension Mother   . Colon cancer Father 78  .  Stroke Brother      Current Outpatient Medications:  .  acetaminophen (TYLENOL) 500 MG tablet, Take 2 tablets (1,000 mg total) by mouth every 8 (eight) hours as needed for mild pain, moderate pain, fever or headache., Disp: 50 tablet, Rfl: 0 .  albuterol (PROVENTIL HFA;VENTOLIN HFA) 108 (90 Base) MCG/ACT inhaler, Inhale 2 puffs into the lungs every 6 (six) hours as needed for wheezing or shortness of breath., Disp: , Rfl:  .  amLODipine (NORVASC) 2.5 MG tablet, Take 2.5 mg by mouth daily., Disp: , Rfl: 1 .  Ascorbic Acid (VITAMIN C PO), Take by mouth daily., Disp: , Rfl:  .  gabapentin (NEURONTIN) 100 MG capsule, Take 1 capsule by mouth 1 day or 1 dose., Disp: , Rfl:  .  magnesium oxide (MAG-OX) 400 MG tablet, Take 400 mg by mouth 2 (two) times daily., Disp: , Rfl:  .  Multiple Vitamins-Minerals (WOMENS DAILY FORMULA PO), Take 1 tablet by mouth daily., Disp: , Rfl:  .  omeprazole (PRILOSEC) 40 MG capsule, Take 1 capsule (40 mg total) by mouth 2 (two) times daily., Disp: 60 capsule, Rfl: 0 .  ondansetron  (ZOFRAN ODT) 4 MG disintegrating tablet, 67m ODT q4 hours prn nausea/vomit, Disp: 20 tablet, Rfl: 0 .  polyethylene glycol (MIRALAX / GLYCOLAX) packet, Take 17 g by mouth daily as needed for mild constipation. , Disp: , Rfl:  .  Potassium Chloride ER 20 MEQ TBCR, Take 1 tablet by mouth daily., Disp: , Rfl:  .  sucralfate (CARAFATE) 1 g tablet, Take 1 tablet (1 g total) by mouth 4 (four) times daily -  with meals and at bedtime., Disp: 120 tablet, Rfl: 0 .  nitrofurantoin, macrocrystal-monohydrate, (MACROBID) 100 MG capsule, Take 100 mg by mouth 2 (two) times daily. (Patient not taking: Reported on 08/02/2020), Disp: , Rfl:   Current Facility-Administered Medications:  .  0.9 %  sodium chloride infusion, 500 mL, Intravenous, Once, Danis, HKirke Corin MD  DG Chest 2 View  Result Date: 07/17/2020 CLINICAL DATA:  Central chest pain radiating to LEFT side, shortness of breath, LEFT arm numbness for 1 day, history asthma, hypertension, smoker, prior seizure EXAM: CHEST - 2 VIEW COMPARISON:  09/10/2018 FINDINGS: Normal heart size, mediastinal contours, and pulmonary vascularity. Lungs clear. No pleural effusion or pneumothorax. Bones unremarkable. IMPRESSION: No acute abnormalities. Electronically Signed   By: MLavonia DanaM.D.   On: 07/17/2020 17:07   CT ABDOMEN PELVIS W CONTRAST  Result Date: 07/20/2020 CLINICAL DATA:  Vomiting. Acute abdominal pain. Worsening abdominal pain. Patient seen 3 days prior with similar findings EXAM: CT ABDOMEN AND PELVIS WITH CONTRAST TECHNIQUE: Multidetector CT imaging of the abdomen and pelvis was performed using the standard protocol following bolus administration of intravenous contrast. CONTRAST:  100 mL Omnipaque 300 COMPARISON:  CT 07/18/2019 FINDINGS: Lower chest: Lung bases are clear. Hepatobiliary: No focal hepatic lesion. No biliary duct dilatation. Common bile duct is normal. Pancreas: Pancreas is normal. No ductal dilatation. No pancreatic inflammation. Spleen:  Normal spleen Adrenals/urinary tract: Adrenal glands and kidneys are normal. The ureters and bladder normal. Stomach/Bowel: proximal stomach is normal. There is circumferential thickening of the gastric wall through the pyloric region seen on coronal image 18/series 6. There is some thickening on comparison CT 3 days prior. This submucosal thickening extends over approximately 4 cm again through the pyloric region. The duodenum appears normal. The small bowel appears normal. There is limited intra-abdominal fat which does limit evaluation. Appendix is normal. The ascending, transverse  and descending colon appear normal. Rectosigmoid colon normal. There is small amount free fluid the pelvis. Vascular/Lymphatic: Abdominal aorta is normal caliber. No periportal or retroperitoneal adenopathy. No pelvic adenopathy. Reproductive: The endometrial canal is distended by intermediate density fluid. The canal is distended to 2.3 cm. There is hyperemia of the endometrial cavity. Findings similar to 3 days prior with endometrial cavity measured 2.5 cm. RIGHT ovary appears normal measuring 17 mm image 59/3. LEFT ovary is difficult to define. Other: Small free fluid the pelvis.  No intraperitoneal free air. Musculoskeletal: No aggressive osseous lesion. IMPRESSION: 1. Marked circumferential submucosal thickening through the pyloric region of the stomach suggests gastritis. Findings similar to several days prior. This potentially could result in patient vomiting. 2. Small bowel colon appear normal.  Appendix normal. 3. Fluid distension of the endometrial canal. Cannot exclude a cervical obstruction. Recommend follow-up pelvic ultrasound in 3 to 6 weeks to demonstrate resolution. Electronically Signed   By: Suzy Bouchard M.D.   On: 07/20/2020 18:37   CT Abdomen Pelvis W Contrast  Result Date: 07/17/2020 CLINICAL DATA:  Abdominal pain EXAM: CT ABDOMEN AND PELVIS WITH CONTRAST TECHNIQUE: Multidetector CT imaging of the abdomen  and pelvis was performed using the standard protocol following bolus administration of intravenous contrast. CONTRAST:  144m OMNIPAQUE IOHEXOL 300 MG/ML  SOLN COMPARISON:  May 07, 2020 FINDINGS: Lower chest: The visualized heart size within normal limits. No pericardial fluid/thickening. No hiatal hernia. The visualized portions of the lungs are clear. Hepatobiliary: The liver is normal in density without focal abnormality.The main portal vein is patent. No evidence of calcified gallstones, gallbladder wall thickening or biliary dilatation. Pancreas: Unremarkable. No pancreatic ductal dilatation or surrounding inflammatory changes. Spleen: Normal in size without focal abnormality. Adrenals/Urinary Tract: Both adrenal glands appear normal. The kidneys and collecting system appear normal without evidence of urinary tract calculus or hydronephrosis. Bladder is unremarkable. Stomach/Bowel: The stomach and small bowel are grossly unremarkable. There is question of mild wall thickening the sigmoid colon rectum. Adjacent to the rectum on series 2, image 71 there is a tiny pocket of fluid which represent within a nondistended loop bowel. Vascular/Lymphatic: There are no enlarged mesenteric, retroperitoneal, or pelvic lymph nodes. No significant vascular findings are present. Reproductive: Fluid is seen within the endometrial canal distending the superior uterus. Other: A small amount of free fluid seen within the deep pelvis. Musculoskeletal: No acute or significant osseous findings. IMPRESSION: Findings which may be suggestive mild sigmoid colitis and proctocolitis. There is also a tiny of fluid within the deep pelvis which may be in a partially distended loop bowel. Fluid within a distended superior endometrial canal. Small amount of free fluid within the deep pelvis. Electronically Signed   By: BPrudencio PairM.D.   On: 07/17/2020 19:16    No images are attached to the encounter.   CMP Latest Ref Rng & Units  07/20/2020  Glucose 70 - 99 mg/dL 97  BUN 6 - 20 mg/dL 9  Creatinine 0.44 - 1.00 mg/dL 0.62  Sodium 135 - 145 mmol/L 138  Potassium 3.5 - 5.1 mmol/L 3.2(L)  Chloride 98 - 111 mmol/L 104  CO2 22 - 32 mmol/L 22  Calcium 8.9 - 10.3 mg/dL 10.0  Total Protein 6.5 - 8.1 g/dL 7.8  Total Bilirubin 0.3 - 1.2 mg/dL 0.6  Alkaline Phos 38 - 126 U/L 44  AST 15 - 41 U/L 15  ALT 0 - 44 U/L 12   CBC Latest Ref Rng & Units 07/29/2020  WBC  4.0 - 10.5 K/uL 5.2  Hemoglobin 12.0 - 15.0 g/dL 11.3(L)  Hematocrit 36 - 46 % 33.0(L)  Platelets 150 - 400 K/uL 383     Observation/objective: Appears in no acute distress over video visit today.  Breathing is nonlabored  Assessment and plan: Patient is a 44 year old female with normocytic anemia of unclear etiology and this is a routine follow-up visit  Patient's hemoglobin was 8.8 in November of last year and then gradually improved to 12.7.  Today it slightly lower at 11.3.  Iron studies are presently normal.  She has had an extensive work-up including a bone marrow biopsy which did not reveal any etiology for anemia.  At this time I am inclined to monitor her anemia conservatively and see her back in 4 months with CBC with differential and and iron studies B12 and folate.  Patient had recently been to the ER with symptoms of abdominal pain and had a CT abdomen pelvis with contrast which showed findings of gastritis but also showed fluid distention of the endometrial canal and recommended follow-up was a pelvic ultrasound in 3 to 6 weeks.  I will plan to get a pelvic ultrasound end of December.   Follow-up instructions: As above  I discussed the assessment and treatment plan with the patient. The patient was provided an opportunity to ask questions and all were answered. The patient agreed with the plan and demonstrated an understanding of the instructions.   The patient was advised to call back or seek an in-person evaluation if the symptoms worsen or if the  condition fails to improve as anticipated.   Visit Diagnosis: 1. Normocytic anemia   2. Abnormal CT of the abdomen     Dr. Randa Evens, MD, MPH [facility] Tel- [phone] 08/08/2020 1:28 PM

## 2020-09-07 ENCOUNTER — Ambulatory Visit: Admission: RE | Admit: 2020-09-07 | Payer: BC Managed Care – PPO | Source: Ambulatory Visit

## 2020-10-06 DIAGNOSIS — E876 Hypokalemia: Secondary | ICD-10-CM | POA: Diagnosis not present

## 2020-10-06 DIAGNOSIS — K581 Irritable bowel syndrome with constipation: Secondary | ICD-10-CM | POA: Diagnosis not present

## 2020-10-06 DIAGNOSIS — D509 Iron deficiency anemia, unspecified: Secondary | ICD-10-CM | POA: Diagnosis not present

## 2020-10-06 DIAGNOSIS — K253 Acute gastric ulcer without hemorrhage or perforation: Secondary | ICD-10-CM | POA: Diagnosis not present

## 2020-10-08 ENCOUNTER — Other Ambulatory Visit: Payer: Self-pay

## 2020-10-08 ENCOUNTER — Encounter: Payer: Self-pay | Admitting: Radiology

## 2020-10-08 ENCOUNTER — Emergency Department: Payer: BC Managed Care – PPO

## 2020-10-08 ENCOUNTER — Emergency Department
Admission: EM | Admit: 2020-10-08 | Discharge: 2020-10-08 | Disposition: A | Discharge status: Home / Self Care | Payer: BC Managed Care – PPO | Attending: Emergency Medicine | Admitting: Emergency Medicine

## 2020-10-08 DIAGNOSIS — I10 Essential (primary) hypertension: Secondary | ICD-10-CM | POA: Diagnosis not present

## 2020-10-08 DIAGNOSIS — Z79899 Other long term (current) drug therapy: Secondary | ICD-10-CM | POA: Diagnosis not present

## 2020-10-08 DIAGNOSIS — F17210 Nicotine dependence, cigarettes, uncomplicated: Secondary | ICD-10-CM | POA: Insufficient documentation

## 2020-10-08 DIAGNOSIS — R102 Pelvic and perineal pain: Secondary | ICD-10-CM | POA: Insufficient documentation

## 2020-10-08 DIAGNOSIS — N858 Other specified noninflammatory disorders of uterus: Secondary | ICD-10-CM | POA: Insufficient documentation

## 2020-10-08 DIAGNOSIS — N859 Noninflammatory disorder of uterus, unspecified: Secondary | ICD-10-CM

## 2020-10-08 DIAGNOSIS — R1110 Vomiting, unspecified: Secondary | ICD-10-CM | POA: Diagnosis not present

## 2020-10-08 DIAGNOSIS — R1030 Lower abdominal pain, unspecified: Secondary | ICD-10-CM | POA: Diagnosis not present

## 2020-10-08 DIAGNOSIS — K5900 Constipation, unspecified: Secondary | ICD-10-CM | POA: Insufficient documentation

## 2020-10-08 DIAGNOSIS — J45909 Unspecified asthma, uncomplicated: Secondary | ICD-10-CM | POA: Insufficient documentation

## 2020-10-08 DIAGNOSIS — R109 Unspecified abdominal pain: Secondary | ICD-10-CM | POA: Diagnosis not present

## 2020-10-08 LAB — COMPREHENSIVE METABOLIC PANEL
ALT: 11 U/L (ref 0–44)
AST: 15 U/L (ref 15–41)
Albumin: 4 g/dL (ref 3.5–5.0)
Alkaline Phosphatase: 41 U/L (ref 38–126)
Anion gap: 10 (ref 5–15)
BUN: 9 mg/dL (ref 6–20)
CO2: 25 mmol/L (ref 22–32)
Calcium: 9.9 mg/dL (ref 8.9–10.3)
Chloride: 109 mmol/L (ref 98–111)
Creatinine, Ser: 0.56 mg/dL (ref 0.44–1.00)
GFR, Estimated: >60 mL/min (ref >60)
Glucose, Bld: 87 mg/dL (ref 70–99)
Potassium: 3.5 mmol/L (ref 3.5–5.1)
Sodium: 144 mmol/L (ref 135–145)
Total Bilirubin: 0.2 mg/dL — ABNORMAL LOW (ref 0.3–1.2)
Total Protein: 7.1 g/dL (ref 6.5–8.1)

## 2020-10-08 LAB — URINALYSIS, COMPLETE (UACMP) WITH MICROSCOPIC
Bilirubin Urine: NEGATIVE
Glucose, UA: NEGATIVE mg/dL
Ketones, ur: NEGATIVE mg/dL
Leukocytes,Ua: NEGATIVE
Nitrite: NEGATIVE
Protein, ur: NEGATIVE mg/dL
Specific Gravity, Urine: 1.002 — ABNORMAL LOW (ref 1.005–1.030)
WBC, UA: NONE SEEN WBC/hpf (ref 0–5)
pH: 8 (ref 5.0–8.0)

## 2020-10-08 LAB — CBC
HCT: 33.5 % — ABNORMAL LOW (ref 36.0–46.0)
Hemoglobin: 11.1 g/dL — ABNORMAL LOW (ref 12.0–15.0)
MCH: 32.6 pg (ref 26.0–34.0)
MCHC: 33.1 g/dL (ref 30.0–36.0)
MCV: 98.2 fL (ref 80.0–100.0)
Platelets: 302 10*3/uL (ref 150–400)
RBC: 3.41 MIL/uL — ABNORMAL LOW (ref 3.87–5.11)
RDW: 13.7 % (ref 11.5–15.5)
WBC: 5.2 10*3/uL (ref 4.0–10.5)
nRBC: 0 % (ref 0.0–0.2)

## 2020-10-08 LAB — PREGNANCY, URINE: Preg Test, Ur: NEGATIVE

## 2020-10-08 LAB — LIPASE, BLOOD: Lipase: 27 U/L (ref 11–51)

## 2020-10-08 IMAGING — CT CT ABD-PELV W/ CM
2 of 5 series · 15 of 46 positions shown, 17 images · IV contrast (APPLIED)
Comparison: [DATE].  Pelvic ultrasound dated [DATE].

CLINICAL DATA: Abdominal distension and cramping lower abdominal
pain for several days. Last bowel movement 2 days ago. Nausea and
vomiting over the past 3 days. Previous endometrial ablation on
[DATE].

EXAM:
CT ABDOMEN AND PELVIS WITH CONTRAST
TECHNIQUE: Multidetector CT imaging of the abdomen and pelvis was performed
using the standard protocol following bolus administration of
intravenous contrast.
CONTRAST:  100mL OMNIPAQUE IOHEXOL 300 MG/ML  SOLN

[Series 2: routine abd/pel with · axial · 0.64mm/px · z∈[-470,-80]mm · 12 of 88 slices shown, 14 images]
[im 5/88  soft-tissue]
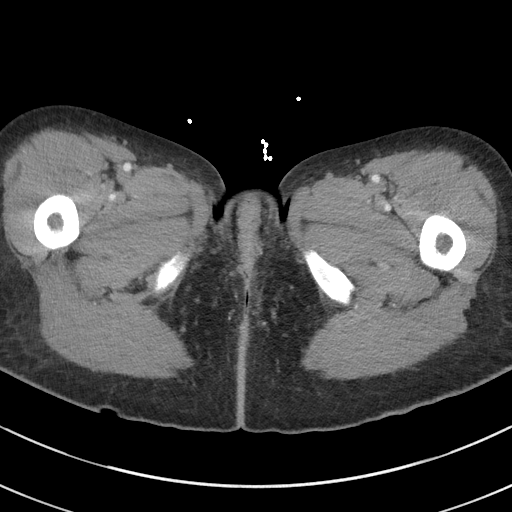
[im 5/88  bone]
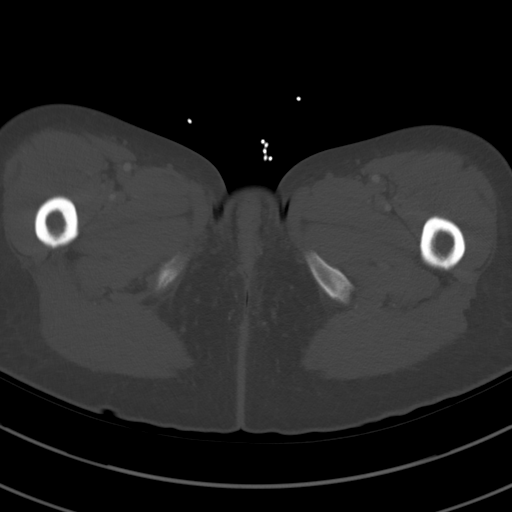
[im 14/88  soft-tissue]
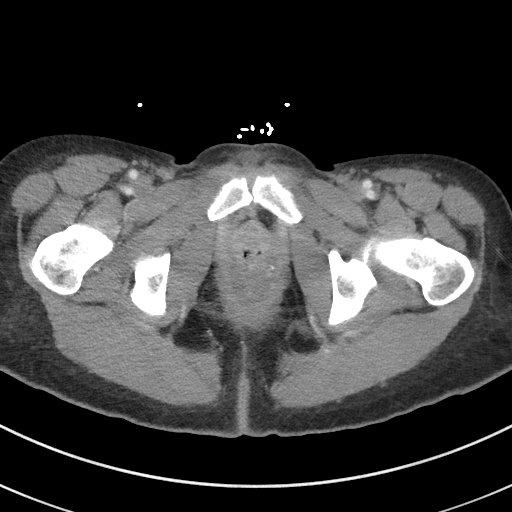
[im 19/88  soft-tissue]
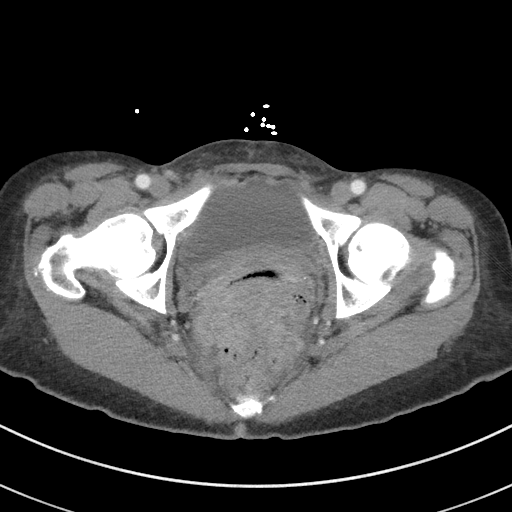
[im 28/88  soft-tissue]
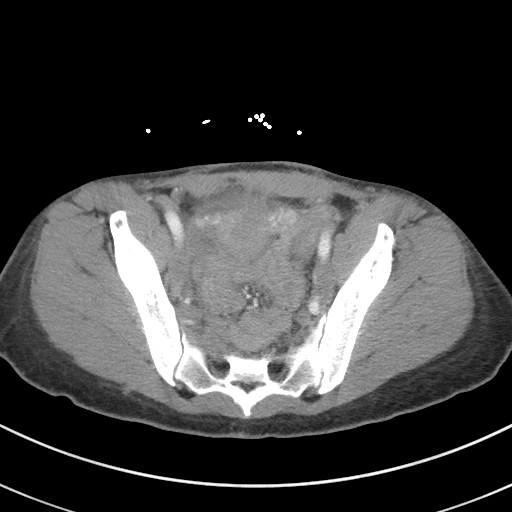
[im 33/88  soft-tissue]
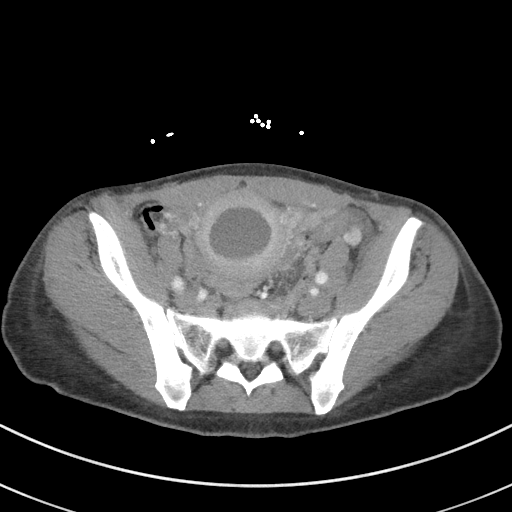
[im 42/88  soft-tissue]
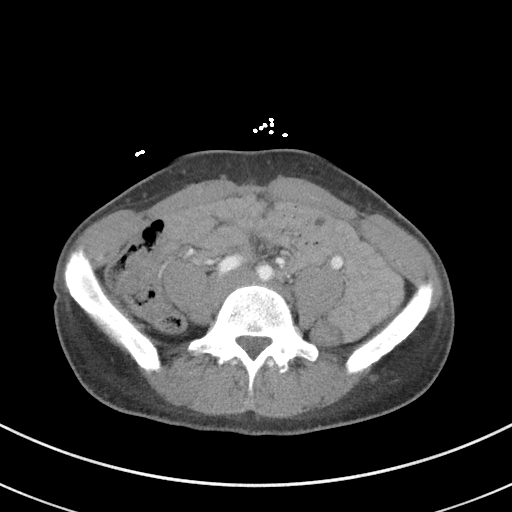
[im 46/88  soft-tissue]
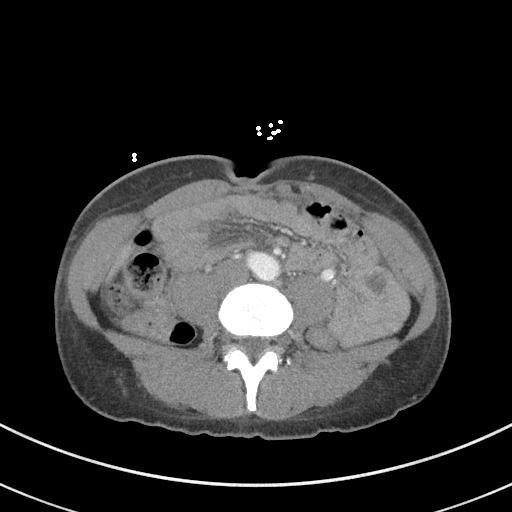
[im 55/88  soft-tissue]
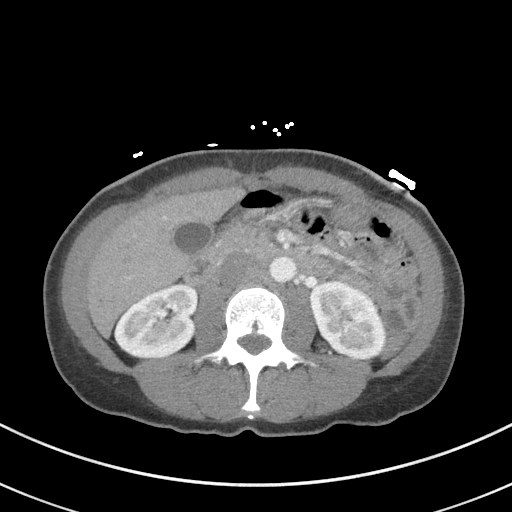
[im 60/88  soft-tissue]
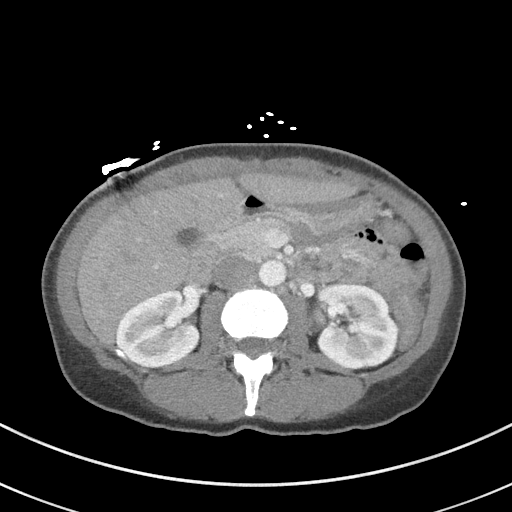
[im 60/88  bone]
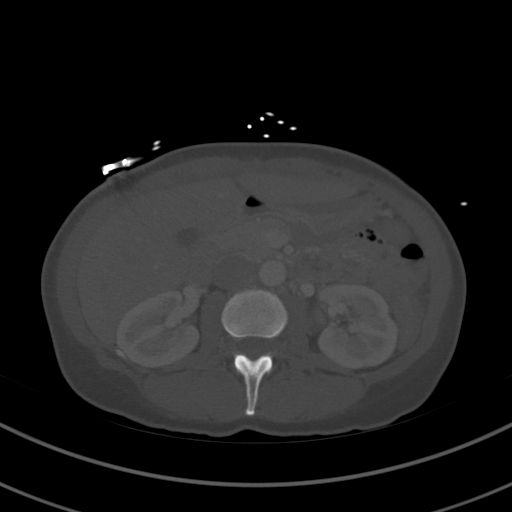
[im 69/88  soft-tissue]
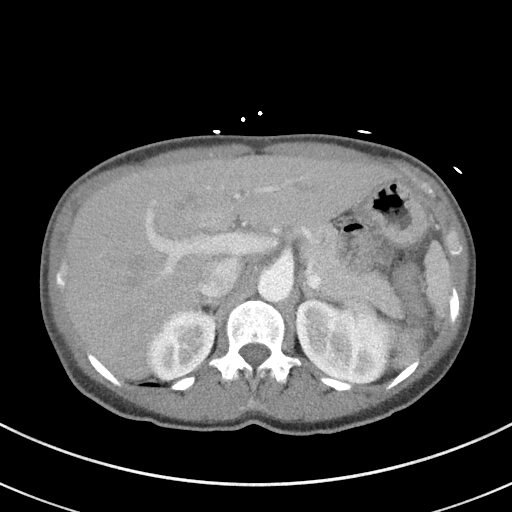
[im 74/88  soft-tissue]
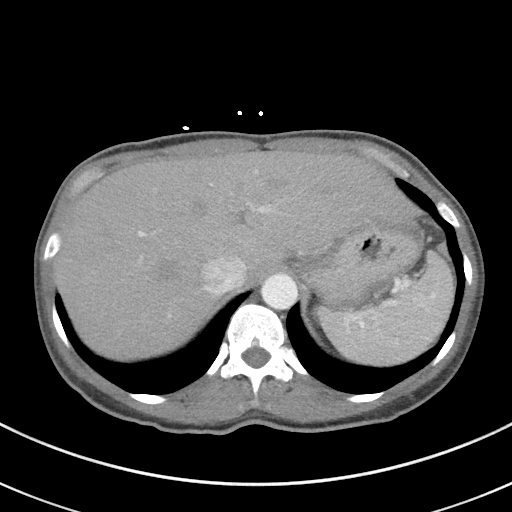
[im 83/88  soft-tissue]
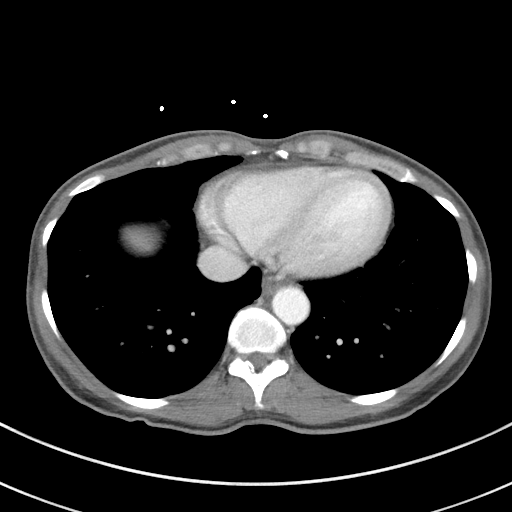

[Series 5: coronal st · coronal · 0.63mm/px · 3 of 68 slices shown]
[im 23/68  soft-tissue]
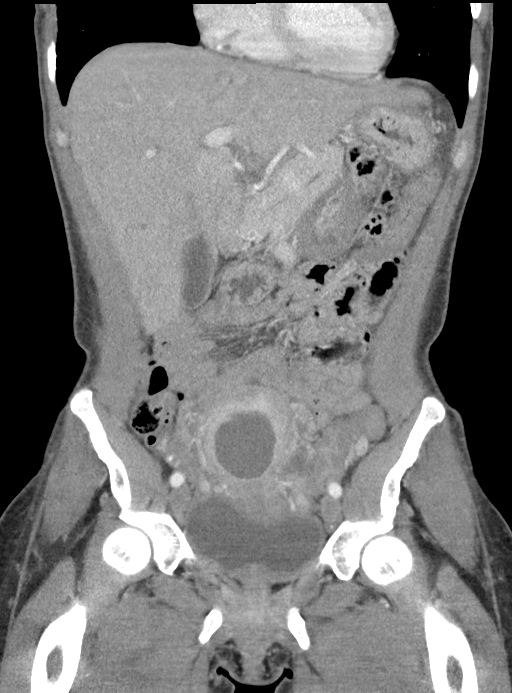
[im 30/68  soft-tissue]
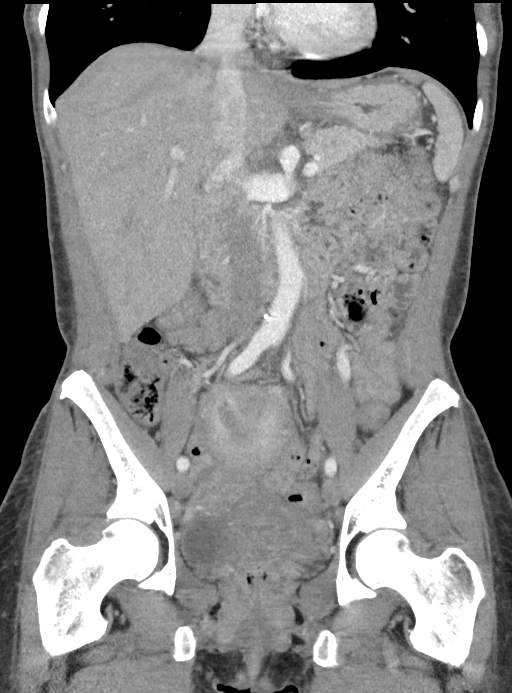
[im 38/68  soft-tissue]
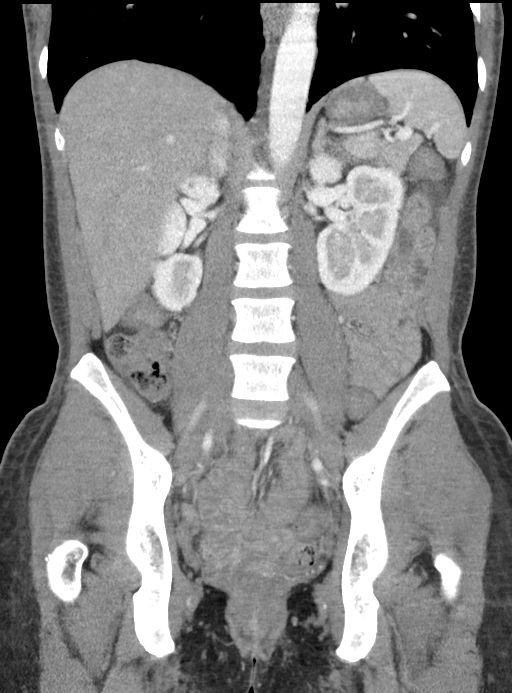

[15 of 46 positions shown; findings below may reference images not displayed]

FINDINGS: Lower chest: Unremarkable.

Hepatobiliary: No focal liver abnormality is seen. No gallstones,
gallbladder wall thickening, or biliary dilatation.

Pancreas: Unremarkable. No pancreatic ductal dilatation or
surrounding inflammatory changes.

Spleen: Normal in size without focal abnormality.

Adrenals/Urinary Tract: Adrenal glands are unremarkable. Kidneys are
normal, without renal calculi, focal lesion, or hydronephrosis.
Bladder is unremarkable.

Stomach/Bowel: Stomach is within normal limits. Appendix appears
normal. No evidence of bowel wall thickening, distention, or
inflammatory changes.

Vascular/Lymphatic: No significant vascular findings are present. No
enlarged abdominal or pelvic lymph nodes.

Reproductive: There has been an increase in size of an oval
collection of fluid distending the endometrium in the mid and upper
uterus. On sagittal image number 52, this currently measures 4.2 x
3.3 cm and 3.9 cm in width on axial image number 57 series 2. This
previously measured 2.5 x 3.5 x 3.2 cm in corresponding dimensions
on [DATE]. This was not present on [DATE]. There are some
mildly thickened peripheral enhancing membranes within this
superiorly.

Other: No abdominal wall hernia or abnormality. No abdominopelvic
ascites.

Musculoskeletal: Unremarkable bones.
IMPRESSION: 1. Interval increase in size of an oval collection of fluid
distending the endometrium in the mid and upper uterus, currently
measuring 4.2 x 3.3 x 3.9 cm. This previously measured 2.5 x 3.5 x
3.2 cm. This could represent progressive hydrometrocolpos associated
an endometrial or cervical stricture from the patient's previous
endometrial ablation. The possibility of infected fluid needs to be
excluded clinically. There is no CT visible fetal pole to indicate a
pregnancy. This could be better excluded with a pregnancy test. A
cystic neoplasm is unlikely but not excluded.
2. Otherwise, unremarkable examination.

## 2020-10-08 MED ORDER — ONDANSETRON HCL 4 MG PO TABS: 4.0000 mg | ORAL_TABLET | Freq: Three times a day (TID) | ORAL | 0 refills | Status: DC | PRN

## 2020-10-08 MED ORDER — LACTATED RINGERS IV BOLUS
1000.0000 mL | Freq: Once | INTRAVENOUS | Status: AC
  Administered 2020-10-08: 1000 mL via INTRAVENOUS

## 2020-10-08 MED ORDER — OXYCODONE HCL 5 MG PO TABS
5.0000 mg | ORAL_TABLET | Freq: Once | ORAL | Status: AC
  Administered 2020-10-08: 5 mg via ORAL
  Filled 2020-10-08: qty 1

## 2020-10-08 MED ORDER — ACETAMINOPHEN 500 MG PO TABS
1000.0000 mg | ORAL_TABLET | Freq: Once | ORAL | Status: AC
  Administered 2020-10-08: 1000 mg via ORAL
  Filled 2020-10-08: qty 2

## 2020-10-08 MED ORDER — ONDANSETRON HCL 4 MG/2ML IJ SOLN
4.0000 mg | Freq: Once | INTRAMUSCULAR | Status: AC
  Administered 2020-10-08: 4 mg via INTRAVENOUS
  Filled 2020-10-08: qty 2

## 2020-10-08 MED ORDER — IOHEXOL 300 MG/ML  SOLN
100.0000 mL | Freq: Once | INTRAMUSCULAR | Status: AC | PRN
  Administered 2020-10-08: 100 mL via INTRAVENOUS

## 2020-10-08 MED ORDER — OXYCODONE-ACETAMINOPHEN 5-325 MG PO TABS: 1.0000 | ORAL_TABLET | Freq: Three times a day (TID) | ORAL | 0 refills | Status: AC | PRN

## 2020-10-08 MED ORDER — KETOROLAC TROMETHAMINE 30 MG/ML IJ SOLN
30.0000 mg | Freq: Once | INTRAMUSCULAR | Status: AC
  Administered 2020-10-08: 30 mg via INTRAVENOUS
  Filled 2020-10-08: qty 1

## 2020-10-08 MED ORDER — HYDROMORPHONE HCL 1 MG/ML IJ SOLN
0.5000 mg | Freq: Once | INTRAMUSCULAR | Status: AC
  Administered 2020-10-08: 0.5 mg via INTRAVENOUS
  Filled 2020-10-08: qty 1

## 2020-10-08 NOTE — ED Notes (Signed)
Provided remote for TV.

## 2020-10-08 NOTE — Discharge Instructions (Addendum)
Your CT report today showed: 1. Interval increase in size of an oval collection of fluid distending the endometrium in the mid and upper uterus, currently measuring 4.2 x 3.3 x 3.9 cm. This previously measured 2.5 x 3.5 x 3.2 cm. 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 The possibility of infected fluid needs to be excluded clinically. There is no CT visible fetal pole to indicate a pregnancy. This could be better excluded with a pregnancy test. A cystic neoplasm is unlikely but not excluded. 2. Otherwise, unremarkable examination.

## 2020-10-08 NOTE — ED Notes (Signed)
EDP at bedside. Pt denies pain, burning with urination and denies vaginal discharge. Pt states has lower cramping abdominal pain for several days. LBM 2 days ago. Has been taking Miralax one packet per day. Has had N/V for past 3 days with 5 episodes emesis over last 3d. Pt states allergic to aspirin and lisinopril.

## 2020-10-08 NOTE — ED Provider Notes (Addendum)
[facility] Emergency Department Provider Note  ____________________________________________   Event Date/Time   First MD Initiated Contact with Patient 10/08/20 [phone]     (approximate)  I have reviewed the triage vital signs and the nursing notes.   HISTORY  Chief Complaint Abdominal Pain   HPI Shirley [name] is a 45 y.o. female with a past medical history of asthma, anemia, GERD, HTN, migraines, seizures secondary to low potassium in the past and amenorrhea secondary to endometrial ablation in 2017 who presents for assessment approximately 3 days of worsening lower abdominal crampy pain associate with nonbloody nonbilious emesis and constipation.  Patient states it has been at least several days since her last bowel movement but is not exactly sure when.  She denies any headache, earache, sore throat, cough, shortness of breath, chest pain, back pain, urinary symptoms, abnormal vaginal bleeding or discharge, rash or extremity pain.  No recent falls or injuries.  She has been taking Tylenol and 1 dose of MiraLAX per day without relief of her constipation or pain.  No prior similar episodes.  No clear alleviating or aggravating factors.  She denies regular EtOH use.  Endorses tobacco abuse.  Denies illicit drug use with exception of some intermittent THC use.         Past Medical History:  Diagnosis Date  . Anemia   . Asthma   . Bacteremia   . Blood dyscrasia    has to apply a lot of pressure to stop bleeding  . GERD (gastroesophageal reflux disease)   . Heart murmur    with pregnancy  . History of hiatal hernia   . Hypertension    4-5 years ago, no meds  . Kidney infection    patient states I might have a kidney infection  . Migraine    migraines 1-2x/month  . Pyelonephritis   . Seizure (HCC) 05/09/2017   due to low potassium  . Wears dentures    partial upper and lower    Patient Active Problem List   Diagnosis Date Noted  . Iron  deficiency anemia 08/10/2019  . Acute peptic ulcer of stomach   . Dysphagia, idiopathic   . Nausea   . Abnormal CT scan, colon   . Pyelonephritis 08/03/2017  . Bacteremia 08/03/2017  . Hypokalemia 08/03/2017  . Tobacco use 08/03/2017  . Acute postoperative pain 07/31/2016  . Menometrorrhagia 04/09/2016  . Anemia 04/09/2016  . Abnormal uterine bleeding (AUB) 04/09/2016  . Essential hypertension 11/17/2009    Past Surgical History:  Procedure Laterality Date  . ABDOMINAL HERNIA REPAIR    . CESAREAN SECTION     x 4  . CESAREAN SECTION     x 4  . COLONOSCOPY WITH PROPOFOL N/A 07/24/2019   Procedure: COLONOSCOPY WITH PROPOFOL;  Surgeon: Pasty Spillersahiliani, Varnita B, MD;  [facility];  Service: Endoscopy;  Laterality: N/A;  . ENDOMETRIAL ABLATION  07/31/2016  . ESOPHAGOGASTRODUODENOSCOPY (EGD) WITH PROPOFOL N/A 07/24/2019   Procedure: ESOPHAGOGASTRODUODENOSCOPY (EGD) WITH PROPOFOL;  Surgeon: Pasty Spillersahiliani, Varnita B, MD;  [facility];  Service: Endoscopy;  Laterality: N/A;  . HYSTEROSCOPY  07/31/2016   Procedure: HYSTEROSCOPY WITH HYDROTHERMAL ABLATION;  Surgeon: Sharon Bingharlie Pickens, MD;  Location: [facility];  Service: Gynecology;;    Prior to Admission medications   Medication Sig Start Date End Date Taking? Authorizing Provider  ondansetron (ZOFRAN) 4 MG tablet Take 1 tablet (4 mg total) by mouth every 8 (eight) hours as needed for up to 10 doses  for nausea or vomiting. 10/08/20  Yes Gilles Chiquito, MD  oxyCODONE-acetaminophen (PERCOCET) 5-325 MG tablet Take 1 tablet by mouth every 8 (eight) hours as needed for up to 5 days for severe pain. 10/08/20 10/13/20 Yes Gilles Chiquito, MD  albuterol (PROVENTIL HFA;VENTOLIN HFA) 108 (90 Base) MCG/ACT inhaler Inhale 2 puffs into the lungs every 6 (six) hours as needed for wheezing or shortness of breath.    [provider]  amLODipine (NORVASC) 2.5 MG tablet Take 2.5 mg by mouth daily. 01/08/18   [provider]  Ascorbic Acid (VITAMIN C PO) Take by mouth daily.    [provider]  gabapentin (NEURONTIN) 100 MG capsule Take 1 capsule by mouth 1 day or 1 dose. 11/12/19   [provider]  magnesium oxide (MAG-OX) 400 MG tablet Take 400 mg by mouth 2 (two) times daily.    [provider]  Multiple Vitamins-Minerals (WOMENS DAILY FORMULA PO) Take 1 tablet by mouth daily.    [provider]  omeprazole (PRILOSEC) 40 MG capsule Take 1 capsule (40 mg total) by mouth 2 (two) times daily. 07/20/20 08/19/20  Dartha Lodge, PA-C  ondansetron (ZOFRAN ODT) 4 MG disintegrating tablet 4mg  ODT q4 hours prn nausea/vomit 07/20/20   13/10/21, PA-C  polyethylene glycol [facility] / GLYCOLAX) packet Take 17 g by mouth daily as needed for mild constipation.     [provider]  Potassium Chloride ER 20 MEQ TBCR Take 1 tablet by mouth daily. 11/12/19   [provider]  sucralfate (CARAFATE) 1 g tablet Take 1 tablet (1 g total) by mouth 4 (four) times daily -  with meals and at bedtime. 07/20/20 08/19/20  14/10/21, PA-C    Allergies Lisinopril, Aspirin, Ibuprofen, and Other  Family History  Problem Relation Age of Onset  . Hypertension Mother   . Colon cancer Father 37  . Stroke Brother     Social History Social History   Tobacco Use  . Smoking status: Current Every Day Smoker    Packs/day: 0.25    Types: Cigarettes  . Smokeless tobacco: Never Used  . Tobacco comment: 1 black and mild/day  Vaping Use  . Vaping Use: Never used  Substance Use Topics  . Alcohol use: Yes    Comment: occasionally  . Drug use: Yes    Types: Marijuana    Comment: 10/25/2019    Review of Systems  Review of Systems  Constitutional: Negative for chills and fever.  HENT: Negative for sore throat.   Eyes: Negative for pain.  Respiratory: Negative for cough and stridor.   Cardiovascular: Negative for chest pain.  Gastrointestinal: Positive for abdominal pain,  constipation, nausea and vomiting.  Genitourinary: Negative for dysuria.  Musculoskeletal: Negative for myalgias.  Skin: Negative for rash.  Neurological: Negative for seizures, loss of consciousness and headaches.  Psychiatric/Behavioral: Negative for suicidal ideas.  All other systems reviewed and are negative.     ____________________________________________   PHYSICAL EXAM:  VITAL SIGNS: ED Triage Vitals [10/08/20 0834]  Enc Vitals Group     BP 116/78     Pulse Rate (!) 114     Resp 16     Temp 98.8 F (37.1 C)     Temp Source Oral     SpO2 100 %     Weight      Height      Head Circumference      Peak Flow      Pain  Score 9     Pain Loc      Pain Edu?      Excl. in GC?    Vitals:   10/08/20 1000 10/08/20 1213  BP: 118/83 116/82  Pulse: 69 69  Resp: 16 14  Temp:    SpO2: 100% 100%   Physical Exam Vitals and nursing note reviewed.  Constitutional:      General: She is not in acute distress.    Appearance: She is well-developed and well-nourished.  HENT:     Head: Normocephalic and atraumatic.  Eyes:     Conjunctiva/sclera: Conjunctivae normal.  Cardiovascular:     Rate and Rhythm: Normal rate and regular rhythm.     Heart sounds: No murmur heard.   Pulmonary:     Effort: Pulmonary effort is normal. No respiratory distress.     Breath sounds: Normal breath sounds.  Abdominal:     Palpations: Abdomen is soft.     Tenderness: There is abdominal tenderness in the right lower quadrant, suprapubic area and left lower quadrant. There is no right CVA tenderness or left CVA tenderness.  Musculoskeletal:        General: No edema.     Cervical back: Neck supple.  Skin:    General: Skin is warm and dry.     Capillary Refill: Capillary refill takes less than 2 seconds.  Neurological:     General: No focal deficit present.     Mental Status: She is alert.  Psychiatric:        Mood and Affect: Mood and affect and mood normal.       ____________________________________________   LABS (all labs ordered are listed, but only abnormal results are displayed)  Labs Reviewed  COMPREHENSIVE METABOLIC PANEL - Abnormal; Notable for the following components:      Result Value   Total Bilirubin 0.2 (*)    All other components within normal limits  CBC - Abnormal; Notable for the following components:   RBC 3.41 (*)    Hemoglobin 11.1 (*)    HCT 33.5 (*)    All other components within normal limits  URINALYSIS, COMPLETE (UACMP) WITH MICROSCOPIC - Abnormal; Notable for the following components:   Color, Urine STRAW (*)    APPearance HAZY (*)    Specific Gravity, Urine 1.002 (*)    Hgb urine dipstick SMALL (*)    Bacteria, UA RARE (*)    All other components within normal limits  LIPASE, BLOOD  PREGNANCY, URINE  POC URINE PREG, ED   ____________________________________________  ____________________________________________  RADIOLOGY  ED MD interpretation: Patient has an enlarged fluid collection in her uterus.  No evidence of appendicitis, diverticulitis, SBO, pancreatitis, cholecystitis or other clear acute intra-abdominal or pelvic pathology.  Official radiology report(s): CT ABDOMEN PELVIS W CONTRAST  Result Date: 10/08/2020 CLINICAL DATA:  Abdominal distension and cramping lower abdominal pain for several days. Last bowel movement 2 days ago. Nausea and vomiting over the past 3 days. Previous endometrial ablation on 07/31/2016. EXAM: CT ABDOMEN AND PELVIS WITH CONTRAST TECHNIQUE: Multidetector CT imaging of the abdomen and pelvis was performed using the standard protocol following bolus administration of intravenous contrast. CONTRAST:  OMNIPAQUE IOHEXOL 300 MG/ML  SOLN COMPARISON:  07/17/2020.  Pelvic ultrasound dated 07/09/2019. FINDINGS: Lower chest: Unremarkable. Hepatobiliary: No focal liver abnormality is seen. No gallstones, gallbladder wall thickening, or biliary dilatation. Pancreas: Unremarkable. No  pancreatic ductal dilatation or surrounding inflammatory changes. Spleen: Normal in size without focal abnormality. Adrenals/Urinary Tract: Adrenal  glands are unremarkable. Kidneys are normal, without renal calculi, focal lesion, or hydronephrosis. Bladder is unremarkable. Stomach/Bowel: Stomach is within normal limits. Appendix appears normal. No evidence of bowel wall thickening, distention, or inflammatory changes. Vascular/Lymphatic: No significant vascular findings are present. No enlarged abdominal or pelvic lymph nodes. Reproductive: There has been an increase in size of an oval collection of fluid distending the endometrium in the mid and upper uterus. On sagittal image number 52, this currently measures 4.2 x 3.3 cm and 3.9 cm in width on axial image number 57 series 2. This previously measured 2.5 x 3.5 x 3.2 cm in corresponding dimensions on 07/17/2020. This was not present on 07/09/2019. There are some mildly thickened peripheral enhancing membranes within this superiorly. Other: No abdominal wall hernia or abnormality. No abdominopelvic ascites. Musculoskeletal: Unremarkable bones. IMPRESSION: 1. Interval increase in size of an oval collection of fluid distending the endometrium in the mid and upper uterus, currently measuring 4.2 x 3.3 x 3.9 cm. This previously measured 2.5 x 3.5 x 3.2 cm. 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 The possibility of infected fluid needs to be excluded clinically. There is no CT visible fetal pole to indicate a pregnancy. This could be better excluded with a pregnancy test. A cystic neoplasm is unlikely but not excluded. 2. Otherwise, unremarkable examination. Electronically Signed   By: Beckie Salts M.D.   On: 10/08/2020 11:24    ____________________________________________   PROCEDURES  Procedure(s) performed (including Critical  Care):  Procedures   ____________________________________________   INITIAL IMPRESSION / ASSESSMENT AND PLAN / ED COURSE      Patient presents with [place] to history exam for assessment of worsening lower abdominal pain over the last 3 days associate with nonbloody nonbilious vomiting as well as some constipation.  She is slightly tachycardic with otherwise stable vital signs on room air.   Differential includes but is not limited to cystitis, pyelonephritis, cholecystitis, pancreatitis, diverticulitis, kidney stone, SBO, PID, and constipation pain.  CBC shows stable hemoglobin with no leukocytosis and normal platelets.  Urine has small blood and rare bacteria but otherwise no evidence of infection or other significant abnormalities.  Lipase WNL not consistent with acute pancreatitis.   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  Low suspicion for acute infectious etiology given patient denies any abnormal discharge or bleeding and has unremarkable UA no fever or elevated white blood cell count.  Did discuss the CT findings with on-call OB/GYN physician Dr. Valentino Saxon recommended pain control with plan for admission if pain is unable to be controlled in ED versus outpatient OB/GYN follow-up if pain is able to be controlled.  Patient given below noted analgesia.  On reassessment patient states her pain is much improved and she is comfortable with outpatient follow-up.  Rx written for below noted analgesia and antiemetic.  Discharge stable condition.  Strict return precautions advised and discussed.  Emphasized importance of strong bowel regimen including MiraLAX senna and enema as her Percocet could certainly exacerbate her constipation.  Patient voiced understanding and agreement this plan.      ____________________________________________   FINAL CLINICAL IMPRESSION(S) / ED  DIAGNOSES  Final diagnoses:  Pelvic pain  Constipation, unspecified constipation type  Fluid in endometrial cavity    Medications  HYDROmorphone (DILAUDID) injection 0.5 mg (0.5 mg Intravenous Given 10/08/20 0930)  ondansetron (ZOFRAN) injection 4 mg (4 mg  Intravenous Given 10/08/20 0930)  lactated ringers bolus 1,000 mL (0 mLs Intravenous Stopped 10/08/20 1057)  iohexol (OMNIPAQUE) 300 MG/ML solution 100 mL (100 mLs Intravenous Contrast Given 10/08/20 1059)  ketorolac (TORADOL) 30 MG/ML injection 30 mg (30 mg Intravenous Given 10/08/20 1215)  acetaminophen (TYLENOL) tablet 1,000 mg (1,000 mg Oral Given 10/08/20 1214)  oxyCODONE (Oxy IR/ROXICODONE) immediate release tablet 5 mg (5 mg Oral Given 10/08/20 1214)     ED Discharge Orders         Ordered    oxyCODONE-acetaminophen (PERCOCET) 5-325 MG tablet  Every 8 hours PRN        10/08/20 1232    ondansetron (ZOFRAN) 4 MG tablet  Every 8 hours PRN        10/08/20 1234           Note:  This document was prepared using Dragon voice recognition software and may include unintentional dictation errors.   Gilles Chiquito, MD 10/08/20 1233    Gilles Chiquito, MD 10/08/20 [phone]

## 2020-10-08 NOTE — ED Triage Notes (Signed)
Pt presents via POV c/o abd cramping x3 days. Reports worsening. Denies N/V/D.

## 2020-10-11 ENCOUNTER — Ambulatory Visit
Admission: EM | Admit: 2020-10-11 | Discharge: 2020-10-11 | Disposition: A | Discharge status: Home / Self Care | Payer: BC Managed Care – PPO | Attending: Emergency Medicine | Admitting: Emergency Medicine

## 2020-10-11 ENCOUNTER — Emergency Department: Payer: BC Managed Care – PPO | Admitting: Anesthesiology

## 2020-10-11 ENCOUNTER — Encounter
Admission: EM | Disposition: A | Discharge status: Home / Self Care | Payer: Self-pay | Source: Home / Self Care | Attending: Emergency Medicine

## 2020-10-11 ENCOUNTER — Encounter: Payer: Self-pay | Admitting: Emergency Medicine

## 2020-10-11 ENCOUNTER — Other Ambulatory Visit: Payer: Self-pay

## 2020-10-11 DIAGNOSIS — N898 Other specified noninflammatory disorders of vagina: Secondary | ICD-10-CM | POA: Diagnosis not present

## 2020-10-11 DIAGNOSIS — I10 Essential (primary) hypertension: Secondary | ICD-10-CM | POA: Diagnosis not present

## 2020-10-11 DIAGNOSIS — R102 Pelvic and perineal pain: Secondary | ICD-10-CM | POA: Insufficient documentation

## 2020-10-11 DIAGNOSIS — Z20822 Contact with and (suspected) exposure to covid-19: Secondary | ICD-10-CM | POA: Insufficient documentation

## 2020-10-11 DIAGNOSIS — D509 Iron deficiency anemia, unspecified: Secondary | ICD-10-CM | POA: Diagnosis not present

## 2020-10-11 DIAGNOSIS — Z886 Allergy status to analgesic agent status: Secondary | ICD-10-CM | POA: Diagnosis not present

## 2020-10-11 DIAGNOSIS — Z79899 Other long term (current) drug therapy: Secondary | ICD-10-CM | POA: Insufficient documentation

## 2020-10-11 DIAGNOSIS — Z888 Allergy status to other drugs, medicaments and biological substances status: Secondary | ICD-10-CM | POA: Diagnosis not present

## 2020-10-11 DIAGNOSIS — N736 Female pelvic peritoneal adhesions (postinfective): Secondary | ICD-10-CM | POA: Insufficient documentation

## 2020-10-11 DIAGNOSIS — F17210 Nicotine dependence, cigarettes, uncomplicated: Secondary | ICD-10-CM | POA: Insufficient documentation

## 2020-10-11 DIAGNOSIS — K449 Diaphragmatic hernia without obstruction or gangrene: Secondary | ICD-10-CM | POA: Diagnosis not present

## 2020-10-11 DIAGNOSIS — N857 Hematometra: Secondary | ICD-10-CM | POA: Diagnosis not present

## 2020-10-11 DIAGNOSIS — N858 Other specified noninflammatory disorders of uterus: Secondary | ICD-10-CM | POA: Diagnosis not present

## 2020-10-11 DIAGNOSIS — N939 Abnormal uterine and vaginal bleeding, unspecified: Secondary | ICD-10-CM | POA: Diagnosis not present

## 2020-10-11 HISTORY — PX: DILATATION & CURETTAGE/HYSTEROSCOPY WITH MYOSURE: SHX6511

## 2020-10-11 LAB — URINALYSIS, COMPLETE (UACMP) WITH MICROSCOPIC
Bilirubin Urine: NEGATIVE
Glucose, UA: NEGATIVE mg/dL
Ketones, ur: NEGATIVE mg/dL
Leukocytes,Ua: NEGATIVE
Nitrite: NEGATIVE
Protein, ur: NEGATIVE mg/dL
Specific Gravity, Urine: 1.005 (ref 1.005–1.030)
pH: 7 (ref 5.0–8.0)

## 2020-10-11 LAB — COMPREHENSIVE METABOLIC PANEL
ALT: 9 U/L (ref 0–44)
AST: 15 U/L (ref 15–41)
Albumin: 3.9 g/dL (ref 3.5–5.0)
Alkaline Phosphatase: 43 U/L (ref 38–126)
Anion gap: 10 (ref 5–15)
BUN: 9 mg/dL (ref 6–20)
CO2: 25 mmol/L (ref 22–32)
Calcium: 9.2 mg/dL (ref 8.9–10.3)
Chloride: 107 mmol/L (ref 98–111)
Creatinine, Ser: 0.52 mg/dL (ref 0.44–1.00)
GFR, Estimated: >60 mL/min (ref >60)
Glucose, Bld: 98 mg/dL (ref 70–99)
Potassium: 3.7 mmol/L (ref 3.5–5.1)
Sodium: 142 mmol/L (ref 135–145)
Total Bilirubin: 0.4 mg/dL (ref 0.3–1.2)
Total Protein: 6.9 g/dL (ref 6.5–8.1)

## 2020-10-11 LAB — CBC WITH DIFFERENTIAL/PLATELET
Abs Immature Granulocytes: 0.01 10*3/uL (ref 0.00–0.07)
Basophils Absolute: 0 10*3/uL (ref 0.0–0.1)
Basophils Relative: 0 %
Eosinophils Absolute: 0.1 10*3/uL (ref 0.0–0.5)
Eosinophils Relative: 4 %
HCT: 33.4 % — ABNORMAL LOW (ref 36.0–46.0)
Hemoglobin: 11.1 g/dL — ABNORMAL LOW (ref 12.0–15.0)
Immature Granulocytes: 0 %
Lymphocytes Relative: 32 %
Lymphs Abs: 1.3 10*3/uL (ref 0.7–4.0)
MCH: 32.6 pg (ref 26.0–34.0)
MCHC: 33.2 g/dL (ref 30.0–36.0)
MCV: 97.9 fL (ref 80.0–100.0)
Monocytes Absolute: 0.2 10*3/uL (ref 0.1–1.0)
Monocytes Relative: 4 %
Neutro Abs: 2.4 10*3/uL (ref 1.7–7.7)
Neutrophils Relative %: 60 %
Platelets: 326 10*3/uL (ref 150–400)
RBC: 3.41 MIL/uL — ABNORMAL LOW (ref 3.87–5.11)
RDW: 13.9 % (ref 11.5–15.5)
WBC: 4.1 10*3/uL (ref 4.0–10.5)
nRBC: 0 % (ref 0.0–0.2)

## 2020-10-11 LAB — TYPE AND SCREEN
ABO/RH(D): O POS
Antibody Screen: NEGATIVE

## 2020-10-11 LAB — WET PREP, GENITAL
Clue Cells Wet Prep HPF POC: NONE SEEN
Sperm: NONE SEEN
Trich, Wet Prep: NONE SEEN
Yeast Wet Prep HPF POC: NONE SEEN

## 2020-10-11 LAB — SARS CORONAVIRUS 2 BY RT PCR (HOSPITAL ORDER, PERFORMED IN ~~LOC~~ HOSPITAL LAB): SARS Coronavirus 2: NEGATIVE

## 2020-10-11 LAB — URINE DRUG SCREEN, QUALITATIVE (ARMC ONLY)
Amphetamines, Ur Screen: NOT DETECTED
Barbiturates, Ur Screen: NOT DETECTED
Benzodiazepine, Ur Scrn: NOT DETECTED
Cannabinoid 50 Ng, Ur ~~LOC~~: POSITIVE — AB
Cocaine Metabolite,Ur ~~LOC~~: NOT DETECTED
MDMA (Ecstasy)Ur Screen: NOT DETECTED
Methadone Scn, Ur: NOT DETECTED
Opiate, Ur Screen: NOT DETECTED
Phencyclidine (PCP) Ur S: NOT DETECTED
Tricyclic, Ur Screen: NOT DETECTED

## 2020-10-11 LAB — PREGNANCY, URINE: Preg Test, Ur: NEGATIVE

## 2020-10-11 LAB — LIPASE, BLOOD: Lipase: 23 U/L (ref 11–51)

## 2020-10-11 LAB — CHLAMYDIA/NGC RT PCR (ARMC ONLY)
Chlamydia Tr: NOT DETECTED
N gonorrhoeae: NOT DETECTED

## 2020-10-11 SURGERY — DILATATION & CURETTAGE/HYSTEROSCOPY WITH MYOSURE
Anesthesia: General | Site: Uterus

## 2020-10-11 MED ORDER — OXYCODONE HCL 5 MG PO TABS
ORAL_TABLET | ORAL | Status: AC
  Administered 2020-10-11: 5 mg via ORAL
  Filled 2020-10-11: qty 1

## 2020-10-11 MED ORDER — HYDROMORPHONE HCL 1 MG/ML IJ SOLN
INTRAMUSCULAR | Status: AC
  Administered 2020-10-11: 0.5 mg via INTRAVENOUS
  Filled 2020-10-11: qty 1

## 2020-10-11 MED ORDER — MIDAZOLAM HCL 2 MG/2ML IJ SOLN
INTRAMUSCULAR | Status: AC
  Filled 2020-10-11: qty 2

## 2020-10-11 MED ORDER — SUCCINYLCHOLINE CHLORIDE 200 MG/10ML IV SOSY
PREFILLED_SYRINGE | INTRAVENOUS | Status: AC
  Filled 2020-10-11: qty 10

## 2020-10-11 MED ORDER — MORPHINE SULFATE (PF) 4 MG/ML IV SOLN
4.0000 mg | Freq: Once | INTRAVENOUS | Status: AC
  Administered 2020-10-11: 4 mg via INTRAVENOUS
  Filled 2020-10-11: qty 1

## 2020-10-11 MED ORDER — GABAPENTIN 800 MG PO TABS: 800.0000 mg | ORAL_TABLET | Freq: Every day | ORAL | 0 refills | Status: DC

## 2020-10-11 MED ORDER — DOCUSATE SODIUM 100 MG PO CAPS: 100.0000 mg | ORAL_CAPSULE | Freq: Two times a day (BID) | ORAL | 0 refills | Status: AC

## 2020-10-11 MED ORDER — MIDAZOLAM HCL 2 MG/2ML IJ SOLN
INTRAMUSCULAR | Status: DC | PRN
  Administered 2020-10-11: 2 mg via INTRAVENOUS

## 2020-10-11 MED ORDER — SEVOFLURANE IN SOLN
RESPIRATORY_TRACT | Status: AC
  Filled 2020-10-11: qty 250

## 2020-10-11 MED ORDER — ONDANSETRON HCL 4 MG/2ML IJ SOLN
INTRAMUSCULAR | Status: AC
  Filled 2020-10-11: qty 2

## 2020-10-11 MED ORDER — DROPERIDOL 2.5 MG/ML IJ SOLN
0.6250 mg | Freq: Once | INTRAMUSCULAR | Status: DC | PRN
  Filled 2020-10-11: qty 2

## 2020-10-11 MED ORDER — DEXAMETHASONE SODIUM PHOSPHATE 10 MG/ML IJ SOLN
INTRAMUSCULAR | Status: AC
  Filled 2020-10-11: qty 1

## 2020-10-11 MED ORDER — ACETAMINOPHEN 10 MG/ML IV SOLN
INTRAVENOUS | Status: DC | PRN
  Administered 2020-10-11: 1000 mg via INTRAVENOUS

## 2020-10-11 MED ORDER — GLYCOPYRROLATE 0.2 MG/ML IJ SOLN
INTRAMUSCULAR | Status: AC
  Filled 2020-10-11: qty 1

## 2020-10-11 MED ORDER — MEPERIDINE HCL 50 MG/ML IJ SOLN: 6.2500 mg | INTRAMUSCULAR | Status: DC | PRN

## 2020-10-11 MED ORDER — PROPOFOL 10 MG/ML IV BOLUS
INTRAVENOUS | Status: AC
  Filled 2020-10-11: qty 20

## 2020-10-11 MED ORDER — PROPOFOL 10 MG/ML IV BOLUS
INTRAVENOUS | Status: DC | PRN
  Administered 2020-10-11: 150 mg via INTRAVENOUS

## 2020-10-11 MED ORDER — PHENYLEPHRINE HCL (PRESSORS) 10 MG/ML IV SOLN
INTRAVENOUS | Status: DC | PRN
  Administered 2020-10-11 (×2): 50 ug via INTRAVENOUS

## 2020-10-11 MED ORDER — PROMETHAZINE HCL 25 MG/ML IJ SOLN: 6.2500 mg | INTRAMUSCULAR | Status: DC | PRN

## 2020-10-11 MED ORDER — HYDROMORPHONE HCL 1 MG/ML IJ SOLN
0.2500 mg | INTRAMUSCULAR | Status: DC | PRN
  Administered 2020-10-11 (×2): 0.5 mg via INTRAVENOUS

## 2020-10-11 MED ORDER — FENTANYL CITRATE (PF) 100 MCG/2ML IJ SOLN
INTRAMUSCULAR | Status: DC | PRN
  Administered 2020-10-11 (×4): 25 ug via INTRAVENOUS

## 2020-10-11 MED ORDER — GLYCOPYRROLATE 0.2 MG/ML IJ SOLN
INTRAMUSCULAR | Status: DC | PRN
  Administered 2020-10-11: .2 mg via INTRAVENOUS

## 2020-10-11 MED ORDER — DEXAMETHASONE SODIUM PHOSPHATE 10 MG/ML IJ SOLN
INTRAMUSCULAR | Status: DC | PRN
  Administered 2020-10-11: 10 mg via INTRAVENOUS

## 2020-10-11 MED ORDER — DEXMEDETOMIDINE (PRECEDEX) IN NS 20 MCG/5ML (4 MCG/ML) IV SYRINGE
PREFILLED_SYRINGE | INTRAVENOUS | Status: DC | PRN
  Administered 2020-10-11 (×3): 4 ug via INTRAVENOUS
  Administered 2020-10-11: 8 ug via INTRAVENOUS

## 2020-10-11 MED ORDER — OXYCODONE HCL 5 MG PO TABS: 5.0000 mg | ORAL_TABLET | ORAL | 0 refills | Status: DC | PRN

## 2020-10-11 MED ORDER — ACETAMINOPHEN 500 MG PO TABS: 1000.0000 mg | ORAL_TABLET | Freq: Four times a day (QID) | ORAL | 0 refills | Status: AC

## 2020-10-11 MED ORDER — FENTANYL CITRATE (PF) 100 MCG/2ML IJ SOLN
INTRAMUSCULAR | Status: AC
  Filled 2020-10-11: qty 2

## 2020-10-11 MED ORDER — LACTATED RINGERS IV SOLN: INTRAVENOUS | Status: DC | PRN

## 2020-10-11 MED ORDER — ONDANSETRON HCL 4 MG/2ML IJ SOLN
INTRAMUSCULAR | Status: DC | PRN
  Administered 2020-10-11: 4 mg via INTRAVENOUS

## 2020-10-11 MED ORDER — LIDOCAINE HCL (PF) 2 % IJ SOLN
INTRAMUSCULAR | Status: AC
  Filled 2020-10-11: qty 10

## 2020-10-11 MED ORDER — LORAZEPAM 2 MG/ML IJ SOLN: 1.0000 mg | Freq: Once | INTRAMUSCULAR | Status: DC | PRN

## 2020-10-11 MED ORDER — ROCURONIUM BROMIDE 10 MG/ML (PF) SYRINGE
PREFILLED_SYRINGE | INTRAVENOUS | Status: AC
  Filled 2020-10-11: qty 10

## 2020-10-11 MED ORDER — ONDANSETRON HCL 4 MG/2ML IJ SOLN
4.0000 mg | Freq: Once | INTRAMUSCULAR | Status: AC
  Administered 2020-10-11: 4 mg via INTRAVENOUS
  Filled 2020-10-11: qty 2

## 2020-10-11 MED ORDER — ROCURONIUM BROMIDE 100 MG/10ML IV SOLN
INTRAVENOUS | Status: DC | PRN
  Administered 2020-10-11 (×2): 10 mg via INTRAVENOUS
  Administered 2020-10-11: 20 mg via INTRAVENOUS

## 2020-10-11 MED ORDER — OXYCODONE HCL 5 MG/5ML PO SOLN: 5.0000 mg | Freq: Once | ORAL | Status: AC | PRN

## 2020-10-11 MED ORDER — SUGAMMADEX SODIUM 200 MG/2ML IV SOLN
INTRAVENOUS | Status: DC | PRN
  Administered 2020-10-11: 200 mg via INTRAVENOUS

## 2020-10-11 MED ORDER — ACETAMINOPHEN 10 MG/ML IV SOLN
INTRAVENOUS | Status: AC
  Filled 2020-10-11: qty 100

## 2020-10-11 MED ORDER — LIDOCAINE HCL (CARDIAC) PF 100 MG/5ML IV SOSY
PREFILLED_SYRINGE | INTRAVENOUS | Status: DC | PRN
  Administered 2020-10-11: 60 mg via INTRAVENOUS

## 2020-10-11 MED ORDER — SUCCINYLCHOLINE CHLORIDE 20 MG/ML IJ SOLN
INTRAMUSCULAR | Status: DC | PRN
  Administered 2020-10-11: 140 mg via INTRAVENOUS

## 2020-10-11 MED ORDER — KETOROLAC TROMETHAMINE 30 MG/ML IJ SOLN
30.0000 mg | Freq: Once | INTRAMUSCULAR | Status: AC
  Administered 2020-10-11: 30 mg via INTRAVENOUS
  Filled 2020-10-11: qty 1

## 2020-10-11 MED ORDER — OXYCODONE HCL 5 MG PO TABS: 5.0000 mg | ORAL_TABLET | Freq: Once | ORAL | Status: AC | PRN

## 2020-10-11 SURGICAL SUPPLY — 20 items
CANISTER SUC SOCK COL 7IN (MISCELLANEOUS) ×1 IMPLANT
CANISTER SUCT 3000ML PPV (MISCELLANEOUS) ×1 IMPLANT
CATH ROBINSON RED A/P 16FR (CATHETERS) ×2 IMPLANT
COVER WAND RF STERILE (DRAPES) ×1 IMPLANT
DEVICE MYOSURE LITE (MISCELLANEOUS) ×1 IMPLANT
GLOVE INDICATOR 7.5 STRL GRN (GLOVE) ×2 IMPLANT
GLOVE SURG ENC MOIS LTX SZ7 (GLOVE) ×2 IMPLANT
GOWN STRL REUS W/ TWL LRG LVL3 (GOWN DISPOSABLE) ×2 IMPLANT
GOWN STRL REUS W/TWL LRG LVL3 (GOWN DISPOSABLE) ×4
KIT PROCEDURE FLUENT (KITS) ×2 IMPLANT
KIT TURNOVER CYSTO (KITS) ×2 IMPLANT
MANIFOLD NEPTUNE II (INSTRUMENTS) ×2 IMPLANT
NS IRRIG 500ML POUR BTL (IV SOLUTION) ×1 IMPLANT
PACK DNC HYST (MISCELLANEOUS) ×2 IMPLANT
PAD OB MATERNITY 4.3X12.25 (PERSONAL CARE ITEMS) ×2 IMPLANT
PAD PREP 24X41 OB/GYN DISP (PERSONAL CARE ITEMS) ×2 IMPLANT
SOL .9 NS 3000ML IRR  AL (IV SOLUTION) ×2
SOL .9 NS 3000ML IRR AL (IV SOLUTION) ×1
SOL .9 NS 3000ML IRR UROMATIC (IV SOLUTION) ×1 IMPLANT
TUBING CONNECTING 10 (TUBING) ×2 IMPLANT

## 2020-10-11 NOTE — H&P (View-Only) (Signed)
Consult History and Physical   SERVICE: Gynecology   Patient Name: Shirley [name] Patient MRN:   [id number]  CC: pelvic pain "abd as labor contractions"  HPI: [name] is a 45 y.o. [id number] with hx of C/s X4, with a month of worsening abdominal pain, now with 10/10 pain when it gets worse. Sent home several days ago with same pain that improved with iv narcotics, but has re-arisen.  CT scan noted enlarging complex fluid collection in uterus. She doesn't remember having her tubes tied.   Review of Systems: positives in bold GEN:   fevers, chills, weight changes, appetite changes, fatigue, night sweats HEENT:  HA, vision changes, hearing loss, congestion, rhinorrhea, sinus pressure, dysphagia CV:   CP, palpitations PULM:  SOB, cough GI:  abd pain, N/V/D/C GU:  dysuria, urgency, frequency MSK:  arthralgias, myalgias, back pain, swelling SKIN:  rashes, color changes, pallor NEURO:  numbness, weakness, tingling, seizures, dizziness, tremors PSYCH:  depression, anxiety, behavioral problems, confusion  HEME/LYMPH:  easy bruising or bleeding ENDO:  heat/cold intolerance  Past Obstetrical History: OB History    Gravida  5   Para  4   Term  3   Preterm  1   AB  1   Living  4     SAB      IAB      Ectopic      Multiple      Live Births           Obstetric Comments  c--section x 4. 36wk c-section x 1        Past Gynecologic History: No LMP recorded. Patient has had an ablation.  Past Medical History: Past Medical History:  Diagnosis Date  . Anemia   . Asthma   . Bacteremia   . Blood dyscrasia    has to apply a lot of pressure to stop bleeding  . GERD (gastroesophageal reflux disease)   . Heart murmur    with pregnancy  . History of hiatal hernia   . Hypertension    4-5 years ago, no meds  . Kidney infection    patient states I might have a kidney infection  . Migraine    migraines 1-2x/month  . Pyelonephritis   . Seizure (HCC)  05/09/2017   due to low potassium  . Wears dentures    partial upper and lower    Past Surgical History:   Past Surgical History:  Procedure Laterality Date  . ABDOMINAL HERNIA REPAIR    . CESAREAN SECTION     x 4  . CESAREAN SECTION     x 4  . COLONOSCOPY WITH PROPOFOL N/A 07/24/2019   Procedure: COLONOSCOPY WITH PROPOFOL;  Surgeon: Pasty Spillers, MD;  Location: [facility];  Service: Endoscopy;  Laterality: N/A;  . ENDOMETRIAL ABLATION  07/31/2016  . ESOPHAGOGASTRODUODENOSCOPY (EGD) WITH PROPOFOL N/A 07/24/2019   Procedure: ESOPHAGOGASTRODUODENOSCOPY (EGD) WITH PROPOFOL;  Surgeon: Pasty Spillers, MD;  Location: [facility];  Service: Endoscopy;  Laterality: N/A;  . HYSTEROSCOPY  07/31/2016   Procedure: HYSTEROSCOPY WITH HYDROTHERMAL ABLATION;  Surgeon: Mila Doce Bing, MD;  Location: [facility];  Service: Gynecology;;    Family History:  family history includes Colon cancer (age of onset: 53) in her father; Hypertension in her mother; Stroke in her brother.  Social History:  Social History   Socioeconomic History  . Marital status: Married    Spouse name: [name]  . Number of children: 4  . Years of  education: Not on file  . Highest education level: Not on file  Occupational History  . Not on file  Tobacco Use  . Smoking status: Current Every Day Smoker    Packs/day: 0.25    Types: Cigarettes  . Smokeless tobacco: Never Used  . Tobacco comment: 1 black and mild/day  Vaping Use  . Vaping Use: Never used  Substance and Sexual Activity  . Alcohol use: Yes    Comment: occasionally  . Drug use: Yes    Types: Marijuana    Comment: 10/25/2019  . Sexual activity: Yes    Birth control/protection: None  Other Topics Concern  . Not on file  Social History Narrative  . Not on file   Social Determinants of Health   Financial Resource Strain: Not on file  Food Insecurity: Not on file  Transportation Needs: Not on file  Physical Activity:  Not on file  Stress: Not on file  Social Connections: Not on file  Intimate Partner Violence: Not on file    Home Medications:  Medications reconciled in EPIC  Current Facility-Administered Medications on File Prior to Encounter  Medication Dose Route Frequency Provider Last Rate Last Admin  . 0.9 %  sodium chloride infusion  500 mL Intravenous Once Danis, Henry L III, MD       Current Outpatient Medications on File Prior to Encounter  Medication Sig Dispense Refill  . albuterol (PROVENTIL HFA;VENTOLIN HFA) 108 (90 Base) MCG/ACT inhaler Inhale 2 puffs into the lungs every 6 (six) hours as needed for wheezing or shortness of breath.    . amLODipine (NORVASC) 2.5 MG tablet Take 2.5 mg by mouth daily.  1  . Ascorbic Acid (VITAMIN C PO) Take by mouth daily.    . gabapentin (NEURONTIN) 100 MG capsule Take 1 capsule by mouth 1 day or 1 dose.    . magnesium oxide (MAG-OX) 400 MG tablet Take 400 mg by mouth 2 (two) times daily.    . Multiple Vitamins-Minerals (WOMENS DAILY FORMULA PO) Take 1 tablet by mouth daily.    . omeprazole (PRILOSEC) 40 MG capsule Take 1 capsule (40 mg total) by mouth 2 (two) times daily. 60 capsule 0  . ondansetron (ZOFRAN ODT) 4 MG disintegrating tablet 4mg ODT q4 hours prn nausea/vomit 20 tablet 0  . ondansetron (ZOFRAN) 4 MG tablet Take 1 tablet (4 mg total) by mouth every 8 (eight) hours as needed for up to 10 doses for nausea or vomiting. 10 tablet 0  . oxyCODONE-acetaminophen (PERCOCET) 5-325 MG tablet Take 1 tablet by mouth every 8 (eight) hours as needed for up to 5 days for severe pain. 15 tablet 0  . polyethylene glycol (MIRALAX / GLYCOLAX) packet Take 17 g by mouth daily as needed for mild constipation.     . Potassium Chloride ER 20 MEQ TBCR Take 1 tablet by mouth daily.    . sucralfate (CARAFATE) 1 g tablet Take 1 tablet (1 g total) by mouth 4 (four) times daily -  with meals and at bedtime. 120 tablet 0    Allergies:  Allergies  Allergen Reactions  .  Lisinopril Anaphylaxis and Swelling    Feet, hands, and throat became swollen  . Aspirin Nausea Only    Makes stomach burn  . Ibuprofen Nausea Only    Makes stomach burn  . Other Nausea Only and Other (See Comments)    No spicy foods = Indigestion, also    Physical Exam:  Temp:  [98.9 F (37.2 C)] 98.9   F (37.2 C) (02/01 0417) Pulse Rate:  [66-85] 70 (02/01 1045) Resp:  [17-20] 17 (02/01 1045) BP: (108-139)/(73-97) 116/83 (02/01 1045) SpO2:  [99 %-100 %] 100 % (02/01 1045) Weight:  [54 kg] 54 kg (02/01 0418)   General Appearance:  Well developed, well nourished, no acute distress, alert and oriented x3 HEENT:  Normocephalic atraumatic, extraocular movements intact, moist mucous membranes Cardiovascular:  Normal S1/S2, regular rate and rhythm, no murmurs Pulmonary:  clear to auscultation, no wheezes, rales or rhonchi, symmetric air entry, good air exchange Abdomen:  Bowel sounds present, soft, nontender, nondistended, no abnormal masses, no epigastric pain Extremities:  Full range of motion, no pedal edema, 2+ distal pulses, no tenderness Skin:  normal coloration and turgor, no rashes, no suspicious skin lesions noted  Neurologic:  Cranial nerves 2-12 grossly intact Psychiatric:  Normal mood and affect, appropriate, no AH/VH Pelvic: deferred   Labs/Studies:   CBC and Coags:  Lab Results  Component Value Date   WBC 4.1 10/11/2020   NEUTOPHILPCT 60 10/11/2020   EOSPCT 4 10/11/2020   BASOPCT 0 10/11/2020   LYMPHOPCT 32 10/11/2020   HGB 11.1 (L) 10/11/2020   HCT 33.4 (L) 10/11/2020   MCV 97.9 10/11/2020   PLT 326 10/11/2020   INR 1.0 12/12/2019   CMP:  Lab Results  Component Value Date   NA 142 10/11/2020   K 3.7 10/11/2020   CL 107 10/11/2020   CO2 25 10/11/2020   BUN 9 10/11/2020   CREATININE 0.52 10/11/2020   CREATININE 0.56 10/08/2020   CREATININE 0.62 07/20/2020   PROT 6.9 10/11/2020   BILITOT 0.4 10/11/2020   BILIDIR 0.2 09/10/2018   ALT 9 10/11/2020    AST 15 10/11/2020   ALKPHOS 43 10/11/2020    Other Imaging: CT ABDOMEN PELVIS W CONTRAST  Result Date: 10/08/2020 CLINICAL DATA:  Abdominal distension and cramping lower abdominal pain for several days. Last bowel movement 2 days ago. Nausea and vomiting over the past 3 days. Previous endometrial ablation on 07/31/2016. EXAM: CT ABDOMEN AND PELVIS WITH CONTRAST TECHNIQUE: Multidetector CT imaging of the abdomen and pelvis was performed using the standard protocol following bolus administration of intravenous contrast. CONTRAST:  OMNIPAQUE IOHEXOL 300 MG/ML  SOLN COMPARISON:  07/17/2020.  Pelvic ultrasound dated 07/09/2019. FINDINGS: Lower chest: Unremarkable. Hepatobiliary: No focal liver abnormality is seen. No gallstones, gallbladder wall thickening, or biliary dilatation. Pancreas: Unremarkable. No pancreatic ductal dilatation or surrounding inflammatory changes. Spleen: Normal in size without focal abnormality. Adrenals/Urinary Tract: Adrenal glands are unremarkable. Kidneys are normal, without renal calculi, focal lesion, or hydronephrosis. Bladder is unremarkable. Stomach/Bowel: Stomach is within normal limits. Appendix appears normal. No evidence of bowel wall thickening, distention, or inflammatory changes. Vascular/Lymphatic: No significant vascular findings are present. No enlarged abdominal or pelvic lymph nodes. Reproductive: There has been an increase in size of an oval collection of fluid distending the endometrium in the mid and upper uterus. On sagittal image number 52, this currently measures 4.2 x 3.3 cm and 3.9 cm in width on axial image number 57 series 2. This previously measured 2.5 x 3.5 x 3.2 cm in corresponding dimensions on 07/17/2020. This was not present on 07/09/2019. There are some mildly thickened peripheral enhancing membranes within this superiorly. Other: No abdominal wall hernia or abnormality. No abdominopelvic ascites. Musculoskeletal: Unremarkable bones. IMPRESSION:  1. Interval increase in size of an oval collection of fluid distending the endometrium in the mid and upper uterus, currently measuring 4.2 x 3.3 x 3.9 cm.  This previously measured 2.5 x 3.5 x 3.2 cm. 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 The possibility of infected fluid needs to be excluded clinically. There is no CT visible fetal pole to indicate a pregnancy. This could be better excluded with a pregnancy test. A cystic neoplasm is unlikely but not excluded. 2. Otherwise, unremarkable examination. Electronically Signed   By: Beckie Salts M.D.   On: 10/08/2020 11:24     Assessment / Plan:   MORINE KOHLMAN is a 45 y.o. 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 Hx of c/s x4, prior abdominal hernia repair x2  1. Consents signed today.   - Toradol ordered for now  We discussed that I may not be able to open the cervix or endometrial cavity enough to release the trapped blood, and that a hysterectomy would be recommended. We would add that on for later in the week but would not proceed today without surgical indication. - no evidence of infection - not currently bleeding -not pregnant -on no anticoagulants -covid swab now, will proceed to the OR when covid swab returns -NPO since yesterday  Risks of surgery were discussed with the patient including but not limited to: bleeding which may require transfusion; infection which may require antibiotics; injury to uterus or surrounding organs; intrauterine scarring which may impair future fertility; need for additional procedures including laparotomy or laparoscopy; and other postoperative/anesthesia complications. Verbal informed consent was obtained, written will be prior to the OR  Thank you for the opportunity to be involved with this pt's care.

## 2020-10-11 NOTE — Progress Notes (Signed)
Consult History and Physical   SERVICE: Gynecology   Patient Name: Shirley [name] Patient MRN:   [id number]  CC: pelvic pain "abd as labor contractions"  HPI: Shirley [name] is a 45 y.o. [id number] with hx of C/s X4, with a month of worsening abdominal pain, now with 10/10 pain when it gets worse. Sent home several days ago with same pain that improved with iv narcotics, but has re-arisen.  CT scan noted enlarging complex fluid collection in uterus. She doesn't remember having her tubes tied.   Review of Systems: positives in bold GEN:   fevers, chills, weight changes, appetite changes, fatigue, night sweats HEENT:  HA, vision changes, hearing loss, congestion, rhinorrhea, sinus pressure, dysphagia CV:   CP, palpitations PULM:  SOB, cough GI:  abd pain, N/V/D/C GU:  dysuria, urgency, frequency MSK:  arthralgias, myalgias, back pain, swelling SKIN:  rashes, color changes, pallor NEURO:  numbness, weakness, tingling, seizures, dizziness, tremors PSYCH:  depression, anxiety, behavioral problems, confusion  HEME/LYMPH:  easy bruising or bleeding ENDO:  heat/cold intolerance  Past Obstetrical History: OB History    Gravida  5   Para  4   Term  3   Preterm  1   AB  1   Living  4     SAB      IAB      Ectopic      Multiple      Live Births           Obstetric Comments  c--section x 4. 36wk c-section x 1        Past Gynecologic History: No LMP recorded. Patient has had an ablation.  Past Medical History: Past Medical History:  Diagnosis Date  . Anemia   . Asthma   . Bacteremia   . Blood dyscrasia    has to apply a lot of pressure to stop bleeding  . GERD (gastroesophageal reflux disease)   . Heart murmur    with pregnancy  . History of hiatal hernia   . Hypertension    4-5 years ago, no meds  . Kidney infection    patient states I might have a kidney infection  . Migraine    migraines 1-2x/month  . Pyelonephritis   . Seizure (HCC)  05/09/2017   due to low potassium  . Wears dentures    partial upper and lower    Past Surgical History:   Past Surgical History:  Procedure Laterality Date  . ABDOMINAL HERNIA REPAIR    . CESAREAN SECTION     x 4  . CESAREAN SECTION     x 4  . COLONOSCOPY WITH PROPOFOL N/A 07/24/2019   Procedure: COLONOSCOPY WITH PROPOFOL;  Surgeon: Pasty Spillers, MD;  Location: [facility];  Service: Endoscopy;  Laterality: N/A;  . ENDOMETRIAL ABLATION  07/31/2016  . ESOPHAGOGASTRODUODENOSCOPY (EGD) WITH PROPOFOL N/A 07/24/2019   Procedure: ESOPHAGOGASTRODUODENOSCOPY (EGD) WITH PROPOFOL;  Surgeon: Pasty Spillers, MD;  Location: [facility];  Service: Endoscopy;  Laterality: N/A;  . HYSTEROSCOPY  07/31/2016   Procedure: HYSTEROSCOPY WITH HYDROTHERMAL ABLATION;  Surgeon: Mila Doce Bing, MD;  Location: [facility];  Service: Gynecology;;    Family History:  family history includes Colon cancer (age of onset: 53) in her father; Hypertension in her mother; Stroke in her brother.  Social History:  Social History   Socioeconomic History  . Marital status: Married    Spouse name: Shirley [name]  . Number of children: 4  . Years of  education: Not on file  . Highest education level: Not on file  Occupational History  . Not on file  Tobacco Use  . Smoking status: Current Every Day Smoker    Packs/day: 0.25    Types: Cigarettes  . Smokeless tobacco: Never Used  . Tobacco comment: 1 black and mild/day  Vaping Use  . Vaping Use: Never used  Substance and Sexual Activity  . Alcohol use: Yes    Comment: occasionally  . Drug use: Yes    Types: Marijuana    Comment: 10/25/2019  . Sexual activity: Yes    Birth control/protection: None  Other Topics Concern  . Not on file  Social History Narrative  . Not on file   Social Determinants of Health   Financial Resource Strain: Not on file  Food Insecurity: Not on file  Transportation Needs: Not on file  Physical Activity:  Not on file  Stress: Not on file  Social Connections: Not on file  Intimate Partner Violence: Not on file    Home Medications:  Medications reconciled in EPIC  Current Facility-Administered Medications on File Prior to Encounter  Medication Dose Route Frequency Provider Last Rate Last Admin  . 0.9 %  sodium chloride infusion  500 mL Intravenous Once Sherrilyn Rist, MD       Current Outpatient Medications on File Prior to Encounter  Medication Sig Dispense Refill  . albuterol (PROVENTIL HFA;VENTOLIN HFA) 108 (90 Base) MCG/ACT inhaler Inhale 2 puffs into the lungs every 6 (six) hours as needed for wheezing or shortness of breath.    Marland Kitchen amLODipine (NORVASC) 2.5 MG tablet Take 2.5 mg by mouth daily.  1  . Ascorbic Acid (VITAMIN C PO) Take by mouth daily.    Marland Kitchen gabapentin (NEURONTIN) 100 MG capsule Take 1 capsule by mouth 1 day or 1 dose.    . magnesium oxide (MAG-OX) 400 MG tablet Take 400 mg by mouth 2 (two) times daily.    . Multiple Vitamins-Minerals (WOMENS DAILY FORMULA PO) Take 1 tablet by mouth daily.    Marland Kitchen omeprazole (PRILOSEC) 40 MG capsule Take 1 capsule (40 mg total) by mouth 2 (two) times daily. 60 capsule 0  . ondansetron (ZOFRAN ODT) 4 MG disintegrating tablet 4mg  ODT q4 hours prn nausea/vomit 20 tablet 0  . ondansetron (ZOFRAN) 4 MG tablet Take 1 tablet (4 mg total) by mouth every 8 (eight) hours as needed for up to 10 doses for nausea or vomiting. 10 tablet 0  . oxyCODONE-acetaminophen (PERCOCET) 5-325 MG tablet Take 1 tablet by mouth every 8 (eight) hours as needed for up to 5 days for severe pain. 15 tablet 0  . polyethylene glycol (MIRALAX / GLYCOLAX) packet Take 17 g by mouth daily as needed for mild constipation.     . Potassium Chloride ER 20 MEQ TBCR Take 1 tablet by mouth daily.    . sucralfate (CARAFATE) 1 g tablet Take 1 tablet (1 g total) by mouth 4 (four) times daily -  with meals and at bedtime. 120 tablet 0    Allergies:  Allergies  Allergen Reactions  .  Lisinopril Anaphylaxis and Swelling    Feet, hands, and throat became swollen  . Aspirin Nausea Only    Makes stomach burn  . Ibuprofen Nausea Only    Makes stomach burn  . Other Nausea Only and Other (See Comments)    No spicy foods = Indigestion, also    Physical Exam:  Temp:  [98.9 F (37.2 C)] 98.9  F (37.2 C) (02/01 0417) Pulse Rate:  [66-85] 70 (02/01 1045) Resp:  [17-20] 17 (02/01 1045) BP: (108-139)/(73-97) 116/83 (02/01 1045) SpO2:  [99 %-100 %] 100 % (02/01 1045) Weight:  [54 kg] 54 kg (02/01 0418)   General Appearance:  Well developed, well nourished, no acute distress, alert and oriented x3 HEENT:  Normocephalic atraumatic, extraocular movements intact, moist mucous membranes Cardiovascular:  Normal S1/S2, regular rate and rhythm, no murmurs Pulmonary:  clear to auscultation, no wheezes, rales or rhonchi, symmetric air entry, good air exchange Abdomen:  Bowel sounds present, soft, nontender, nondistended, no abnormal masses, no epigastric pain Extremities:  Full range of motion, no pedal edema, 2+ distal pulses, no tenderness Skin:  normal coloration and turgor, no rashes, no suspicious skin lesions noted  Neurologic:  Cranial nerves 2-12 grossly intact Psychiatric:  Normal mood and affect, appropriate, no AH/VH Pelvic: deferred   Labs/Studies:   CBC and Coags:  Lab Results  Component Value Date   WBC 4.1 10/11/2020   NEUTOPHILPCT 60 10/11/2020   EOSPCT 4 10/11/2020   BASOPCT 0 10/11/2020   LYMPHOPCT 32 10/11/2020   HGB 11.1 (L) 10/11/2020   HCT 33.4 (L) 10/11/2020   MCV 97.9 10/11/2020   PLT 326 10/11/2020   INR 1.0 12/12/2019   CMP:  Lab Results  Component Value Date   NA 142 10/11/2020   K 3.7 10/11/2020   CL 107 10/11/2020   CO2 25 10/11/2020   BUN 9 10/11/2020   CREATININE 0.52 10/11/2020   CREATININE 0.56 10/08/2020   CREATININE 0.62 07/20/2020   PROT 6.9 10/11/2020   BILITOT 0.4 10/11/2020   BILIDIR 0.2 09/10/2018   ALT 9 10/11/2020    AST 15 10/11/2020   ALKPHOS 43 10/11/2020    Other Imaging: CT ABDOMEN PELVIS W CONTRAST  Result Date: 10/08/2020 CLINICAL DATA:  Abdominal distension and cramping lower abdominal pain for several days. Last bowel movement 2 days ago. Nausea and vomiting over the past 3 days. Previous endometrial ablation on 07/31/2016. EXAM: CT ABDOMEN AND PELVIS WITH CONTRAST TECHNIQUE: Multidetector CT imaging of the abdomen and pelvis was performed using the standard protocol following bolus administration of intravenous contrast. CONTRAST:  OMNIPAQUE IOHEXOL 300 MG/ML  SOLN COMPARISON:  07/17/2020.  Pelvic ultrasound dated 07/09/2019. FINDINGS: Lower chest: Unremarkable. Hepatobiliary: No focal liver abnormality is seen. No gallstones, gallbladder wall thickening, or biliary dilatation. Pancreas: Unremarkable. No pancreatic ductal dilatation or surrounding inflammatory changes. Spleen: Normal in size without focal abnormality. Adrenals/Urinary Tract: Adrenal glands are unremarkable. Kidneys are normal, without renal calculi, focal lesion, or hydronephrosis. Bladder is unremarkable. Stomach/Bowel: Stomach is within normal limits. Appendix appears normal. No evidence of bowel wall thickening, distention, or inflammatory changes. Vascular/Lymphatic: No significant vascular findings are present. No enlarged abdominal or pelvic lymph nodes. Reproductive: There has been an increase in size of an oval collection of fluid distending the endometrium in the mid and upper uterus. On sagittal image number 52, this currently measures 4.2 x 3.3 cm and 3.9 cm in width on axial image number 57 series 2. This previously measured 2.5 x 3.5 x 3.2 cm in corresponding dimensions on 07/17/2020. This was not present on 07/09/2019. There are some mildly thickened peripheral enhancing membranes within this superiorly. Other: No abdominal wall hernia or abnormality. No abdominopelvic ascites. Musculoskeletal: Unremarkable bones. IMPRESSION:  1. Interval increase in size of an oval collection of fluid distending the endometrium in the mid and upper uterus, currently measuring 4.2 x 3.3 x 3.9 cm.  This previously measured 2.5 x 3.5 x 3.2 cm. 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 The possibility of infected fluid needs to be excluded clinically. There is no CT visible fetal pole to indicate a pregnancy. This could be better excluded with a pregnancy test. A cystic neoplasm is unlikely but not excluded. 2. Otherwise, unremarkable examination. Electronically Signed   By: Beckie Salts M.D.   On: 10/08/2020 11:24     Assessment / Plan:   MORINE KOHLMAN is a 45 y.o. 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 Hx of c/s x4, prior abdominal hernia repair x2  1. Consents signed today.   - Toradol ordered for now  We discussed that I may not be able to open the cervix or endometrial cavity enough to release the trapped blood, and that a hysterectomy would be recommended. We would add that on for later in the week but would not proceed today without surgical indication. - no evidence of infection - not currently bleeding -not pregnant -on no anticoagulants -covid swab now, will proceed to the OR when covid swab returns -NPO since yesterday  Risks of surgery were discussed with the patient including but not limited to: bleeding which may require transfusion; infection which may require antibiotics; injury to uterus or surrounding organs; intrauterine scarring which may impair future fertility; need for additional procedures including laparotomy or laparoscopy; and other postoperative/anesthesia complications. Verbal informed consent was obtained, written will be prior to the OR  Thank you for the opportunity to be involved with this pt's care.

## 2020-10-11 NOTE — ED Notes (Signed)
OBGYN at bedside. Plans for toradol medication, covid swab and OR.

## 2020-10-11 NOTE — Anesthesia Preprocedure Evaluation (Signed)
Anesthesia Evaluation  Patient identified by MRN, date of birth, ID band Patient awake    Reviewed: Allergy & Precautions, H&P , NPO status , Patient's Chart, lab work & pertinent test results  Airway Mallampati: II       Dental  (+) Chipped   Pulmonary asthma , Current Smoker and Patient abstained from smoking.,    Pulmonary exam normal breath sounds clear to auscultation       Cardiovascular hypertension, + Valvular Problems/Murmurs  Rhythm:Regular + Diastolic murmurs    Neuro/Psych  Headaches, Seizures -,  negative psych ROS   GI/Hepatic Neg liver ROS, hiatal hernia, PUD, GERD  ,  Endo/Other  negative endocrine ROS  Renal/GU   negative genitourinary   Musculoskeletal negative musculoskeletal ROS (+)   Abdominal   Peds negative pediatric ROS (+)  Hematology negative hematology ROS (+)   Anesthesia Other Findings   Reproductive/Obstetrics negative OB ROS                             Anesthesia Physical Anesthesia Plan  ASA: III  Anesthesia Plan: General   Post-op Pain Management:    Induction: Intravenous  PONV Risk Score and Plan: 2 and Ondansetron and Dexamethasone  Airway Management Planned: Oral ETT  Additional Equipment:   Intra-op Plan:   Post-operative Plan:   Informed Consent: I have reviewed the patients History and Physical, chart, labs and discussed the procedure including the risks, benefits and alternatives for the proposed anesthesia with the patient or authorized representative who has indicated his/her understanding and acceptance.     Dental advisory given  Plan Discussed with: CRNA, Anesthesiologist and Surgeon  Anesthesia Plan Comments:         Anesthesia Quick Evaluation

## 2020-10-11 NOTE — Interval H&P Note (Signed)
History and Physical Interval Note:  10/11/2020 1:21 PM  Shirley [name]  has presented today for surgery, with the diagnosis of bleeding.  The various methods of treatment have been discussed with the patient and family. After consideration of risks, benefits and other options for treatment, the patient has consented to  Procedure(s): DILATATION & CURETTAGE/HYSTEROSCOPY WITH MYOSURE (N/A) as a surgical intervention.  The patient's history has been reviewed, patient examined, no change in status, stable for surgery.  I have reviewed the patient's chart and labs.  Questions were answered to the patient's satisfaction.     Christeen Douglas   Will call husband after surgery "Philis Fendt" [phone]

## 2020-10-11 NOTE — Anesthesia Procedure Notes (Signed)
Procedure Name: Intubation Date/Time: 10/11/2020 1:54 PM Performed by: Joanette Gula, Alverda Nazzaro, CRNA Pre-anesthesia Checklist: Patient identified, Emergency Drugs available, Suction available and Patient being monitored Patient Re-evaluated:Patient Re-evaluated prior to induction Oxygen Delivery Method: Circle system utilized Preoxygenation: Pre-oxygenation with 100% oxygen Induction Type: IV induction Ventilation: Mask ventilation without difficulty Laryngoscope Size: McGraph and 3 Grade View: Grade I Tube type: Oral Tube size: 7.0 mm Number of attempts: 1 Airway Equipment and Method: Stylet Placement Confirmation: ETT inserted through vocal cords under direct vision,  positive ETCO2 and breath sounds checked- equal and bilateral Secured at: 20 cm Tube secured with: Tape Dental Injury: Teeth and Oropharynx as per pre-operative assessment

## 2020-10-11 NOTE — Anesthesia Postprocedure Evaluation (Signed)
Anesthesia Post Note  Patient: Shirley [name]  Procedure(s) Performed: DILATATION & CURETTAGE/HYSTEROSCOPY WITH MYOSURE LYSIS OF ADHESIONS (N/A Uterus)  Patient location during evaluation: PACU Anesthesia Type: General Level of consciousness: awake Pain management: pain level controlled Vital Signs Assessment: post-procedure vital signs reviewed and stable Respiratory status: spontaneous breathing Cardiovascular status: stable Postop Assessment: no apparent nausea or vomiting Anesthetic complications: no   No complications documented.   Last Vitals:  Vitals:   10/11/20 1458 10/11/20 1500  BP: (!) 136/91 (!) 141/103  Pulse: 72 66  Resp: (!) 23 11  Temp: 36.7 C   SpO2: 100% 100%    Last Pain:  Vitals:   10/11/20 1458  TempSrc:   PainSc: 8                  Emilio Math

## 2020-10-11 NOTE — Transfer of Care (Signed)
Immediate Anesthesia Transfer of Care Note  Patient: Shirley [name]  Procedure(s) Performed: DILATATION & CURETTAGE/HYSTEROSCOPY WITH MYOSURE LYSIS OF ADHESIONS (N/A Uterus)  Patient Location: PACU  Anesthesia Type:General  Level of Consciousness: drowsy  Airway & Oxygen Therapy: Patient Spontanous Breathing and Patient connected to face mask oxygen  Post-op Assessment: Report given to RN and Post -op Vital signs reviewed and stable  Post vital signs: Reviewed and stable  Last Vitals:  Vitals Value Taken Time  BP 136/91 10/11/20 1458  Temp 36.7 C 10/11/20 1458  Pulse 66 10/11/20 1459  Resp 11 10/11/20 1459  SpO2 100 % 10/11/20 1459  Vitals shown include unvalidated device data.  Last Pain:  Vitals:   10/11/20 1316  TempSrc: Temporal  PainSc: 5          Complications: No complications documented.

## 2020-10-11 NOTE — ED Triage Notes (Signed)
Pt to triage via w/c with no distress noted; pt reports lower abd pain accomp by N/V; st has appt with OB/Gyn later this week but having persistent pain; seen recently for same

## 2020-10-11 NOTE — ED Notes (Signed)
Report given to [facility] RN OR at this time.

## 2020-10-11 NOTE — Discharge Instructions (Signed)
AMBULATORY SURGERY  DISCHARGE INSTRUCTIONS   1) The drugs that you were given will stay in your system until tomorrow so for the next 24 hours you should not:  A) Drive an automobile B) Make any legal decisions C) Drink any alcoholic beverage   2) You may resume regular meals tomorrow.  Today it is better to start with liquids and gradually work up to solid foods.  You may eat anything you prefer, but it is better to start with liquids, then soup and crackers, and gradually work up to solid foods.   3) Please notify your doctor immediately if you have any unusual bleeding, trouble breathing, redness and pain at the surgery site, drainage, fever, or pain not relieved by medication. 4)   5) Your post-operative visit with Dr.                                     is: Date:                        Time:    Please call to schedule your post-operative visit.  6) Additional Instructions:     Discharge instructions after a hysteroscopy with dilation and curettage Until you feel better, consider taking acetaminophen on a schedule, every 8 hours.  I also gave you an increased dose of gabapentin for nighttime, to help you sleep and also to control pain. Take gabapentin medicines at night for at least the next 3 nights. You also have a narcotic, oxycodone, to take as needed if the above medicines don't help.  Postop constipation is a major cause of pain. Stay well hydrated, walk as you tolerate, and take over the counter senna as well as stool softeners if you need them.  Signs and Symptoms to Report  Call our office at [phone] if you have any of the following:   . Fever over 100.4 degrees or higher . Severe stomach pain not relieved with pain medications . Bright red bleeding that's heavier than a period that does not slow with rest after the first 24 hours . To go the bathroom a lot (frequency), you can't hold your urine (urgency), or it hurts when you empty your bladder  (urinate) . Chest pain . Shortness of breath . Pain in the calves of your legs . Severe nausea and vomiting not relieved with anti-nausea medications . Any concerns  What You Can Expect after Surgery . You may see some pink tinged, bloody fluid. This is normal. You may also have cramping for several days.   Activities after Your Discharge Follow these guidelines to help speed your recovery at home: . Don't drive if you are in pain or taking narcotic pain medicine. You may drive when you can safely slam on the brakes, turn the wheel forcefully, and rotate your torso comfortably. This is typically 4-7 days. Practice in a parking lot or side street prior to attempting to drive regularly.  . Ask others to help with household chores for 4 weeks. . Don't do strenuous activities, exercises, or sports like vacuuming, tennis, squash, etc. until your doctor says it is safe to do so. . Walk as you feel able. Rest often since it may take a week or two for your energy level to return to normal.  . You may climb stairs . Avoid constipation:   -Eat fruits, vegetables, and whole grains. Eat  small meals as your appetite will take time to return to normal.   -Drink 6 to 8 glasses of water each day unless your doctor has told you to limit your fluids.   -Use a laxative or stool softener as needed if constipation becomes a problem. You may take Miralax, metamucil, Citrucil, Colace, Senekot, FiberCon, etc. If this does not relieve the constipation, try two tablespoons of Milk Of Magnesia every 8 hours until your bowels move.  . You may shower.  . Do not get in a hot tub, swimming pool, etc. until your doctor agrees. . Do not douche, use tampons, or have sex until your doctor says it is okay, usually about 2 weeks. . Take your pain medicine when you need it. The medicine may not work as well if the pain is bad.  Take the medicines you were taking before surgery. Other medications you might need are pain  medications (ibuprofen), medications for constipation (Colace) and nausea medications (Zofran).

## 2020-10-11 NOTE — Op Note (Signed)
Operative Report Hysteroscopy with Dilation and Curettage   Indications: Pelvic pain   Pre-operative Diagnosis: Suspected hematometria   Post-operative Diagnosis: Endometrial adhesions.  Procedure: 1. Exam under anesthesia 2. Cervical dilation 3. D&C 4. Hysteroscopy 5. Myosure adhesiolysis  Surgeon: Christeen Douglas, MD  Assistant(s):  None  Anesthesia: General LMA anesthesia  Anesthesiologist: Emilio Math, DO Anesthesiologist: Emilio Math, DO CRNA: Jeanine Luz, CRNA  Estimated Blood Loss:  Minimal         Intraoperative medications: none         Total IV Fluids:  Urine Output: 39ml  Total Fluid Deficit:  na          Specimens: Endocervical curettings and endometrial curettings         Complications:  None; patient tolerated the procedure well.         Disposition: PACU - hemodynamically stable.         Condition: stable  Findings: Normal external female genitalia.  Uterus measuring 5 cm by sound.  The uterus is severely adherent to the anterior abdominal wall, with her cervix noted almost directly behind the pubic bone along the anterior vaginal wall.  On visualization, her cervical canal was normal, and it appeared that we were able to enter into the left side of the endometrium.  I was able to see the left fallopian tube cornua, but did not identify the left fallopian tube ostium.  There is no ability to visualize the right side of the endometrium due to adhesions on the side..  No blood clots were noted in the space that I could see.  Given the findings, I think it likely that her pain will not subside.  I think she will need to proceed with hysterectomy, and I will recommend that she see the minimally invasive gynecologic surgical specialist to proceed.  Alternatively, we could attempt a laparoscopic hysterectomy or a open hysterectomy.  Indication for procedure/Consents: 45 y.o. [id number]  here for scheduled surgery for the aforementioned  diagnoses.  She presented to the emergency room twice this week with significantly worsening pelvic pain, and a CT scan found A 4 x 4 centimeter collection of fluid in her endometrium. 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   Risks of surgery were discussed with the patient including but not limited to: bleeding which may require transfusion; infection which may require antibiotics; injury to uterus or surrounding organs; intrauterine scarring which may impair future fertility; need for additional procedures including laparotomy or laparoscopy; and other postoperative/anesthesia complications. Written informed consent was obtained.    Procedure Details:  D&C/ Myosure  The patient was taken to the operating room where anesthesia was administered and was found to be adequate. After a formal and adequate timeout was performed, she was placed in the dorsal lithotomy position and examined with the above findings. She was then prepped and draped in the sterile manner. Her bladder was catheterized for an estimated amount of clear, yellow urine. A weighed speculum was then placed in the patient's vagina and a single tooth tenaculum was applied to the anterior lip of the cervix.  The cervix was difficult to find given her distorted anatomy with a significantly anteverted uterus.  Her cervix was serially dilated to 15 [place] using Hanks dilators. The satisfying entry beyond the endocervical os into the uterus was not noted.  The hysteroscope was introduced under direct observation  Using lactated ringers as a distention medium to  reveal the above findings. The uterine cavity was carefully examined, and careful resection of the visible adhesions was undertaken using the MyoSure device.  However, great care was taken not to perforate the uterus and we did not see the blood clot noted on CT scan in the space I was able to safely visualize.   After further  careful visualization of the uterine cavity, the hysteroscope was removed under direct visualization.  A sharp curettage was then carefully performed. The tenaculum was removed from the anterior lip of the cervix and the vaginal speculum was removed.   The patient tolerated the procedure well and was taken to the recovery area awake and in stable condition. She received iv acetaminophen and Toradol prior to leaving the OR.  The patient will be discharged to home as per PACU criteria. Routine postoperative instructions given. She was prescribed Ibuprofen and Colace. She will follow up in the clinic in two weeks for postoperative evaluation.

## 2020-10-11 NOTE — ED Provider Notes (Signed)
[facility] Emergency Department Provider Note   ____________________________________________    I have reviewed the triage vital signs and the nursing notes.   HISTORY  Chief Complaint Abdominal Pain     HPI Shirley [name] is a 45 y.o. female with past medical history as detailed below who presents with complaints of lower abdominal pain.  Patient was seen here 2 days ago for similar complaints, she had brief pain relief in the emergency department but reports pain began again shortly after discharge.  She has had this pain for about a week.  She denies vaginal discharge, no dysuria, no fevers or chills, some nausea from pain.  No vomiting.  Has been taking analgesics without improvement.  Has OB/GYN appointment scheduled  Past Medical History:  Diagnosis Date  . Anemia   . Asthma   . Bacteremia   . Blood dyscrasia    has to apply a lot of pressure to stop bleeding  . GERD (gastroesophageal reflux disease)   . Heart murmur    with pregnancy  . History of hiatal hernia   . Hypertension    4-5 years ago, no meds  . Kidney infection    patient states I might have a kidney infection  . Migraine    migraines 1-2x/month  . Pyelonephritis   . Seizure (HCC) 05/09/2017   due to low potassium  . Wears dentures    partial upper and lower    Patient Active Problem List   Diagnosis Date Noted  . Iron deficiency anemia 08/10/2019  . Acute peptic ulcer of stomach   . Dysphagia, idiopathic   . Nausea   . Abnormal CT scan, colon   . Pyelonephritis 08/03/2017  . Bacteremia 08/03/2017  . Hypokalemia 08/03/2017  . Tobacco use 08/03/2017  . Acute postoperative pain 07/31/2016  . Menometrorrhagia 04/09/2016  . Anemia 04/09/2016  . Abnormal uterine bleeding (AUB) 04/09/2016  . Essential hypertension 11/17/2009    Past Surgical History:  Procedure Laterality Date  . ABDOMINAL HERNIA REPAIR    . CESAREAN SECTION     x 4  . CESAREAN  SECTION     x 4  . COLONOSCOPY WITH PROPOFOL N/A 07/24/2019   Procedure: COLONOSCOPY WITH PROPOFOL;  Surgeon: Pasty Spillers, MD;  Location: [facility];  Service: Endoscopy;  Laterality: N/A;  . ENDOMETRIAL ABLATION  07/31/2016  . ESOPHAGOGASTRODUODENOSCOPY (EGD) WITH PROPOFOL N/A 07/24/2019   Procedure: ESOPHAGOGASTRODUODENOSCOPY (EGD) WITH PROPOFOL;  Surgeon: Pasty Spillers, MD;  Location: [facility];  Service: Endoscopy;  Laterality: N/A;  . HYSTEROSCOPY  07/31/2016   Procedure: HYSTEROSCOPY WITH HYDROTHERMAL ABLATION;  Surgeon: Hosston Bing, MD;  Location: [facility];  Service: Gynecology;;    Prior to Admission medications   Medication Sig Start Date End Date Taking? Authorizing Provider  albuterol (PROVENTIL HFA;VENTOLIN HFA) 108 (90 Base) MCG/ACT inhaler Inhale 2 puffs into the lungs every 6 (six) hours as needed for wheezing or shortness of breath.    [provider]  amLODipine (NORVASC) 2.5 MG tablet Take 2.5 mg by mouth daily. 01/08/18   [provider]  Ascorbic Acid (VITAMIN C PO) Take by mouth daily.    [provider]  gabapentin (NEURONTIN) 100 MG capsule Take 1 capsule by mouth 1 day or 1 dose. 11/12/19   [provider]  magnesium oxide (MAG-OX) 400 MG tablet Take 400 mg by mouth 2 (two) times daily.    [provider]  Multiple Vitamins-Minerals (WOMENS DAILY  FORMULA PO) Take 1 tablet by mouth daily.    [provider]  omeprazole (PRILOSEC) 40 MG capsule Take 1 capsule (40 mg total) by mouth 2 (two) times daily. 07/20/20 08/19/20  Dartha Lodge, PA-C  ondansetron (ZOFRAN ODT) 4 MG disintegrating tablet 4mg  ODT q4 hours prn nausea/vomit 07/20/20   13/10/21, PA-C  ondansetron (ZOFRAN) 4 MG tablet Take 1 tablet (4 mg total) by mouth every 8 (eight) hours as needed for up to 10 doses for nausea or vomiting. 10/08/20   10/10/20, MD  oxyCODONE-acetaminophen (PERCOCET) 5-325 MG tablet Take  1 tablet by mouth every 8 (eight) hours as needed for up to 5 days for severe pain. 10/08/20 10/13/20  12/11/20, MD  polyethylene glycol [facility]) packet Take 17 g by mouth daily as needed for mild constipation.     [provider]  Potassium Chloride ER 20 MEQ TBCR Take 1 tablet by mouth daily. 11/12/19   [provider]  sucralfate (CARAFATE) 1 g tablet Take 1 tablet (1 g total) by mouth 4 (four) times daily -  with meals and at bedtime. 07/20/20 08/19/20  14/10/21, PA-C     Allergies Lisinopril, Aspirin, Ibuprofen, and Other  Family History  Problem Relation Age of Onset  . Hypertension Mother   . Colon cancer Father 90  . Stroke Brother     Social History Social History   Tobacco Use  . Smoking status: Current Every Day Smoker    Packs/day: 0.25    Types: Cigarettes  . Smokeless tobacco: Never Used  . Tobacco comment: 1 black and mild/day  Vaping Use  . Vaping Use: Never used  Substance Use Topics  . Alcohol use: Yes    Comment: occasionally  . Drug use: Yes    Types: Marijuana    Comment: 10/25/2019    Review of Systems  Constitutional: No fever/chills Eyes: No visual changes.  ENT: No sore throat. Cardiovascular: Denies chest pain. Respiratory: Denies shortness of breath. Gastrointestinal: As above Genitourinary: As above Musculoskeletal: Negative for back pain. Skin: Negative for rash. Neurological: Negative for headaches or weakness   ____________________________________________   PHYSICAL EXAM:  VITAL SIGNS: ED Triage Vitals  Enc Vitals Group     BP 10/11/20 0417 126/82     Pulse Rate 10/11/20 0417 76     Resp 10/11/20 0417 20     Temp 10/11/20 0417 98.9 F (37.2 C)     Temp Source 10/11/20 0417 Oral     SpO2 10/11/20 0417 100 %     Weight 10/11/20 0418 54 kg (119 lb)     Height 10/11/20 0418 1.575 m (5\' 2" )     Head Circumference --      Peak Flow --      Pain Score 10/11/20 0425 10     Pain Loc --       Pain Edu? --      Excl. in GC? --     Constitutional: Alert and oriented.   Nose: No congestion/rhinnorhea. Mouth/Throat: Mucous membranes are moist.   Neck:  Painless ROM Cardiovascular: Normal rate, regular rhythm. Grossly normal heart sounds.  Good peripheral circulation. Respiratory: Normal respiratory effort.  No retractions. Lungs CTAB. Gastrointestinal: Tenderness in the lower abdomen, no rigidity. No distention.  No CVA tenderness.  Musculoskeletal: No lower extremity tenderness nor edema.  Warm and well perfused Neurologic:  Normal speech and language. No gross focal neurologic deficits are appreciated.  Skin:  Skin is warm, dry and intact. No rash noted. Psychiatric: Mood and affect are normal. Speech and behavior are normal.  ____________________________________________   LABS (all labs ordered are listed, but only abnormal results are displayed)  Labs Reviewed  WET PREP, GENITAL - Abnormal; Notable for the following components:      Result Value   WBC, Wet Prep HPF POC RARE (*)    All other components within normal limits  CBC WITH DIFFERENTIAL/PLATELET - Abnormal; Notable for the following components:   RBC 3.41 (*)    Hemoglobin 11.1 (*)    HCT 33.4 (*)    All other components within normal limits  URINALYSIS, COMPLETE (UACMP) WITH MICROSCOPIC - Abnormal; Notable for the following components:   Color, Urine YELLOW (*)    APPearance CLOUDY (*)    Hgb urine dipstick SMALL (*)    Bacteria, UA FEW (*)    All other components within normal limits  CHLAMYDIA/NGC RT PCR ([facility] ONLY)  SARS CORONAVIRUS 2 BY RT PCR (HOSPITAL ORDER, PERFORMED IN [facility])  COMPREHENSIVE METABOLIC PANEL  LIPASE, BLOOD  URINE DRUG SCREEN, QUALITATIVE ([facility] ONLY)  PREGNANCY, URINE  TYPE AND SCREEN   ____________________________________________  EKG  None ____________________________________________  RADIOLOGY  Reviewed CT scan from 2 days  ago ____________________________________________   PROCEDURES  Procedure(s) performed: No  Procedures   Critical Care performed: No ____________________________________________   INITIAL IMPRESSION / ASSESSMENT AND PLAN / ED COURSE  Pertinent labs & imaging results that were available during my care of the patient were reviewed by me and considered in my medical decision making (see chart for details).  Patient presents with lower abdominal pain, the same as what brought her in 2 days ago.  I have reviewed the medical records, patient had CT scan and lab work done 2 days ago.  Lab work was unremarkable.  CT scan demonstrated sizable fluid collection in the endometrium of the uterus:  "Interval increase in size of an oval collection of fluid distending the endometrium in the mid and upper uterus, currently measuring 4.2 x 3.3 x 3.9 cm. This previously measured 2.5 x 3.5 x 3.2 cm. 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  Patient had good improvement after morphine and Zofran  Have consulted Dr. Dalbert Garnet of OB/GYN, she will see the patient in the ED  Dr. Dalbert Garnet has opted to take the patient to the operating room, Covid swab pending    ____________________________________________   FINAL CLINICAL IMPRESSION(S) / ED DIAGNOSES  Final diagnoses:  Hydrometrocolpos        Note:  This document was prepared using Dragon voice recognition software and may include unintentional dictation errors.   Jene Every, MD 10/11/20 [phone]

## 2020-10-12 ENCOUNTER — Encounter: Payer: Self-pay | Admitting: Obstetrics and Gynecology

## 2020-10-13 ENCOUNTER — Encounter: Payer: Self-pay | Admitting: Obstetrics and Gynecology

## 2020-10-13 LAB — SURGICAL PATHOLOGY

## 2020-10-14 ENCOUNTER — Other Ambulatory Visit: Payer: Self-pay

## 2020-10-14 ENCOUNTER — Other Ambulatory Visit: Payer: Self-pay | Admitting: Obstetrics and Gynecology

## 2020-10-14 ENCOUNTER — Other Ambulatory Visit
Admission: RE | Admit: 2020-10-14 | Discharge: 2020-10-14 | Disposition: A | Discharge status: Home / Self Care | Payer: BC Managed Care – PPO | Source: Ambulatory Visit | Attending: Obstetrics and Gynecology | Admitting: Obstetrics and Gynecology

## 2020-10-14 DIAGNOSIS — Z01812 Encounter for preprocedural laboratory examination: Secondary | ICD-10-CM | POA: Insufficient documentation

## 2020-10-14 DIAGNOSIS — G8918 Other acute postprocedural pain: Secondary | ICD-10-CM | POA: Diagnosis not present

## 2020-10-14 DIAGNOSIS — R102 Pelvic and perineal pain: Secondary | ICD-10-CM | POA: Diagnosis not present

## 2020-10-14 DIAGNOSIS — Z20822 Contact with and (suspected) exposure to covid-19: Secondary | ICD-10-CM | POA: Insufficient documentation

## 2020-10-14 NOTE — H&P (Addendum)
History of Present Illness:  Patient Name: Shirley [name] Patient MRN: [id number]  CC: pelvic pain "abd pain as bad as labor contractions"  HPI: Shirley [name] is a 45 y.o. [id number] with hx of C/s X4, with a month of worsening abdominal pain, now with 10/10 pain when it gets worse. Sent home several days ago with same pain that improved with iv narcotics, but has re-arisen.  I took her to the OR on 10/11/20 for suspected hematometria from prior ablation, but was unable to view the endometrial cavity entirely, and no blood was evacuated.  She presents with 10/10 pain again today  CT scan noted enlarging complex fluid collection in uterus. She doesn't remember having her tubes tied.  Hx of stomach ulcer, avoids NSAIDs po   CT ABDOMEN PELVIS W CONTRAST  Result Date: 10/08/2020 CLINICAL DATA: Abdominal distension and cramping lower abdominal pain for several days. Last bowel movement 2 days ago. Nausea and vomiting over the past 3 days. Previous endometrial ablation on 07/31/2016.    EXAM: CT ABDOMEN AND PELVIS WITH CONTRAST TECHNIQUE: Multidetector CT imaging of the abdomen and pelvis was performed using the standard protocol following bolus administration of intravenous contrast. CONTRAST: OMNIPAQUE IOHEXOL 300 MG/ML SOLN COMPARISON: 07/17/2020. Pelvic ultrasound dated 07/09/2019. FINDINGS: Lower chest: Unremarkable. Hepatobiliary: No focal liver abnormality is seen. No gallstones, gallbladder wall thickening, or biliary dilatation. Pancreas: Unremarkable. No pancreatic ductal dilatation or surrounding inflammatory changes. Spleen: Normal in size without focal abnormality. Adrenals/Urinary Tract: Adrenal glands are unremarkable. Kidneys are normal, without renal calculi, focal lesion, or hydronephrosis. Bladder is unremarkable. Stomach/Bowel: Stomach is within normal limits. Appendix appears normal. No evidence of bowel wall thickening, distention, or inflammatory changes.  Vascular/Lymphatic: No significant vascular findings are present. No enlarged abdominal or pelvic lymph nodes.   Reproductive: There has been an increase in size of an oval collection of fluid distending the endometrium in the mid and upper uterus. On sagittal image number 52, this currently measures 4.2 x 3.3 cm and 3.9 cm in width on axial image number 57 series 2. This previously measured 2.5 x 3.5 x 3.2 cm in corresponding dimensions on 07/17/2020. This was not present on 07/09/2019. There are some mildly thickened peripheral enhancing membranes within this superiorly. Other: No abdominal wall hernia or abnormality. No abdominopelvic ascites. Musculoskeletal: Unremarkable bones.   IMPRESSION: 1. Interval increase in size of an oval collection of fluid distending the endometrium in the mid and upper uterus, currently measuring 4.2 x 3.3 x 3.9 cm. This previously measured 2.5 x 3.5 x 3.2 cm. 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 The possibility of infected fluid needs to be excluded clinically. There is no CT visible fetal pole to indicate a pregnancy. This could be better excluded with a pregnancy test. A cystic neoplasm is unlikely but not excluded.   Past Medical History:  has a past medical history of Abnormal uterine bleeding (AUB), Dysphagia, Hiatal hernia, Hypertension, Iron deficiency anemia, Menometrorrhagia, Pyelonephritis, and Stomach ulcer.  Past Surgical History:  has a past surgical history that includes Cesarean section; Endometrial ablation (2017); repair hiatal hernia; and Dilation and curettage of uterus (2021). Family History: Family history is unknown by patient. Social History:  reports that she has never smoked. She has never used smokeless tobacco. She reports current alcohol use. She reports previous drug use. OB/GYN History:          OB History    Shirley [name]  5    Para  4   Term  4   Preterm      AB  1   Living  4     SAB      IAB      Ectopic      Molar      Multiple      Live Births            Allergies: is allergic to aspirin, ibuprofen, and lisinopril. Medications: Current Outpatient Medications:  .  acetaminophen (TYLENOL) 500 MG tablet, Take by mouth, Disp: , Rfl:  .  albuterol 90 mcg/actuation inhaler, Inhale into the lungs, Disp: , Rfl:  .  docusate (COLACE) 100 MG capsule, Take by mouth, Disp: , Rfl:  .  gabapentin (NEURONTIN) 800 MG tablet, TAKE 1 TABLET BY MOUTH AT BEDTIME FOR 3 DAYS, AND THEN TAKE 1 NIGHTLY AT BEDTIME AS NEEDED FOR UP TO 14 DAYS, Disp: , Rfl:  .  magnesium oxide (MAG-OX) 400 mg (241.3 mg magnesium) tablet, Take 400 mg by mouth 2 (two) times daily, Disp: , Rfl:  .  ondansetron (ZOFRAN-ODT) 4 MG disintegrating tablet, DISSOLVE 1 TABLET IN MOUTH EVERY 8 HOURS AS NEEDED FOR NAUSEA AND VOMITING, Disp: , Rfl:  .  oxyCODONE (ROXICODONE) 5 MG immediate release tablet, TAKE 1 TABLET BY MOUTH EVERY 4 HOURS AS NEEDED FOR SEVERE PAIN, Disp: , Rfl:  .  pantoprazole (PROTONIX) 20 MG DR tablet, Take 20 mg by mouth once daily, Disp: , Rfl:  .  sucralfate (CARAFATE) 1 gram tablet, Take by mouth, Disp: , Rfl:  .  amLODIPine (NORVASC) 2.5 MG tablet, , Disp: , Rfl:  .  dicyclomine (BENTYL) 10 mg capsule, TAKE 1 CAPSULE BY MOUTH THREE TIMES DAILY AS NEEDEDV FOR ABDOMINAL CRAMPS (Patient not taking: Reported on 10/14/2020), Disp: , Rfl:  .  omeprazole (PRILOSEC) 40 MG DR capsule, Take 40 mg by mouth 2 (two) times daily (Patient not taking: Reported on 10/14/2020  ), Disp: , Rfl:  .  oxyCODONE-acetaminophen (PERCOCET) 5-325 mg tablet, Take 1-2 tablets by mouth every 6 (six) hours as needed for Pain for up to 5 days, Disp: 30 tablet, Rfl: 0  Review of Systems: No SOB, no palpitations or chest pain, no new lower extremity edema, no nausea or vomiting or bowel or bladder complaints. See HPI for gyn specific ROS.   Exam:    BP 116/82   Ht 157.5 cm (5\' 2" )   Wt 59.1 kg (130 lb 6.4 oz)   BMI 23.85 kg/m   General: Patient is well-groomed, well-nourished, appears stated age in no acute distress  HEENT: head is atraumatic and normocephalic, trachea is midline, neck is supple with no palpable nodules  CV: Regular rhythm and normal heart rate, no murmur  Pulm: Clear to auscultation throughout lung fields with no wheezing, crackles, or rhonchi. No increased work of breathing  Abdomen: soft , tenderness at uterus, no peritoneal signs  Pelvic: deferred. Her uterus is significantly adherent to the abdominal wall.  Impression:   The primary encounter diagnosis was Post-op pain. A diagnosis of Acute pelvic pain, female was also pertinent to this visit.  Plan:   1.  -Patient presents for a preoperative discussion regarding her plans to proceed with surgical treatment of her acute and worsening pelvic pain with suspected blood in the endometrial caivty by total laparoscopic hysterectomy with bilateral salpingectomy procedure.However, due to her prior 4 c/s, I have recommended she see MIGS. Her pain is so significant  that she prefers to go to the OR as soon as possible, and I have quoted her a 50% chance of TAH. We will drop a scope and see, and plan for bladder backfill. She is aware of the risk of cystoscopy.  We may perform a cystoscopy to evaluate the urinary tract after the procedure, if surgically indicated for uro tract integrity.   ERAS prior to surgery, with iv Toradol   The patient and I discussed the technical aspects of the procedure including the potential for risks and complications. These include but are not limited to the risk of infection requiring post-operative antibiotics or further procedures. We talked about the risk of injury to adjacent organs including bladder, bowel, ureter, blood vessels or nerves. We talked about the need to convert to an open incision. We talked about  the possible need for blood transfusion. We talked aboutpostop complications such asthromboembolic or cardiopulmonary complications. All of her questions were answered.  Her preoperative exam was completed and the appropriate consents were signed. She is scheduled to undergo this procedure in the near future.  We gave her 60mg  of IM toradol today in the office. I will refill her Percocet and we have talked about possible need for slow titration down after surgery of the narcotic.  We have scheduled for the next available slot, which is 4 days away.  Specific Peri-operative Considerations:  - Consent: obtained today - Labs: CBC, CMP preoperatively - Studies: EKG, CXR preoperatively - Bowel Preparation: None required - Abx:  Ancef 2g - VTE ppx: SCDs perioperatively - Glucose Protocol: n/a - Beta-blockade: n/a  Diagnoses and all orders for this visit:  Post-op pain -     ketorolac (TORADOL) injection 60 mg  Acute pelvic pain, female -     oxyCODONE-acetaminophen (PERCOCET) 5-325 mg tablet; Take 1-2 tablets by mouth every 6 (six) hours as needed for Pain for up to 5 days   Return for Postop check.

## 2020-10-15 LAB — SARS CORONAVIRUS 2 (TAT 6-24 HRS): SARS Coronavirus 2: NEGATIVE

## 2020-10-18 ENCOUNTER — Inpatient Hospital Stay
Admission: RE | Admit: 2020-10-18 | Discharge: 2020-10-24 | DRG: 742 | Disposition: A | Discharge status: Home / Self Care | Payer: BC Managed Care – PPO | Attending: Obstetrics and Gynecology | Admitting: Obstetrics and Gynecology

## 2020-10-18 ENCOUNTER — Other Ambulatory Visit: Payer: Self-pay

## 2020-10-18 ENCOUNTER — Encounter
Admission: RE | Disposition: A | Discharge status: Home / Self Care | Payer: Self-pay | Source: Home / Self Care | Attending: Obstetrics and Gynecology

## 2020-10-18 ENCOUNTER — Ambulatory Visit: Payer: BC Managed Care – PPO

## 2020-10-18 ENCOUNTER — Encounter: Payer: Self-pay | Admitting: Obstetrics and Gynecology

## 2020-10-18 DIAGNOSIS — N801 Endometriosis of ovary: Principal | ICD-10-CM | POA: Diagnosis present

## 2020-10-18 DIAGNOSIS — E875 Hyperkalemia: Secondary | ICD-10-CM | POA: Diagnosis not present

## 2020-10-18 DIAGNOSIS — F17210 Nicotine dependence, cigarettes, uncomplicated: Secondary | ICD-10-CM | POA: Diagnosis not present

## 2020-10-18 DIAGNOSIS — J9601 Acute respiratory failure with hypoxia: Secondary | ICD-10-CM | POA: Diagnosis not present

## 2020-10-18 DIAGNOSIS — D62 Acute posthemorrhagic anemia: Secondary | ICD-10-CM | POA: Diagnosis not present

## 2020-10-18 DIAGNOSIS — J9811 Atelectasis: Secondary | ICD-10-CM | POA: Diagnosis not present

## 2020-10-18 DIAGNOSIS — K219 Gastro-esophageal reflux disease without esophagitis: Secondary | ICD-10-CM | POA: Diagnosis present

## 2020-10-18 DIAGNOSIS — K9189 Other postprocedural complications and disorders of digestive system: Secondary | ICD-10-CM | POA: Diagnosis not present

## 2020-10-18 DIAGNOSIS — N805 Endometriosis of intestine: Secondary | ICD-10-CM | POA: Diagnosis present

## 2020-10-18 DIAGNOSIS — N736 Female pelvic peritoneal adhesions (postinfective): Secondary | ICD-10-CM | POA: Diagnosis present

## 2020-10-18 DIAGNOSIS — Z8711 Personal history of peptic ulcer disease: Secondary | ICD-10-CM | POA: Diagnosis not present

## 2020-10-18 DIAGNOSIS — J45909 Unspecified asthma, uncomplicated: Secondary | ICD-10-CM | POA: Diagnosis present

## 2020-10-18 DIAGNOSIS — I10 Essential (primary) hypertension: Secondary | ICD-10-CM | POA: Diagnosis present

## 2020-10-18 DIAGNOSIS — K567 Ileus, unspecified: Secondary | ICD-10-CM | POA: Diagnosis not present

## 2020-10-18 DIAGNOSIS — Z886 Allergy status to analgesic agent status: Secondary | ICD-10-CM | POA: Diagnosis not present

## 2020-10-18 DIAGNOSIS — K259 Gastric ulcer, unspecified as acute or chronic, without hemorrhage or perforation: Secondary | ICD-10-CM | POA: Diagnosis present

## 2020-10-18 DIAGNOSIS — F419 Anxiety disorder, unspecified: Secondary | ICD-10-CM | POA: Diagnosis present

## 2020-10-18 DIAGNOSIS — G8918 Other acute postprocedural pain: Secondary | ICD-10-CM | POA: Diagnosis present

## 2020-10-18 DIAGNOSIS — R102 Pelvic and perineal pain: Secondary | ICD-10-CM | POA: Diagnosis present

## 2020-10-18 DIAGNOSIS — R0902 Hypoxemia: Secondary | ICD-10-CM | POA: Diagnosis not present

## 2020-10-18 DIAGNOSIS — N809 Endometriosis, unspecified: Secondary | ICD-10-CM

## 2020-10-18 DIAGNOSIS — M5489 Other dorsalgia: Secondary | ICD-10-CM | POA: Diagnosis not present

## 2020-10-18 DIAGNOSIS — I951 Orthostatic hypotension: Secondary | ICD-10-CM | POA: Diagnosis not present

## 2020-10-18 DIAGNOSIS — R109 Unspecified abdominal pain: Secondary | ICD-10-CM | POA: Diagnosis not present

## 2020-10-18 DIAGNOSIS — K253 Acute gastric ulcer without hemorrhage or perforation: Secondary | ICD-10-CM | POA: Diagnosis present

## 2020-10-18 DIAGNOSIS — N939 Abnormal uterine and vaginal bleeding, unspecified: Secondary | ICD-10-CM | POA: Diagnosis present

## 2020-10-18 HISTORY — PX: HYSTERECTOMY ABDOMINAL WITH SALPINGECTOMY: SHX6725

## 2020-10-18 LAB — CBC
HCT: 22.7 % — ABNORMAL LOW (ref 36.0–46.0)
Hemoglobin: 7.5 g/dL — ABNORMAL LOW (ref 12.0–15.0)
MCH: 33 pg (ref 26.0–34.0)
MCHC: 33 g/dL (ref 30.0–36.0)
MCV: 100 fL (ref 80.0–100.0)
Platelets: 289 10*3/uL (ref 150–400)
RBC: 2.27 MIL/uL — ABNORMAL LOW (ref 3.87–5.11)
RDW: 14.5 % (ref 11.5–15.5)
WBC: 9.9 10*3/uL (ref 4.0–10.5)
nRBC: 0 % (ref 0.0–0.2)

## 2020-10-18 LAB — URINE DRUG SCREEN, QUALITATIVE (ARMC ONLY)
Amphetamines, Ur Screen: NOT DETECTED
Barbiturates, Ur Screen: NOT DETECTED
Benzodiazepine, Ur Scrn: NOT DETECTED
Cannabinoid 50 Ng, Ur ~~LOC~~: POSITIVE — AB
Cocaine Metabolite,Ur ~~LOC~~: NOT DETECTED
MDMA (Ecstasy)Ur Screen: NOT DETECTED
Methadone Scn, Ur: NOT DETECTED
Opiate, Ur Screen: NOT DETECTED
Phencyclidine (PCP) Ur S: NOT DETECTED
Tricyclic, Ur Screen: NOT DETECTED

## 2020-10-18 LAB — BASIC METABOLIC PANEL
Anion gap: 7 (ref 5–15)
BUN: 22 mg/dL — ABNORMAL HIGH (ref 6–20)
CO2: 21 mmol/L — ABNORMAL LOW (ref 22–32)
Calcium: 8.3 mg/dL — ABNORMAL LOW (ref 8.9–10.3)
Chloride: 104 mmol/L (ref 98–111)
Creatinine, Ser: 0.97 mg/dL (ref 0.44–1.00)
GFR, Estimated: >60 mL/min (ref >60)
Glucose, Bld: 132 mg/dL — ABNORMAL HIGH (ref 70–99)
Potassium: 5.5 mmol/L — ABNORMAL HIGH (ref 3.5–5.1)
Sodium: 132 mmol/L — ABNORMAL LOW (ref 135–145)

## 2020-10-18 LAB — CREATININE, SERUM
Creatinine, Ser: 0.64 mg/dL (ref 0.44–1.00)
GFR, Estimated: >60 mL/min (ref >60)

## 2020-10-18 LAB — POCT PREGNANCY, URINE: Preg Test, Ur: NEGATIVE

## 2020-10-18 SURGERY — HYSTERECTOMY, TOTAL, ABDOMINAL, WITH SALPINGECTOMY
Anesthesia: General | Laterality: Bilateral

## 2020-10-18 MED ORDER — OXYCODONE HCL 5 MG PO TABS: 5.0000 mg | ORAL_TABLET | Freq: Once | ORAL | Status: DC | PRN

## 2020-10-18 MED ORDER — PROPOFOL 10 MG/ML IV BOLUS
INTRAVENOUS | Status: DC | PRN
  Administered 2020-10-18: 110 mg via INTRAVENOUS

## 2020-10-18 MED ORDER — LACTATED RINGERS IV SOLN: INTRAVENOUS | Status: DC

## 2020-10-18 MED ORDER — OXYCODONE HCL 5 MG/5ML PO SOLN
5.0000 mg | Freq: Once | ORAL | Status: DC | PRN
Start: 2020-10-18 — End: 2020-10-18

## 2020-10-18 MED ORDER — POLYETHYLENE GLYCOL 3350 17 G PO PACK
17.0000 g | PACK | Freq: Every day | ORAL | Status: DC | PRN
  Administered 2020-10-22 – 2020-10-24 (×3): 17 g via ORAL
  Filled 2020-10-18 (×8): qty 1

## 2020-10-18 MED ORDER — ONDANSETRON HCL 4 MG/2ML IJ SOLN
4.0000 mg | Freq: Four times a day (QID) | INTRAMUSCULAR | Status: DC | PRN
  Administered 2020-10-18 – 2020-10-20 (×2): 4 mg via INTRAVENOUS
  Filled 2020-10-18 (×2): qty 2

## 2020-10-18 MED ORDER — POTASSIUM CHLORIDE 20 MEQ PO PACK
20.0000 meq | PACK | Freq: Every day | ORAL | Status: DC
  Administered 2020-10-18 – 2020-10-24 (×5): 20 meq via ORAL
  Filled 2020-10-18 (×8): qty 1

## 2020-10-18 MED ORDER — ONDANSETRON HCL 4 MG/2ML IJ SOLN
INTRAMUSCULAR | Status: AC
  Filled 2020-10-18: qty 2

## 2020-10-18 MED ORDER — ONDANSETRON HCL 4 MG/2ML IJ SOLN
INTRAMUSCULAR | Status: DC | PRN
  Administered 2020-10-18 (×2): 4 mg via INTRAVENOUS

## 2020-10-18 MED ORDER — FENTANYL CITRATE (PF) 100 MCG/2ML IJ SOLN
INTRAMUSCULAR | Status: AC
  Filled 2020-10-18: qty 2

## 2020-10-18 MED ORDER — LIDOCAINE HCL (CARDIAC) PF 100 MG/5ML IV SOSY
PREFILLED_SYRINGE | INTRAVENOUS | Status: DC | PRN
  Administered 2020-10-18: 60 mg via INTRAVENOUS

## 2020-10-18 MED ORDER — BUPIVACAINE HCL (PF) 0.5 % IJ SOLN
INTRAMUSCULAR | Status: AC
  Filled 2020-10-18: qty 30

## 2020-10-18 MED ORDER — ACETAMINOPHEN 500 MG PO TABS: 1000.0000 mg | ORAL_TABLET | ORAL | Status: AC

## 2020-10-18 MED ORDER — KETAMINE HCL 10 MG/ML IJ SOLN
INTRAMUSCULAR | Status: DC | PRN
  Administered 2020-10-18: 10 mg via INTRAVENOUS
  Administered 2020-10-18: 20 mg via INTRAVENOUS

## 2020-10-18 MED ORDER — ROCURONIUM BROMIDE 10 MG/ML (PF) SYRINGE
PREFILLED_SYRINGE | INTRAVENOUS | Status: AC
  Filled 2020-10-18: qty 10

## 2020-10-18 MED ORDER — MIDAZOLAM HCL 2 MG/2ML IJ SOLN
INTRAMUSCULAR | Status: AC
  Filled 2020-10-18: qty 2

## 2020-10-18 MED ORDER — CEFAZOLIN SODIUM-DEXTROSE 2-4 GM/100ML-% IV SOLN
2.0000 g | INTRAVENOUS | Status: AC
  Administered 2020-10-18: 2 g via INTRAVENOUS

## 2020-10-18 MED ORDER — DICYCLOMINE HCL 10 MG PO CAPS
10.0000 mg | ORAL_CAPSULE | Freq: Three times a day (TID) | ORAL | Status: DC | PRN
  Filled 2020-10-18: qty 1

## 2020-10-18 MED ORDER — DEXMEDETOMIDINE (PRECEDEX) IN NS 20 MCG/5ML (4 MCG/ML) IV SYRINGE
PREFILLED_SYRINGE | INTRAVENOUS | Status: DC | PRN
  Administered 2020-10-18 (×2): 8 ug via INTRAVENOUS
  Administered 2020-10-18 (×2): 12 ug via INTRAVENOUS

## 2020-10-18 MED ORDER — CHLORHEXIDINE GLUCONATE 0.12 % MT SOLN: 15.0000 mL | Freq: Once | OROMUCOSAL | Status: AC

## 2020-10-18 MED ORDER — DIPHENHYDRAMINE HCL 50 MG/ML IJ SOLN: 12.5000 mg | Freq: Four times a day (QID) | INTRAMUSCULAR | Status: DC | PRN

## 2020-10-18 MED ORDER — ALBUTEROL SULFATE HFA 108 (90 BASE) MCG/ACT IN AERS
2.0000 | INHALATION_SPRAY | Freq: Four times a day (QID) | RESPIRATORY_TRACT | Status: DC | PRN
  Filled 2020-10-18: qty 6.7

## 2020-10-18 MED ORDER — SUCCINYLCHOLINE CHLORIDE 200 MG/10ML IV SOSY
PREFILLED_SYRINGE | INTRAVENOUS | Status: AC
  Filled 2020-10-18: qty 10

## 2020-10-18 MED ORDER — KETOROLAC TROMETHAMINE 30 MG/ML IJ SOLN
30.0000 mg | Freq: Once | INTRAMUSCULAR | Status: AC
  Administered 2020-10-18: 30 mg via INTRAVENOUS
  Filled 2020-10-18: qty 1

## 2020-10-18 MED ORDER — HYDROMORPHONE 1 MG/ML IV SOLN
INTRAVENOUS | Status: DC
Start: 2020-10-18 — End: 2020-10-18

## 2020-10-18 MED ORDER — DIPHENHYDRAMINE HCL 12.5 MG/5ML PO ELIX
12.5000 mg | ORAL_SOLUTION | Freq: Four times a day (QID) | ORAL | Status: DC | PRN
  Filled 2020-10-18 (×2): qty 5

## 2020-10-18 MED ORDER — SIMETHICONE 80 MG PO CHEW
80.0000 mg | CHEWABLE_TABLET | Freq: Four times a day (QID) | ORAL | Status: DC | PRN
  Administered 2020-10-18 – 2020-10-21 (×3): 80 mg via ORAL
  Filled 2020-10-18 (×3): qty 1

## 2020-10-18 MED ORDER — SODIUM CHLORIDE 0.9 % IV SOLN
INTRAVENOUS | Status: DC | PRN
  Administered 2020-10-18: 40 mL

## 2020-10-18 MED ORDER — ROCURONIUM BROMIDE 10 MG/ML (PF) SYRINGE
PREFILLED_SYRINGE | INTRAVENOUS | Status: AC
  Filled 2020-10-18: qty 20

## 2020-10-18 MED ORDER — HYDROMORPHONE HCL 1 MG/ML IJ SOLN
INTRAMUSCULAR | Status: AC
  Administered 2020-10-18: 0.5 mg via INTRAVENOUS
  Filled 2020-10-18: qty 1

## 2020-10-18 MED ORDER — GABAPENTIN 300 MG PO CAPS: 300.0000 mg | ORAL_CAPSULE | ORAL | Status: AC

## 2020-10-18 MED ORDER — PHENYLEPHRINE HCL (PRESSORS) 10 MG/ML IV SOLN
INTRAVENOUS | Status: DC | PRN
  Administered 2020-10-18 (×3): 100 ug via INTRAVENOUS

## 2020-10-18 MED ORDER — SODIUM CHLORIDE 0.9% FLUSH: 9.0000 mL | INTRAVENOUS | Status: DC | PRN

## 2020-10-18 MED ORDER — NALOXONE HCL 0.4 MG/ML IJ SOLN: 0.4000 mg | INTRAMUSCULAR | Status: DC | PRN

## 2020-10-18 MED ORDER — SODIUM CHLORIDE 0.9 % IV SOLN
INTRAVENOUS | Status: DC | PRN
  Administered 2020-10-18: 10 ug/min via INTRAVENOUS

## 2020-10-18 MED ORDER — DEXAMETHASONE SODIUM PHOSPHATE 10 MG/ML IJ SOLN
INTRAMUSCULAR | Status: DC | PRN
  Administered 2020-10-18: 10 mg via INTRAVENOUS

## 2020-10-18 MED ORDER — ALBUMIN HUMAN 5 % IV SOLN
INTRAVENOUS | Status: AC
  Filled 2020-10-18: qty 500

## 2020-10-18 MED ORDER — KETAMINE HCL 50 MG/5ML IJ SOSY
PREFILLED_SYRINGE | INTRAMUSCULAR | Status: AC
  Filled 2020-10-18: qty 5

## 2020-10-18 MED ORDER — FENTANYL CITRATE (PF) 100 MCG/2ML IJ SOLN
INTRAMUSCULAR | Status: DC | PRN
  Administered 2020-10-18: 25 ug via INTRAVENOUS
  Administered 2020-10-18 (×2): 50 ug via INTRAVENOUS
  Administered 2020-10-18 (×3): 25 ug via INTRAVENOUS

## 2020-10-18 MED ORDER — OXYCODONE HCL 5 MG PO TABS
5.0000 mg | ORAL_TABLET | ORAL | Status: DC | PRN
  Administered 2020-10-19 – 2020-10-20 (×3): 5 mg via ORAL
  Filled 2020-10-18 (×3): qty 1

## 2020-10-18 MED ORDER — SUCRALFATE 1 G PO TABS
1.0000 g | ORAL_TABLET | Freq: Three times a day (TID) | ORAL | Status: DC
  Administered 2020-10-18 – 2020-10-24 (×18): 1 g via ORAL
  Filled 2020-10-18 (×26): qty 1

## 2020-10-18 MED ORDER — ORAL CARE MOUTH RINSE: 15.0000 mL | Freq: Once | OROMUCOSAL | Status: AC

## 2020-10-18 MED ORDER — BUPIVACAINE HCL 0.5 % IJ SOLN
INTRAMUSCULAR | Status: DC | PRN
  Administered 2020-10-18: 30 mL

## 2020-10-18 MED ORDER — DEXAMETHASONE SODIUM PHOSPHATE 10 MG/ML IJ SOLN
INTRAMUSCULAR | Status: AC
  Filled 2020-10-18: qty 1

## 2020-10-18 MED ORDER — GABAPENTIN 300 MG PO CAPS
ORAL_CAPSULE | ORAL | Status: AC
  Administered 2020-10-18: 300 mg via ORAL
  Filled 2020-10-18: qty 1

## 2020-10-18 MED ORDER — PANTOPRAZOLE SODIUM 40 MG PO TBEC
40.0000 mg | DELAYED_RELEASE_TABLET | Freq: Two times a day (BID) | ORAL | Status: DC
Start: 2020-10-18 — End: 2020-10-20
  Administered 2020-10-18 – 2020-10-20 (×5): 40 mg via ORAL
  Filled 2020-10-18 (×5): qty 1

## 2020-10-18 MED ORDER — HYDROMORPHONE 1 MG/ML IV SOLN
INTRAVENOUS | Status: DC
  Filled 2020-10-18 (×2): qty 30

## 2020-10-18 MED ORDER — KETOROLAC TROMETHAMINE 15 MG/ML IJ SOLN
INTRAMUSCULAR | Status: AC
  Administered 2020-10-18: 15 mg via INTRAVENOUS
  Filled 2020-10-18: qty 1

## 2020-10-18 MED ORDER — SODIUM CHLORIDE FLUSH 0.9 % IV SOLN
INTRAVENOUS | Status: AC
  Filled 2020-10-18: qty 20

## 2020-10-18 MED ORDER — HYDROMORPHONE HCL 1 MG/ML IJ SOLN
INTRAMUSCULAR | Status: DC | PRN
  Administered 2020-10-18 (×2): .5 mg via INTRAVENOUS

## 2020-10-18 MED ORDER — ALBUMIN HUMAN 5 % IV SOLN: INTRAVENOUS | Status: DC | PRN

## 2020-10-18 MED ORDER — ONDANSETRON HCL 4 MG/2ML IJ SOLN: 4.0000 mg | Freq: Four times a day (QID) | INTRAMUSCULAR | Status: DC | PRN

## 2020-10-18 MED ORDER — METHYLENE BLUE 0.5 % INJ SOLN
INTRAVENOUS | Status: AC
  Filled 2020-10-18: qty 10

## 2020-10-18 MED ORDER — HYDROMORPHONE 1 MG/ML IV SOLN: INTRAVENOUS | Status: DC

## 2020-10-18 MED ORDER — HYDROMORPHONE HCL 1 MG/ML IJ SOLN
0.5000 mg | INTRAMUSCULAR | Status: DC | PRN
  Administered 2020-10-18: 0.5 mg via INTRAVENOUS

## 2020-10-18 MED ORDER — MIDAZOLAM HCL 2 MG/2ML IJ SOLN
INTRAMUSCULAR | Status: DC | PRN
  Administered 2020-10-18: 2 mg via INTRAVENOUS

## 2020-10-18 MED ORDER — ONDANSETRON HCL 4 MG/2ML IJ SOLN: 4.0000 mg | Freq: Once | INTRAMUSCULAR | Status: DC | PRN

## 2020-10-18 MED ORDER — MENTHOL 3 MG MT LOZG
1.0000 | LOZENGE | OROMUCOSAL | Status: DC | PRN
  Filled 2020-10-18: qty 9

## 2020-10-18 MED ORDER — FENTANYL CITRATE (PF) 100 MCG/2ML IJ SOLN
25.0000 ug | INTRAMUSCULAR | Status: DC | PRN
  Administered 2020-10-18: 50 ug via INTRAVENOUS

## 2020-10-18 MED ORDER — FENTANYL CITRATE (PF) 100 MCG/2ML IJ SOLN
INTRAMUSCULAR | Status: AC
  Administered 2020-10-18: 50 ug via INTRAVENOUS
  Filled 2020-10-18: qty 2

## 2020-10-18 MED ORDER — SODIUM CHLORIDE 0.9% FLUSH
9.0000 mL | INTRAVENOUS | Status: DC | PRN
Start: 2020-10-18 — End: 2020-10-18

## 2020-10-18 MED ORDER — POVIDONE-IODINE 10 % EX SWAB: 2.0000 "application " | Freq: Once | CUTANEOUS | Status: DC

## 2020-10-18 MED ORDER — ROCURONIUM BROMIDE 100 MG/10ML IV SOLN
INTRAVENOUS | Status: DC | PRN
  Administered 2020-10-18 (×2): 20 mg via INTRAVENOUS
  Administered 2020-10-18: 50 mg via INTRAVENOUS
  Administered 2020-10-18: 20 mg via INTRAVENOUS

## 2020-10-18 MED ORDER — GLYCOPYRROLATE 0.2 MG/ML IJ SOLN
INTRAMUSCULAR | Status: DC | PRN
  Administered 2020-10-18: .2 mg via INTRAVENOUS

## 2020-10-18 MED ORDER — DIPHENHYDRAMINE HCL 12.5 MG/5ML PO ELIX
12.5000 mg | ORAL_SOLUTION | Freq: Four times a day (QID) | ORAL | Status: DC | PRN
Start: 2020-10-18 — End: 2020-10-18
  Filled 2020-10-18: qty 5

## 2020-10-18 MED ORDER — LIDOCAINE HCL (PF) 2 % IJ SOLN
INTRAMUSCULAR | Status: AC
  Filled 2020-10-18: qty 5

## 2020-10-18 MED ORDER — CEFAZOLIN SODIUM-DEXTROSE 2-4 GM/100ML-% IV SOLN
INTRAVENOUS | Status: AC
  Filled 2020-10-18: qty 100

## 2020-10-18 MED ORDER — DOCUSATE SODIUM 100 MG PO CAPS
100.0000 mg | ORAL_CAPSULE | Freq: Two times a day (BID) | ORAL | Status: DC
  Administered 2020-10-18 – 2020-10-24 (×12): 100 mg via ORAL
  Filled 2020-10-18 (×12): qty 1

## 2020-10-18 MED ORDER — ACETAMINOPHEN 500 MG PO TABS
ORAL_TABLET | ORAL | Status: AC
  Administered 2020-10-18: 1000 mg via ORAL
  Filled 2020-10-18: qty 2

## 2020-10-18 MED ORDER — ASCORBIC ACID 500 MG PO TABS
500.0000 mg | ORAL_TABLET | Freq: Every day | ORAL | Status: DC
  Administered 2020-10-18 – 2020-10-24 (×6): 500 mg via ORAL
  Filled 2020-10-18 (×6): qty 1

## 2020-10-18 MED ORDER — SUGAMMADEX SODIUM 200 MG/2ML IV SOLN
INTRAVENOUS | Status: DC | PRN
  Administered 2020-10-18: 100 mg via INTRAVENOUS
  Administered 2020-10-18: 200 mg via INTRAVENOUS

## 2020-10-18 MED ORDER — HYDROMORPHONE HCL 1 MG/ML IJ SOLN
INTRAMUSCULAR | Status: AC
  Filled 2020-10-18: qty 1

## 2020-10-18 MED ORDER — BUPIVACAINE LIPOSOME 1.3 % IJ SUSP
INTRAMUSCULAR | Status: AC
  Filled 2020-10-18: qty 20

## 2020-10-18 MED ORDER — CHLORHEXIDINE GLUCONATE 0.12 % MT SOLN
OROMUCOSAL | Status: AC
  Administered 2020-10-18: 15 mL via OROMUCOSAL
  Filled 2020-10-18: qty 15

## 2020-10-18 MED ORDER — HYDROMORPHONE HCL 1 MG/ML IJ SOLN
0.5000 mg | Freq: Once | INTRAMUSCULAR | Status: AC
Start: 2020-10-18 — End: 2020-10-18
  Administered 2020-10-18: 0.5 mg via INTRAVENOUS
  Filled 2020-10-18: qty 1

## 2020-10-18 MED ORDER — KETOROLAC TROMETHAMINE 15 MG/ML IJ SOLN: 15.0000 mg | INTRAMUSCULAR | Status: AC

## 2020-10-18 MED ORDER — DIPHENHYDRAMINE HCL 50 MG/ML IJ SOLN
12.5000 mg | Freq: Four times a day (QID) | INTRAMUSCULAR | Status: DC | PRN
  Administered 2020-10-19: 12.5 mg via INTRAVENOUS
  Filled 2020-10-18: qty 1

## 2020-10-18 MED ORDER — HYDROMORPHONE 1 MG/ML IV SOLN
INTRAVENOUS | Status: DC
  Administered 2020-10-18: 3.1 mL via INTRAVENOUS
  Administered 2020-10-18: 25 mg via INTRAVENOUS
  Administered 2020-10-18: 4.78 mg via INTRAVENOUS
  Administered 2020-10-19: 3.97 mL via INTRAVENOUS
  Administered 2020-10-19: 3.45 mL via INTRAVENOUS
  Administered 2020-10-19: 3.84 mL via INTRAVENOUS
  Filled 2020-10-18: qty 30

## 2020-10-18 MED ORDER — PROPOFOL 10 MG/ML IV BOLUS
INTRAVENOUS | Status: AC
  Filled 2020-10-18: qty 20

## 2020-10-18 SURGICAL SUPPLY — 66 items
ADH SKN CLS APL DERMABOND .7 (GAUZE/BANDAGES/DRESSINGS)
APL PRP STRL LF DISP 70% ISPRP (MISCELLANEOUS) ×4
BAG DRN RND TRDRP ANRFLXCHMBR (UROLOGICAL SUPPLIES) ×4
BAG URINE DRAIN 2000ML AR STRL (UROLOGICAL SUPPLIES) ×6 IMPLANT
BLADE SURG SZ11 CARB STEEL (BLADE) ×3 IMPLANT
BRR ADH 6X5 SEPRAFILM 1 SHT (MISCELLANEOUS) ×2
CATH FOLEY 2WAY  5CC 16FR (CATHETERS) ×3
CATH FOLEY 2WAY 5CC 16FR (CATHETERS) ×2
CATH URTH 16FR FL 2W BLN LF (CATHETERS) ×2 IMPLANT
CHLORAPREP W/TINT 26 (MISCELLANEOUS) ×5 IMPLANT
CNTNR SPEC 2.5X3XGRAD LEK (MISCELLANEOUS) ×2
CONT SPEC 4OZ STER OR WHT (MISCELLANEOUS) ×1
CONT SPEC 4OZ STRL OR WHT (MISCELLANEOUS) ×2
CONTAINER SPEC 2.5X3XGRAD LEK (MISCELLANEOUS) ×1 IMPLANT
COUNTER NEEDLE 20/40 LG (NEEDLE) ×5 IMPLANT
COVER LIGHT HANDLE STERIS (MISCELLANEOUS) ×2 IMPLANT
COVER WAND RF STERILE (DRAPES) ×3 IMPLANT
DERMABOND ADVANCED (GAUZE/BANDAGES/DRESSINGS)
DERMABOND ADVANCED .7 DNX12 (GAUZE/BANDAGES/DRESSINGS) ×1 IMPLANT
DRAPE LAP W/FLUID (DRAPES) ×3 IMPLANT
DRAPE UNDER BUTTOCK W/FLU (DRAPES) ×3 IMPLANT
DRSG TELFA 3X8 NADH (GAUZE/BANDAGES/DRESSINGS) ×3 IMPLANT
ELECT CAUTERY BLADE 6.4 (BLADE) ×3 IMPLANT
ELECT REM PT RETURN 9FT ADLT (ELECTROSURGICAL) ×3
ELECTRODE REM PT RTRN 9FT ADLT (ELECTROSURGICAL) ×2 IMPLANT
GAUZE 4X4 16PLY RFD (DISPOSABLE) ×3 IMPLANT
GAUZE SPONGE 4X4 12PLY STRL (GAUZE/BANDAGES/DRESSINGS) ×3 IMPLANT
GLOVE INDICATOR 7.5 STRL GRN (GLOVE) ×12 IMPLANT
GLOVE SURG ENC MOIS LTX SZ7 (GLOVE) ×13 IMPLANT
GOWN STRL REUS W/ TWL LRG LVL3 (GOWN DISPOSABLE) ×6 IMPLANT
GOWN STRL REUS W/ TWL XL LVL3 (GOWN DISPOSABLE) ×2 IMPLANT
GOWN STRL REUS W/TWL LRG LVL3 (GOWN DISPOSABLE) ×9
GOWN STRL REUS W/TWL XL LVL3 (GOWN DISPOSABLE) ×3
IV LACTATED RINGERS 1000ML (IV SOLUTION) ×1 IMPLANT
KIT PINK PAD W/HEAD ARE REST (MISCELLANEOUS) ×3
KIT PINK PAD W/HEAD ARM REST (MISCELLANEOUS) ×2 IMPLANT
L-HOOK LAP DISP 36CM (ELECTROSURGICAL)
LABEL OR SOLS (LABEL) ×3 IMPLANT
LHOOK LAP DISP 36CM (ELECTROSURGICAL) IMPLANT
MANIFOLD NEPTUNE II (INSTRUMENTS) ×3 IMPLANT
NS IRRIG 500ML POUR BTL (IV SOLUTION) ×3 IMPLANT
PACK BASIN MAJOR ARMC (MISCELLANEOUS) ×3 IMPLANT
PACK GYN LAPAROSCOPIC (MISCELLANEOUS) ×3 IMPLANT
PAD DRESSING TELFA 3X8 NADH (GAUZE/BANDAGES/DRESSINGS) ×1 IMPLANT
PAD OB MATERNITY 4.3X12.25 (PERSONAL CARE ITEMS) ×3 IMPLANT
PAD PREP 24X41 OB/GYN DISP (PERSONAL CARE ITEMS) ×3 IMPLANT
PENCIL ELECTRO HAND CTR (MISCELLANEOUS) IMPLANT
SEPRAFILM MEMBRANE 5X6 (MISCELLANEOUS) ×2 IMPLANT
SET CYSTO W/LG BORE CLAMP LF (SET/KITS/TRAYS/PACK) ×1 IMPLANT
SOL PREP PVP 2OZ (MISCELLANEOUS) ×3
SOLUTION PREP PVP 2OZ (MISCELLANEOUS) ×2 IMPLANT
STAPLER INSORB 30 2030 C-SECTI (MISCELLANEOUS) ×2 IMPLANT
STAPLER SKIN PROX 35W (STAPLE) ×1 IMPLANT
SURGILUBE 2OZ TUBE FLIPTOP (MISCELLANEOUS) ×3 IMPLANT
SUT PDS AB 0 CT1 27 (SUTURE) ×4 IMPLANT
SUT VIC AB 0 CT1 27 (SUTURE) ×12
SUT VIC AB 0 CT1 27XCR 8 STRN (SUTURE) ×7 IMPLANT
SUT VIC AB 0 CT1 36 (SUTURE) ×6 IMPLANT
SUT VIC AB 2-0 SH 27 (SUTURE) ×6
SUT VIC AB 2-0 SH 27XBRD (SUTURE) ×2 IMPLANT
SUT VIC AB 3-0 SH 27 (SUTURE) ×3
SUT VIC AB 3-0 SH 27X BRD (SUTURE) ×1 IMPLANT
SYR 10ML LL (SYRINGE) ×3 IMPLANT
SYR 20ML LL LF (SYRINGE) ×2 IMPLANT
SYR 50ML LL SCALE MARK (SYRINGE) ×3 IMPLANT
SYR BULB IRRIG 60ML STRL (SYRINGE) ×3 IMPLANT

## 2020-10-18 NOTE — Progress Notes (Signed)
Notified by nurse tech that patients abdominal binder was saturated with bright red blood. Upon assessment, gauze, ABD pads and abdominal binder were saturated with blood. Incision from the left side towards the middle was not approximated and oozing blood.  Mcvey, CNM paged and arrived at bedside. Steri-strips applied to incision and pressure dressing placed. Abdominal binder placed over abdominal pillow. Vital signs stable, pt complaining of 8/10 pain while on PCA pump. Encouraged patient to lay flat and get rest. Patient given emotional and physical support.

## 2020-10-18 NOTE — Progress Notes (Signed)
Day of Surgery Procedure(s): HYSTERECTOMY ABDOMINAL WITH SALPINGECTOMY (Bilateral) Subjective: Pain is adequately controlled. No SOB or CP. Resting quietly.   Objective: Vital signs in last 24 hours: Temp:  [97.8 F (36.6 C)-98.7 F (37.1 C)] 98 F (36.7 C) (02/08 1522) Pulse Rate:  [81-105] 105 (02/08 1522) Resp:  [9-20] 18 (02/08 1551) BP: (92-141)/(64-100) 102/72 (02/08 1522) SpO2:  [99 %-100 %] 100 % (02/08 1551) FiO2 (%):  [100 %] 100 % (02/08 1551) Weight:  [54 kg] 54 kg (02/08 0640)  Intake/Output  Intake/Output Summary (Last 24 hours) at 10/18/2020 1730 Last data filed at 10/18/2020 1611 Gross per 24 hour  Intake 1957 ml  Output 900 ml  Net 1057 ml    Physical Exam:  General: Alert and oriented. CV: RRR Lungs: Clear bilaterally. GI: Soft, Nondistended. Incisions: Clean and dry, after pressure dressing replacement x1 Urine: Clear Extremities: Nontender, no erythema, no edema.  Assessment/Plan: POD# 0 s/p Procedure(s): HYSTERECTOMY ABDOMINAL WITH SALPINGECTOMY (Bilateral).  - Encourage Incentive spirometry -Advance diet as tolerated tomorrow, but may do clears overnight -PCA overnight -OOB to chair for 4 hours tomorrow   Christeen Douglas, MD   LOS: 0 days   Christeen Douglas 10/18/2020, 5:30 PM

## 2020-10-18 NOTE — Interval H&P Note (Signed)
History and Physical Interval Note:  10/18/2020 7:26 AM  Shirley [name]  has presented today for surgery, with the diagnosis of PTAS-- pelvic pain.  The various methods of treatment have been discussed with the patient and family. After consideration of risks, benefits and other options for treatment, the patient has consented to  Procedure(s): TOTAL LAPAROSCOPIC HYSTERECTOMY WITH SALPINGECTOMY (Bilateral) HYSTERECTOMY ABDOMINAL WITH SALPINGECTOMY (Bilateral) CYSTOSCOPY, BLADDER BACKFILL (N/A) as a surgical intervention.    This was the planned procedure, but patient requests to start with TAH. We will do.  The patient's history has been reviewed, patient examined, no change in status, stable for surgery.  I have reviewed the patient's chart and labs.  Questions were answered to the patient's satisfaction.     Christeen Douglas

## 2020-10-18 NOTE — Anesthesia Preprocedure Evaluation (Signed)
Anesthesia Evaluation  Patient identified by MRN, date of birth, ID band Patient awake    Reviewed: Allergy & Precautions, H&P , NPO status , Patient's Chart, lab work & pertinent test results  History of Anesthesia Complications Negative for: history of anesthetic complications  Airway Mallampati: III  TM Distance: >3 FB Neck ROM: Full    Dental  (+) Chipped   Pulmonary asthma , Current SmokerPatient did not abstain from smoking.,  Well controlled asthma, not taken inhaler in a month. Never hospitalized   Pulmonary exam normal breath sounds clear to auscultation       Cardiovascular hypertension, + Valvular Problems/Murmurs  Rhythm:Regular Rate:Normal + Diastolic murmurs    Neuro/Psych  Headaches, Seizures -,  negative psych ROS   GI/Hepatic Neg liver ROS, hiatal hernia, PUD, GERD  Medicated and Controlled,  Endo/Other  negative endocrine ROS  Renal/GU   negative genitourinary   Musculoskeletal negative musculoskeletal ROS (+)   Abdominal   Peds negative pediatric ROS (+)  Hematology  (+) Blood dyscrasia, anemia ,   Anesthesia Other Findings Past Medical History: No date: Anemia No date: Asthma No date: Bacteremia No date: Blood dyscrasia     Comment:  has to apply a lot of pressure to stop bleeding No date: GERD (gastroesophageal reflux disease) No date: Heart murmur     Comment:  with pregnancy No date: History of hiatal hernia No date: Hypertension     Comment:  4-5 years ago, no meds No date: Kidney infection     Comment:  patient states I might have a kidney infection No date: Migraine     Comment:  migraines 1-2x/month No date: Pyelonephritis 05/09/2017: Seizure (HCC)     Comment:  due to low potassium No date: Wears dentures     Comment:  partial upper and lower   Reproductive/Obstetrics negative OB ROS                             Anesthesia Physical  Anesthesia  Plan  ASA: II  Anesthesia Plan: General   Post-op Pain Management:    Induction: Intravenous  PONV Risk Score and Plan: 3 and Ondansetron, Dexamethasone and Midazolam  Airway Management Planned: Oral ETT  Additional Equipment: None  Intra-op Plan:   Post-operative Plan: Extubation in OR  Informed Consent: I have reviewed the patients History and Physical, chart, labs and discussed the procedure including the risks, benefits and alternatives for the proposed anesthesia with the patient or authorized representative who has indicated his/her understanding and acceptance.     Dental advisory given  Plan Discussed with: CRNA, Anesthesiologist and Surgeon  Anesthesia Plan Comments: (Discussed risks of anesthesia with patient, including PONV, sore throat, lip/dental damage. Rare risks discussed as well, such as cardiorespiratory and neurological sequelae. Patient understands. Patient counseled on benefits of smoking cessation, and increased perioperative risks associated with continued smoking. )        Anesthesia Quick Evaluation

## 2020-10-18 NOTE — Op Note (Signed)
Shirley [name] PROCEDURE DATE: 10/18/2020  PREOPERATIVE DIAGNOSIS:  Acute pelvic pain, suspected hematometria POSTOPERATIVE DIAGNOSIS:  Same plus significant pelvic adhesive disease  SURGEON:   Christeen Douglas, MD. ASSISTANT: Jennell Corner, MD . ANESTHESIOLOGIST: [name], MD Anesthesiologist: [name], MD CRNA: Mohammed Kindle, CRNA  OPERATION:  Total abdominal hysterectomy, Bilateral Salpingectomy Lysis of adhesions  ANESTHESIA:  General endotracheal.  INDICATIONS: The patient is a 45 y.o. [id number] with the aforementioned diagnoses who desires definitive surgical management. On the preoperative visit, the risks, benefits, indications, and alternatives of the procedure were reviewed with the patient.  On the day of surgery, the risks of surgery were again discussed with the patient including but not limited to: bleeding which may require transfusion or reoperation; infection which may require antibiotics; injury to bowel, bladder, ureters or other surrounding organs; need for additional procedures; thromboembolic phenomenon, incisional problems and other postoperative/anesthesia complications. Written informed consent was obtained.    OPERATIVE FINDINGS: A 6 week size uterus with normal tubes and ovaries bilaterally. Normal cervix.     Endometriosis noted throughout the pelvis, on the bowel and ovaries, without significant adhesions or Allen-Masters windows.  Dense pelvic scarring, with the anterior surface of the uterus entirely adhesed to the fascia.  The bladder was adherent to the cervix, but was easily separated without bladder damage.  The bladder was also adhesed to the underside of the fascia, but no bladder injury was appreciated through the case.  The cervix, and subsequently the vaginal cuff, was attached to the pelvic sidewall by a tight band of adhesion.  This was taken down, allowing for mobility of the vaginal cuff at the conclusion of the case.  This  case was exceptionally difficult due to the obliteration of the pelvic tissue planes and the fascial plane.   ESTIMATED BLOOD LOSS: 100 ml FLUIDS:  700 ml of Lactated Ringers URINE OUTPUT:  700 ml of clear yellow urine. SPECIMENS:  Uterus,cervix,  bilateral fallopian tubes sent to pathology COMPLICATIONS:  None immediate.  DESCRIPTION OF PROCEDURE:  The patient received intravenous antibiotics and had sequential compression devices applied to her lower extremities while in the preoperative area.   She was taken to the operating room and placed under general anesthesia without difficulty.The abdomen and perineum were prepped and draped in a sterile manner, and she was placed in a lithotomy position, with a sponge stick in the vagina.  A Foley catheter was inserted into the bladder and attached to constant drainage.  After an adequate timeout was performed, a Pfannensteil skin incision was made using the prior well-healed scar.. This incision was taken down to the fascia using electrocautery with care given to maintain good hemostasis.  The scarring was significant at the fascial plane, and therefore the fascia was identified and taken open layer by layer using sharp excision.  The fascia was excised laterally first, and then careful injury at the midline was used.  However, while we found the rectus muscles laterally, there was no clear peritoneum in the midline.  Therefore, we dissected off the rectus muscles from the superior fascia, being careful to maintain hemostasis.  An entry port into the peritoneum was made in her left lower quadrant, lateral to the adhesions, and we carefully dissected toward the midline.    We noted that the anterior surface of the uterus was entirely adherent to the underside of the fascia, and we carefully took these layers down bit by bit, being careful to maintain hemostasis and avoid injury to the  bowel.  The fundus of the uterus was free, and once we accessed this plane,  we were able to access the bilateral adnexa.     Attention was then turned to the pelvis.  We were unable to place a retractor due to the scar tissue, and we packed the bowel away with moist sponges.    The round ligaments on each side were clamped, suture ligated with 0 Vicryl, and transected with electrocautery allowing entry into the broad ligament. Of note, all sutures used in this procedure are 0 Vicryl unless otherwise noted. The anterior and posterior leaves of the broad ligament were separated, and her lateral uterus was clear down to the level of the cervix.    Adnexae were clamped on the patient's right side, cut, and doubly suture ligated. This procedure was repeated in an identical fashion on the left site allowing for both adnexa to remain in place.    A bladder flap was then created.  The bladder was then sharply and bluntly dissected off the lower uterine segment and cervix with good hemostasis noted. The uterine arteries were then skeletonized bilaterally and then clamped, cut, and doubly suture ligated with care given to prevent ureteral injury.    The uterosacral ligaments were then clamped, cut, and ligated bilaterally.  Finally, the cardinal ligaments were clamped, cut, and ligated bilaterally.  Acutely curved clamps were placed across the vagina just under the cervix, and the specimen was amputated and sent to pathology. The vaginal cuff angles were closed with Heaney stiches with care given to incorporate the uterosacral-cardinal ligament pedicles on both sides. The middle of the vaginal cuff was closed with a series of interrupted figure-of-eight sutures with care given to incorporate the anterior pubocervical fascia and the posterior rectovaginal fascia.     Kelly clamps were placed on the mesosalpinx of the right fallopian tube, and the fallopian tube was excised.  The pedicle was then secured with a free tie.  A similar process was carried out on the left side, allowing for  bilateral salpingectomy.     The pelvis was irrigated and hemostasis was reconfirmed at all pedicles and along the pelvic sidewall.  The ureters were inspected and noted to be peristalsing bilaterally.  All laparotomy sponges and instruments were removed from the abdomen.   Because of the prior scar tissue at the fascia, the midline rectus muscles were pulled laterally, and a large piece of Seprafilm was placed under them above the bowel.  The peritoneum was closed with a running stitch underneath places where the rectus left a complete whole, to prevent future internal hernias or bowel obstruction and to keep the rectus superior to the peritoneum, and the fascia was also closed in a running fashion with 0 PDS.   Exparel was infused along the fascial and skin lines in standard fashion.  The skin was closed with Ensorb staples. Sponge, lap, needle, and instrument counts were correct times two. The patient was taken to the recovery area awake, extubated and in stable condition.  She will stay overnight, the Foley will remain in place, and she will have a patient controlled anesthesia infuser for the next 24 hours.  She will get Lovenox tomorrow, and Toradol after 12 hours.

## 2020-10-18 NOTE — Anesthesia Postprocedure Evaluation (Signed)
Anesthesia Post Note  Patient: Shirley [name]  Procedure(s) Performed: HYSTERECTOMY ABDOMINAL WITH SALPINGECTOMY (Bilateral )  Patient location during evaluation: PACU Anesthesia Type: General Level of consciousness: awake and alert Pain management: pain level controlled Vital Signs Assessment: post-procedure vital signs reviewed and stable Respiratory status: spontaneous breathing, nonlabored ventilation, respiratory function stable and patient connected to nasal cannula oxygen Cardiovascular status: blood pressure returned to baseline and stable Postop Assessment: no apparent nausea or vomiting Anesthetic complications: no   No complications documented.   Last Vitals:  Vitals:   10/18/20 1230 10/18/20 1256  BP: (!) 118/92 92/66  Pulse: 99 100  Resp: 15 18  Temp:  36.7 C  SpO2: 100% 99%    Last Pain:  Vitals:   10/18/20 1256  TempSrc: Oral  PainSc:                  [name]

## 2020-10-18 NOTE — Progress Notes (Signed)
CNM called to bedside to assess surgical site with bleeding and possible dehiscence. Pt is post-op day 0 from abdominal hysterectomy done by Dr Dalbert Garnet.    Pfannienstiel incision well approximated from right side of incision to approx 4 cm from left edge. Left side of the incision with insorb staples pulled free partially at 2 and 3cm from edge. Dark red serosanguinous drainage welling at site. Pressure held with sterile Abd pad while CNM updated Dr Dalbert Garnet via phone.  Benxoin ointment applied above and below incision, steristrips applied across incision. No further oozing noted at this time. 4x4 gauze pads placed, then Abd pads to cover. Foam tape used to apply pressure dressing. Abdominal pillow placed over pressure dressing, then pt assisted to roll and place clean abdominal binder. Fastened binder over abdominal pillow.   Pt tolerated well, reports improved discomfort once binder/dressing in place.   Dr Dalbert Garnet updated via phone.   Randa Ngo, CNM 10/19/2020 12:03 AM

## 2020-10-18 NOTE — Transfer of Care (Signed)
Immediate Anesthesia Transfer of Care Note  Patient: Shirley [name]  Procedure(s) Performed: HYSTERECTOMY ABDOMINAL WITH SALPINGECTOMY (Bilateral )  Patient Location: PACU  Anesthesia Type:General  Level of Consciousness: drowsy and patient cooperative  Airway & Oxygen Therapy: Patient Spontanous Breathing and Patient connected to face mask oxygen  Post-op Assessment: Report given to RN and Post -op Vital signs reviewed and stable  Post vital signs: Reviewed and stable  Last Vitals:  Vitals Value Taken Time  BP 133/92 10/18/20 1130  Temp 36.6 C 10/18/20 1130  Pulse 100 10/18/20 1139  Resp 13 10/18/20 1139  SpO2 100 % 10/18/20 1139  Vitals shown include unvalidated device data.  Last Pain:  Vitals:   10/18/20 1138  TempSrc:   PainSc: 10-Worst pain ever         Complications: No complications documented.

## 2020-10-18 NOTE — Anesthesia Procedure Notes (Addendum)
Procedure Name: Intubation Date/Time: 10/18/2020 8:03 AM Performed by: Kelton Pillar, CRNA Pre-anesthesia Checklist: Patient identified, Emergency Drugs available, Suction available and Patient being monitored Patient Re-evaluated:Patient Re-evaluated prior to induction Oxygen Delivery Method: Circle system utilized Preoxygenation: Pre-oxygenation with 100% oxygen Induction Type: IV induction Ventilation: Mask ventilation without difficulty Laryngoscope Size: Mac and 3 Grade View: Grade I Tube type: Oral Tube size: 6.5 mm Number of attempts: 1 Airway Equipment and Method: Stylet Placement Confirmation: ETT inserted through vocal cords under direct vision,  positive ETCO2 and breath sounds checked- equal and bilateral Secured at: 22 cm Tube secured with: Tape Dental Injury: Teeth and Oropharynx as per pre-operative assessment

## 2020-10-19 ENCOUNTER — Encounter: Payer: Self-pay | Admitting: Obstetrics and Gynecology

## 2020-10-19 ENCOUNTER — Inpatient Hospital Stay: Payer: BC Managed Care – PPO

## 2020-10-19 DIAGNOSIS — J9601 Acute respiratory failure with hypoxia: Secondary | ICD-10-CM

## 2020-10-19 LAB — CBC
HCT: 17 % — ABNORMAL LOW (ref 36.0–46.0)
Hemoglobin: 5.6 g/dL — ABNORMAL LOW (ref 12.0–15.0)
MCH: 32.9 pg (ref 26.0–34.0)
MCHC: 32.9 g/dL (ref 30.0–36.0)
MCV: 100 fL (ref 80.0–100.0)
Platelets: 230 10*3/uL (ref 150–400)
RBC: 1.7 MIL/uL — ABNORMAL LOW (ref 3.87–5.11)
RDW: 14.4 % (ref 11.5–15.5)
WBC: 5.4 10*3/uL (ref 4.0–10.5)
nRBC: 0 % (ref 0.0–0.2)

## 2020-10-19 LAB — BLOOD GAS, ARTERIAL
Acid-Base Excess: 0.2 mmol/L (ref 0.0–2.0)
Bicarbonate: 25.4 mmol/L (ref 20.0–28.0)
FIO2: 1
O2 Saturation: 75.1 %
Patient temperature: 37
pCO2 arterial: 43 mmHg (ref 32.0–48.0)
pH, Arterial: 7.38 (ref 7.350–7.450)
pO2, Arterial: 41 mmHg — ABNORMAL LOW (ref 83.0–108.0)

## 2020-10-19 LAB — BASIC METABOLIC PANEL
Anion gap: 9 (ref 5–15)
BUN: 16 mg/dL (ref 6–20)
CO2: 21 mmol/L — ABNORMAL LOW (ref 22–32)
Calcium: 8.5 mg/dL — ABNORMAL LOW (ref 8.9–10.3)
Chloride: 102 mmol/L (ref 98–111)
Creatinine, Ser: 0.61 mg/dL (ref 0.44–1.00)
GFR, Estimated: >60 mL/min (ref >60)
Glucose, Bld: 104 mg/dL — ABNORMAL HIGH (ref 70–99)
Potassium: 4.1 mmol/L (ref 3.5–5.1)
Sodium: 132 mmol/L — ABNORMAL LOW (ref 135–145)

## 2020-10-19 LAB — TROPONIN I (HIGH SENSITIVITY)
Troponin I (High Sensitivity): 56 ng/L — ABNORMAL HIGH (ref <18)
Troponin I (High Sensitivity): 46 ng/L — ABNORMAL HIGH (ref <18)

## 2020-10-19 LAB — GLUCOSE, CAPILLARY: Glucose-Capillary: 100 mg/dL — ABNORMAL HIGH (ref 70–99)

## 2020-10-19 LAB — PREPARE RBC (CROSSMATCH)

## 2020-10-19 IMAGING — DX DG CHEST 1V PORT
1 series · 1 of 1 positions shown · non-contrast
Comparison: [DATE]

CLINICAL DATA: Hypoxia, hypertension, asthma

EXAM:
PORTABLE CHEST 1 VIEW

[chest ap]
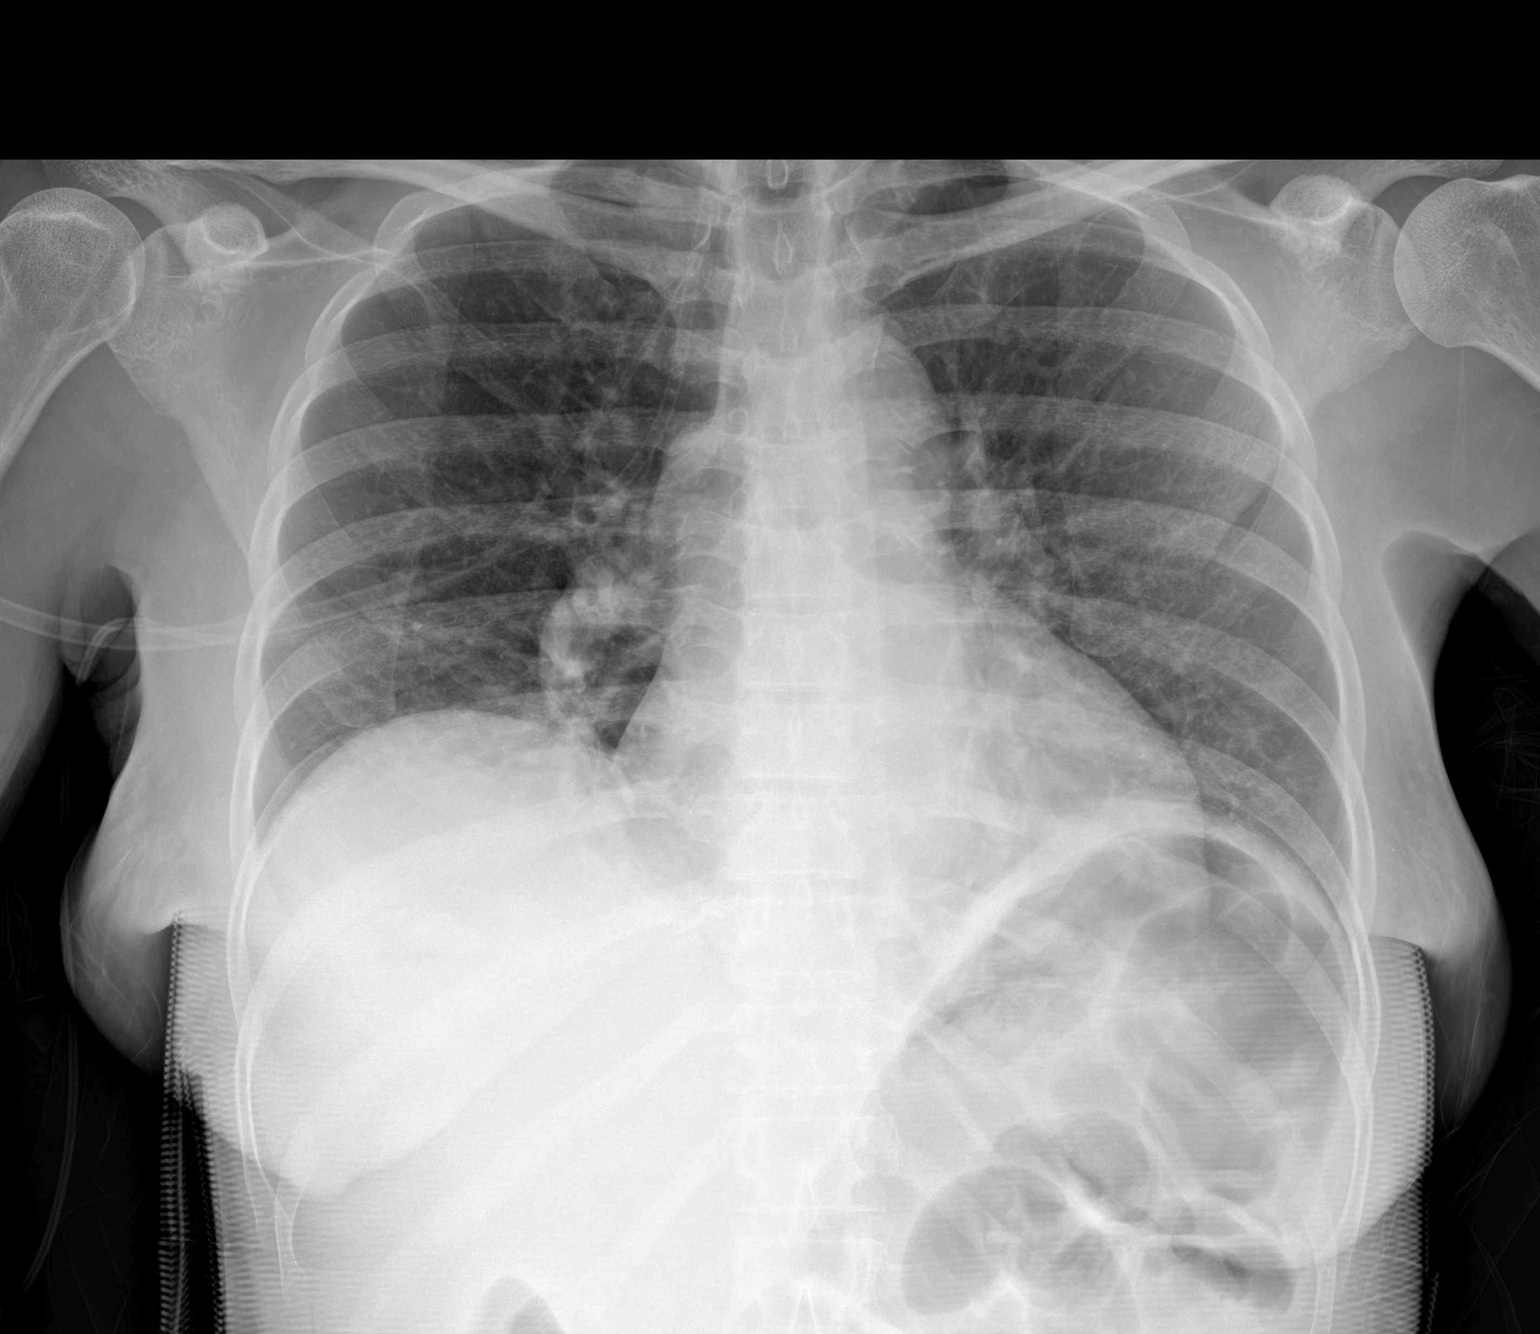

[1 of 1 positions shown; findings below may reference images not displayed]

FINDINGS: Single frontal view of the chest demonstrates an unremarkable
cardiac silhouette. Lung volumes are diminished without airspace
disease, effusion, or pneumothorax. No acute bony abnormality.
IMPRESSION: 1. No acute intrathoracic process.

## 2020-10-19 IMAGING — CT CT ANGIO CHEST
2 of 6 series · 18 of 46 positions shown · IV contrast (APPLIED)
Comparison: Radiograph [DATE], [DATE]

CLINICAL DATA: Acute onset hypoxia

EXAM:
CT ANGIOGRAPHY CHEST WITH CONTRAST
TECHNIQUE: Multidetector CT imaging of the chest was performed using the
standard protocol during bolus administration of intravenous
contrast. Multiplanar CT image reconstructions and MIPs were
obtained to evaluate the vascular anatomy.
CONTRAST:  100mL OMNIPAQUE IOHEXOL 350 MG/ML SOLN

[Series 5: thins · axial · 0.64mm/px · z∈[-340,-119]mm · 16 of 243 slices shown]
[im 11/243  lung]
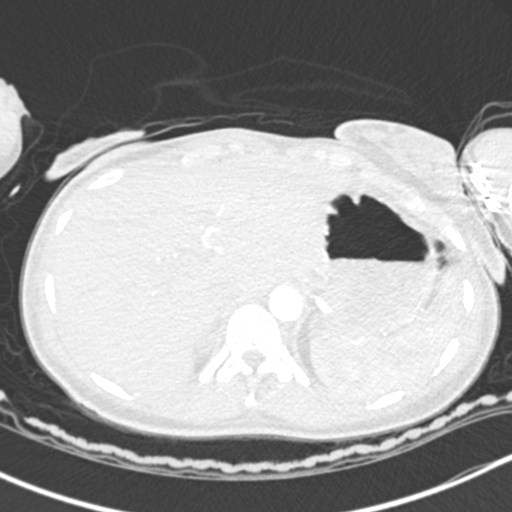
[im 32/243  soft-tissue]
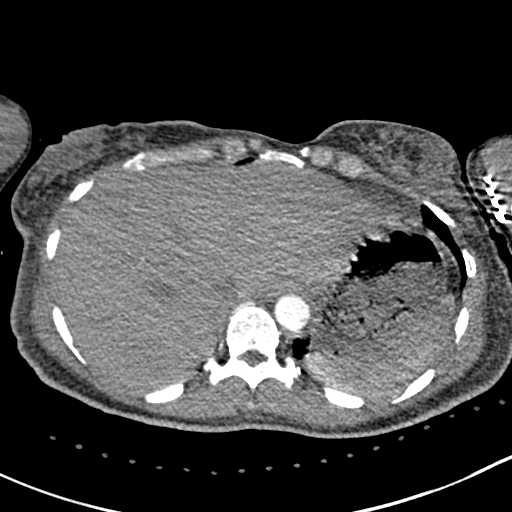
[im 43/243  lung]
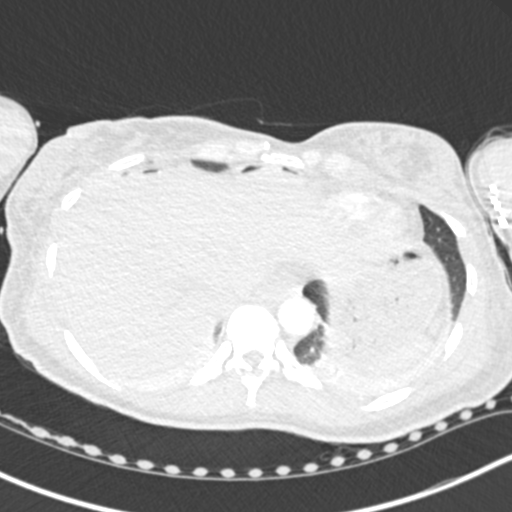
[im 53/243  soft-tissue]
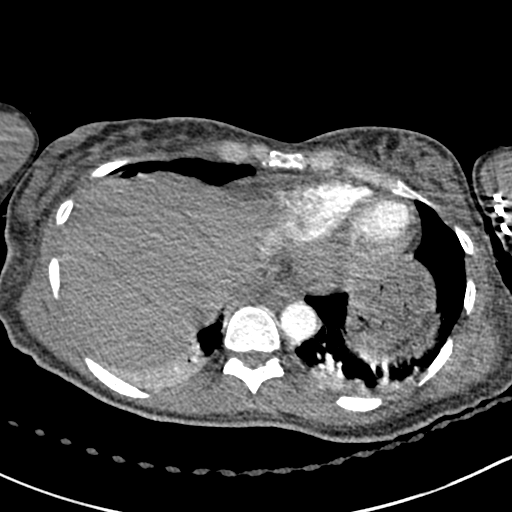
[im 74/243  lung]
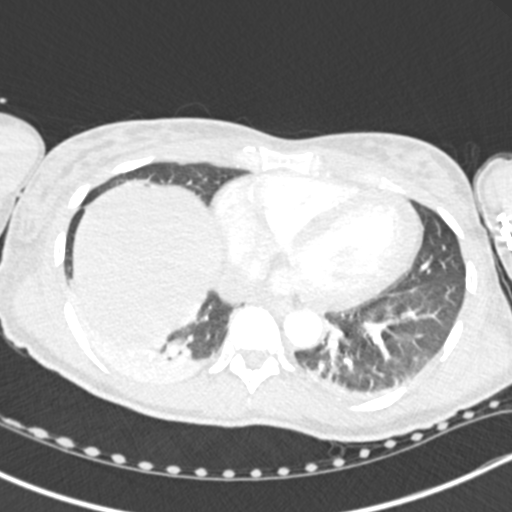
[im 85/243  soft-tissue]
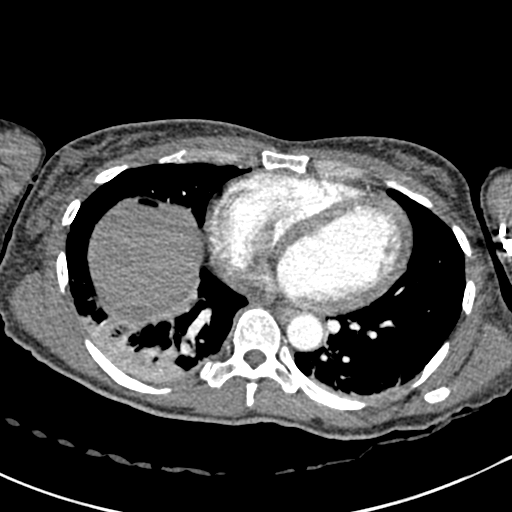
[im 95/243  lung]
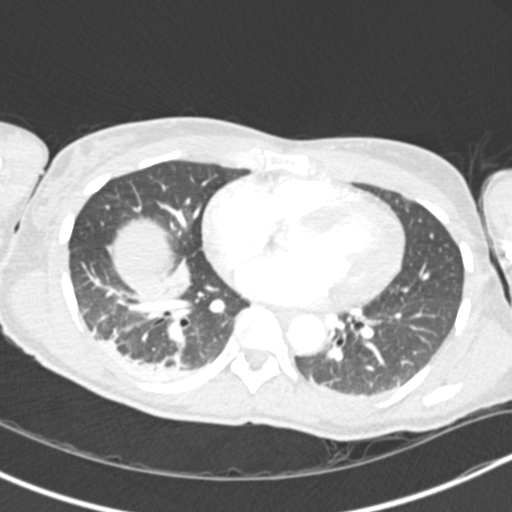
[im 116/243  soft-tissue]
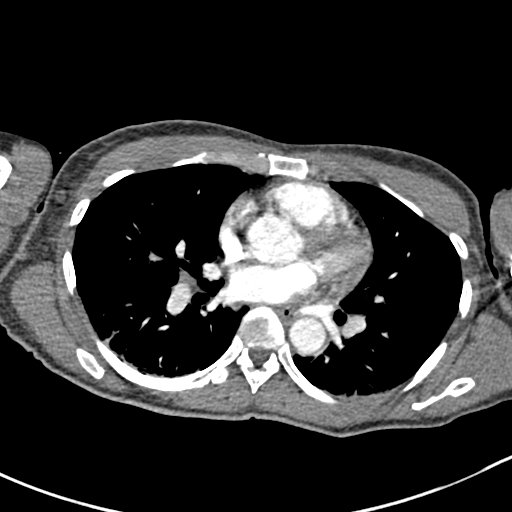
[im 127/243  lung]
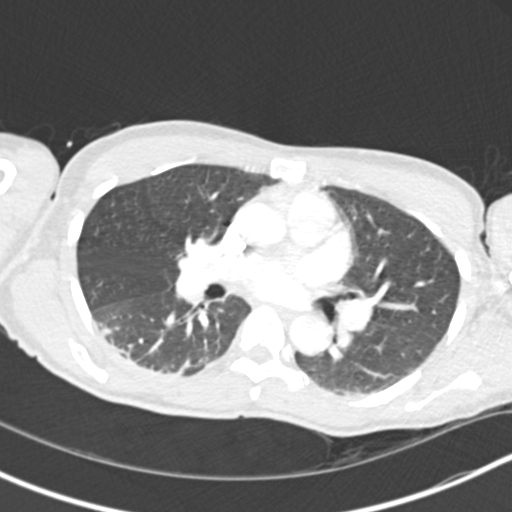
[im 148/243  soft-tissue]
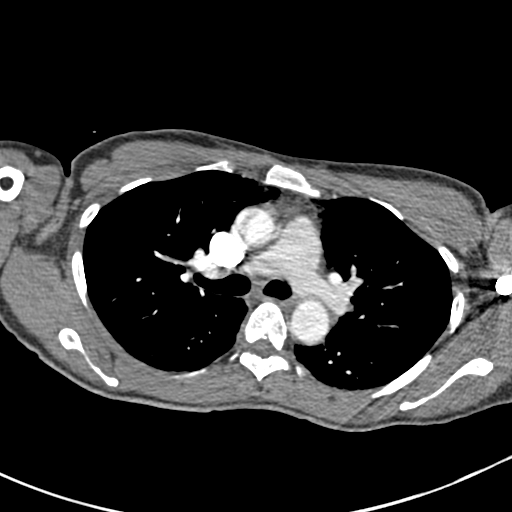
[im 158/243  lung]
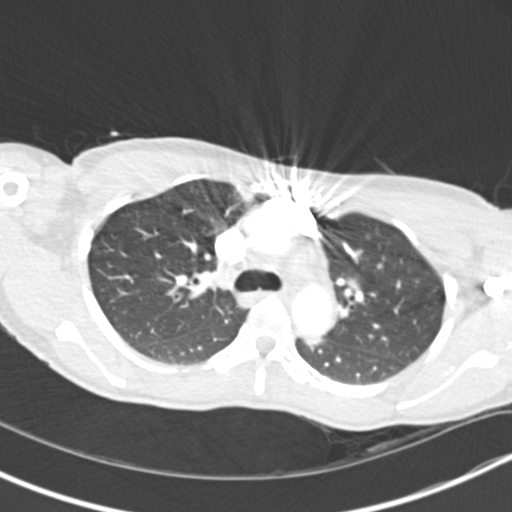
[im 169/243  soft-tissue]
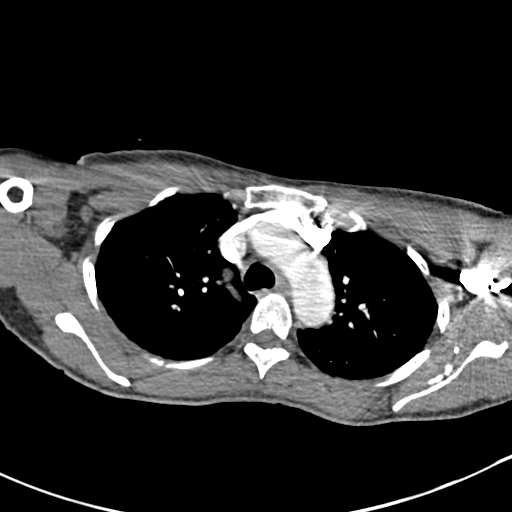
[im 190/243  lung]
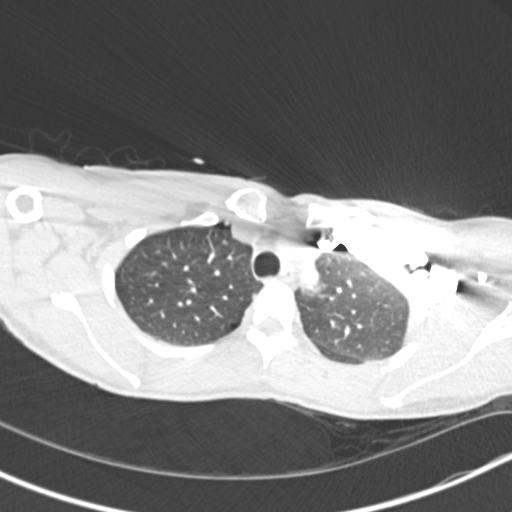
[im 200/243  soft-tissue]
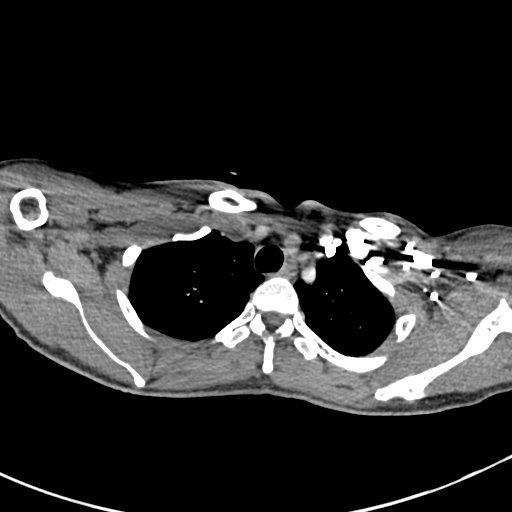
[im 211/243  lung]
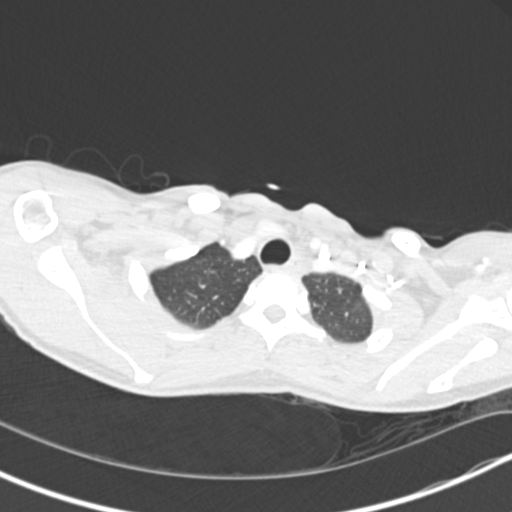
[im 232/243  soft-tissue]
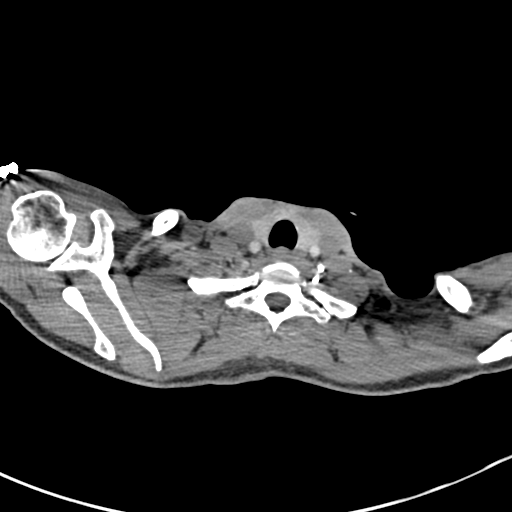

[Series 7: coronal mpr · coronal · 0.49mm/px · 2 of 64 slices shown]
[im 22/64  soft-tissue]
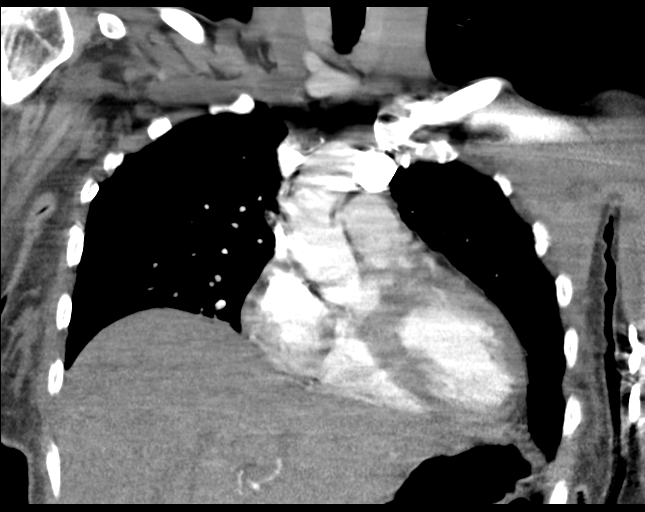
[im 43/64  soft-tissue]
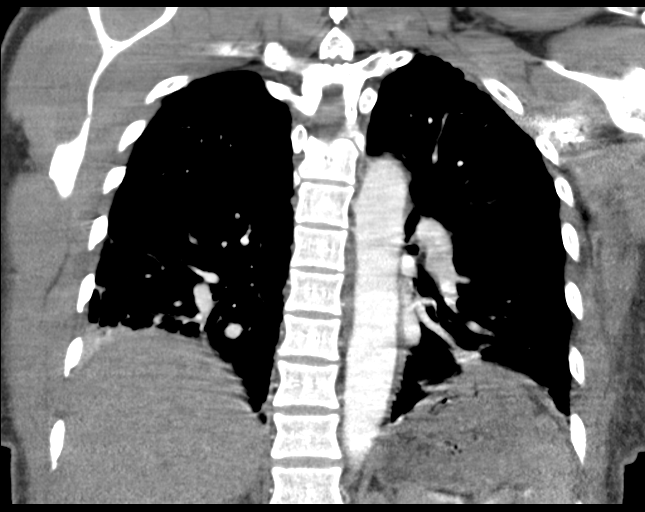

[18 of 46 positions shown; findings below may reference images not displayed]

FINDINGS: Cardiovascular: Suboptimal enhancement of the central pulmonary
arteries (central pulmonary arterial bolus = 168 HU). In combination
with mild motion artifact and streak artifact from retained contrast
in the left brachiocephalic and SVC, may limit detection of smaller
segmental and subsegmental pulmonary artery emboli. No large central
or lobar filling defects are identified. Central pulmonary arteries
are borderline enlarged. Normal heart size. No pericardial effusion.
The aortic root is suboptimally assessed given cardiac pulsation
artifact. The aorta is normal caliber. No acute luminal abnormality
of the imaged aorta. No periaortic stranding or hemorrhage. Shared
origin of the brachiocephalic and left common carotid arteries.
Proximal great vessels are otherwise unremarkable. No major venous
abnormalities.

Mediastinum/Nodes: No mediastinal fluid or gas. Normal thyroid gland
and thoracic inlet. No acute abnormality of the trachea or
esophagus. No worrisome mediastinal, hilar or axillary adenopathy.

Lungs/Pleura: Bibasilar areas of atelectasis, right greater than
left. No other focal consolidative process, features of edema,
pneumothorax, or effusion. No suspicious pulmonary nodules or
masses.

Upper Abdomen: No acute abnormalities present in the visualized
portions of the upper abdomen.

Musculoskeletal: No acute osseous abnormality or suspicious osseous
lesion. No worrisome chest wall masses or lesions.

Review of the MIP images confirms the above findings.
IMPRESSION: 1. Suboptimal enhancement of the central pulmonary arteries (central
pulmonary arterial bolus 168 HU). In combination with mild motion
artifact and streak artifact from retained contrast in the left
brachiocephalic and SVC, may limit detection of smaller segmental
and subsegmental pulmonary artery emboli. No large central or lobar
filling defects are identified.
2. Bibasilar areas of atelectasis, right greater than left.
3. No other acute intrathoracic process.

## 2020-10-19 MED ORDER — KETOROLAC TROMETHAMINE 30 MG/ML IJ SOLN
30.0000 mg | Freq: Three times a day (TID) | INTRAMUSCULAR | Status: AC
  Administered 2020-10-19: 30 mg via INTRAVENOUS
  Filled 2020-10-19: qty 1

## 2020-10-19 MED ORDER — KETOROLAC TROMETHAMINE 30 MG/ML IJ SOLN
30.0000 mg | Freq: Once | INTRAMUSCULAR | Status: AC
  Administered 2020-10-19: 30 mg via INTRAVENOUS
  Filled 2020-10-19: qty 1

## 2020-10-19 MED ORDER — DIPHENHYDRAMINE HCL 25 MG PO CAPS
25.0000 mg | ORAL_CAPSULE | Freq: Once | ORAL | Status: AC
  Administered 2020-10-19: 25 mg via ORAL
  Filled 2020-10-19: qty 1

## 2020-10-19 MED ORDER — HYDROMORPHONE 1 MG/ML IV SOLN: INTRAVENOUS | Status: DC

## 2020-10-19 MED ORDER — SODIUM CHLORIDE 0.9 % IV SOLN
INTRAVENOUS | Status: DC
  Filled 2020-10-19: qty 2.5

## 2020-10-19 MED ORDER — ACETAMINOPHEN 325 MG PO TABS: 650.0000 mg | ORAL_TABLET | Freq: Once | ORAL | Status: DC

## 2020-10-19 MED ORDER — SODIUM CHLORIDE 0.9% IV SOLUTION: Freq: Once | INTRAVENOUS | Status: AC

## 2020-10-19 MED ORDER — IOHEXOL 350 MG/ML SOLN
100.0000 mL | Freq: Once | INTRAVENOUS | Status: AC | PRN
  Administered 2020-10-19: 100 mL via INTRAVENOUS

## 2020-10-19 MED ORDER — ACETAMINOPHEN 500 MG PO TABS
1000.0000 mg | ORAL_TABLET | Freq: Four times a day (QID) | ORAL | Status: DC
Start: 2020-10-19 — End: 2020-10-20
  Administered 2020-10-19 – 2020-10-20 (×4): 1000 mg via ORAL
  Filled 2020-10-19 (×4): qty 2

## 2020-10-19 NOTE — Progress Notes (Addendum)
1 Day Post-Op       Procedure(s): HYSTERECTOMY ABDOMINAL WITH SALPINGECTOMY (Bilateral) Subjective: The patient is doing well.  No nausea or vomiting. Pain is adequately controlled.  Objective: Vital signs in last 24 hours: Temp:  [97.8 F (36.6 C)-99.3 F (37.4 C)] 98.1 F (36.7 C) (02/09 0759) Pulse Rate:  [87-105] 94 (02/09 0759) Resp:  [9-20] 18 (02/09 0759) BP: (92-141)/(64-100) 108/74 (02/09 0759) SpO2:  [93 %-100 %] 96 % (02/09 0759) FiO2 (%):  [100 %] 100 % (02/09 0325)  Intake/Output  Intake/Output Summary (Last 24 hours) at 10/19/2020 0846 Last data filed at 10/19/2020 0802 Gross per 24 hour  Intake 3791.75 ml  Output 2365 ml  Net 1426.75 ml    Physical Exam:  General: Alert and oriented. CV: RRR Lungs: Clear bilaterally. GI: Soft, Nondistended. Incisions: Clean and dry. Urine: Clear, Foley in place Extremities: Nontender, no erythema, no edema.  Lab Results: Recent Labs    10/18/20 2325  HGB 7.5*  HCT 22.7*  WBC 9.9  PLT 289                 Results for orders placed or performed during the hospital encounter of 10/18/20 (from the past 24 hour(s))  Creatinine, serum     Status: None   Collection Time: 10/18/20 12:52 PM  Result Value Ref Range   Creatinine, Ser 0.64 0.44 - 1.00 mg/dL   GFR, Estimated >56 >21 mL/min  CBC     Status: Abnormal   Collection Time: 10/18/20 11:25 PM  Result Value Ref Range   WBC 9.9 4.0 - 10.5 K/uL   RBC 2.27 (L) 3.87 - 5.11 MIL/uL   Hemoglobin 7.5 (L) 12.0 - 15.0 g/dL   HCT 30.8 (L) 65.7 - 84.6 %   MCV 100.0 80.0 - 100.0 fL   MCH 33.0 26.0 - 34.0 pg   MCHC 33.0 30.0 - 36.0 g/dL   RDW 96.2 95.2 - 84.1 %   Platelets 289 150 - 400 K/uL   nRBC 0.0 0.0 - 0.2 %  Basic metabolic panel     Status: Abnormal   Collection Time: 10/18/20 11:25 PM  Result Value Ref Range   Sodium 132 (L) 135 - 145 mmol/L   Potassium 5.5 (H) 3.5 - 5.1 mmol/L   Chloride 104 98 - 111 mmol/L   CO2 21 (L) 22 - 32 mmol/L   Glucose, Bld 132 (H)  70 - 99 mg/dL   BUN 22 (H) 6 - 20 mg/dL   Creatinine, Ser 3.24 0.44 - 1.00 mg/dL   Calcium 8.3 (L) 8.9 - 10.3 mg/dL   GFR, Estimated >40 >10 mL/min   Anion gap 7 5 - 15    Assessment/Plan: 1 Day Post-Op       Procedure(s): HYSTERECTOMY ABDOMINAL WITH SALPINGECTOMY (Bilateral)  -Acute blood loss anemia - hemodynamically stable and asymptomatic; start PO ferrous sulfate when tolerating po BID with stool softeners and ascorbic acid. Consider IV iron tomorrow after lab repeat, or if orthostatics today -advance diet as tolerated -foley out today -ambulation in halls, at least to chair for 4 hours -continue incentive spirometry - continue inpatient care -Pain: continue PCA rescue but will take basal rate off today  -Hyperkalemia: pt on po potassium daily. ddx includes muscle injury from surgery. Hold po potassium today.  Christeen Douglas, MD   LOS: 1 day   Christeen Douglas 10/19/2020, 8:46 AM

## 2020-10-19 NOTE — Progress Notes (Signed)
Rapid Response Follow-up:  - Hbg 5.3 and is receiving transfusions. - Trops downtrending - VS Stable. - NAD.

## 2020-10-19 NOTE — Progress Notes (Addendum)
1630 Pt OOB to BR with CNA, 30 min later when pt getting back tp bed, 1653 pt started gasping for air. CNA called for assistance, Upon entry to room pt gasping, Rapid response called, RR 50-60, SPO2 on arrival 69%, dropping quickly to 40% and 25% at the lowest point. HR 112, BP 136/87. 1702 Dr. Dalbert Garnet called and report given and RapidResponse at [facility].   Above copied from Verda Cumins note:  10L of oxygen applied to pt at 02 sat of 25% with a non-rebreather, sats quickly recovered to 100%. Rapid response arrived to [facility].   SCDs have been on patient my entire shift, and were on when I received report

## 2020-10-19 NOTE — Progress Notes (Signed)
Rapid Response Event Note   Reason for Call :   Acute SOB, hypoxia  Initial Focused Assessment:   - Patient is AA+Ox4. - Mild dyspnea - Lungs CTA - On NRB and in bed  Interventions:   - ABG -CTA Chest - PE  Plan of Care:   - Patient transported to CT and back. - ABG obtained. - O2 weaned to 6 L/min [place]  Event Summary:   MD Notified: Dr. Dalbert Garnet; Dr. Belia Heman Call Time: 1658 hrs Arrival Time: 1702 hrs End Time: 1800 hrs  Rosana Fret, RN

## 2020-10-19 NOTE — Progress Notes (Signed)
Called to patient's room for acute desaturation on ambulation. I find her calm and with 100% on 4L, but she relays a hx of bronchitis. She felt that her pain was significant enough to cause her to catch her breath, and she felt dizzy after standing in the bathroom.   Vitals:   10/19/20 1212 10/19/20 1513  BP: 104/65 107/69  Pulse: 92 92  Resp: 16   Temp: 98.5 F (36.9 C) 97.6 F (36.4 C)  SpO2:     Dressing c/d/i Pulm: LUngs both ctab in 6 ling fields CV: RRR, not currently tachy  CT angio chest: neg for PE, positive for bibasilar atelectasis, R>L  DDX; orthostatic, anxiety, reactive airway  Plan: Continue 2L [place] for now - nebulizer - xanax PRN - appreciate pulm critical care response/input  Pain control prn

## 2020-10-19 NOTE — Progress Notes (Signed)
1st unit of RBC's complete. Paged Dr. Elesa Massed about H&H. Will give all 3 units of RBC's and recheck CBC in AM.

## 2020-10-19 NOTE — Progress Notes (Signed)
Respiratory called for patient to receive flutter valve. Resp arrived at bedside and instructed pt, pt demonstrated understanding.

## 2020-10-19 NOTE — Consult Note (Signed)
NAME:  Shirley [name], MRN:  [id number], DOB:  March 25, 1976, LOS: 1 ADMISSION DATE:  10/18/2020, CONSULTATION DATE:  10/19/2020 REFERRING MD:  Dr. Dalbert Garnet , CHIEF COMPLAINT:  Acute Respiratory Distress   Brief History:  45 year old female admitted 10/18/2020 for elective hysterectomy.  On 10/19/2020 she developed acute hypoxic respiratory failure and transient hypotension.  History of Present Illness:  Shirley [name] is a 45 year old female with a past medical history significant for asthma, GERD, heart murmur, hypertension, pyonephritis, and seizures who presented to [facility] on 10/18/2020 for elective hysterectomy with bilateral salpingectomy due to a diagnosis of PTAS and pelvic pain.  Procedure was performed without incident and she was admitted to Med-Surg unit.  On 10/19/2020 following ambulation out of bed, she developed acute respiratory distress, severe Hypoxia (initial O2 sats 69% and dropped to 40%), along with transient hypotension.  Rapid response was called.  Upon arrival to room she was found to be mildly tachypneic on 100% NRB mask with O2 saturations.  Hypotension had resolved with BP 136/87.  CXR is negative for any acute cardiopulmonary process. ABG with severe hypoxia: pH 7.38 / pCO2 43 / pO2 41 / Bicarb 25.4.  CTA Chest is negative for pulmonary embolism, is concenering for bibasilar atelectasis.  Found to have a hemoglobin of 5.6, and is set to receive 2 units of pRBCs.   PCCM is consulted for assistance in management of Acute Hypoxic Respiratory Failure.  Past Medical History:  Asthma GERD Heart murmur Hypertension Pyelonephritis Seizures  Significant Hospital Events:  2/8: Underwent elective hysterectomy: Admitted to MedSurg unit following procedure 2/9: Rapid response called due to acute hypoxic respiratory failure and transient hypotension: PCCM consulted  Consults:  OB/GYN (Primary service) PCCM  Procedures:  2/8: Hysterectomy  Significant Diagnostic Tests:   2/9: CXR>>1. No acute intrathoracic process. 2/9: CTA Chest>>1. Suboptimal enhancement of the central pulmonary arteries (central pulmonary arterial bolus 168 HU). In combination with mild motion artifact and streak artifact from retained contrast in the left brachiocephalic and SVC, may limit detection of smaller segmental and subsegmental pulmonary artery emboli. No large central or lobar filling defects are identified. 2. Bibasilar areas of atelectasis, right greater than left. 3. No other acute intrathoracic process.  Micro Data:  2/4: SARS-CoV-2 PCR>> negative  Antimicrobials:  N/A  Interim History / Subjective:  Rapid response called due to hypotension, hypoxia, and acute respiratory distress Upon arrival to room, pt with mild tachypnea on NRB 100% with O2 sats 100%; normotensive ABG being drawn and to get CXR Pt awake and alert Denies chest pain, SOB, N/V/D, edema, palpitations Does report surgical abdominal pain, and lightheadness  Objective   Blood pressure 107/69, pulse 92, temperature 97.6 F (36.4 C), temperature source Oral, resp. rate 16, height 5\' 2"  (1.575 m), weight 54 kg, SpO2 99 %.    FiO2 (%):  [100 %] 100 %   Intake/Output Summary (Last 24 hours) at 10/19/2020 1729 Last data filed at 10/19/2020 1145 Gross per 24 hour  Intake 1842.56 ml  Output 1815 ml  Net 27.56 ml   Filed Weights   10/18/20 0640  Weight: 54 kg    Examination: General: Acutely ill-appearing female, sitting in bed, on nonrebreather mask, with mild tachypnea HENT: Atraumatic, normocephalic, neck supple, no JVD Lungs: Clear to auscultation throughout, no wheezing or rhonchi, even, mild tachypnea Cardiovascular: Tachycardia, regular rhythm, S1-S2, no murmurs, rubs, gallops, 2+ distal pulses Abdomen: Soft, tender, nondistended, abdominal incision clean and dry and intact. Extremities: Normal bulk and tone,  no deformities, no edema, no clubbing Neuro: Awake, alert and oriented x4, follows  commands, no focal deficits, speech clear GU: Deferred Skin: Warm and dry.  No obvious rashes, lesions, ulcerations  Resolved Hospital Problem list   N/A  Assessment & Plan:   Acute hypoxic respiratory failure  Hx: Asthma -Supplemental O2 as needed to maintain O2 sats greater than 92% -Follow intermittent chest x-ray and ABG as needed -Chest x-ray on 2/9 is negative for any acute cardiopulmonary process -CTA chest on 2/9 is negative for pulmonary embolism, does show bibasilar areas of atelectasis with right greater than left -As needed bronchodilators -Incentive Spirometry & Flutter valve   Transient hypotension, suspect orthostatic hypotension -Maintain MAP greater than 65 -IV fluids -Check CBC and troponin ~ Hgb 5.6, to receive 2 units pRBCs   Acute Blood Loss Anemia -Monitor for S/Sx of bleeding -Trend CBC ~ Hgb 5.6, to receive 2 units pRBCs -SCD's for VTE Prophylaxis (resume chemical prophylaxis per OB/GYN due to recent surgery) -Transfuse for Hgb <7   Best practice (evaluated daily)  Diet: Regular  Pain/Anxiety/Delirium protocol (if indicated): PCA VAP protocol (if indicated): N/A DVT prophylaxis: SCD's GI prophylaxis: Protonix Glucose control: N/A Mobility: As tolerated Disposition: Med/surg  Goals of Care:  Last date of multidisciplinary goals of care discussion:N/A Family and staff present: Updated pt at bedside 10/19/20 Summary of discussion: BP improved, CXR is normal, obtain CTA Chest Follow up goals of care discussion due: 10/20/2020 Code Status: Full Code  Labs   CBC: Recent Labs  Lab 10/18/20 2325  WBC 9.9  HGB 7.5*  HCT 22.7*  MCV 100.0  PLT 289    Basic Metabolic Panel: Recent Labs  Lab 10/18/20 1252 10/18/20 2325  NA  --  132*  K  --  5.5*  CL  --  104  CO2  --  21*  GLUCOSE  --  132*  BUN  --  22*  CREATININE 0.64 0.97  CALCIUM  --  8.3*   GFR: Estimated Creatinine Clearance: 59.1 mL/min (by C-G formula based on SCr of 0.97  mg/dL). Recent Labs  Lab 10/18/20 2325  WBC 9.9    Liver Function Tests: No results for input(s): AST, ALT, ALKPHOS, BILITOT, PROT, ALBUMIN in the last 168 hours. No results for input(s): LIPASE, AMYLASE in the last 168 hours. No results for input(s): AMMONIA in the last 168 hours.  ABG    Component Value Date/Time   PHART 7.38 10/19/2020 1705   PCO2ART 43 10/19/2020 1705   PO2ART 41 (L) 10/19/2020 1705   HCO3 25.4 10/19/2020 1705   TCO2 26 10/29/2016 0704   ACIDBASEDEF 1.0 07/15/2007 1357   O2SAT 75.1 10/19/2020 1705     Coagulation Profile: No results for input(s): INR, PROTIME in the last 168 hours.  Cardiac Enzymes: No results for input(s): CKTOTAL, CKMB, CKMBINDEX, TROPONINI in the last 168 hours.  HbA1C: No results found for: HGBA1C  CBG: No results for input(s): GLUCAP in the last 168 hours.  Review of Systems:   Positives in BOLD: Gen: Denies fever, chills, weight change, fatigue, night sweats, +lightheadness HEENT: Denies blurred vision, double vision, hearing loss, tinnitus, sinus congestion, rhinorrhea, sore throat, neck stiffness, dysphagia PULM: Denies shortness of breath, cough, sputum production, hemoptysis, wheezing CV: Denies chest pain, edema, orthopnea, paroxysmal nocturnal dyspnea, palpitations GI: Denies +abdominal (surgical) pain, nausea, vomiting, diarrhea, hematochezia, melena, constipation, change in bowel habits GU: Denies dysuria, hematuria, polyuria, oliguria, urethral discharge Endocrine: Denies hot or cold intolerance, polyuria, polyphagia or  appetite change Derm: Denies rash, dry skin, scaling or peeling skin change Heme: Denies easy bruising, bleeding, bleeding gums Neuro: Denies headache, numbness, weakness, slurred speech, loss of memory or consciousness   Past Medical History:  She,  has a past medical history of Anemia, Asthma, Bacteremia, Blood dyscrasia, GERD (gastroesophageal reflux disease), Heart murmur, History of hiatal  hernia, Hypertension, Kidney infection, Migraine, Pyelonephritis, Seizure (HCC) (05/09/2017), and Wears dentures.   Surgical History:   Past Surgical History:  Procedure Laterality Date  . ABDOMINAL HERNIA REPAIR    . CESAREAN SECTION     x 4  . CESAREAN SECTION     x 4  . COLONOSCOPY WITH PROPOFOL N/A 07/24/2019   Procedure: COLONOSCOPY WITH PROPOFOL;  Surgeon: Pasty Spillers, MD;  Location: [facility];  Service: Endoscopy;  Laterality: N/A;  . DILATATION & CURETTAGE/HYSTEROSCOPY WITH MYOSURE N/A 10/11/2020   Procedure: DILATATION & CURETTAGE/HYSTEROSCOPY WITH MYOSURE LYSIS OF ADHESIONS;  Surgeon: Christeen Douglas, MD;  Location: [facility];  Service: Gynecology;  Laterality: N/A;  . ENDOMETRIAL ABLATION  07/31/2016  . ESOPHAGOGASTRODUODENOSCOPY (EGD) WITH PROPOFOL N/A 07/24/2019   Procedure: ESOPHAGOGASTRODUODENOSCOPY (EGD) WITH PROPOFOL;  Surgeon: Pasty Spillers, MD;  Location: [facility];  Service: Endoscopy;  Laterality: N/A;  . HYSTERECTOMY ABDOMINAL WITH SALPINGECTOMY Bilateral 10/18/2020   Procedure: HYSTERECTOMY ABDOMINAL WITH SALPINGECTOMY;  Surgeon: Christeen Douglas, MD;  Location: [facility];  Service: Gynecology;  Laterality: Bilateral;  . HYSTEROSCOPY  07/31/2016   Procedure: HYSTEROSCOPY WITH HYDROTHERMAL ABLATION;  Surgeon: Lookeba Bing, MD;  Location: [facility];  Service: Gynecology;;     Social History:   reports that she has been smoking cigarettes. She has been smoking about 0.25 packs per day. She has never used smokeless tobacco. She reports current alcohol use. She reports current drug use. Drug: Marijuana.   Family History:  Her family history includes Colon cancer (age of onset: 62) in her father; Hypertension in her mother; Stroke in her brother.   Allergies Allergies  Allergen Reactions  . Lisinopril Anaphylaxis and Swelling    Feet, hands, and throat became swollen  . Aspirin Nausea Only    Makes stomach burn  . Ibuprofen Nausea  Only    Makes stomach burn  . Other Nausea Only and Other (See Comments)    No spicy foods = Indigestion, also     Home Medications  Prior to Admission medications   Medication Sig Start Date End Date Taking? Authorizing Provider  acetaminophen (TYLENOL) 500 MG tablet Take 1,000 mg by mouth every 6 (six) hours as needed for moderate pain or headache.   Yes [provider]  albuterol (PROVENTIL HFA;VENTOLIN HFA) 108 (90 Base) MCG/ACT inhaler Inhale 2 puffs into the lungs every 6 (six) hours as needed for wheezing or shortness of breath.   Yes [provider]  Ascorbic Acid (VITAMIN C PO) Take 1 tablet by mouth daily.   Yes [provider]  dicyclomine (BENTYL) 10 MG capsule Take 10 mg by mouth 3 (three) times daily as needed for spasms. 10/06/20  Yes [provider]  diphenhydrAMINE (BENADRYL) 25 MG tablet Take 25 mg by mouth 2 (two) times daily as needed for allergies.   Yes [provider]  docusate sodium (COLACE) 100 MG capsule Take 1 capsule (100 mg total) by mouth 2 (two) times daily. To keep stools soft 10/11/20  Yes Christeen Douglas, MD  fluticasone [facility]) 50 MCG/ACT nasal spray Place 1 spray into both nostrils daily as needed for allergies or  rhinitis.   Yes [provider]  gabapentin (NEURONTIN) 800 MG tablet Take 1 tablet (800 mg total) by mouth at bedtime for 14 days. Take nightly for 3 days, then up to 14 days as needed Patient taking differently: Take 800 mg by mouth at bedtime. 10/11/20 10/25/20 Yes Christeen Douglas, MD  magnesium oxide (MAG-OX) 400 MG tablet Take 400 mg by mouth 2 (two) times daily.   Yes [provider]  Multiple Vitamins-Minerals (WOMENS DAILY FORMULA PO) Take 1 tablet by mouth daily.   Yes [provider]  ondansetron (ZOFRAN ODT) 4 MG disintegrating tablet 4mg  ODT q4 hours prn nausea/vomit 07/20/20  Yes 13/10/21, PA-C  oxyCODONE-acetaminophen (PERCOCET/ROXICET) 5-325 MG tablet Take 1  tablet by mouth every 6 (six) hours as needed for severe pain.   Yes [provider]  pantoprazole (PROTONIX) 40 MG tablet Take 40 mg by mouth 2 (two) times daily. 10/06/20  Yes [provider]  polyethylene glycol (MIRALAX / GLYCOLAX) packet Take 17 g by mouth daily as needed for mild constipation.    Yes [provider]  Potassium Chloride ER 20 MEQ TBCR Take 20 mEq by mouth daily. 11/12/19  Yes [provider]  sucralfate (CARAFATE) 1 g tablet Take 1 tablet (1 g total) by mouth 4 (four) times daily -  with meals and at bedtime. Patient taking differently: Take 1 g by mouth 5 (five) times daily. 07/20/20 08/19/20 Yes 14/10/21, PA-C  ondansetron (ZOFRAN) 4 MG tablet Take 1 tablet (4 mg total) by mouth every 8 (eight) hours as needed for up to 10 doses for nausea or vomiting. Patient not taking: No sig reported 10/08/20   10/10/20, MD  oxyCODONE (OXY IR/ROXICODONE) 5 MG immediate release tablet Take 1 tablet (5 mg total) by mouth every 4 (four) hours as needed for severe pain. Patient not taking: No sig reported 10/11/20   12/09/20, MD     Critical care time: 40 minutes    Christeen Douglas, [facility] Pulmonary & Critical Care Medicine Pager: [phone]

## 2020-10-19 NOTE — Progress Notes (Signed)
1630 Pt OOB to BR with CNA, 30 min later when pt getting back tp bed, 1653 pt started gasping for air. CNA called for assistance, Upon entry to room pt gasping, Rapid response called, RR 50-60, SPO2 on arrival 69%, dropping quickly to 40% and 25% at the lowest point. HR 112, BP 136/87. 1702 Dr. Dalbert Garnet called and report given and RapidResponse at [facility].

## 2020-10-19 NOTE — Progress Notes (Signed)
   10/19/20 1700  Clinical Encounter Type  Visited With Patient  Visit Type Initial  Referral From Nurse  Consult/Referral To Chaplain   Chaplain responded to a Rapid response page. PT was having difficulty breathing, while attempting to use the bathroom. Chaplain checked on staff and assessed the situation. Once the PT's nurse notified the chaplain everything was ok, he told them to contact the chaplain if needed.

## 2020-10-20 ENCOUNTER — Encounter: Payer: Self-pay | Admitting: Obstetrics and Gynecology

## 2020-10-20 ENCOUNTER — Inpatient Hospital Stay: Payer: BC Managed Care – PPO

## 2020-10-20 LAB — BASIC METABOLIC PANEL
Anion gap: 8 (ref 5–15)
BUN: 10 mg/dL (ref 6–20)
CO2: 23 mmol/L (ref 22–32)
Calcium: 8.9 mg/dL (ref 8.9–10.3)
Chloride: 107 mmol/L (ref 98–111)
Creatinine, Ser: 0.5 mg/dL (ref 0.44–1.00)
GFR, Estimated: >60 mL/min (ref >60)
Glucose, Bld: 95 mg/dL (ref 70–99)
Potassium: 4 mmol/L (ref 3.5–5.1)
Sodium: 138 mmol/L (ref 135–145)

## 2020-10-20 LAB — CBC
HCT: 26.3 % — ABNORMAL LOW (ref 36.0–46.0)
HCT: 29.2 % — ABNORMAL LOW (ref 36.0–46.0)
Hemoglobin: 8.9 g/dL — ABNORMAL LOW (ref 12.0–15.0)
Hemoglobin: 9.9 g/dL — ABNORMAL LOW (ref 12.0–15.0)
MCH: 32.4 pg (ref 26.0–34.0)
MCH: 32.5 pg (ref 26.0–34.0)
MCHC: 33.8 g/dL (ref 30.0–36.0)
MCHC: 33.9 g/dL (ref 30.0–36.0)
MCV: 95.6 fL (ref 80.0–100.0)
MCV: 95.7 fL (ref 80.0–100.0)
Platelets: 231 10*3/uL (ref 150–400)
Platelets: 252 10*3/uL (ref 150–400)
RBC: 2.75 MIL/uL — ABNORMAL LOW (ref 3.87–5.11)
RBC: 3.05 MIL/uL — ABNORMAL LOW (ref 3.87–5.11)
RDW: 14.6 % (ref 11.5–15.5)
RDW: 14.6 % (ref 11.5–15.5)
WBC: 6.6 10*3/uL (ref 4.0–10.5)
WBC: 7.5 10*3/uL (ref 4.0–10.5)
nRBC: 0 % (ref 0.0–0.2)
nRBC: 0 % (ref 0.0–0.2)

## 2020-10-20 LAB — COMPREHENSIVE METABOLIC PANEL
ALT: 16 U/L (ref 0–44)
AST: 31 U/L (ref 15–41)
Albumin: 3.6 g/dL (ref 3.5–5.0)
Alkaline Phosphatase: 39 U/L (ref 38–126)
Anion gap: 9 (ref 5–15)
BUN: 7 mg/dL (ref 6–20)
CO2: 25 mmol/L (ref 22–32)
Calcium: 9.3 mg/dL (ref 8.9–10.3)
Chloride: 104 mmol/L (ref 98–111)
Creatinine, Ser: 0.42 mg/dL — ABNORMAL LOW (ref 0.44–1.00)
GFR, Estimated: >60 mL/min (ref >60)
Glucose, Bld: 87 mg/dL (ref 70–99)
Potassium: 4.2 mmol/L (ref 3.5–5.1)
Sodium: 138 mmol/L (ref 135–145)
Total Bilirubin: 0.4 mg/dL (ref 0.3–1.2)
Total Protein: 6.6 g/dL (ref 6.5–8.1)

## 2020-10-20 LAB — TROPONIN I (HIGH SENSITIVITY): Troponin I (High Sensitivity): 19 ng/L — ABNORMAL HIGH (ref <18)

## 2020-10-20 IMAGING — CT CT ABD-PELV W/ CM
2 of 5 series · 15 of 46 positions shown, 17 images · IV contrast (APPLIED)
Comparison: [DATE]

CLINICAL DATA: Abdominal pain after abdominal hysterectomy

EXAM:
CT ABDOMEN AND PELVIS WITH CONTRAST
TECHNIQUE: Multidetector CT imaging of the abdomen and pelvis was performed
using the standard protocol following bolus administration of
intravenous contrast.
CONTRAST:  100mL OMNIPAQUE IOHEXOL 300 MG/ML  SOLN

[Series 2: routine abd/pel with · axial · 0.75mm/px · z∈[-418,+6]mm · 12 of 99 slices shown, 14 images]
[im 7/99  soft-tissue]
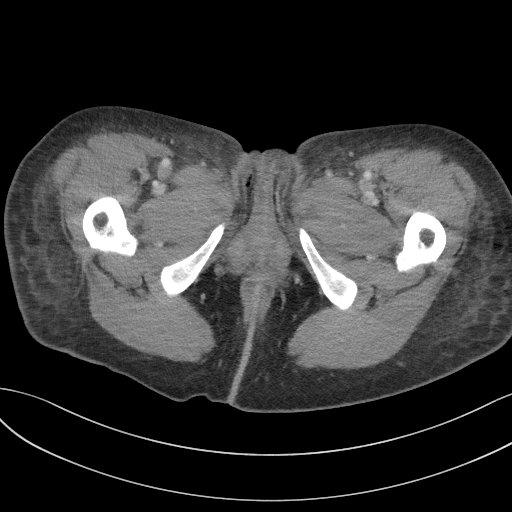
[im 7/99  bone]
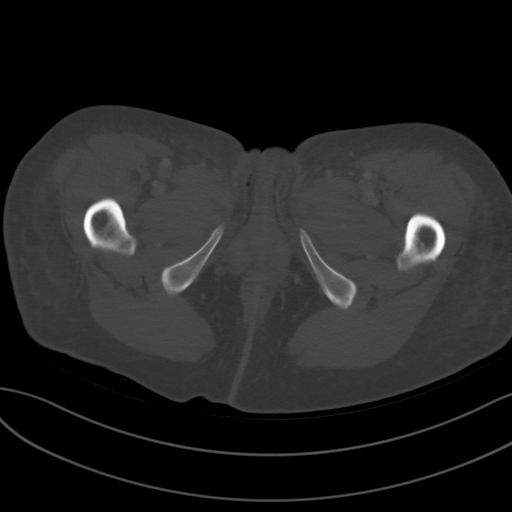
[im 14/99  soft-tissue]
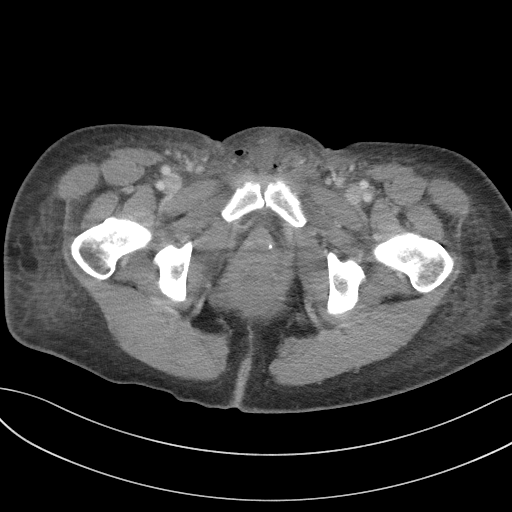
[im 20/99  soft-tissue]
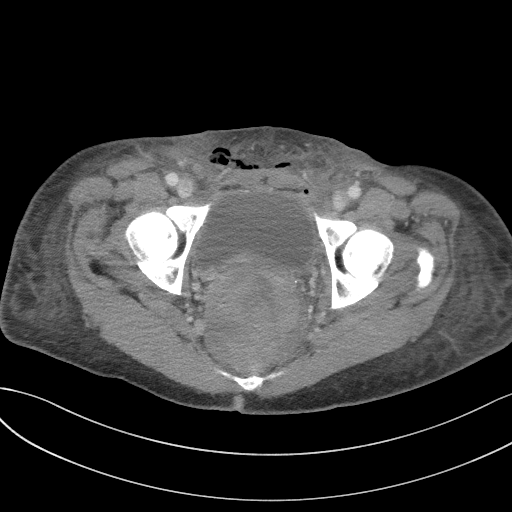
[im 33/99  soft-tissue]
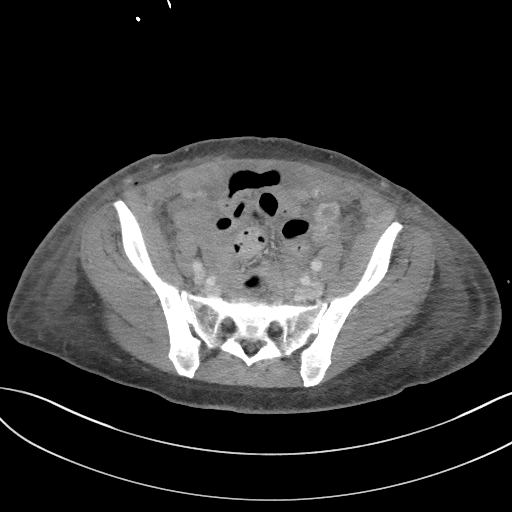
[im 40/99  soft-tissue]
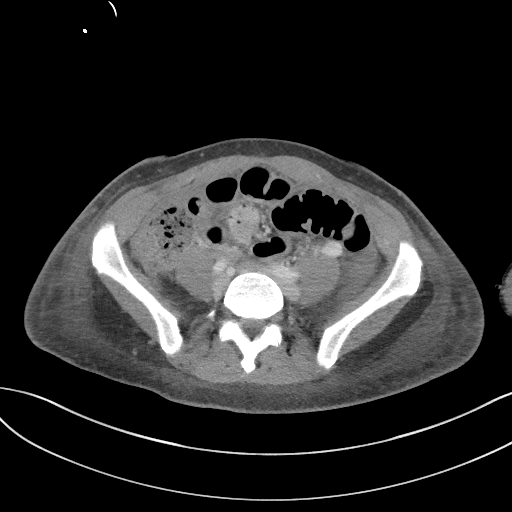
[im 46/99  soft-tissue]
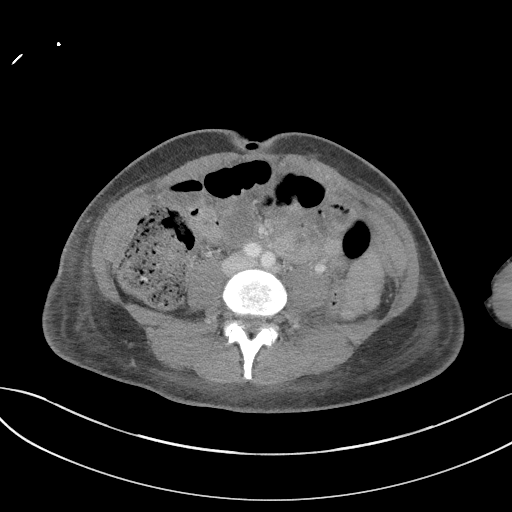
[im 53/99  soft-tissue]
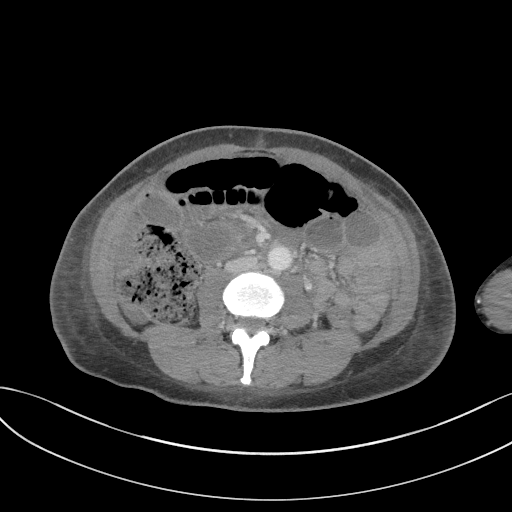
[im 59/99  soft-tissue]
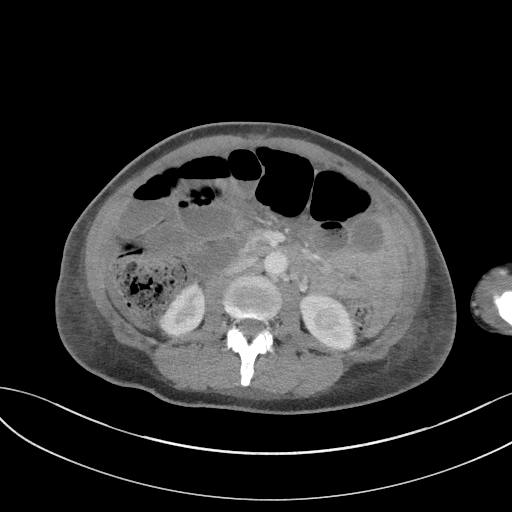
[im 66/99  soft-tissue]
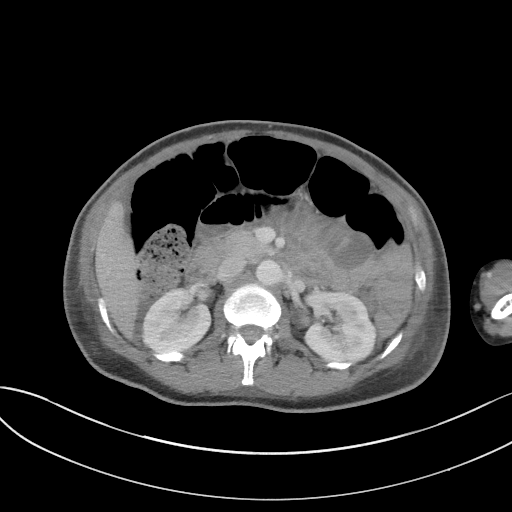
[im 66/99  bone]
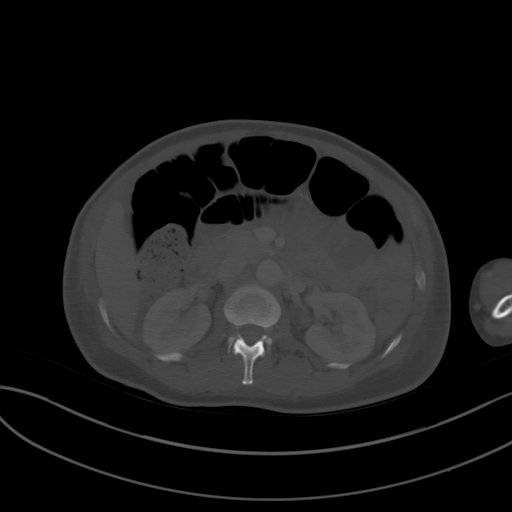
[im 79/99  soft-tissue]
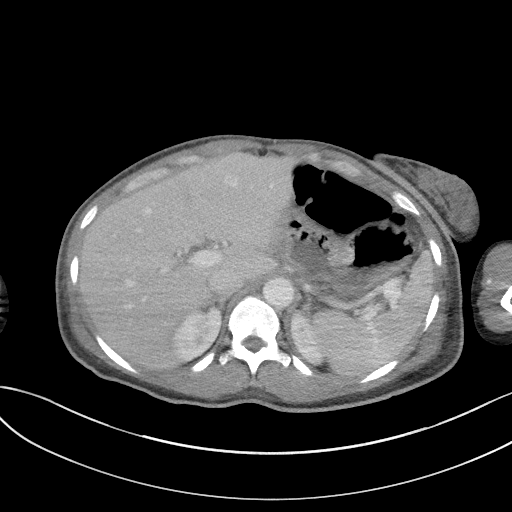
[im 85/99  soft-tissue]
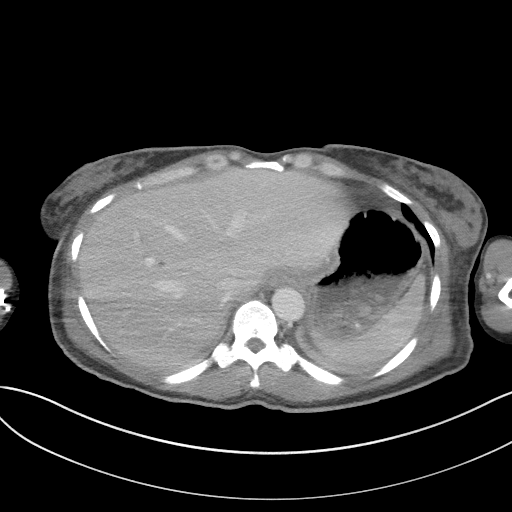
[im 92/99  soft-tissue]
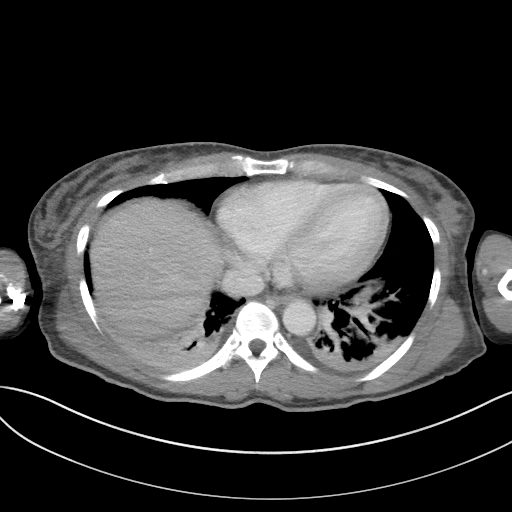

[Series 5: coronal st · coronal · 0.70mm/px · 3 of 73 slices shown]
[im 25/73  soft-tissue]
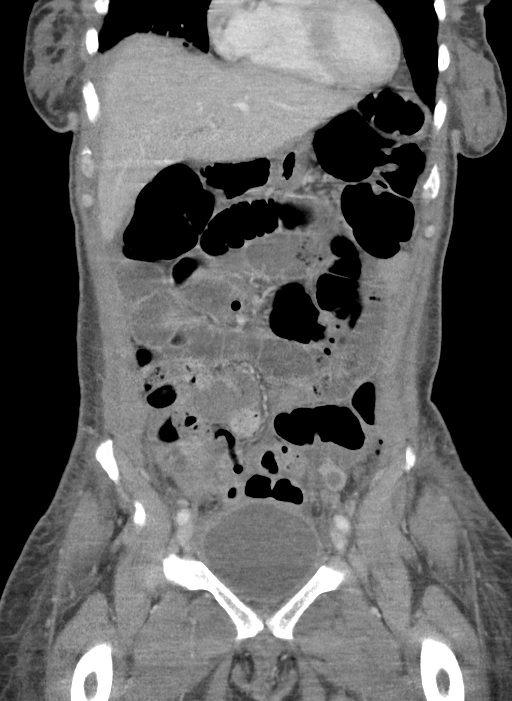
[im 33/73  soft-tissue]
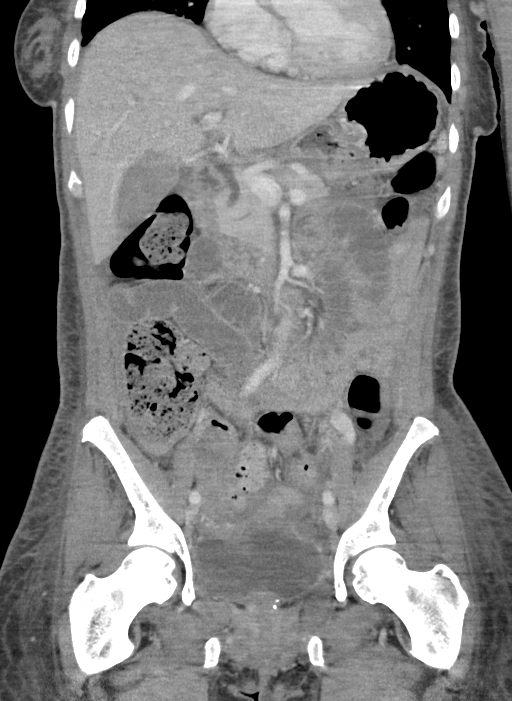
[im 41/73  soft-tissue]
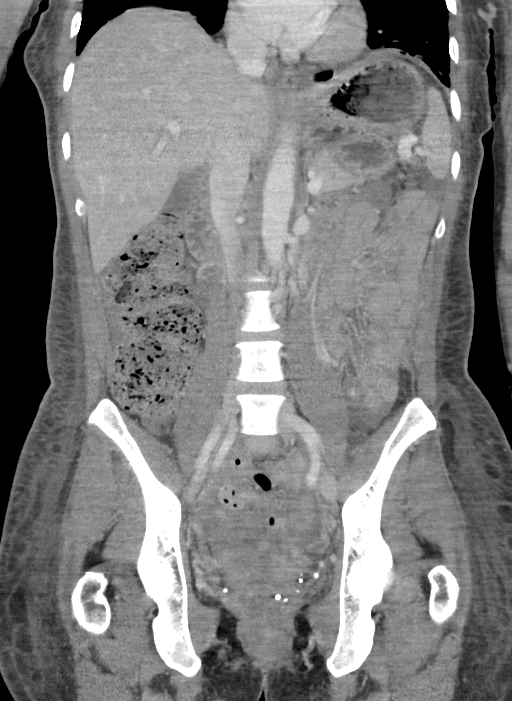

[15 of 46 positions shown; findings below may reference images not displayed]

FINDINGS: Lower chest: Hypoventilatory changes are seen at the lung bases.

Hepatobiliary: High attenuation material within the gallbladder may
reflect sludge or vicarious excretion of previously administered
contrast. No evidence of cholecystitis. The liver is unremarkable.

Pancreas: Unremarkable. No pancreatic ductal dilatation or
surrounding inflammatory changes.

Spleen: Normal in size without focal abnormality.

Adrenals/Urinary Tract: The kidneys enhance normally and
symmetrically. No evidence of urinary tract calculi or obstruction.

There is segmental opacification of the ureters on delayed
evaluation, with no evidence of ureteral injury. The bladder is
moderately distended, with small amount of gas in the bladder lumen
likely from recent catheterization.

The adrenals are unremarkable.

Stomach/Bowel: Multiple loops of distended small bowel measuring up
to 2.7 cm in diameter, with multiple gas fluid levels. This is
primarily within the proximal to mid jejunum. The distal jejunum and
ileum are somewhat decompressed, and findings are consistent with
developing obstruction or segmental ileus. Gas and stool are seen
within the colon to the level of the rectum.

Vascular/Lymphatic: Minimal atherosclerosis of the aorta. No
pathologic adenopathy.

Reproductive: Postsurgical changes from abdominal hysterectomy.
Normal follicles are seen within the left ovary. The right ovary is
not well visualized.

Other: There is a small amount of fluid throughout the abdomen and
lower pelvis, compatible with recent hysterectomy. I do not see any
evidence of active hemorrhage or contrast extravasation.

Small foci of free gas in the abdomen are consistent with recent
surgical intervention. There is subcutaneous gas within the lower
anterior abdominal wall at the site of low transverse incision.

Minimal interstitial fluid within the lower anterior abdominal wall.
No evidence of hematoma or abscess. No abdominal wall hernia.

Musculoskeletal: There are no acute or destructive bony lesions.
Reconstructed images demonstrate no additional findings.
IMPRESSION: 1. Postsurgical changes from abdominal hysterectomy, with small
amount of free fluid and free gas throughout the abdomen and pelvis
compatible with recent surgical intervention. No evidence of acute
hemorrhage.
2. Proximal small bowel dilation and gas fluid levels, favor
developing obstruction over ileus. Transition to normal caliber
within the mid to distal jejunum and ileum.
3. High attenuation material within the gallbladder consistent with
sludge or vicarious excretion of contrast. No evidence of
cholecystitis.
4.  Aortic Atherosclerosis ([FP]-[FP]).

## 2020-10-20 MED ORDER — LORAZEPAM 2 MG/ML IJ SOLN
1.0000 mg | Freq: Four times a day (QID) | INTRAMUSCULAR | Status: DC | PRN
  Administered 2020-10-20: 1 mg via INTRAVENOUS
  Filled 2020-10-20: qty 1

## 2020-10-20 MED ORDER — PANTOPRAZOLE SODIUM 40 MG IV SOLR
40.0000 mg | Freq: Two times a day (BID) | INTRAVENOUS | Status: DC
  Administered 2020-10-20 – 2020-10-24 (×8): 40 mg via INTRAVENOUS
  Filled 2020-10-20 (×11): qty 40

## 2020-10-20 MED ORDER — ACETAMINOPHEN 500 MG PO TABS
1000.0000 mg | ORAL_TABLET | Freq: Four times a day (QID) | ORAL | Status: DC
Start: 2020-10-20 — End: 2020-10-20
  Administered 2020-10-20: 1000 mg via ORAL
  Filled 2020-10-20: qty 2

## 2020-10-20 MED ORDER — CHEWING GUM (ORBIT) SUGAR FREE
1.0000 | CHEWING_GUM | ORAL | Status: DC | PRN
  Filled 2020-10-20 (×2): qty 1

## 2020-10-20 MED ORDER — IOHEXOL 300 MG/ML  SOLN
100.0000 mL | Freq: Once | INTRAMUSCULAR | Status: AC | PRN
  Administered 2020-10-20: 100 mL via INTRAVENOUS

## 2020-10-20 MED ORDER — PHENOL 1.4 % MT LIQD
1.0000 | OROMUCOSAL | Status: DC | PRN
  Filled 2020-10-20: qty 177

## 2020-10-20 MED ORDER — SODIUM CHLORIDE 0.9 % IV SOLN: INTRAVENOUS | Status: DC

## 2020-10-20 MED ORDER — HYDROMORPHONE 1 MG/ML IV SOLN
INTRAVENOUS | Status: DC
  Filled 2020-10-20: qty 25

## 2020-10-20 MED ORDER — ACETAMINOPHEN 500 MG PO TABS
1000.0000 mg | ORAL_TABLET | Freq: Four times a day (QID) | ORAL | Status: AC
  Administered 2020-10-21 – 2020-10-24 (×11): 1000 mg via ORAL
  Filled 2020-10-20 (×11): qty 2

## 2020-10-20 MED ORDER — OXYCODONE HCL 5 MG PO TABS
5.0000 mg | ORAL_TABLET | ORAL | Status: DC | PRN
  Administered 2020-10-20: 10 mg via ORAL
  Filled 2020-10-20: qty 2

## 2020-10-20 MED ORDER — ACETAMINOPHEN 10 MG/ML IV SOLN
1000.0000 mg | Freq: Four times a day (QID) | INTRAVENOUS | Status: AC
  Administered 2020-10-20 – 2020-10-21 (×3): 1000 mg via INTRAVENOUS
  Filled 2020-10-20 (×4): qty 100

## 2020-10-20 MED ORDER — HYDROMORPHONE HCL 1 MG/ML IJ SOLN
1.0000 mg | INTRAMUSCULAR | Status: DC | PRN
  Administered 2020-10-21 (×2): 1 mg via INTRAVENOUS
  Filled 2020-10-20 (×2): qty 1

## 2020-10-20 MED ORDER — KETOROLAC TROMETHAMINE 30 MG/ML IJ SOLN
30.0000 mg | Freq: Three times a day (TID) | INTRAMUSCULAR | Status: AC
  Administered 2020-10-20 – 2020-10-22 (×6): 30 mg via INTRAVENOUS
  Filled 2020-10-20 (×6): qty 1

## 2020-10-20 NOTE — Progress Notes (Addendum)
Attempted to document Q4 assessments for the dilaudid PCA pump via the [facility] and noticed the order was placed incorrectly. Pharmacy was called but unable to change custom order and will notify manager. Will continue to document dilaudid volume, RASS score, pain scale, 02 sats, EtCO2 and RR Q4 hrs in flowsheet.

## 2020-10-20 NOTE — Progress Notes (Signed)
Pt with significantly more pain today, not tolerating food but is tolerating liquids. OOB to bathroom with significant abdominal pain. +peritoneal signs, not distended.  S/p 3 u blood overnight Vitals:   10/20/20 1131 10/20/20 1139  BP:  118/74  Pulse:  77  Resp:  16  Temp:  98.4 F (36.9 C)  SpO2: 92% 90%   Abd: dressing c/d/i. Rigid with breaths, not distended. Urine output adequate.  - repeat cbc now - CT abd/pelvis. I dont' think she'll tolerate po contrast. Will get IV. -start po percocet

## 2020-10-20 NOTE — Progress Notes (Signed)
CT returned with postop ileus, no abdominal blood. H/H returned appropriate bump. Dressing c/d/i. No nausea or vomiting. +passing gas yesterday, but none today. No bowel movement. No fever. Tolerating clears but not hungry.   CT scan abd/pelvis:   IMPRESSION: 1. Postsurgical changes from abdominal hysterectomy, with small amount of free fluid and free gas throughout the abdomen and pelvis compatible with recent surgical intervention. No evidence of acute hemorrhage. 2. Proximal small bowel dilation and gas fluid levels, favor developing obstruction over ileus. Transition to normal caliber within the mid to distal jejunum and ileum. 3. High attenuation material within the gallbladder consistent with sludge or vicarious excretion of contrast. No evidence of cholecystitis.  Pulm: CTAB Abd: superior portion with minimal distension, no bowel sounds, no rebound tenderness, minimally rigid now with much less voluntary guarding. Dressing c/d/i  A/P: Postop ileus:  - NPO, sips with oxycodone PRN -iv acetaminophen, iv toradol -OOB to bathroom - place NG tube tonight, reassess tomorrow. Restart IVF NS, all meds by iv. Continue Protonix.   Hypoxia: - 2L by  to keep O2sats >92% per pulm  Anxiety: -iv ativan PRN

## 2020-10-20 NOTE — Progress Notes (Signed)
3 units of RBC's complete, pt tolerated well. VSS.

## 2020-10-20 NOTE — Progress Notes (Signed)
BRIEF PCCM NOTE  Pt is hemodynamically stable and has been weaned to 2L Nasal Cannula.  Latest Vitals: temperature 98 F, RR 18, pulse 88, BP 115/84, and SpO2 100% on 2L.  Hemoglobin has improved to 8.9 post pRBC transfusions.  Suspect pt's Acute Respiratory Distress yesterday was likely secondary to reactive airway exacerbated by significant pain with ambulation and activity, along with atelectasis.  Assessment & Plan:   Acute hypoxic respiratory failure >>improved Hx: Asthma -Supplemental O2 as needed to maintain O2 sats greater than 92% -Follow intermittent chest x-ray and ABG as needed -Chest x-ray on 2/9 is negative for any acute cardiopulmonary process -CTA chest on 2/9 is negative for pulmonary embolism, does show bibasilar areas of atelectasis with right greater than left -As needed bronchodilators -Incentive Spirometry & Flutter valve   Transient hypotension, suspect orthostatic hypotension>>resolved -Maintain MAP greater than 65 -IV fluids -Check CBC and troponin ~ Hgb 5.6, to receive 2 units pRBCs   Acute Blood Loss Anemia>>improved -Monitor for S/Sx of bleeding -Trend CBC ~ Hgb 5.6, to receive 2 units pRBCs ~ Follow up Hemoglobin is 8.9 -SCD's for VTE Prophylaxis (resume chemical prophylaxis per OB/GYN due to recent surgery) -Transfuse for Hgb <7      PCCM will sign off at this time.  Please re-consult if we can be of any further assistance.     Harlon Ditty, AGACNP-BC [facility] Pager: [phone]

## 2020-10-20 NOTE — Progress Notes (Signed)
Attempted NG tube placement x 4. Pt refused to let me try her right nare, only the left. 58f and 47fr tubes used. Pt is refusing another attempt. MD Dalbert Garnet notified.No new orders at this time. Pt Is not vomiting or having severe nausea at the present moment. Will continue to monitor symptoms. Pt ambulated in halls (about 30 feet). Did not tolerate activity well. Increased pain. Pt states she will attempt to get oob in am as well.

## 2020-10-21 LAB — BPAM RBC
Blood Product Expiration Date: 202203112359
Blood Product Expiration Date: 202203162359
Blood Product Expiration Date: 202203162359
ISSUE DATE / TIME: 202202091950
ISSUE DATE / TIME: 202202092249
ISSUE DATE / TIME: 202202100336
Unit Type and Rh: 5100
Unit Type and Rh: 5100
Unit Type and Rh: 5100

## 2020-10-21 LAB — COMPREHENSIVE METABOLIC PANEL
ALT: 16 U/L (ref 0–44)
AST: 24 U/L (ref 15–41)
Albumin: 3.4 g/dL — ABNORMAL LOW (ref 3.5–5.0)
Alkaline Phosphatase: 38 U/L (ref 38–126)
Anion gap: 14 (ref 5–15)
BUN: 6 mg/dL (ref 6–20)
CO2: 22 mmol/L (ref 22–32)
Calcium: 9.1 mg/dL (ref 8.9–10.3)
Chloride: 104 mmol/L (ref 98–111)
Creatinine, Ser: 0.41 mg/dL — ABNORMAL LOW (ref 0.44–1.00)
GFR, Estimated: >60 mL/min (ref >60)
Glucose, Bld: 68 mg/dL — ABNORMAL LOW (ref 70–99)
Potassium: 4.2 mmol/L (ref 3.5–5.1)
Sodium: 140 mmol/L (ref 135–145)
Total Bilirubin: 0.6 mg/dL (ref 0.3–1.2)
Total Protein: 6.5 g/dL (ref 6.5–8.1)

## 2020-10-21 LAB — TYPE AND SCREEN
ABO/RH(D): O POS
Antibody Screen: NEGATIVE
Unit division: 0
Unit division: 0
Unit division: 0

## 2020-10-21 LAB — CBC
HCT: 28.4 % — ABNORMAL LOW (ref 36.0–46.0)
Hemoglobin: 9.5 g/dL — ABNORMAL LOW (ref 12.0–15.0)
MCH: 32 pg (ref 26.0–34.0)
MCHC: 33.5 g/dL (ref 30.0–36.0)
MCV: 95.6 fL (ref 80.0–100.0)
Platelets: 261 10*3/uL (ref 150–400)
RBC: 2.97 MIL/uL — ABNORMAL LOW (ref 3.87–5.11)
RDW: 14.1 % (ref 11.5–15.5)
WBC: 7.6 10*3/uL (ref 4.0–10.5)
nRBC: 0 % (ref 0.0–0.2)

## 2020-10-21 MED ORDER — GABAPENTIN 600 MG PO TABS
300.0000 mg | ORAL_TABLET | Freq: Two times a day (BID) | ORAL | Status: DC
  Administered 2020-10-21 – 2020-10-24 (×6): 300 mg via ORAL
  Filled 2020-10-21 (×7): qty 0.5

## 2020-10-21 MED ORDER — ACETAMINOPHEN 500 MG PO TABS: 1000.0000 mg | ORAL_TABLET | Freq: Four times a day (QID) | ORAL | Status: DC | PRN

## 2020-10-21 MED ORDER — NIFEDIPINE ER OSMOTIC RELEASE 30 MG PO TB24
30.0000 mg | ORAL_TABLET | Freq: Every day | ORAL | Status: DC
  Administered 2020-10-21 – 2020-10-24 (×4): 30 mg via ORAL
  Filled 2020-10-21 (×4): qty 1

## 2020-10-21 MED ORDER — OXYCODONE HCL 5 MG PO TABS
10.0000 mg | ORAL_TABLET | ORAL | Status: DC | PRN
  Administered 2020-10-21 – 2020-10-22 (×7): 10 mg via ORAL
  Administered 2020-10-22: 5 mg via ORAL
  Administered 2020-10-23 – 2020-10-24 (×9): 10 mg via ORAL
  Filled 2020-10-21 (×17): qty 2

## 2020-10-21 NOTE — Progress Notes (Signed)
   10/21/20 1515  Clinical Encounter Type  Visited With Patient  Visit Type Follow-up;Spiritual support;Social support  Referral From Nurse  Consult/Referral To Chaplain  Spiritual Encounters  Spiritual Needs Prayer  Chaplain Domingue Coltrain visited Pt in room [address]A, [facility]er[facility]. I offered ministry of presence, a listening ear and I ended my visited with a word of prayer requested by the patient.

## 2020-10-21 NOTE — Progress Notes (Signed)
3 Days Post-Op Procedure(s) (LRB): HYSTERECTOMY ABDOMINAL WITH SALPINGECTOMY (Bilateral) Pod#4 abd pain and transient desat . Dr Dalbert Garnet gave me a full update yesterday . Pt still required dilaudid last night and today . NGT not placed due to multiple tries .  + flatus ,   Patient reports .has an appetite, starting to feel abit better  Objective:VSS WDWN B female in NAD  I have reviewed patient's notes and labs Lungs : Rhonchi right base CV RRR ABD: + hyperactive BS,  Appropriate TTP   Assessment: s/p Procedure(s): HYSTERECTOMY ABDOMINAL WITH SALPINGECTOMY (Bilateral):  Pain still an issue , but improving  Atelectasis right lung  Plan: Restart PO diet liquids  Saline lock IV  PO percocet + iv toradol  anbulation Incentive spirometer  Ihor Austin Lanier Felty 10/21/2020, 6:58 PM

## 2020-10-22 LAB — URINALYSIS, ROUTINE W REFLEX MICROSCOPIC
Bacteria, UA: NONE SEEN
Bilirubin Urine: NEGATIVE
Glucose, UA: NEGATIVE mg/dL
Ketones, ur: 5 mg/dL — AB
Leukocytes,Ua: NEGATIVE
Nitrite: NEGATIVE
Protein, ur: NEGATIVE mg/dL
Specific Gravity, Urine: 1.014 (ref 1.005–1.030)
pH: 7 (ref 5.0–8.0)

## 2020-10-22 NOTE — Progress Notes (Signed)
Dr Elesa Massed at [facility], Assessment completed. Back pain assessed. Honeycomb dressing removed by Dr Elesa Massed, and abd binder applied. Dr Elesa Massed encouraged ambulation and changed diet to regular and encouraged progress toward discharge. Pt verbalizes understanding.

## 2020-10-22 NOTE — Progress Notes (Signed)
U/A results reported to Dr. Elesa Massed. No new orders received.

## 2020-10-23 LAB — COMPREHENSIVE METABOLIC PANEL
ALT: 17 U/L (ref 0–44)
AST: 21 U/L (ref 15–41)
Albumin: 3.6 g/dL (ref 3.5–5.0)
Alkaline Phosphatase: 43 U/L (ref 38–126)
Anion gap: 10 (ref 5–15)
BUN: 7 mg/dL (ref 6–20)
CO2: 25 mmol/L (ref 22–32)
Calcium: 9.1 mg/dL (ref 8.9–10.3)
Chloride: 100 mmol/L (ref 98–111)
Creatinine, Ser: 0.5 mg/dL (ref 0.44–1.00)
GFR, Estimated: >60 mL/min (ref >60)
Glucose, Bld: 99 mg/dL (ref 70–99)
Potassium: 3.7 mmol/L (ref 3.5–5.1)
Sodium: 135 mmol/L (ref 135–145)
Total Bilirubin: 0.4 mg/dL (ref 0.3–1.2)
Total Protein: 6.8 g/dL (ref 6.5–8.1)

## 2020-10-23 LAB — CBC
HCT: 30.4 % — ABNORMAL LOW (ref 36.0–46.0)
Hemoglobin: 10.3 g/dL — ABNORMAL LOW (ref 12.0–15.0)
MCH: 32 pg (ref 26.0–34.0)
MCHC: 33.9 g/dL (ref 30.0–36.0)
MCV: 94.4 fL (ref 80.0–100.0)
Platelets: 304 10*3/uL (ref 150–400)
RBC: 3.22 MIL/uL — ABNORMAL LOW (ref 3.87–5.11)
RDW: 13.6 % (ref 11.5–15.5)
WBC: 5.6 10*3/uL (ref 4.0–10.5)
nRBC: 0 % (ref 0.0–0.2)

## 2020-10-23 MED ORDER — SODIUM CHLORIDE (PF) 0.9 % IJ SOLN
INTRAMUSCULAR | Status: AC
  Administered 2020-10-23: 9 mL via INTRAVENOUS
  Filled 2020-10-23: qty 10

## 2020-10-23 MED ORDER — BISACODYL 10 MG RE SUPP
10.0000 mg | Freq: Every day | RECTAL | Status: DC | PRN
  Administered 2020-10-23: 10 mg via RECTAL
  Filled 2020-10-23: qty 1

## 2020-10-23 MED ORDER — ONDANSETRON 4 MG PO TBDP
4.0000 mg | ORAL_TABLET | Freq: Three times a day (TID) | ORAL | Status: DC | PRN
  Filled 2020-10-23: qty 1

## 2020-10-23 NOTE — Progress Notes (Addendum)
4 Days Post-Op Procedure(s) (LRB): HYSTERECTOMY ABDOMINAL WITH SALPINGECTOMY (Bilateral)  Still having pain, describes more now in her bilateral back, and is straining to move.  She ambulated to the end of the hall today, but still stays mostly in hospital bed.  +flatus, Voiding, tolerating pain with PO meds only, and tolerating a clear diet.  Denies: CP SOB F/C, N/V, calf pain  Using incentive spirometer often.    Objective:VSS WDWN B female in NAD  I have reviewed patient's notes and labs Lungs : clear bilaterally CV RRR ABD: + hyperactive BS,  Appropriate TTP  Honeycomb dressing removed, incision c/d/i with steri strips.  Abdominal binder placed across incision.  Assessment: s/p Procedure(s): HYSTERECTOMY ABDOMINAL WITH SALPINGECTOMY (Bilateral):  Pain still an issue , but improving    Plan: Advance to soft diet Saline lock IV PO oxycodone PRN and scheduled Tylenol and gabapentin. nsaids contraindicated due to gastric ulcer Sit in chair when awake, not lay in bed.  Ambulate as much as possible.  Push! Incentive spirometer Has met initial goals for discharge but pain not yet under control nor ambulating without assistance. Continue inpatient management.  ----- Larey Days, MD, [place] Attending Obstetrician and Gynecologist [facility], Department of [facility]

## 2020-10-23 NOTE — Progress Notes (Signed)
5 Days Post-Op Procedure(s) (LRB): HYSTERECTOMY ABDOMINAL WITH SALPINGECTOMY (Bilateral)  Pain much improved with activity.  She has been walking the floor, still with assist and some apprehension, but improving.  Feels like she needs help having a BM, and that she would feel better if she could.  Takes Miralax at home and this was given overnight.    Back pain largely resolved due to sitting in chair most of the day and not laying down.   +flatus, tolerating regular diet, ambulating and voiding spontaneously.  Denies: CP SOB F/C, N/V, calf pain  Using incentive spirometer often.    Objective:VSS WDWN B female in NAD  I have reviewed patient's notes and labs Lungs : clear bilaterally CV RRR ABD: normal BS,  Appropriate TTP  UA not suspicious for UTI.    Assessment: s/p Procedure(s): HYSTERECTOMY ABDOMINAL WITH SALPINGECTOMY (Bilateral):  Pain still an issue , but improving    Plan: Advance to soft diet Saline lock IV PO oxycodone PRN and scheduled Tylenol  PO nsaids contraindicated due to gastric ulcer Sit in chair when awake, not lay in bed.  Ambulate as much as possible.  Abdominal binder Incentive spirometer Has met initial goals for discharge but pain not yet under control nor ambulating without assistance. Continue inpatient management, anticipate discharge Monday.  Dr. Leafy Ro updated.  ----- Larey Days, MD, [place] Attending Obstetrician and Gynecologist [facility], Department of [facility]

## 2020-10-24 ENCOUNTER — Other Ambulatory Visit (HOSPITAL_COMMUNITY): Payer: Self-pay | Admitting: Obstetrics & Gynecology

## 2020-10-24 DIAGNOSIS — R0902 Hypoxemia: Secondary | ICD-10-CM

## 2020-10-24 DIAGNOSIS — N809 Endometriosis, unspecified: Secondary | ICD-10-CM

## 2020-10-24 DIAGNOSIS — D62 Acute posthemorrhagic anemia: Secondary | ICD-10-CM

## 2020-10-24 MED ORDER — GABAPENTIN 300 MG PO CAPS: 300.0000 mg | ORAL_CAPSULE | Freq: Two times a day (BID) | ORAL | Status: DC

## 2020-10-24 MED ORDER — ONDANSETRON 4 MG PO TBDP: ORAL_TABLET | ORAL | 0 refills | Status: DC

## 2020-10-24 MED ORDER — BISACODYL 10 MG RE SUPP: 10.0000 mg | Freq: Every day | RECTAL | 0 refills | Status: AC | PRN

## 2020-10-24 MED ORDER — SODIUM CHLORIDE (PF) 0.9 % IJ SOLN
INTRAMUSCULAR | Status: AC
  Administered 2020-10-24: 10 mL
  Filled 2020-10-24: qty 10

## 2020-10-24 MED ORDER — OXYCODONE HCL 5 MG PO TABS: 5.0000 mg | ORAL_TABLET | ORAL | 0 refills | Status: AC | PRN

## 2020-10-24 MED ORDER — TURMERIC 500 MG PO CAPS: 450.0000 mg | ORAL_CAPSULE | Freq: Two times a day (BID) | ORAL | 0 refills | Status: AC

## 2020-10-24 MED ORDER — NIFEDIPINE ER 30 MG PO TB24: 30.0000 mg | ORAL_TABLET | Freq: Every day | ORAL | 2 refills | Status: DC

## 2020-10-24 MED ORDER — ACETAMINOPHEN 500 MG PO TABS: 1000.0000 mg | ORAL_TABLET | Freq: Four times a day (QID) | ORAL | 1 refills | Status: AC

## 2020-10-24 NOTE — Progress Notes (Signed)
Secured Chat sent to Dr. Elesa Massed and I reported Pt. Temp. Is 100.2 despite Q6h Tylenol.

## 2020-10-24 NOTE — Discharge Instructions (Signed)
Abdominal Hysterectomy, Care After The following information offers guidance on how to care for yourself after your procedure. Your health care provider may also give you more specific instructions. If you have problems or questions, contact your health care provider. What can I expect after the procedure? After the procedure, it is common to have:  Pain.  Tiredness (fatigue).  Poor appetite.  Less interest in sex.  Vaginal bleeding and discharge. You may need to use a sanitary napkin after this procedure.  Constipation.  Feelings of sadness or other emotions. Follow these instructions at home: Medicines  Take over-the-counter and prescription medicines only as told by your health care provider.  Do not take aspirin or NSAIDs, such as ibuprofen. These medicines can cause bleeding.  If you were prescribed an antibiotic medicine, take it as told by your health care provider. Do not stop using the antibiotic even if you start to feel better.  Ask your health care provider if the medicine prescribed to you: ? Requires you to avoid driving or using machinery. ? Can cause constipation. You may need to take these actions to prevent or treat constipation:  Drink enough fluid to keep your urine pale yellow.  Take over-the-counter or prescription medicines.  Eat foods that are high in fiber, such as beans, whole grains, and fresh fruits and vegetables.  Limit foods that are high in fat and processed sugars, such as fried or sweet foods. Incision care  Follow instructions from your health care provider about how to take care of your incision. Make sure you: ? Wash your hands with soap and water for at least 20 seconds before and after you change your bandage (dressing). If soap and water are not available, use hand sanitizer. ? Change your dressing as told by your health care provider. ? Leave stitches (sutures), skin glue, or adhesive strips in place. These skin closures may need to  stay in place for 2 weeks or longer. If adhesive strip edges start to loosen and curl up, you may trim the loose edges. Do not remove adhesive strips completely unless your health care provider tells you to do that.  Keep the dressing dry until your health care provider says it can be removed.  Check your incision area every day for signs of infection. Check for: ? More redness, swelling, or pain. ? Fluid or blood. ? Warmth. ? Pus or a bad smell.   Activity  Rest as told by your health care provider.  Avoid sitting for a long time without moving. Get up to take short walks every 1-2 hours. This is important to improve blood flow and breathing. Ask for help if you feel weak or unsteady.  Do not lift anything that is heavier than 10 lb (4.5 kg), or the limit that you are told, until your health care provider says that it is safe.  Follow instructions from your health care provider about exercise, driving, and general activities.  Return to your normal activities as told by your health care provider. Ask your health care provider what activities are safe for you.   Lifestyle  Do not douche, use tampons, or have sex for at least 6 weeks or as told by your health care provider.  Do not drink alcohol until your health care provider approves.  Do not use any products that contain nicotine or tobacco. These products include cigarettes, chewing tobacco, and vaping devices, such as e-cigarettes. These can delay healing after surgery. If you need help quitting, ask  your health care provider. General instructions  If you struggle with physical or emotional changes after your procedure, speak with your health care provider or a therapist.  Do not take baths, swim, or use a hot tub until your health care provider approves. Ask your health care provider if you may take showers. You may only be allowed to take sponge baths.  Try to have a responsible adult at home with you for the first 1-2 weeks to  help with your daily chores.  Wear compression stockings as told by your health care provider. These stockings help to prevent blood clots and reduce swelling in your legs.  Keep all follow-up visits. This is important. Contact a health care provider if:  You have any of these signs of infection: ? Chills or a fever. ? More redness, swelling, warmth, or pain around your incision. ? Fluid or blood coming from your incision. ? Pus or a bad smell coming from your incision.  Your incision opens.  You feel dizzy or light-headed.  You have pain or bleeding when you urinate.  You have diarrhea that does not go away.  You have nausea and vomiting that do not go away.  You have pus, or a bad-smelling discharge coming from your vagina.  You have any type of abnormal reaction such as a rash, or you develop an allergy to your medicine.  Your pain medicine does not help. Get help right away if:  You have a fever and your symptoms suddenly get worse.  You have severe pain in your abdomen.  You have shortness of breath.  You faint.  You have pain, swelling, or redness in your leg.  You have heavy vaginal bleeding and blood clots, soaking through a sanitary napkin in less than 1 hour. These symptoms may represent a serious problem that is an emergency. Do not wait to see if the symptoms will go away. Get medical help right away. Call your local emergency services (911 in the [place] Do not drive yourself to the hospital. Summary  After your procedure, it is common to have pain, tiredness, and vaginal discharge.  Do not lift anything that is heavier than 10 lb (4.5 kg), or the limit that you are told, until your health care provider says that it is safe.  Follow instructions from your health care provider about exercise, driving, and general activities. Ask what activities are safe for you.  Do not take baths, swim, or use a hot tub until your health care provider approves. Ask your  health care provider if you may take showers. You may only be allowed to take sponge baths.  Try to have a responsible adult at home with you for the first 1-2 weeks to help with your daily chores. This information is not intended to replace advice given to you by your health care provider. Make sure you discuss any questions you have with your health care provider. Document Revised: 04/28/2020 Document Reviewed: 04/28/2020 Elsevier Patient Education  2021 [employer]. For the next three days, take ibuprofen (if you can) and acetaminophen on a schedule, every 8 hours. You can take them together or you can intersperse them, and take one every four hours. I also gave you gabapentin for nighttime, to help you sleep and also to control pain. Take gabapentin medicines at night for at least the next 3 nights. You also have a narcotic, oxycodone, to take as needed if the above medicines don't help.  Postop constipation is a major  cause of pain. Stay well hydrated, walk as you tolerate, and take over the counter senna as well as stool softeners if you need them.   Signs and Symptoms to Report Call our office at [phone] if you have any of the following.  . Fever over 100.4 degrees or higher . Severe stomach pain not relieved with pain medications . Bright red bleeding that's heavier than a period that does not slow with rest . To go the bathroom a lot (frequency), you can't hold your urine (urgency), or it hurts when you empty your bladder (urinate) . Chest pain . Shortness of breath . Pain in the calves of your legs . Severe nausea and vomiting not relieved with anti-nausea medications . Signs of infection around your wounds, such as redness, hot to touch, swelling, green/yellow drainage (like pus), bad smelling discharge . Any concerns  What You Can Expect after Surgery . You may see some pink tinged, bloody fluid and bruising around the wound. This is normal. . You may have a sore  throat because of the tube in your mouth during general anesthesia. This will go away in 2 to 3 days. . You may have some stomach cramps. . You may notice spotting on your panties. . You may have pain around the incision sites.   Activities after Your Discharge Follow these guidelines to help speed your recovery at home: . Do the coughing and deep breathing as you did in the hospital for 2 weeks. Use the small blue breathing device, called the incentive spirometer for 2 weeks. . Don't drive if you are in pain or taking narcotic pain medicine. You may drive when you can safely slam on the brakes, turn the wheel forcefully, and rotate your torso comfortably. This is typically 1-2 weeks. Practice in a parking lot or side street prior to attempting to drive regularly.  . Ask others to help with household chores for 4 weeks. . Do not lift anything heavier that 10 pounds for 4-6 weeks. This includes pets, children, and groceries. . Don't do strenuous activities, exercises, or sports like vacuuming, tennis, squash, etc. until your doctor says it is safe to do so. ---If you had a hysterectomy (abdominal, laparoscopic, or vaginal) do not have intercourse for 8-10 weeks.  . Walk as you feel able. Rest often since it may take two or three weeks for your energy level to return to normal.  . You may climb stairs . Avoid constipation:   -Eat fruits, vegetables, and whole grains. Eat small meals as your appetite will take time to return to normal.   -Drink 6 to 8 glasses of water each day unless your doctor has told you to limit your fluids.   -Use a laxative or stool softener as needed if constipation becomes a problem. You may take Miralax, metamucil, Citrucil, Colace, Senekot, FiberCon, etc. If this does not relieve the constipation, try two tablespoons of Milk Of Magnesia every 8 hours until your bowels move.  . You may shower. Gently wash the wounds with a mild soap and water. Pat dry. . Do not get in a  hot tub, swimming pool, etc. until your doctor agrees. . Do not use lotions, oils, powders on the wounds. . Do not douche, use tampons, or have sex until your doctor says it is okay. . Take your pain medicine when you need it. The medicine may not work as well if the pain is bad.  Take the medicines you were taking before  surgery. Other medications you will need are pain medications (oxycodone) and nausea medications (Zofran).

## 2020-10-24 NOTE — Consult Note (Signed)
Brief Pharmacy Note  Pharmacy has been consulted for our anti-hypertensive meds to beds program.   Patient has agreed to the program, and is aware she will be responsible for her regular outpatient prescription copay.   The patient's preferred pharmacy in Epic has been changed to [facility] employee pharmacy (so the electronic prescriptions can be sent there instead of their prior designated pharmacy.)    The electronic rx must be sent to employee pharmacy by 4:30pm the day of discharge for the meds to beds delivery to be possible.  Thank you for including pharmacy in this patient's care.  Albina Billet, Clinical Pharmacist 10/24/2020 1:16 PM

## 2020-10-24 NOTE — Discharge Summary (Signed)
Gynecology Discharge Summary  Patient ID: Shirley [name] MRN: [id number] DOB/AGE: 1976/09/03 45 y.o.  Admit Date: 10/18/2020 Discharge Date: 10/24/2020  Preoperative Diagnoses: endometrial mass, pelvic pain, suspected hematometra  Postoperative Diagnoses: same with significant pelvic adhesive disease, endometriosis   Procedures: Procedure(s) (LRB): HYSTERECTOMY ABDOMINAL WITH SALPINGECTOMY (Bilateral)  CBC Latest Ref Rng & Units 10/23/2020 10/21/2020 10/20/2020  WBC 4.0 - 10.5 K/uL 5.6 7.6 7.5  Hemoglobin 12.0 - 15.0 g/dL 10.3(L) 9.5(L) 9.9(L)  Hematocrit 36.0 - 46.0 % 30.4(L) 28.4(L) 29.2(L)  Platelets 150 - 400 K/uL 304 261 252    Hospital Course:  Shirley [name] is a 45 y.o. [id number]  admitted for scheduled surgery.  She underwent the procedures as mentioned above, her operation was uncomplicated. For further details about surgery, please refer to the operative report. Patient had a post operative course complicated by several issues: -hypoxic event with O2 saturations to 20% with spiral CT negative for pulmonary embolus -prolonged severe postoperative pain, CT abdomen / pelvis negative for findings other than ileus -small bowel ileus and inability to place NG tube after several attempts -acute blood loss anemia with Hemoglobin nadir of 5.6 and s/p 3uPRBCs -started on Procardia as anti-hypertensive as patient was not taking one at home  By time of discharge on POD#6, her pain was controlled on oral pain medications; she was ambulating, voiding without difficulty, tolerating regular diet and passing flatus. She was deemed stable for discharge to home.   Discharge Exam: BP 119/88 (BP Location: Right Arm)   Pulse 95   Temp 99.1 F (37.3 C) (Oral)   Resp 18   Ht 5\' 2"  (1.575 m)   Wt 54 kg   SpO2 100%   BMI 21.77 kg/m  General appearance: alert and no distress  Resp: clear to auscultation bilaterally, normal respiratory effort Cardio: regular rate and rhythm  GI:  soft, non-tender; bowel sounds normal; no masses, no organomegaly.  Incision: C/D/I, no erythema, no drainage noted Pelvic: scant blood on pad  Extremities: extremities normal, atraumatic, no cyanosis or edema and Homans sign is negative, no sign of DVT  Discharged Condition: Stable  Disposition:  There are no questions and answers to display.         Allergies as of 10/24/2020      Reactions   Lisinopril Anaphylaxis, Swelling   Feet, hands, and throat became swollen   Aspirin Nausea Only   Makes stomach burn   Ibuprofen Nausea Only   Makes stomach burn   Other Nausea Only, Other (See Comments)   No spicy foods = Indigestion, also      Medication List    STOP taking these medications   ondansetron 4 MG tablet Commonly known as: Zofran   oxyCODONE-acetaminophen 5-325 MG tablet Commonly known as: PERCOCET/ROXICET     TAKE these medications   acetaminophen 500 MG tablet Commonly known as: TYLENOL Take 2 tablets (1,000 mg total) by mouth every 6 (six) hours. What changed:   when to take this  reasons to take this   albuterol 108 (90 Base) MCG/ACT inhaler Commonly known as: VENTOLIN HFA Inhale 2 puffs into the lungs every 6 (six) hours as needed for wheezing or shortness of breath.   bisacodyl 10 MG suppository Commonly known as: DULCOLAX Place 1 suppository (10 mg total) rectally daily as needed for moderate constipation.   dicyclomine 10 MG capsule Commonly known as: BENTYL Take 10 mg by mouth 3 (three) times daily as needed for spasms.   diphenhydrAMINE 25 MG  tablet Commonly known as: BENADRYL Take 25 mg by mouth 2 (two) times daily as needed for allergies.   docusate sodium 100 MG capsule Commonly known as: COLACE Take 1 capsule (100 mg total) by mouth 2 (two) times daily. To keep stools soft   fluticasone 50 MCG/ACT nasal spray Commonly known as: FLONASE Place 1 spray into both nostrils daily as needed for allergies or rhinitis.   gabapentin 800  MG tablet Commonly known as: Neurontin Take 1 tablet (800 mg total) by mouth at bedtime for 14 days. Take nightly for 3 days, then up to 14 days as needed What changed: additional instructions   magnesium oxide 400 MG tablet Commonly known as: MAG-OX Take 400 mg by mouth 2 (two) times daily.   NIFEdipine 30 MG 24 hr tablet Commonly known as: ADALAT CC Take 1 tablet (30 mg total) by mouth daily. Start taking on: October 25, 2020   ondansetron 4 MG disintegrating tablet Commonly known as: Zofran ODT 4mg  ODT q4 hours prn nausea/vomit   oxyCODONE 5 MG immediate release tablet Commonly known as: Oxy IR/ROXICODONE Take 1-2 tablets (5-10 mg total) by mouth every 4 (four) hours as needed for up to 7 days for moderate pain or severe pain. What changed:   how much to take  reasons to take this   pantoprazole 40 MG tablet Commonly known as: PROTONIX Take 40 mg by mouth 2 (two) times daily.   polyethylene glycol 17 g packet Commonly known as: MIRALAX / GLYCOLAX Take 17 g by mouth daily as needed for mild constipation.   Potassium Chloride ER 20 MEQ Tbcr Take 20 mEq by mouth daily.   sucralfate 1 g tablet Commonly known as: Carafate Take 1 tablet (1 g total) by mouth 4 (four) times daily -  with meals and at bedtime. What changed: when to take this   Turmeric 500 MG Caps Commonly known as: QC Tumeric Complex Take 450 mg by mouth in the morning and at bedtime.   VITAMIN C PO Take 1 tablet by mouth daily.   WOMENS DAILY FORMULA PO Take 1 tablet by mouth daily.       Follow-up Information    , MD In 2 weeks.   Specialty: Obstetrics and Gynecology Why: For postop check Contact information: [address] [place] [phone]               Signed:  [phone] Tekoa Hamor Attending Obstetrician & Gynecologist [facility]

## 2020-10-24 NOTE — Progress Notes (Signed)
No response via Chat. Pt. Call parameter is 104.0 or greater ;therefor, will page Provider if Pt. Temp. Is at or beyond 100.4. Will recheck Temp. At 0200 as well.

## 2020-10-24 NOTE — Progress Notes (Signed)
Pt discharged home.  Discharge instructions, prescriptions and follow up appointment given to and reviewed with pt.  Pt verbalized understanding.  Escorted by auxillary. 

## 2020-10-25 LAB — SURGICAL PATHOLOGY

## 2020-10-31 DIAGNOSIS — R300 Dysuria: Secondary | ICD-10-CM | POA: Diagnosis not present

## 2020-11-10 DIAGNOSIS — K581 Irritable bowel syndrome with constipation: Secondary | ICD-10-CM | POA: Diagnosis not present

## 2020-11-10 DIAGNOSIS — I10 Essential (primary) hypertension: Secondary | ICD-10-CM | POA: Diagnosis not present

## 2020-11-10 DIAGNOSIS — K253 Acute gastric ulcer without hemorrhage or perforation: Secondary | ICD-10-CM | POA: Diagnosis not present

## 2020-12-01 ENCOUNTER — Telehealth: Payer: Self-pay | Admitting: Oncology

## 2020-12-01 ENCOUNTER — Inpatient Hospital Stay: Payer: BC Managed Care – PPO | Admitting: Oncology

## 2020-12-01 ENCOUNTER — Inpatient Hospital Stay: Payer: BC Managed Care – PPO | Attending: Oncology

## 2020-12-01 NOTE — Telephone Encounter (Signed)
Left message with pt to follow up about missed appt today 3/24. Requested a call back.

## 2021-01-03 DIAGNOSIS — G5601 Carpal tunnel syndrome, right upper limb: Secondary | ICD-10-CM | POA: Diagnosis not present

## 2021-01-03 DIAGNOSIS — I10 Essential (primary) hypertension: Secondary | ICD-10-CM | POA: Diagnosis not present

## 2021-01-03 DIAGNOSIS — D509 Iron deficiency anemia, unspecified: Secondary | ICD-10-CM | POA: Diagnosis not present

## 2021-01-12 ENCOUNTER — Other Ambulatory Visit: Payer: Self-pay

## 2021-01-12 ENCOUNTER — Inpatient Hospital Stay: Payer: BC Managed Care – PPO | Admitting: Oncology

## 2021-01-12 ENCOUNTER — Inpatient Hospital Stay: Payer: BC Managed Care – PPO | Attending: Oncology

## 2021-01-12 DIAGNOSIS — Z90710 Acquired absence of both cervix and uterus: Secondary | ICD-10-CM | POA: Insufficient documentation

## 2021-01-12 DIAGNOSIS — F17210 Nicotine dependence, cigarettes, uncomplicated: Secondary | ICD-10-CM | POA: Diagnosis not present

## 2021-01-12 DIAGNOSIS — R935 Abnormal findings on diagnostic imaging of other abdominal regions, including retroperitoneum: Secondary | ICD-10-CM

## 2021-01-12 DIAGNOSIS — Z79899 Other long term (current) drug therapy: Secondary | ICD-10-CM | POA: Insufficient documentation

## 2021-01-12 DIAGNOSIS — Z800 Family history of malignant neoplasm of digestive organs: Secondary | ICD-10-CM | POA: Diagnosis not present

## 2021-01-12 DIAGNOSIS — N83201 Unspecified ovarian cyst, right side: Secondary | ICD-10-CM | POA: Diagnosis not present

## 2021-01-12 DIAGNOSIS — D649 Anemia, unspecified: Secondary | ICD-10-CM | POA: Diagnosis not present

## 2021-01-12 DIAGNOSIS — R197 Diarrhea, unspecified: Secondary | ICD-10-CM | POA: Insufficient documentation

## 2021-01-12 LAB — CBC WITH DIFFERENTIAL/PLATELET
Abs Immature Granulocytes: 0.01 10*3/uL (ref 0.00–0.07)
Basophils Absolute: 0 10*3/uL (ref 0.0–0.1)
Basophils Relative: 0 %
Eosinophils Absolute: 0.2 10*3/uL (ref 0.0–0.5)
Eosinophils Relative: 5 %
HCT: 36.4 % (ref 36.0–46.0)
Hemoglobin: 12.2 g/dL (ref 12.0–15.0)
Immature Granulocytes: 0 %
Lymphocytes Relative: 33 %
Lymphs Abs: 1.6 10*3/uL (ref 0.7–4.0)
MCH: 32 pg (ref 26.0–34.0)
MCHC: 33.5 g/dL (ref 30.0–36.0)
MCV: 95.5 fL (ref 80.0–100.0)
Monocytes Absolute: 0.3 10*3/uL (ref 0.1–1.0)
Monocytes Relative: 6 %
Neutro Abs: 2.7 10*3/uL (ref 1.7–7.7)
Neutrophils Relative %: 56 %
Platelets: 309 10*3/uL (ref 150–400)
RBC: 3.81 MIL/uL — ABNORMAL LOW (ref 3.87–5.11)
RDW: 14.4 % (ref 11.5–15.5)
WBC: 4.8 10*3/uL (ref 4.0–10.5)
nRBC: 0 % (ref 0.0–0.2)

## 2021-01-12 LAB — IRON AND TIBC
Iron: 46 ug/dL (ref 28–170)
Saturation Ratios: 14 % (ref 10.4–31.8)
TIBC: 323 ug/dL (ref 250–450)
UIBC: 277 ug/dL

## 2021-01-12 LAB — VITAMIN B12: Vitamin B-12: 284 pg/mL (ref 180–914)

## 2021-01-12 LAB — FERRITIN: Ferritin: 46 ng/mL (ref 11–307)

## 2021-01-13 LAB — FOLATE RBC
Folate, Hemolysate: 336 ng/mL
Folate, RBC: 891 ng/mL (ref >498)
Hematocrit: 37.7 % (ref 34.0–46.6)

## 2021-01-14 ENCOUNTER — Emergency Department: Payer: BC Managed Care – PPO

## 2021-01-14 ENCOUNTER — Other Ambulatory Visit: Payer: Self-pay

## 2021-01-14 ENCOUNTER — Emergency Department
Admission: EM | Admit: 2021-01-14 | Discharge: 2021-01-14 | Disposition: A | Discharge status: Home / Self Care | Payer: BC Managed Care – PPO | Attending: Emergency Medicine | Admitting: Emergency Medicine

## 2021-01-14 DIAGNOSIS — Z79899 Other long term (current) drug therapy: Secondary | ICD-10-CM | POA: Diagnosis not present

## 2021-01-14 DIAGNOSIS — R1111 Vomiting without nausea: Secondary | ICD-10-CM | POA: Diagnosis not present

## 2021-01-14 DIAGNOSIS — J45909 Unspecified asthma, uncomplicated: Secondary | ICD-10-CM | POA: Insufficient documentation

## 2021-01-14 DIAGNOSIS — R109 Unspecified abdominal pain: Secondary | ICD-10-CM

## 2021-01-14 DIAGNOSIS — R1012 Left upper quadrant pain: Secondary | ICD-10-CM

## 2021-01-14 DIAGNOSIS — R112 Nausea with vomiting, unspecified: Secondary | ICD-10-CM | POA: Diagnosis not present

## 2021-01-14 DIAGNOSIS — E860 Dehydration: Secondary | ICD-10-CM | POA: Diagnosis not present

## 2021-01-14 DIAGNOSIS — Z90710 Acquired absence of both cervix and uterus: Secondary | ICD-10-CM | POA: Diagnosis not present

## 2021-01-14 DIAGNOSIS — N83291 Other ovarian cyst, right side: Secondary | ICD-10-CM | POA: Diagnosis not present

## 2021-01-14 DIAGNOSIS — F17210 Nicotine dependence, cigarettes, uncomplicated: Secondary | ICD-10-CM | POA: Diagnosis not present

## 2021-01-14 DIAGNOSIS — N83201 Unspecified ovarian cyst, right side: Secondary | ICD-10-CM

## 2021-01-14 DIAGNOSIS — I700 Atherosclerosis of aorta: Secondary | ICD-10-CM | POA: Diagnosis not present

## 2021-01-14 DIAGNOSIS — I10 Essential (primary) hypertension: Secondary | ICD-10-CM | POA: Insufficient documentation

## 2021-01-14 LAB — CBC
HCT: 41.9 % (ref 36.0–46.0)
Hemoglobin: 14.3 g/dL (ref 12.0–15.0)
MCH: 31.8 pg (ref 26.0–34.0)
MCHC: 34.1 g/dL (ref 30.0–36.0)
MCV: 93.3 fL (ref 80.0–100.0)
Platelets: 400 10*3/uL (ref 150–400)
RBC: 4.49 MIL/uL (ref 3.87–5.11)
RDW: 14.1 % (ref 11.5–15.5)
WBC: 6.8 10*3/uL (ref 4.0–10.5)
nRBC: 0 % (ref 0.0–0.2)

## 2021-01-14 LAB — URINALYSIS, COMPLETE (UACMP) WITH MICROSCOPIC
Bilirubin Urine: NEGATIVE
Glucose, UA: NEGATIVE mg/dL
Ketones, ur: 80 mg/dL — AB
Leukocytes,Ua: NEGATIVE
Nitrite: NEGATIVE
Protein, ur: 100 mg/dL — AB
RBC / HPF: >50 RBC/hpf — ABNORMAL HIGH (ref 0–5)
Specific Gravity, Urine: 1.031 — ABNORMAL HIGH (ref 1.005–1.030)
pH: 6 (ref 5.0–8.0)

## 2021-01-14 LAB — COMPREHENSIVE METABOLIC PANEL
ALT: 21 U/L (ref 0–44)
AST: 23 U/L (ref 15–41)
Albumin: 5.5 g/dL — ABNORMAL HIGH (ref 3.5–5.0)
Alkaline Phosphatase: 44 U/L (ref 38–126)
Anion gap: 14 (ref 5–15)
BUN: 17 mg/dL (ref 6–20)
CO2: 25 mmol/L (ref 22–32)
Calcium: 10.5 mg/dL — ABNORMAL HIGH (ref 8.9–10.3)
Chloride: 100 mmol/L (ref 98–111)
Creatinine, Ser: 0.71 mg/dL (ref 0.44–1.00)
GFR, Estimated: >60 mL/min (ref >60)
Glucose, Bld: 118 mg/dL — ABNORMAL HIGH (ref 70–99)
Potassium: 3.7 mmol/L (ref 3.5–5.1)
Sodium: 139 mmol/L (ref 135–145)
Total Bilirubin: 0.6 mg/dL (ref 0.3–1.2)
Total Protein: 9.3 g/dL — ABNORMAL HIGH (ref 6.5–8.1)

## 2021-01-14 LAB — LIPASE, BLOOD: Lipase: 24 U/L (ref 11–51)

## 2021-01-14 LAB — PREGNANCY, URINE: Preg Test, Ur: NEGATIVE

## 2021-01-14 IMAGING — CT CT ABD-PELV W/O CM
2 of 4 series · 15 of 46 positions shown, 17 images · non-contrast
Comparison: CT [DATE].

CLINICAL DATA: Abdominal pain, acute, nonlocalized. Left upper
quadrant abdominal pain with nausea and vomiting.

EXAM:
CT ABDOMEN AND PELVIS WITHOUT CONTRAST
TECHNIQUE: Multidetector CT imaging of the abdomen and pelvis was performed
following the standard protocol without IV contrast.

[Series 2: routine abd/pel wo · axial · 0.68mm/px · z∈[-1100,-700]mm · 12 of 92 slices shown, 14 images]
[im 8/92  soft-tissue]
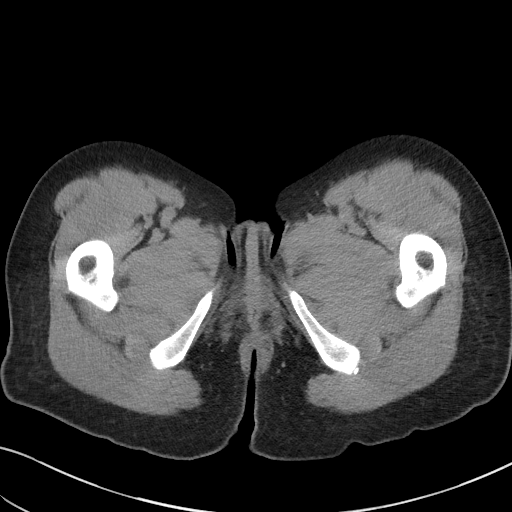
[im 8/92  bone]
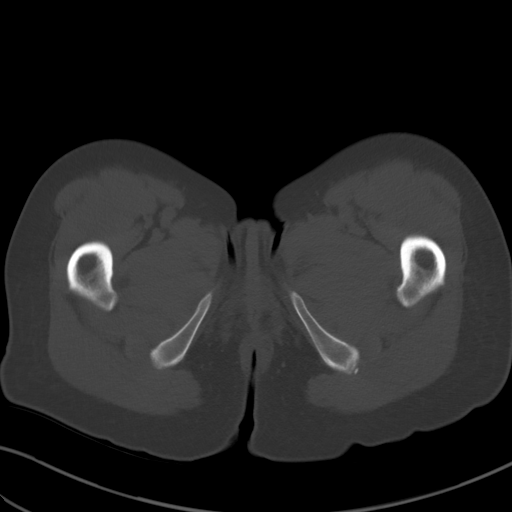
[im 15/92  soft-tissue]
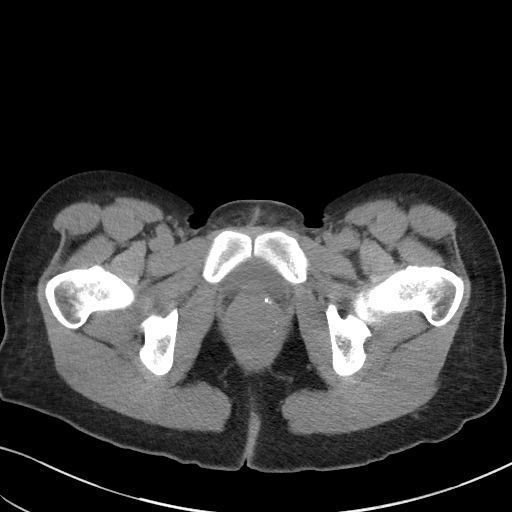
[im 22/92  soft-tissue]
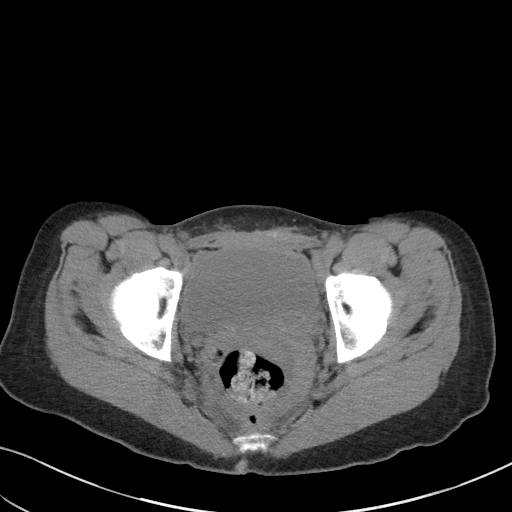
[im 30/92  soft-tissue]
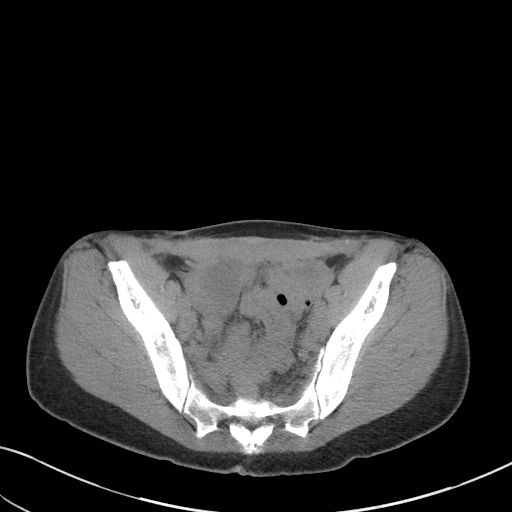
[im 37/92  soft-tissue]
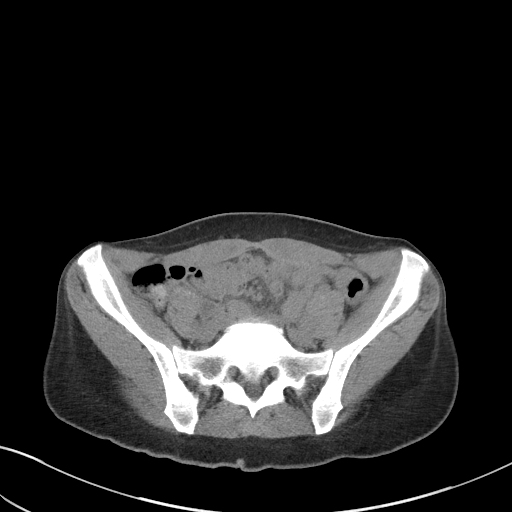
[im 44/92  soft-tissue]
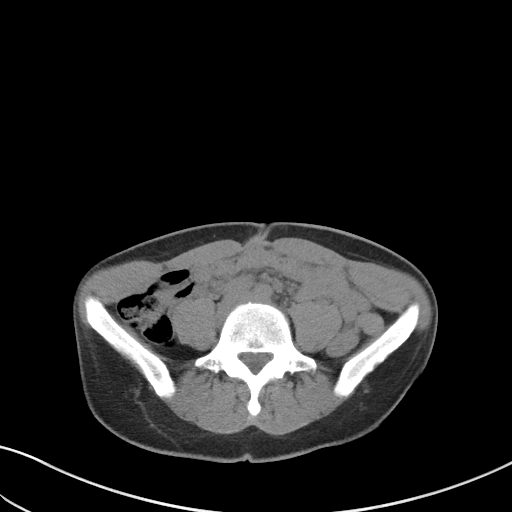
[im 51/92  soft-tissue]
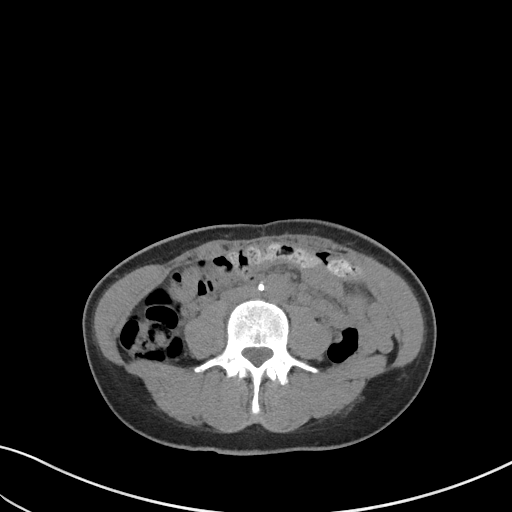
[im 59/92  soft-tissue]
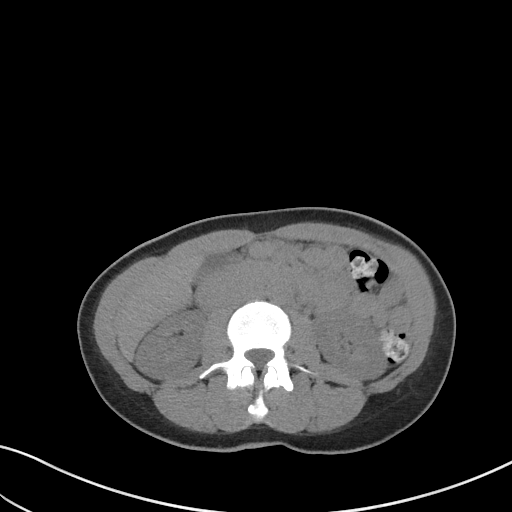
[im 66/92  soft-tissue]
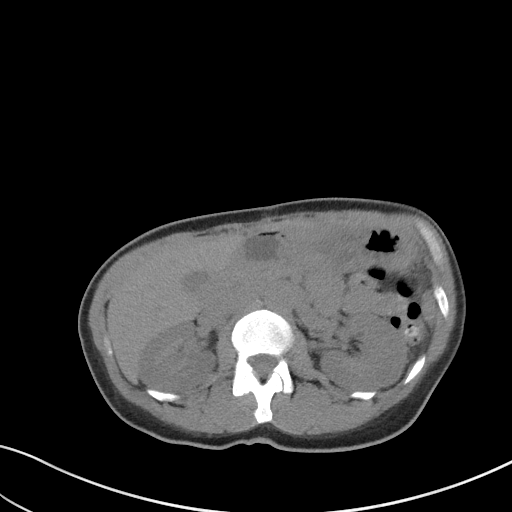
[im 66/92  bone]
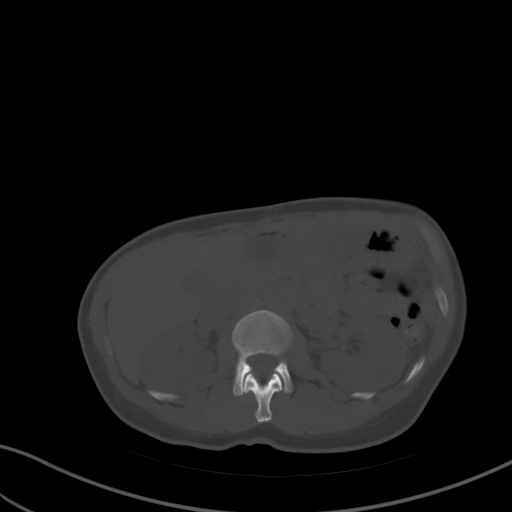
[im 73/92  soft-tissue]
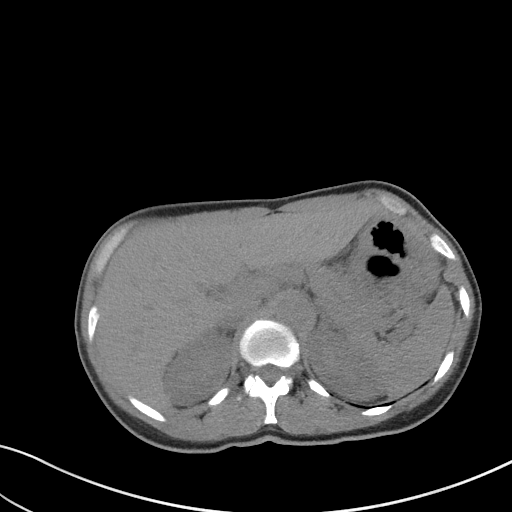
[im 81/92  soft-tissue]
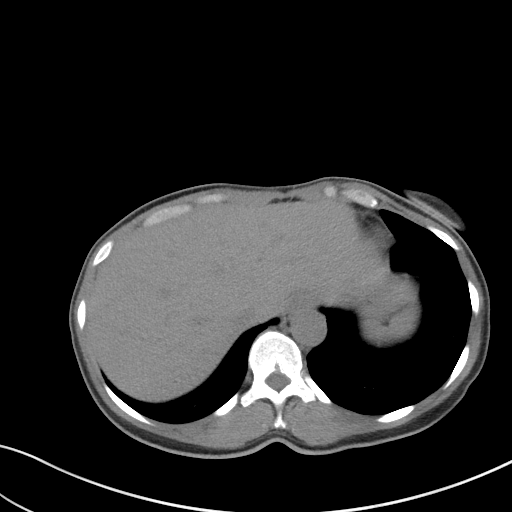
[im 88/92  soft-tissue]
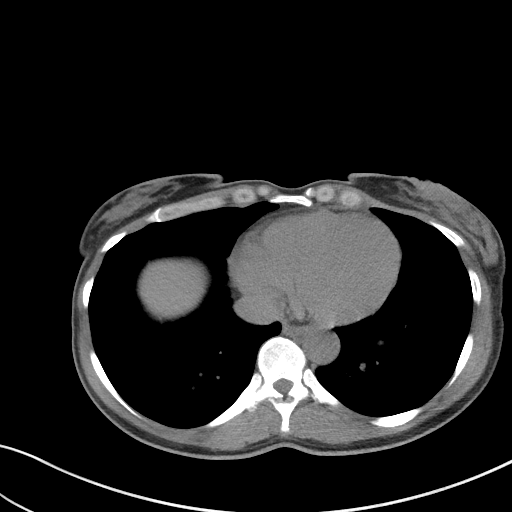

[Series 5: coronal st · coronal · 0.72mm/px · 3 of 76 slices shown]
[im 26/76  soft-tissue]
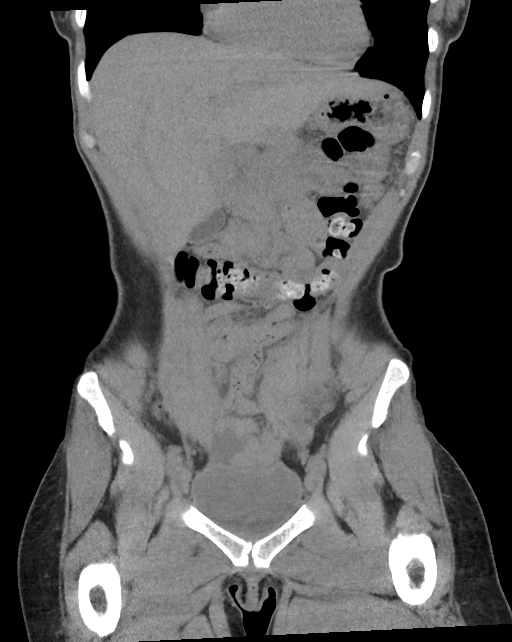
[im 34/76  soft-tissue]
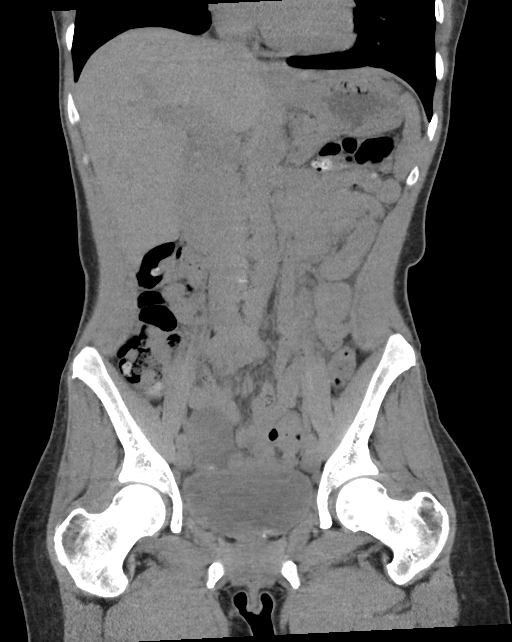
[im 42/76  soft-tissue]
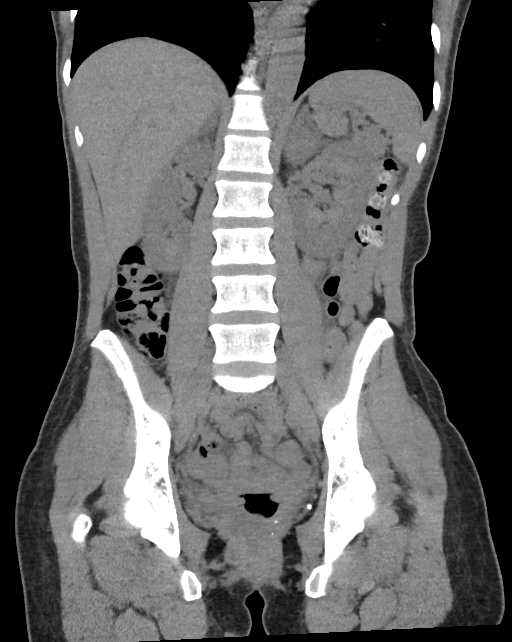

[15 of 46 positions shown; findings below may reference images not displayed]

FINDINGS: Lower chest: Clear lung bases. No significant pleural or pericardial
effusion.

Hepatobiliary: The liver appears unremarkable as imaged in the
noncontrast state. No evidence of gallstones, gallbladder wall
thickening or biliary dilatation.

Pancreas: Unremarkable. No pancreatic ductal dilatation or
surrounding inflammatory changes.

Spleen: Normal in size without focal abnormality.

Adrenals/Urinary Tract: Both adrenal glands appear normal. Mildly
increased density of the renal pyramids bilaterally without discrete
nephrocalcinosis or nephrolithiasis. There is no hydronephrosis or
perinephric soft tissue stranding. The bladder appears unremarkable
for its degree of distention.

Stomach/Bowel: No enteric contrast administered. The stomach appears
unremarkable for its degree of distension. No evidence of bowel wall
thickening, distention or surrounding inflammatory change. There is
scattered high density material within the transverse colon and
rectum. Mild sigmoid colon diverticular changes. The appendix
appears normal.

Vascular/Lymphatic: Paucity of retroperitoneal fat limits nodal
assessment of this noncontrast study. No enlarged abdominopelvic
lymph nodes are identified. Minimal aortic atherosclerosis.

Reproductive: Suspected mildly complex right adnexal lesion
measuring approximately 3.5 x 3.4 cm on image 63/2, difficult to
separate from adjacent non-opacified small bowel. Previous
hysterectomy.

Other: Resolution of previously demonstrated diffuse soft tissue
edema. There is probably a small amount of right pelvic ascites. No
free air or focal extraluminal fluid collection.

Musculoskeletal: No acute or significant osseous findings.
IMPRESSION: 1. Interval resolution of diffuse soft tissue edema and bibasilar
atelectasis compared with previous study.
2. Suspected complex right ovarian cyst, potentially symptomatic
depending on the patient's actual symptoms. Small amount of adjacent
free pelvic fluid. This could be further evaluated with pelvic
ultrasound if clinically warranted.
3. Normal appearing appendix.  No evidence of bowel obstruction.

## 2021-01-14 IMAGING — US US PELVIS COMPLETE TRANSABD/TRANSVAG W DUPLEX
1 series · 13 of 25 positions shown · non-contrast
Comparison: [DATE]

CLINICAL DATA: Right-sided abdominal pain x2 days. History of prior
hysterectomy.

EXAM:
TRANSABDOMINAL AND TRANSVAGINAL ULTRASOUND OF PELVIS
DOPPLER ULTRASOUND OF OVARIES
TECHNIQUE: Both transabdominal and transvaginal ultrasound examinations of the
pelvis were performed. Transabdominal technique was performed for
global imaging of the pelvis including uterus, ovaries, adnexal
regions, and pelvic cul-de-sac.
It was necessary to proceed with endovaginal exam following the
transabdominal exam to visualize the bilateral ovaries and bilateral
adnexa. Color and duplex Doppler ultrasound was utilized to evaluate
blood flow to the ovaries.

[Series 1: us pelvic complete w transvaginal and torsion righ · 13 of 97 slices shown]
[im 1/97]
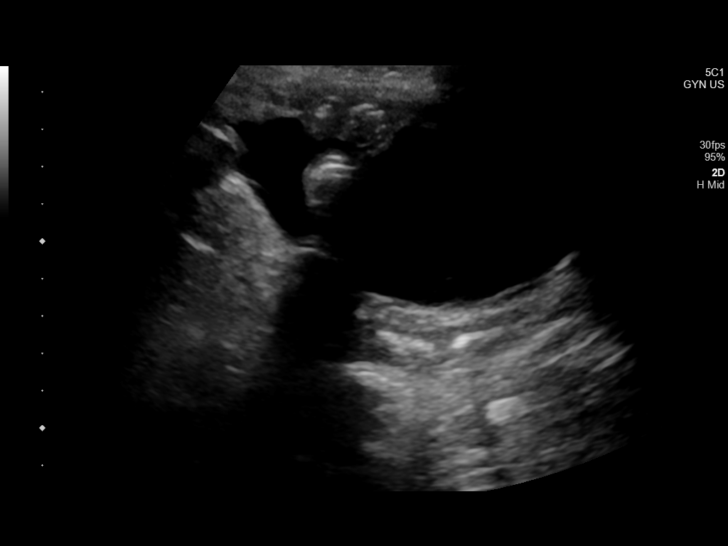
[im 9/97]
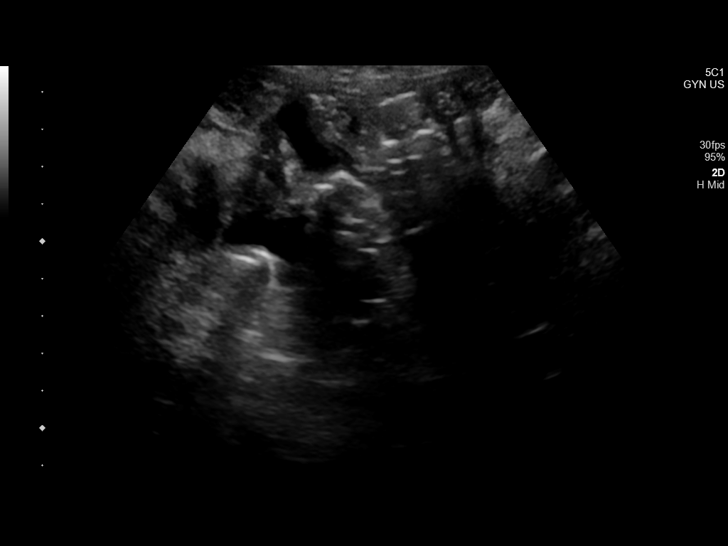
[im 17/97]
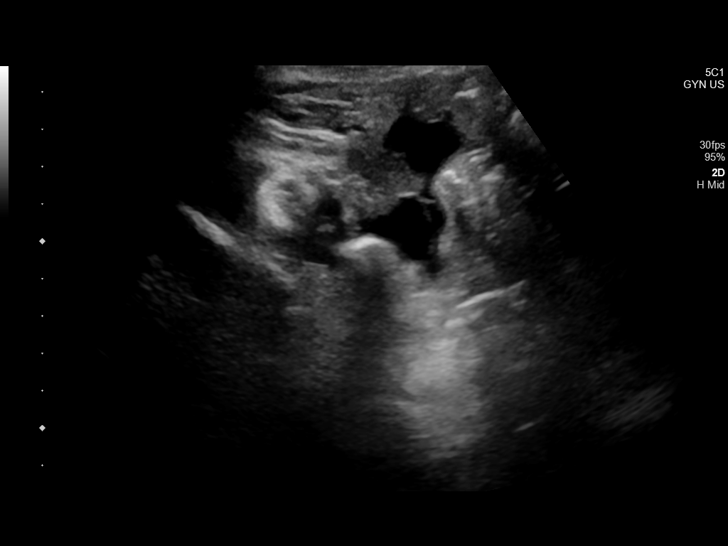
[im 25/97]
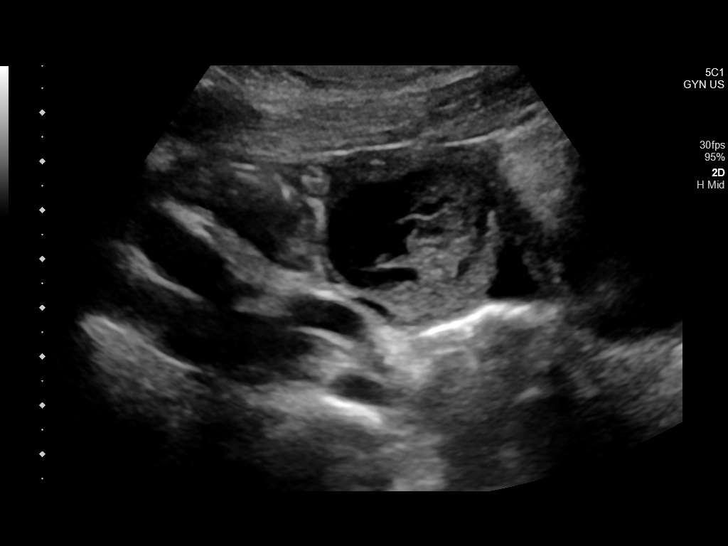
[im 33/97]
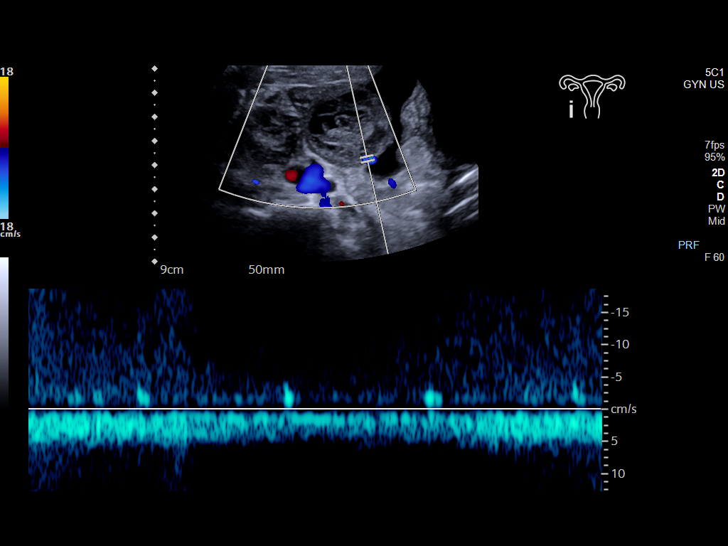
[im 41/97]
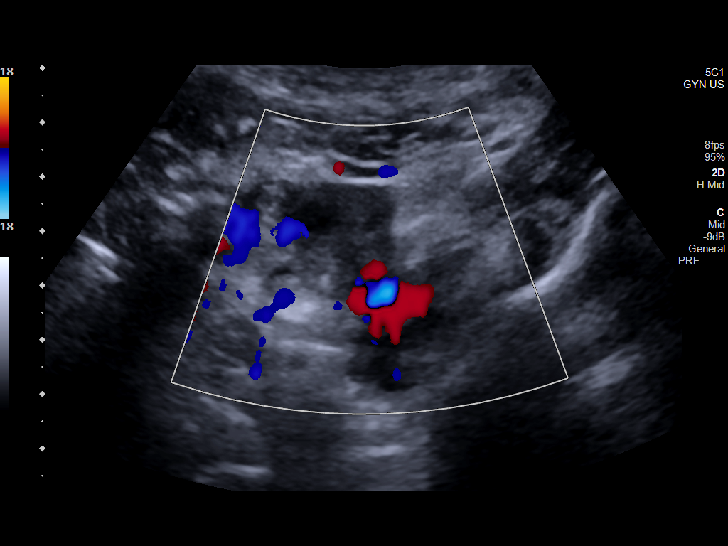
[im 49/97]
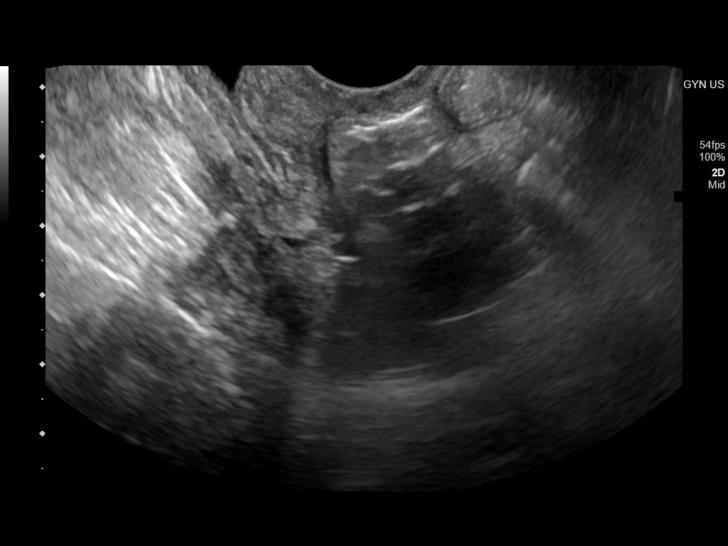
[im 57/97]
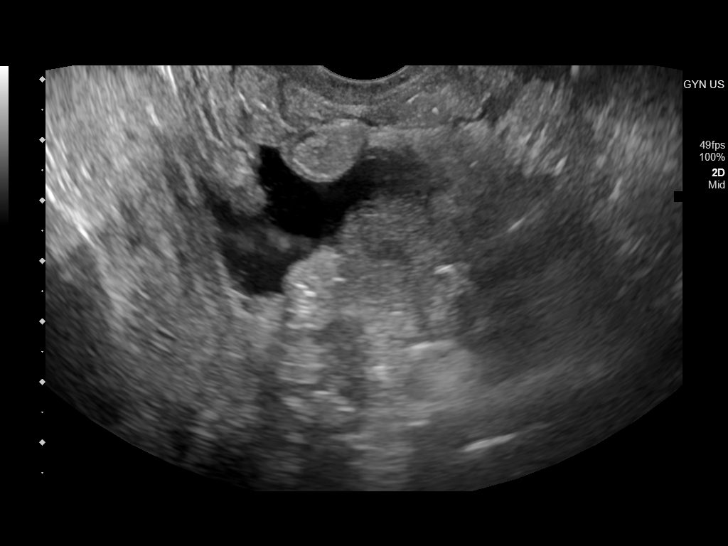
[im 65/97]
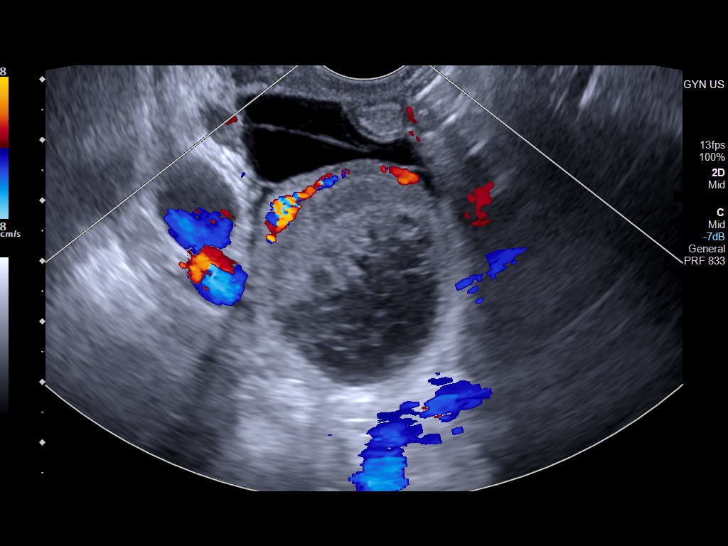
[im 73/97]
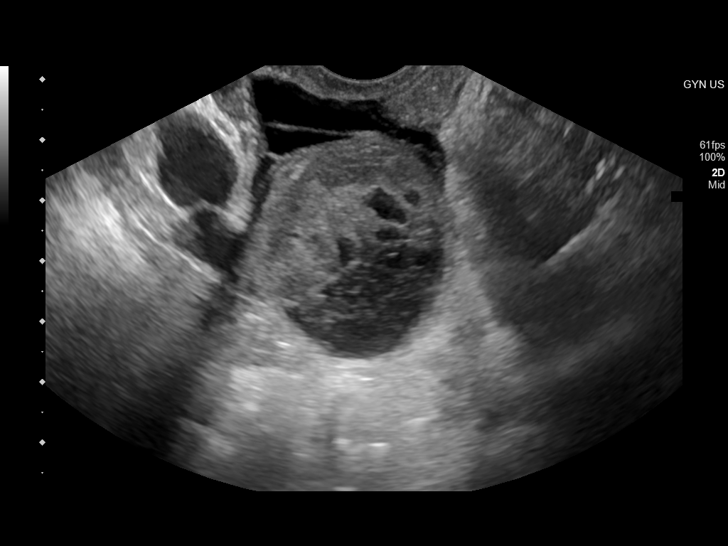
[im 81/97]
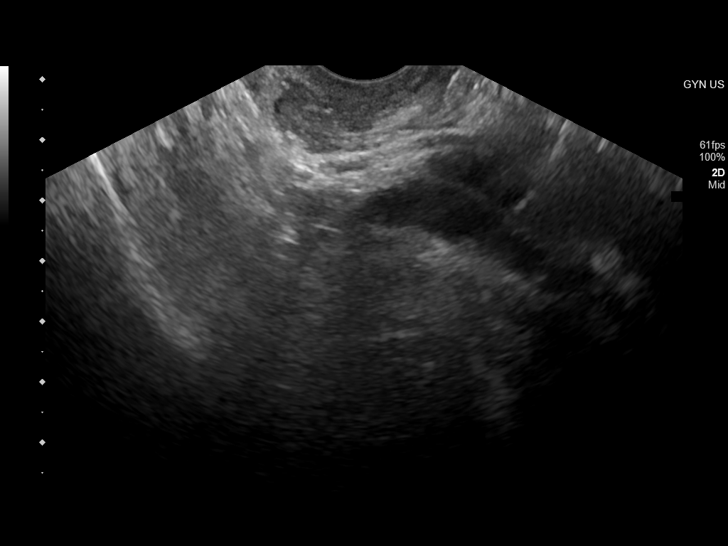
[im 89/97]
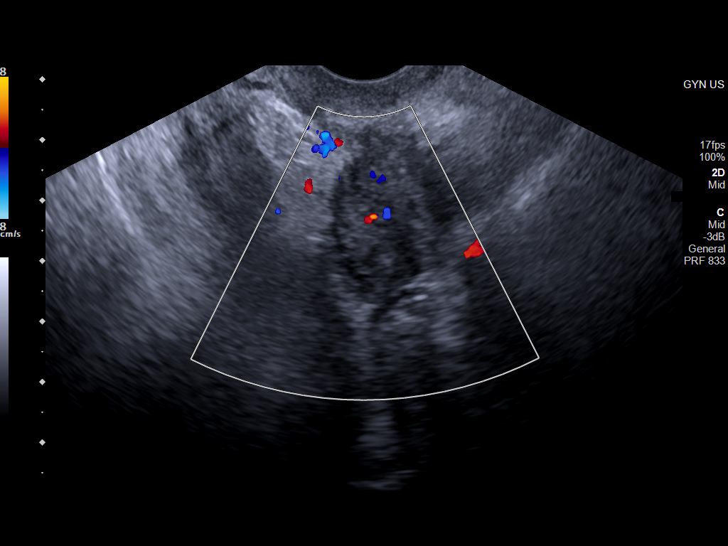
[im 97/97]
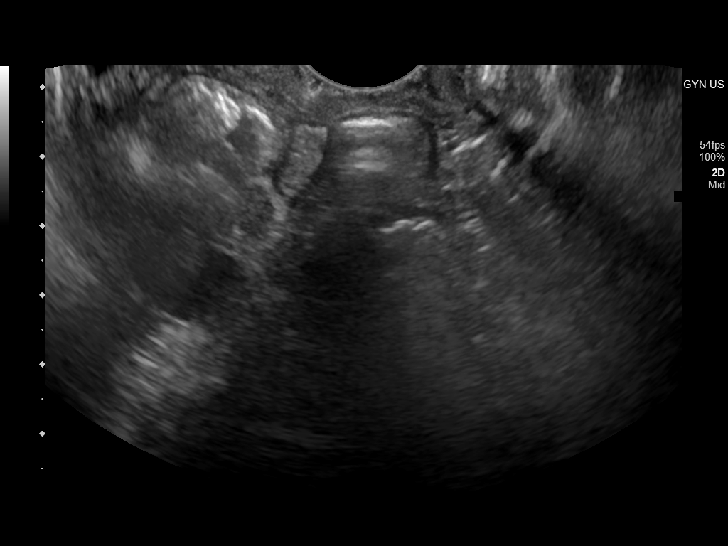

[13 of 25 positions shown; findings below may reference images not displayed]

FINDINGS: Uterus

The uterus is surgically absent.

Endometrium

Surgically absent.

Right ovary

Measurements: 4.7 cm x 3.6 cm x 3.8 cm = volume: 34 mL. A 3.2 cm x
3.1 cm x 3.5 cm complex, mixed echogenic lesion is seen within the
right ovary. This represents a new finding when compared to the
prior study. No abnormal flow is seen within this region on color
Doppler evaluation.

Left ovary

Measurements: 2.7 cm x 2.1 cm x 2.4 cm = volume: 7 mL. Normal
appearance/no adnexal mass.

Pulsed Doppler evaluation of both ovaries demonstrates normal
low-resistance arterial and venous waveforms.

Other findings

A small amount of pelvic free fluid is seen.
IMPRESSION: 1. Findings consistent with prior hysterectomy.
2. Complex right ovarian cyst which may be hemorrhagic in nature.
Correlation with 6-12 week follow-up pelvic ultrasound is
recommended to ensure resolution. This recommendation follows the
consensus statement: Management of Asymptomatic Ovarian and Other
Adnexal Cysts Imaged at US: Society of Radiologists in Ultrasound

## 2021-01-14 MED ORDER — PROMETHAZINE HCL 25 MG RE SUPP: 25.0000 mg | Freq: Three times a day (TID) | RECTAL | 0 refills | Status: AC | PRN

## 2021-01-14 MED ORDER — SUCRALFATE 1 G PO TABS
1.0000 g | ORAL_TABLET | Freq: Once | ORAL | Status: AC
  Administered 2021-01-14: 1 g via ORAL
  Filled 2021-01-14: qty 1

## 2021-01-14 MED ORDER — ALUM & MAG HYDROXIDE-SIMETH 200-200-20 MG/5ML PO SUSP
15.0000 mL | Freq: Once | ORAL | Status: AC
  Administered 2021-01-14: 15 mL via ORAL
  Filled 2021-01-14: qty 30

## 2021-01-14 MED ORDER — ONDANSETRON 4 MG PO TBDP: 4.0000 mg | ORAL_TABLET | Freq: Three times a day (TID) | ORAL | 0 refills | Status: DC | PRN

## 2021-01-14 MED ORDER — SODIUM CHLORIDE 0.9 % IV SOLN
2.0000 g | Freq: Once | INTRAVENOUS | Status: AC
  Administered 2021-01-14: 2 g via INTRAVENOUS
  Filled 2021-01-14: qty 20

## 2021-01-14 MED ORDER — HALOPERIDOL LACTATE 5 MG/ML IJ SOLN
2.5000 mg | Freq: Once | INTRAMUSCULAR | Status: AC
  Administered 2021-01-14: 2.5 mg via INTRAVENOUS
  Filled 2021-01-14: qty 1

## 2021-01-14 MED ORDER — FAMOTIDINE IN NACL 20-0.9 MG/50ML-% IV SOLN
20.0000 mg | Freq: Once | INTRAVENOUS | Status: AC
  Administered 2021-01-14: 20 mg via INTRAVENOUS
  Filled 2021-01-14: qty 50

## 2021-01-14 MED ORDER — MORPHINE SULFATE (PF) 4 MG/ML IV SOLN
4.0000 mg | Freq: Once | INTRAVENOUS | Status: AC
  Administered 2021-01-14: 4 mg via INTRAVENOUS
  Filled 2021-01-14: qty 1

## 2021-01-14 MED ORDER — ONDANSETRON HCL 4 MG/2ML IJ SOLN
4.0000 mg | INTRAMUSCULAR | Status: AC
  Administered 2021-01-14: 4 mg via INTRAVENOUS
  Filled 2021-01-14: qty 2

## 2021-01-14 MED ORDER — DIPHENHYDRAMINE HCL 50 MG/ML IJ SOLN
25.0000 mg | Freq: Once | INTRAMUSCULAR | Status: AC
  Administered 2021-01-14: 25 mg via INTRAVENOUS
  Filled 2021-01-14: qty 1

## 2021-01-14 MED ORDER — SODIUM CHLORIDE 0.9 % IV BOLUS
1000.0000 mL | Freq: Once | INTRAVENOUS | Status: AC
  Administered 2021-01-14: 1000 mL via INTRAVENOUS

## 2021-01-14 MED ORDER — SUCRALFATE 1 G PO TABS: 1.0000 g | ORAL_TABLET | Freq: Three times a day (TID) | ORAL | 0 refills | Status: DC

## 2021-01-14 NOTE — ED Triage Notes (Signed)
Pt states she started having vomiting and LUQ abdominal pain 2 days ago- pt unable to keep anything down- pt denies urinary symptoms

## 2021-01-14 NOTE — ED Provider Notes (Signed)
[facility] Emergency Department Provider Note   ____________________________________________   Event Date/Time   First MD Initiated Contact with Patient 01/14/21 1604     (approximate)  I have reviewed the triage vital signs and the nursing notes.   HISTORY  Chief Complaint Emesis and Abdominal Pain    HPI Shirley [name] is a 45 y.o. female with a history of previous gastritis, pyelonephritis, asthma, hysterectomy  Patient presents with rather abrupt severe abdominal pain starting today with frequent vomiting and mid to left upper abdominal pain.  Reports she cannot keep anything on her stomach.  Denies any vaginal bleeding or lower abdominal pain.  Reports severe pain located primarily in the mid to left abdomen primarily in the upper region  She does report a similar symptom where she was diagnosed with "gastritis"   Past Medical History:  Diagnosis Date  . Anemia   . Asthma   . Bacteremia   . Blood dyscrasia    has to apply a lot of pressure to stop bleeding  . GERD (gastroesophageal reflux disease)   . Heart murmur    with pregnancy  . History of hiatal hernia   . Hypertension    4-5 years ago, no meds  . Kidney infection    patient states I might have a kidney infection  . Migraine    migraines 1-2x/month  . Pyelonephritis   . Seizure (HCC) 05/09/2017   due to low potassium  . Wears dentures    partial upper and lower    Patient Active Problem List   Diagnosis Date Noted  . Endometriosis 10/24/2020  . Acute blood loss anemia 10/24/2020  . Hypoxia 10/24/2020  . Acute pelvic pain 10/18/2020  . Iron deficiency anemia 08/10/2019  . Acute peptic ulcer of stomach   . Dysphagia, idiopathic   . Nausea   . Abnormal CT scan, colon   . Pyelonephritis 08/03/2017  . Bacteremia 08/03/2017  . Hypokalemia 08/03/2017  . Tobacco use 08/03/2017  . Acute postoperative pain 07/31/2016  . Menometrorrhagia 04/09/2016  . Anemia  04/09/2016  . Abnormal uterine bleeding (AUB) 04/09/2016  . Essential hypertension 11/17/2009    Past Surgical History:  Procedure Laterality Date  . ABDOMINAL HERNIA REPAIR    . CESAREAN SECTION     x 4  . CESAREAN SECTION     x 4  . COLONOSCOPY WITH PROPOFOL N/A 07/24/2019   Procedure: COLONOSCOPY WITH PROPOFOL;  Surgeon: Pasty Spillers, MD;  Location: [facility];  Service: Endoscopy;  Laterality: N/A;  . DILATATION & CURETTAGE/HYSTEROSCOPY WITH MYOSURE N/A 10/11/2020   Procedure: DILATATION & CURETTAGE/HYSTEROSCOPY WITH MYOSURE LYSIS OF ADHESIONS;  Surgeon: Christeen Douglas, MD;  Location: [facility];  Service: Gynecology;  Laterality: N/A;  . ENDOMETRIAL ABLATION  07/31/2016  . ESOPHAGOGASTRODUODENOSCOPY (EGD) WITH PROPOFOL N/A 07/24/2019   Procedure: ESOPHAGOGASTRODUODENOSCOPY (EGD) WITH PROPOFOL;  Surgeon: Pasty Spillers, MD;  Location: [facility];  Service: Endoscopy;  Laterality: N/A;  . HYSTERECTOMY ABDOMINAL WITH SALPINGECTOMY Bilateral 10/18/2020   Procedure: HYSTERECTOMY ABDOMINAL WITH SALPINGECTOMY;  Surgeon: Christeen Douglas, MD;  Location: [facility];  Service: Gynecology;  Laterality: Bilateral;  . HYSTEROSCOPY  07/31/2016   Procedure: HYSTEROSCOPY WITH HYDROTHERMAL ABLATION;  Surgeon: La Salle Bing, MD;  Location: [facility];  Service: Gynecology;;    Prior to Admission medications   Medication Sig Start Date End Date Taking? Authorizing Provider  acetaminophen (TYLENOL) 500 MG tablet Take 2 tablets (1,000 mg total) by mouth every 6 (six) hours.  10/24/20   Ward, Elenora Fender, MD  acetaminophen (TYLENOL) 500 MG tablet TAKE 2 TABLETS (1,000 MG) BY MOUTH EVERY 6 HOURS. 10/24/20 10/24/21  Ward, Elenora Fender, MD  albuterol (PROVENTIL HFA;VENTOLIN HFA) 108 (90 Base) MCG/ACT inhaler Inhale 2 puffs into the lungs every 6 (six) hours as needed for wheezing or shortness of breath.    [provider]  Ascorbic Acid (VITAMIN C PO) Take 1 tablet by mouth daily.     [provider]  bisacodyl (DULCOLAX) 10 MG suppository Place 1 suppository (10 mg total) rectally daily as needed for moderate constipation. 10/24/20   Ward, Elenora Fender, MD  dicyclomine (BENTYL) 10 MG capsule Take 10 mg by mouth 3 (three) times daily as needed for spasms. 10/06/20   [provider]  diphenhydrAMINE (BENADRYL) 25 MG tablet Take 25 mg by mouth 2 (two) times daily as needed for allergies.    [provider]  docusate sodium (COLACE) 100 MG capsule Take 1 capsule (100 mg total) by mouth 2 (two) times daily. To keep stools soft 10/11/20   Christeen Douglas, MD  fluticasone [facility]) 50 MCG/ACT nasal spray Place 1 spray into both nostrils daily as needed for allergies or rhinitis.    [provider]  gabapentin (NEURONTIN) 800 MG tablet Take 1 tablet (800 mg total) by mouth at bedtime for 14 days. Take nightly for 3 days, then up to 14 days as needed Patient taking differently: Take 800 mg by mouth at bedtime. 10/11/20 10/25/20  Christeen Douglas, MD  magnesium oxide (MAG-OX) 400 MG tablet Take 400 mg by mouth 2 (two) times daily.    [provider]  Multiple Vitamins-Minerals (WOMENS DAILY FORMULA PO) Take 1 tablet by mouth daily.    [provider]  NIFEdipine (ADALAT CC) 30 MG 24 hr tablet Take 1 tablet (30 mg total) by mouth daily. 10/25/20   Ward, Elenora Fender, MD  NIFEdipine (ADALAT CC) 30 MG 24 hr tablet TAKE 1 TABLET (30 MG TOTAL) BY MOUTH DAILY. 10/24/20 10/24/21  Ward, Elenora Fender, MD  ondansetron (ZOFRAN ODT) 4 MG disintegrating tablet 4mg  ODT q4 hours prn nausea/vomit 10/24/20   Ward, 10/26/20, MD  ondansetron (ZOFRAN-ODT) 4 MG disintegrating tablet DISSOLVE ONE TABLET BY MOUTH EVERY 4 HOURS AS NEEDED FOR NAUSEA/VOMIT 10/24/20 10/24/21  Ward, 10/26/21, MD  oxyCODONE (OXY IR/ROXICODONE) 5 MG immediate release tablet TAKE 1 TO 2 TABLETS BY MOUTH EVERY 4 HOURS AS NEEDED FOR UP TO 7 DAYS FOR MODERATE PAIN OR SEVERE PAIN. 10/24/20 04/22/21  Ward,  04/24/21, MD  pantoprazole (PROTONIX) 40 MG tablet Take 40 mg by mouth 2 (two) times daily. 10/06/20   [provider]  polyethylene glycol (MIRALAX / GLYCOLAX) packet Take 17 g by mouth daily as needed for mild constipation.     [provider]  Potassium Chloride ER 20 MEQ TBCR Take 20 mEq by mouth daily. 11/12/19   [provider]  sucralfate (CARAFATE) 1 g tablet Take 1 tablet (1 g total) by mouth 4 (four) times daily -  with meals and at bedtime. Patient taking differently: Take 1 g by mouth 5 (five) times daily. 07/20/20 08/19/20  14/10/21, PA-C  Turmeric (QC TUMERIC COMPLEX) 500 MG CAPS Take 450 mg by mouth in the morning and at bedtime. 10/24/20   Ward, 10/26/20, MD    Allergies Lisinopril, Aspirin, Ibuprofen, and Other  Family History  Problem Relation Age of Onset  . Hypertension Mother   . Colon  cancer Father [id number]  . Stroke Brother     Social History Social History   Tobacco Use  . Smoking status: Current Every Day Smoker    Packs/day: 0.25    Types: Cigarettes  . Smokeless tobacco: Never Used  . Tobacco comment: 1 black and mild/day  Vaping Use  . Vaping Use: Never used  Substance Use Topics  . Alcohol use: Yes    Comment: occasionally  . Drug use: Yes    Types: Marijuana    Comment: 10/25/2019    Review of Systems Constitutional: No fever/chills Eyes: No visual changes. ENT: No sore throat. Cardiovascular: Denies chest pain. Respiratory: Denies shortness of breath. Gastrointestinal: See HPI Genitourinary: Negative for dysuria.  No vaginal bleeding, previous hysterectomy. Musculoskeletal: Negative for back pain. Skin: Negative for rash. Neurological: Negative for headaches, areas of focal weakness or numbness.    ____________________________________________   PHYSICAL EXAM:  VITAL SIGNS: ED Triage Vitals  Enc Vitals Group     BP 01/14/21 1151 (!) 145/100     Pulse Rate 01/14/21 1151 94     Resp 01/14/21 1151 20      Temp 01/14/21 1152 97.7 F (36.5 C)     Temp Source 01/14/21 1152 Axillary     SpO2 01/14/21 1151 100 %     Weight 01/14/21 1148 120 lb (54.4 kg)     Height 01/14/21 1148 5\' 2"  (1.575 m)     Head Circumference --      Peak Flow --      Pain Score 01/14/21 1148 10     Pain Loc --      Pain Edu? --      Excl. in GC? --     Constitutional: Alert and oriented.  Appears to be in moderate to severe pain, wincing, reporting significant pain in her left abdomen Eyes: Conjunctivae are normal. Head: Atraumatic. Nose: No congestion/rhinnorhea. Mouth/Throat: Mucous membranes are moist. Neck: No stridor.  Cardiovascular: Normal rate, regular rhythm. Grossly normal heart sounds.  Good peripheral circulation. Respiratory: Normal respiratory effort.  No retractions. Lungs CTAB. Gastrointestinal: Soft and has moderate focal tenderness primarily left upper quadrant left mid abdomen with mild rebound tenderness.  No significant tenderness noted over the right abdomen right upper quadrant. No distention. Musculoskeletal: No lower extremity tenderness nor edema. Neurologic:  Normal speech and language. No gross focal neurologic deficits are appreciated.  Skin:  Skin is warm, dry and intact. No rash noted. Psychiatric: Mood and affect are normal. Speech and behavior are normal.  ____________________________________________   LABS (all labs ordered are listed, but only abnormal results are displayed)  Labs Reviewed  COMPREHENSIVE METABOLIC PANEL - Abnormal; Notable for the following components:      Result Value   Glucose, Bld 118 (*)    Calcium 10.5 (*)    Total Protein 9.3 (*)    Albumin 5.5 (*)    All other components within normal limits  LIPASE, BLOOD  CBC  URINALYSIS, COMPLETE (UACMP) WITH MICROSCOPIC  POC URINE PREG, ED   ____________________________________________  EKG   ____________________________________________  RADIOLOGY  CT pending, to be followed up by Dr.  Erma HeritageIsaacs   ____________________________________________   PROCEDURES  Procedure(s) performed: None  Procedures  Critical Care performed: No  ____________________________________________   INITIAL IMPRESSION / ASSESSMENT AND PLAN / ED COURSE  Pertinent labs & imaging results that were available during my care of the patient were reviewed by me and considered in my medical decision making (see chart for details).  Differential diagnosis includes but is not limited to, abdominal perforation, aortic dissection, cholecystitis, appendicitis, diverticulitis, colitis, esophagitis/gastritis, kidney stone, pyelonephritis, urinary tract infection, aortic aneurysm. All are considered in decision and treatment plan. Based upon the patient's presentation and risk factors, we will proceed with CT scan also obtain ultrasound pelvis to evaluate exclude torsion.  Appears to be primarily left upper quadrant associated nausea vomiting, and given her previous history of potential gastritis given the location is high in differential, peptic ulcer, etc. all considered.  Patient does report improvement in pain after medications and is resting quite a bit more comfortably at this time.     Ongoing care assigned to Dr. Erma Heritage at 4pm. Follow-up UA, TVUS, reassessment.    ____________________________________________   FINAL CLINICAL IMPRESSION(S) / ED DIAGNOSES  Final diagnoses:  Right sided abdominal pain        Note:  This document was prepared using Dragon voice recognition software and may include unintentional dictation errors       Sharyn Creamer, MD 01/30/21 1540

## 2021-01-14 NOTE — ED Notes (Signed)
UA sent

## 2021-01-14 NOTE — ED Notes (Signed)
Pt presents to ED with c/o of LUQ pain and N/V for the past 2 days. Pt is giving minimal information at this time and having husband speak for her. Pt states its hard for her to breath, pt appears to be hyperventilating at this time, pt told to slow breathing down. Pt has a O2 sat of 100% on RA and the rest of pt's VSS. Pt denies fevers or chills. Pt denies urinary symptoms. Pt is A&Ox4.

## 2021-01-14 NOTE — Discharge Instructions (Signed)
For your abd pain: Take the carafate as prescribed Continue your antacids Take the Zofran as needed for nausea. Take the suppositories for refractory/severe n/v not improving with Zofran.  For your cyst: Follow-up with OBGYN in the next several weeks

## 2021-01-14 NOTE — ED Notes (Addendum)
Pt called RN over due to a new SOB. While walking to get vitals machine, pt noted to fall out of wheelchair into the floor. Remains alert with eyes closed and eyelids fluttering. Pt able to assist in getting back in the chair. Sats at 100% upon getting up. HR 106. Pt moved to room 7 and nurse made aware. Husband with pt.

## 2021-01-14 NOTE — ED Notes (Signed)
Pt states that she is nauseas again

## 2021-01-14 NOTE — ED Notes (Signed)
AOx4 and able to voice needs appropriately.  VSS.  Respirations even and unlabored.  No acute distress.  Husband driving patient home.

## 2021-01-14 NOTE — ED Provider Notes (Signed)
Briefly, patient is here with left upper quadrant abdominal pain.  CT scan shows possible ovarian cyst.  Ultrasound pending.  Lab work is otherwise reassuring.  Suspect possible upper GI pain secondary to gastritis or acute on chronic abdominal pain as she has a history of similar presentations.  Ultrasound obtained and reviewed, shows possible hemorrhagic cyst.  Ultrasound recommends repeat ultrasound in several weeks as an outpatient and I will refer her to GYN.  The pain is not consistent with dissection.  There is no evidence of torsion on ultrasound.  Symptoms are improved in the ED after antiemetics and antacids.  Will discharge with continued antacids and antiemetics at home and good return precautions.   Shaune Pollack, MD 01/14/21 2022

## 2021-01-16 ENCOUNTER — Encounter: Payer: Self-pay | Admitting: *Deleted

## 2021-01-16 ENCOUNTER — Other Ambulatory Visit: Payer: Self-pay

## 2021-01-16 ENCOUNTER — Emergency Department
Admission: EM | Admit: 2021-01-16 | Discharge: 2021-01-16 | Disposition: A | Discharge status: Home / Self Care | Payer: BC Managed Care – PPO | Attending: Emergency Medicine | Admitting: Emergency Medicine

## 2021-01-16 DIAGNOSIS — Z7951 Long term (current) use of inhaled steroids: Secondary | ICD-10-CM | POA: Diagnosis not present

## 2021-01-16 DIAGNOSIS — J45909 Unspecified asthma, uncomplicated: Secondary | ICD-10-CM | POA: Insufficient documentation

## 2021-01-16 DIAGNOSIS — Z79899 Other long term (current) drug therapy: Secondary | ICD-10-CM | POA: Insufficient documentation

## 2021-01-16 DIAGNOSIS — K2970 Gastritis, unspecified, without bleeding: Secondary | ICD-10-CM

## 2021-01-16 DIAGNOSIS — K219 Gastro-esophageal reflux disease without esophagitis: Secondary | ICD-10-CM | POA: Insufficient documentation

## 2021-01-16 DIAGNOSIS — I10 Essential (primary) hypertension: Secondary | ICD-10-CM | POA: Insufficient documentation

## 2021-01-16 DIAGNOSIS — R1010 Upper abdominal pain, unspecified: Secondary | ICD-10-CM | POA: Insufficient documentation

## 2021-01-16 DIAGNOSIS — R112 Nausea with vomiting, unspecified: Secondary | ICD-10-CM | POA: Diagnosis not present

## 2021-01-16 DIAGNOSIS — Z20822 Contact with and (suspected) exposure to covid-19: Secondary | ICD-10-CM | POA: Diagnosis not present

## 2021-01-16 DIAGNOSIS — F17210 Nicotine dependence, cigarettes, uncomplicated: Secondary | ICD-10-CM | POA: Insufficient documentation

## 2021-01-16 LAB — COMPREHENSIVE METABOLIC PANEL
ALT: 22 U/L (ref 0–44)
AST: 20 U/L (ref 15–41)
Albumin: 5.1 g/dL — ABNORMAL HIGH (ref 3.5–5.0)
Alkaline Phosphatase: 46 U/L (ref 38–126)
Anion gap: 12 (ref 5–15)
BUN: 21 mg/dL — ABNORMAL HIGH (ref 6–20)
CO2: 23 mmol/L (ref 22–32)
Calcium: 10 mg/dL (ref 8.9–10.3)
Chloride: 102 mmol/L (ref 98–111)
Creatinine, Ser: 0.48 mg/dL (ref 0.44–1.00)
GFR, Estimated: >60 mL/min (ref >60)
Glucose, Bld: 104 mg/dL — ABNORMAL HIGH (ref 70–99)
Potassium: 3.3 mmol/L — ABNORMAL LOW (ref 3.5–5.1)
Sodium: 137 mmol/L (ref 135–145)
Total Bilirubin: 0.7 mg/dL (ref 0.3–1.2)
Total Protein: 8.3 g/dL — ABNORMAL HIGH (ref 6.5–8.1)

## 2021-01-16 LAB — RESP PANEL BY RT-PCR (FLU A&B, COVID) ARPGX2
Influenza A by PCR: NEGATIVE
Influenza B by PCR: NEGATIVE
SARS Coronavirus 2 by RT PCR: NEGATIVE

## 2021-01-16 LAB — URINE CULTURE: Culture: >=100000 — AB

## 2021-01-16 LAB — CBC
HCT: 39.7 % (ref 36.0–46.0)
Hemoglobin: 13.4 g/dL (ref 12.0–15.0)
MCH: 31.8 pg (ref 26.0–34.0)
MCHC: 33.8 g/dL (ref 30.0–36.0)
MCV: 94.1 fL (ref 80.0–100.0)
Platelets: 364 10*3/uL (ref 150–400)
RBC: 4.22 MIL/uL (ref 3.87–5.11)
RDW: 13.6 % (ref 11.5–15.5)
WBC: 6.6 10*3/uL (ref 4.0–10.5)
nRBC: 0 % (ref 0.0–0.2)

## 2021-01-16 LAB — LIPASE, BLOOD: Lipase: 27 U/L (ref 11–51)

## 2021-01-16 MED ORDER — MORPHINE SULFATE (PF) 4 MG/ML IV SOLN
4.0000 mg | Freq: Once | INTRAVENOUS | Status: AC
  Administered 2021-01-16: 4 mg via INTRAVENOUS
  Filled 2021-01-16: qty 1

## 2021-01-16 MED ORDER — ALUM & MAG HYDROXIDE-SIMETH 200-200-20 MG/5ML PO SUSP
30.0000 mL | Freq: Once | ORAL | Status: AC
  Administered 2021-01-16: 30 mL via ORAL
  Filled 2021-01-16: qty 30

## 2021-01-16 MED ORDER — LIDOCAINE VISCOUS HCL 2 % MT SOLN
15.0000 mL | Freq: Once | OROMUCOSAL | Status: AC
  Administered 2021-01-16: 15 mL via ORAL
  Filled 2021-01-16: qty 15

## 2021-01-16 MED ORDER — ONDANSETRON HCL 4 MG/2ML IJ SOLN
4.0000 mg | Freq: Once | INTRAMUSCULAR | Status: AC
  Administered 2021-01-16: 4 mg via INTRAVENOUS
  Filled 2021-01-16: qty 2

## 2021-01-16 MED ORDER — SODIUM CHLORIDE 0.9 % IV BOLUS
1000.0000 mL | Freq: Once | INTRAVENOUS | Status: AC
  Administered 2021-01-16: 1000 mL via INTRAVENOUS

## 2021-01-16 NOTE — ED Provider Notes (Signed)
[facility] Emergency Department Provider Note  Time seen: 8:44 PM  I have reviewed the triage vital signs and the nursing notes.   HISTORY  Chief Complaint Abdominal Pain   HPI Shirley [name] is a 45 y.o. female with a past medical history of anemia, gastric reflux, hypertension, presents to the emergency department for nausea vomiting and abdominal pain.   According to the patient for the past few days she has been experiencing nausea vomiting upper abdominal pain.  Patient states he is no better so she came back to the emergency department.  Patient was seen 2 days ago had extensive work-up performed at that time including CT scan and a pelvic ultrasound.  Findings were consistent with a right adnexal cyst.  Patient states that she continues to be nauseated with frequent vomiting.  No fever.  Denies lower abdominal pain.  No dysuria or hematuria.  Past Medical History:  Diagnosis Date  . Anemia   . Asthma   . Bacteremia   . Blood dyscrasia    has to apply a lot of pressure to stop bleeding  . GERD (gastroesophageal reflux disease)   . Heart murmur    with pregnancy  . History of hiatal hernia   . Hypertension    4-5 years ago, no meds  . Kidney infection    patient states I might have a kidney infection  . Migraine    migraines 1-2x/month  . Pyelonephritis   . Seizure (HCC) 05/09/2017   due to low potassium  . Wears dentures    partial upper and lower    Patient Active Problem List   Diagnosis Date Noted  . Endometriosis 10/24/2020  . Acute blood loss anemia 10/24/2020  . Hypoxia 10/24/2020  . Acute pelvic pain 10/18/2020  . Iron deficiency anemia 08/10/2019  . Acute peptic ulcer of stomach   . Dysphagia, idiopathic   . Nausea   . Abnormal CT scan, colon   . Pyelonephritis 08/03/2017  . Bacteremia 08/03/2017  . Hypokalemia 08/03/2017  . Tobacco use 08/03/2017  . Acute postoperative pain 07/31/2016  . Menometrorrhagia 04/09/2016   . Anemia 04/09/2016  . Abnormal uterine bleeding (AUB) 04/09/2016  . Essential hypertension 11/17/2009    Past Surgical History:  Procedure Laterality Date  . ABDOMINAL HERNIA REPAIR    . CESAREAN SECTION     x 4  . CESAREAN SECTION     x 4  . COLONOSCOPY WITH PROPOFOL N/A 07/24/2019   Procedure: COLONOSCOPY WITH PROPOFOL;  Surgeon: Pasty Spillersahiliani, Varnita B, MD;  [facility];  Service: Endoscopy;  Laterality: N/A;  . DILATATION & CURETTAGE/HYSTEROSCOPY WITH MYOSURE N/A 10/11/2020   Procedure: DILATATION & CURETTAGE/HYSTEROSCOPY WITH MYOSURE LYSIS OF ADHESIONS;  Surgeon: Christeen DouglasBeasley, Bethany, MD;  Location: [facility];  Service: Gynecology;  Laterality: N/A;  . ENDOMETRIAL ABLATION  07/31/2016  . ESOPHAGOGASTRODUODENOSCOPY (EGD) WITH PROPOFOL N/A 07/24/2019   Procedure: ESOPHAGOGASTRODUODENOSCOPY (EGD) WITH PROPOFOL;  Surgeon: Pasty Spillersahiliani, Varnita B, MD;  [facility];  Service: Endoscopy;  Laterality: N/A;  . HYSTERECTOMY ABDOMINAL WITH SALPINGECTOMY Bilateral 10/18/2020   Procedure: HYSTERECTOMY ABDOMINAL WITH SALPINGECTOMY;  Surgeon: Christeen DouglasBeasley, Bethany, MD;  Location: [facility];  Service: Gynecology;  Laterality: Bilateral;  . HYSTEROSCOPY  07/31/2016   Procedure: HYSTEROSCOPY WITH HYDROTHERMAL ABLATION;  Surgeon: [place] Bingharlie Pickens, MD;  Location: [facility];  Service: Gynecology;;    Prior to Admission medications   Medication Sig Start Date End Date Taking? Authorizing Provider  acetaminophen (TYLENOL) 500 MG tablet Take  2 tablets (1,000 mg total) by mouth every 6 (six) hours. 10/24/20   Ward, Elenora Fender, MD  acetaminophen (TYLENOL) 500 MG tablet TAKE 2 TABLETS (1,000 MG) BY MOUTH EVERY 6 HOURS. 10/24/20 10/24/21  Ward, Elenora Fender, MD  albuterol (PROVENTIL HFA;VENTOLIN HFA) 108 (90 Base) MCG/ACT inhaler Inhale 2 puffs into the lungs every 6 (six) hours as needed for wheezing or shortness of breath.    [provider]  Ascorbic Acid (VITAMIN C PO) Take 1 tablet by  mouth daily.    [provider]  bisacodyl (DULCOLAX) 10 MG suppository Place 1 suppository (10 mg total) rectally daily as needed for moderate constipation. 10/24/20   Ward, Elenora Fender, MD  dicyclomine (BENTYL) 10 MG capsule Take 10 mg by mouth 3 (three) times daily as needed for spasms. 10/06/20   [provider]  diphenhydrAMINE (BENADRYL) 25 MG tablet Take 25 mg by mouth 2 (two) times daily as needed for allergies.    [provider]  docusate sodium (COLACE) 100 MG capsule Take 1 capsule (100 mg total) by mouth 2 (two) times daily. To keep stools soft 10/11/20   Christeen Douglas, MD  fluticasone [facility]) 50 MCG/ACT nasal spray Place 1 spray into both nostrils daily as needed for allergies or rhinitis.    [provider]  gabapentin (NEURONTIN) 800 MG tablet Take 1 tablet (800 mg total) by mouth at bedtime for 14 days. Take nightly for 3 days, then up to 14 days as needed Patient taking differently: Take 800 mg by mouth at bedtime. 10/11/20 10/25/20  Christeen Douglas, MD  magnesium oxide (MAG-OX) 400 MG tablet Take 400 mg by mouth 2 (two) times daily.    [provider]  Multiple Vitamins-Minerals (WOMENS DAILY FORMULA PO) Take 1 tablet by mouth daily.    [provider]  NIFEdipine (ADALAT CC) 30 MG 24 hr tablet Take 1 tablet (30 mg total) by mouth daily. 10/25/20   Ward, Elenora Fender, MD  NIFEdipine (ADALAT CC) 30 MG 24 hr tablet TAKE 1 TABLET (30 MG TOTAL) BY MOUTH DAILY. 10/24/20 10/24/21  Ward, Elenora Fender, MD  ondansetron (ZOFRAN ODT) 4 MG disintegrating tablet Take 1 tablet (4 mg total) by mouth every 8 (eight) hours as needed for nausea or vomiting. 01/14/21   Shaune Pollack, MD  oxyCODONE (OXY IR/ROXICODONE) 5 MG immediate release tablet TAKE 1 TO 2 TABLETS BY MOUTH EVERY 4 HOURS AS NEEDED FOR UP TO 7 DAYS FOR MODERATE PAIN OR SEVERE PAIN. 10/24/20 04/22/21  Ward, Elenora Fender, MD  pantoprazole (PROTONIX) 40 MG tablet Take 40 mg by mouth 2 (two) times  daily. 10/06/20   [provider]  polyethylene glycol (MIRALAX / GLYCOLAX) packet Take 17 g by mouth daily as needed for mild constipation.     [provider]  Potassium Chloride ER 20 MEQ TBCR Take 20 mEq by mouth daily. 11/12/19   [provider]  promethazine (PHENERGAN) 25 MG suppository Place 1 suppository (25 mg total) rectally every 8 (eight) hours as needed for refractory nausea / vomiting. 01/14/21   Shaune Pollack, MD  sucralfate (CARAFATE) 1 g tablet Take 1 tablet (1 g total) by mouth 4 (four) times daily -  with meals and at bedtime for 7 days. 01/14/21 01/21/21  Shaune Pollack, MD  Turmeric (QC TUMERIC COMPLEX) 500 MG CAPS Take 450 mg by mouth in the morning and at bedtime. 10/24/20   Ward, Elenora Fender, MD    Allergies  Allergen Reactions  . Lisinopril  Anaphylaxis and Swelling    Feet, hands, and throat became swollen  . Aspirin Nausea Only    Makes stomach burn  . Ibuprofen Nausea Only    Makes stomach burn  . Other Nausea Only and Other (See Comments)    No spicy foods = Indigestion, also    Family History  Problem Relation Age of Onset  . Hypertension Mother   . Colon cancer Father 71  . Stroke Brother     Social History Social History   Tobacco Use  . Smoking status: Current Every Day Smoker    Packs/day: 0.25    Types: Cigarettes  . Smokeless tobacco: Never Used  . Tobacco comment: 1 black and mild/day  Vaping Use  . Vaping Use: Never used  Substance Use Topics  . Alcohol use: Not Currently    Comment: occasionally  . Drug use: Yes    Types: Marijuana    Comment: 10/25/2019    Review of Systems Constitutional: Negative for fever. Cardiovascular: Negative for chest pain. Respiratory: Negative for shortness of breath. Gastrointestinal: Upper abdominal pain nausea vomiting.  Moderate dull aching pain. Genitourinary: Negative for urinary compaints Musculoskeletal: Negative for musculoskeletal complaints Neurological: Negative for  headache All other ROS negative  ____________________________________________   PHYSICAL EXAM:  VITAL SIGNS: ED Triage Vitals  Enc Vitals Group     BP 01/16/21 1835 (!) 125/101     Pulse Rate 01/16/21 1835 97     Resp 01/16/21 1835 20     Temp 01/16/21 1835 98.9 F (37.2 C)     Temp Source 01/16/21 1835 Oral     SpO2 01/16/21 1835 99 %     Weight 01/16/21 1836 120 lb (54.4 kg)     Height 01/16/21 1836 5\' 2"  (1.575 m)     Head Circumference --      Peak Flow --      Pain Score 01/16/21 1836 8     Pain Loc --      Pain Edu? --      Excl. in GC? --    Constitutional: Alert and oriented. Well appearing and in no distress. Eyes: Normal exam ENT      Head: Normocephalic and atraumatic.      Mouth/Throat: Mucous membranes are moist. Cardiovascular: Normal rate, regular rhythm.  Respiratory: Normal respiratory effort without tachypnea nor retractions. Breath sounds are clear Gastrointestinal: Soft, mild diffuse tenderness to palpation without focal area of tenderness identified.  No rebound guarding or distention. Musculoskeletal: Nontender with normal range of motion in all extremities Neurologic:  Normal speech and language. No gross focal neurologic deficits  Skin:  Skin is warm, dry and intact.  Psychiatric: Mood and affect are normal.   ____________________________________________   INITIAL IMPRESSION / ASSESSMENT AND PLAN / ED COURSE  Pertinent labs & imaging results that were available during my care of the patient were reviewed by me and considered in my medical decision making (see chart for details).   Patient presents for continued upper abdominal discomfort nausea vomiting.  Patient's work-up 2 days ago reviewed by myself overall is reassuring including CT imaging, ultrasound shows likely right hemorrhagic cyst.  Do not believe that is the cause of the patient's upper abdominal pain.  Differential would include gastritis, gastroenteritis, enteritis, peptic ulcer  disease.  Lab work is reassuring.  We will IV hydrate, treat pain and nausea and reassess.  We will also check a COVID swab as a precaution.  Work-up is overall reassuring.  Symptoms are  suggestive of gastritis.  Patient still taking Protonix.  I discussed with the patient using liquid Maalox several times per day if needed for the next 1 week.  We will refer to GI medicine as well.  Shirley [name] was evaluated in Emergency Department on 01/16/2021 for the symptoms described in the history of present illness. She was evaluated in the context of the global COVID-19 pandemic, which necessitated consideration that the patient might be at risk for infection with the SARS-CoV-2 virus that causes COVID-19. Institutional protocols and algorithms that pertain to the evaluation of patients at risk for COVID-19 are in a state of rapid change based on information released by regulatory bodies including the CDC and federal and state organizations. These policies and algorithms were followed during the patient's care in the ED.  ____________________________________________   FINAL CLINICAL IMPRESSION(S) / ED DIAGNOSES  Upper abdominal pain Nausea vomiting   Minna Antis, MD 01/16/21 2145

## 2021-01-16 NOTE — ED Triage Notes (Signed)
Pt to rtiage via wheelchair.   Pt has upper abd pain with n/v  Pt was seen here 2 days ago with similar sx/    Pt states she is not any better.  Pt alert  Speech clear.

## 2021-01-16 NOTE — ED Notes (Signed)
Pt verbalized understanding of d/c instructions at this time. Follow-up care discussed at this time. Pt assisted to POV in wheelchair at this time, tolerated well, NAD noted, RR even and unlabored at this time.

## 2021-01-17 ENCOUNTER — Encounter: Payer: Self-pay | Admitting: Oncology

## 2021-01-17 ENCOUNTER — Inpatient Hospital Stay (HOSPITAL_BASED_OUTPATIENT_CLINIC_OR_DEPARTMENT_OTHER): Payer: BC Managed Care – PPO | Admitting: Oncology

## 2021-01-17 ENCOUNTER — Other Ambulatory Visit: Payer: Self-pay | Admitting: *Deleted

## 2021-01-17 ENCOUNTER — Telehealth: Payer: Self-pay | Admitting: *Deleted

## 2021-01-17 DIAGNOSIS — R197 Diarrhea, unspecified: Secondary | ICD-10-CM

## 2021-01-17 DIAGNOSIS — D649 Anemia, unspecified: Secondary | ICD-10-CM

## 2021-01-17 MED ORDER — POTASSIUM CHLORIDE ER 20 MEQ PO TBCR: 20.0000 meq | EXTENDED_RELEASE_TABLET | Freq: Every day | ORAL | 0 refills | Status: AC

## 2021-01-17 NOTE — Progress Notes (Signed)
Pt does not have concerns

## 2021-01-17 NOTE — Telephone Encounter (Signed)
Called pt and let her know that Dr. Smith Robert wanted to get stool specimen for c diff testing. I offered to have her come over and give specimen or to come over and get a packet that has all the things she needs to do it at home. Once pt gets specimen it is to come back to lab here the same day she got the specimen. Given verbal instructions and wrote it out in a packet in the bag

## 2021-01-18 NOTE — Progress Notes (Signed)
I connected with Shirley [name] on 01/18/21 at  1:00 PM EDT by video enabled telemedicine visit and verified that I am speaking with the correct person using two identifiers.   I discussed the limitations, risks, security and privacy concerns of performing an evaluation and management service by telemedicine and the availability of in-person appointments. I also discussed with the patient that there may be a patient responsible charge related to this service. The patient expressed understanding and agreed to proceed.  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 Her most recent CBC from 07/08/2019 showed white count of 6.1, H&H of 9.9/31.1 and a platelet count of 402. Looking back at her prior CBCs patient had a normal hemoglobin of around 12 between 20 17-20 18. Since November 2018 her hemoglobin has been mostly fluctuating around 10 and then drifted down to 9 more recently in October 2020. Iron studies on 07/21/2019 showed a low iron saturation of 10% with a normal TIBC of 332 patient also underwent EGD and colonoscopy by Dr. Dalia Heading in November 2020 which did not show any evidence of bleeding and normal ferritin of 45.  Results of blood work from 07/28/2019 were as follows: CBC showed white count of 4.9, H&H of 8.8/28.1 with an MCV of 87 and a platelet count of 386. CMP showed hypokalemia with a potassium of 2.9. Folate was normal. Reticulocyte count was low at 0.9% indicating hypoproliferative anemia. Haptoglobin and TSH was normal. Serum free light chain ratio and myeloma panel was unremarkable. And he was normal, serum copper, B6 and B12 levels were normal.  Bone marrow biopsyin February 2021showed mildly hypocellular marrow with  trilineage hematopoiesis and maturation. No significant dysplasia. FISH panel for MDS was negative and cytogenetics were normal. Storage iron present and ring sideroblasts absent  Interval history: Patient reports having ongoing nausea and diarrhea for the last 2 to 3 days.  She was also in the ER with symptoms of abdominal pain and Underwent CT abdomen and pelvis without contrast which showed complex right ovarian cyst which may be hemorrhagic in nature which was also followed by pelvic ultrasound which showed similar findings.   Review of Systems  Constitutional: Positive for malaise/fatigue. Negative for chills, fever and weight loss.  HENT: Negative for congestion, ear discharge and nosebleeds.   Eyes: Negative for blurred vision.  Respiratory: Negative for cough, hemoptysis, sputum production, shortness of breath and wheezing.   Cardiovascular: Negative for chest pain, palpitations, orthopnea and claudication.  Gastrointestinal: Positive for abdominal pain and diarrhea. Negative for blood in stool, constipation, heartburn, melena, nausea and vomiting.  Genitourinary: Negative for dysuria, flank pain, frequency, hematuria and urgency.  Musculoskeletal: Negative for back pain, joint pain and myalgias.  Skin: Negative for rash.  Neurological: Negative for dizziness, tingling, focal weakness, seizures, weakness and headaches.  Endo/Heme/Allergies: Does not bruise/bleed easily.  Psychiatric/Behavioral: Negative for depression and suicidal ideas. The patient does not have insomnia.     Allergies  Allergen Reactions  . Lisinopril Anaphylaxis and Swelling    Feet, hands, and throat became swollen  . Aspirin Nausea Only    Makes stomach burn  . Ibuprofen Nausea Only    Makes stomach burn  . Other Nausea Only and Other (See Comments)    No spicy foods = Indigestion,  also    Past Medical History:  Diagnosis Date  . Anemia   . Asthma   . Bacteremia   . Blood dyscrasia    has to apply  a lot of pressure to stop bleeding  . GERD (gastroesophageal reflux disease)   . Heart murmur    with pregnancy  . History of hiatal hernia   . Hypertension    4-5 years ago, no meds  . Kidney infection    patient states I might have a kidney infection  . Migraine    migraines 1-2x/month  . Pyelonephritis   . Seizure ([place]) 05/09/2017   due to low potassium  . Wears dentures    partial upper and lower    Past Surgical History:  Procedure Laterality Date  . ABDOMINAL HERNIA REPAIR    . CESAREAN SECTION     x 4  . CESAREAN SECTION     x 4  . COLONOSCOPY WITH PROPOFOL N/A 07/24/2019   Procedure: COLONOSCOPY WITH PROPOFOL;  Surgeon: Virgel Manifold, MD;  Location: [facility];  Service: Endoscopy;  Laterality: N/A;  . DILATATION & CURETTAGE/HYSTEROSCOPY WITH MYOSURE N/A 10/11/2020   Procedure: DILATATION & CURETTAGE/HYSTEROSCOPY WITH MYOSURE LYSIS OF ADHESIONS;  Surgeon: Benjaman Kindler, MD;  Location: [facility];  Service: Gynecology;  Laterality: N/A;  . ENDOMETRIAL ABLATION  07/31/2016  . ESOPHAGOGASTRODUODENOSCOPY (EGD) WITH PROPOFOL N/A 07/24/2019   Procedure: ESOPHAGOGASTRODUODENOSCOPY (EGD) WITH PROPOFOL;  Surgeon: Virgel Manifold, MD;  Location: [facility];  Service: Endoscopy;  Laterality: N/A;  . HYSTERECTOMY ABDOMINAL WITH SALPINGECTOMY Bilateral 10/18/2020   Procedure: HYSTERECTOMY ABDOMINAL WITH SALPINGECTOMY;  Surgeon: Benjaman Kindler, MD;  Location: [facility];  Service: Gynecology;  Laterality: Bilateral;  . HYSTEROSCOPY  07/31/2016   Procedure: HYSTEROSCOPY WITH HYDROTHERMAL ABLATION;  Surgeon: Aletha Halim, MD;  Location: [facility];  Service: Gynecology;;    Social History   Socioeconomic History  . Marital status: Married    Spouse name: Harrell Gave  . Number of children: 4  . Years of education: Not on file  . Highest education level: Not on file  Occupational History  . Not on file  Tobacco Use  . Smoking status: Current Every Day  Smoker    Packs/day: 0.25    Types: Cigarettes  . Smokeless tobacco: Never Used  . Tobacco comment: 1 black and mild/day  Vaping Use  . Vaping Use: Never used  Substance and Sexual Activity  . Alcohol use: Not Currently  . Drug use: Yes    Types: Marijuana    Comment: 10/25/2019  . Sexual activity: Yes    Birth control/protection: None  Other Topics Concern  . Not on file  Social History Narrative  . Not on file   Social Determinants of Health   Financial Resource Strain: Not on file  Food Insecurity: Not on file  Transportation Needs: Not on file  Physical Activity: Not on file  Stress: Not on file  Social Connections: Not on file  Intimate Partner Violence: Not on file    Family History  Problem Relation Age of Onset  . Hypertension Mother   . Colon cancer Father 57  . Stroke Brother      Current Outpatient Medications:  .  albuterol (PROVENTIL HFA;VENTOLIN HFA) 108 (90 Base) MCG/ACT inhaler, Inhale 2 puffs into the lungs every 6 (six) hours as needed for wheezing or shortness of breath., Disp: , Rfl:  .  Ascorbic Acid (VITAMIN C PO), Take 1 tablet by mouth daily., Disp: ,  Rfl:  .  bisacodyl (DULCOLAX) 10 MG suppository, Place 1 suppository (10 mg total) rectally daily as needed for moderate constipation., Disp: 12 suppository, Rfl: 0 .  dicyclomine (BENTYL) 10 MG capsule, Take 10 mg by mouth 3 (three) times daily as needed for spasms., Disp: , Rfl:  .  diphenhydrAMINE (BENADRYL) 25 MG tablet, Take 25 mg by mouth 2 (two) times daily as needed for allergies., Disp: , Rfl:  .  docusate sodium (COLACE) 100 MG capsule, Take 1 capsule (100 mg total) by mouth 2 (two) times daily. To keep stools soft (Patient taking differently: Take 100 mg by mouth daily as needed. To keep stools soft), Disp: 30 capsule, Rfl: 0 .  fluticasone (FLONASE) 50 MCG/ACT nasal spray, Place 1 spray into both nostrils daily as needed for allergies or rhinitis., Disp: , Rfl:  .  magnesium oxide (MAG-OX)  400 MG tablet, Take 400 mg by mouth daily., Disp: , Rfl:  .  Multiple Vitamins-Minerals (WOMENS DAILY FORMULA PO), Take 1 tablet by mouth daily., Disp: , Rfl:  .  ondansetron (ZOFRAN ODT) 4 MG disintegrating tablet, Take 1 tablet (4 mg total) by mouth every 8 (eight) hours as needed for nausea or vomiting., Disp: 15 tablet, Rfl: 0 .  oxyCODONE (OXY IR/ROXICODONE) 5 MG immediate release tablet, TAKE 1 TO 2 TABLETS BY MOUTH EVERY 4 HOURS AS NEEDED FOR UP TO 7 DAYS FOR MODERATE PAIN OR SEVERE PAIN., Disp: 46 tablet, Rfl: 0 .  pantoprazole (PROTONIX) 40 MG tablet, Take 40 mg by mouth 2 (two) times daily., Disp: , Rfl:  .  polyethylene glycol (MIRALAX / GLYCOLAX) packet, Take 17 g by mouth daily as needed for mild constipation. , Disp: , Rfl:  .  promethazine (PHENERGAN) 25 MG suppository, Place 1 suppository (25 mg total) rectally every 8 (eight) hours as needed for refractory nausea / vomiting., Disp: 12 each, Rfl: 0 .  sucralfate (CARAFATE) 1 g tablet, Take 1 tablet (1 g total) by mouth 4 (four) times daily -  with meals and at bedtime for 7 days., Disp: 28 tablet, Rfl: 0 .  Turmeric (QC TUMERIC COMPLEX) 500 MG CAPS, Take 450 mg by mouth in the morning and at bedtime., Disp: 60 capsule, Rfl: 0 .  acetaminophen (TYLENOL) 500 MG tablet, Take 2 tablets (1,000 mg total) by mouth every 6 (six) hours., Disp: 120 tablet, Rfl: 1 .  Potassium Chloride ER 20 MEQ TBCR, Take 20 mEq by mouth daily., Disp: 14 tablet, Rfl: 0  CT ABDOMEN PELVIS WO CONTRAST  Result Date: 01/14/2021 CLINICAL DATA:  Abdominal pain, acute, nonlocalized. Left upper quadrant abdominal pain with nausea and vomiting. EXAM: CT ABDOMEN AND PELVIS WITHOUT CONTRAST TECHNIQUE: Multidetector CT imaging of the abdomen and pelvis was performed following the standard protocol without IV contrast. COMPARISON:  CT 10/20/2020. FINDINGS: Lower chest: Clear lung bases. No significant pleural or pericardial effusion. Hepatobiliary: The liver appears  unremarkable as imaged in the noncontrast state. No evidence of gallstones, gallbladder wall thickening or biliary dilatation. Pancreas: Unremarkable. No pancreatic ductal dilatation or surrounding inflammatory changes. Spleen: Normal in size without focal abnormality. Adrenals/Urinary Tract: Both adrenal glands appear normal. Mildly increased density of the renal pyramids bilaterally without discrete nephrocalcinosis or nephrolithiasis. There is no hydronephrosis or perinephric soft tissue stranding. The bladder appears unremarkable for its degree of distention. Stomach/Bowel: No enteric contrast administered. The stomach appears unremarkable for its degree of distension. No evidence of bowel wall thickening, distention or surrounding inflammatory change. There is scattered high  density material within the transverse colon and rectum. Mild sigmoid colon diverticular changes. The appendix appears normal. Vascular/Lymphatic: Paucity of retroperitoneal fat limits nodal assessment of this noncontrast study. No enlarged abdominopelvic lymph nodes are identified. Minimal aortic atherosclerosis. Reproductive: Suspected mildly complex right adnexal lesion measuring approximately 3.5 x 3.4 cm on image 63/2, difficult to separate from adjacent non-opacified small bowel. Previous hysterectomy. Other: Resolution of previously demonstrated diffuse soft tissue edema. There is probably a small amount of right pelvic ascites. No free air or focal extraluminal fluid collection. Musculoskeletal: No acute or significant osseous findings. IMPRESSION: 1. Interval resolution of diffuse soft tissue edema and bibasilar atelectasis compared with previous study. 2. Suspected complex right ovarian cyst, potentially symptomatic depending on the patient's actual symptoms. Small amount of adjacent free pelvic fluid. This could be further evaluated with pelvic ultrasound if clinically warranted. 3. Normal appearing appendix.  No evidence of  bowel obstruction. Electronically Signed   By: Richardean Sale M.D.   On: 01/14/2021 14:53   US PELVIC COMPLETE W TRANSVAGINAL AND TORSION R/O  Result Date: 01/14/2021 CLINICAL DATA:  Right-sided abdominal pain x2 days. History of prior hysterectomy. EXAM: TRANSABDOMINAL AND TRANSVAGINAL ULTRASOUND OF PELVIS DOPPLER ULTRASOUND OF OVARIES TECHNIQUE: Both transabdominal and transvaginal ultrasound examinations of the pelvis were performed. Transabdominal technique was performed for global imaging of the pelvis including uterus, ovaries, adnexal regions, and pelvic cul-de-sac. It was necessary to proceed with endovaginal exam following the transabdominal exam to visualize the bilateral ovaries and bilateral adnexa. Color and duplex Doppler ultrasound was utilized to evaluate blood flow to the ovaries. COMPARISON:  July 09, 2019 FINDINGS: Uterus The uterus is surgically absent. Endometrium Surgically absent. Right ovary Measurements: 4.7 cm x 3.6 cm x 3.8 cm = volume: 34 mL. A 3.2 cm x 3.1 cm x 3.5 cm complex, mixed echogenic lesion is seen within the right ovary. This represents a new finding when compared to the prior study. No abnormal flow is seen within this region on color Doppler evaluation. Left ovary Measurements: 2.7 cm x 2.1 cm x 2.4 cm = volume: 7 mL. Normal appearance/no adnexal mass. Pulsed Doppler evaluation of both ovaries demonstrates normal low-resistance arterial and venous waveforms. Other findings A small amount of pelvic free fluid is seen. IMPRESSION: 1. Findings consistent with prior hysterectomy. 2. Complex right ovarian cyst which may be hemorrhagic in nature. Correlation with 6-12 week follow-up pelvic ultrasound is recommended to ensure resolution. This recommendation follows the consensus statement: Management of Asymptomatic Ovarian and Other Adnexal Cysts Imaged at [place]: Society of Radiologists in [place]. Radiology 2010; [phone]. Electronically  Signed   By: Virgina Norfolk M.D.   On: 01/14/2021 17:50    No images are attached to the encounter.   CMP Latest Ref Rng & Units 01/16/2021  Glucose 70 - 99 mg/dL 104(H)  BUN 6 - 20 mg/dL 21(H)  Creatinine 0.44 - 1.00 mg/dL 0.48  Sodium 135 - 145 mmol/L 137  Potassium 3.5 - 5.1 mmol/L 3.3(L)  Chloride 98 - 111 mmol/L 102  CO2 22 - 32 mmol/L 23  Calcium 8.9 - 10.3 mg/dL 10.0  Total Protein 6.5 - 8.1 g/dL 8.3(H)  Total Bilirubin 0.3 - 1.2 mg/dL 0.7  Alkaline Phos 38 - 126 U/L 46  AST 15 - 41 U/L 20  ALT 0 - 44 U/L 22   CBC Latest Ref Rng & Units 01/16/2021  WBC 4.0 - 10.5 K/uL 6.6  Hemoglobin 12.0 - 15.0 g/dL 13.4  Hematocrit 36.0 -  46.0 % 39.7  Platelets 150 - 400 K/uL 364     Observation/objective: Appears in no acute distress over video visit today.  Breathing is nonlabored  Assessment and plan: Patient is a 45 year old female with history of normocytic anemia of unclear etiology here for routine follow-up  Normocytic anemia: Patient's hemoglobin at baseline runs around 10-11.  She has had a complete anemia work-up including a bone marrow biopsy which did not reveal any significant etiology.  In February 2022 her hemoglobin had dropped down to 5.6 and then went up to 10.3.  This month her hemoglobin has been between 12-14 which is the best it has been in a long time.  Iron studies are within normal limits.  B12 levels are mildly low at 284 and folate is normal.  With regards to her anemia I will repeat her labs in 4 months and see her thereafter  Patient has seen GYN in the past when she underwent a hysterectomy in February 2022.  She has not undergone oophorectomy and CT abdomen as well as pelvic ultrasound showed complex right ovarian cyst which needs to be followed up with a repeat ultrasound in 6 to 12 weeks.  I have asked her to speak to her GYN about these findings  Diarrhea: We will have her come here to get stool test for C. difficile.  If testing is negative and patient  continues to have diarrhea she would need to speak to her primary care doctor  Follow-up instructions:  I discussed the assessment and treatment plan with the patient. The patient was provided an opportunity to ask questions and all were answered. The patient agreed with the plan and demonstrated an understanding of the instructions.   The patient was advised to call back or seek an in-person evaluation if the symptoms worsen or if the condition fails to improve as anticipated.  Visit Diagnosis: 1. Diarrhea, unspecified type   2. Normocytic anemia     Dr. Randa Evens, MD, MPH [facility] at [facility] Tel- [phone] 01/18/2021 12:06 PM

## 2021-01-23 DIAGNOSIS — N83292 Other ovarian cyst, left side: Secondary | ICD-10-CM | POA: Diagnosis not present

## 2021-01-23 DIAGNOSIS — R1031 Right lower quadrant pain: Secondary | ICD-10-CM | POA: Diagnosis not present

## 2021-01-24 ENCOUNTER — Other Ambulatory Visit: Payer: Self-pay | Admitting: Obstetrics and Gynecology

## 2021-01-24 DIAGNOSIS — R1031 Right lower quadrant pain: Secondary | ICD-10-CM

## 2021-01-25 ENCOUNTER — Ambulatory Visit
Admission: RE | Admit: 2021-01-25 | Discharge: 2021-01-25 | Disposition: A | Discharge status: Home / Self Care | Payer: BC Managed Care – PPO | Source: Ambulatory Visit | Attending: Obstetrics and Gynecology | Admitting: Obstetrics and Gynecology

## 2021-01-25 ENCOUNTER — Ambulatory Visit: Admission: RE | Admit: 2021-01-25 | Payer: BC Managed Care – PPO | Source: Ambulatory Visit

## 2021-01-25 ENCOUNTER — Other Ambulatory Visit: Payer: Self-pay

## 2021-01-25 DIAGNOSIS — R109 Unspecified abdominal pain: Secondary | ICD-10-CM | POA: Diagnosis not present

## 2021-01-25 DIAGNOSIS — R1031 Right lower quadrant pain: Secondary | ICD-10-CM | POA: Insufficient documentation

## 2021-01-25 DIAGNOSIS — R1110 Vomiting, unspecified: Secondary | ICD-10-CM | POA: Diagnosis not present

## 2021-01-25 IMAGING — CT CT ABD-PELV W/ CM
2 of 5 series · 15 of 46 positions shown, 17 images · IV contrast (omnipaque)
Comparison: [DATE]

CLINICAL DATA: Abdominal pain, primarily right-sided with nausea,
vomiting, and diarrhea

EXAM:
CT ABDOMEN AND PELVIS WITH CONTRAST
TECHNIQUE: Multidetector CT imaging of the abdomen and pelvis was performed
using the standard protocol following bolus administration of
intravenous contrast. Oral contrast was also administered.
CONTRAST:  85mL OMNIPAQUE IOHEXOL 300 MG/ML  SOLN

[Series 2: abd pelvis 5.00 · axial · 0.60mm/px · z∈[-1494,-1089]mm · 12 of 93 slices shown, 14 images]
[im 6/93  soft-tissue]
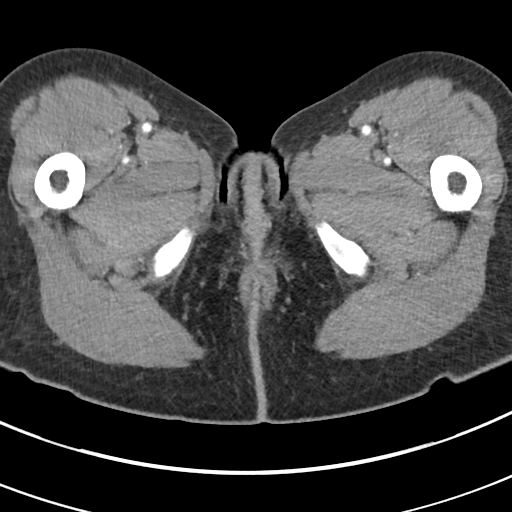
[im 6/93  bone]
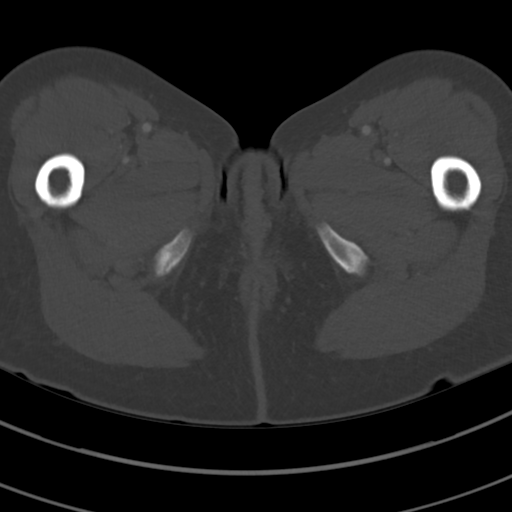
[im 16/93  soft-tissue]
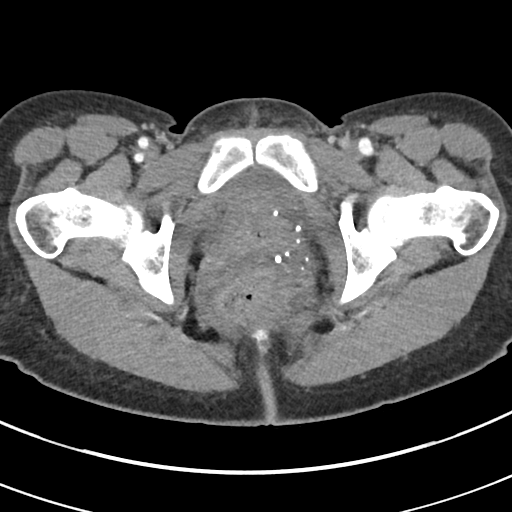
[im 21/93  soft-tissue]
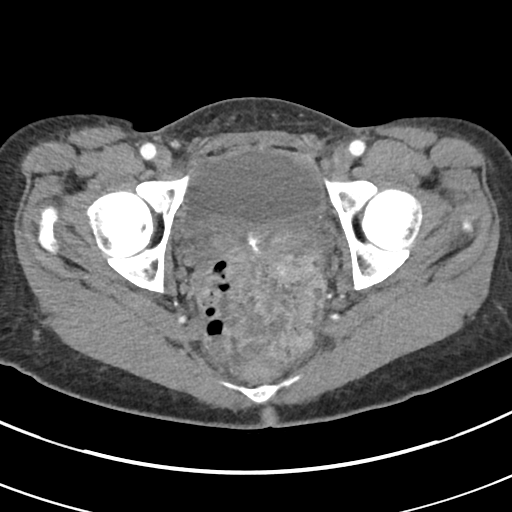
[im 26/93  soft-tissue]
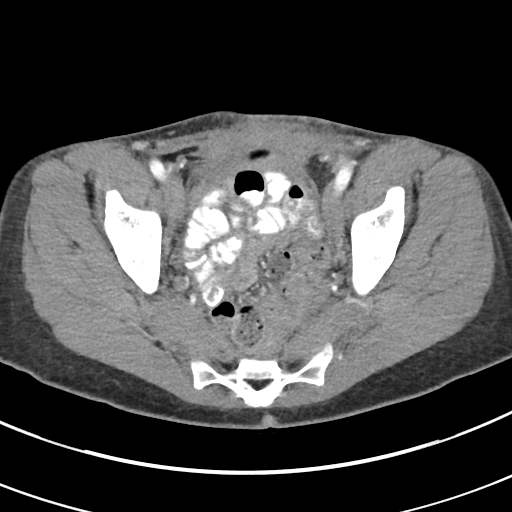
[im 36/93  soft-tissue]
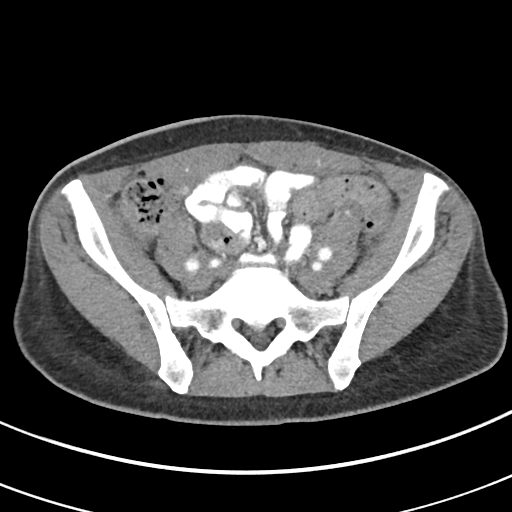
[im 41/93  soft-tissue]
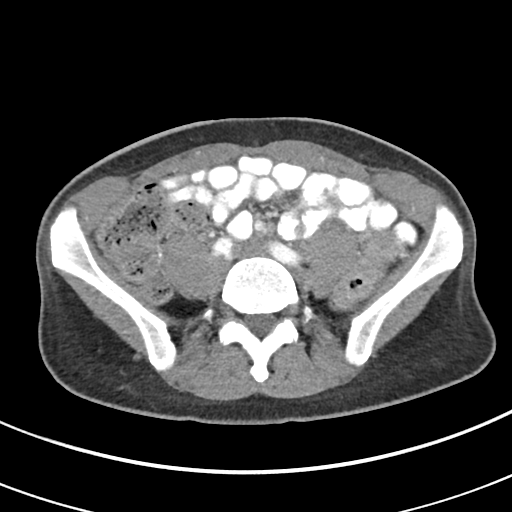
[im 52/93  soft-tissue]
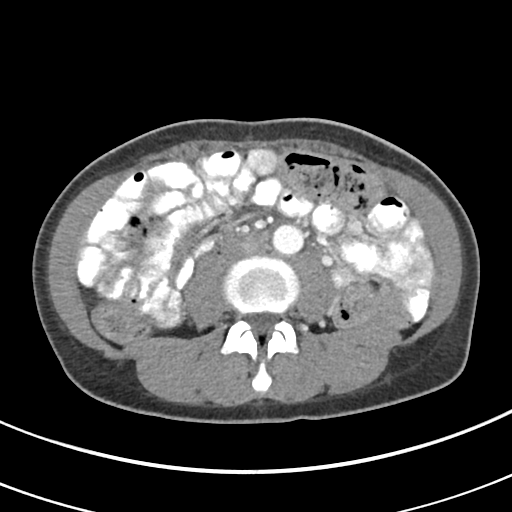
[im 57/93  soft-tissue]
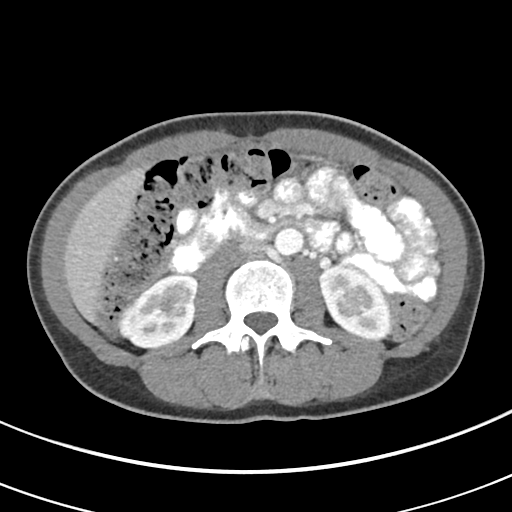
[im 67/93  soft-tissue]
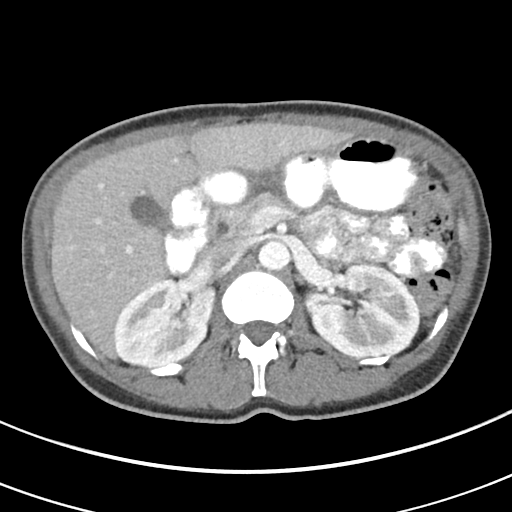
[im 67/93  bone]
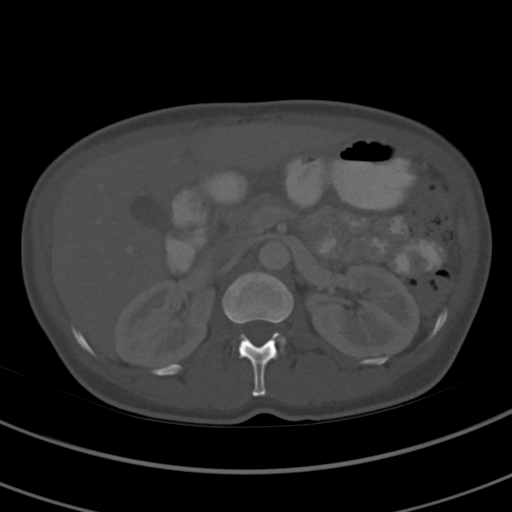
[im 72/93  soft-tissue]
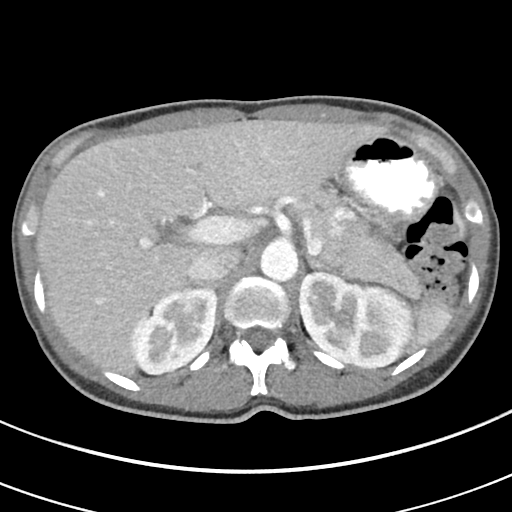
[im 77/93  soft-tissue]
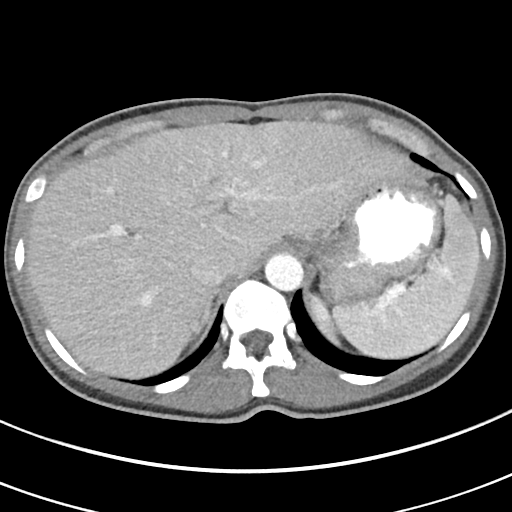
[im 87/93  soft-tissue]
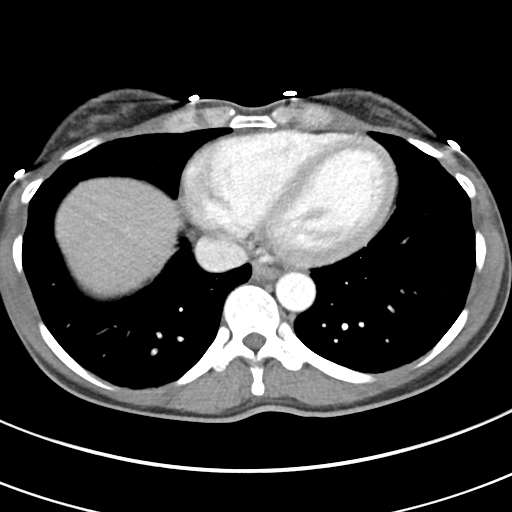

[Series 4: coronals abd pelvis 2.00 cor · coronal · 0.64mm/px · 3 of 103 slices shown]
[im 35/103  soft-tissue]
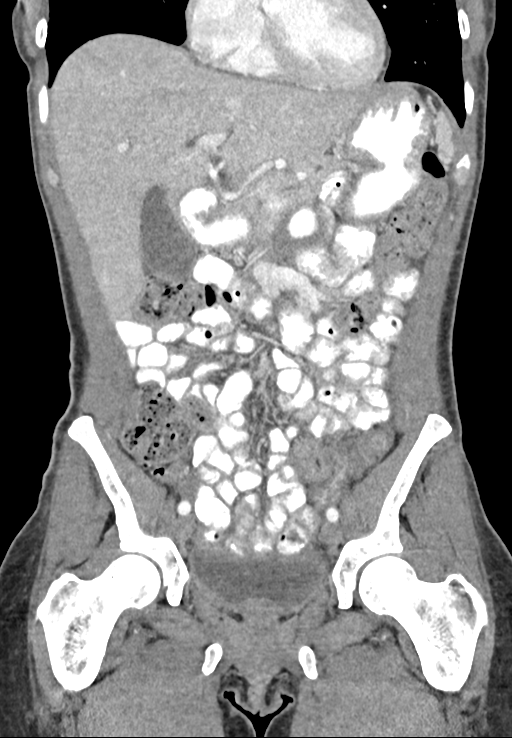
[im 46/103  soft-tissue]
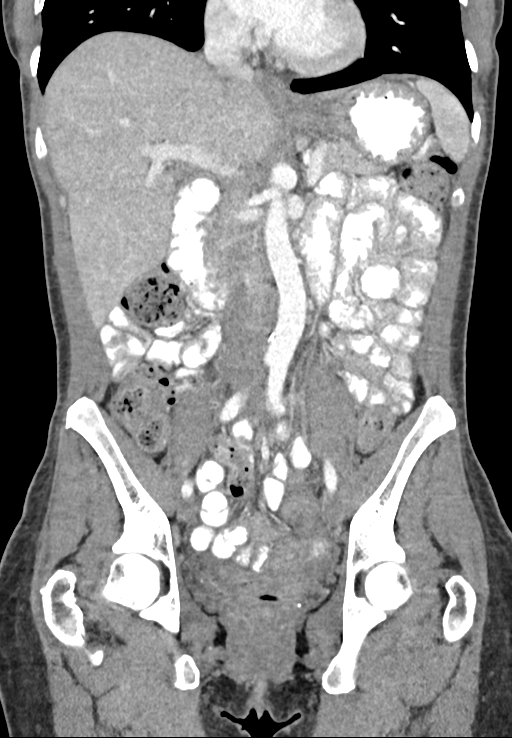
[im 57/103  soft-tissue]
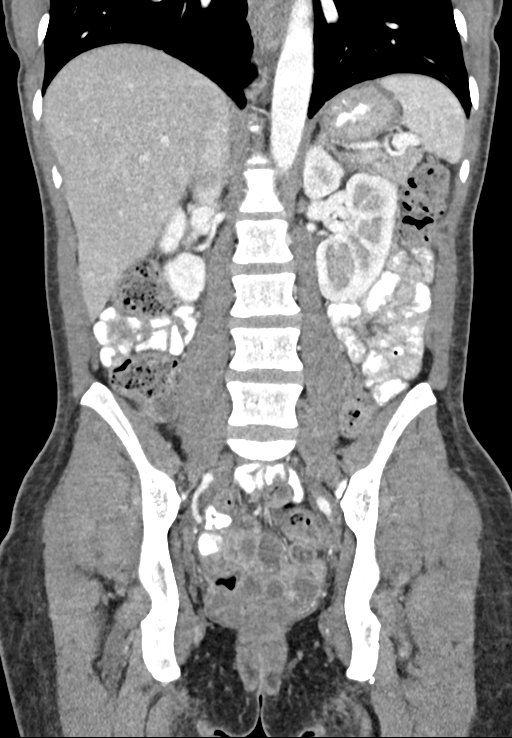

[15 of 46 positions shown; findings below may reference images not displayed]

FINDINGS: Lower chest: There is no edema or airspace opacity. There is mild
bibasilar atelectasis.

Hepatobiliary: No focal liver lesions are evident. Gallbladder wall
is not appreciably thickened. There is no biliary duct dilatation.

Pancreas: No pancreatic mass or inflammatory focus.

Spleen: No splenic lesions are evident.

Adrenals/Urinary Tract: Adrenals bilaterally appear normal. There is
no appreciable renal mass or hydronephrosis on either side. There is
no renal or ureteral calculus on either side. Urinary bladder is
midline with wall thickness within normal limits.

Stomach/Bowel: There is localized wall thickening in the midportion
of the stomach. Remainder of the stomach appears unremarkable. There
is diffuse stool throughout the colon. No small or large bowel wall
thickening evident. No evident bowel obstruction. The terminal ileum
appears normal. Appendix appears unremarkable. No free air or portal
venous air.

Vascular/Lymphatic: There is no abdominal aortic aneurysm. There is
mild aortic atherosclerosis. Other arterial vessels appear
unremarkable. Major venous structures appear patent. No evident
adenopathy in the abdomen or pelvis.

Reproductive: Uterus absent. Probable dominant follicle left ovary
measuring approximately 2 x 2 cm, a finding which does not require
additional imaging surveillance per consensus guidelines. No other
adnexal mass. Note that there has been interval resolution of
apparent right adnexal mass compared to prior study.

Other: No abscess or ascites evident in the abdomen or pelvis.

Musculoskeletal: No blastic or lytic bone lesions. No intramuscular
or abdominal wall lesions.
IMPRESSION: 1. Localized wall thickening and mid stomach. Question localized
gastritis given this appearance. Stomach otherwise appears
unremarkable with contrast present. No bowel obstruction. No abscess
in the abdomen or pelvis. Appendix appears normal.
2. No renal or ureteral calculus. No hydronephrosis. Urinary bladder
wall thickness normal.

## 2021-01-25 MED ORDER — IOHEXOL 300 MG/ML  SOLN
85.0000 mL | Freq: Once | INTRAMUSCULAR | Status: AC | PRN
  Administered 2021-01-25: 85 mL via INTRAVENOUS

## 2021-01-30 DIAGNOSIS — R109 Unspecified abdominal pain: Secondary | ICD-10-CM | POA: Diagnosis not present

## 2021-01-30 DIAGNOSIS — M545 Low back pain, unspecified: Secondary | ICD-10-CM | POA: Diagnosis not present

## 2021-01-30 DIAGNOSIS — Z131 Encounter for screening for diabetes mellitus: Secondary | ICD-10-CM | POA: Diagnosis not present

## 2021-01-30 DIAGNOSIS — Z13220 Encounter for screening for lipoid disorders: Secondary | ICD-10-CM | POA: Diagnosis not present

## 2021-01-30 DIAGNOSIS — I10 Essential (primary) hypertension: Secondary | ICD-10-CM | POA: Diagnosis not present

## 2021-01-30 DIAGNOSIS — D649 Anemia, unspecified: Secondary | ICD-10-CM | POA: Diagnosis not present

## 2021-01-30 DIAGNOSIS — K219 Gastro-esophageal reflux disease without esophagitis: Secondary | ICD-10-CM | POA: Diagnosis not present

## 2021-01-30 DIAGNOSIS — Z1389 Encounter for screening for other disorder: Secondary | ICD-10-CM | POA: Diagnosis not present

## 2021-02-07 DIAGNOSIS — Z79891 Long term (current) use of opiate analgesic: Secondary | ICD-10-CM | POA: Diagnosis not present

## 2021-02-07 DIAGNOSIS — M79651 Pain in right thigh: Secondary | ICD-10-CM | POA: Diagnosis not present

## 2021-02-07 DIAGNOSIS — G894 Chronic pain syndrome: Secondary | ICD-10-CM | POA: Diagnosis not present

## 2021-02-07 DIAGNOSIS — M79643 Pain in unspecified hand: Secondary | ICD-10-CM | POA: Diagnosis not present

## 2021-02-07 DIAGNOSIS — M545 Low back pain, unspecified: Secondary | ICD-10-CM | POA: Diagnosis not present

## 2021-02-07 DIAGNOSIS — Z79899 Other long term (current) drug therapy: Secondary | ICD-10-CM | POA: Diagnosis not present

## 2021-02-08 ENCOUNTER — Ambulatory Visit: Payer: BC Managed Care – PPO

## 2021-02-10 ENCOUNTER — Encounter: Payer: Self-pay | Admitting: Obstetrics and Gynecology

## 2021-02-22 DIAGNOSIS — F4542 Pain disorder with related psychological factors: Secondary | ICD-10-CM | POA: Diagnosis not present

## 2021-02-22 DIAGNOSIS — M792 Neuralgia and neuritis, unspecified: Secondary | ICD-10-CM | POA: Diagnosis not present

## 2021-02-22 DIAGNOSIS — G609 Hereditary and idiopathic neuropathy, unspecified: Secondary | ICD-10-CM | POA: Diagnosis not present

## 2021-02-22 DIAGNOSIS — G894 Chronic pain syndrome: Secondary | ICD-10-CM | POA: Diagnosis not present

## 2021-03-01 DIAGNOSIS — G894 Chronic pain syndrome: Secondary | ICD-10-CM | POA: Diagnosis not present

## 2021-03-01 DIAGNOSIS — Z79891 Long term (current) use of opiate analgesic: Secondary | ICD-10-CM | POA: Diagnosis not present

## 2021-03-01 DIAGNOSIS — M79651 Pain in right thigh: Secondary | ICD-10-CM | POA: Diagnosis not present

## 2021-03-01 DIAGNOSIS — R202 Paresthesia of skin: Secondary | ICD-10-CM | POA: Diagnosis not present

## 2021-03-01 DIAGNOSIS — Z79899 Other long term (current) drug therapy: Secondary | ICD-10-CM | POA: Diagnosis not present

## 2021-03-01 DIAGNOSIS — M545 Low back pain, unspecified: Secondary | ICD-10-CM | POA: Diagnosis not present

## 2021-03-07 DIAGNOSIS — R109 Unspecified abdominal pain: Secondary | ICD-10-CM | POA: Diagnosis not present

## 2021-03-07 DIAGNOSIS — R112 Nausea with vomiting, unspecified: Secondary | ICD-10-CM | POA: Diagnosis not present

## 2021-03-21 DIAGNOSIS — G5601 Carpal tunnel syndrome, right upper limb: Secondary | ICD-10-CM | POA: Diagnosis not present

## 2021-03-29 DIAGNOSIS — G5601 Carpal tunnel syndrome, right upper limb: Secondary | ICD-10-CM | POA: Diagnosis not present

## 2021-03-29 DIAGNOSIS — M545 Low back pain, unspecified: Secondary | ICD-10-CM | POA: Diagnosis not present

## 2021-03-29 DIAGNOSIS — G894 Chronic pain syndrome: Secondary | ICD-10-CM | POA: Diagnosis not present

## 2021-03-29 DIAGNOSIS — M79651 Pain in right thigh: Secondary | ICD-10-CM | POA: Diagnosis not present

## 2021-04-18 ENCOUNTER — Other Ambulatory Visit: Payer: Self-pay | Admitting: Pain Medicine

## 2021-04-18 DIAGNOSIS — G8929 Other chronic pain: Secondary | ICD-10-CM

## 2021-04-18 DIAGNOSIS — M5441 Lumbago with sciatica, right side: Secondary | ICD-10-CM

## 2021-04-24 DIAGNOSIS — R200 Anesthesia of skin: Secondary | ICD-10-CM | POA: Diagnosis not present

## 2021-04-24 DIAGNOSIS — M79641 Pain in right hand: Secondary | ICD-10-CM | POA: Diagnosis not present

## 2021-04-24 DIAGNOSIS — G5601 Carpal tunnel syndrome, right upper limb: Secondary | ICD-10-CM | POA: Diagnosis not present

## 2021-04-24 DIAGNOSIS — G894 Chronic pain syndrome: Secondary | ICD-10-CM | POA: Diagnosis not present

## 2021-04-29 ENCOUNTER — Ambulatory Visit
Admission: RE | Admit: 2021-04-29 | Discharge: 2021-04-29 | Disposition: A | Discharge status: Home / Self Care | Payer: BC Managed Care – PPO | Source: Ambulatory Visit | Attending: Pain Medicine | Admitting: Pain Medicine

## 2021-04-29 ENCOUNTER — Other Ambulatory Visit: Payer: Self-pay

## 2021-04-29 DIAGNOSIS — G8929 Other chronic pain: Secondary | ICD-10-CM

## 2021-04-29 DIAGNOSIS — M545 Low back pain, unspecified: Secondary | ICD-10-CM | POA: Diagnosis not present

## 2021-04-29 IMAGING — MR MR LUMBAR SPINE W/O CM
5 of 6 series · 27 of 48 positions shown · non-contrast
Comparison: CT abdomen pelvis [DATE]

CLINICAL DATA: Chronic lower back pain with right-sided sciatica

EXAM:
MRI LUMBAR SPINE WITHOUT CONTRAST
TECHNIQUE: Multiplanar, multisequence MR imaging of the lumbar spine was
performed. No intravenous contrast was administered.

[Series 4: T1 · sagittal · 4.0mm · 1.09mm/px · 5 of 15 slices shown (1 of 2)]
[im 1/15]
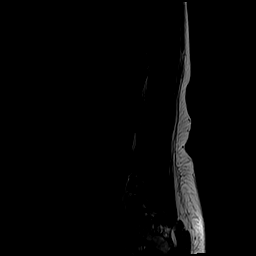
[im 4/15]
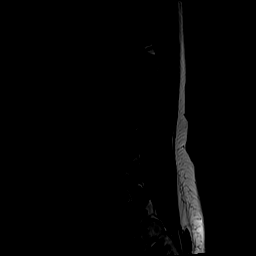
[im 8/15]
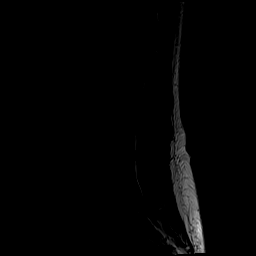
[im 11/15]
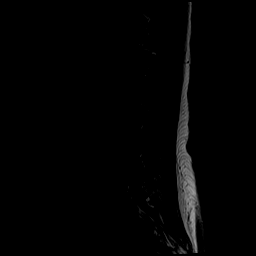
[im 15/15]
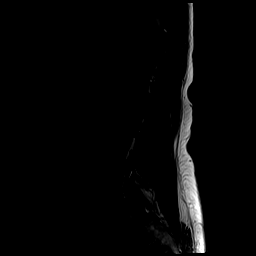

[Series 5: STIR · sagittal · 4.0mm · 1.09mm/px · 1 of 15 slices shown]
[im 1/15]
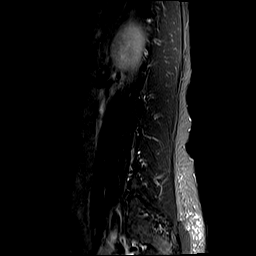

[Series 6: T2 · sagittal · 4.0mm · 1.09mm/px · 5 of 15 slices shown (1 of 2)]
[im 1/15]
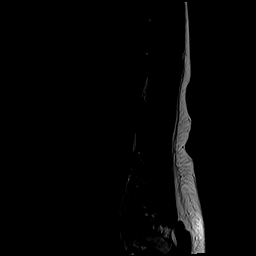
[im 4/15]
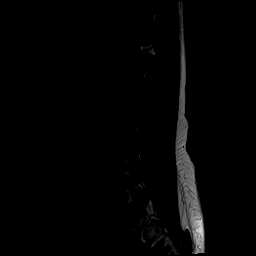
[im 8/15]
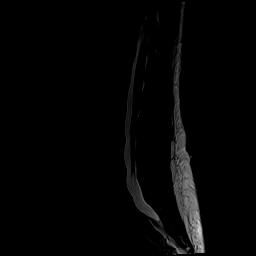
[im 11/15]
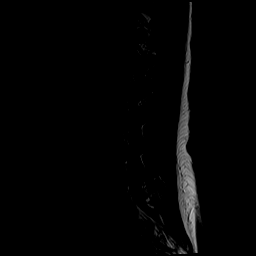
[im 15/15]
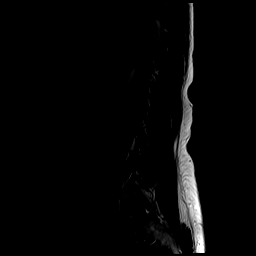

[Series 8: T2 · axial · 4.0mm · 0.39mm/px · z∈[-215,+1]mm · 8 of 43 slices shown (2 of 2)]
[im 1/43]
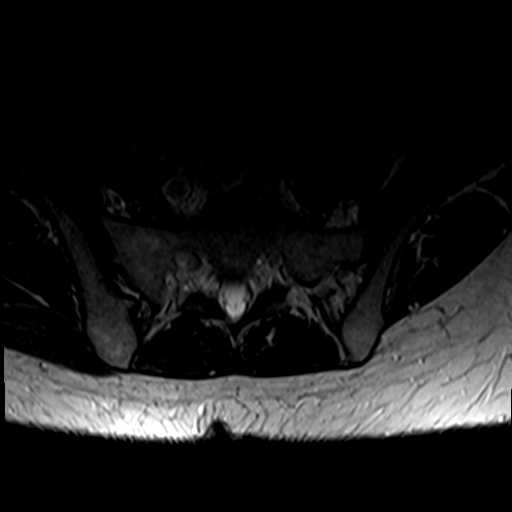
[im 7/43]
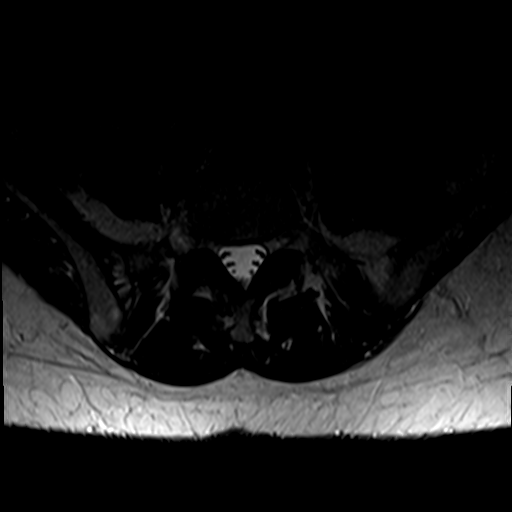
[im 13/43]
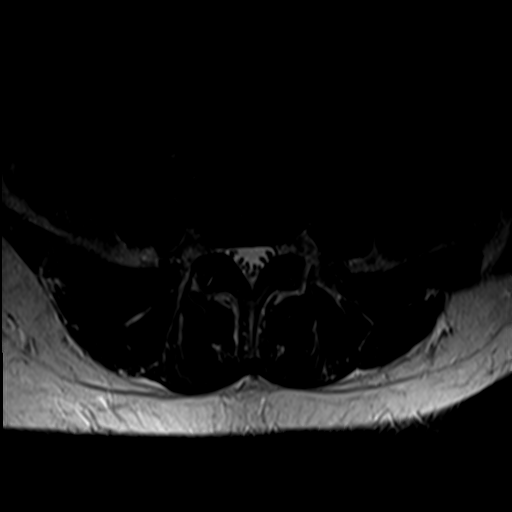
[im 20/43]
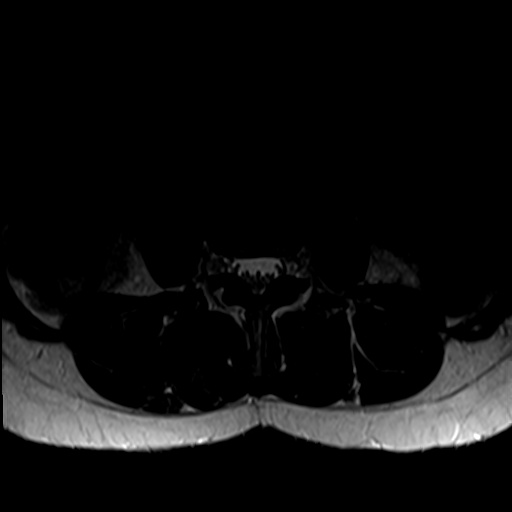
[im 23/43]
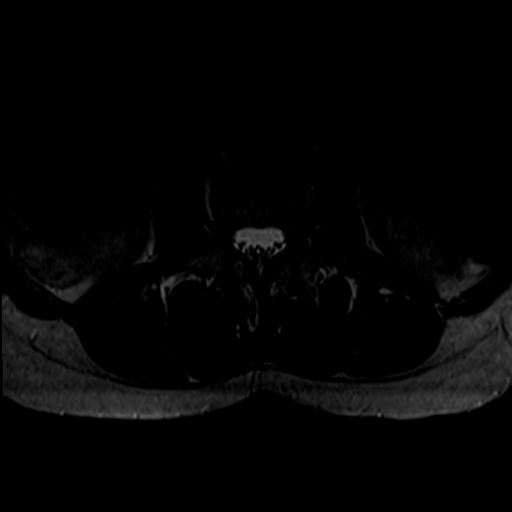
[im 30/43]
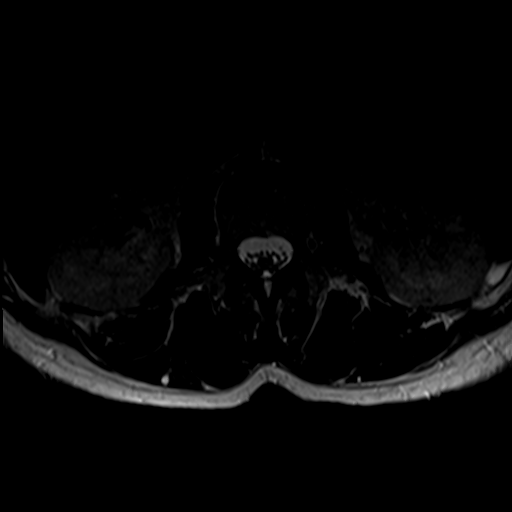
[im 36/43]
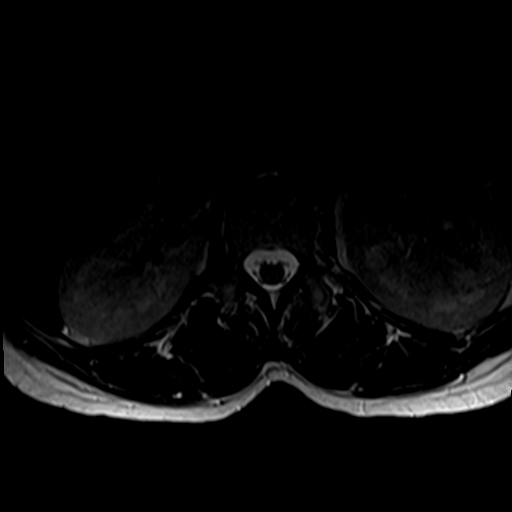
[im 43/43]
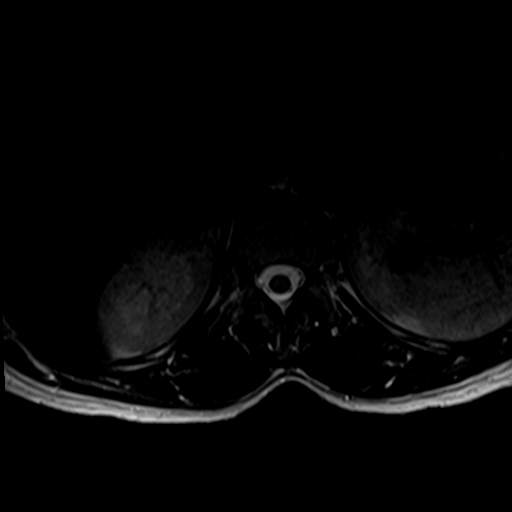

[Series 9: T1 · axial · 4.0mm · 0.39mm/px · z∈[-215,+1]mm · 8 of 43 slices shown (2 of 2)]
[im 1/43]
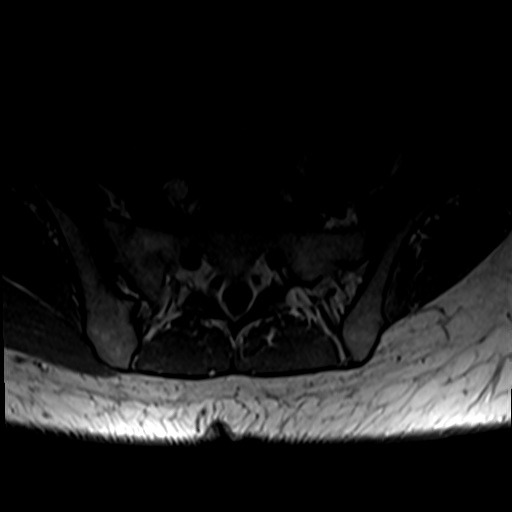
[im 7/43]
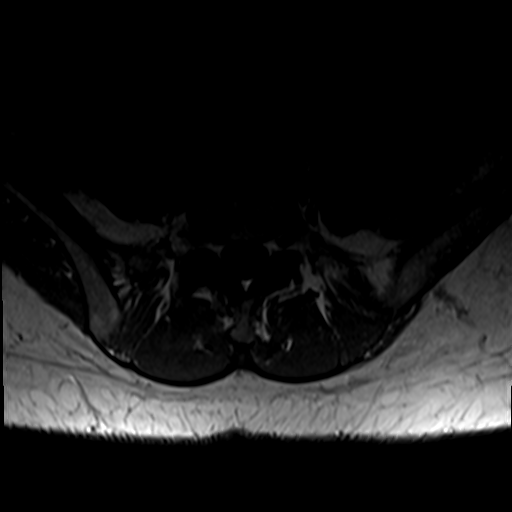
[im 13/43]
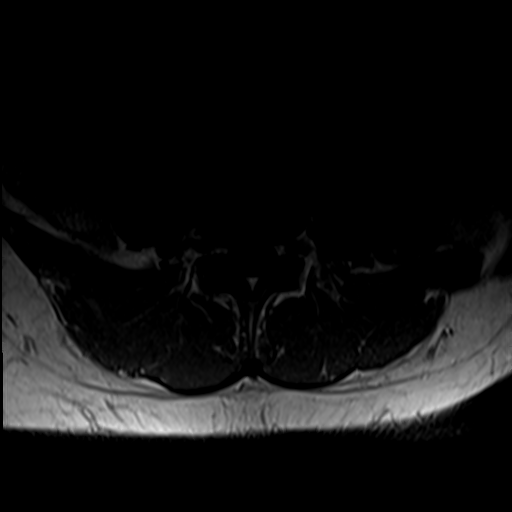
[im 20/43]
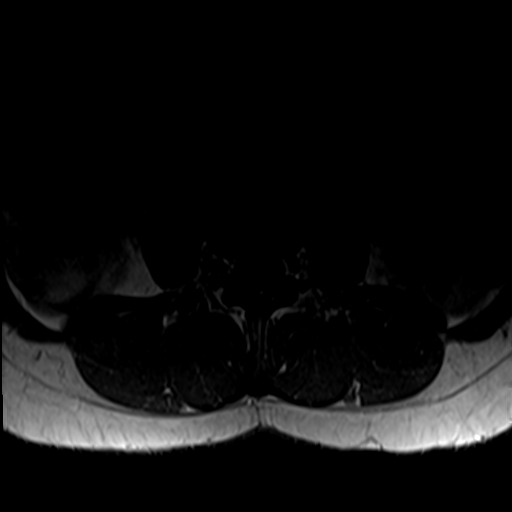
[im 23/43]
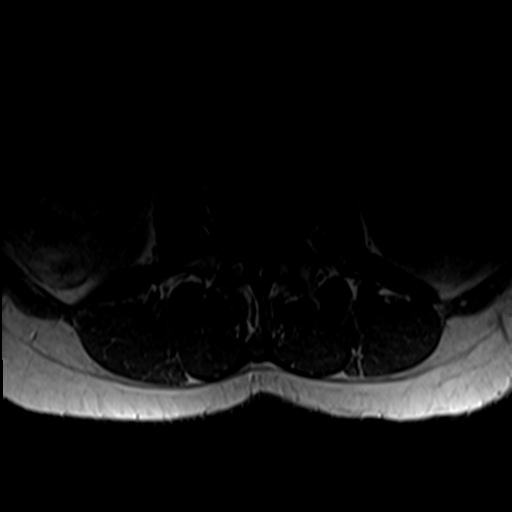
[im 30/43]
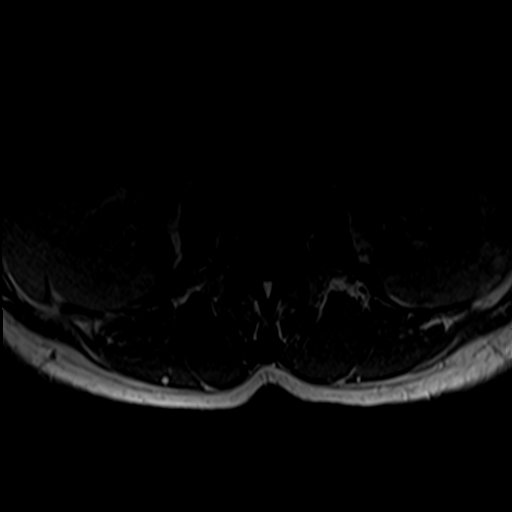
[im 36/43]
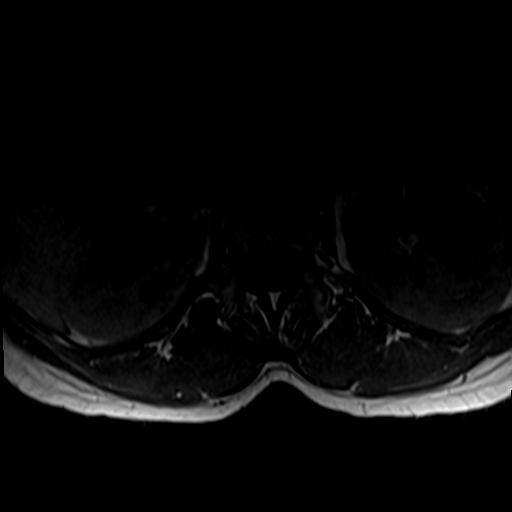
[im 43/43]
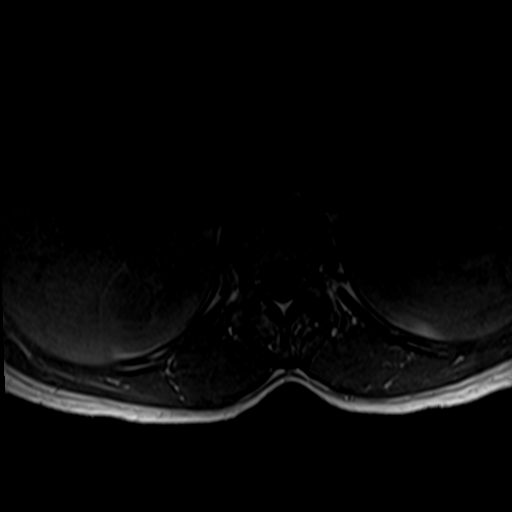

[27 of 48 positions shown; findings below may reference images not displayed]

FINDINGS: Segmentation:  Standard.

Alignment:  Physiologic.

Vertebrae: No fracture, evidence of discitis, or aggressive bone
lesion.

Conus medullaris and cauda equina: Conus extends to the L2 level.
Conus and cauda equina appear normal.

Paraspinal and other soft tissues: Unremarkable.

Disc levels:

T12-L1: No significant spinal canal or neural foraminal narrowing.

L1-L2: No significant spinal canal or neural foraminal narrowing.

L2-L3: No significant spinal canal or neural foraminal narrowing.

L3-L4: Minimal disc bulging with prominent ligament flavum
hypertrophy results in mild spinal canal narrowing. No significant
neural foraminal narrowing.

L4-L5: Mild disc bulging with prominent ligament flavum hypertrophy
results in mild spinal canal narrowing and mild right neural
foraminal narrowing.

L5-S1: No significant spinal canal or neural foraminal narrowing.
IMPRESSION: Mild spinal canal narrowing at L3-L4 and L4-L5 and mild right neural
foraminal narrowing at L4-L5 due to disc bulging and ligamentum
flavum hypertrophy.

## 2021-05-22 ENCOUNTER — Inpatient Hospital Stay: Payer: BC Managed Care – PPO | Attending: Oncology

## 2021-05-22 ENCOUNTER — Inpatient Hospital Stay: Payer: BC Managed Care – PPO | Admitting: Nurse Practitioner

## 2021-05-23 ENCOUNTER — Ambulatory Visit
Admission: RE | Admit: 2021-05-23 | Discharge: 2021-05-23 | Disposition: A | Discharge status: Home / Self Care | Payer: BC Managed Care – PPO | Source: Ambulatory Visit | Attending: Pain Medicine | Admitting: Pain Medicine

## 2021-05-23 ENCOUNTER — Other Ambulatory Visit: Payer: Self-pay

## 2021-05-23 ENCOUNTER — Other Ambulatory Visit: Payer: Self-pay | Admitting: Pain Medicine

## 2021-05-23 DIAGNOSIS — M79641 Pain in right hand: Secondary | ICD-10-CM

## 2021-05-23 IMAGING — CR DG WRIST COMPLETE 3+V*R*
4 series · 4 of 4 positions shown · non-contrast
Comparison: No recent.

CLINICAL DATA: Right hand pain.

EXAM:
RIGHT WRIST - COMPLETE 3+ VIEW

[x wrist pa right]
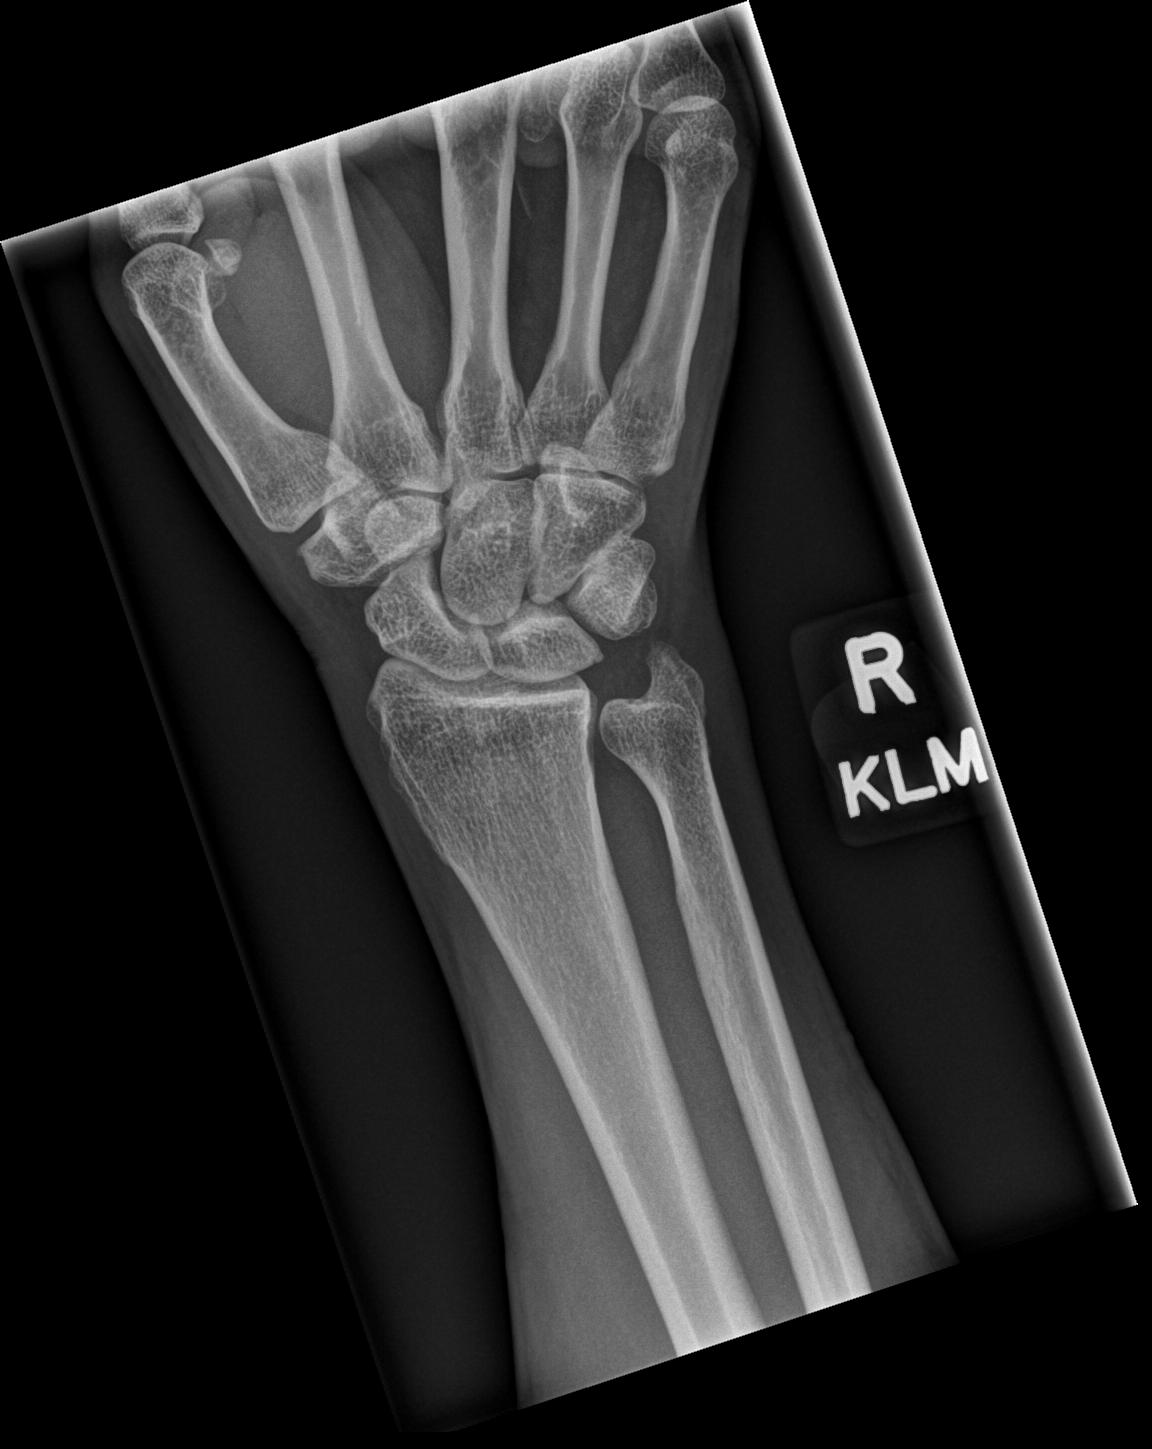

[x wrist obl right]
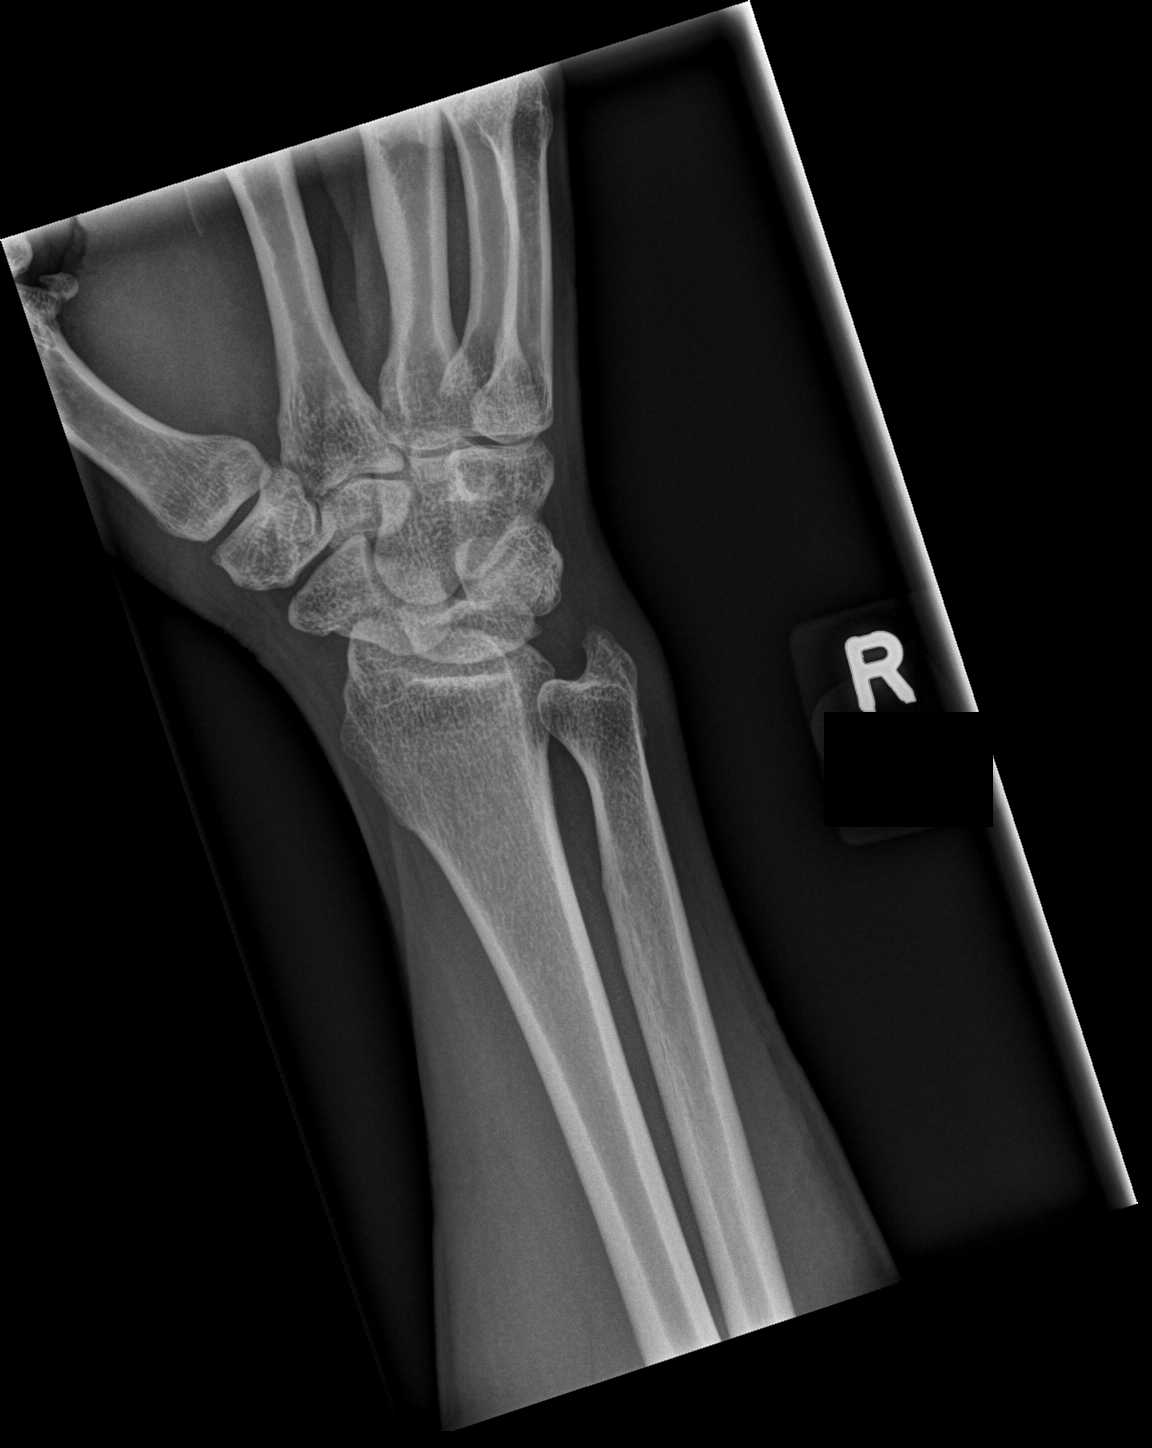

[x wrist lat right (1 of 2)]
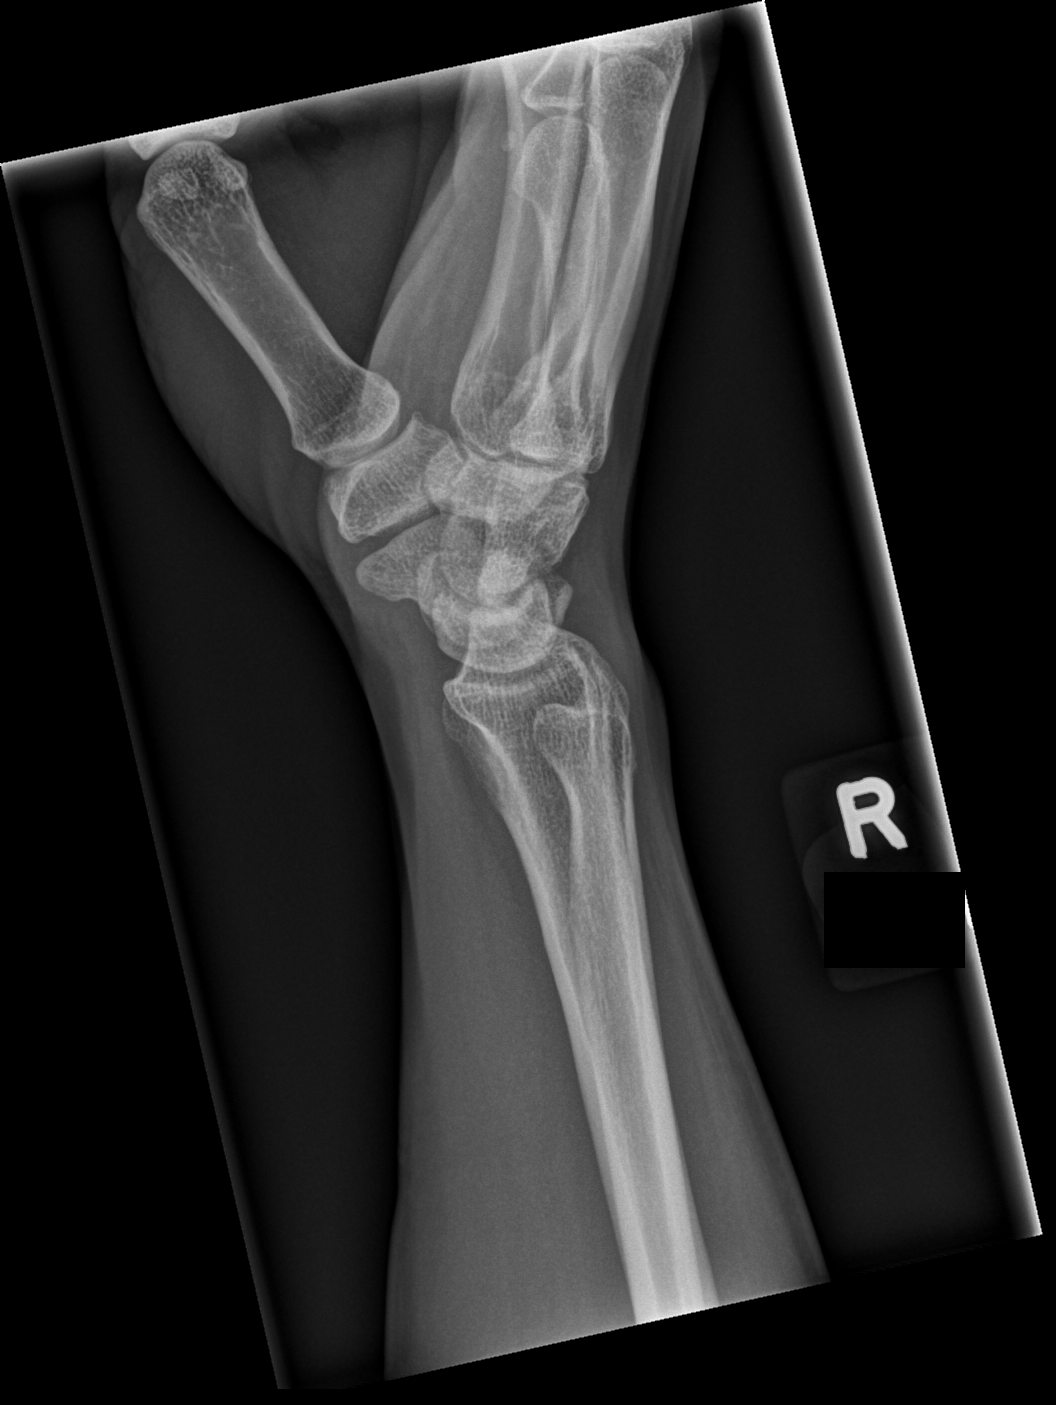

[x wrist lat right (2 of 2)]
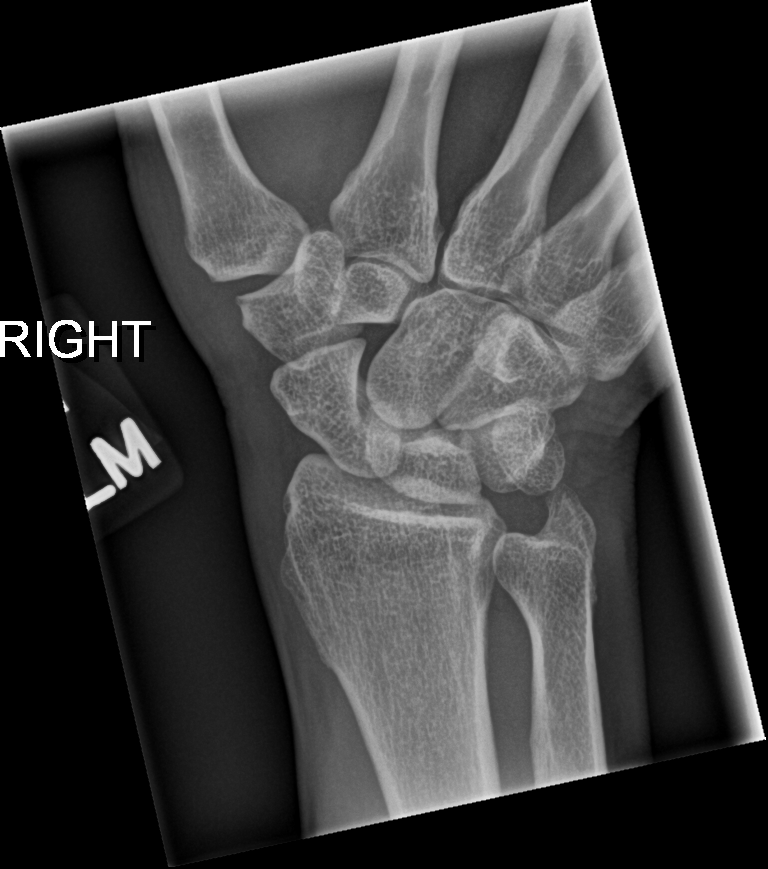

[4 of 4 positions shown; findings below may reference images not displayed]

FINDINGS: No acute bony or joint abnormality. No evidence of fracture or
dislocation.
IMPRESSION: No acute abnormality.

## 2021-05-29 DIAGNOSIS — Z79891 Long term (current) use of opiate analgesic: Secondary | ICD-10-CM | POA: Diagnosis not present

## 2021-05-29 DIAGNOSIS — G5601 Carpal tunnel syndrome, right upper limb: Secondary | ICD-10-CM | POA: Diagnosis not present

## 2021-05-29 DIAGNOSIS — G894 Chronic pain syndrome: Secondary | ICD-10-CM | POA: Diagnosis not present

## 2021-05-29 DIAGNOSIS — M545 Low back pain, unspecified: Secondary | ICD-10-CM | POA: Diagnosis not present

## 2021-05-29 DIAGNOSIS — R202 Paresthesia of skin: Secondary | ICD-10-CM | POA: Diagnosis not present

## 2021-05-29 DIAGNOSIS — Z79899 Other long term (current) drug therapy: Secondary | ICD-10-CM | POA: Diagnosis not present

## 2021-06-13 ENCOUNTER — Other Ambulatory Visit: Payer: Self-pay | Admitting: Pain Medicine

## 2021-06-13 DIAGNOSIS — M542 Cervicalgia: Secondary | ICD-10-CM

## 2021-06-13 DIAGNOSIS — M79641 Pain in right hand: Secondary | ICD-10-CM

## 2021-06-13 DIAGNOSIS — R202 Paresthesia of skin: Secondary | ICD-10-CM

## 2021-06-13 DIAGNOSIS — R200 Anesthesia of skin: Secondary | ICD-10-CM

## 2021-06-13 DIAGNOSIS — M25531 Pain in right wrist: Secondary | ICD-10-CM

## 2021-06-27 ENCOUNTER — Encounter: Payer: Self-pay | Admitting: Oncology

## 2021-06-28 DIAGNOSIS — M4727 Other spondylosis with radiculopathy, lumbosacral region: Secondary | ICD-10-CM | POA: Diagnosis not present

## 2021-06-28 DIAGNOSIS — M542 Cervicalgia: Secondary | ICD-10-CM | POA: Diagnosis not present

## 2021-06-28 DIAGNOSIS — G5601 Carpal tunnel syndrome, right upper limb: Secondary | ICD-10-CM | POA: Diagnosis not present

## 2021-06-28 DIAGNOSIS — G894 Chronic pain syndrome: Secondary | ICD-10-CM | POA: Diagnosis not present

## 2021-07-01 ENCOUNTER — Ambulatory Visit
Admission: RE | Admit: 2021-07-01 | Discharge: 2021-07-01 | Disposition: A | Discharge status: Home / Self Care | Payer: BC Managed Care – PPO | Source: Ambulatory Visit | Attending: Pain Medicine | Admitting: Pain Medicine

## 2021-07-01 ENCOUNTER — Other Ambulatory Visit: Payer: Self-pay

## 2021-07-01 DIAGNOSIS — R202 Paresthesia of skin: Secondary | ICD-10-CM

## 2021-07-01 DIAGNOSIS — M542 Cervicalgia: Secondary | ICD-10-CM

## 2021-07-01 DIAGNOSIS — M79641 Pain in right hand: Secondary | ICD-10-CM

## 2021-07-01 DIAGNOSIS — M25531 Pain in right wrist: Secondary | ICD-10-CM

## 2021-07-01 DIAGNOSIS — M4802 Spinal stenosis, cervical region: Secondary | ICD-10-CM | POA: Diagnosis not present

## 2021-07-01 IMAGING — MR MR CERVICAL SPINE W/O CM
4 of 5 series · 27 of 48 positions shown · non-contrast
Comparison: None.

CLINICAL DATA: Left-sided neck pain with numbness in the left hand
and leg for 2 weeks

EXAM:
MRI CERVICAL SPINE WITHOUT CONTRAST
TECHNIQUE: Multiplanar, multisequence MR imaging of the cervical spine was
performed. No intravenous contrast was administered.

[Series 5: T2 · sagittal · 3.0mm · 0.55mm/px · 6 of 15 slices shown (1 of 2)]
[im 1/15]
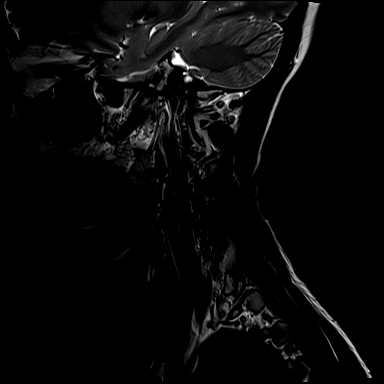
[im 3/15]
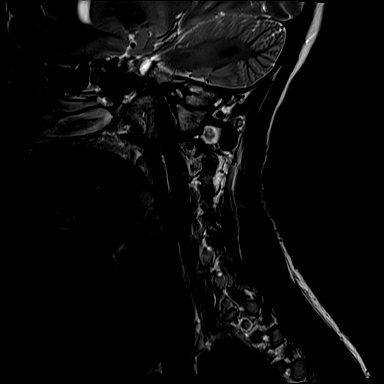
[im 6/15]
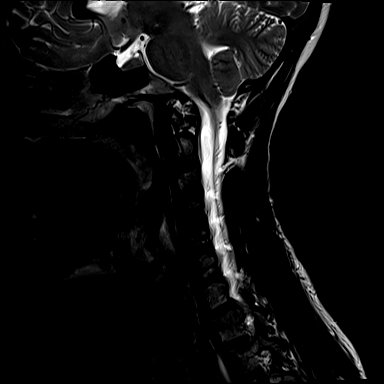
[im 9/15]
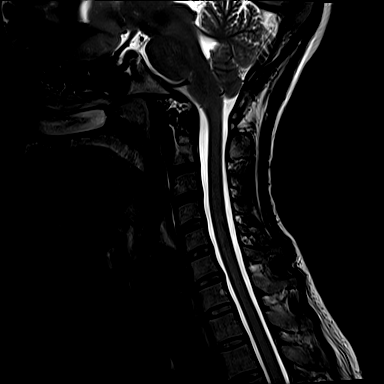
[im 12/15]
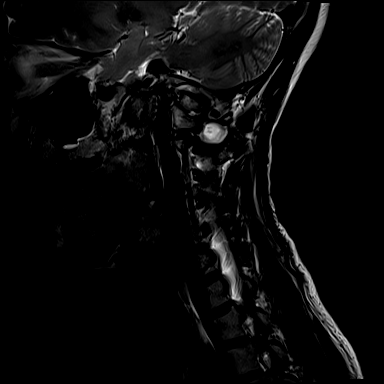
[im 15/15]
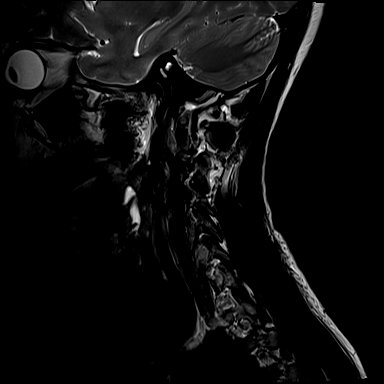

[Series 6: STIR · sagittal · 3.0mm · 0.33mm/px · 6 of 15 slices shown]
[im 1/15]
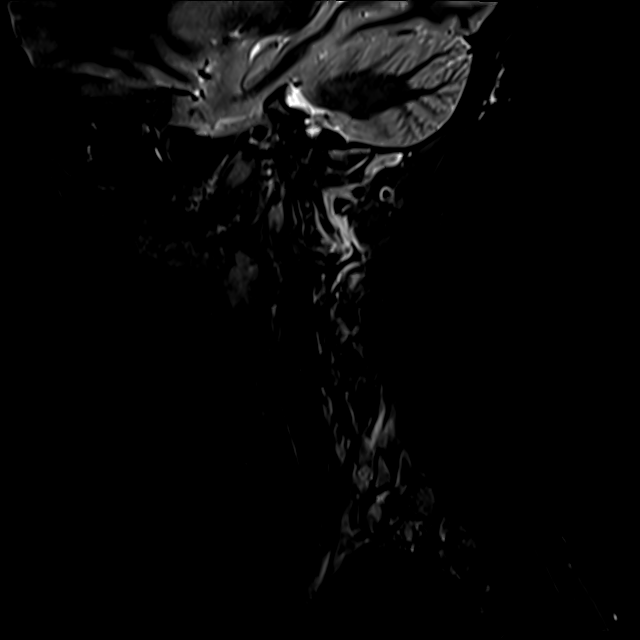
[im 3/15]
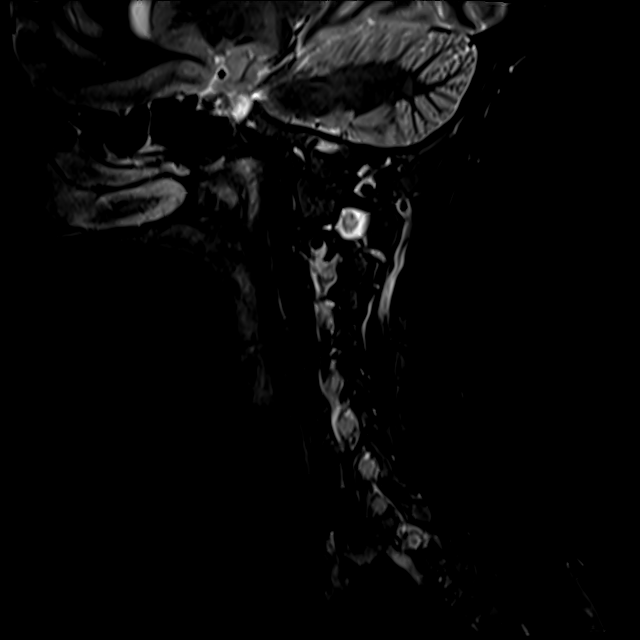
[im 5/15]
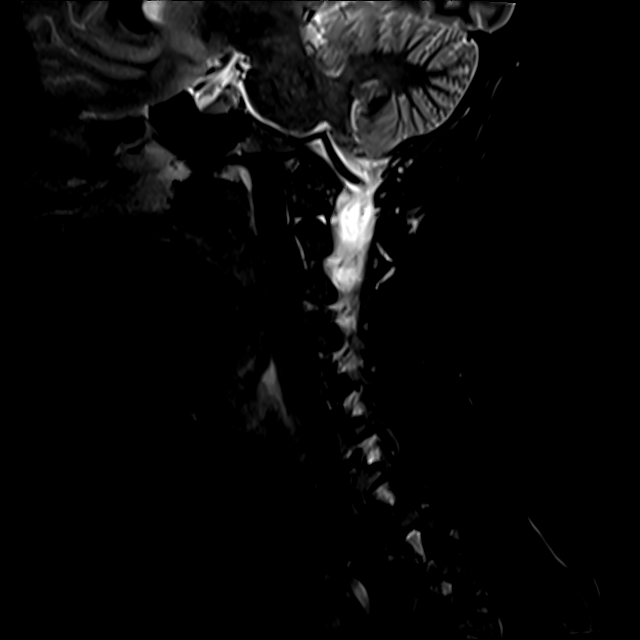
[im 8/15]
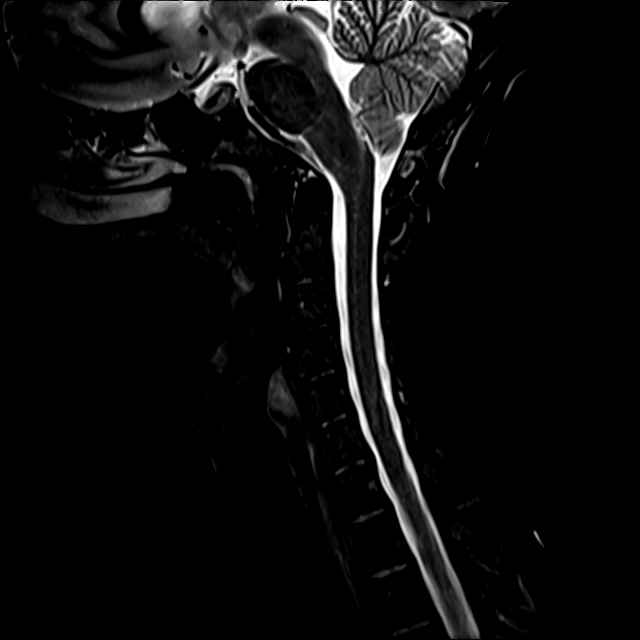
[im 10/15]
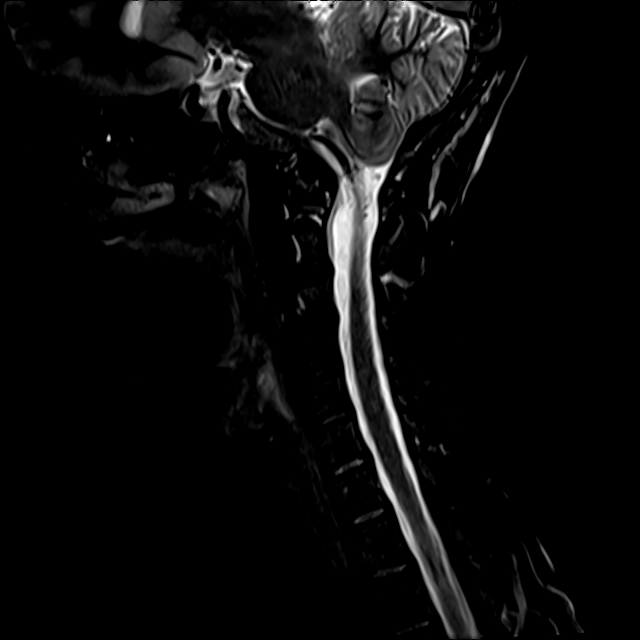
[im 12/15]
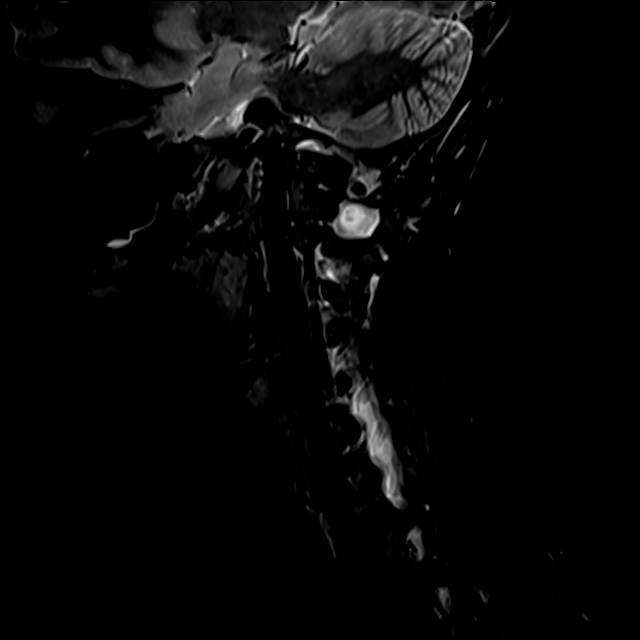

[Series 7: T1 · sagittal · 3.0mm · 0.66mm/px · 7 of 15 slices shown]
[im 1/15]
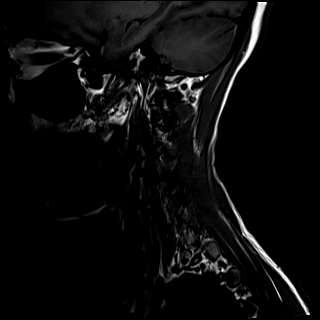
[im 3/15]
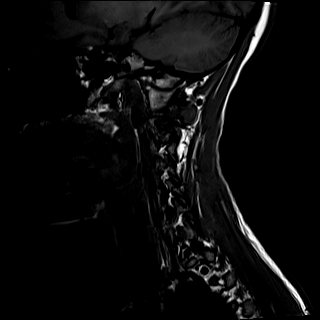
[im 5/15]
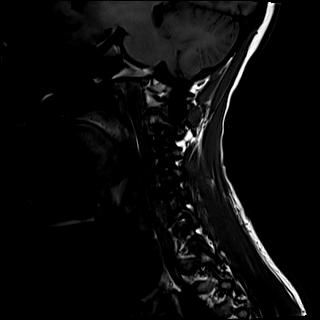
[im 8/15]
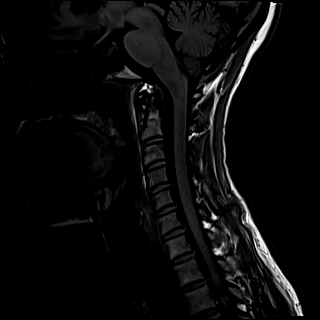
[im 10/15]
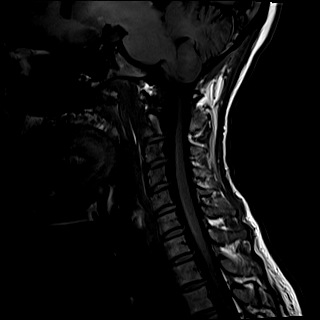
[im 12/15]
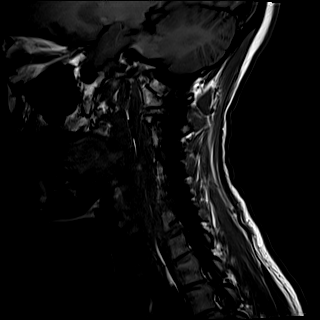
[im 15/15]
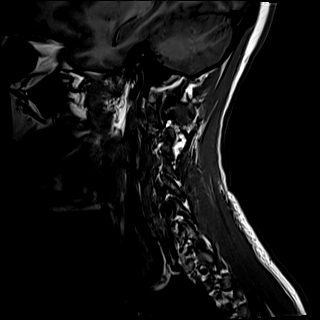

[Series 8: T2 · axial · 3.0mm · 0.50mm/px · z∈[-35,+58]mm · 8 of 31 slices shown (2 of 2)]
[im 1/31]
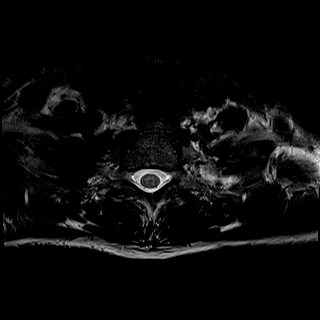
[im 5/31]
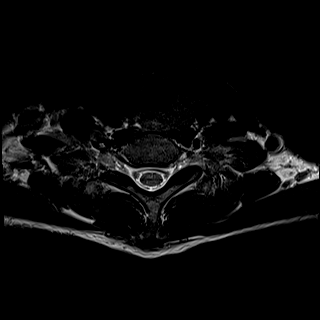
[im 10/31]
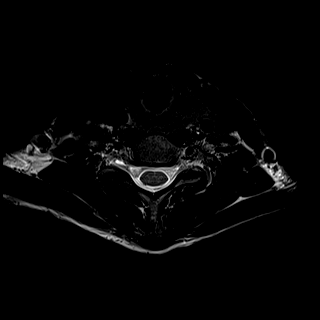
[im 14/31]
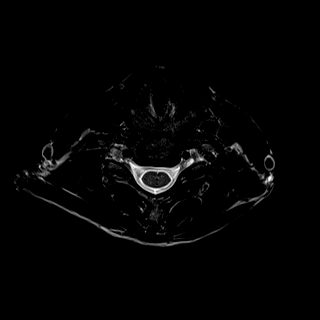
[im 17/31]
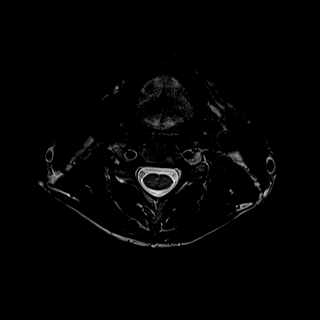
[im 21/31]
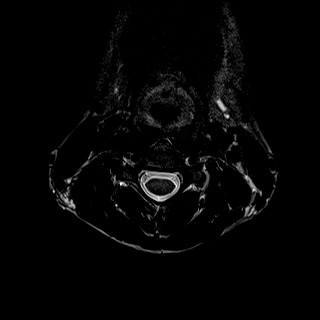
[im 26/31]
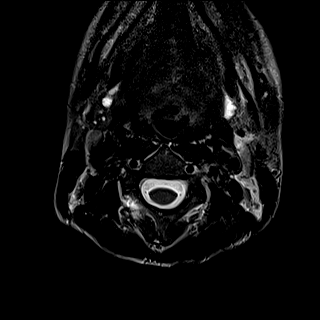
[im 31/31]
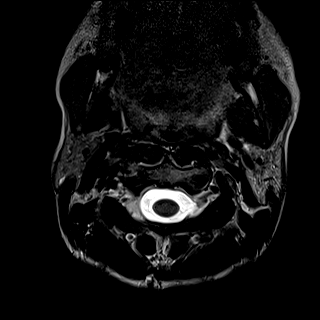

[27 of 48 positions shown; findings below may reference images not displayed]

FINDINGS: Alignment: Physiologic.

Vertebrae: No acute fracture, evidence of discitis, or bone lesion.

Cord: Normal signal and morphology.

Posterior Fossa, vertebral arteries, paraspinal tissues: Posterior
fossa demonstrates no focal abnormality. Vertebral artery flow voids
are maintained. Paraspinal soft tissues are unremarkable.

Disc levels:

Discs: Disc spaces are maintained.

C2-3: No significant disc bulge. No neural foraminal stenosis. No
central canal stenosis.

C3-4: No significant disc bulge. Moderate right foraminal narrowing.
No left foraminal stenosis. No central canal stenosis.

C4-5: No significant disc bulge. No neural foraminal stenosis. No
central canal stenosis.

C5-6: No significant disc bulge. No neural foraminal stenosis. No
central canal stenosis.

C6-7: Mild broad-based disc bulge. No foraminal or central canal
stenosis.

C7-T1: No significant disc bulge. No neural foraminal stenosis. No
central canal stenosis.
IMPRESSION: 1. No acute injury of the cervical spine.
2. No significant cervical spine disc protrusion.
3. At C3-4 there is moderate right foraminal stenosis.

## 2021-07-26 DIAGNOSIS — M542 Cervicalgia: Secondary | ICD-10-CM | POA: Diagnosis not present

## 2021-07-26 DIAGNOSIS — R200 Anesthesia of skin: Secondary | ICD-10-CM | POA: Diagnosis not present

## 2021-07-26 DIAGNOSIS — G5601 Carpal tunnel syndrome, right upper limb: Secondary | ICD-10-CM | POA: Diagnosis not present

## 2021-07-26 DIAGNOSIS — G894 Chronic pain syndrome: Secondary | ICD-10-CM | POA: Diagnosis not present

## 2021-08-06 ENCOUNTER — Emergency Department: Payer: BC Managed Care – PPO

## 2021-08-06 ENCOUNTER — Other Ambulatory Visit: Payer: Self-pay

## 2021-08-06 ENCOUNTER — Emergency Department
Admission: EM | Admit: 2021-08-06 | Discharge: 2021-08-06 | Disposition: A | Discharge status: Home / Self Care | Payer: BC Managed Care – PPO | Attending: Emergency Medicine | Admitting: Emergency Medicine

## 2021-08-06 ENCOUNTER — Encounter: Payer: Self-pay | Admitting: Emergency Medicine

## 2021-08-06 DIAGNOSIS — R911 Solitary pulmonary nodule: Secondary | ICD-10-CM | POA: Insufficient documentation

## 2021-08-06 DIAGNOSIS — Y92410 Unspecified street and highway as the place of occurrence of the external cause: Secondary | ICD-10-CM | POA: Insufficient documentation

## 2021-08-06 DIAGNOSIS — R188 Other ascites: Secondary | ICD-10-CM | POA: Diagnosis not present

## 2021-08-06 DIAGNOSIS — Z5321 Procedure and treatment not carried out due to patient leaving prior to being seen by health care provider: Secondary | ICD-10-CM | POA: Insufficient documentation

## 2021-08-06 DIAGNOSIS — Z041 Encounter for examination and observation following transport accident: Secondary | ICD-10-CM | POA: Diagnosis not present

## 2021-08-06 DIAGNOSIS — J439 Emphysema, unspecified: Secondary | ICD-10-CM | POA: Diagnosis not present

## 2021-08-06 DIAGNOSIS — Z043 Encounter for examination and observation following other accident: Secondary | ICD-10-CM | POA: Insufficient documentation

## 2021-08-06 DIAGNOSIS — M549 Dorsalgia, unspecified: Secondary | ICD-10-CM | POA: Diagnosis not present
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
## 2021-08-06 NOTE — ED Notes (Signed)
No answer when called to room
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
## 2021-08-08 ENCOUNTER — Telehealth: Payer: Self-pay | Admitting: Emergency Medicine

## 2021-08-08 DIAGNOSIS — R200 Anesthesia of skin: Secondary | ICD-10-CM | POA: Diagnosis not present

## 2021-08-08 DIAGNOSIS — M79609 Pain in unspecified limb: Secondary | ICD-10-CM | POA: Diagnosis not present

## 2021-08-08 DIAGNOSIS — M545 Low back pain, unspecified: Secondary | ICD-10-CM | POA: Diagnosis not present

## 2021-08-08 NOTE — Telephone Encounter (Signed)
Called patient due to left emergency department before provider exam to inquire about condition and follow up plans. She has an appt with her doctor already and will follow up.  Advised that she needs to make sure her doctor reviews all the results from here and that there are recommendations for another CT in a few months--so her pcp can arranged that.  She agrees.

## 2021-08-28 DIAGNOSIS — M4727 Other spondylosis with radiculopathy, lumbosacral region: Secondary | ICD-10-CM | POA: Diagnosis not present

## 2021-08-28 DIAGNOSIS — G5601 Carpal tunnel syndrome, right upper limb: Secondary | ICD-10-CM | POA: Diagnosis not present

## 2021-08-28 DIAGNOSIS — R202 Paresthesia of skin: Secondary | ICD-10-CM | POA: Diagnosis not present

## 2021-08-28 DIAGNOSIS — R109 Unspecified abdominal pain: Secondary | ICD-10-CM | POA: Diagnosis not present

## 2021-09-25 DIAGNOSIS — M79651 Pain in right thigh: Secondary | ICD-10-CM | POA: Diagnosis not present

## 2021-09-25 DIAGNOSIS — Z79891 Long term (current) use of opiate analgesic: Secondary | ICD-10-CM | POA: Diagnosis not present

## 2021-09-25 DIAGNOSIS — G894 Chronic pain syndrome: Secondary | ICD-10-CM | POA: Diagnosis not present

## 2021-09-25 DIAGNOSIS — M47812 Spondylosis without myelopathy or radiculopathy, cervical region: Secondary | ICD-10-CM | POA: Diagnosis not present

## 2021-09-25 DIAGNOSIS — Z79899 Other long term (current) drug therapy: Secondary | ICD-10-CM | POA: Diagnosis not present

## 2021-09-25 DIAGNOSIS — M4727 Other spondylosis with radiculopathy, lumbosacral region: Secondary | ICD-10-CM | POA: Diagnosis not present

## 2021-10-10 ENCOUNTER — Ambulatory Visit
Admission: EM | Admit: 2021-10-10 | Discharge: 2021-10-10 | Disposition: A | Discharge status: Home / Self Care | Payer: BC Managed Care – PPO | Attending: Student | Admitting: Student

## 2021-10-10 ENCOUNTER — Other Ambulatory Visit: Payer: Self-pay

## 2021-10-10 ENCOUNTER — Encounter: Payer: Self-pay | Admitting: Emergency Medicine

## 2021-10-10 DIAGNOSIS — Z113 Encounter for screening for infections with a predominantly sexual mode of transmission: Secondary | ICD-10-CM | POA: Diagnosis not present

## 2021-10-10 DIAGNOSIS — Z202 Contact with and (suspected) exposure to infections with a predominantly sexual mode of transmission: Secondary | ICD-10-CM | POA: Diagnosis not present

## 2021-10-10 MED ORDER — FLUCONAZOLE 150 MG PO TABS: 150.0000 mg | ORAL_TABLET | Freq: Every day | ORAL | 0 refills | Status: AC

## 2021-10-10 MED ORDER — DOXYCYCLINE HYCLATE 100 MG PO CAPS: 100.0000 mg | ORAL_CAPSULE | Freq: Two times a day (BID) | ORAL | 0 refills | Status: AC

## 2021-10-10 MED ORDER — CEFTRIAXONE SODIUM 500 MG IJ SOLR
500.0000 mg | Freq: Once | INTRAMUSCULAR | Status: AC
  Administered 2021-10-10: 500 mg via INTRAMUSCULAR

## 2021-10-10 NOTE — Discharge Instructions (Addendum)
-  We treated for gonorrhea with a shot of Rocephin -For chlamydia, Doxycycline twice daily for 7 days.  Make sure to wear sunscreen while spending time outside while on this medication as it can increase your chance of sunburn. You can take this medication with food if you have a sensitive stomach. -We have sent testing for sexually transmitted infections. We will notify you of any positive results once they are received. If required, we will prescribe any medications you might need. Please refrain from all sexual activity until treatment is complete.  -Seek additional medical attention if you develop fevers/chills, new/worsening abdominal pain, new/worsening vaginal discomfort/discharge, etc.

## 2021-10-10 NOTE — ED Triage Notes (Signed)
Pt advised by her partner that he tested positive for Chlamydia today.

## 2021-10-10 NOTE — ED Provider Notes (Addendum)
Roderic Palau    CSN: [id number] Arrival date & time: 10/10/21  1818      History   Chief Complaint Chief Complaint  Patient presents with   Exposure to STD    HPI Shirley [name] is a 46 y.o. female presenting for STI screening/ exposure to chlamydia. Medical history pyelo. States female partner informed her today that he is positive for chlamydia. Denies symptoms. Denies hematuria, dysuria, frequency, urgency, back pain, n/v/d/abd pain, fevers/chills, abdnormal vaginal discharge.    HPI  Past Medical History:  Diagnosis Date   Anemia    Asthma    Bacteremia    Blood dyscrasia    has to apply a lot of pressure to stop bleeding   GERD (gastroesophageal reflux disease)    Heart murmur    with pregnancy   History of hiatal hernia    Hypertension    4-5 years ago, no meds   Kidney infection    patient states I might have a kidney infection   Migraine    migraines 1-2x/month   Pyelonephritis    Seizure ([place]) 05/09/2017   due to low potassium   Wears dentures    partial upper and lower    Patient Active Problem List   Diagnosis Date Noted   Endometriosis 10/24/2020   Acute blood loss anemia 10/24/2020   Hypoxia 10/24/2020   Acute pelvic pain 10/18/2020   Iron deficiency anemia 08/10/2019   Acute peptic ulcer of stomach    Dysphagia, idiopathic    Nausea    Abnormal CT scan, colon    Pyelonephritis 08/03/2017   Bacteremia 08/03/2017   Hypokalemia 08/03/2017   Tobacco use 08/03/2017   Acute postoperative pain 07/31/2016   Menometrorrhagia 04/09/2016   Anemia 04/09/2016   Abnormal uterine bleeding (AUB) 04/09/2016   Essential hypertension 11/17/2009    Past Surgical History:  Procedure Laterality Date   ABDOMINAL HERNIA REPAIR     CESAREAN SECTION     x 4   CESAREAN SECTION     x 4   COLONOSCOPY WITH PROPOFOL N/A 07/24/2019   Procedure: COLONOSCOPY WITH PROPOFOL;  Surgeon: Virgel Manifold, MD;  Location: [facility];   Service: Endoscopy;  Laterality: N/A;   DILATATION & CURETTAGE/HYSTEROSCOPY WITH MYOSURE N/A 10/11/2020   Procedure: DILATATION & CURETTAGE/HYSTEROSCOPY WITH MYOSURE LYSIS OF ADHESIONS;  Surgeon: Benjaman Kindler, MD;  Location: [facility];  Service: Gynecology;  Laterality: N/A;   ENDOMETRIAL ABLATION  07/31/2016   ESOPHAGOGASTRODUODENOSCOPY (EGD) WITH PROPOFOL N/A 07/24/2019   Procedure: ESOPHAGOGASTRODUODENOSCOPY (EGD) WITH PROPOFOL;  Surgeon: Virgel Manifold, MD;  Location: [facility];  Service: Endoscopy;  Laterality: N/A;   HYSTERECTOMY ABDOMINAL WITH SALPINGECTOMY Bilateral 10/18/2020   Procedure: HYSTERECTOMY ABDOMINAL WITH SALPINGECTOMY;  Surgeon: Benjaman Kindler, MD;  Location: [facility];  Service: Gynecology;  Laterality: Bilateral;   HYSTEROSCOPY  07/31/2016   Procedure: HYSTEROSCOPY WITH HYDROTHERMAL ABLATION;  Surgeon: Aletha Halim, MD;  Location: [facility];  Service: Gynecology;;    OB History     Gravida  5   Para  4   Term  3   Preterm  1   AB  1   Living  4      SAB      IAB      Ectopic      Multiple      Live Births           Obstetric Comments  c--section x 4. 36wk c-section x 1  Home Medications    Prior to Admission medications   Medication Sig Start Date End Date Taking? Authorizing Provider  doxycycline (VIBRAMYCIN) 100 MG capsule Take 1 capsule (100 mg total) by mouth 2 (two) times daily for 7 days. 10/10/21 10/17/21 Yes Hazel Sams, PA-C  naloxone [facility]) nasal spray 4 mg/0.1 mL CALL 911. ADMINISTER A SINGLE SPRAY OF NARCAN IN ONE NOSTRIL. REPEAT EVERY 3 MINUTES AS NEEDED IF NO OR MINIMAL RESPONSE. 10/14/20  Yes [provider]  acetaminophen (TYLENOL) 500 MG tablet Take 2 tablets (1,000 mg total) by mouth every 6 (six) hours. 10/24/20   Ward, Honor Loh, MD  albuterol (PROVENTIL HFA;VENTOLIN HFA) 108 (90 Base) MCG/ACT inhaler Inhale 2 puffs into the lungs every 6 (six) hours as needed for wheezing or shortness  of breath.    [provider]  Ascorbic Acid (VITAMIN C PO) Take 1 tablet by mouth daily.    [provider]  bisacodyl (DULCOLAX) 10 MG suppository Place 1 suppository (10 mg total) rectally daily as needed for moderate constipation. 10/24/20   Ward, Honor Loh, MD  dicyclomine (BENTYL) 10 MG capsule Take 10 mg by mouth 3 (three) times daily as needed for spasms. 10/06/20   [provider]  diphenhydrAMINE (BENADRYL) 25 MG tablet Take 25 mg by mouth 2 (two) times daily as needed for allergies.    [provider]  docusate sodium (COLACE) 100 MG capsule Take 1 capsule (100 mg total) by mouth 2 (two) times daily. To keep stools soft Patient taking differently: Take 100 mg by mouth daily as needed. To keep stools soft 10/11/20   Benjaman Kindler, MD  fluticasone [facility]) 50 MCG/ACT nasal spray Place 1 spray into both nostrils daily as needed for allergies or rhinitis.    [provider]  gabapentin (NEURONTIN) 600 MG tablet Take 1,200 mg by mouth 2 (two) times daily. 10/08/21   [provider]  magnesium oxide (MAG-OX) 400 MG tablet Take 400 mg by mouth daily.    [provider]  Multiple Vitamins-Minerals (WOMENS DAILY FORMULA PO) Take 1 tablet by mouth daily.    [provider]  ondansetron (ZOFRAN ODT) 4 MG disintegrating tablet Take 1 tablet (4 mg total) by mouth every 8 (eight) hours as needed for nausea or vomiting. 01/14/21   Duffy Bruce, MD  pantoprazole (PROTONIX) 40 MG tablet Take 40 mg by mouth 2 (two) times daily. 10/06/20   [provider]  polyethylene glycol (MIRALAX / GLYCOLAX) packet Take 17 g by mouth daily as needed for mild constipation.     [provider]  Potassium Chloride ER 20 MEQ TBCR Take 20 mEq by mouth daily. 01/17/21   Sindy Guadeloupe, MD  promethazine (PHENERGAN) 25 MG suppository Place 1 suppository (25 mg total) rectally every 8 (eight) hours as needed for refractory nausea / vomiting.  01/14/21   Duffy Bruce, MD  sucralfate (CARAFATE) 1 g tablet Take 1 tablet (1 g total) by mouth 4 (four) times daily -  with meals and at bedtime for 7 days. 01/14/21 01/21/21  Duffy Bruce, MD  Turmeric (QC TUMERIC COMPLEX) 500 MG CAPS Take 450 mg by mouth in the morning and at bedtime. 10/24/20   Ward, Honor Loh, MD    Family History Family History  Problem Relation Age of Onset   Hypertension Mother    Colon cancer Father 2   Stroke Brother     Social History Social History   Tobacco Use   Smoking status:  Every Day    Packs/day: 0.25    Types: Cigarettes   Smokeless tobacco: Never   Tobacco comments:    1 black and mild/day  Vaping Use   Vaping Use: Every day  Substance Use Topics   Alcohol use: Not Currently   Drug use: Yes    Types: Marijuana    Comment: 10/25/2019     Allergies   Lisinopril, Aspirin, Ibuprofen, and Other   Review of Systems Review of Systems  Constitutional:  Negative for chills and fever.  HENT:  Negative for sore throat.   Eyes:  Negative for pain and redness.  Respiratory:  Negative for shortness of breath.   Cardiovascular:  Negative for chest pain.  Gastrointestinal:  Negative for abdominal pain, diarrhea, nausea and vomiting.  Genitourinary:  Negative for decreased urine volume, difficulty urinating, dysuria, flank pain, frequency, genital sores, hematuria and urgency.  Musculoskeletal:  Negative for back pain.  Skin:  Negative for rash.  All other systems reviewed and are negative.   Physical Exam Triage Vital Signs ED Triage Vitals  Enc Vitals Group     BP      Pulse      Resp      Temp      Temp src      SpO2      Weight      Height      Head Circumference      Peak Flow      Pain Score      Pain Loc      Pain Edu?      Excl. in [place]?    No data found.  Updated Vital Signs BP 109/71 (BP Location: Right Arm)    Pulse 96    Temp 99.1 F (37.3 C) (Oral)    Resp 16    LMP 07/07/2016 (Exact Date)    SpO2 97%    Visual Acuity Right Eye Distance:   Left Eye Distance:   Bilateral Distance:    Right Eye Near:   Left Eye Near:    Bilateral Near:     Physical Exam Vitals reviewed.  Constitutional:      General: She is not in acute distress.    Appearance: Normal appearance. She is not ill-appearing.  HENT:     Head: Normocephalic and atraumatic.     Mouth/Throat:     Mouth: Mucous membranes are moist.     Comments: Moist mucous membranes Eyes:     Extraocular Movements: Extraocular movements intact.     Pupils: Pupils are equal, round, and reactive to light.  Cardiovascular:     Rate and Rhythm: Normal rate and regular rhythm.     Heart sounds: Normal heart sounds.  Pulmonary:     Effort: Pulmonary effort is normal.     Breath sounds: Normal breath sounds. No wheezing, rhonchi or rales.  Abdominal:     General: Bowel sounds are normal. There is no distension.     Palpations: Abdomen is soft. There is no mass.     Tenderness: There is no abdominal tenderness. There is no right CVA tenderness, left CVA tenderness, guarding or rebound.  Genitourinary:    Comments: deferred Skin:    General: Skin is warm.     Capillary Refill: Capillary refill takes less than 2 seconds.     Comments: Good skin turgor  Neurological:     General: No focal deficit present.     Mental Status: She is alert and oriented  to person, place, and time.  Psychiatric:        Mood and Affect: Mood normal.        Behavior: Behavior normal.     UC Treatments / Results  Labs (all labs ordered are listed, but only abnormal results are displayed) Labs Reviewed  CERVICOVAGINAL ANCILLARY ONLY    EKG   Radiology No results found.  Procedures Procedures (including critical care time)  Medications Ordered in UC Medications  cefTRIAXone (ROCEPHIN) injection 500 mg (has no administration in time range)    Initial Impression / Assessment and Plan / UC Course  I have reviewed the triage vital signs and  the nursing notes.  Pertinent labs & imaging results that were available during my care of the patient were reviewed by me and considered in my medical decision making (see chart for details).     This patient is a very pleasant 46 y.o. year old female presenting with exposure to chlamydia. Afebrile, nontachycardic, no reproducible abd pain or CVAT.  U-preg negative.  Will send self-swab for G/C, trich, yeast, BV testing. Declines HIV, RPR. Safe sex precautions.   Exposure to chlamydia. Doxycycline sent. pt also prefers to treat with IM rocephin today.   Diflucan sent for yeast prophylaxis.   ED return precautions discussed. Patient verbalizes understanding and agreement.     Final Clinical Impressions(s) / UC Diagnoses   Final diagnoses:  Exposure to chlamydia  Routine screening for STI (sexually transmitted infection)     Discharge Instructions      -We treated for gonorrhea with a shot of Rocephin -For chlamydia, Doxycycline twice daily for 7 days.  Make sure to wear sunscreen while spending time outside while on this medication as it can increase your chance of sunburn. You can take this medication with food if you have a sensitive stomach. -We have sent testing for sexually transmitted infections. We will notify you of any positive results once they are received. If required, we will prescribe any medications you might need. Please refrain from all sexual activity until treatment is complete.  -Seek additional medical attention if you develop fevers/chills, new/worsening abdominal pain, new/worsening vaginal discomfort/discharge, etc.       ED Prescriptions     Medication Sig Dispense Auth. Provider   doxycycline (VIBRAMYCIN) 100 MG capsule Take 1 capsule (100 mg total) by mouth 2 (two) times daily for 7 days. 14 capsule Hazel Sams, PA-C      PDMP not reviewed this encounter.   Hazel Sams, PA-C 10/10/21 1900    Hazel Sams, PA-C 10/10/21  1904

## 2021-10-11 ENCOUNTER — Telehealth (HOSPITAL_COMMUNITY): Payer: Self-pay | Admitting: Emergency Medicine

## 2021-10-11 LAB — CERVICOVAGINAL ANCILLARY ONLY
Bacterial Vaginitis (gardnerella): POSITIVE — AB
Candida Glabrata: POSITIVE — AB
Candida Vaginitis: NEGATIVE
Chlamydia: POSITIVE — AB
Comment: NEGATIVE
Comment: NEGATIVE
Comment: NEGATIVE
Comment: NEGATIVE
Comment: NEGATIVE
Comment: NORMAL
Neisseria Gonorrhea: NEGATIVE
Trichomonas: NEGATIVE

## 2021-10-11 MED ORDER — METRONIDAZOLE 500 MG PO TABS: 500.0000 mg | ORAL_TABLET | Freq: Two times a day (BID) | ORAL | 0 refills | Status: AC

## 2021-11-23 DIAGNOSIS — Z79891 Long term (current) use of opiate analgesic: Secondary | ICD-10-CM | POA: Diagnosis not present

## 2021-12-21 DIAGNOSIS — M545 Low back pain, unspecified: Secondary | ICD-10-CM | POA: Diagnosis not present

## 2022-01-18 DIAGNOSIS — M545 Low back pain, unspecified: Secondary | ICD-10-CM | POA: Diagnosis not present

## 2022-01-23 DIAGNOSIS — I10 Essential (primary) hypertension: Secondary | ICD-10-CM | POA: Diagnosis not present

## 2022-01-23 DIAGNOSIS — Z79899 Other long term (current) drug therapy: Secondary | ICD-10-CM | POA: Diagnosis not present

## 2022-01-23 DIAGNOSIS — K219 Gastro-esophageal reflux disease without esophagitis: Secondary | ICD-10-CM | POA: Diagnosis not present

## 2022-02-14 DIAGNOSIS — M542 Cervicalgia: Secondary | ICD-10-CM | POA: Diagnosis not present

## 2022-02-14 DIAGNOSIS — G5602 Carpal tunnel syndrome, left upper limb: Secondary | ICD-10-CM | POA: Diagnosis not present

## 2022-02-14 DIAGNOSIS — Z1159 Encounter for screening for other viral diseases: Secondary | ICD-10-CM | POA: Diagnosis not present

## 2022-02-14 DIAGNOSIS — M129 Arthropathy, unspecified: Secondary | ICD-10-CM | POA: Diagnosis not present

## 2022-02-14 DIAGNOSIS — M549 Dorsalgia, unspecified: Secondary | ICD-10-CM | POA: Diagnosis not present

## 2022-02-14 DIAGNOSIS — Z79899 Other long term (current) drug therapy: Secondary | ICD-10-CM | POA: Diagnosis not present

## 2022-02-14 DIAGNOSIS — E559 Vitamin D deficiency, unspecified: Secondary | ICD-10-CM | POA: Diagnosis not present

## 2022-02-14 DIAGNOSIS — Z131 Encounter for screening for diabetes mellitus: Secondary | ICD-10-CM | POA: Diagnosis not present

## 2022-02-14 DIAGNOSIS — Z6824 Body mass index (BMI) 24.0-24.9, adult: Secondary | ICD-10-CM | POA: Diagnosis not present

## 2022-02-18 DIAGNOSIS — Z79899 Other long term (current) drug therapy: Secondary | ICD-10-CM | POA: Diagnosis not present

## 2022-03-01 DIAGNOSIS — Z79899 Other long term (current) drug therapy: Secondary | ICD-10-CM | POA: Diagnosis not present

## 2022-03-01 DIAGNOSIS — M549 Dorsalgia, unspecified: Secondary | ICD-10-CM | POA: Diagnosis not present

## 2022-03-01 DIAGNOSIS — Z6824 Body mass index (BMI) 24.0-24.9, adult: Secondary | ICD-10-CM | POA: Diagnosis not present

## 2022-03-01 DIAGNOSIS — R892 Abnormal level of other drugs, medicaments and biological substances in specimens from other organs, systems and tissues: Secondary | ICD-10-CM | POA: Diagnosis not present

## 2022-03-01 DIAGNOSIS — M542 Cervicalgia: Secondary | ICD-10-CM | POA: Diagnosis not present

## 2022-03-01 DIAGNOSIS — G5602 Carpal tunnel syndrome, left upper limb: Secondary | ICD-10-CM | POA: Diagnosis not present

## 2022-03-05 DIAGNOSIS — Z79899 Other long term (current) drug therapy: Secondary | ICD-10-CM | POA: Diagnosis not present

## 2022-03-29 DIAGNOSIS — Z79899 Other long term (current) drug therapy: Secondary | ICD-10-CM | POA: Diagnosis not present

## 2022-03-29 DIAGNOSIS — Z6823 Body mass index (BMI) 23.0-23.9, adult: Secondary | ICD-10-CM | POA: Diagnosis not present

## 2022-03-29 DIAGNOSIS — M25551 Pain in right hip: Secondary | ICD-10-CM | POA: Diagnosis not present

## 2022-03-29 DIAGNOSIS — M549 Dorsalgia, unspecified: Secondary | ICD-10-CM | POA: Diagnosis not present

## 2022-03-29 DIAGNOSIS — M542 Cervicalgia: Secondary | ICD-10-CM | POA: Diagnosis not present

## 2022-03-29 DIAGNOSIS — G5602 Carpal tunnel syndrome, left upper limb: Secondary | ICD-10-CM | POA: Diagnosis not present

## 2022-04-02 DIAGNOSIS — Z79899 Other long term (current) drug therapy: Secondary | ICD-10-CM | POA: Diagnosis not present

## 2022-04-30 DIAGNOSIS — M25551 Pain in right hip: Secondary | ICD-10-CM | POA: Diagnosis not present

## 2022-04-30 DIAGNOSIS — Z6824 Body mass index (BMI) 24.0-24.9, adult: Secondary | ICD-10-CM | POA: Diagnosis not present

## 2022-04-30 DIAGNOSIS — G5602 Carpal tunnel syndrome, left upper limb: Secondary | ICD-10-CM | POA: Diagnosis not present

## 2022-04-30 DIAGNOSIS — Z79899 Other long term (current) drug therapy: Secondary | ICD-10-CM | POA: Diagnosis not present

## 2022-04-30 DIAGNOSIS — M542 Cervicalgia: Secondary | ICD-10-CM | POA: Diagnosis not present

## 2022-04-30 DIAGNOSIS — M549 Dorsalgia, unspecified: Secondary | ICD-10-CM | POA: Diagnosis not present

## 2022-05-03 DIAGNOSIS — Z79899 Other long term (current) drug therapy: Secondary | ICD-10-CM | POA: Diagnosis not present

## 2022-05-30 DIAGNOSIS — G5602 Carpal tunnel syndrome, left upper limb: Secondary | ICD-10-CM | POA: Diagnosis not present

## 2022-05-30 DIAGNOSIS — M549 Dorsalgia, unspecified: Secondary | ICD-10-CM | POA: Diagnosis not present

## 2022-05-30 DIAGNOSIS — M25551 Pain in right hip: Secondary | ICD-10-CM | POA: Diagnosis not present

## 2022-05-30 DIAGNOSIS — E559 Vitamin D deficiency, unspecified: Secondary | ICD-10-CM | POA: Diagnosis not present

## 2022-05-30 DIAGNOSIS — M542 Cervicalgia: Secondary | ICD-10-CM | POA: Diagnosis not present

## 2022-05-30 DIAGNOSIS — Z6823 Body mass index (BMI) 23.0-23.9, adult: Secondary | ICD-10-CM | POA: Diagnosis not present

## 2022-05-30 DIAGNOSIS — Z79899 Other long term (current) drug therapy: Secondary | ICD-10-CM | POA: Diagnosis not present

## 2022-06-01 DIAGNOSIS — Z79899 Other long term (current) drug therapy: Secondary | ICD-10-CM | POA: Diagnosis not present

## 2022-06-28 DIAGNOSIS — R7303 Prediabetes: Secondary | ICD-10-CM | POA: Diagnosis not present

## 2022-06-28 DIAGNOSIS — M25551 Pain in right hip: Secondary | ICD-10-CM | POA: Diagnosis not present

## 2022-06-28 DIAGNOSIS — G5602 Carpal tunnel syndrome, left upper limb: Secondary | ICD-10-CM | POA: Diagnosis not present

## 2022-06-28 DIAGNOSIS — Z79899 Other long term (current) drug therapy: Secondary | ICD-10-CM | POA: Diagnosis not present

## 2022-06-28 DIAGNOSIS — Z6823 Body mass index (BMI) 23.0-23.9, adult: Secondary | ICD-10-CM | POA: Diagnosis not present

## 2022-06-28 DIAGNOSIS — M542 Cervicalgia: Secondary | ICD-10-CM | POA: Diagnosis not present

## 2022-07-02 DIAGNOSIS — Z79899 Other long term (current) drug therapy: Secondary | ICD-10-CM | POA: Diagnosis not present

## 2022-07-13 DIAGNOSIS — M542 Cervicalgia: Secondary | ICD-10-CM | POA: Diagnosis not present

## 2022-07-13 DIAGNOSIS — Z79899 Other long term (current) drug therapy: Secondary | ICD-10-CM | POA: Diagnosis not present

## 2022-07-13 DIAGNOSIS — G5602 Carpal tunnel syndrome, left upper limb: Secondary | ICD-10-CM | POA: Diagnosis not present

## 2022-07-13 DIAGNOSIS — M25551 Pain in right hip: Secondary | ICD-10-CM | POA: Diagnosis not present

## 2022-07-13 DIAGNOSIS — Z6824 Body mass index (BMI) 24.0-24.9, adult: Secondary | ICD-10-CM | POA: Diagnosis not present

## 2022-07-13 DIAGNOSIS — M549 Dorsalgia, unspecified: Secondary | ICD-10-CM | POA: Diagnosis not present

## 2022-07-17 DIAGNOSIS — Z79899 Other long term (current) drug therapy: Secondary | ICD-10-CM | POA: Diagnosis not present

## 2022-07-27 DIAGNOSIS — M542 Cervicalgia: Secondary | ICD-10-CM | POA: Diagnosis not present

## 2022-07-27 DIAGNOSIS — Z79899 Other long term (current) drug therapy: Secondary | ICD-10-CM | POA: Diagnosis not present

## 2022-07-27 DIAGNOSIS — Z6823 Body mass index (BMI) 23.0-23.9, adult: Secondary | ICD-10-CM | POA: Diagnosis not present

## 2022-07-27 DIAGNOSIS — G5602 Carpal tunnel syndrome, left upper limb: Secondary | ICD-10-CM | POA: Diagnosis not present

## 2022-07-27 DIAGNOSIS — M25551 Pain in right hip: Secondary | ICD-10-CM | POA: Diagnosis not present

## 2022-07-31 DIAGNOSIS — Z79899 Other long term (current) drug therapy: Secondary | ICD-10-CM | POA: Diagnosis not present

## 2022-08-23 DIAGNOSIS — Z79899 Other long term (current) drug therapy: Secondary | ICD-10-CM | POA: Diagnosis not present

## 2022-08-28 DIAGNOSIS — Z79899 Other long term (current) drug therapy: Secondary | ICD-10-CM | POA: Diagnosis not present

## 2022-09-06 ENCOUNTER — Emergency Department
Admission: EM | Admit: 2022-09-06 | Discharge: 2022-09-06 | Disposition: A | Discharge status: Home / Self Care | Payer: BC Managed Care – PPO | Attending: Emergency Medicine | Admitting: Emergency Medicine

## 2022-09-06 ENCOUNTER — Other Ambulatory Visit: Payer: Self-pay

## 2022-09-06 ENCOUNTER — Emergency Department: Payer: BC Managed Care – PPO

## 2022-09-06 ENCOUNTER — Ambulatory Visit
Admission: RE | Admit: 2022-09-06 | Discharge: 2022-09-06 | Disposition: A | Discharge status: Home / Self Care | Payer: BC Managed Care – PPO | Source: Ambulatory Visit

## 2022-09-06 VITALS — BP 122/77 | HR 112 | Temp 97.8°F | Resp 17

## 2022-09-06 DIAGNOSIS — D649 Anemia, unspecified: Secondary | ICD-10-CM | POA: Diagnosis not present

## 2022-09-06 DIAGNOSIS — K257 Chronic gastric ulcer without hemorrhage or perforation: Secondary | ICD-10-CM

## 2022-09-06 DIAGNOSIS — K2901 Acute gastritis with bleeding: Secondary | ICD-10-CM | POA: Diagnosis not present

## 2022-09-06 DIAGNOSIS — R1011 Right upper quadrant pain: Secondary | ICD-10-CM | POA: Diagnosis not present

## 2022-09-06 DIAGNOSIS — R1010 Upper abdominal pain, unspecified: Secondary | ICD-10-CM | POA: Diagnosis not present

## 2022-09-06 DIAGNOSIS — K2900 Acute gastritis without bleeding: Secondary | ICD-10-CM | POA: Diagnosis not present

## 2022-09-06 DIAGNOSIS — R110 Nausea: Secondary | ICD-10-CM

## 2022-09-06 LAB — COMPREHENSIVE METABOLIC PANEL
ALT: 9 U/L (ref 0–44)
AST: 15 U/L (ref 15–41)
Albumin: 3.9 g/dL (ref 3.5–5.0)
Alkaline Phosphatase: 55 U/L (ref 38–126)
Anion gap: 9 (ref 5–15)
BUN: 12 mg/dL (ref 6–20)
CO2: 25 mmol/L (ref 22–32)
Calcium: 9.2 mg/dL (ref 8.9–10.3)
Chloride: 104 mmol/L (ref 98–111)
Creatinine, Ser: 0.5 mg/dL (ref 0.44–1.00)
GFR, Estimated: >60 mL/min (ref >60)
Glucose, Bld: 96 mg/dL (ref 70–99)
Potassium: 3 mmol/L — ABNORMAL LOW (ref 3.5–5.1)
Sodium: 138 mmol/L (ref 135–145)
Total Bilirubin: 0.3 mg/dL (ref 0.3–1.2)
Total Protein: 7.1 g/dL (ref 6.5–8.1)

## 2022-09-06 LAB — CBC
HCT: 31.8 % — ABNORMAL LOW (ref 36.0–46.0)
Hemoglobin: 9.6 g/dL — ABNORMAL LOW (ref 12.0–15.0)
MCH: 26 pg (ref 26.0–34.0)
MCHC: 30.2 g/dL (ref 30.0–36.0)
MCV: 86.2 fL (ref 80.0–100.0)
Platelets: 277 10*3/uL (ref 150–400)
RBC: 3.69 MIL/uL — ABNORMAL LOW (ref 3.87–5.11)
RDW: 18.6 % — ABNORMAL HIGH (ref 11.5–15.5)
WBC: 5.5 10*3/uL (ref 4.0–10.5)
nRBC: 0 % (ref 0.0–0.2)

## 2022-09-06 LAB — LIPASE, BLOOD: Lipase: 34 U/L (ref 11–51)

## 2022-09-06 MED ORDER — ONDANSETRON 4 MG PO TBDP: 4.0000 mg | ORAL_TABLET | Freq: Three times a day (TID) | ORAL | 0 refills | Status: AC | PRN

## 2022-09-06 MED ORDER — OMEPRAZOLE 40 MG PO CPDR: 40.0000 mg | DELAYED_RELEASE_CAPSULE | Freq: Two times a day (BID) | ORAL | 2 refills | Status: DC

## 2022-09-06 MED ORDER — FAMOTIDINE 20 MG PO TABS
20.0000 mg | ORAL_TABLET | Freq: Once | ORAL | Status: AC
  Administered 2022-09-06: 20 mg via ORAL
  Filled 2022-09-06: qty 1

## 2022-09-06 MED ORDER — ONDANSETRON 4 MG PO TBDP
4.0000 mg | ORAL_TABLET | Freq: Once | ORAL | Status: AC
  Administered 2022-09-06: 4 mg via ORAL
  Filled 2022-09-06: qty 1

## 2022-09-06 MED ORDER — POTASSIUM CHLORIDE CRYS ER 20 MEQ PO TBCR
40.0000 meq | EXTENDED_RELEASE_TABLET | Freq: Once | ORAL | Status: AC
  Administered 2022-09-06: 40 meq via ORAL
  Filled 2022-09-06: qty 2

## 2022-09-06 MED ORDER — SUCRALFATE 1 G PO TABS: 1.0000 g | ORAL_TABLET | Freq: Three times a day (TID) | ORAL | 0 refills | Status: DC

## 2022-09-06 MED ORDER — POTASSIUM CHLORIDE CRYS ER 20 MEQ PO TBCR: 20.0000 meq | EXTENDED_RELEASE_TABLET | Freq: Every day | ORAL | 0 refills | Status: AC

## 2022-09-06 MED ORDER — ONDANSETRON 4 MG PO TBDP: 4.0000 mg | ORAL_TABLET | Freq: Three times a day (TID) | ORAL | 0 refills | Status: DC | PRN

## 2022-09-06 MED ORDER — OMEPRAZOLE 40 MG PO CPDR: 40.0000 mg | DELAYED_RELEASE_CAPSULE | Freq: Two times a day (BID) | ORAL | 0 refills | Status: DC

## 2022-09-06 NOTE — ED Triage Notes (Signed)
Presents to UC w/ c/o abdominal pain and emesis for the past 2 weeks.

## 2022-09-06 NOTE — ED Triage Notes (Signed)
Pt reports has had some general abd pain for 2 weeks. Pt states has hx of a stomach ulcer and went to her MD today and was advised to come to the ED to have it checked out. Pt reports some NV as well.

## 2022-09-06 NOTE — ED Notes (Signed)
D/C and reasons to return discussed with pt, pt verbalized understanding. NAD noted. Pt refuses D/C vitals. Pt ambulatory with steady gait on D/C.

## 2022-09-06 NOTE — ED Provider Notes (Addendum)
[facility]    CSN: [id number] Arrival date & time: 09/06/22  1307      History   Chief Complaint Chief Complaint  Patient presents with   Abdominal Pain   Emesis    HPI Shirley [name] is a 46 y.o. female.    Abdominal Pain Associated symptoms: vomiting   Emesis Associated symptoms: abdominal pain     Presents to urgent care with complaint of abdominal pain and vomiting x 2 weeks.  She endorses history of stomach ulcers with occasional flares.  She states her last flare was in November and was evaluated by her primary care but flare lasted only 3 days.  She endorses past endoscopy with diagnosis of ulcer.  She is not aware of the cause of her ulcer including whether H. pylori is present.  2020 records indicate biopsies negative for H. pylori.  Past Medical History:  Diagnosis Date   Anemia    Asthma    Bacteremia    Blood dyscrasia    has to apply a lot of pressure to stop bleeding   GERD (gastroesophageal reflux disease)    Heart murmur    with pregnancy   History of hiatal hernia    Hypertension    4-5 years ago, no meds   Kidney infection    patient states I might have a kidney infection   Migraine    migraines 1-2x/month   Pyelonephritis    Seizure (HCC) 05/09/2017   due to low potassium   Wears dentures    partial upper and lower    Patient Active Problem List   Diagnosis Date Noted   Endometriosis 10/24/2020   Acute blood loss anemia 10/24/2020   Hypoxia 10/24/2020   Acute pelvic pain 10/18/2020   Iron deficiency anemia 08/10/2019   Acute peptic ulcer of stomach    Dysphagia, idiopathic    Nausea    Abnormal CT scan, colon    Pyelonephritis 08/03/2017   Bacteremia 08/03/2017   Hypokalemia 08/03/2017   Tobacco use 08/03/2017   Acute postoperative pain 07/31/2016   Menometrorrhagia 04/09/2016   Anemia 04/09/2016   Abnormal uterine bleeding (AUB) 04/09/2016   Essential hypertension 11/17/2009    Past Surgical History:   Procedure Laterality Date   ABDOMINAL HERNIA REPAIR     CESAREAN SECTION     x 4   CESAREAN SECTION     x 4   COLONOSCOPY WITH PROPOFOL N/A 07/24/2019   Procedure: COLONOSCOPY WITH PROPOFOL;  Surgeon: Pasty Spillersahiliani, Varnita B, MD;  [facility];  Service: Endoscopy;  Laterality: N/A;   DILATATION & CURETTAGE/HYSTEROSCOPY WITH MYOSURE N/A 10/11/2020   Procedure: DILATATION & CURETTAGE/HYSTEROSCOPY WITH MYOSURE LYSIS OF ADHESIONS;  Surgeon: Christeen DouglasBeasley, Bethany, MD;  Location: [facility];  Service: Gynecology;  Laterality: N/A;   ENDOMETRIAL ABLATION  07/31/2016   ESOPHAGOGASTRODUODENOSCOPY (EGD) WITH PROPOFOL N/A 07/24/2019   Procedure: ESOPHAGOGASTRODUODENOSCOPY (EGD) WITH PROPOFOL;  Surgeon: Pasty Spillersahiliani, Varnita B, MD;  [facility];  Service: Endoscopy;  Laterality: N/A;   HYSTERECTOMY ABDOMINAL WITH SALPINGECTOMY Bilateral 10/18/2020   Procedure: HYSTERECTOMY ABDOMINAL WITH SALPINGECTOMY;  Surgeon: Christeen DouglasBeasley, Bethany, MD;  Location: [facility];  Service: Gynecology;  Laterality: Bilateral;   HYSTEROSCOPY  07/31/2016   Procedure: HYSTEROSCOPY WITH HYDROTHERMAL ABLATION;  Surgeon: [place] Bingharlie Pickens, MD;  Location: [facility];  Service: Gynecology;;    OB History     Gravida  5   Para  4   Term  3   Preterm  1   AB  1   Living  4      SAB      IAB      Ectopic      Multiple      Live Births           Obstetric Comments  c--section x 4. 36wk c-section x 1          Home Medications    Prior to Admission medications   Medication Sig Start Date End Date Taking? Authorizing Provider  cyclobenzaprine (FEXMID) 7.5 MG tablet Take 7.5 mg by mouth daily as needed. 08/24/22  Yes [provider]  NIFEdipine (ADALAT CC) 30 MG 24 hr tablet Take 1 tablet by mouth daily. 10/25/20  Yes [provider]  oxyCODONE (OXY IR/ROXICODONE) 5 MG immediate release tablet Take by mouth. 01/31/21  Yes [provider]  oxyCODONE-acetaminophen  (PERCOCET) 10-325 MG tablet Take 1 tablet by mouth 5 (five) times daily as needed. 08/24/22  Yes [provider]  Vitamin D, Ergocalciferol, (DRISDOL) 1.25 MG (50000 UNIT) CAPS capsule Take 50,000 Units by mouth once a week. 07/23/22  Yes [provider]  acetaminophen (TYLENOL) 500 MG tablet Take 2 tablets (1,000 mg total) by mouth every 6 (six) hours. 10/24/20   Ward, Elenora Fender, MD  albuterol (PROVENTIL HFA;VENTOLIN HFA) 108 (90 Base) MCG/ACT inhaler Inhale 2 puffs into the lungs every 6 (six) hours as needed for wheezing or shortness of breath.    [provider]  Ascorbic Acid (VITAMIN C PO) Take 1 tablet by mouth daily.    [provider]  bisacodyl (DULCOLAX) 10 MG suppository Place 1 suppository (10 mg total) rectally daily as needed for moderate constipation. 10/24/20   Ward, Elenora Fender, MD  dicyclomine (BENTYL) 10 MG capsule Take 10 mg by mouth 3 (three) times daily as needed for spasms. 10/06/20   [provider]  diphenhydrAMINE (BENADRYL) 25 MG tablet Take 25 mg by mouth 2 (two) times daily as needed for allergies.    [provider]  docusate sodium (COLACE) 100 MG capsule Take 1 capsule (100 mg total) by mouth 2 (two) times daily. To keep stools soft Patient taking differently: Take 100 mg by mouth daily as needed. To keep stools soft 10/11/20   Christeen Douglas, MD  fluconazole (DIFLUCAN) 150 MG tablet Take 1 tablet (150 mg total) by mouth daily. -For your yeast infection, start the Diflucan (fluconazole)- Take one pill today (day 1). If you're still having symptoms in 3 days, take the second pill. 10/10/21   Rhys Martini, PA-C  fluticasone (FLONASE) 50 MCG/ACT nasal spray Place 1 spray into both nostrils daily as needed for allergies or rhinitis.    [provider]  gabapentin (NEURONTIN) 600 MG tablet Take 1,200 mg by mouth 2 (two) times daily. 10/08/21   [provider]  magnesium oxide (MAG-OX) 400 MG tablet Take 400  mg by mouth daily.    [provider]  metroNIDAZOLE (FLAGYL) 500 MG tablet Take 1 tablet (500 mg total) by mouth 2 (two) times daily. 10/11/21   LampteyBritta Mccreedy, MD  Multiple Vitamins-Minerals (WOMENS DAILY FORMULA PO) Take 1 tablet by mouth daily.    [provider]  naloxone [facility]) nasal spray 4 mg/0.1 mL CALL 911. ADMINISTER A SINGLE SPRAY OF NARCAN IN ONE NOSTRIL. REPEAT EVERY 3 MINUTES AS NEEDED IF NO OR MINIMAL RESPONSE. 10/14/20   [provider]  ondansetron (ZOFRAN ODT) 4 MG disintegrating tablet Take 1 tablet (4 mg total)  by mouth every 8 (eight) hours as needed for nausea or vomiting. 01/14/21   Shaune Pollack, MD  pantoprazole (PROTONIX) 40 MG tablet Take 40 mg by mouth 2 (two) times daily. 10/06/20   [provider]  polyethylene glycol (MIRALAX / GLYCOLAX) packet Take 17 g by mouth daily as needed for mild constipation.     [provider]  Potassium Chloride ER 20 MEQ TBCR Take 20 mEq by mouth daily. 01/17/21   Creig Hines, MD  promethazine (PHENERGAN) 25 MG suppository Place 1 suppository (25 mg total) rectally every 8 (eight) hours as needed for refractory nausea / vomiting. 01/14/21   Shaune Pollack, MD  sucralfate (CARAFATE) 1 g tablet Take 1 tablet (1 g total) by mouth 4 (four) times daily -  with meals and at bedtime for 7 days. 01/14/21 01/21/21  Shaune Pollack, MD  Turmeric (QC TUMERIC COMPLEX) 500 MG CAPS Take 450 mg by mouth in the morning and at bedtime. 10/24/20   Ward, Elenora Fender, MD    Family History Family History  Problem Relation Age of Onset   Hypertension Mother    Colon cancer Father 53   Stroke Brother     Social History Social History   Tobacco Use   Smoking status: Every Day    Packs/day: 0.25    Types: Cigarettes   Smokeless tobacco: Never   Tobacco comments:    1 black and mild/day  Vaping Use   Vaping Use: Every day  Substance Use Topics   Alcohol use: Not Currently   Drug use: Yes    Types:  Marijuana    Comment: 10/25/2019     Allergies   Lisinopril, Aspirin, Ibuprofen, and Other   Review of Systems Review of Systems  Gastrointestinal:  Positive for abdominal pain and vomiting.     Physical Exam Triage Vital Signs ED Triage Vitals [09/06/22 1317]  Enc Vitals Group     BP 122/77     Pulse Rate (!) 112     Resp 17     Temp 97.8 F (36.6 C)     Temp src      SpO2 96 %     Weight      Height      Head Circumference      Peak Flow      Pain Score 9     Pain Loc      Pain Edu?      Excl. in GC?    No data found.  Updated Vital Signs BP 122/77   Pulse (!) 112   Temp 97.8 F (36.6 C)   Resp 17   LMP 04/25/2016 (Exact Date)   SpO2 96%   Visual Acuity Right Eye Distance:   Left Eye Distance:   Bilateral Distance:    Right Eye Near:   Left Eye Near:    Bilateral Near:     Physical Exam Vitals reviewed.  Constitutional:      Appearance: She is well-developed.  Abdominal:     General: Abdomen is flat.     Palpations: Abdomen is soft.     Tenderness: There is abdominal tenderness in the right upper quadrant, epigastric area and left upper quadrant. There is guarding.  Skin:    General: Skin is warm and dry.  Neurological:     General: No focal deficit present.     Mental Status: She is alert and oriented to person, place, and time.  Psychiatric:  Mood and Affect: Mood normal.        Behavior: Behavior normal.      UC Treatments / Results  Labs (all labs ordered are listed, but only abnormal results are displayed) Labs Reviewed - No data to display  EKG   Radiology No results found.  Procedures Procedures (including critical care time)  Medications Ordered in UC Medications - No data to display  Initial Impression / Assessment and Plan / UC Course  I have reviewed the triage vital signs and the nursing notes.  Pertinent labs & imaging results that were available during my care of the patient were reviewed by me and  considered in my medical decision making (see chart for details).   Patient is afebrile here without recent antipyretics. Satting well on room air. Overall is well appearing, well hydrated, without respiratory distress.   She has exquisite upper abdomen tenderness with just light palpation: left, epigastric and right with guarding.  Her history and office visit records indicate past gastric ulcer and will treat for that today however cautioned patient that we were unable to do any abdominal imaging which I thought might be helpful to rule out other concerns.  Given the diffuse nature of her pain, I am concerned with possible hemorrhage.  I urged the patient to either go to the ED or at least follow-up urgently with her primary care provider to request outpatient evaluation.    Will prescribe sucralfate for the ulcer, ondansetron for nausea, as well as a PPI.    Final Clinical Impressions(s) / UC Diagnoses   Final diagnoses:  None   Discharge Instructions   None    ED Prescriptions   None    PDMP not reviewed this encounter.   Charma Igo, FNP 09/06/22 1344    ImmordinoJeannett Senior, [place] 09/06/22 1346

## 2022-09-06 NOTE — ED Provider Notes (Signed)
[facility] Provider Note    Event Date/Time   First MD Initiated Contact with Patient 09/06/22 1657     (approximate)   History   Abdominal Pain   HPI  Shirley [name] is a 46 y.o. female past medical history significant for gastritis, presents to the emergency department with upper abdominal pain and nausea.  Endorses 2 weeks of upper abdominal burning sensation.  States that she was evaluated at urgent care and told to come to the emergency department.  Endorses burning abdominal pain associate with nausea and vomiting.  Does endorse black stool.  Not on anticoagulation.  History of gastric ulcer in the past and her last endoscopy was 3 years ago.  She is currently taking omeprazole 20 mg twice daily and Carafate 4 times daily.  Denies any alcohol use.  Endorses marijuana use a couple of days ago but denies any daily marijuana use.  States that she thought it would improve her nausea and vomiting.     Physical Exam   Triage Vital Signs: ED Triage Vitals  Enc Vitals Group     BP 09/06/22 1448 (!) 118/91     Pulse Rate 09/06/22 1448 97     Resp 09/06/22 1448 20     Temp 09/06/22 1448 98.7 F (37.1 C)     Temp Source 09/06/22 1448 Oral     SpO2 09/06/22 1448 100 %     Weight 09/06/22 1447 119 lb (54 kg)     Height 09/06/22 1447 5\' 2"  (1.575 m)     Head Circumference --      Peak Flow --      Pain Score 09/06/22 1447 9     Pain Loc --      Pain Edu? --      Excl. in GC? --     Most recent vital signs: Vitals:   09/06/22 1448  BP: (!) 118/91  Pulse: 97  Resp: 20  Temp: 98.7 F (37.1 C)  SpO2: 100%    Physical Exam Constitutional:      Appearance: She is well-developed.  HENT:     Head: Atraumatic.  Eyes:     Conjunctiva/sclera: Conjunctivae normal.  Cardiovascular:     Rate and Rhythm: Regular rhythm.  Pulmonary:     Effort: No respiratory distress.  Abdominal:     General: There is no distension.     Tenderness: There is  abdominal tenderness in the right upper quadrant and epigastric area. Negative signs include Murphy's sign.  Musculoskeletal:        General: Normal range of motion.     Cervical back: Normal range of motion.  Skin:    General: Skin is warm.  Neurological:     Mental Status: She is alert. Mental status is at baseline.     IMPRESSION / MDM / ASSESSMENT AND PLAN / ED COURSE  I reviewed the triage vital signs and the nursing notes.  Differential diagnosis including pancreatitis, gastritis, peptic ulcer disease, symptomatic cholelithiasis, acute cholecystitis   RADIOLOGY I independently reviewed imaging, my interpretation of imaging: Ultrasound of the gallbladder without signs of gallstones.  Read as no acute findings.  LABS (all labs ordered are listed, but only abnormal results are displayed) Labs interpreted as -   Anemia with a hemoglobin of 9.6 which is down trended from her previous.  Mildly low potassium at 3.0.  Normal BUN.  Creatinine appears to be at her baseline.  Lipase within normal limits.  Labs Reviewed  COMPREHENSIVE METABOLIC PANEL - Abnormal; Notable for the following components:      Result Value   Potassium 3.0 (*)    All other components within normal limits  CBC - Abnormal; Notable for the following components:   RBC 3.69 (*)    Hemoglobin 9.6 (*)    HCT 31.8 (*)    RDW 18.6 (*)    All other components within normal limits  LIPASE, BLOOD  POC URINE PREG, ED    TREATMENT   Patient without symptoms of anemia.  Patient was given Pepcid, potassium and antiemetics.  Patient able to tolerate p.o.  Discussed no marijuana use with the patient.  Discussed the need to call primary care physician first thing tomorrow morning and her gastroenterologist to get back in more likely need of endoscopy.  Will increase the dose of her omeprazole to 40 mg twice a day.  Encouraged to continue on Carafate.  Patient stated that if she did not have improvement of her symptoms  or she is not able to get back in with her gastroenterologist/primary care physician that she would return to the emergency department.   PROCEDURES:  Critical Care performed: No  Procedures  Patient's presentation is most consistent with acute presentation with potential threat to life or bodily function.   MEDICATIONS ORDERED IN ED: Medications  famotidine (PEPCID) tablet 20 mg (20 mg Oral Given 09/06/22 1750)  ondansetron (ZOFRAN-ODT) disintegrating tablet 4 mg (4 mg Oral Given 09/06/22 1750)  potassium chloride SA (KLOR-CON M) CR tablet 40 mEq (40 mEq Oral Given 09/06/22 1750)    FINAL CLINICAL IMPRESSION(S) / ED DIAGNOSES   Final diagnoses:  Acute gastritis with hemorrhage, unspecified gastritis type  Anemia, unspecified type     Rx / DC Orders   ED Discharge Orders          Ordered    omeprazole (PRILOSEC) 40 MG capsule  2 times daily        09/06/22 1805    ondansetron (ZOFRAN-ODT) 4 MG disintegrating tablet  Every 8 hours PRN        09/06/22 1805    potassium chloride SA (KLOR-CON M) 20 MEQ tablet  Daily        09/06/22 1805             Note:  This document was prepared using Dragon voice recognition software and may include unintentional dictation errors.   Corena Herter, MD 09/06/22 [phone]

## 2022-09-06 NOTE — Discharge Instructions (Signed)
Please follow-up urgently with your primary care provider or go to the ED if today's treatment does not provide relief of your symptoms.

## 2022-09-06 NOTE — Discharge Instructions (Signed)
You are seen in the emergency department for upper abdominal pain.  You an ultrasound done of your gallbladder that did not show any signs of gallstones.  Your lab work showed signs of anemia.  Concerned that you are bleeding from your gastritis.  It was not to the level that you needed a blood transfusion.  If you develop symptoms of anemia (shortness of breath, lightheadedness, weakness) and is importantly return to the emergency department.  Call your primary care physician and your gastroenterologist first thing in the morning to schedule close follow-up.  Increase your omeprazole dose from 20 mg twice a day to 40 mg twice a day.  Continue to take your Carafate.  You are given nausea medication.  Do not smoke any marijuana.  Your potassium level was low, start taking potassium tablets for the next 3 days.  Eat foods high in potassium and magnesium.  Your potassium was mildly low when checked today.  Make sure to follow up with a primary doctor to follow up your labs.  Make sure to eat food high in potassium and magnesium - examples - potatoes, spinach, bananas, beans, avocadoes, oranges, nuts.

## 2023-10-20 ENCOUNTER — Encounter: Payer: Self-pay | Admitting: Oncology

## 2023-10-20 ENCOUNTER — Emergency Department: Payer: Commercial Managed Care - PPO

## 2023-10-20 ENCOUNTER — Emergency Department
Admission: EM | Admit: 2023-10-20 | Discharge: 2023-10-20 | Disposition: A | Discharge status: Home / Self Care | Payer: Self-pay | Attending: Emergency Medicine | Admitting: Emergency Medicine

## 2023-10-20 ENCOUNTER — Other Ambulatory Visit: Payer: Self-pay

## 2023-10-20 DIAGNOSIS — I10 Essential (primary) hypertension: Secondary | ICD-10-CM | POA: Insufficient documentation

## 2023-10-20 DIAGNOSIS — R1011 Right upper quadrant pain: Secondary | ICD-10-CM | POA: Insufficient documentation

## 2023-10-20 DIAGNOSIS — R109 Unspecified abdominal pain: Secondary | ICD-10-CM

## 2023-10-20 DIAGNOSIS — R1031 Right lower quadrant pain: Secondary | ICD-10-CM | POA: Insufficient documentation

## 2023-10-20 LAB — URINALYSIS, ROUTINE W REFLEX MICROSCOPIC
Bilirubin Urine: NEGATIVE
Glucose, UA: NEGATIVE mg/dL
Ketones, ur: NEGATIVE mg/dL
Leukocytes,Ua: NEGATIVE
Nitrite: NEGATIVE
Protein, ur: NEGATIVE mg/dL
Specific Gravity, Urine: 1.006 (ref 1.005–1.030)
pH: 7 (ref 5.0–8.0)

## 2023-10-20 LAB — COMPREHENSIVE METABOLIC PANEL
ALT: 8 U/L (ref 0–44)
AST: 16 U/L (ref 15–41)
Albumin: 4 g/dL (ref 3.5–5.0)
Alkaline Phosphatase: 55 U/L (ref 38–126)
Anion gap: 11 (ref 5–15)
BUN: 7 mg/dL (ref 6–20)
CO2: 25 mmol/L (ref 22–32)
Calcium: 9.3 mg/dL (ref 8.9–10.3)
Chloride: 101 mmol/L (ref 98–111)
Creatinine, Ser: 0.64 mg/dL (ref 0.44–1.00)
GFR, Estimated: >60 mL/min (ref >60)
Glucose, Bld: 73 mg/dL (ref 70–99)
Potassium: 3.2 mmol/L — ABNORMAL LOW (ref 3.5–5.1)
Sodium: 137 mmol/L (ref 135–145)
Total Bilirubin: 0.5 mg/dL (ref 0.0–1.2)
Total Protein: 7 g/dL (ref 6.5–8.1)

## 2023-10-20 LAB — CBC WITH DIFFERENTIAL/PLATELET
Abs Immature Granulocytes: 0.02 10*3/uL (ref 0.00–0.07)
Basophils Absolute: 0 10*3/uL (ref 0.0–0.1)
Basophils Relative: 0 %
Eosinophils Absolute: 0.2 10*3/uL (ref 0.0–0.5)
Eosinophils Relative: 3 %
HCT: 29.6 % — ABNORMAL LOW (ref 36.0–46.0)
Hemoglobin: 9.2 g/dL — ABNORMAL LOW (ref 12.0–15.0)
Immature Granulocytes: 0 %
Lymphocytes Relative: 35 %
Lymphs Abs: 2 10*3/uL (ref 0.7–4.0)
MCH: 25.8 pg — ABNORMAL LOW (ref 26.0–34.0)
MCHC: 31.1 g/dL (ref 30.0–36.0)
MCV: 82.9 fL (ref 80.0–100.0)
Monocytes Absolute: 0.3 10*3/uL (ref 0.1–1.0)
Monocytes Relative: 6 %
Neutro Abs: 3.1 10*3/uL (ref 1.7–7.7)
Neutrophils Relative %: 56 %
Platelets: 306 10*3/uL (ref 150–400)
RBC: 3.57 MIL/uL — ABNORMAL LOW (ref 3.87–5.11)
RDW: 19 % — ABNORMAL HIGH (ref 11.5–15.5)
WBC: 5.7 10*3/uL (ref 4.0–10.5)
nRBC: 0 % (ref 0.0–0.2)

## 2023-10-20 LAB — LIPASE, BLOOD: Lipase: 21 U/L (ref 11–51)

## 2023-10-20 MED ORDER — ONDANSETRON HCL 4 MG/2ML IJ SOLN
4.0000 mg | Freq: Once | INTRAMUSCULAR | Status: AC
  Administered 2023-10-20: 4 mg via INTRAVENOUS
  Filled 2023-10-20: qty 2

## 2023-10-20 MED ORDER — MORPHINE SULFATE (PF) 4 MG/ML IV SOLN
4.0000 mg | Freq: Once | INTRAVENOUS | Status: AC
  Administered 2023-10-20: 4 mg via INTRAVENOUS
  Filled 2023-10-20: qty 1

## 2023-10-20 MED ORDER — CYCLOBENZAPRINE HCL 10 MG PO TABS: 10.0000 mg | ORAL_TABLET | Freq: Three times a day (TID) | ORAL | 0 refills | Status: AC | PRN

## 2023-10-20 MED ORDER — IOHEXOL 300 MG/ML  SOLN
100.0000 mL | Freq: Once | INTRAMUSCULAR | Status: AC | PRN
  Administered 2023-10-20: 100 mL via INTRAVENOUS

## 2023-10-20 MED ORDER — CYCLOBENZAPRINE HCL 10 MG PO TABS
10.0000 mg | ORAL_TABLET | Freq: Once | ORAL | Status: AC
  Administered 2023-10-20: 10 mg via ORAL
  Filled 2023-10-20: qty 1

## 2023-10-20 NOTE — ED Triage Notes (Signed)
 Pt to ED for R sided pain since Wednesday, worse with movement, especially when reaching  for things. Denies known injury.   Denies urinary symptoms. Denies back pain. Pt states pain is just to "side" and is worse with pressure.

## 2023-10-20 NOTE — ED Notes (Addendum)
 Pt is complaining of abdominal pain and pressure to urinate. Pt ambulated to toilet to provide urine specimen. Pt is guarding.

## 2023-10-20 NOTE — ED Provider Notes (Signed)
 [facility] Provider Note    Event Date/Time   First MD Initiated Contact with Patient 10/20/23 1628     (approximate)   History   Muscle Pain   HPI  Shirley [name] is a 48 y.o. female history of hypertension, pyelonephritis, seizures, blood dyscrasia and chronic pain presents emergency department with right sided abdominal pain.  Symptoms for about 4 days.  States hurts more with movement.  States no injury.  Does not hurt on the left side.  Patient still has gallbladder.  Does have some nausea associated with it.  Also still has her appendix.      Physical Exam   Triage Vital Signs: ED Triage Vitals  Encounter Vitals Group     BP 10/20/23 1537 105/77     Systolic BP Percentile --      Diastolic BP Percentile --      Pulse Rate 10/20/23 1537 74     Resp 10/20/23 1537 16     Temp 10/20/23 1537 98.7 F (37.1 C)     Temp Source 10/20/23 1537 Oral     SpO2 10/20/23 1537 97 %     Weight 10/20/23 1538 130 lb (59 kg)     Height 10/20/23 1538 5' 2 (1.575 m)     Head Circumference --      Peak Flow --      Pain Score 10/20/23 1535 9     Pain Loc --      Pain Education --      Exclude from Growth Chart --     Most recent vital signs: Vitals:   10/20/23 1537 10/20/23 1949  BP: 105/77 118/75  Pulse: 74 66  Resp: 16 18  Temp: 98.7 F (37.1 C) 98 F (36.7 C)  SpO2: 97% 100%     General: Awake, no distress.   CV:  Good peripheral perfusion. regular rate and  rhythm Resp:  Normal effort. Lungs cta Abd:  No distention.  Tender in the right upper quadrant, tender right lower quadrant, left upper and lower quadrants are nontender, bowel sounds present all 4 quads Other:      ED Results / Procedures / Treatments   Labs (all labs ordered are listed, but only abnormal results are displayed) Labs Reviewed  COMPREHENSIVE METABOLIC PANEL - Abnormal; Notable for the following components:      Result Value   Potassium 3.2 (*)    All  other components within normal limits  CBC WITH DIFFERENTIAL/PLATELET - Abnormal; Notable for the following components:   RBC 3.57 (*)    Hemoglobin 9.2 (*)    HCT 29.6 (*)    MCH 25.8 (*)    RDW 19.0 (*)    All other components within normal limits  URINALYSIS, ROUTINE W REFLEX MICROSCOPIC - Abnormal; Notable for the following components:   Color, Urine YELLOW (*)    APPearance CLOUDY (*)    Hgb urine dipstick SMALL (*)    Bacteria, UA RARE (*)    All other components within normal limits  LIPASE, BLOOD     EKG     RADIOLOGY CT abdomen pelvis IV contrast    PROCEDURES:   Procedures Chief Complaint  Patient presents with   Muscle Pain      MEDICATIONS ORDERED IN ED: Medications  ondansetron  (ZOFRAN ) injection 4 mg (4 mg Intravenous Given 10/20/23 1722)  morphine  (PF) 4 MG/ML injection 4 mg (4 mg Intravenous Given 10/20/23 1722)  iohexol  (OMNIPAQUE ) 300 MG/ML  solution 100 mL (100 mLs Intravenous Contrast Given 10/20/23 1812)  cyclobenzaprine  (FLEXERIL ) tablet 10 mg (10 mg Oral Given 10/20/23 1948)     IMPRESSION / MDM / ASSESSMENT AND PLAN / ED COURSE  I reviewed the triage vital signs and the nursing notes.                              Differential diagnosis includes, but is not limited to, acute cholecystitis, bowel obstruction, colitis, kidney stone, pyelonephritis, acute appendicitis, abdominal pain, muscle strain  Patient's presentation is most consistent with acute illness / injury with system symptoms.   Labs, patient was given morphine  4 mg IV, Zofran  4 mg IV  CT abdomen pelvis IV contrast ordered due to concerns of acute cholecystitis and acute appendicitis along with bowel obstruction   CT abdomen pelvis with IV contrast independently reviewed interpreted by me as being negative for any acute abnormality  I did explain these findings to the patient.  Her labs are reassuring.  She is to follow-up with her regular doctor.  She did have some relief with  morphine .  Her PDMP shows that she has chronic narcotic use as send on a pain contract.  Will give her Flexeril .  She is to follow-up with her doctor.  Return if worsening.  She is in agreement treatment plan.  Discharged stable condition.   FINAL CLINICAL IMPRESSION(S) / ED DIAGNOSES   Final diagnoses:  Acute flank pain     Rx / DC Orders   ED Discharge Orders          Ordered    cyclobenzaprine  (FLEXERIL ) 10 MG tablet  3 times daily PRN        10/20/23 1918             Note:  This document was prepared using Dragon voice recognition software and may include unintentional dictation errors.    Gasper Devere ORN, PA-C 10/20/23 2044    Malvina Alm DASEN, MD 10/21/23 [phone]

## 2023-10-21 ENCOUNTER — Encounter: Payer: Self-pay | Admitting: Oncology

## 2023-10-27 ENCOUNTER — Encounter: Payer: Self-pay | Admitting: Oncology

## 2024-03-04 ENCOUNTER — Encounter (HOSPITAL_COMMUNITY): Payer: Self-pay

## 2024-03-04 ENCOUNTER — Emergency Department (HOSPITAL_COMMUNITY): Payer: Self-pay

## 2024-03-04 ENCOUNTER — Emergency Department (HOSPITAL_COMMUNITY): Payer: Worker's Compensation

## 2024-03-04 ENCOUNTER — Emergency Department (HOSPITAL_COMMUNITY)
Admission: EM | Admit: 2024-03-04 | Discharge: 2024-03-04 | Disposition: A | Discharge status: Home / Self Care | Payer: Worker's Compensation | Attending: Emergency Medicine | Admitting: Emergency Medicine

## 2024-03-04 ENCOUNTER — Other Ambulatory Visit: Payer: Self-pay

## 2024-03-04 ENCOUNTER — Encounter: Payer: Self-pay | Admitting: Oncology

## 2024-03-04 DIAGNOSIS — M25512 Pain in left shoulder: Secondary | ICD-10-CM | POA: Insufficient documentation

## 2024-03-04 DIAGNOSIS — R55 Syncope and collapse: Secondary | ICD-10-CM | POA: Diagnosis not present

## 2024-03-04 DIAGNOSIS — R42 Dizziness and giddiness: Secondary | ICD-10-CM | POA: Diagnosis not present

## 2024-03-04 DIAGNOSIS — K257 Chronic gastric ulcer without hemorrhage or perforation: Secondary | ICD-10-CM

## 2024-03-04 DIAGNOSIS — R109 Unspecified abdominal pain: Secondary | ICD-10-CM | POA: Insufficient documentation

## 2024-03-04 DIAGNOSIS — R1013 Epigastric pain: Secondary | ICD-10-CM

## 2024-03-04 LAB — COMPREHENSIVE METABOLIC PANEL WITH GFR
ALT: 7 U/L (ref 0–44)
AST: 13 U/L — ABNORMAL LOW (ref 15–41)
Albumin: 3.2 g/dL — ABNORMAL LOW (ref 3.5–5.0)
Alkaline Phosphatase: 50 U/L (ref 38–126)
Anion gap: 8 (ref 5–15)
BUN: 11 mg/dL (ref 6–20)
CO2: 21 mmol/L — ABNORMAL LOW (ref 22–32)
Calcium: 8.1 mg/dL — ABNORMAL LOW (ref 8.9–10.3)
Chloride: 106 mmol/L (ref 98–111)
Creatinine, Ser: 0.56 mg/dL (ref 0.44–1.00)
GFR, Estimated: >60 mL/min (ref >60)
Glucose, Bld: 86 mg/dL (ref 70–99)
Potassium: 3.1 mmol/L — ABNORMAL LOW (ref 3.5–5.1)
Sodium: 135 mmol/L (ref 135–145)
Total Bilirubin: 0.8 mg/dL (ref 0.0–1.2)
Total Protein: 5.9 g/dL — ABNORMAL LOW (ref 6.5–8.1)

## 2024-03-04 LAB — CBC WITH DIFFERENTIAL/PLATELET
Abs Immature Granulocytes: 0.05 10*3/uL (ref 0.00–0.07)
Basophils Absolute: 0 10*3/uL (ref 0.0–0.1)
Basophils Relative: 0 %
Eosinophils Absolute: 0.1 10*3/uL (ref 0.0–0.5)
Eosinophils Relative: 1 %
HCT: 28.6 % — ABNORMAL LOW (ref 36.0–46.0)
Hemoglobin: 8.8 g/dL — ABNORMAL LOW (ref 12.0–15.0)
Immature Granulocytes: 1 %
Lymphocytes Relative: 25 %
Lymphs Abs: 1.2 10*3/uL (ref 0.7–4.0)
MCH: 26.4 pg (ref 26.0–34.0)
MCHC: 30.8 g/dL (ref 30.0–36.0)
MCV: 85.9 fL (ref 80.0–100.0)
Monocytes Absolute: 0.2 10*3/uL (ref 0.1–1.0)
Monocytes Relative: 4 %
Neutro Abs: 3.2 10*3/uL (ref 1.7–7.7)
Neutrophils Relative %: 69 %
Platelets: 240 10*3/uL (ref 150–400)
RBC: 3.33 MIL/uL — ABNORMAL LOW (ref 3.87–5.11)
RDW: 18.7 % — ABNORMAL HIGH (ref 11.5–15.5)
WBC: 4.6 10*3/uL (ref 4.0–10.5)
nRBC: 0 % (ref 0.0–0.2)

## 2024-03-04 LAB — LIPASE, BLOOD: Lipase: 19 U/L (ref 11–51)

## 2024-03-04 LAB — OCCULT BLOOD, POC DEVICE: Fecal Occult Bld: NEGATIVE

## 2024-03-04 LAB — MAGNESIUM: Magnesium: 1.9 mg/dL (ref 1.7–2.4)

## 2024-03-04 LAB — TROPONIN I (HIGH SENSITIVITY): Troponin I (High Sensitivity): 3 ng/L (ref <18)

## 2024-03-04 MED ORDER — FENTANYL CITRATE PF 50 MCG/ML IJ SOSY: 50.0000 ug | PREFILLED_SYRINGE | Freq: Once | INTRAMUSCULAR | Status: DC

## 2024-03-04 MED ORDER — ALUM & MAG HYDROXIDE-SIMETH 200-200-20 MG/5ML PO SUSP: 30.0000 mL | Freq: Once | ORAL | Status: DC

## 2024-03-04 MED ORDER — POTASSIUM CHLORIDE CRYS ER 20 MEQ PO TBCR
40.0000 meq | EXTENDED_RELEASE_TABLET | Freq: Once | ORAL | Status: DC
  Filled 2024-03-04: qty 2

## 2024-03-04 MED ORDER — SUCRALFATE 1 G PO TABS: 1.0000 g | ORAL_TABLET | Freq: Three times a day (TID) | ORAL | 0 refills | Status: AC

## 2024-03-04 MED ORDER — PANTOPRAZOLE SODIUM 40 MG IV SOLR
40.0000 mg | Freq: Once | INTRAVENOUS | Status: DC
  Filled 2024-03-04: qty 10

## 2024-03-04 MED ORDER — OMEPRAZOLE 40 MG PO CPDR: 40.0000 mg | DELAYED_RELEASE_CAPSULE | Freq: Two times a day (BID) | ORAL | 2 refills | Status: AC

## 2024-03-04 MED ORDER — LIDOCAINE VISCOUS HCL 2 % MT SOLN: 15.0000 mL | Freq: Once | OROMUCOSAL | Status: DC

## 2024-03-04 MED ORDER — SODIUM CHLORIDE 0.9 % IV BOLUS
1000.0000 mL | Freq: Once | INTRAVENOUS | Status: AC
  Administered 2024-03-04: 1000 mL via INTRAVENOUS

## 2024-03-04 MED ORDER — POTASSIUM CHLORIDE CRYS ER 20 MEQ PO TBCR: 20.0000 meq | EXTENDED_RELEASE_TABLET | Freq: Two times a day (BID) | ORAL | 0 refills | Status: AC

## 2024-03-04 MED ORDER — IOHEXOL 300 MG/ML  SOLN
100.0000 mL | Freq: Once | INTRAMUSCULAR | Status: AC | PRN
  Administered 2024-03-04: 100 mL via INTRAVENOUS

## 2024-03-04 NOTE — ED Triage Notes (Addendum)
 Pt BIB EMS from Work due to unwitnessed LOC. Pt Syncope in bathroom staff heard fall and came the pt aid called EMS. Pt was lethargic, dizzy, nauseous on scene. Pt did fall to ground unclear if pt hit her head, no blood thinners, no pain in head, or other visible injuries on body. Pt c/o Left shoulder pain and generalized abdominal pain. Hx of stomach ulcer. Last BM was 3 days ago. Pt report past Hx of syncope due to dehydration. AAOx4  BP 99/60 HR 65 SpO2 100 CBG 96 1500 mL NaCl 4 mg Zofran  18 ga IV RAC

## 2024-03-04 NOTE — ED Provider Notes (Signed)
 Care of patient assumed from Dr. Zammit.  This patient presents after a syncopal episode.  She had a prodrome of dizziness.  This is in the setting of some recent GI upset.  She has a history of PUD.  She did have brown heme positive stools today with no concerning drop in hemoglobin.  She is waiting for CT imaging.  Physical Exam  BP 94/65 (BP Location: Left Arm)   Pulse 61   Temp 97.6 F (36.4 C) (Oral)   Resp 12   LMP 04/25/2016 (Exact Date)   SpO2 100%   Physical Exam Vitals and nursing note reviewed.  Constitutional:      General: She is not in acute distress.    Appearance: She is well-developed. She is not ill-appearing, toxic-appearing or diaphoretic.  HENT:     Head: Normocephalic and atraumatic.     Mouth/Throat:     Mouth: Mucous membranes are moist.   Eyes:     Conjunctiva/sclera: Conjunctivae normal.    Cardiovascular:     Rate and Rhythm: Normal rate and regular rhythm.  Pulmonary:     Effort: Pulmonary effort is normal. No respiratory distress.  Abdominal:     General: Abdomen is flat.   Musculoskeletal:        General: No swelling.     Cervical back: Neck supple.   Skin:    General: Skin is warm and dry.   Neurological:     General: No focal deficit present.     Mental Status: She is alert and oriented to person, place, and time.   Psychiatric:        Mood and Affect: Mood normal.        Behavior: Behavior normal.     Procedures  Procedures  ED Course / MDM    Medical Decision Making Amount and/or Complexity of Data Reviewed Labs: ordered. Radiology: ordered. ECG/medicine tests: ordered.  Risk OTC drugs. Prescription drug management.   On assessment, patient resting comfortably.  She does have some ongoing epigastric pain.  GI cocktail and fentanyl  were ordered.  CT scan showed questionable enteritis without any other acute findings.  Patient requested to leave due to availability of ride home.  Given reassuring workup, this is  reasonable.  Patient advised to follow-up with her GI doctor.  She was discharged in stable condition.       Melvenia Motto, MD 03/04/24 [phone]

## 2024-03-04 NOTE — Discharge Instructions (Addendum)
 Resume taking omeprazole  and sucralfate  daily.  Take potassium supplement for the next 3 days.  Prescriptions were sent to your pharmacy.  Follow-up with your gastroenterologist.  Return to the emergency department for any new or worsening symptoms of concern.

## 2024-03-04 NOTE — ED Provider Notes (Signed)
 [place] EMERGENCY DEPARTMENT AT [facility] Provider Note   CSN: [id number] Arrival date & time: 03/04/24  1325     Patient presents with: Loss of Consciousness, Abdominal Pain, and Shoulder Pain (Left)   SOUMYA [name] is a 48 y.o. female.  {Add pertinent medical, surgical, social history, OB history to [id number]} Patient states that she had dizziness and passed out.  Patient complains of left shoulder pain and some abdominal cramps  The history is provided by the patient and medical records. No language interpreter was used.  Loss of Consciousness Episode history:  Single Most recent episode:  Today Timing:  Constant Progression:  Waxing and waning Chronicity:  New Witnessed: no   Relieved by:  Nothing Worsened by:  Nothing Ineffective treatments:  None tried Associated symptoms: no chest pain, no headaches and no seizures   Abdominal Pain Associated symptoms: no chest pain, no cough, no diarrhea, no fatigue and no hematuria   Shoulder Pain Associated symptoms: no back pain and no fatigue        Prior to Admission medications   Medication Sig Start Date End Date Taking? Authorizing Provider  acetaminophen  (TYLENOL ) 500 MG tablet Take 2 tablets (1,000 mg total) by mouth every 6 (six) hours. 10/24/20   Ward, Mitzie BROCKS, MD  albuterol  (PROVENTIL  HFA;VENTOLIN  HFA) 108 (90 Base) MCG/ACT inhaler Inhale 2 puffs into the lungs every 6 (six) hours as needed for wheezing or shortness of breath.    [provider]  Ascorbic Acid  (VITAMIN C PO) Take 1 tablet by mouth daily.    [provider]  bisacodyl  (DULCOLAX) 10 MG suppository Place 1 suppository (10 mg total) rectally daily as needed for moderate constipation. 10/24/20   Ward, Mitzie BROCKS, MD  cyclobenzaprine  (FLEXERIL ) 10 MG tablet Take 1 tablet (10 mg total) by mouth 3 (three) times daily as needed. 10/20/23   Gasper Devere ORN, PA-C  dicyclomine  (BENTYL ) 10 MG capsule Take 10 mg by mouth 3  (three) times daily as needed for spasms. 10/06/20   [provider]  diphenhydrAMINE  (BENADRYL ) 25 MG tablet Take 25 mg by mouth 2 (two) times daily as needed for allergies.    [provider]  docusate sodium  (COLACE) 100 MG capsule Take 1 capsule (100 mg total) by mouth 2 (two) times daily. To keep stools soft Patient taking differently: Take 100 mg by mouth daily as needed. To keep stools soft 10/11/20   Verdon Keen, MD  fluconazole  (DIFLUCAN ) 150 MG tablet Take 1 tablet (150 mg total) by mouth daily. -For your yeast infection, start the Diflucan  (fluconazole )- Take one pill today (day 1). If you're still having symptoms in 3 days, take the second pill. 10/10/21   Graham, Laura E, PA-C  fluticasone (FLONASE) 50 MCG/ACT nasal spray Place 1 spray into both nostrils daily as needed for allergies or rhinitis.    [provider]  gabapentin  (NEURONTIN ) 600 MG tablet Take 1,200 mg by mouth 2 (two) times daily. 10/08/21   [provider]  magnesium  oxide (MAG-OX) 400 MG tablet Take 400 mg by mouth daily.    [provider]  metroNIDAZOLE  (FLAGYL ) 500 MG tablet Take 1 tablet (500 mg total) by mouth 2 (two) times daily. 10/11/21   LampteyAleene KIDD, MD  Multiple Vitamins-Minerals (WOMENS DAILY FORMULA PO) Take 1 tablet by mouth daily.    [provider]  naloxone  (NARCAN ) nasal spray 4 mg/0.1 mL CALL 911. ADMINISTER A SINGLE SPRAY OF NARCAN  IN ONE NOSTRIL. REPEAT  EVERY 3 MINUTES AS NEEDED IF NO OR MINIMAL RESPONSE. 10/14/20   [provider]  NIFEdipine  (ADALAT  CC) 30 MG 24 hr tablet Take 1 tablet by mouth daily. 10/25/20   [provider]  omeprazole  (PRILOSEC) 40 MG capsule Take 1 capsule (40 mg total) by mouth in the morning and at bedtime. 09/06/22 12/05/22  Suzanne Kirsch, MD  ondansetron  (ZOFRAN -ODT) 4 MG disintegrating tablet Take 1 tablet (4 mg total) by mouth every 8 (eight) hours as needed for nausea or vomiting. 09/06/22   Suzanne Kirsch, MD  oxyCODONE  (OXY IR/ROXICODONE ) 5 MG immediate release tablet Take by mouth. 01/31/21   [provider]  oxyCODONE -acetaminophen  (PERCOCET) 10-325 MG tablet Take 1 tablet by mouth 5 (five) times daily as needed. 08/24/22   [provider]  polyethylene glycol (MIRALAX  / GLYCOLAX ) packet Take 17 g by mouth daily as needed for mild constipation.     [provider]  Potassium Chloride  ER 20 MEQ TBCR Take 20 mEq by mouth daily. 01/17/21   Rao, Archana C, MD  potassium chloride  SA (KLOR-CON  M) 20 MEQ tablet Take 1 tablet (20 mEq total) by mouth daily for 3 days. 09/06/22 09/09/22  Suzanne Kirsch, MD  promethazine  (PHENERGAN ) 25 MG suppository Place 1 suppository (25 mg total) rectally every 8 (eight) hours as needed for refractory nausea / vomiting. 01/14/21   Angelena Smalls, MD  sucralfate  (CARAFATE ) 1 g tablet Take 1 tablet (1 g total) by mouth 4 (four) times daily -  with meals and at bedtime. 09/06/22 10/06/22  Immordino, Garnette, FNP  Turmeric (QC TUMERIC COMPLEX) 500 MG CAPS Take 450 mg by mouth in the morning and at bedtime. 10/24/20   Ward, Mitzie BROCKS, MD  Vitamin D, Ergocalciferol, (DRISDOL) 1.25 MG (50000 UNIT) CAPS capsule Take 50,000 Units by mouth once a week. 07/23/22   [provider]    Allergies: Lisinopril , Aspirin, Ibuprofen, and Other    Review of Systems  Constitutional:  Negative for appetite change and fatigue.  HENT:  Negative for congestion, ear discharge and sinus pressure.   Eyes:  Negative for discharge.  Respiratory:  Negative for cough.   Cardiovascular:  Positive for syncope. Negative for chest pain.  Gastrointestinal:  Positive for abdominal pain. Negative for diarrhea.  Genitourinary:  Negative for frequency and hematuria.  Musculoskeletal:  Negative for back pain.  Skin:  Negative for rash.  Neurological:  Negative for seizures and headaches.  Psychiatric/Behavioral:  Negative for hallucinations.     Updated Vital  Signs BP 94/65 (BP Location: Left Arm)   Pulse 61   Temp 97.6 F (36.4 C) (Oral)   Resp 12   LMP 04/25/2016 (Exact Date)   SpO2 100%   Physical Exam Vitals and nursing note reviewed.  Constitutional:      Appearance: She is well-developed.  HENT:     Head: Normocephalic.     Nose: Nose normal.   Eyes:     General: No scleral icterus.    Conjunctiva/sclera: Conjunctivae normal.   Neck:     Thyroid : No thyromegaly.   Cardiovascular:     Rate and Rhythm: Normal rate and regular rhythm.     Heart sounds: No murmur heard.    No friction rub. No gallop.  Pulmonary:     Breath sounds: No stridor. No wheezing or rales.  Chest:     Chest wall: No tenderness.  Abdominal:     General: There is no distension.     Tenderness: There is  no abdominal tenderness. There is no rebound.  Genitourinary:    Comments: Brown stool heme positive  Musculoskeletal:     Cervical back: Neck supple.     Comments: Tender left shoulder  Lymphadenopathy:     Cervical: No cervical adenopathy.   Skin:    Findings: No erythema or rash.   Neurological:     Mental Status: She is alert and oriented to person, place, and time.     Motor: No abnormal muscle tone.     Coordination: Coordination normal.   Psychiatric:        Behavior: Behavior normal.     (all labs ordered are listed, but only abnormal results are displayed) Labs Reviewed  CBC WITH DIFFERENTIAL/PLATELET - Abnormal; Notable for the following components:      Result Value   RBC 3.33 (*)    Hemoglobin 8.8 (*)    HCT 28.6 (*)    RDW 18.7 (*)    All other components within normal limits  COMPREHENSIVE METABOLIC PANEL WITH GFR - Abnormal; Notable for the following components:   Potassium 3.1 (*)    CO2 21 (*)    Calcium 8.1 (*)    Total Protein 5.9 (*)    Albumin  3.2 (*)    AST 13 (*)    All other components within normal limits  LIPASE, BLOOD    EKG: None  Radiology: No results found.  {Document cardiac monitor,  telemetry assessment procedure when appropriate:32947} Procedures   Medications Ordered in the ED  sodium chloride  0.9 % bolus 1,000 mL (1,000 mLs Intravenous New Bag/Given 03/04/24 1426)      {Click here for ABCD2, HEART and other calculators REFRESH Note before signing:1}                              Medical Decision Making Amount and/or Complexity of Data Reviewed Labs: ordered. Radiology: ordered. ECG/medicine tests: ordered.  Risk Prescription drug management.   Syncope  {Document critical care time when appropriate  Document review of labs and clinical decision tools ie CHADS2VASC2, etc  Document your independent review of radiology images and any outside records  Document your discussion with family members, caretakers and with consultants  Document social determinants of health affecting pt's care  Document your decision making why or why not admission, treatments were needed:32947:::1}   Final diagnoses:  None    ED Discharge Orders     None

## 2024-03-05 ENCOUNTER — Encounter: Payer: Self-pay | Admitting: Oncology

## 2024-08-12 ENCOUNTER — Other Ambulatory Visit: Payer: Self-pay | Admitting: Family Medicine

## 2024-08-12 DIAGNOSIS — Z1231 Encounter for screening mammogram for malignant neoplasm of breast: Secondary | ICD-10-CM
